# Patient Record
Sex: Male | Born: 1970 | Race: Black or African American | Hispanic: No | Marital: Married
Health system: Southern US, Community
[De-identification: ages and names within clinical notes are randomized; demographics above are authoritative.]

## PROBLEM LIST (undated history)

## (undated) DIAGNOSIS — R569 Unspecified convulsions: Secondary | ICD-10-CM

## (undated) DIAGNOSIS — R251 Tremor, unspecified: Secondary | ICD-10-CM

## (undated) DIAGNOSIS — I10 Essential (primary) hypertension: Secondary | ICD-10-CM

## (undated) DIAGNOSIS — F1020 Alcohol dependence, uncomplicated: Secondary | ICD-10-CM

## (undated) HISTORY — PX: OTHER SURGICAL HISTORY: SHX169

## (undated) HISTORY — PX: HAND SURGERY: SHX662

## (undated) HISTORY — PX: KNEE SURGERY: SHX244

---

## 2004-11-25 ENCOUNTER — Emergency Department (HOSPITAL_COMMUNITY): Admission: EM | Admit: 2004-11-25 | Discharge: 2004-11-25 | Payer: Self-pay | Admitting: Emergency Medicine

## 2005-11-08 ENCOUNTER — Emergency Department (HOSPITAL_COMMUNITY): Admission: EM | Admit: 2005-11-08 | Discharge: 2005-11-08 | Payer: Self-pay | Admitting: *Deleted

## 2005-11-09 ENCOUNTER — Emergency Department (HOSPITAL_COMMUNITY): Admission: EM | Admit: 2005-11-09 | Discharge: 2005-11-10 | Payer: Self-pay | Admitting: Emergency Medicine

## 2011-04-06 ENCOUNTER — Emergency Department (HOSPITAL_COMMUNITY)
Admission: EM | Admit: 2011-04-06 | Discharge: 2011-04-06 | Disposition: A | Discharge status: Home / Self Care | Payer: Self-pay | Attending: Emergency Medicine | Admitting: Emergency Medicine

## 2011-04-06 DIAGNOSIS — L989 Disorder of the skin and subcutaneous tissue, unspecified: Secondary | ICD-10-CM | POA: Insufficient documentation

## 2012-03-16 ENCOUNTER — Encounter (HOSPITAL_COMMUNITY): Payer: Self-pay | Admitting: *Deleted

## 2012-03-16 ENCOUNTER — Emergency Department (HOSPITAL_COMMUNITY)
Admission: EM | Admit: 2012-03-16 | Discharge: 2012-03-17 | Disposition: A | Discharge status: Rehabilitation Facility | Payer: Self-pay | Attending: Emergency Medicine | Admitting: Emergency Medicine

## 2012-03-16 DIAGNOSIS — F10929 Alcohol use, unspecified with intoxication, unspecified: Secondary | ICD-10-CM

## 2012-03-16 DIAGNOSIS — F1010 Alcohol abuse, uncomplicated: Secondary | ICD-10-CM | POA: Insufficient documentation

## 2012-03-16 HISTORY — DX: Alcohol dependence, uncomplicated: F10.20

## 2012-03-16 LAB — COMPREHENSIVE METABOLIC PANEL
ALT: 27 U/L (ref 0–53)
AST: 34 U/L (ref 0–37)
Albumin: 4.4 g/dL (ref 3.5–5.2)
Alkaline Phosphatase: 99 U/L (ref 39–117)
BUN: 9 mg/dL (ref 6–23)
CO2: 21 mEq/L (ref 19–32)
Calcium: 9.1 mg/dL (ref 8.4–10.5)
Chloride: 100 mEq/L (ref 96–112)
Creatinine, Ser: 1.03 mg/dL (ref 0.50–1.35)
GFR calc Af Amer: >90 mL/min (ref >90)
GFR calc non Af Amer: 88 mL/min — ABNORMAL LOW (ref >90)
Glucose, Bld: 92 mg/dL (ref 70–99)
Potassium: 4.2 mEq/L (ref 3.5–5.1)
Sodium: 140 mEq/L (ref 135–145)
Total Bilirubin: 0.2 mg/dL — ABNORMAL LOW (ref 0.3–1.2)
Total Protein: 8.3 g/dL (ref 6.0–8.3)

## 2012-03-16 LAB — CBC
HCT: 44.7 % (ref 39.0–52.0)
Hemoglobin: 15.2 g/dL (ref 13.0–17.0)
MCH: 28.9 pg (ref 26.0–34.0)
MCHC: 34 g/dL (ref 30.0–36.0)
MCV: 85 fL (ref 78.0–100.0)
Platelets: 357 10*3/uL (ref 150–400)
RBC: 5.26 MIL/uL (ref 4.22–5.81)
RDW: 13.5 % (ref 11.5–15.5)
WBC: 8.4 10*3/uL (ref 4.0–10.5)

## 2012-03-16 LAB — RAPID URINE DRUG SCREEN, HOSP PERFORMED
Amphetamines: NOT DETECTED
Barbiturates: NOT DETECTED
Benzodiazepines: NOT DETECTED
Cocaine: POSITIVE — AB
Opiates: NOT DETECTED
Tetrahydrocannabinol: POSITIVE — AB

## 2012-03-16 LAB — ACETAMINOPHEN LEVEL: Acetaminophen (Tylenol), Serum: <15 ug/mL (ref 10–30)

## 2012-03-16 LAB — ETHANOL: Alcohol, Ethyl (B): 271 mg/dL — ABNORMAL HIGH (ref 0–11)

## 2012-03-16 NOTE — ED Provider Notes (Signed)
History    41 year old male with a history of long-standing alcohol abuse. He is in today requesting detox. He says that "enough is enough." Denies any acute stressors which prompted to come to the ER today. Denies any drug use aside from occasional marijuana. No suicidal or homicidal ideation. States that he has drank almost daily for 15 years. No history of seizures when he tries stopping. Last drink was shortly before arrival.  CSN: [id number]  Arrival date & time 03/16/12  1916   First MD Initiated Contact with Patient 03/16/12 2052      Chief Complaint  Patient presents with  . Alcohol Intoxication    detox ,  last drink one hour ago    (Consider location/radiation/quality/duration/timing/severity/associated sxs/prior treatment) HPI  Past Medical History  Diagnosis Date  . Alcoholic     Past Surgical History  Procedure Date  . Hand surgery   . Knee surgery   . Dislocated hip     History reviewed. No pertinent family history.  History  Substance Use Topics  . Smoking status: Not on file  . Smokeless tobacco: Not on file  . Alcohol Use: Yes     5th alcohol for at least 15 years      Review of Systems   Review of symptoms negative unless otherwise noted in HPI.   Allergies  Review of patient's allergies indicates no known allergies.  Home Medications  No current outpatient prescriptions on file.  BP 141/87  Pulse 91  Temp(Src) 98.2 F (36.8 C) (Oral)  Resp 20  SpO2 97%  Physical Exam  Nursing note and vitals reviewed. Constitutional: He is oriented to person, place, and time. He appears well-developed and well-nourished. No distress.       Sitting up in bed. Acute distress. Alcohol and breath.  HENT:  Head: Normocephalic and atraumatic.  Eyes: Conjunctivae are normal. Pupils are equal, round, and reactive to light. Right eye exhibits no discharge. Left eye exhibits no discharge.  Neck: Neck supple.  Cardiovascular: Normal rate, regular rhythm and  normal heart sounds.  Exam reveals no gallop and no friction rub.   No murmur heard. Pulmonary/Chest: Effort normal and breath sounds normal. No respiratory distress.  Abdominal: Soft. He exhibits no distension. There is no tenderness.  Musculoskeletal: He exhibits no edema and no tenderness.  Neurological: He is alert and oriented to person, place, and time. No cranial nerve deficit. He exhibits normal muscle tone. Coordination normal.       Speech is clear and content is appropriate. Good heel to shin and finger nose testing bilaterally. Gait is steady.  Skin: Skin is warm and dry. He is not diaphoretic.  Psychiatric: He has a normal mood and affect. His behavior is normal. Thought content normal.    ED Course  Procedures (including critical care time)  Labs Reviewed  COMPREHENSIVE METABOLIC PANEL - Abnormal; Notable for the following:    Total Bilirubin 0.2 (*)    GFR calc non Af Amer 88 (*)    All other components within normal limits  ETHANOL - Abnormal; Notable for the following:    Alcohol, Ethyl (B) 271 (*)    All other components within normal limits  URINE RAPID DRUG SCREEN (HOSP PERFORMED) - Abnormal; Notable for the following:    Cocaine POSITIVE (*)    Tetrahydrocannabinol POSITIVE (*)    All other components within normal limits  CBC  ACETAMINOPHEN LEVEL   No results found.   1. Alcohol abuse   2.  Alcohol intoxication       MDM  41 year old male with alcohol abuse and acute intoxication. No SI or HI. No evidence of psychosis. No indication to involuntarily commitment.  Discussed with [facility]. Will eval pt.        Raeford Razor, MD 03/16/12 [phone]

## 2012-03-17 MED ORDER — LORAZEPAM 1 MG PO TABS
1.0000 mg | ORAL_TABLET | Freq: Four times a day (QID) | ORAL | Status: DC | PRN
  Administered 2012-03-17: 1 mg via ORAL
  Filled 2012-03-17: qty 1

## 2012-03-17 MED ORDER — THIAMINE HCL 100 MG/ML IJ SOLN: 100.0000 mg | Freq: Every day | INTRAMUSCULAR | Status: DC

## 2012-03-17 MED ORDER — ALUM & MAG HYDROXIDE-SIMETH 200-200-20 MG/5ML PO SUSP: 30.0000 mL | ORAL | Status: DC | PRN

## 2012-03-17 MED ORDER — LORAZEPAM 1 MG PO TABS: 0.0000 mg | ORAL_TABLET | Freq: Two times a day (BID) | ORAL | Status: DC

## 2012-03-17 MED ORDER — ZOLPIDEM TARTRATE 5 MG PO TABS: 5.0000 mg | ORAL_TABLET | Freq: Every evening | ORAL | Status: DC | PRN

## 2012-03-17 MED ORDER — IBUPROFEN 600 MG PO TABS: 600.0000 mg | ORAL_TABLET | Freq: Three times a day (TID) | ORAL | Status: DC | PRN

## 2012-03-17 MED ORDER — NICOTINE 21 MG/24HR TD PT24: 21.0000 mg | MEDICATED_PATCH | Freq: Every day | TRANSDERMAL | Status: DC

## 2012-03-17 MED ORDER — LORAZEPAM 1 MG PO TABS: 1.0000 mg | ORAL_TABLET | Freq: Three times a day (TID) | ORAL | Status: DC | PRN

## 2012-03-17 MED ORDER — ADULT MULTIVITAMIN W/MINERALS CH
1.0000 | ORAL_TABLET | Freq: Every day | ORAL | Status: DC
  Administered 2012-03-17: 1 via ORAL
  Filled 2012-03-17: qty 1

## 2012-03-17 MED ORDER — ONDANSETRON HCL 4 MG PO TABS: 4.0000 mg | ORAL_TABLET | Freq: Three times a day (TID) | ORAL | Status: DC | PRN

## 2012-03-17 MED ORDER — VITAMIN B-1 100 MG PO TABS
100.0000 mg | ORAL_TABLET | Freq: Every day | ORAL | Status: DC
  Administered 2012-03-17: 100 mg via ORAL
  Filled 2012-03-17: qty 1

## 2012-03-17 MED ORDER — FOLIC ACID 1 MG PO TABS
1.0000 mg | ORAL_TABLET | Freq: Every day | ORAL | Status: DC
  Administered 2012-03-17: 1 mg via ORAL
  Filled 2012-03-17: qty 1

## 2012-03-17 MED ORDER — LORAZEPAM 1 MG PO TABS
0.0000 mg | ORAL_TABLET | Freq: Four times a day (QID) | ORAL | Status: DC
  Administered 2012-03-17: 1 mg via ORAL
  Filled 2012-03-17: qty 1

## 2012-03-17 MED ORDER — LORAZEPAM 2 MG/ML IJ SOLN: 1.0000 mg | Freq: Four times a day (QID) | INTRAMUSCULAR | Status: DC | PRN

## 2012-03-17 NOTE — ED Notes (Signed)
Pt discharged with wife. He is to go to [facility] directly from here. Denies SI/HI. Report given to nurse at [facility]. Instructions reviewed with pt and he verbalizes understanding.

## 2012-03-17 NOTE — ED Notes (Signed)
Pt reports some nausea and hot flashes.  Pt declined medication for nausea at this time, po fluids encouraged

## 2012-03-17 NOTE — ED Notes (Signed)
Spoke with Delice Bison at [facility]. She stated they would accept pt after their shift change this evening. They request for him to arrive around 8 p.m. Pt is to have his wife drive him there immediately after discharge from here. He agrees to go directly there. Bobby with [facility] to give him directions.

## 2012-03-17 NOTE — Discharge Instructions (Signed)
Go to [facility]

## 2012-03-17 NOTE — ED Notes (Signed)
Up to the bathroom 

## 2012-03-17 NOTE — ED Notes (Addendum)
Pt's daughter brought him new, unopened denture cream. Cream placed in pt's medication drawer.

## 2012-03-17 NOTE — BH Assessment (Signed)
Assessment Note   Xavier Owens is a 41 y.o. male who presents to [facility] voluntarily, requesting detox from alcohol. Pt states he drinks 1 pint to 1/5th Vodka daily. Pt reports he has been drinking "longer than you've (referring to this Clinical research associate) been alive." He then clarified he has been drinking daily for at least 15 years. He states he has had a "a few sober days" in that time. He does not identify any current stressors or reason for seeking treatment today. He states he "needs to stop" and "its killing me." Pt reports that he is motivated to change. He reports no prior detox attempts and no prior mental health treatment. He admits to occasional THC use but denies any other SA.  Pt denies any current or past SI, HI, and AHVH. He endorses some depression, stating he sometimes feels hopeless. Pt reports that he lives with a friend and can return current living situation after treatment. He states his friend and daughter are very supportive and will be supportive of him seeking treatment.   Axis I: Alcohol Dependence Axis II: Deferred Axis III:  Past Medical History  Diagnosis Date  . Alcoholic    Axis IV: other psychosocial or environmental problems Axis V: 41-50 serious symptoms  Past Medical History:  Past Medical History  Diagnosis Date  . Alcoholic     Past Surgical History  Procedure Date  . Hand surgery   . Knee surgery   . Dislocated hip     Family History: History reviewed. No pertinent family history.  Social History:  does not have a smoking history on file. He does not have any smokeless tobacco history on file. He reports that he drinks alcohol. He reports that he uses illicit drugs (Marijuana).  Additional Social History:  Alcohol / Drug Use History of alcohol / drug use?: Yes Substance #1 Name of Substance 1: alcohol 1 - Age of First Use: teens 1 - Amount (size/oz): 1 pint to 1/5th of Vodka 1 - Frequency: daily 1 - Duration: states at least 15 years 1 - Last  Use / Amount: 03/16/12 Substance #2 Name of Substance 2: THC 2 - Frequency: occasional use 2 - Duration: reports years Substance #3 Name of Substance 3: cocaine 3 - Frequency: pt denies but UDS is positive Allergies: No Known Allergies  Home Medications:  (Not in a hospital admission)  OB/GYN Status:  No LMP for male patient.  General Assessment Data Location of Assessment: [facility] ED Living Arrangements: Non-relatives/Friends Can pt return to current living arrangement?: Yes Admission Status: Voluntary Is patient capable of signing voluntary admission?: Yes Transfer from: Acute Hospital Referral Source: Self/Family/Friend  Education Status Is patient currently in school?: No Highest grade of school patient has completed: 11  Risk to self Suicidal Ideation: No Suicidal Intent: No Is patient at risk for suicide?: No Suicidal Plan?: No Access to Means: No What has been your use of drugs/alcohol within the last 12 months?: alcohol, THC, and cocaine Previous Attempts/Gestures: No How many times?: 0  Other Self Harm Risks: none Triggers for Past Attempts: None known Intentional Self Injurious Behavior: None Family Suicide History: No Recent stressful life event(s):  (none known) Persecutory voices/beliefs?: No Depression: Yes Depression Symptoms: Loss of interest in usual pleasures Substance abuse history and/or treatment for substance abuse?: Yes Suicide prevention information given to non-admitted patients: Not applicable  Risk to Others Homicidal Ideation: No Thoughts of Harm to Others: No Current Homicidal Intent: No Current Homicidal Plan: No Access to Homicidal  Means: No Identified Victim: none History of harm to others?: No Assessment of Violence: None Noted Violent Behavior Description: cooperative Does patient have access to weapons?: No Criminal Charges Pending?: No Does patient have a court date: No  Psychosis Hallucinations: None noted Delusions: None  noted  Mental Status Report Appear/Hygiene: Disheveled Eye Contact: Good Motor Activity: Unremarkable Speech: Logical/coherent Level of Consciousness: Quiet/awake Mood: Anxious Affect: Appropriate to circumstance Anxiety Level: None Thought Processes: Coherent;Relevant Judgement: Impaired Orientation: Person;Place;Time;Situation Obsessive Compulsive Thoughts/Behaviors: None  Cognitive Functioning Concentration: Normal Memory: Recent Intact;Remote Intact IQ: Average Insight: Poor Impulse Control: Poor Appetite: Fair Weight Loss: 0  Weight Gain: 0  Sleep: Decreased Vegetative Symptoms: None  Prior Inpatient Therapy Prior Inpatient Therapy: No Prior Therapy Dates: n/a Prior Therapy Facilty/Provider(s): n/a Reason for Treatment: n/a  Prior Outpatient Therapy Prior Outpatient Therapy: No Prior Therapy Dates: n/a Prior Therapy Facilty/Provider(s): n/a Reason for Treatment: n/a  ADL Screening (condition at time of admission) Patient's cognitive ability adequate to safely complete daily activities?: Yes Patient able to express need for assistance with ADLs?: Yes Independently performs ADLs?: Yes Weakness of Legs: None Weakness of Arms/Hands: None  Home Assistive Devices/Equipment Home Assistive Devices/Equipment: None    Abuse/Neglect Assessment (Assessment to be complete while patient is alone) Physical Abuse: Denies Verbal Abuse: Denies Sexual Abuse: Denies Exploitation of patient/patient's resources: Denies Self-Neglect: Denies Values / Beliefs Cultural Requests During Hospitalization: None Spiritual Requests During Hospitalization: None   Advance Directives (For Healthcare) Advance Directive: Patient does not have advance directive;Patient would not like information Pre-existing out of facility DNR order (yellow form or pink MOST form): No Nutrition Screen Diet: Regular Unintentional weight loss greater than 10lbs within the last month: No Problems  chewing or swallowing foods and/or liquids: No Home Tube Feeding or Total Parenteral Nutrition (TPN): No Patient appears severely malnourished: No  Additional Information 1:1 In Past 12 Months?: No CIRT Risk: No Elopement Risk: No Does patient have medical clearance?: Yes     Disposition:  Disposition Disposition of Patient: Referred to;Inpatient treatment program Type of inpatient treatment program: Adult  On Site Evaluation by:   Reviewed with Physician:     Georgina Quint A 03/17/2012 12:45 AM

## 2012-03-17 NOTE — BHH Counselor (Signed)
Pt accepted to [facility] in [place] and will transport self via family.  [facility] is in agreement with plan.

## 2012-03-17 NOTE — ED Notes (Signed)
Wife brought pt clothes from home. Belongings checked and placed in [address] in [place].

## 2012-03-17 NOTE — ED Provider Notes (Signed)
BP 127/76  Pulse 76  Temp(Src) 97.8 F (36.6 C) (Oral)  Resp 16  SpO2 95%  Patient seen and evaluated by me. No complaints at this time. Awaiting placement.   Forbes Cellar, MD 03/17/12 [phone]

## 2012-03-17 NOTE — ED Notes (Signed)
Up tot he bathroom to shower and change scrubs 

## 2012-03-17 NOTE — ED Notes (Signed)
Up to the desk in the  phone

## 2012-03-17 NOTE — ED Notes (Signed)
Dr webb into see 

## 2012-03-17 NOTE — ED Notes (Signed)
Pt states he came to ED to get detox from ETOH. Stressors include father-in-law dying of cancer and financial problems. Admits to smoking MJ occasionally. Denies using cocaine but admits to distributing it and therefore it may show in his UDS. He states he has been drinking a fifth of liquor or more daily recently and has been drinking daily for 20 years. Denies SI/HI or A/VH. Discussed rehab with pt. He states he wants to get off ETOH and wants to be cooperative with treatment program. No evidence of withdrawal presently. Will continue to monitor.

## 2014-03-28 ENCOUNTER — Emergency Department (HOSPITAL_COMMUNITY)
Admission: EM | Admit: 2014-03-28 | Discharge: 2014-03-29 | Disposition: A | Discharge status: Home / Self Care | Payer: No Typology Code available for payment source | Attending: Emergency Medicine | Admitting: Emergency Medicine

## 2014-03-28 ENCOUNTER — Emergency Department (HOSPITAL_COMMUNITY): Payer: No Typology Code available for payment source

## 2014-03-28 ENCOUNTER — Encounter (HOSPITAL_COMMUNITY): Payer: Self-pay | Admitting: Emergency Medicine

## 2014-03-28 DIAGNOSIS — S20219A Contusion of unspecified front wall of thorax, initial encounter: Secondary | ICD-10-CM | POA: Insufficient documentation

## 2014-03-28 DIAGNOSIS — F1010 Alcohol abuse, uncomplicated: Secondary | ICD-10-CM | POA: Insufficient documentation

## 2014-03-28 DIAGNOSIS — W11XXXA Fall on and from ladder, initial encounter: Secondary | ICD-10-CM | POA: Insufficient documentation

## 2014-03-28 DIAGNOSIS — Y9389 Activity, other specified: Secondary | ICD-10-CM | POA: Insufficient documentation

## 2014-03-28 DIAGNOSIS — F17200 Nicotine dependence, unspecified, uncomplicated: Secondary | ICD-10-CM | POA: Insufficient documentation

## 2014-03-28 DIAGNOSIS — Y929 Unspecified place or not applicable: Secondary | ICD-10-CM | POA: Insufficient documentation

## 2014-03-28 DIAGNOSIS — R1909 Other intra-abdominal and pelvic swelling, mass and lump: Secondary | ICD-10-CM | POA: Insufficient documentation

## 2014-03-28 LAB — I-STAT TROPONIN, ED: TROPONIN I, POC: 0 ng/mL (ref 0.00–0.08)

## 2014-03-28 NOTE — Discharge Instructions (Signed)
Chest Contusion °A chest contusion is a deep bruise on your chest area. Contusions are the result of an injury that caused bleeding under the skin. A chest contusion may involve bruising of the skin, muscles, or ribs. The contusion may turn blue, purple, or yellow. Minor injuries will give you a painless contusion, but more severe contusions may stay painful and swollen for a few weeks. °CAUSES  °A contusion is usually caused by a blow, trauma, or direct force to an area of the body. °SYMPTOMS  °· Swelling and redness of the injured area. °· Discoloration of the injured area. °· Tenderness and soreness of the injured area. °· Pain. °DIAGNOSIS  °The diagnosis can be made by taking a history and performing a physical exam. An X-ray, CT scan, or MRI may be needed to determine if there were any associated injuries, such as broken bones (fractures) or internal injuries. °TREATMENT  °Often, the best treatment for a chest contusion is resting, icing, and applying cold compresses to the injured area. Deep breathing exercises may be recommended to reduce the risk of pneumonia. Over-the-counter medicines may also be recommended for pain control. °HOME CARE INSTRUCTIONS  °· Put ice on the injured area. °· Put ice in a plastic bag. °· Place a towel between your skin and the bag. °· Leave the ice on for 15-20 minutes, 03-04 times a day. °· Only take over-the-counter or prescription medicines as directed by your caregiver. Your caregiver may recommend avoiding anti-inflammatory medicines (aspirin, ibuprofen, and naproxen) for 48 hours because these medicines may increase bruising. °· Rest the injured area. °· Perform deep-breathing exercises as directed by your caregiver. °· Stop smoking if you smoke. °· Do not lift objects over 5 pounds (2.3 kg) for 3 days or longer if recommended by your caregiver. °SEEK IMMEDIATE MEDICAL CARE IF:  °· You have increased bruising or swelling. °· You have pain that is getting worse. °· You have  difficulty breathing. °· You have dizziness, weakness, or fainting. °· You have blood in your urine or stool. °· You cough up or vomit blood. °· Your swelling or pain is not relieved with medicines. °MAKE SURE YOU:  °· Understand these instructions. °· Will watch your condition. °· Will get help right away if you are not doing well or get worse. °Document Released: 07/26/2001 Document Revised: 07/25/2012 Document Reviewed: 04/23/2012 °[employer]® Patient Information ©2014 [employer]. ° °

## 2014-03-28 NOTE — ED Notes (Addendum)
Per pt report; pt c/o chest pain the began about 3 days ago.  Pain is on left side of his chest. Pt reports pain when he coughs and bends down.  Pt reports shortness of breath.  Pt a/o x 4.  Skin warm and dry. Pt ambulatory in triage.

## 2014-03-28 NOTE — ED Provider Notes (Signed)
CSN: [id number]     Arrival date & time 03/28/14  2207 History   First MD Initiated Contact with Patient 03/28/14 2229     Chief Complaint  Patient presents with  . Chest Pain     (Consider location/radiation/quality/duration/timing/severity/associated sxs/prior Treatment) Patient is a 43 y.o. male presenting with chest pain. The history is provided by the patient.  Chest Pain Associated symptoms: no abdominal pain, no back pain, no headache, no nausea, no numbness, no shortness of breath, not vomiting and no weakness    patient presents with pain in his left chest. States began around 4 days ago when he fell off a short ladder. States he thinks he may have hit his chest. The patient constant. It is worse with pushing nonspecific part of his chest. No trouble breathing. No fevers. No cough. No diaphoresis. He states he drank "3 beers" today. Patient smells of alcohol. He denies hitting his head. He denies numbness or weakness. He denies abdominal pain  Past Medical History  Diagnosis Date  . Alcoholic    Past Surgical History  Procedure Laterality Date  . Hand surgery    . Knee surgery    . Dislocated hip     History reviewed. No pertinent family history. History  Substance Use Topics  . Smoking status: Current Every Day Smoker -- 0.50 packs/day    Types: Cigarettes  . Smokeless tobacco: Not on file  . Alcohol Use: Yes     Comment: 5th alcohol for at least 15 years    Review of Systems  Constitutional: Negative for activity change and appetite change.  Eyes: Negative for pain.  Respiratory: Negative for chest tightness and shortness of breath.   Cardiovascular: Positive for chest pain. Negative for leg swelling.  Gastrointestinal: Negative for nausea, vomiting, abdominal pain and diarrhea.  Genitourinary: Negative for flank pain.  Musculoskeletal: Negative for back pain and neck stiffness.  Skin: Negative for rash.  Neurological: Negative for weakness, numbness and  headaches.  Psychiatric/Behavioral: Negative for behavioral problems.      Allergies  Review of patient's allergies indicates no known allergies.  Home Medications   Prior to Admission medications   Not on File   BP 127/83  Pulse 106  Temp(Src) 98.8 F (37.1 C) (Oral)  Resp 10  Ht 5' 8.5" (1.74 m)  Wt 178 lb (80.74 kg)  BMI 26.67 kg/m2  SpO2 95% Physical Exam  Nursing note and vitals reviewed. Constitutional: He is oriented to person, place, and time. He appears well-developed and well-nourished.  HENT:  Head: Normocephalic and atraumatic.  Eyes: EOM are normal. Pupils are equal, round, and reactive to light.  Neck: Normal range of motion. Neck supple.  Cardiovascular: Regular rhythm and normal heart sounds.   No murmur heard. Pulmonary/Chest: Effort normal and breath sounds normal. He exhibits tenderness.  Some tenderness to left anterior chest wall lateral to the sternum. No crepitus deformity. No subcutaneous emphysema  Abdominal: Soft. Bowel sounds are normal. He exhibits mass. He exhibits no distension. There is no tenderness. There is no rebound and no guarding.  Musculoskeletal: Normal range of motion. He exhibits no edema.  Neurological: He is alert and oriented to person, place, and time. No cranial nerve deficit.  Patient appears intoxicated  Skin: Skin is warm and dry.  Psychiatric: He has a normal mood and affect.    ED Course  Procedures (including critical care time) Labs Review Labs Reviewed  Rosezena SensorI-STAT TROPOININ, ED    Imaging Review Dg Ribs Unilateral  W/chest Left  03/28/2014   CLINICAL DATA:  Chest pain and shortness of breath.  Smoker.  EXAM: LEFT RIBS AND CHEST - 3+ VIEW  COMPARISON:  None.  FINDINGS: Normal sized heart. Clear lungs. No fracture or pneumothorax seen. Minimal scoliosis. Mild central peribronchial thickening.  IMPRESSION: No acute abnormality.  Mild chronic bronchitic changes.   Electronically Signed   By: Gordan PaymentSteve  Reid M.D.   On:  03/28/2014 23:32     EKG Interpretation   Date/Time:  Friday Mar 28 2014 22:11:37 EDT Ventricular Rate:  106 PR Interval:  156 QRS Duration: 99 QT Interval:  352 QTC Calculation: 467 R Axis:   37 Text Interpretation:  Sinus tachycardia Probable anteroseptal infarct, old  Confirmed by Otillia Cordone  MD, Gerrie Castiglia [phone]([id number]) on 03/28/2014 10:30:07 PM      MDM   Final diagnoses:  Chest wall contusion    Patient with left chest pain after trauma. EKG and troponin reassuring. Mild tachycardia. He is somewhat intoxicated, although he is a chronic alcoholic. X-ray does not show pneumothorax or rib fracture. Patient will be discharged home. No narcotic pain medicine due to chronic alcohol use    Juliet RudeNathan R. Rubin PayorPickering, MD 03/28/14 2350

## 2014-03-28 NOTE — ED Notes (Signed)
Pt arrived to the ED with a complaint of chest pain located on the left side of the chest.  Pain doesn't radiate anywhere.  Pain is exacerbated by bending over and at this time he has shortness of breath.  Pt has had pain for a week.  Pt has no PCP. Pt also states he fell off a ladder aa week ago.  Pain is present when he coughs

## 2014-06-09 ENCOUNTER — Emergency Department (HOSPITAL_COMMUNITY)
Admission: EM | Admit: 2014-06-09 | Discharge: 2014-06-09 | Discharge status: Absent without leave | Payer: No Typology Code available for payment source | Source: Home / Self Care

## 2014-06-09 ENCOUNTER — Ambulatory Visit: Payer: No Typology Code available for payment source

## 2015-06-10 ENCOUNTER — Emergency Department (HOSPITAL_COMMUNITY): Payer: Self-pay

## 2015-06-10 ENCOUNTER — Encounter (HOSPITAL_COMMUNITY): Payer: Self-pay | Admitting: Emergency Medicine

## 2015-06-10 ENCOUNTER — Emergency Department (HOSPITAL_COMMUNITY)
Admission: EM | Admit: 2015-06-10 | Discharge: 2015-06-10 | Disposition: A | Discharge status: Home / Self Care | Payer: Self-pay | Attending: Emergency Medicine | Admitting: Emergency Medicine

## 2015-06-10 ENCOUNTER — Emergency Department (HOSPITAL_COMMUNITY): Payer: No Typology Code available for payment source

## 2015-06-10 DIAGNOSIS — R200 Anesthesia of skin: Secondary | ICD-10-CM

## 2015-06-10 DIAGNOSIS — I10 Essential (primary) hypertension: Secondary | ICD-10-CM | POA: Insufficient documentation

## 2015-06-10 DIAGNOSIS — G4489 Other headache syndrome: Secondary | ICD-10-CM | POA: Insufficient documentation

## 2015-06-10 DIAGNOSIS — R079 Chest pain, unspecified: Secondary | ICD-10-CM | POA: Insufficient documentation

## 2015-06-10 DIAGNOSIS — Z720 Tobacco use: Secondary | ICD-10-CM | POA: Insufficient documentation

## 2015-06-10 HISTORY — DX: Essential (primary) hypertension: I10

## 2015-06-10 LAB — CBC
HCT: 42.5 % (ref 39.0–52.0)
Hemoglobin: 14.6 g/dL (ref 13.0–17.0)
MCH: 29.7 pg (ref 26.0–34.0)
MCHC: 34.4 g/dL (ref 30.0–36.0)
MCV: 86.4 fL (ref 78.0–100.0)
PLATELETS: 300 10*3/uL (ref 150–400)
RBC: 4.92 MIL/uL (ref 4.22–5.81)
RDW: 13.1 % (ref 11.5–15.5)
WBC: 8.4 10*3/uL (ref 4.0–10.5)

## 2015-06-10 LAB — I-STAT TROPONIN, ED
Troponin i, poc: 0 ng/mL (ref 0.00–0.08)
Troponin i, poc: 0 ng/mL (ref 0.00–0.08)

## 2015-06-10 LAB — BASIC METABOLIC PANEL
Anion gap: 13 (ref 5–15)
BUN: 11 mg/dL (ref 6–20)
CALCIUM: 9 mg/dL (ref 8.9–10.3)
CHLORIDE: 98 mmol/L — AB (ref 101–111)
CO2: 21 mmol/L — ABNORMAL LOW (ref 22–32)
CREATININE: 1.26 mg/dL — AB (ref 0.61–1.24)
GFR calc non Af Amer: >60 mL/min (ref >60)
GLUCOSE: 102 mg/dL — AB (ref 65–99)
Potassium: 2.9 mmol/L — ABNORMAL LOW (ref 3.5–5.1)
Sodium: 132 mmol/L — ABNORMAL LOW (ref 135–145)

## 2015-06-10 MED ORDER — ASPIRIN 81 MG PO CHEW
324.0000 mg | CHEWABLE_TABLET | Freq: Once | ORAL | Status: AC
  Administered 2015-06-10: 324 mg via ORAL
  Filled 2015-06-10: qty 4

## 2015-06-10 MED ORDER — POTASSIUM CHLORIDE CRYS ER 20 MEQ PO TBCR
40.0000 meq | EXTENDED_RELEASE_TABLET | Freq: Once | ORAL | Status: AC
  Administered 2015-06-10: 40 meq via ORAL
  Filled 2015-06-10: qty 2

## 2015-06-10 MED ORDER — SODIUM CHLORIDE 0.9 % IV BOLUS (SEPSIS)
1000.0000 mL | Freq: Once | INTRAVENOUS | Status: AC
  Administered 2015-06-10: 1000 mL via INTRAVENOUS

## 2015-06-10 MED ORDER — KETOROLAC TROMETHAMINE 30 MG/ML IJ SOLN
30.0000 mg | Freq: Once | INTRAMUSCULAR | Status: AC
  Administered 2015-06-10: 30 mg via INTRAVENOUS
  Filled 2015-06-10: qty 1

## 2015-06-10 MED ORDER — METOCLOPRAMIDE HCL 5 MG/ML IJ SOLN
10.0000 mg | Freq: Once | INTRAMUSCULAR | Status: AC
  Administered 2015-06-10: 10 mg via INTRAVENOUS
  Filled 2015-06-10: qty 2

## 2015-06-10 MED ORDER — DIPHENHYDRAMINE HCL 50 MG/ML IJ SOLN
25.0000 mg | Freq: Once | INTRAMUSCULAR | Status: AC
  Administered 2015-06-10: 25 mg via INTRAVENOUS
  Filled 2015-06-10: qty 1

## 2015-06-10 NOTE — ED Notes (Addendum)
Pt back from MRI and placed back on monitor.  ?

## 2015-06-10 NOTE — ED Notes (Signed)
Pt. reports intermittent central chest pain with palpitations worse with deep inspiration onset yesterday  , mild SOB , denies nausea or diaphoresis .

## 2015-06-10 NOTE — ED Provider Notes (Signed)
CSN: [id number]     Arrival date & time 06/10/15  0234 History   First MD Initiated Contact with Patient 06/10/15 0541     Chief Complaint  Patient presents with  . Chest Pain     (Consider location/radiation/quality/duration/timing/severity/associated sxs/prior Treatment) HPI Comments: Patient is a 44 year old male with a past medical history of alcoholism and hypertension who presents with chest pain that started 3 days ago. Patient reports having some generalized abdominal pain and then felt nauseous and vomiting several times. After vomiting, patient reports headache and chest tightness and abdominal soreness. The symptoms have been persistent since then. No other associated symptoms. After further questioning, patient endorses left arm and left leg numbness for the past week. Patient reports feeling a heaviness in both extremities and states he "cannot feel his leg and arm when he pinches it." No aggravating/alleviating factors. No other associated symptoms.    Past Medical History  Diagnosis Date  . Alcoholic   . Hypertension    Past Surgical History  Procedure Laterality Date  . Hand surgery    . Knee surgery    . Dislocated hip     No family history on file. History  Substance Use Topics  . Smoking status: Current Every Day Smoker -- 0.00 packs/day    Types: Cigarettes  . Smokeless tobacco: Not on file  . Alcohol Use: Yes     Comment: 5th alcohol for at least 15 years    Review of Systems  Cardiovascular: Positive for chest pain.  Neurological: Positive for numbness and headaches.  All other systems reviewed and are negative.     Allergies  Review of patient's allergies indicates no known allergies.  Home Medications   Prior to Admission medications   Not on File   BP 119/70 mmHg  Pulse 88  Temp(Src) 98.3 F (36.8 C) (Oral)  Resp 10  SpO2 98% Physical Exam  Constitutional: He is oriented to person, place, and time. He appears well-developed and  well-nourished. No distress.  HENT:  Head: Normocephalic and atraumatic.  Eyes: Conjunctivae and EOM are normal.  Neck: Normal range of motion.  Cardiovascular: Normal rate and regular rhythm.  Exam reveals no gallop and no friction rub.   No murmur heard. Pulmonary/Chest: Effort normal and breath sounds normal. He has no wheezes. He has no rales. He exhibits no tenderness.  Abdominal: Soft. He exhibits no distension. There is no tenderness. There is no rebound.  Musculoskeletal: Normal range of motion.  Neurological: He is alert and oriented to person, place, and time. No cranial nerve deficit. Coordination normal.  Diminished sensation to light and sharp touch of the left arm and left leg. Speech is goal-oriented. Moves limbs without ataxia.   Skin: Skin is warm and dry.  Psychiatric: He has a normal mood and affect. His behavior is normal.  Nursing note and vitals reviewed.   ED Course  Procedures (including critical care time) Labs Review Labs Reviewed  BASIC METABOLIC PANEL - Abnormal; Notable for the following:    Sodium 132 (*)    Potassium 2.9 (*)    Chloride 98 (*)    CO2 21 (*)    Glucose, Bld 102 (*)    Creatinine, Ser 1.26 (*)    All other components within normal limits  CBC  I-STAT TROPOININ, ED  Rosezena Sensor, ED    Imaging Review Dg Chest 2 View  06/10/2015   CLINICAL DATA:  Chest pain for 1 day.  EXAM: CHEST  2 VIEW  COMPARISON:  03/28/2014  FINDINGS: The cardiomediastinal contours are normal. Pulmonary vasculature is normal. No consolidation, pleural effusion, or pneumothorax. No acute osseous abnormalities are seen.  IMPRESSION: No acute pulmonary process.   Electronically Signed   By: Rubye Oaks M.D.   On: 06/10/2015 02:57     EKG Interpretation   Date/Time:  Wednesday June 10 2015 02:42:29 EDT Ventricular Rate:  101 PR Interval:  146 QRS Duration: 96 QT Interval:  326 QTC Calculation: 422 R Axis:   63 Text Interpretation:  Sinus  tachycardia Otherwise normal ECG No  significant change since last tracing artifact noted in this EKG Confirmed  by Bebe Shaggy  MD, Dorinda Hill ([id number]) on 06/10/2015 4:53:18 AM      MDM   Final diagnoses:  Other headache syndrome  Chest pain, unspecified chest pain type  Left sided numbness    7:00 AM Patient will have CT head to evaluate for left arm and leg numbness. Repeat troponin pending.   Repeat troponin unremarkable. CT head and MRI brain unremarkable. Patient will have recommended follow up with PCP. Patient instructed to return with worsening or concerning symptoms.    Emilia Beck, [name] 06/10/15 1355  Blane Ohara, MD 06/13/15 [phone]

## 2015-06-10 NOTE — ED Notes (Signed)
In room to start IV.  Provider at the bedside.  Requested that meds be held until after CT scan.

## 2015-06-10 NOTE — Discharge Instructions (Signed)
Return to the ED with worsening or concerning symptoms. Refer to attached documents for more information.  °

## 2015-08-28 ENCOUNTER — Emergency Department (HOSPITAL_COMMUNITY)
Admission: EM | Admit: 2015-08-28 | Discharge: 2015-08-29 | Disposition: A | Discharge status: Home / Self Care | Payer: No Typology Code available for payment source | Attending: Emergency Medicine | Admitting: Emergency Medicine

## 2015-08-28 ENCOUNTER — Encounter (HOSPITAL_COMMUNITY): Payer: Self-pay | Admitting: Emergency Medicine

## 2015-08-28 DIAGNOSIS — Z720 Tobacco use: Secondary | ICD-10-CM | POA: Insufficient documentation

## 2015-08-28 DIAGNOSIS — I10 Essential (primary) hypertension: Secondary | ICD-10-CM | POA: Insufficient documentation

## 2015-08-28 DIAGNOSIS — Z791 Long term (current) use of non-steroidal anti-inflammatories (NSAID): Secondary | ICD-10-CM | POA: Insufficient documentation

## 2015-08-28 DIAGNOSIS — Z87828 Personal history of other (healed) physical injury and trauma: Secondary | ICD-10-CM | POA: Insufficient documentation

## 2015-08-28 DIAGNOSIS — R200 Anesthesia of skin: Secondary | ICD-10-CM | POA: Insufficient documentation

## 2015-08-28 DIAGNOSIS — M5432 Sciatica, left side: Secondary | ICD-10-CM | POA: Insufficient documentation

## 2015-08-28 NOTE — ED Notes (Signed)
Patient c/o left leg pain. Patient states he was [facility], they did MRI and CT, couldn't find anything wrong. Patient states he has numbness in his calf, and pain in his inner thigh. "If I had an axe I would just cut it off.".

## 2015-08-28 NOTE — ED Provider Notes (Signed)
CSN: [id number]     Arrival date & time 08/28/15  2309 History  By signing my name below, I, Xavier Owens, attest that this documentation has been prepared under the direction and in the presence of Rolland PorterMark Lincoln Kleiner, MD. Electronically Signed: Phillis HaggisGabriella Owens, ED Scribe. 08/28/2015. 12:03 AM.  Chief Complaint  Patient presents with  . Leg Pain    left   The history is provided by the patient. No language interpreter was used.  HPI Comments: Xavier Owens is a 44 y.o. Male with hx of HTN who presents to the Emergency Department complaining of sharp, cramping, constant, radiating left leg pain and numbness onset 3 months ago. He reports that the pain starts in his left iliac crest and groin area. He states that the numbness is in his left calf area. He reports trouble ambulating and has worsening pain with weight bearing. He states that he was seen at [facility] 3 months ago with MRI and CT scan performed with no diagnoses for his leg pain. Pt reports a past hx of hip dislocation from football.   Past Medical History  Diagnosis Date  . Alcoholic (HCC)   . Hypertension    Past Surgical History  Procedure Laterality Date  . Hand surgery    . Knee surgery    . Dislocated hip     History reviewed. No pertinent family history. Social History  Substance Use Topics  . Smoking status: Current Every Day Smoker -- 0.30 packs/day    Types: Cigarettes  . Smokeless tobacco: None  . Alcohol Use: Yes     Comment: 5th alcohol for at least 15 years, 08/28/2015 3 beers & 1/2 pint liquor daily    Review of Systems  Constitutional: Negative for chills, diaphoresis and appetite change.  HENT: Negative for mouth sores, sore throat and trouble swallowing.   Eyes: Negative for visual disturbance.  Respiratory: Negative for cough, chest tightness, shortness of breath and wheezing.   Cardiovascular: Negative for chest pain.  Gastrointestinal: Negative for nausea, vomiting, abdominal pain, diarrhea and  abdominal distention.  Endocrine: Negative for polydipsia, polyphagia and polyuria.  Genitourinary: Negative for dysuria, frequency and hematuria.  Musculoskeletal: Positive for arthralgias and gait problem.  Skin: Negative for color change, pallor and rash.  Neurological: Positive for numbness. Negative for dizziness, syncope, light-headedness and headaches.  Hematological: Does not bruise/bleed easily.  Psychiatric/Behavioral: Negative for behavioral problems and confusion.   Allergies  Review of patient's allergies indicates no known allergies.  Home Medications   Prior to Admission medications   Medication Sig Start Date End Date Taking? Authorizing Provider  ibuprofen (ADVIL,MOTRIN) 800 MG tablet Take 800 mg by mouth every 8 (eight) hours as needed (for pain.).   Yes Historical Provider, MD  methylPREDNISolone (MEDROL DOSEPAK) 4 MG TBPK tablet 6 po on day 1, then decrease by 1 per day 08/29/15   Rolland PorterMark Sherra Kimmons, MD  naproxen (NAPROSYN) 500 MG tablet Take 1 tablet (500 mg total) by mouth 2 (two) times daily. 08/29/15   Rolland PorterMark Teyon Odette, MD   BP 134/80 mmHg  Pulse 91  Temp(Src) 97.8 F (36.6 C) (Oral)  Resp 18  Ht 5\' 8"  (1.727 m)  Wt 165 lb (74.844 kg)  BMI 25.09 kg/m2  SpO2 97%  Physical Exam  Constitutional: He is oriented to person, place, and time. He appears well-developed and well-nourished. No distress.  HENT:  Head: Normocephalic.  Eyes: Conjunctivae are normal. Pupils are equal, round, and reactive to light. No scleral icterus.  Neck: Normal  range of motion. Neck supple. No thyromegaly present.  Cardiovascular: Normal rate and regular rhythm.  Exam reveals no gallop and no friction rub.   No murmur heard. Pulmonary/Chest: Effort normal and breath sounds normal. No respiratory distress. He has no wheezes. He has no rales.  Abdominal: Soft. Bowel sounds are normal. He exhibits no distension. There is no tenderness. There is no rebound.  Musculoskeletal: Normal range of motion.   Tenderness to left iliac crest and left buttock; Non-tender along the left medial thigh; normal sensation and normal reflexes  Neurological: He is alert and oriented to person, place, and time.  Skin: Skin is warm and dry. No rash noted.  Psychiatric: He has a normal mood and affect. His behavior is normal.    ED Course  Procedures (including critical care time) DIAGNOSTIC STUDIES: Oxygen Saturation is 97% on RA, normal by my interpretation.    COORDINATION OF CARE: 12:00 AM-Discussed treatment plan which includes anti-inflammatories and steroids with physical therapy referral with pt at bedside and pt agreed to plan.   Labs Review Labs Reviewed - No data to display  Imaging Review No results found. I have personally reviewed and evaluated these images and lab results as part of my medical decision-making.   EKG Interpretation None      MDM   Final diagnoses:  Sciatica of left side    Medical screening examination/treatment/procedure(s) were performed by non-physician practitioner and as supervising physician I was immediately available for consultation/collaboration.   EKG Interpretation None      Patient with normal neurovascular exam. Ambulatory. Clinically sciatica. No additional concerns on exam or history. No red flag findings or history    [name], MD 09/11/15 1517

## 2015-08-29 MED ORDER — PREDNISONE 20 MG PO TABS
60.0000 mg | ORAL_TABLET | Freq: Once | ORAL | Status: AC
  Administered 2015-08-29: 60 mg via ORAL
  Filled 2015-08-29: qty 3

## 2015-08-29 MED ORDER — METHYLPREDNISOLONE 4 MG PO TBPK: ORAL_TABLET | ORAL | Status: DC

## 2015-08-29 MED ORDER — NAPROXEN 500 MG PO TABS: 500.0000 mg | ORAL_TABLET | Freq: Two times a day (BID) | ORAL | Status: DC

## 2015-08-29 NOTE — ED Notes (Signed)
Pt. Requested to speak to MD before leaving .

## 2015-08-29 NOTE — Discharge Instructions (Signed)
Physical therapy referral.--Take to any PT clinic for treatment. (call for appointment first.) [facility] our patient PT:  [phone]   Sciatica Sciatica is pain, weakness, numbness, or tingling along your sciatic nerve. The nerve starts in the lower back and runs down the back of each leg. Nerve damage or certain conditions pinch or put pressure on the sciatic nerve. This causes the pain, weakness, and other discomforts of sciatica. HOME CARE   Only take medicine as told by your doctor.  Apply ice to the affected area for 20 minutes. Do this 3-4 times a day for the first 48-72 hours. Then try heat in the same way.  Exercise, stretch, or do your usual activities if these do not make your pain worse.  Go to physical therapy as told by your doctor.  Keep all doctor visits as told.  Do not wear high heels or shoes that are not supportive.  Get a firm mattress if your mattress is too soft to lessen pain and discomfort. GET HELP RIGHT AWAY IF:   You cannot control when you poop (bowel movement) or pee (urinate).  You have more weakness in your lower back, lower belly (pelvis), butt (buttocks), or legs.  You have redness or puffiness (swelling) of your back.  You have a burning feeling when you pee.  You have pain that gets worse when you lie down.  You have pain that wakes you from your sleep.  Your pain is worse than past pain.  Your pain lasts longer than 4 weeks.  You are suddenly losing weight without reason. MAKE SURE YOU:   Understand these instructions.  Will watch this condition.  Will get help right away if you are not doing well or get worse.   This information is not intended to replace advice given to you by your health care provider. Make sure you discuss any questions you have with your health care provider.   Document Released: 08/09/2008 Document Revised: 07/22/2015 Document Reviewed: 03/11/2012 Elsevier Interactive Patient Education [employer]2016 [employer]vier Inc.

## 2015-09-23 ENCOUNTER — Emergency Department (HOSPITAL_COMMUNITY)
Admission: EM | Admit: 2015-09-23 | Discharge: 2015-09-23 | Disposition: A | Discharge status: Home / Self Care | Payer: No Typology Code available for payment source | Attending: Emergency Medicine | Admitting: Emergency Medicine

## 2015-09-23 ENCOUNTER — Emergency Department (HOSPITAL_COMMUNITY): Payer: No Typology Code available for payment source

## 2015-09-23 DIAGNOSIS — Z791 Long term (current) use of non-steroidal anti-inflammatories (NSAID): Secondary | ICD-10-CM | POA: Insufficient documentation

## 2015-09-23 DIAGNOSIS — Z720 Tobacco use: Secondary | ICD-10-CM | POA: Insufficient documentation

## 2015-09-23 DIAGNOSIS — M25552 Pain in left hip: Secondary | ICD-10-CM | POA: Insufficient documentation

## 2015-09-23 DIAGNOSIS — Z7952 Long term (current) use of systemic steroids: Secondary | ICD-10-CM | POA: Insufficient documentation

## 2015-09-23 DIAGNOSIS — M79605 Pain in left leg: Secondary | ICD-10-CM | POA: Insufficient documentation

## 2015-09-23 DIAGNOSIS — I10 Essential (primary) hypertension: Secondary | ICD-10-CM | POA: Insufficient documentation

## 2015-09-23 MED ORDER — NAPROXEN 500 MG PO TABS: 500.0000 mg | ORAL_TABLET | Freq: Three times a day (TID) | ORAL | Status: DC

## 2015-09-23 NOTE — Discharge Instructions (Signed)

## 2015-09-23 NOTE — ED Notes (Signed)
Pt states that he has had a hx of problems with sciatica but feels like something more is wrong. States that his pain is in his back and L leg. Alert and oriented.

## 2015-09-23 NOTE — ED Notes (Signed)
Bed: WTR6 Expected date:  Expected time:  Means of arrival:  Comments: Room 40

## 2015-09-23 NOTE — ED Provider Notes (Signed)
CSN: [id number]     Arrival date & time 09/23/15  1703 History  By signing my name below, I, [name], attest that this documentation has been prepared under the direction and in the presence of Arthor Captain, [name] Electronically Signed: [name], ED Scribe. 09/23/2015. 7:44 PM.   Chief Complaint  Patient presents with  . Leg Pain      The history is provided by the patient. No language interpreter was used.    Xavier Owens is a 44 y.o. male with a medical hx of sciatica and HTN who presents to the Emergency Department complaining of moderate, intermittent, sharp, 10/10, left leg pain onset 4 months ago. He notes that his left leg pain radiates down to his left toes. He states that his pain is worsened with movement and that he feels the sharp "ice pick" sensation in his left hip. Pt had a knee replacement with Dr. Thurston Hole 20 years ago, but he currently doesn't have an orthopedist. He states that when he played sports in highschool he dislocated his left hip and he denies having a hip xray at this time. He reports that he has had a MRI of his lumbar spine and it returned negative. He denies hx of CAD or peripheral vascular disease at this time. He notes that she smokes 1 pack of cigarettes every 3 days. Pt is having associated symptoms of left hip pain. He notes that he has not tried any medications for the relief of his symptoms. He denies color change, wound, rash, joint swelling, and any other symptoms. 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   Past Medical History  Diagnosis Date  . Alcoholic (HCC)   . Hypertension    Past Surgical History  Procedure Laterality Date  . Hand surgery    . Knee surgery    . Dislocated hip     No family history on file. Social History  Substance Use Topics  . Smoking status: Current Every Day Smoker -- 0.30 packs/day    Types: Cigarettes  . Smokeless tobacco: Not on file  . Alcohol Use: Yes     Comment: 5th alcohol for at least 15 years,  08/28/2015 3 beers & 1/2 pint liquor daily    Review of Systems  Musculoskeletal: Positive for arthralgias (left hip). Negative for joint swelling and gait problem.  Skin: Negative for color change, rash and wound.      Allergies  Review of patient's allergies indicates no known allergies.  Home Medications   Prior to Admission medications   Medication Sig Start Date End Date Taking? Authorizing Provider  ibuprofen (ADVIL,MOTRIN) 800 MG tablet Take 800 mg by mouth every 8 (eight) hours as needed (for pain.).    Historical Provider, MD  methylPREDNISolone (MEDROL DOSEPAK) 4 MG TBPK tablet 6 po on day 1, then decrease by 1 per day 08/29/15   [name], MD  naproxen (NAPROSYN) 500 MG tablet Take 1 tablet (500 mg total) by mouth 2 (two) times daily. 08/29/15   [name], MD   BP 122/89 mmHg  Pulse 86  Temp(Src) 97.9 F (36.6 C) (Oral)  Resp 16  SpO2 100% Physical Exam  Constitutional: He is oriented to person, place, and time. He appears well-developed and well-nourished. No distress.  HENT:  Head: Normocephalic and atraumatic.  Eyes: EOM are normal.  Neck: Neck supple.  Cardiovascular: Normal rate.   Pulmonary/Chest: Effort normal. No respiratory distress.  Musculoskeletal: Normal range of motion.  Left hip: He exhibits tenderness.  Pain in the left hip with internal and external rotation. Full strength. Distal pulse intact. Sensation intact.  Neurological: He is alert and oriented to person, place, and time.  Skin: Skin is warm and dry.  Psychiatric: He has a normal mood and affect. His behavior is normal.  Nursing note and vitals reviewed.   ED Course  Procedures (including critical care time) DIAGNOSTIC STUDIES: Oxygen Saturation is 100% on RA, nl by my interpretation.    COORDINATION OF CARE: 7:04 PM Discussed treatment plan with pt at bedside which includes left hip xray, xray of lumbar spine, referral to orthopedist PRN and pt agreed to plan.   Labs  Review Labs Reviewed - No data to display  Imaging Review Dg Lumbar Spine Complete  09/23/2015  CLINICAL DATA:  Low back pain with left radiculopathy for 4 months, no known injury, initial encounter EXAM: LUMBAR SPINE - COMPLETE 4+ VIEW COMPARISON:  None. FINDINGS: Five lumbar type vertebral bodies are well visualized. Vertebral body height is well maintained. No pars defects or anterolisthesis is identified. No soft tissue abnormality is seen. IMPRESSION: No acute abnormality noted. Electronically Signed   By: Alcide CleverMark  Lukens M.D.   On: 09/23/2015 20:04   Dg Hip Unilat With Pelvis 2-3 Views Left  09/23/2015  CLINICAL DATA:  Persistent pain with lower extremity radicular symptoms for 4 months EXAM: DG HIP (WITH OR WITHOUT PELVIS) 2-3V LEFT COMPARISON:  None. FINDINGS: Frontal pelvis as well as frontal and lateral left hip images were obtained. No fracture or dislocation. Joint spaces appear intact. No erosive change. IMPRESSION: No fracture or dislocation.  No appreciable arthropathy. Electronically Signed   By: Bretta BangWilliam  Woodruff III M.D.   On: 09/23/2015 20:02   I have personally reviewed and evaluated these images as part of my medical decision-making.   EKG Interpretation None      MDM   Final diagnoses:  Pain of left lower extremity    Patient MRI done was of the brain. He may have radiculopathy. Has a previous hx of L hip dislocation. No red flag sxs. Will obtain plain films of the lumbar spine and hips. Care hand off given to PA Upstill   I personally performed the services described in this documentation, which was scribed in my presence. The recorded information has been reviewed and is accurate.        [name], [name] 09/24/15 1526  Leta BaptistEmily Roe Nguyen, MD 09/27/15 [phone]

## 2015-09-26 ENCOUNTER — Encounter (HOSPITAL_COMMUNITY): Payer: Self-pay | Admitting: Emergency Medicine

## 2015-09-26 ENCOUNTER — Emergency Department (HOSPITAL_COMMUNITY)
Admission: EM | Admit: 2015-09-26 | Discharge: 2015-09-26 | Disposition: A | Discharge status: Home / Self Care | Payer: No Typology Code available for payment source | Attending: Emergency Medicine | Admitting: Emergency Medicine

## 2015-09-26 DIAGNOSIS — Z791 Long term (current) use of non-steroidal anti-inflammatories (NSAID): Secondary | ICD-10-CM | POA: Insufficient documentation

## 2015-09-26 DIAGNOSIS — M5432 Sciatica, left side: Secondary | ICD-10-CM | POA: Insufficient documentation

## 2015-09-26 DIAGNOSIS — I10 Essential (primary) hypertension: Secondary | ICD-10-CM | POA: Insufficient documentation

## 2015-09-26 DIAGNOSIS — F17210 Nicotine dependence, cigarettes, uncomplicated: Secondary | ICD-10-CM | POA: Insufficient documentation

## 2015-09-26 MED ORDER — PREDNISONE 20 MG PO TABS: 40.0000 mg | ORAL_TABLET | Freq: Every day | ORAL | Status: DC

## 2015-09-26 MED ORDER — TRAMADOL HCL 50 MG PO TABS: 50.0000 mg | ORAL_TABLET | Freq: Four times a day (QID) | ORAL | Status: DC | PRN

## 2015-09-26 MED ORDER — KETOROLAC TROMETHAMINE 60 MG/2ML IM SOLN
60.0000 mg | Freq: Once | INTRAMUSCULAR | Status: AC
  Administered 2015-09-26: 60 mg via INTRAMUSCULAR
  Filled 2015-09-26: qty 2

## 2015-09-26 MED ORDER — DEXAMETHASONE SODIUM PHOSPHATE 10 MG/ML IJ SOLN
10.0000 mg | Freq: Once | INTRAMUSCULAR | Status: AC
  Administered 2015-09-26: 10 mg via INTRAMUSCULAR
  Filled 2015-09-26: qty 1

## 2015-09-26 NOTE — ED Notes (Signed)
Pt from home c/o left leg pain that shoots down leg. Hx of Sciatica. He reports he has called to follow up  With orthopedics but has not been bale to schedule an appointment.

## 2015-09-26 NOTE — ED Provider Notes (Signed)
CSN: [id number]     Arrival date & time 09/26/15  1925 History  By signing my name below, I, [name], attest that this documentation has been prepared under the direction and in the presence of TR[employer] AutomotiveKelly Doneta Bayman, [name]. Electronically Signed: [name], ED Scribe. 09/26/2015. 9:58 PM.   Chief Complaint  Patient presents with  . Sciatica   The history is provided by the patient. No language interpreter was used.  HPI Comments: Xavier Owens is a 44 y.o. Male with a hx of alcoholism, sciatica and HTN who presents to the Emergency Department complaining of sharp, "like an ice pick sticking in my hip" radiating left leg pain that shoots down the inner leg onset 3 days ago. Pt was seen Wednesday for same symptoms, x-rays were negative and pt was discharged home with Naproxen. He reports worsening pain with ambulation. Pt states that he has been tripping and falling forward, catching himself in a push up position, since being seen on Wednesday. Pt states that he has been taking his prescribed medication from past ED visits to no relief. He reports that the steroids he was prescribed brought him relief but the pain returned when he finished the steroid course. Pt states that he has called to follow up with orthopedics, but has not been able to schedule an appointment. He reports hx of back injury 15 years ago but was not treated for that injury. He denies fever, joint swelling, bladder or bowel incontinence, dysuria, numbness, or weakness. Pt states he works in [employer]eron[employer] and is constantly on his feet.  Past Medical History  Diagnosis Date  . Alcoholic (HCC)   . Hypertension    Past Surgical History  Procedure Laterality Date  . Hand surgery    . Knee surgery    . Dislocated hip     No family history on file. Social History  Substance Use Topics  . Smoking status: Current Every Day Smoker -- 0.30 packs/day    Types: Cigarettes  . Smokeless tobacco: None  . Alcohol Use: Yes   Comment: 5th alcohol for at least 15 years, 08/28/2015 3 beers & 1/2 pint liquor daily    Review of Systems  Constitutional: Negative for fever.  Musculoskeletal: Positive for arthralgias. Negative for joint swelling.  Neurological: Negative for weakness and numbness.  All other systems reviewed and are negative.  Allergies  Review of patient's allergies indicates no known allergies.  Home Medications   Prior to Admission medications   Medication Sig Start Date End Date Taking? Authorizing Provider  naproxen (NAPROSYN) 500 MG tablet Take 1 tablet (500 mg total) by mouth 3 (three) times daily with meals. 09/23/15  Yes Shari Upstill, [name]  methylPREDNISolone (MEDROL DOSEPAK) 4 MG TBPK tablet 6 po on day 1, then decrease by 1 per day Patient not taking: Reported on 09/26/2015 08/29/15   Rolland PorterMark James, MD  predniSONE (DELTASONE) 20 MG tablet Take 2 tablets (40 mg total) by mouth daily. Take 40 mg by mouth daily for 6 days, then 20mg  by mouth daily for 6 days, then 10mg  daily for 6 days 09/26/15   Antony MaduraKelly Tayvion Lauder, [name]  traMADol (ULTRAM) 50 MG tablet Take 1 tablet (50 mg total) by mouth every 6 (six) hours as needed for severe pain. 09/26/15   Antony MaduraKelly Fayetta Sorenson, [name]   BP 145/84 mmHg  Pulse 92  Temp(Src) 98.5 F (36.9 C) (Oral)  Resp 20  Ht 5\' 8"  (1.727 m)  Wt 160 lb (72.576 kg)  BMI 24.33 kg/m2  SpO2  95%   Physical Exam  Constitutional: He is oriented to person, place, and time. He appears well-developed and well-nourished. No distress.  Nontoxic/nonseptic appearing  HENT:  Head: Normocephalic and atraumatic.  Eyes: Conjunctivae and EOM are normal. No scleral icterus.  Neck: Normal range of motion.  Cardiovascular: Normal rate, regular rhythm and intact distal pulses.   DP and PT pulses 1+ in bilateral lower extremities  Pulmonary/Chest: Effort normal. No respiratory distress.  Respirations even and unlabored  Musculoskeletal: Normal range of motion. He exhibits tenderness.  Mild tenderness  to palpation to left lower paraspinal muscles. No bony deformities, step-offs, or crepitus to the lumbar midline.  Neurological: He is alert and oriented to person, place, and time. He exhibits normal muscle tone. Coordination normal.  GCS 15. Patient ambulatory with steady gait. Sensation to light touch intact in all extremities.  Skin: Skin is warm and dry. No rash noted. He is not diaphoretic. No erythema. No pallor.  Psychiatric: He has a normal mood and affect. His behavior is normal.  Nursing note and vitals reviewed.   ED Course  Procedures (including critical care time) DIAGNOSTIC STUDIES: Oxygen Saturation is 95% on RA, adequate by my interpretation.    COORDINATION OF CARE: 9:51 PM-Discussed treatment plan which includes referral to orthopedist, neurologist, and steroid taper with pt at bedside and pt agreed to plan.   Labs Review Labs Reviewed - No data to display  Imaging Review No results found. I have personally reviewed and evaluated these images and lab results as part of my medical decision-making.   EKG Interpretation None      MDM   Final diagnoses:  Sciatica of left side    44 year old male percent to the emergency department for evaluation of symptoms consistent with left-sided sciatica. Patient with a history of similar symptoms. He has had no relief at home with naproxen. He reports improvement in the past with steroids. No red flags or signs concerning for cauda equina today. Patient is ambulatory with steady gait. Is neurovascularly intact. Have advised continued use of naproxen. Will place patient on a prednisone taper. Short course of tramadol given for additional pain control. Return precautions discussed and provided. Patient agreeable to plan with no unaddressed concerns. Patient discharged in good condition.  I personally performed the services described in this documentation, which was scribed in my presence. The recorded information has been  reviewed and is accurate.    Filed Vitals:   09/26/15 2013 09/26/15 2114  BP: 137/87 145/84  Pulse: 113 92  Temp: 97.8 F (36.6 C) 98.5 F (36.9 C)  TempSrc: Oral Oral  Resp: 20 20  Height:  (1.727 m)   Weight: 160 lb (72.576 kg)   SpO2: 96% 95%      Antony Madura, [name] 09/26/15 2235  Melene Plan, DO 09/26/15 2304

## 2015-09-26 NOTE — Discharge Instructions (Signed)

## 2015-09-26 NOTE — ED Notes (Signed)
Patient was just seen on Wednesday for the same and discharged with naproxen.

## 2016-04-07 ENCOUNTER — Encounter (HOSPITAL_COMMUNITY): Payer: Self-pay | Admitting: Emergency Medicine

## 2016-04-07 ENCOUNTER — Emergency Department (HOSPITAL_COMMUNITY): Payer: Self-pay

## 2016-04-07 ENCOUNTER — Emergency Department (HOSPITAL_COMMUNITY)
Admission: EM | Admit: 2016-04-07 | Discharge: 2016-04-07 | Disposition: A | Discharge status: Home / Self Care | Payer: Self-pay | Attending: Emergency Medicine | Admitting: Emergency Medicine

## 2016-04-07 DIAGNOSIS — F17210 Nicotine dependence, cigarettes, uncomplicated: Secondary | ICD-10-CM | POA: Insufficient documentation

## 2016-04-07 DIAGNOSIS — R51 Headache: Secondary | ICD-10-CM | POA: Insufficient documentation

## 2016-04-07 DIAGNOSIS — E876 Hypokalemia: Secondary | ICD-10-CM | POA: Insufficient documentation

## 2016-04-07 DIAGNOSIS — R197 Diarrhea, unspecified: Secondary | ICD-10-CM | POA: Insufficient documentation

## 2016-04-07 DIAGNOSIS — F1010 Alcohol abuse, uncomplicated: Secondary | ICD-10-CM | POA: Insufficient documentation

## 2016-04-07 DIAGNOSIS — E860 Dehydration: Secondary | ICD-10-CM | POA: Insufficient documentation

## 2016-04-07 DIAGNOSIS — R519 Headache, unspecified: Secondary | ICD-10-CM

## 2016-04-07 DIAGNOSIS — I10 Essential (primary) hypertension: Secondary | ICD-10-CM | POA: Insufficient documentation

## 2016-04-07 DIAGNOSIS — Z79899 Other long term (current) drug therapy: Secondary | ICD-10-CM | POA: Insufficient documentation

## 2016-04-07 LAB — COMPREHENSIVE METABOLIC PANEL
ALBUMIN: 3.7 g/dL (ref 3.5–5.0)
ALT: 159 U/L — AB (ref 17–63)
AST: 343 U/L — ABNORMAL HIGH (ref 15–41)
Alkaline Phosphatase: 117 U/L (ref 38–126)
Anion gap: 10 (ref 5–15)
BUN: 12 mg/dL (ref 6–20)
CHLORIDE: 105 mmol/L (ref 101–111)
CO2: 22 mmol/L (ref 22–32)
Calcium: 8.4 mg/dL — ABNORMAL LOW (ref 8.9–10.3)
Creatinine, Ser: 1.03 mg/dL (ref 0.61–1.24)
GFR calc non Af Amer: >60 mL/min (ref >60)
GLUCOSE: 105 mg/dL — AB (ref 65–99)
Potassium: 3.1 mmol/L — ABNORMAL LOW (ref 3.5–5.1)
SODIUM: 137 mmol/L (ref 135–145)
Total Bilirubin: 0.9 mg/dL (ref 0.3–1.2)
Total Protein: 7.1 g/dL (ref 6.5–8.1)

## 2016-04-07 LAB — URINALYSIS, ROUTINE W REFLEX MICROSCOPIC
Bilirubin Urine: NEGATIVE
GLUCOSE, UA: NEGATIVE mg/dL
Ketones, ur: NEGATIVE mg/dL
Leukocytes, UA: NEGATIVE
Nitrite: NEGATIVE
Protein, ur: NEGATIVE mg/dL
Specific Gravity, Urine: 1.013 (ref 1.005–1.030)
pH: 6.5 (ref 5.0–8.0)

## 2016-04-07 LAB — URINE MICROSCOPIC-ADD ON

## 2016-04-07 LAB — CBC
HCT: 41.1 % (ref 39.0–52.0)
HEMOGLOBIN: 14.5 g/dL (ref 13.0–17.0)
MCH: 30.4 pg (ref 26.0–34.0)
MCHC: 35.3 g/dL (ref 30.0–36.0)
MCV: 86.2 fL (ref 78.0–100.0)
Platelets: 329 10*3/uL (ref 150–400)
RBC: 4.77 MIL/uL (ref 4.22–5.81)
RDW: 13.6 % (ref 11.5–15.5)
WBC: 5.5 10*3/uL (ref 4.0–10.5)

## 2016-04-07 LAB — LIPASE, BLOOD: Lipase: 33 U/L (ref 11–51)

## 2016-04-07 LAB — TROPONIN I: Troponin I: <0.03 ng/mL (ref <0.031)

## 2016-04-07 LAB — ETHANOL: Alcohol, Ethyl (B): 213 mg/dL — ABNORMAL HIGH (ref <5)

## 2016-04-07 MED ORDER — SODIUM CHLORIDE 0.9 % IV BOLUS (SEPSIS)
1000.0000 mL | Freq: Once | INTRAVENOUS | Status: AC
  Administered 2016-04-07: 1000 mL via INTRAVENOUS

## 2016-04-07 MED ORDER — MAGNESIUM 30 MG PO TABS: 30.0000 mg | ORAL_TABLET | Freq: Two times a day (BID) | ORAL | Status: DC

## 2016-04-07 MED ORDER — POTASSIUM CHLORIDE ER 10 MEQ PO TBCR: 10.0000 meq | EXTENDED_RELEASE_TABLET | Freq: Every day | ORAL | Status: DC

## 2016-04-07 NOTE — ED Notes (Signed)
Per pt, states headache and diarrhea for a month-weight loss

## 2016-04-07 NOTE — ED Notes (Signed)
Pt c/o morning sickness and intermittent blurred vision x "over a year" and diarrhea and intermittent headache x 1 month.  Sts stool is green.  Denies abdominal pain.  Pt has not tried any medications to stop emesis or diarrhea.  Pain score 8/10.

## 2016-04-07 NOTE — Discharge Instructions (Signed)

## 2016-04-07 NOTE — ED Notes (Signed)
2x attempt at collecting urine. Pt states he still doesn't need to go at this time.

## 2016-04-07 NOTE — ED Provider Notes (Signed)
CSN: [id number]     Arrival date & time 04/07/16  0847 History   First MD Initiated Contact with Patient 04/07/16 0912     Chief Complaint  Patient presents with  . Headache  . Diarrhea   PT SAID THAT HE HAS NOT FELT WELL FOR ABOUT 1 MONTH.  HE C/O H/A, DIARRHEA, AND WEIGHT LOSS. THE PT DOES NOT HAVE A PCP SO HE HAS NOT SEEN A DR.  THE PT HAS A HX OF HTN, BUT DOES NOT TAKE MEDS FOR IT.  PT ALSO C/O A BURNING HEADACHE.  (Consider location/radiation/quality/duration/timing/severity/associated sxs/prior Treatment) Patient is a 45 y.o. male presenting with headaches and diarrhea. The history is provided by the patient. The history is limited by a language barrier.  Headache Pain location:  Generalized Radiates to:  Does not radiate Onset quality:  Gradual Timing:  Constant Progression:  Unchanged Chronicity:  Chronic Similar to prior headaches: yes   Context: bright light   Relieved by:  Nothing Ineffective treatments:  None tried Associated symptoms: diarrhea and fatigue   Diarrhea Associated symptoms: headaches     Past Medical History  Diagnosis Date  . Alcoholic (HCC)   . Hypertension    Past Surgical History  Procedure Laterality Date  . Hand surgery    . Knee surgery    . Dislocated hip     No family history on file. Social History  Substance Use Topics  . Smoking status: Current Every Day Smoker -- 0.30 packs/day    Types: Cigarettes  . Smokeless tobacco: None  . Alcohol Use: Yes     Comment: 5th alcohol for at least 15 years, 08/28/2015 3 beers & 1/2 pint liquor daily    Review of Systems  Constitutional: Positive for fatigue.  Gastrointestinal: Positive for diarrhea.  Neurological: Positive for headaches.  All other systems reviewed and are negative.     Allergies  Review of patient's allergies indicates no known allergies.  Home Medications   Prior to Admission medications   Medication Sig Start Date End Date Taking? Authorizing Provider   magnesium 30 MG tablet Take 1 tablet (30 mg total) by mouth 2 (two) times daily. 04/07/16   Jacalyn Lefevre, MD  methylPREDNISolone (MEDROL DOSEPAK) 4 MG TBPK tablet 6 po on day 1, then decrease by 1 per day Patient not taking: Reported on 09/26/2015 08/29/15   [name], MD  naproxen (NAPROSYN) 500 MG tablet Take 1 tablet (500 mg total) by mouth 3 (three) times daily with meals. Patient not taking: Reported on 04/07/2016 09/23/15   Elpidio Anis, [name]  potassium chloride (K-DUR) 10 MEQ tablet Take 1 tablet (10 mEq total) by mouth daily. 04/07/16   Jacalyn Lefevre, MD  predniSONE (DELTASONE) 20 MG tablet Take 2 tablets (40 mg total) by mouth daily. Take 40 mg by mouth daily for 6 days, then  by mouth daily for 6 days, then  daily for 6 days Patient not taking: Reported on 04/07/2016 09/26/15   Antony Madura, [name]  traMADol (ULTRAM) 50 MG tablet Take 1 tablet (50 mg total) by mouth every 6 (six) hours as needed for severe pain. Patient not taking: Reported on 04/07/2016 09/26/15   Antony Madura, [name]   BP 131/88 mmHg  Pulse 76  Temp(Src) 98.8 F (37.1 C) (Oral)  Resp 11  SpO2 95% Physical Exam  Constitutional: He is oriented to person, place, and time. He appears well-developed and well-nourished.  HENT:  Head: Normocephalic and atraumatic.  Right Ear: External ear normal.  Left Ear: External ear normal.  Nose: Nose normal.  Mouth/Throat: Oropharynx is clear and moist.  Eyes: Conjunctivae and EOM are normal. Pupils are equal, round, and reactive to light.  Neck: Normal range of motion. Neck supple.  Cardiovascular: Normal rate, regular rhythm, normal heart sounds and intact distal pulses.   Pulmonary/Chest: Effort normal and breath sounds normal.  Abdominal: Soft. Bowel sounds are normal.  Musculoskeletal: Normal range of motion.  Neurological: He is alert and oriented to person, place, and time.  Skin: Skin is warm and dry.  Psychiatric: He has a normal mood and affect. His behavior  is normal. Judgment and thought content normal.  Nursing note and vitals reviewed.   ED Course  Procedures (including critical care time) Labs Review Labs Reviewed  COMPREHENSIVE METABOLIC PANEL - Abnormal; Notable for the following:    Potassium 3.1 (*)    Glucose, Bld 105 (*)    Calcium 8.4 (*)    AST 343 (*)    ALT 159 (*)    All other components within normal limits  URINALYSIS, ROUTINE W REFLEX MICROSCOPIC (NOT AT [facility]) - Abnormal; Notable for the following:    Hgb urine dipstick SMALL (*)    All other components within normal limits  ETHANOL - Abnormal; Notable for the following:    Alcohol, Ethyl (B) 213 (*)    All other components within normal limits  URINE MICROSCOPIC-ADD ON - Abnormal; Notable for the following:    Squamous Epithelial / LPF 0-5 (*)    Bacteria, UA RARE (*)    All other components within normal limits  CBC  LIPASE, BLOOD  TROPONIN I    Imaging Review Ct Head Wo Contrast  04/07/2016  CLINICAL DATA:  Headache and nausea for 1 month. EXAM: CT HEAD WITHOUT CONTRAST TECHNIQUE: Contiguous axial images were obtained from the base of the skull through the vertex without intravenous contrast. COMPARISON:  MRI brain 06/10/2015. FINDINGS: No acute cortical infarct, hemorrhage, or mass lesion is present. The ventricles are of normal size. No significant extra-axial fluid collection is evident. The paranasal sinuses and mastoid air cells are clear. The calvarium is intact. The globes and orbits are intact. No significant extracranial soft tissue lesions are evident. IMPRESSION: Negative CT of the head. Electronically Signed   By: Marin Robertshristopher  Mattern M.D.   On: 04/07/2016 09:57   I have personally reviewed and evaluated these images and lab results as part of my medical decision-making.   EKG Interpretation   Date/Time:  Thursday Apr 07 2016 09:36:29 EDT Ventricular Rate:  72 PR Interval:  167 QRS Duration: 101 QT Interval:  387 QTC Calculation: 423 R Axis:    54 Text Interpretation:  Sinus rhythm Confirmed by Marcellino Fidalgo MD, Kiyara Bouffard ([id number])  on 04/07/2016 9:40:05 AM      MDM  PT IS FEELING BETTER.  HE KNOWS TO RETURN IF WORSE.  HE IS GIVEN OUTPATIENT NUMBERS FOR ETOH ABUSE. Final diagnoses:  Alcohol abuse  Hypokalemia  Diarrhea, unspecified type  Dehydration  Acute nonintractable headache, unspecified headache type        Jacalyn LefevreJulie Talma Aguillard, MD 04/07/16 1250

## 2016-04-07 NOTE — ED Notes (Signed)
Pt states that he does have High BP sometimes, but doesn't take meds

## 2016-04-13 ENCOUNTER — Emergency Department (HOSPITAL_COMMUNITY)
Admission: EM | Admit: 2016-04-13 | Discharge: 2016-04-13 | Disposition: A | Discharge status: Home / Self Care | Payer: No Typology Code available for payment source | Attending: Emergency Medicine | Admitting: Emergency Medicine

## 2016-04-13 ENCOUNTER — Encounter (HOSPITAL_COMMUNITY): Payer: Self-pay | Admitting: Emergency Medicine

## 2016-04-13 DIAGNOSIS — Z79899 Other long term (current) drug therapy: Secondary | ICD-10-CM | POA: Insufficient documentation

## 2016-04-13 DIAGNOSIS — I10 Essential (primary) hypertension: Secondary | ICD-10-CM | POA: Insufficient documentation

## 2016-04-13 DIAGNOSIS — F17210 Nicotine dependence, cigarettes, uncomplicated: Secondary | ICD-10-CM | POA: Insufficient documentation

## 2016-04-13 DIAGNOSIS — K529 Noninfective gastroenteritis and colitis, unspecified: Secondary | ICD-10-CM | POA: Insufficient documentation

## 2016-04-13 DIAGNOSIS — R634 Abnormal weight loss: Secondary | ICD-10-CM | POA: Insufficient documentation

## 2016-04-13 LAB — COMPREHENSIVE METABOLIC PANEL
ALT: 174 U/L — ABNORMAL HIGH (ref 17–63)
ANION GAP: 11 (ref 5–15)
AST: 267 U/L — AB (ref 15–41)
Albumin: 4.1 g/dL (ref 3.5–5.0)
Alkaline Phosphatase: 141 U/L — ABNORMAL HIGH (ref 38–126)
BUN: 10 mg/dL (ref 6–20)
CO2: 25 mmol/L (ref 22–32)
Calcium: 8.9 mg/dL (ref 8.9–10.3)
Chloride: 99 mmol/L — ABNORMAL LOW (ref 101–111)
Creatinine, Ser: 1.03 mg/dL (ref 0.61–1.24)
GFR calc Af Amer: >60 mL/min (ref >60)
GFR calc non Af Amer: >60 mL/min (ref >60)
GLUCOSE: 99 mg/dL (ref 65–99)
POTASSIUM: 3.6 mmol/L (ref 3.5–5.1)
Sodium: 135 mmol/L (ref 135–145)
TOTAL PROTEIN: 7.9 g/dL (ref 6.5–8.1)
Total Bilirubin: 1.4 mg/dL — ABNORMAL HIGH (ref 0.3–1.2)

## 2016-04-13 LAB — RAPID HIV SCREEN (HIV 1/2 AB+AG)
HIV 1/2 ANTIBODIES: NONREACTIVE
HIV-1 P24 Antigen - HIV24: NONREACTIVE

## 2016-04-13 LAB — GASTROINTESTINAL PANEL BY PCR, STOOL (REPLACES STOOL CULTURE)
ASTROVIRUS: NOT DETECTED
Adenovirus F40/41: NOT DETECTED
CRYPTOSPORIDIUM: NOT DETECTED
CYCLOSPORA CAYETANENSIS: NOT DETECTED
Campylobacter species: NOT DETECTED
E. COLI O157: NOT DETECTED
ENTEROTOXIGENIC E COLI (ETEC): NOT DETECTED
Entamoeba histolytica: NOT DETECTED
Enteroaggregative E coli (EAEC): NOT DETECTED
Enteropathogenic E coli (EPEC): NOT DETECTED
Giardia lamblia: NOT DETECTED
Norovirus GI/GII: NOT DETECTED
PLESIMONAS SHIGELLOIDES: NOT DETECTED
ROTAVIRUS A: NOT DETECTED
SAPOVIRUS (I, II, IV, AND V): NOT DETECTED
SHIGA LIKE TOXIN PRODUCING E COLI (STEC): NOT DETECTED
Salmonella species: NOT DETECTED
Shigella/Enteroinvasive E coli (EIEC): NOT DETECTED
VIBRIO SPECIES: NOT DETECTED
Vibrio cholerae: NOT DETECTED
Yersinia enterocolitica: NOT DETECTED

## 2016-04-13 LAB — CBC WITH DIFFERENTIAL/PLATELET
BASOS ABS: 0 10*3/uL (ref 0.0–0.1)
Basophils Relative: 1 %
Eosinophils Absolute: 0 10*3/uL (ref 0.0–0.7)
Eosinophils Relative: 0 %
HEMATOCRIT: 40.4 % (ref 39.0–52.0)
Hemoglobin: 14.2 g/dL (ref 13.0–17.0)
LYMPHS ABS: 1 10*3/uL (ref 0.7–4.0)
LYMPHS PCT: 16 %
MCH: 30.5 pg (ref 26.0–34.0)
MCHC: 35.1 g/dL (ref 30.0–36.0)
MCV: 86.7 fL (ref 78.0–100.0)
MONO ABS: 0.4 10*3/uL (ref 0.1–1.0)
Monocytes Relative: 6 %
NEUTROS ABS: 4.7 10*3/uL (ref 1.7–7.7)
Neutrophils Relative %: 77 %
Platelets: 244 10*3/uL (ref 150–400)
RBC: 4.66 MIL/uL (ref 4.22–5.81)
RDW: 13.3 % (ref 11.5–15.5)
WBC: 6.2 10*3/uL (ref 4.0–10.5)

## 2016-04-13 LAB — TSH: TSH: 1.906 u[IU]/mL (ref 0.350–4.500)

## 2016-04-13 MED ORDER — ALBENDAZOLE 200 MG PO TABS
400.0000 mg | ORAL_TABLET | Freq: Once | ORAL | Status: AC
  Administered 2016-04-13: 400 mg via ORAL
  Filled 2016-04-13: qty 2

## 2016-04-13 MED ORDER — CIPROFLOXACIN HCL 500 MG PO TABS: 500.0000 mg | ORAL_TABLET | Freq: Two times a day (BID) | ORAL | Status: DC

## 2016-04-13 NOTE — ED Notes (Signed)
MD aware that enough stool sample was not obtained. MD stated patient can follow up with PCP for stool sample. Patient made aware.

## 2016-04-13 NOTE — Discharge Instructions (Signed)
Chronic Diarrhea Diarrhea is frequent loose and watery bowel movements. It can cause you to feel weak and dehydrated. Dehydration can cause you to become tired and thirsty and to have a dry mouth, decreased urination, and dark yellow urine. Diarrhea is a sign of another problem, most often an infection that will not last long. In most cases, diarrhea lasts 2-3 days. Diarrhea that lasts longer than 4 weeks is called long-lasting (chronic) diarrhea. It is important to treat your diarrhea as directed by your health care provider to lessen or prevent future episodes of diarrhea.  CAUSES  There are many causes of chronic diarrhea. The following are some possible causes:   Gastrointestinal infections caused by viruses, bacteria, or parasites.   Food poisoning or food allergies.   Certain medicines, such as antibiotics, chemotherapy, and laxatives.   Artificial sweeteners and fructose.   Digestive disorders, such as celiac disease and inflammatory bowel diseases.   Irritable bowel syndrome.  Some disorders of the pancreas.  Disorders of the thyroid.  Reduced blood flow to the intestines.  Cancer. Sometimes the cause of chronic diarrhea is unknown. RISK FACTORS  Having a severely weakened immune system, such as from HIV or AIDS.   Taking certain types of cancer-fighting drugs (such as with chemotherapy) or other medicines.   Having had a recent organ transplant.   Having a portion of the stomach or small bowel removed.   Traveling to countries where food and water supplies are often contaminated.  SYMPTOMS  In addition to frequent, loose stools, diarrhea may cause:   Cramping.   Abdominal pain.   Nausea.   Fever.  Fatigue.  Urgent need to use the bathroom.  Loss of bowel control. DIAGNOSIS  Your health care provider must take a careful history and perform a physical exam. Tests given are based on your symptoms and history. Tests may include:   Blood or  stool tests. Three or more stool samples may be examined. Stool cultures may be used to test for bacteria or parasites.   X-rays.   A procedure in which a thin tube is inserted into the mouth or rectum (endoscopy). This allows the health care provider to look inside the intestine.  TREATMENT   Treatment is aimed at correcting the cause of the diarrhea when possible.  Diarrhea caused by an infection can often be treated with antibiotic medicines.  Diarrhea not caused by an infection may require you to take long-term medicine or have surgery. Specific treatment should be discussed with your health care provider.  If the cause cannot be determined, treatment aims to relieve symptoms and prevent dehydration. Serious health problems can occur if you do not maintain proper fluid levels. Treatment may include:  Taking an oral rehydration solution (ORS).  Not drinking beverages that contain caffeine (such as tea, coffee, and soft drinks).  Not drinking alcohol.  Maintaining well-balanced nutrition to help you recover faster. HOME CARE INSTRUCTIONS   Drink enough fluids to keep urine clear or pale yellow. Drink 1 cup (8 oz) of fluid for each diarrhea episode. Avoid fluids that contain simple sugars, fruit juices, whole milk products, and sodas. Hydrate with an ORS. You may purchase the ORS or prepare it at home by mixing the following ingredients together:   - tsp (1.7-3  mL) table salt.   tsp (3  mL) baking soda.   tsp (1.7 mL) salt substitute containing potassium chloride.  1 tbsp (20 mL) sugar.  4.2 c (1 L) of water.     Certain foods and beverages may increase the speed at which food moves through the gastrointestinal (GI) tract. These foods and beverages should be avoided. They include:  Caffeinated and alcoholic beverages.  High-fiber foods, such as raw fruits and vegetables, nuts, seeds, and whole grain breads and cereals.  Foods and beverages sweetened with sugar  alcohols, such as xylitol, sorbitol, and mannitol.   Some foods may be well tolerated and may help thicken stool. These include:  Starchy foods, such as rice, toast, pasta, low-sugar cereal, oatmeal, grits, baked potatoes, crackers, and bagels.  Bananas.  Applesauce.  Add probiotic-rich foods to help increase healthy bacteria in the GI tract. These include yogurt and fermented milk products.  Wash your hands well after each diarrhea episode.  Only take over-the-counter or prescription medicines as directed by your health care provider.  Take a warm bath to relieve any burning or pain from frequent diarrhea episodes. SEEK MEDICAL CARE IF:   You are not urinating as often.  Your urine is a dark color.  You become very tired or dizzy.  You have severe pain in the abdomen or rectum.  Your have blood or pus in your stools.  Your stools look black and tarry. SEEK IMMEDIATE MEDICAL CARE IF:   You are unable to keep fluids down.  You have persistent vomiting.  You have blood in your stool.  Your stools are black and tarry.  You do not urinate in 6-8 hours, or there is only a small amount of very dark urine.  You have abdominal pain that increases or localizes.  You have weakness, dizziness, confusion, or lightheadedness.  You have a severe headache.  Your diarrhea gets worse or does not get better.  You have a fever or persistent symptoms for more than 2-3 days.  You have a fever and your symptoms suddenly get worse. MAKE SURE YOU:   Understand these instructions.  Will watch your condition.  Will get help right away if you are not doing well or get worse.   This information is not intended to replace advice given to you by your health care provider. Make sure you discuss any questions you have with your health care provider.   Document Released: 01/21/2004 Document Revised: 11/05/2013 Document Reviewed: 04/25/2013 Elsevier Interactive Patient Education 2016  [employer]  

## 2016-04-13 NOTE — ED Notes (Signed)
Discharge instructions, follow up care, and rx x1 reviewed with patient. Patient verbalized understanding. 

## 2016-04-13 NOTE — ED Notes (Signed)
RN drawing Labs  

## 2016-04-13 NOTE — ED Notes (Addendum)
Lab stated that another stool sample is needed. Patient made aware.

## 2016-04-13 NOTE — ED Provider Notes (Signed)
CSN: [id number]     Arrival date & time 04/13/16  0815 History   First MD Initiated Contact with Patient 04/13/16 0830     Chief Complaint  Patient presents with  . Weight Loss     (Consider location/radiation/quality/duration/timing/severity/associated sxs/prior Treatment) Patient is a 45 y.o. male presenting with general illness. The history is provided by the patient.  Illness Severity:  Moderate Onset quality:  Gradual Duration:  6 months Timing:  Constant Progression:  Worsening Chronicity:  New Associated symptoms: diarrhea, nausea and vomiting   Associated symptoms: no abdominal pain, no chest pain, no congestion, no fever, no headaches, no myalgias, no rash and no shortness of breath    45 yo M With a chief complaint of weight loss. This been going on for the past 6 months. He thinks it started once he visited [place]. His fiance is having similar weight loss. Patient had chronic diarrhea since then. He has had periodic vomiting as well. Denies cough congestion fevers.  Past Medical History  Diagnosis Date  . Alcoholic (HCC)   . Hypertension    Past Surgical History  Procedure Laterality Date  . Hand surgery    . Knee surgery    . Dislocated hip     No family history on file. Social History  Substance Use Topics  . Smoking status: Current Every Day Smoker -- 0.30 packs/day    Types: Cigarettes  . Smokeless tobacco: None  . Alcohol Use: Yes     Comment: 5th alcohol for at least 15 years, 08/28/2015 3 beers & 1/2 pint liquor daily    Review of Systems  Constitutional: Negative for fever and chills.  HENT: Negative for congestion and facial swelling.   Eyes: Negative for discharge and visual disturbance.  Respiratory: Negative for shortness of breath.   Cardiovascular: Negative for chest pain and palpitations.  Gastrointestinal: Positive for nausea, vomiting and diarrhea. Negative for abdominal pain.  Musculoskeletal: Negative for myalgias and arthralgias.   Skin: Negative for color change and rash.  Neurological: Negative for tremors, syncope and headaches.  Psychiatric/Behavioral: Negative for confusion and dysphoric mood.      Allergies  Review of patient's allergies indicates no known allergies.  Home Medications   Prior to Admission medications   Medication Sig Start Date End Date Taking? Authorizing Provider  potassium chloride (K-DUR) 10 MEQ tablet Take 1 tablet (10 mEq total) by mouth daily. 04/07/16  Yes Jacalyn Lefevre, MD  ciprofloxacin (CIPRO) 500 MG tablet Take 1 tablet (500 mg total) by mouth 2 (two) times daily. One po bid x 5 days 04/13/16   Melene Plan, DO  magnesium 30 MG tablet Take 1 tablet (30 mg total) by mouth 2 (two) times daily. Patient not taking: Reported on 04/13/2016 04/07/16   Jacalyn Lefevre, MD  naproxen (NAPROSYN) 500 MG tablet Take 1 tablet (500 mg total) by mouth 3 (three) times daily with meals. Patient not taking: Reported on 04/07/2016 09/23/15   Elpidio Anis, [name]  traMADol (ULTRAM) 50 MG tablet Take 1 tablet (50 mg total) by mouth every 6 (six) hours as needed for severe pain. Patient not taking: Reported on 04/07/2016 09/26/15   Antony Madura, [name]   BP 144/100 mmHg  Pulse 78  Temp(Src) 98.2 F (36.8 C) (Oral)  Resp 16  Ht  (1.727 m)  Wt 150 lb (68.04 kg)  BMI 22.81 kg/m2  SpO2 100% Physical Exam  Constitutional: He is oriented to person, place, and time. He appears well-developed and well-nourished.  HENT:  Head: Normocephalic and atraumatic.  Eyes: EOM are normal. Pupils are equal, round, and reactive to light.  Neck: Normal range of motion. Neck supple. No JVD present.  Cardiovascular: Normal rate and regular rhythm.  Exam reveals no gallop and no friction rub.   No murmur heard. Pulmonary/Chest: No respiratory distress. He has no wheezes.  Abdominal: He exhibits no distension. There is no tenderness. There is no rebound and no guarding.  Musculoskeletal: Normal range of motion.   Neurological: He is alert and oriented to person, place, and time.  Skin: No rash noted. No pallor.  Psychiatric: He has a normal mood and affect. His behavior is normal.  Nursing note and vitals reviewed.   ED Course  Procedures (including critical care time) Labs Review Labs Reviewed  COMPREHENSIVE METABOLIC PANEL - Abnormal; Notable for the following:    Chloride 99 (*)    AST 267 (*)    ALT 174 (*)    Alkaline Phosphatase 141 (*)    Total Bilirubin 1.4 (*)    All other components within normal limits  GASTROINTESTINAL PANEL BY PCR, STOOL (REPLACES STOOL CULTURE)  OVA + PARASITE EXAM  CBC WITH DIFFERENTIAL/PLATELET  TSH  RAPID HIV SCREEN (HIV 1/2 AB+AG)    Imaging Review No results found. I have personally reviewed and evaluated these images and lab results as part of my medical decision-making.   EKG Interpretation None      MDM   Final diagnoses:  Chronic diarrhea of unknown origin  Weight loss    45 yo M with a chief complaint of weight loss. This been going on for the past 6 months. Unsure of the exact etiology. With a partner with the same illness concern for possible HIV or potentially a parasitic disease. Patient is unable to have a bowel movement while in the ED. Referred to a family physician. Will treat with antiparasitic's as well as antibiotics.   10:14 AM:  I have discussed the diagnosis/risks/treatment options with the patient and believe the pt to be eligible for discharge home to follow-up with PCP. We also discussed returning to the ED immediately if new or worsening sx occur. We discussed the sx which are most concerning (e.g., sudden worsening pain, fever, inability to tolerate by mouth) that necessitate immediate return. Medications administered to the patient during their visit and any new prescriptions provided to the patient are listed below.  Medications given during this visit Medications  albendazole [facility](ALBENZA) tablet 400 mg (400 mg Oral  Given 04/13/16 0941)    Discharge Medication List as of 04/13/2016  9:54 AM    START taking these medications   Details  ciprofloxacin (CIPRO) 500 MG tablet Take 1 tablet (500 mg total) by mouth 2 (two) times daily. One po bid x 5 days, Starting 04/13/2016, Until Discontinued, Print        The patient appears reasonably screen and/or stabilized for discharge and I doubt any other medical condition or other [facility] requiring further screening, evaluation, or treatment in the ED at this time prior to discharge.     Melene Planan Anastazja Isaac, DO 04/13/16 1014

## 2016-04-13 NOTE — ED Notes (Addendum)
Patient reports unexplained weight loss of about 20 pounds since October. Reports n/v/d since October. Pt went out of the country [place] at the end of October. Patient alert, oriented, in no acute distress. Patient seen here last week for the same symptoms

## 2016-04-19 DIAGNOSIS — R519 Headache, unspecified: Secondary | ICD-10-CM | POA: Diagnosis present

## 2016-04-19 DIAGNOSIS — R748 Abnormal levels of other serum enzymes: Secondary | ICD-10-CM | POA: Diagnosis present

## 2016-04-19 DIAGNOSIS — I10 Essential (primary) hypertension: Secondary | ICD-10-CM | POA: Diagnosis present

## 2016-09-14 ENCOUNTER — Emergency Department (HOSPITAL_COMMUNITY)
Admission: EM | Admit: 2016-09-14 | Discharge: 2016-09-14 | Disposition: A | Discharge status: Home / Self Care | Payer: No Typology Code available for payment source | Attending: Emergency Medicine | Admitting: Emergency Medicine

## 2016-09-14 ENCOUNTER — Encounter (HOSPITAL_COMMUNITY): Payer: Self-pay | Admitting: *Deleted

## 2016-09-14 DIAGNOSIS — I10 Essential (primary) hypertension: Secondary | ICD-10-CM | POA: Insufficient documentation

## 2016-09-14 DIAGNOSIS — F17210 Nicotine dependence, cigarettes, uncomplicated: Secondary | ICD-10-CM | POA: Insufficient documentation

## 2016-09-14 DIAGNOSIS — M5416 Radiculopathy, lumbar region: Secondary | ICD-10-CM | POA: Insufficient documentation

## 2016-09-14 DIAGNOSIS — Z79899 Other long term (current) drug therapy: Secondary | ICD-10-CM | POA: Insufficient documentation

## 2016-09-14 MED ORDER — PREDNISONE 50 MG PO TABS: ORAL_TABLET | ORAL | 0 refills | Status: DC

## 2016-09-14 MED ORDER — HYDROCODONE-ACETAMINOPHEN 5-325 MG PO TABS: ORAL_TABLET | ORAL | 0 refills | Status: DC

## 2016-09-14 NOTE — ED Triage Notes (Signed)
Pt report left leg pain that begins in the thigh and "shoots across the knee and down to my leg."  Pt also reports right knee pain. Pt reports pain began about 2 months ago.  Pt reports he fell about 3 weeks ago when his legs gave out.  Pt observed ambulating from lobby to room without any difficulties.

## 2016-09-14 NOTE — ED Provider Notes (Signed)
[facility]-EMERGENCY DEPT Provider Note   CSN: [id number] Arrival date & time: 09/14/16  1239  By signing my name below, I, Xavier Owens, attest that this documentation has been prepared under the direction and in the presence of [employer] Corporationicole Mazell Aylesworth, [name]. Electronically Signed: Angelene GiovanniEmmanuella Owens, ED Scribe. 09/14/16. 1:21 PM.   History   Chief Complaint Chief Complaint  Patient presents with  . Leg Pain    HPI Comments: Xavier Owens is a 45 y.o. male with a hx of hypertension who presents to the Emergency Department complaining of gradually worsening 9/10 pain that begins in his left groin area that radiates down the anterior aspect of his left thigh to the lateral aspect of his left lower leg onset 2 months ago. He decribes his pain as shooting and states that although the pain is chronic (has had intermittent episodes for several years), this episode has been more severe. He reports associated mild lower back pain, numbness/tingling to his left foot, and pain with ambulating/running. He explains that he normally has these symptoms with weather changes. No alleviating factors noted. He states that he has tried Ibuprofen and Advil PTA with no relief. He denies that he has discussed this ongoing issue with his PCP. Pt has been evaluated several times for these symptoms and reports that he normally received relief from prednisone. He states that he fell 2 weeks ago as he was trying to stand up from a sitting position but denies any head injuries or LOC. He reports that he sustained mild bruising to his right elbow which has since healed but no other injuries. He denies a hx of cancers or IV drug use. Pt states that he works in [employer]. He denies any fever, numbness to the scrotum, bowel/bladder incontinence, urinary symptoms, or any other symptoms.   The history is provided by the patient. No language interpreter was used.    Past Medical History:  Diagnosis Date  . Alcoholic (HCC)   .  Hypertension     There are no active problems to display for this patient.   Past Surgical History:  Procedure Laterality Date  . dislocated hip    . HAND SURGERY    . KNEE SURGERY         Home Medications    Prior to Admission medications   Medication Sig Start Date End Date Taking? Authorizing Provider  ciprofloxacin (CIPRO) 500 MG tablet Take 1 tablet (500 mg total) by mouth 2 (two) times daily. One po bid x 5 days 04/13/16   Melene Planan Floyd, DO  HYDROcodone-acetaminophen (NORCO/VICODIN) 5-325 MG tablet Take 1-2 tablets by mouth every 6 hours as needed for pain. 09/14/16   Xavier Rovira, [name]  magnesium 30 MG tablet Take 1 tablet (30 mg total) by mouth 2 (two) times daily. Patient not taking: Reported on 04/13/2016 04/07/16   Xavier LefevreJulie Haviland, MD  naproxen (NAPROSYN) 500 MG tablet Take 1 tablet (500 mg total) by mouth 3 (three) times daily with meals. Patient not taking: Reported on 04/07/2016 09/23/15   Xavier AnisShari Upstill, [name]  potassium chloride (K-DUR) 10 MEQ tablet Take 1 tablet (10 mEq total) by mouth daily. 04/07/16   Xavier LefevreJulie Haviland, MD  predniSONE (DELTASONE) 50 MG tablet Take 1 tablet daily with breakfast 09/14/16   Xavier ReiningNicole Rogen Porte, [name]  traMADol (ULTRAM) 50 MG tablet Take 1 tablet (50 mg total) by mouth every 6 (six) hours as needed for severe pain. Patient not taking: Reported on 04/07/2016 09/26/15   Xavier MaduraKelly Humes, [name]    Family History  No family history on file.  Social History Social History  Substance Use Topics  . Smoking status: Current Every Day Smoker    Packs/day: 0.30    Types: Cigarettes  . Smokeless tobacco: Not on file  . Alcohol use Yes     Comment: 5th alcohol for at least 15 years, 08/28/2015 3 beers & 1/2 pint liquor daily     Allergies   Review of patient's allergies indicates no known allergies.   Review of Systems Review of Systems A complete 10 system review of systems was obtained and all systems are negative except as noted in the HPI and PMH.     Physical Exam Updated Vital Signs BP 140/91   Pulse 71   Temp 98.1 F (36.7 C) (Oral)   Resp 16   SpO2 100%   Physical Exam  Constitutional: He is oriented to person, place, and time. He appears well-developed and well-nourished.  HENT:  Head: Normocephalic and atraumatic.  Eyes: Conjunctivae are normal.  Neck: Normal range of motion.  Cardiovascular: Normal rate, regular rhythm and intact distal pulses.   Pulmonary/Chest: Effort normal.  Abdominal: Soft. There is no tenderness.  Neurological: He is alert and oriented to person, place, and time.  No point tenderness to percussion of lumbar spinal processes.  No TTP or paraspinal muscular spasm. Strength is 5 out of 5 to bilateral lower extremities at hip and knee; extensor hallucis longus 5 out of 5. Ankle strength 5 out of 5, no clonus, neurovascularly intact. No saddle anaesthesia. Patellar reflexes are 2+ bilaterally.    Straight leg raise is negative bilaterally    Skin: Skin is warm and dry.  Psychiatric: He has a normal mood and affect.  Nursing note and vitals reviewed.    ED Treatments / Results  DIAGNOSTIC STUDIES: Oxygen Saturation is 100% on RA, normal by my interpretation.   COORDINATION OF CARE: 1:15 PM- Pt advised of plan for treatment and pt agrees. He will receive Toradol and Prednisone. Advised pt to follow up with his PCP.    Labs (all labs ordered are listed, but only abnormal results are displayed) Labs Reviewed - No data to display  EKG  EKG Interpretation None       Radiology No results found.  Procedures Procedures (including critical care time)  Medications Ordered in ED Medications - No data to display   Initial Impression / Assessment and Plan / ED Course  Wynetta EmeryNicole Aylee Littrell, [name] has reviewed the triage vital signs and the nursing notes.  Pertinent labs & imaging results that were available during my care of the patient were reviewed by me and considered in my medical decision  making (see chart for details).  Clinical Course    Vitals:   09/14/16 1245  BP: 140/91  Pulse: 71  Resp: 16  Temp: 98.1 F (36.7 C)  TempSrc: Oral  SpO2: 100%    Xavier HeronHenry Galant is 45 y.o. male presenting with  back pain.  No neurological deficits and normal neuro exam.  Patient can walk but states is painful.  No loss of bowel or bladder control.  No concern for cauda equina.  No fever, night sweats, weight loss, h/o cancer, IVDU.  RICE protocol and pain medicine indicated and discussed with patient.  Evaluation does not show pathology that would require ongoing emergent intervention or inpatient treatment. Pt is hemodynamically stable and mentating appropriately. Discussed findings and plan with patient/guardian, who agrees with care plan. All questions answered. Return precautions discussed and outpatient  follow up given.      Final Clinical Impressions(s) / ED Diagnoses   Final diagnoses:  Lumbar radiculopathy    New Prescriptions Discharge Medication List as of 09/14/2016  1:17 PM    START taking these medications   Details  HYDROcodone-acetaminophen (NORCO/VICODIN) 5-325 MG tablet Take 1-2 tablets by mouth every 6 hours as needed for pain., Print    predniSONE (DELTASONE) 50 MG tablet Take 1 tablet daily with breakfast, Print       I personally performed the services described in this documentation, which was scribed in my presence. The recorded information has been reviewed and is accurate.    Wynetta Emery, [name] 09/14/16 1356    Laurence Spates, MD 09/14/16 [phone]

## 2016-09-14 NOTE — Discharge Instructions (Signed)
For pain control please take ibuprofen (also known as Motrin or Advil) 800mg (this is normally 4 over the counter pills) 3 times a day  for 5 days. Take with food to minimize stomach irritation. ° °Take vicodin for breakthrough pain, do not drink alcohol, drive, care for children or do other critical tasks while taking vicodin. ° °Please follow with your primary care doctor in the next 2 days for a check-up. They must obtain records for further management.  ° °Do not hesitate to return to the Emergency Department for any new, worsening or concerning symptoms.  ° ° °

## 2017-09-20 ENCOUNTER — Encounter (HOSPITAL_COMMUNITY): Payer: Self-pay | Admitting: Emergency Medicine

## 2017-09-20 ENCOUNTER — Emergency Department (HOSPITAL_COMMUNITY): Payer: No Typology Code available for payment source

## 2017-09-20 ENCOUNTER — Emergency Department (HOSPITAL_COMMUNITY)
Admission: EM | Admit: 2017-09-20 | Discharge: 2017-09-20 | Disposition: A | Discharge status: Home / Self Care | Payer: No Typology Code available for payment source | Attending: Emergency Medicine | Admitting: Emergency Medicine

## 2017-09-20 ENCOUNTER — Other Ambulatory Visit: Payer: Self-pay

## 2017-09-20 DIAGNOSIS — Z79899 Other long term (current) drug therapy: Secondary | ICD-10-CM | POA: Insufficient documentation

## 2017-09-20 DIAGNOSIS — M25461 Effusion, right knee: Secondary | ICD-10-CM | POA: Insufficient documentation

## 2017-09-20 DIAGNOSIS — I10 Essential (primary) hypertension: Secondary | ICD-10-CM | POA: Insufficient documentation

## 2017-09-20 DIAGNOSIS — F17210 Nicotine dependence, cigarettes, uncomplicated: Secondary | ICD-10-CM | POA: Insufficient documentation

## 2017-09-20 MED ORDER — DICLOFENAC SODIUM 50 MG PO TBEC: 50.0000 mg | DELAYED_RELEASE_TABLET | Freq: Two times a day (BID) | ORAL | 0 refills | Status: DC

## 2017-09-20 MED ORDER — HYDROCODONE-ACETAMINOPHEN 5-325 MG PO TABS
1.0000 | ORAL_TABLET | Freq: Once | ORAL | Status: AC
  Administered 2017-09-20: 1 via ORAL
  Filled 2017-09-20: qty 1

## 2017-09-20 NOTE — ED Provider Notes (Signed)
[facility]-EMERGENCY DEPT Provider Note   CSN: [id number] Arrival date & time: 09/20/17  1133     History   Chief Complaint Chief Complaint  Patient presents with  . Knee Pain    HPI Clayborn HeronHenry Santana is a 46 y.o. male who presents to the ED with knee pain. Patient reports he turned quickly but his knee did not turn. He felt immediate pain. The injury occurred one week ago and patient continues to have pain and swelling to the area. Patient is not taking pain medication.   HPI  Past Medical History:  Diagnosis Date  . Alcoholic (HCC)   . Hypertension     There are no active problems to display for this patient.   Past Surgical History:  Procedure Laterality Date  . dislocated hip    . HAND SURGERY    . KNEE SURGERY         Home Medications    Prior to Admission medications   Medication Sig Start Date End Date Taking? Authorizing Provider  ciprofloxacin (CIPRO) 500 MG tablet Take 1 tablet (500 mg total) by mouth 2 (two) times daily. One po bid x 5 days 04/13/16   Melene PlanFloyd, Dan, DO  diclofenac (VOLTAREN) 50 MG EC tablet Take 1 tablet (50 mg total) 2 (two) times daily by mouth. 09/20/17   Janne NapoleonNeese, Quentin Shorey M, NP  HYDROcodone-acetaminophen (NORCO/VICODIN) 5-325 MG tablet Take 1-2 tablets by mouth every 6 hours as needed for pain. 09/14/16   Pisciotta, Joni ReiningNicole, [name]  magnesium 30 MG tablet Take 1 tablet (30 mg total) by mouth 2 (two) times daily. Patient not taking: Reported on 04/13/2016 04/07/16   Jacalyn LefevreHaviland, Julie, MD  naproxen (NAPROSYN) 500 MG tablet Take 1 tablet (500 mg total) by mouth 3 (three) times daily with meals. Patient not taking: Reported on 04/07/2016 09/23/15   Elpidio AnisUpstill, Shari, [name]  potassium chloride (K-DUR) 10 MEQ tablet Take 1 tablet (10 mEq total) by mouth daily. 04/07/16   Jacalyn LefevreHaviland, Julie, MD  predniSONE (DELTASONE) 50 MG tablet Take 1 tablet daily with breakfast 09/14/16   Pisciotta, Joni ReiningNicole, [name]  traMADol (ULTRAM) 50 MG tablet Take 1 tablet (50  mg total) by mouth every 6 (six) hours as needed for severe pain. Patient not taking: Reported on 04/07/2016 09/26/15   Antony MaduraHumes, Kelly, [name]    Family History Family History  Problem Relation Age of Onset  . Cancer Mother   . Cancer Father     Social History Social History   Tobacco Use  . Smoking status: Current Every Day Smoker    Packs/day: 0.30    Types: Cigarettes  . Smokeless tobacco: Never Used  Substance Use Topics  . Alcohol use: Yes    Comment: 5th alcohol for at least 15 years, 08/28/2015 3 beers & 1/2 pint liquor daily  . Drug use: No     Allergies   Patient has no known allergies.   Review of Systems Review of Systems  Musculoskeletal: Positive for arthralgias and joint swelling.       Right knee pain  Skin: Negative for color change and wound.  All other systems reviewed and are negative.    Physical Exam Updated Vital Signs BP (!) 156/98 (BP Location: Right Arm)   Pulse 80   Temp 98.2 F (36.8 C) (Oral)   Resp 18   Wt 76.2 kg (168 lb)   SpO2 96%   BMI 25.54 kg/m   Physical Exam  Constitutional: He appears well-developed and well-nourished. No distress.  HENT:  Head: Normocephalic.  Eyes: EOM are normal.  Neck: Neck supple.  Cardiovascular: Normal rate.  Pulmonary/Chest: Effort normal.  Musculoskeletal:       Right knee: He exhibits swelling. He exhibits normal range of motion, no ecchymosis, no laceration, no erythema and normal alignment. Effusion: small. Tenderness found. MCL tenderness noted.  Pedal pulses 2+, adequate circulation. Swelling noted to the medial aspect of the right knee.  Neurological: He is alert.  Skin: Skin is warm and dry.  Psychiatric: He has a normal mood and affect.  Nursing note and vitals reviewed.    ED Treatments / Results  Labs (all labs ordered are listed, but only abnormal results are displayed) Labs Reviewed - No data to display  Radiology Dg Knee Complete 4 Views Right  Result Date:  09/20/2017 CLINICAL DATA:  Right knee pain/injury EXAM: RIGHT KNEE - COMPLETE 4+ VIEW COMPARISON:  None. FINDINGS: No fracture or dislocation is seen. The joint spaces are preserved. Visualized soft tissues are within normal limits. Suspected small suprapatellar knee joint effusion. IMPRESSION: Small suprapatellar knee joint effusion. Electronically Signed   By: Charline BillsSriyesh  Krishnan M.D.   On: 09/20/2017 12:31    Procedures Procedures (including critical care time)  Medications Ordered in ED Medications  HYDROcodone-acetaminophen (NORCO/VICODIN) 5-325 MG per tablet 1 tablet (1 tablet Oral Given 09/20/17 1246)     Initial Impression / Assessment and Plan / ED Course  I have reviewed the triage vital signs and the nursing notes. 46 y.o. male with right knee pain and swelling s/p injury that occurred one week ago stable for d/c without fracture or dislocation noted on x-ray and normal focal neuro exam. There is a small joint effusion, ace wrap, ice, elevation and NSAIDS. F/u with PCP. Return precautions discussed.   Final Clinical Impressions(s) / ED Diagnoses   Final diagnoses:  Effusion of right knee    ED Discharge Orders        Ordered    diclofenac (VOLTAREN) 50 MG EC tablet  2 times daily     09/20/17 [address]1255       Nastacia Raybuck, Curry[place]Hope M, NP 09/20/17 1906    Janne Napoleoneese, Saber Dickerman M, NP 09/20/17 1906    Nira Connardama, Pedro Eduardo, MD 09/26/17 1736

## 2017-09-20 NOTE — Discharge Instructions (Signed)
Follow up with your doctor or return here as needed.  °

## 2017-09-20 NOTE — ED Notes (Signed)
Bed: WTR7 Expected date:  Expected time:  Means of arrival:  Comments: 

## 2017-09-20 NOTE — ED Triage Notes (Signed)
Pt stated that he  turned quickly causing sharp pain in r/knee. Episode was one week ago. Tx x1 with ice. Hx of surgery on same knee

## 2017-12-20 ENCOUNTER — Encounter (HOSPITAL_COMMUNITY): Payer: Self-pay | Admitting: Emergency Medicine

## 2017-12-20 DIAGNOSIS — Z5321 Procedure and treatment not carried out due to patient leaving prior to being seen by health care provider: Secondary | ICD-10-CM | POA: Insufficient documentation

## 2017-12-20 MED ORDER — ONDANSETRON 4 MG PO TBDP: 4.0000 mg | ORAL_TABLET | Freq: Once | ORAL | Status: DC | PRN

## 2017-12-20 NOTE — ED Triage Notes (Addendum)
Pt reports he ate a fish sandwich on Monday. Since then he has had n/v/d. Pt refuses labwork or nausea medication. States he feels better than earlier in the week. Needs a work note to return to work. Was able to keep down a bowl of soup earlier today.

## 2017-12-21 ENCOUNTER — Emergency Department (HOSPITAL_COMMUNITY)
Admission: EM | Admit: 2017-12-21 | Discharge: 2017-12-21 | Discharge status: Against Medical Advice | Payer: No Typology Code available for payment source | Attending: Emergency Medicine | Admitting: Emergency Medicine

## 2017-12-21 NOTE — ED Notes (Signed)
Called patient to go to a room and no answer 

## 2017-12-21 NOTE — ED Notes (Signed)
Pt told tech that he no longer wished to be seen.

## 2017-12-21 NOTE — ED Notes (Signed)
Called patient and no answer.

## 2017-12-21 NOTE — ED Notes (Signed)
Called patient to go to a room and answer.

## 2018-03-20 ENCOUNTER — Encounter (HOSPITAL_COMMUNITY): Payer: Self-pay | Admitting: Emergency Medicine

## 2018-03-20 ENCOUNTER — Other Ambulatory Visit: Payer: Self-pay

## 2018-03-20 ENCOUNTER — Emergency Department (HOSPITAL_COMMUNITY)
Admission: EM | Admit: 2018-03-20 | Discharge: 2018-03-21 | Disposition: A | Discharge status: Home / Self Care | Payer: No Typology Code available for payment source | Attending: Emergency Medicine | Admitting: Emergency Medicine

## 2018-03-20 ENCOUNTER — Emergency Department (HOSPITAL_COMMUNITY)
Admission: EM | Admit: 2018-03-20 | Discharge: 2018-03-20 | Discharge status: Against Medical Advice | Payer: No Typology Code available for payment source | Attending: Emergency Medicine | Admitting: Emergency Medicine

## 2018-03-20 DIAGNOSIS — F17210 Nicotine dependence, cigarettes, uncomplicated: Secondary | ICD-10-CM | POA: Insufficient documentation

## 2018-03-20 DIAGNOSIS — G43A0 Cyclical vomiting, not intractable: Secondary | ICD-10-CM | POA: Insufficient documentation

## 2018-03-20 DIAGNOSIS — I10 Essential (primary) hypertension: Secondary | ICD-10-CM | POA: Insufficient documentation

## 2018-03-20 DIAGNOSIS — Z5321 Procedure and treatment not carried out due to patient leaving prior to being seen by health care provider: Secondary | ICD-10-CM | POA: Insufficient documentation

## 2018-03-20 DIAGNOSIS — Z79899 Other long term (current) drug therapy: Secondary | ICD-10-CM | POA: Insufficient documentation

## 2018-03-20 DIAGNOSIS — M5442 Lumbago with sciatica, left side: Secondary | ICD-10-CM | POA: Insufficient documentation

## 2018-03-20 DIAGNOSIS — R1115 Cyclical vomiting syndrome unrelated to migraine: Secondary | ICD-10-CM

## 2018-03-20 DIAGNOSIS — M5432 Sciatica, left side: Secondary | ICD-10-CM

## 2018-03-20 LAB — CBC
HEMATOCRIT: 40.6 % (ref 39.0–52.0)
HEMOGLOBIN: 13.9 g/dL (ref 13.0–17.0)
MCH: 30 pg (ref 26.0–34.0)
MCHC: 34.2 g/dL (ref 30.0–36.0)
MCV: 87.7 fL (ref 78.0–100.0)
Platelets: 352 10*3/uL (ref 150–400)
RBC: 4.63 MIL/uL (ref 4.22–5.81)
RDW: 13.3 % (ref 11.5–15.5)
WBC: 7.1 10*3/uL (ref 4.0–10.5)

## 2018-03-20 NOTE — ED Notes (Signed)
No answer when called for vital signs 

## 2018-03-20 NOTE — ED Notes (Signed)
Bed: WA02 Expected date:  Expected time:  Means of arrival:  Comments: 

## 2018-03-20 NOTE — ED Notes (Signed)
Patient called to recollect blood with no answer.

## 2018-03-20 NOTE — ED Triage Notes (Signed)
Pt reports sciatica pain for the last week that radiates down to left knee. Pt reports cramping in legs

## 2018-03-20 NOTE — ED Triage Notes (Signed)
Pt reports that he having sciatica for about week on left side. Also c/o nausea without vomiting x 3 days.

## 2018-03-21 MED ORDER — LIDOCAINE 5 % EX PTCH: 1.0000 | MEDICATED_PATCH | CUTANEOUS | 0 refills | Status: DC

## 2018-03-21 MED ORDER — METHOCARBAMOL 500 MG PO TABS: 500.0000 mg | ORAL_TABLET | Freq: Two times a day (BID) | ORAL | 0 refills | Status: DC

## 2018-03-21 MED ORDER — ONDANSETRON 4 MG PO TBDP
4.0000 mg | ORAL_TABLET | Freq: Once | ORAL | Status: AC
  Administered 2018-03-21: 4 mg via ORAL
  Filled 2018-03-21: qty 1

## 2018-03-21 MED ORDER — ONDANSETRON 4 MG PO TBDP: 4.0000 mg | ORAL_TABLET | Freq: Three times a day (TID) | ORAL | 0 refills | Status: DC | PRN

## 2018-03-21 MED ORDER — PREDNISONE 10 MG (21) PO TBPK: ORAL_TABLET | ORAL | 0 refills | Status: DC

## 2018-03-21 NOTE — ED Notes (Signed)
Provided patient a cup of ice for his ginger ale.

## 2018-03-21 NOTE — ED Notes (Signed)
Pt able to drink the ginger ale at bedside

## 2018-03-21 NOTE — Discharge Instructions (Addendum)
Take it easy, but do not lay around too much as this may make any stiffness worse.  Antiinflammatory medications: Take 600 mg of ibuprofen every 6 hours or 440 mg (over the counter dose) to 500 mg (prescription dose) of naproxen every 12 hours for the next 3 days. After this time, these medications may be used as needed for pain. Take these medications with food to avoid upset stomach. Choose only one of these medications, do not take them together.  Tylenol: Should you continue to have additional pain while taking the ibuprofen or naproxen, you may add in tylenol as needed. Your daily total maximum amount of tylenol from all sources should be limited to /day for persons without liver problems, or /day for those with liver problems. Prednisone: Take the prednisone, as prescribed, until finished. Muscle relaxer: Robaxin is a muscle relaxer and may help loosen stiff muscles. Do not take the Robaxin while driving or performing other dangerous activities.  Lidocaine patches: These are available via either prescription or over-the-counter. The over-the-counter option may be more economical one and are likely just as effective. There are multiple over-the-counter brands, such as Salonpas. Zofran: Take Zofran, as needed, for nausea/vomiting. Exercises: Be sure to perform the attached exercises starting with three times a week and working up to performing them daily. This is an essential part of preventing long term problems.   Follow up with a primary care provider for any future management of these complaints.

## 2018-03-21 NOTE — ED Provider Notes (Signed)
[facility]-EMERGENCY DEPT Provider Note   CSN: [id number] Arrival date & time: 03/20/18  2201     History   Chief Complaint Chief Complaint  Patient presents with  . Leg Pain    HPI Xavier Owens is a 47 y.o. male.  HPI   Xavier Owens is a 47 y.o. male, with a history of HTN, presenting to the ED with acute on chronic left lower back and buttocks pain over the past 7 to 10 days.  Pain is 8/10, aching/cramping, worse with bending and lifting, radiating down the back of the left leg.  He has not taken any medications for his complaint.  He notes that he has been doing a lot more climbing, stooping, reaching, and twisting at work. Patient also complains of some nausea with a couple episodes of vomiting over the past 3 days.  He states he has been working long hours at work trying to meet deadlines and this makes him anxious and nauseous. Patient denies regular alcohol use.  Denies illicit drug use. Denies fever/chills, diarrhea, abdominal pain, falls/trauma, changes in bowel or bladder function, urinary symptoms, saddle anesthesias, neuro deficits, or any other complaints.     Past Medical History:  Diagnosis Date  . Alcoholic (HCC)   . Hypertension     There are no active problems to display for this patient.   Past Surgical History:  Procedure Laterality Date  . dislocated hip    . HAND SURGERY    . KNEE SURGERY          Home Medications    Prior to Admission medications   Medication Sig Start Date End Date Taking? Authorizing Provider  ciprofloxacin (CIPRO) 500 MG tablet Take 1 tablet (500 mg total) by mouth 2 (two) times daily. One po bid x 5 days 04/13/16   Melene Plan, DO  diclofenac (VOLTAREN) 50 MG EC tablet Take 1 tablet (50 mg total) 2 (two) times daily by mouth. 09/20/17   Janne Napoleon, NP  HYDROcodone-acetaminophen (NORCO/VICODIN) 5-325 MG tablet Take 1-2 tablets by mouth every 6 hours as needed for pain. 09/14/16   Pisciotta,  Joni Reining, [name]  lidocaine (LIDODERM) 5 % Place 1 patch onto the skin daily. Remove & Discard patch within 12 hours or as directed by MD 03/21/18   Joy, Shawn C, [name]  magnesium 30 MG tablet Take 1 tablet (30 mg total) by mouth 2 (two) times daily. Patient not taking: Reported on 04/13/2016 04/07/16   Jacalyn Lefevre, MD  methocarbamol (ROBAXIN) 500 MG tablet Take 1 tablet (500 mg total) by mouth 2 (two) times daily. 03/21/18   Joy, Shawn C, [name]  naproxen (NAPROSYN) 500 MG tablet Take 1 tablet (500 mg total) by mouth 3 (three) times daily with meals. Patient not taking: Reported on 04/07/2016 09/23/15   Elpidio Anis, [name]  ondansetron (ZOFRAN ODT) 4 MG disintegrating tablet Take 1 tablet (4 mg total) by mouth every 8 (eight) hours as needed for nausea or vomiting. 03/21/18   Joy, Shawn C, [name]  potassium chloride (K-DUR) 10 MEQ tablet Take 1 tablet (10 mEq total) by mouth daily. 04/07/16   Jacalyn Lefevre, MD  predniSONE (STERAPRED UNI-PAK 21 TAB) 10 MG (21) TBPK tablet Take 6 tabs ( ) on day 1, 5 tabs ( ) on day 2, 4 tabs ( ) on day 3, 3 tabs ( ) on day 4, 2 tabs ( ) on day 5, and 1 tab ( ) on day 6. 03/21/18   Joy, Shawn C, [name]  traMADol Janean Sark)  50 MG tablet Take 1 tablet (50 mg total) by mouth every 6 (six) hours as needed for severe pain. Patient not taking: Reported on 04/07/2016 09/26/15   Antony Madura, [name]    Family History Family History  Problem Relation Age of Onset  . Cancer Mother   . Cancer Father     Social History Social History   Tobacco Use  . Smoking status: Current Every Day Smoker    Packs/day: 0.30    Types: Cigarettes  . Smokeless tobacco: Never Used  Substance Use Topics  . Alcohol use: Yes    Comment: 5th alcohol for at least 15 years, 08/28/2015 3 beers & 1/2 pint liquor daily  . Drug use: No     Allergies   Patient has no known allergies.   Review of Systems Review of Systems  Constitutional: Negative for chills, diaphoresis and fever.    Respiratory: Negative for cough and shortness of breath.   Cardiovascular: Negative for chest pain.  Gastrointestinal: Positive for nausea and vomiting. Negative for abdominal pain and diarrhea.  Genitourinary: Negative for difficulty urinating, dysuria, frequency and hematuria.  Musculoskeletal: Positive for back pain.  Neurological: Negative for weakness and numbness.  All other systems reviewed and are negative.    Physical Exam Updated Vital Signs BP 119/86 (BP Location: Left Arm)   Pulse 96   Temp 98.1 F (36.7 C) (Oral)   Resp 18   Ht 5' 8.5" (1.74 m)   Wt 77.1 kg (170 lb)   SpO2 98%   BMI 25.47 kg/m   Physical Exam  Constitutional: He appears well-developed and well-nourished. No distress.  HENT:  Head: Normocephalic and atraumatic.  Eyes: Conjunctivae are normal.  Neck: Neck supple.  Cardiovascular: Normal rate, regular rhythm, normal heart sounds and intact distal pulses.  Pulmonary/Chest: Effort normal and breath sounds normal. No respiratory distress.  Abdominal: Soft. There is no tenderness. There is no guarding.  Musculoskeletal: He exhibits tenderness. He exhibits no edema.       Back:  Full range of motion through the cardinal directions of the left hip and knee.  Lymphadenopathy:    He has no cervical adenopathy.  Neurological: He is alert.  No noted acute sensory deficits, including no saddle anesthesias. Strength 5/5 with flexion and extension at the bilateral hips, knees, and ankles. Antalgic gait, but ambulatory without assistance. Coordination intact with heel to shin testing.  Skin: Skin is warm and dry. He is not diaphoretic.  Psychiatric: He has a normal mood and affect. His behavior is normal.  Nursing note and vitals reviewed.    ED Treatments / Results  Labs (all labs ordered are listed, but only abnormal results are displayed) Labs Reviewed - No data to display  EKG None  Radiology No results found.  Procedures Procedures  (including critical care time)  Medications Ordered in ED Medications  ondansetron (ZOFRAN-ODT) disintegrating tablet 4 mg (4 mg Oral Given 03/21/18 0300)     Initial Impression / Assessment and Plan / ED Course  I have reviewed the triage vital signs and the nursing notes.  Pertinent labs & imaging results that were available during my care of the patient were reviewed by me and considered in my medical decision making (see chart for details).     Patient presents with acute on chronic lower back pain with features of sciatica.  No neurologic deficits. Patient is nontoxic appearing, afebrile, not tachycardic, not tachypneic, not hypotensive, and is in no apparent distress.  Tolerating PO.  PCP follow up for any further management. The patient was given instructions for home care as well as return precautions. Patient voices understanding of these instructions, accepts the plan, and is comfortable with discharge.    Final Clinical Impressions(s) / ED Diagnoses   Final diagnoses:  Sciatica of left side  Non-intractable cyclical vomiting with nausea    ED Discharge Orders        Ordered    predniSONE (STERAPRED UNI-PAK 21 TAB) 10 MG (21) TBPK tablet     03/21/18 0318    methocarbamol (ROBAXIN) 500 MG tablet  2 times daily     03/21/18 0318    lidocaine (LIDODERM) 5 %  Every 24 hours     03/21/18 0318    ondansetron (ZOFRAN ODT) 4 MG disintegrating tablet  Every 8 hours PRN     03/21/18 0318       Anselm Pancoast, [name] 03/21/18 0325    Dione Booze, MD 03/21/18 [phone]

## 2019-07-26 ENCOUNTER — Other Ambulatory Visit: Payer: Self-pay

## 2019-07-26 DIAGNOSIS — Z20822 Contact with and (suspected) exposure to covid-19: Secondary | ICD-10-CM

## 2019-07-28 LAB — NOVEL CORONAVIRUS, NAA: SARS-CoV-2, NAA: NOT DETECTED

## 2021-05-03 ENCOUNTER — Emergency Department (HOSPITAL_COMMUNITY): Payer: Self-pay

## 2021-05-03 ENCOUNTER — Other Ambulatory Visit: Payer: Self-pay

## 2021-05-03 ENCOUNTER — Encounter (HOSPITAL_COMMUNITY): Payer: Self-pay

## 2021-05-03 ENCOUNTER — Emergency Department (HOSPITAL_COMMUNITY)
Admission: EM | Admit: 2021-05-03 | Discharge: 2021-05-03 | Disposition: A | Discharge status: Home / Self Care | Payer: Self-pay | Attending: Emergency Medicine | Admitting: Emergency Medicine

## 2021-05-03 DIAGNOSIS — Y900 Blood alcohol level of less than 20 mg/100 ml: Secondary | ICD-10-CM | POA: Insufficient documentation

## 2021-05-03 DIAGNOSIS — K2920 Alcoholic gastritis without bleeding: Secondary | ICD-10-CM

## 2021-05-03 DIAGNOSIS — R0789 Other chest pain: Secondary | ICD-10-CM | POA: Insufficient documentation

## 2021-05-03 DIAGNOSIS — I10 Essential (primary) hypertension: Secondary | ICD-10-CM | POA: Insufficient documentation

## 2021-05-03 DIAGNOSIS — R200 Anesthesia of skin: Secondary | ICD-10-CM | POA: Insufficient documentation

## 2021-05-03 DIAGNOSIS — R112 Nausea with vomiting, unspecified: Secondary | ICD-10-CM | POA: Insufficient documentation

## 2021-05-03 DIAGNOSIS — R0602 Shortness of breath: Secondary | ICD-10-CM | POA: Insufficient documentation

## 2021-05-03 DIAGNOSIS — R000 Tachycardia, unspecified: Secondary | ICD-10-CM | POA: Insufficient documentation

## 2021-05-03 DIAGNOSIS — F17210 Nicotine dependence, cigarettes, uncomplicated: Secondary | ICD-10-CM | POA: Insufficient documentation

## 2021-05-03 LAB — CBC
HCT: 42.7 % (ref 39.0–52.0)
Hemoglobin: 14.4 g/dL (ref 13.0–17.0)
MCH: 31.2 pg (ref 26.0–34.0)
MCHC: 33.7 g/dL (ref 30.0–36.0)
MCV: 92.4 fL (ref 80.0–100.0)
Platelets: 295 10*3/uL (ref 150–400)
RBC: 4.62 MIL/uL (ref 4.22–5.81)
RDW: 12.7 % (ref 11.5–15.5)
WBC: 10.9 10*3/uL — ABNORMAL HIGH (ref 4.0–10.5)
nRBC: 0 % (ref 0.0–0.2)

## 2021-05-03 LAB — ETHANOL: Alcohol, Ethyl (B): <10 mg/dL (ref <10)

## 2021-05-03 LAB — BASIC METABOLIC PANEL
Anion gap: 12 (ref 5–15)
BUN: 5 mg/dL — ABNORMAL LOW (ref 6–20)
CO2: 28 mmol/L (ref 22–32)
Calcium: 9 mg/dL (ref 8.9–10.3)
Chloride: 100 mmol/L (ref 98–111)
Creatinine, Ser: 0.97 mg/dL (ref 0.61–1.24)
GFR, Estimated: >60 mL/min (ref >60)
Glucose, Bld: 115 mg/dL — ABNORMAL HIGH (ref 70–99)
Potassium: 3.7 mmol/L (ref 3.5–5.1)
Sodium: 140 mmol/L (ref 135–145)

## 2021-05-03 LAB — TROPONIN I (HIGH SENSITIVITY)
Troponin I (High Sensitivity): 6 ng/L (ref <18)
Troponin I (High Sensitivity): 5 ng/L (ref <18)

## 2021-05-03 LAB — LIPASE, BLOOD: Lipase: 33 U/L (ref 11–51)

## 2021-05-03 LAB — D-DIMER, QUANTITATIVE: D-Dimer, Quant: 0.53 ug/mL-FEU — ABNORMAL HIGH (ref 0.00–0.50)

## 2021-05-03 MED ORDER — SODIUM CHLORIDE (PF) 0.9 % IJ SOLN
INTRAMUSCULAR | Status: AC
  Filled 2021-05-03: qty 50

## 2021-05-03 MED ORDER — IOHEXOL 350 MG/ML SOLN
100.0000 mL | Freq: Once | INTRAVENOUS | Status: AC | PRN
  Administered 2021-05-03: 75 mL via INTRAVENOUS

## 2021-05-03 MED ORDER — PANTOPRAZOLE SODIUM 20 MG PO TBEC: 20.0000 mg | DELAYED_RELEASE_TABLET | Freq: Every day | ORAL | 0 refills | Status: DC

## 2021-05-03 MED ORDER — PANTOPRAZOLE 80MG IVPB - SIMPLE MED
80.0000 mg | Freq: Once | INTRAVENOUS | Status: DC
  Filled 2021-05-03: qty 100

## 2021-05-03 MED ORDER — ONDANSETRON HCL 4 MG/2ML IJ SOLN
4.0000 mg | Freq: Once | INTRAMUSCULAR | Status: AC
  Administered 2021-05-03: 4 mg via INTRAVENOUS
  Filled 2021-05-03: qty 2

## 2021-05-03 MED ORDER — SUCRALFATE 1 G PO TABS: 1.0000 g | ORAL_TABLET | Freq: Four times a day (QID) | ORAL | 0 refills | Status: DC

## 2021-05-03 MED ORDER — MORPHINE SULFATE (PF) 4 MG/ML IV SOLN
4.0000 mg | Freq: Once | INTRAVENOUS | Status: AC
  Administered 2021-05-03: 4 mg via INTRAVENOUS
  Filled 2021-05-03: qty 1

## 2021-05-03 MED ORDER — SUCRALFATE 1 GM/10ML PO SUSP
1.0000 g | Freq: Once | ORAL | Status: AC
  Administered 2021-05-03: 1 g via ORAL
  Filled 2021-05-03: qty 10

## 2021-05-03 MED ORDER — SODIUM CHLORIDE 0.9 % IV BOLUS
500.0000 mL | Freq: Once | INTRAVENOUS | Status: AC
  Administered 2021-05-03: 500 mL via INTRAVENOUS

## 2021-05-03 MED ORDER — FAMOTIDINE IN NACL 20-0.9 MG/50ML-% IV SOLN
20.0000 mg | Freq: Once | INTRAVENOUS | Status: AC
  Administered 2021-05-03: 20 mg via INTRAVENOUS
  Filled 2021-05-03: qty 50

## 2021-05-03 MED ORDER — METOCLOPRAMIDE HCL 5 MG/ML IJ SOLN: 10.0000 mg | Freq: Once | INTRAMUSCULAR | Status: DC

## 2021-05-03 MED ORDER — PANTOPRAZOLE INFUSION (NEW) - SIMPLE MED
8.0000 mg/h | INTRAVENOUS | Status: DC
Start: 2021-05-03 — End: 2021-05-04
  Administered 2021-05-03: 8 mg/h via INTRAVENOUS
  Filled 2021-05-03: qty 80

## 2021-05-03 NOTE — ED Provider Notes (Signed)
Patient signed out to me by Dr. Jeraldine Loots pending results of troponin which came back fine.  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  Will discharge   Lorre Nick, MD 05/03/21 1910

## 2021-05-03 NOTE — ED Triage Notes (Addendum)
Patient c/o left chest pain, SOB, and right hand numbness since 0400 today.  Patient began actively vomiting in triage.

## 2021-05-03 NOTE — ED Notes (Signed)
An After Visit Summary was printed and given to the patient. Discharge instructions given and no further questions at this time.  

## 2021-05-03 NOTE — ED Provider Notes (Signed)
[facility]-EMERGENCY DEPT Provider Note   CSN: [id number] Arrival date & time: 05/03/21  1106     History Chief Complaint  Patient presents with   Shortness of Breath   Chest Pain   Numbness   Emesis    Xavier Owens is a 50 y.o. male.  HPI Patient presents with chest pain, dyspnea.  Onset was 8 hours ago, without obvious precipitant.  He notes a history of hypertension, does not take medication regularly. He was in his usual state of health prior to the onset.  Since that time he has had pain described as tightness, focally around the infra sternal region, no radiation.  Symptoms are worse with activity, not with inspiration.  No lower abdominal pain.  There is nausea, but no vomiting.  No syncope, no fall. He is taking no medication for relief.     Past Medical History:  Diagnosis Date   Alcoholic (HCC)    Hypertension     There are no problems to display for this patient.   Past Surgical History:  Procedure Laterality Date   dislocated hip     HAND SURGERY     KNEE SURGERY         Family History  Problem Relation Age of Onset   Cancer Mother    Cancer Father     Social History   Tobacco Use   Smoking status: Every Day    Packs/day: 0.10    Pack years: 0.00    Types: Cigarettes   Smokeless tobacco: Never  Vaping Use   Vaping Use: Never used  Substance Use Topics   Alcohol use: Yes   Drug use: No    Home Medications Prior to Admission medications   Medication Sig Start Date End Date Taking? Authorizing Provider  ciprofloxacin (CIPRO) 500 MG tablet Take 1 tablet (500 mg total) by mouth 2 (two) times daily. One po bid x 5 days 04/13/16   Melene Plan, DO  diclofenac (VOLTAREN) 50 MG EC tablet Take 1 tablet (50 mg total) 2 (two) times daily by mouth. 09/20/17   Janne Napoleon, NP  HYDROcodone-acetaminophen (NORCO/VICODIN) 5-325 MG tablet Take 1-2 tablets by mouth every 6 hours as needed for pain. 09/14/16   Pisciotta, Joni Reining, [name]   lidocaine (LIDODERM) 5 % Place 1 patch onto the skin daily. Remove & Discard patch within 12 hours or as directed by MD 03/21/18   Joy, Shawn C, [name]  magnesium 30 MG tablet Take 1 tablet (30 mg total) by mouth 2 (two) times daily. Patient not taking: Reported on 04/13/2016 04/07/16   Jacalyn Lefevre, MD  methocarbamol (ROBAXIN) 500 MG tablet Take 1 tablet (500 mg total) by mouth 2 (two) times daily. 03/21/18   Joy, Shawn C, [name]  naproxen (NAPROSYN) 500 MG tablet Take 1 tablet (500 mg total) by mouth 3 (three) times daily with meals. Patient not taking: Reported on 04/07/2016 09/23/15   Elpidio Anis, [name]  ondansetron (ZOFRAN ODT) 4 MG disintegrating tablet Take 1 tablet (4 mg total) by mouth every 8 (eight) hours as needed for nausea or vomiting. 03/21/18   Joy, Shawn C, [name]  potassium chloride (K-DUR) 10 MEQ tablet Take 1 tablet (10 mEq total) by mouth daily. 04/07/16   Jacalyn Lefevre, MD  predniSONE (STERAPRED UNI-PAK 21 TAB) 10 MG (21) TBPK tablet Take 6 tabs (60mg ) on day 1, 5 tabs (50mg ) on day 2, 4 tabs (40mg ) on day 3, 3 tabs (30mg ) on day 4, 2 tabs (20mg )  on day 5, and 1 tab (10mg ) on day 6. 03/21/18   Joy, Shawn C, [name]  traMADol (ULTRAM) 50 MG tablet Take 1 tablet (50 mg total) by mouth every 6 (six) hours as needed for severe pain. Patient not taking: Reported on 04/07/2016 09/26/15   13/12/16, [name]    Allergies    Patient has no known allergies.  Review of Systems   Review of Systems  Constitutional:        Per HPI, otherwise negative  HENT:         Per HPI, otherwise negative  Respiratory:         Per HPI, otherwise negative  Cardiovascular:        Per HPI, otherwise negative  Gastrointestinal:  Negative for vomiting.  Endocrine:       Negative aside from HPI  Genitourinary:        Neg aside from HPI   Musculoskeletal:        Per HPI, otherwise negative  Skin: Negative.   Neurological:  Negative for syncope.   Physical Exam Updated Vital Signs BP (!) 147/118   Pulse  82   Temp 97.9 F (36.6 C) (Oral)   Resp 12   Ht 5\' 8"  (1.727 m)   Wt 88.5 kg   SpO2 97%   BMI 29.65 kg/m   Physical Exam Vitals and nursing note reviewed.  Constitutional:      General: He is not in acute distress.    Appearance: He is well-developed.  HENT:     Head: Normocephalic and atraumatic.  Eyes:     Conjunctiva/sclera: Conjunctivae normal.  Cardiovascular:     Rate and Rhythm: Regular rhythm. Tachycardia present.  Pulmonary:     Effort: Pulmonary effort is normal. Tachypnea present. No respiratory distress.     Breath sounds: No stridor.  Abdominal:     General: There is no distension.     Palpations: Abdomen is soft. There is no mass.     Tenderness: There is no abdominal tenderness. There is no guarding.  Skin:    General: Skin is warm and dry.  Neurological:     Mental Status: He is alert and oriented to person, place, and time.    ED Results / Procedures / Treatments   Labs (all labs ordered are listed, but only abnormal results are displayed) Labs Reviewed  BASIC METABOLIC PANEL - Abnormal; Notable for the following components:      Result Value   Glucose, Bld 115 (*)    BUN 5 (*)    All other components within normal limits  CBC - Abnormal; Notable for the following components:   WBC 10.9 (*)    All other components within normal limits  D-DIMER, QUANTITATIVE - Abnormal; Notable for the following components:   D-Dimer, Quant 0.53 (*)    All other components within normal limits  LIPASE, BLOOD  ETHANOL  TROPONIN I (HIGH SENSITIVITY)  TROPONIN I (HIGH SENSITIVITY)    EKG EKG Interpretation  Date/Time:  Monday May 03 2021 11:17:48 EDT Ventricular Rate:  106 PR Interval:  158 QRS Duration: 92 QT Interval:  344 QTC Calculation: 457 R Axis:   -22 Text Interpretation: Sinus tachycardia LAE, consider biatrial enlargement Inferior infarct, old 12 Lead; Mason-Likar Abnormal ECG Confirmed by Sunday [phone]) on 05/03/2021 11:51:12  AM  Radiology DG Chest 2 View  Result Date: 05/03/2021 CLINICAL DATA:  Chest pain and shortness of breath. EXAM: CHEST - 2 VIEW COMPARISON:  06/10/2015  FINDINGS: Asymmetric elevation right hemidiaphragm. The lungs are clear without focal pneumonia, edema, pneumothorax or pleural effusion. The cardiopericardial silhouette is within normal limits for size. The visualized bony structures of the thorax show no acute abnormality. Telemetry leads overlie the chest. IMPRESSION: No active cardiopulmonary disease. Electronically Signed   By: Kennith Center M.D.   On: 05/03/2021 11:59   CT Angio Chest PE W and/or Wo Contrast  Result Date: 05/03/2021 CLINICAL DATA:  50 year old male with shortness of breath, chest pain and positive D-dimer. EXAM: CT ANGIOGRAPHY CHEST WITH CONTRAST TECHNIQUE: Multidetector CT imaging of the chest was performed using the standard protocol during bolus administration of intravenous contrast. Multiplanar CT image reconstructions and MIPs were obtained to evaluate the vascular anatomy. CONTRAST:  23mL OMNIPAQUE IOHEXOL 350 MG/ML SOLN COMPARISON:  05/03/2021 chest radiograph and prior studies FINDINGS: Cardiovascular: This is a technically adequate study but respiratory motion artifact decreases sensitivity in some portions of the lungs. No pulmonary emboli are identified. There is no evidence of thoracic aortic aneurysm or pericardial effusion. Heart size is normal. Mediastinum/Nodes: No enlarged mediastinal, hilar, or axillary lymph nodes. Thyroid gland, trachea, and esophagus demonstrate no significant findings. Lungs/Pleura: Lungs are clear. No pleural effusion or pneumothorax. Upper Abdomen: Hepatic steatosis identified. Musculoskeletal: No acute or suspicious bony abnormalities are noted. Review of the MIP images confirms the above findings. IMPRESSION: 1. No evidence of acute abnormality. No evidence of pulmonary emboli. 2. Hepatic steatosis. Electronically Signed   By: Harmon Pier  M.D.   On: 05/03/2021 15:46    Procedures Procedures   Medications Ordered in ED Medications  pantoprazole (PROTONIX) 80 mg /NS 100 mL IVPB (has no administration in time range)  pantoprozole (PROTONIX) 80 mg /NS 100 mL infusion (8 mg/hr Intravenous New Bag/Given 05/03/21 1702)  morphine 4 MG/ML injection 4 mg (4 mg Intravenous Given 05/03/21 1223)  ondansetron (ZOFRAN) injection 4 mg (4 mg Intravenous Given 05/03/21 1222)  sodium chloride 0.9 % bolus 500 mL (0 mLs Intravenous Stopped 05/03/21 1412)  sucralfate (CARAFATE) 1 GM/10ML suspension 1 g (1 g Oral Given 05/03/21 1225)  famotidine (PEPCID) IVPB 20 mg premix (0 mg Intravenous Stopped 05/03/21 1412)  iohexol (OMNIPAQUE) 350 MG/ML injection 100 mL (75 mLs Intravenous Contrast Given 05/03/21 1508)  sodium chloride (PF) 0.9 % injection (  Given by Other 05/03/21 1523)    ED Course  I have reviewed the triage vital signs and the nursing notes.  Pertinent labs & imaging results that were available during my care of the patient were reviewed by me and considered in my medical decision making (see chart for details).  Cardiac monitor 110 sinus tach abnormal Pulse ox 98% room air normal  5:05 PM Patient in no distress, awake and alert.  Pain seems somewhat better.  I reviewed findings thus far, including CT angiography without pulmonary embolism, labs generally unremarkable, initial troponin normal, minimal leukocytosis. No x-ray, CT evidence for pneumonia, no other acute intrathoracic phenomena. Some suspicion for the patient's alcohol intake, which he describes as 1 pint per day contributing to gastroesophagitis. Patient may be appropriate for discharge versus admission based on pending lab results.  Dr. Freida Busman aware. Final Clinical Impression(s) / ED Diagnoses Final diagnoses:  Atypical chest pain     Gerhard Munch, MD 05/03/21 1706

## 2021-06-22 ENCOUNTER — Emergency Department (HOSPITAL_COMMUNITY): Payer: Self-pay

## 2021-06-22 ENCOUNTER — Encounter (HOSPITAL_COMMUNITY): Payer: Self-pay

## 2021-06-22 ENCOUNTER — Observation Stay (HOSPITAL_COMMUNITY)
Admission: EM | Admit: 2021-06-22 | Discharge: 2021-06-23 | Disposition: A | Discharge status: Home / Self Care | Payer: Self-pay | Attending: Student in an Organized Health Care Education/Training Program | Admitting: Student in an Organized Health Care Education/Training Program

## 2021-06-22 DIAGNOSIS — F10231 Alcohol dependence with withdrawal delirium: Principal | ICD-10-CM | POA: Insufficient documentation

## 2021-06-22 DIAGNOSIS — R748 Abnormal levels of other serum enzymes: Secondary | ICD-10-CM | POA: Diagnosis present

## 2021-06-22 DIAGNOSIS — F1020 Alcohol dependence, uncomplicated: Secondary | ICD-10-CM | POA: Diagnosis present

## 2021-06-22 DIAGNOSIS — F10239 Alcohol dependence with withdrawal, unspecified: Secondary | ICD-10-CM

## 2021-06-22 DIAGNOSIS — F10930 Alcohol use, unspecified with withdrawal, uncomplicated: Secondary | ICD-10-CM

## 2021-06-22 DIAGNOSIS — K759 Inflammatory liver disease, unspecified: Secondary | ICD-10-CM | POA: Insufficient documentation

## 2021-06-22 DIAGNOSIS — R569 Unspecified convulsions: Secondary | ICD-10-CM

## 2021-06-22 DIAGNOSIS — Y900 Blood alcohol level of less than 20 mg/100 ml: Secondary | ICD-10-CM | POA: Insufficient documentation

## 2021-06-22 DIAGNOSIS — R000 Tachycardia, unspecified: Secondary | ICD-10-CM | POA: Insufficient documentation

## 2021-06-22 DIAGNOSIS — Z79899 Other long term (current) drug therapy: Secondary | ICD-10-CM | POA: Insufficient documentation

## 2021-06-22 DIAGNOSIS — Z7289 Other problems related to lifestyle: Secondary | ICD-10-CM

## 2021-06-22 DIAGNOSIS — I10 Essential (primary) hypertension: Secondary | ICD-10-CM | POA: Diagnosis present

## 2021-06-22 DIAGNOSIS — R7401 Elevation of levels of liver transaminase levels: Secondary | ICD-10-CM | POA: Insufficient documentation

## 2021-06-22 DIAGNOSIS — F10230 Alcohol dependence with withdrawal, uncomplicated: Secondary | ICD-10-CM

## 2021-06-22 DIAGNOSIS — Z20822 Contact with and (suspected) exposure to covid-19: Secondary | ICD-10-CM | POA: Insufficient documentation

## 2021-06-22 DIAGNOSIS — F17210 Nicotine dependence, cigarettes, uncomplicated: Secondary | ICD-10-CM | POA: Insufficient documentation

## 2021-06-22 DIAGNOSIS — R519 Headache, unspecified: Secondary | ICD-10-CM | POA: Diagnosis present

## 2021-06-22 LAB — ETHANOL: Alcohol, Ethyl (B): <10 mg/dL (ref <10)

## 2021-06-22 LAB — SALICYLATE LEVEL: Salicylate Lvl: <7 mg/dL — ABNORMAL LOW (ref 7.0–30.0)

## 2021-06-22 LAB — CBC WITH DIFFERENTIAL/PLATELET
Abs Immature Granulocytes: 0.04 10*3/uL (ref 0.00–0.07)
Basophils Absolute: 0.1 10*3/uL (ref 0.0–0.1)
Basophils Relative: 1 %
Eosinophils Absolute: 0 10*3/uL (ref 0.0–0.5)
Eosinophils Relative: 0 %
HCT: 41.7 % (ref 39.0–52.0)
Hemoglobin: 13.7 g/dL (ref 13.0–17.0)
Immature Granulocytes: 0 %
Lymphocytes Relative: 6 %
Lymphs Abs: 0.7 10*3/uL (ref 0.7–4.0)
MCH: 31.2 pg (ref 26.0–34.0)
MCHC: 32.9 g/dL (ref 30.0–36.0)
MCV: 95 fL (ref 80.0–100.0)
Monocytes Absolute: 0.5 10*3/uL (ref 0.1–1.0)
Monocytes Relative: 4 %
Neutro Abs: 9.9 10*3/uL — ABNORMAL HIGH (ref 1.7–7.7)
Neutrophils Relative %: 89 %
Platelets: 289 10*3/uL (ref 150–400)
RBC: 4.39 MIL/uL (ref 4.22–5.81)
RDW: 13 % (ref 11.5–15.5)
WBC: 11.1 10*3/uL — ABNORMAL HIGH (ref 4.0–10.5)
nRBC: 0 % (ref 0.0–0.2)

## 2021-06-22 LAB — COMPREHENSIVE METABOLIC PANEL
ALT: 46 U/L — ABNORMAL HIGH (ref 0–44)
AST: 99 U/L — ABNORMAL HIGH (ref 15–41)
Albumin: 3.6 g/dL (ref 3.5–5.0)
Alkaline Phosphatase: 109 U/L (ref 38–126)
Anion gap: 15 (ref 5–15)
BUN: 5 mg/dL — ABNORMAL LOW (ref 6–20)
CO2: 19 mmol/L — ABNORMAL LOW (ref 22–32)
Calcium: 8.7 mg/dL — ABNORMAL LOW (ref 8.9–10.3)
Chloride: 101 mmol/L (ref 98–111)
Creatinine, Ser: 0.96 mg/dL (ref 0.61–1.24)
GFR, Estimated: >60 mL/min (ref >60)
Glucose, Bld: 176 mg/dL — ABNORMAL HIGH (ref 70–99)
Potassium: 4.9 mmol/L (ref 3.5–5.1)
Sodium: 135 mmol/L (ref 135–145)
Total Bilirubin: 1 mg/dL (ref 0.3–1.2)
Total Protein: 7.7 g/dL (ref 6.5–8.1)

## 2021-06-22 LAB — RESP PANEL BY RT-PCR (FLU A&B, COVID) ARPGX2
Influenza A by PCR: NEGATIVE
Influenza B by PCR: NEGATIVE
SARS Coronavirus 2 by RT PCR: NEGATIVE

## 2021-06-22 LAB — ACETAMINOPHEN LEVEL: Acetaminophen (Tylenol), Serum: <10 ug/mL — ABNORMAL LOW (ref 10–30)

## 2021-06-22 MED ORDER — THIAMINE HCL 100 MG PO TABS
100.0000 mg | ORAL_TABLET | Freq: Every day | ORAL | Status: DC
  Administered 2021-06-22 – 2021-06-23 (×2): 100 mg via ORAL
  Filled 2021-06-22 (×2): qty 1

## 2021-06-22 MED ORDER — LORAZEPAM 2 MG/ML IJ SOLN
1.0000 mg | Freq: Once | INTRAMUSCULAR | Status: AC
  Administered 2021-06-22: 1 mg via INTRAVENOUS
  Filled 2021-06-22: qty 1

## 2021-06-22 MED ORDER — SODIUM CHLORIDE 0.9 % IV BOLUS
1000.0000 mL | Freq: Once | INTRAVENOUS | Status: AC
  Administered 2021-06-22: 1000 mL via INTRAVENOUS

## 2021-06-22 MED ORDER — LORAZEPAM 2 MG/ML IJ SOLN
2.0000 mg | Freq: Once | INTRAMUSCULAR | Status: AC
  Administered 2021-06-22: 2 mg via INTRAVENOUS
  Filled 2021-06-22: qty 1

## 2021-06-22 MED ORDER — ADULT MULTIVITAMIN W/MINERALS CH
1.0000 | ORAL_TABLET | Freq: Every day | ORAL | Status: DC
  Administered 2021-06-22 – 2021-06-23 (×2): 1 via ORAL
  Filled 2021-06-22 (×2): qty 1

## 2021-06-22 MED ORDER — ENOXAPARIN SODIUM 40 MG/0.4ML IJ SOSY
40.0000 mg | PREFILLED_SYRINGE | INTRAMUSCULAR | Status: DC
  Administered 2021-06-22: 40 mg via SUBCUTANEOUS
  Filled 2021-06-22: qty 0.4

## 2021-06-22 MED ORDER — LACTATED RINGERS IV SOLN: INTRAVENOUS | Status: AC

## 2021-06-22 MED ORDER — POLYETHYLENE GLYCOL 3350 17 G PO PACK: 17.0000 g | PACK | Freq: Every day | ORAL | Status: DC | PRN

## 2021-06-22 MED ORDER — PANTOPRAZOLE SODIUM 40 MG PO TBEC
40.0000 mg | DELAYED_RELEASE_TABLET | Freq: Every day | ORAL | Status: DC
  Administered 2021-06-23: 40 mg via ORAL
  Filled 2021-06-22 (×2): qty 1

## 2021-06-22 MED ORDER — LORAZEPAM 1 MG PO TABS
1.0000 mg | ORAL_TABLET | ORAL | Status: DC | PRN
  Administered 2021-06-22 – 2021-06-23 (×2): 1 mg via ORAL
  Filled 2021-06-22 (×2): qty 1

## 2021-06-22 MED ORDER — ONDANSETRON HCL 4 MG/2ML IJ SOLN: 4.0000 mg | Freq: Four times a day (QID) | INTRAMUSCULAR | Status: DC | PRN

## 2021-06-22 MED ORDER — CHLORDIAZEPOXIDE HCL 25 MG PO CAPS
50.0000 mg | ORAL_CAPSULE | Freq: Two times a day (BID) | ORAL | Status: DC
  Administered 2021-06-22 (×2): 50 mg via ORAL
  Filled 2021-06-22 (×2): qty 10
  Filled 2021-06-22: qty 2

## 2021-06-22 MED ORDER — ACETAMINOPHEN 650 MG RE SUPP: 650.0000 mg | Freq: Four times a day (QID) | RECTAL | Status: DC | PRN

## 2021-06-22 MED ORDER — LORAZEPAM 2 MG/ML IJ SOLN
1.0000 mg | INTRAMUSCULAR | Status: DC | PRN
  Administered 2021-06-22 (×2): 2 mg via INTRAVENOUS
  Filled 2021-06-22 (×2): qty 1

## 2021-06-22 MED ORDER — FOLIC ACID 1 MG PO TABS
1.0000 mg | ORAL_TABLET | Freq: Every day | ORAL | Status: DC
  Administered 2021-06-22 – 2021-06-23 (×2): 1 mg via ORAL
  Filled 2021-06-22 (×2): qty 1

## 2021-06-22 MED ORDER — ACETAMINOPHEN 325 MG PO TABS: 650.0000 mg | ORAL_TABLET | Freq: Four times a day (QID) | ORAL | Status: DC | PRN

## 2021-06-22 MED ORDER — ONDANSETRON HCL 4 MG PO TABS: 4.0000 mg | ORAL_TABLET | Freq: Four times a day (QID) | ORAL | Status: DC | PRN

## 2021-06-22 NOTE — ED Notes (Signed)
Patient is resting comfortably. 

## 2021-06-22 NOTE — H&P (Signed)
Date: 06/22/2021               Patient Name:  Xavier Owens MRN: [id number]  DOB: 1971/01/19 Age / Sex: 51 y.o., male   PCP: Gus Rankin, [name]         Medical Service: Internal Medicine Teaching Service         Attending Physician: Dr. Oswaldo Done, Marquita Palms, *    First Contact: Dr. Allena Katz Pager: [phone]  Second Contact: Dr. Huel Cote Pager: [phone]       After Hours (After 5p/  First Contact Pager: [phone]  weekends / holidays): Second Contact Pager: [phone]   Chief Complaint: Seizure  History of Present Illness:   Xavier Owens is a 50 y/o male with a PMHx of AUD, HTN who presents to the ED with c/o seizure. His partner, [name], is present at bedside to assist with history as patient is lethargic.    [name] states he cannot recall any events over the last 2 days. He can remember going to visit [name] in the hospital on 8/7 though, as she was admitted at that time. [name] states she was discharged home yesterday afternoon. When she arrived home, she noticed Xavier Owens seemed more intoxicated than usual. [name] states patient usually keeps alcohol on himself 24/7 and generally wakes up every few hours to drink throughout the night. She did not notice him do that last night. This morning, he awoke around 5:30 and had a small glass of liquor. Immediately after returning to bed, he became stiff suddenly and began grunting. He had foam-like saliva coming out of his mouth that was blood-tinged. [name] cannot recall how long this episode lasted, but when stiffness resolved, patient attempted to stand up and fell. He appear confused and unsteady.  He did not have any urinary or bowel incontinence.  Xavier Owens denies any previous history of seizure or seizure-like activity.  Patient told [name] that he had COVID last week but he does not verify this today.  She states that he took a test on 8/6 that was negative.  Otherwise, Xavier Owens denies any  fever, chills, nausea, vomiting, abdominal pain.  He denies any other recent illness.  [name] suspects that patient may have "been on a bender" over the last 9 days while she was in the hospital. Xavier Owens cannot recall if he has been drinking more. He describes a 25 year history of alcohol use disorder. He is currently drinking at least 1/5 of liquor daily, some of which is moonshine. He does not drink beer or wine.  He has only tried detoxing from alcohol once prior and that was in a medical facility.   ED Course:  On arrival to the ED, patient was noted to be tachycardic at 111 with hypertension at 141/90.  He was mildly tachypneic with respiratory rate of 28, saturating at 98% on room air.  Initial lab work was remarkable for an elevated glucose of 176, elevated liver enzymes (AST of 99 and ALT of 46).  Patient had a mild leukocytosis at 11.1.  Tylenol, salicylic acid and ethanol levels were negative.  CT head was obtained and negative.  [facility] was consulted for admission.  Meds:  Multi-vitamin  Allergies: Allergies as of 06/22/2021   (No Known Allergies)   Past Medical History:  Diagnosis Date   Alcoholic (HCC)    Hypertension    Family History:  Family History  Problem Relation Age of Onset   Cancer Mother  Cancer Father    Social History:  - Xavier Owens currently lives in [place] with his partner [name].  No one else lives with them at this time. 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   Physical Exam: Blood pressure (!) 157/98, pulse (!) 107, temperature 98.1 F (36.7 C), temperature source Oral, resp. rate (!) 25, height 5\' 8"  (1.727 m), weight 88.5 kg, SpO2  94 %.  Physical Exam Vitals and nursing note reviewed.  Constitutional:      General: He is not in acute distress.    Appearance: He is well-developed.     Interventions: Face mask in place.  HENT:     Head: Normocephalic and atraumatic.     Mouth/Throat:     Mouth: Mucous membranes are dry.     Pharynx: Oropharynx is clear.     Comments: Abrasion on the right side of the tongue, lateral aspect Eyes:     General: No scleral icterus.    Extraocular Movements: Extraocular movements intact.     Conjunctiva/sclera: Conjunctivae normal.     Pupils: Pupils are equal, round, and reactive to light.  Cardiovascular:     Rate and Rhythm: Regular rhythm. Tachycardia present.     Heart sounds: No murmur heard.   No gallop.  Pulmonary:     Effort: Pulmonary effort is normal. No respiratory distress.     Breath sounds: No wheezing, rhonchi or rales.  Abdominal:     General: Bowel sounds are normal. There is no distension.     Palpations: Abdomen is soft. There is no mass.     Tenderness: There is no abdominal tenderness. There is no guarding.  Musculoskeletal:        General: Tenderness (Tenderness to palpation overlying the left greater trochanter, in addition to the left deltoid.) present.     Left upper arm: Tenderness present. No swelling, edema, deformity, lacerations or bony tenderness.     Left hip: Tenderness present. No deformity. Normal range of motion.     Right lower leg: No edema.     Left lower leg: No edema.  Skin:    General: Skin is warm and dry.     Findings: No bruising, erythema, lesion, petechiae or rash.  Neurological:     Mental Status: He is easily aroused. He is lethargic.     Comments: 4 out of 5 strength in bilateral upper and lower extremities.  Able to follow commands.  Cranial nerves intact.  Oriented to person, place, situation and month, however not date or day of the week.  Mild tremor noted with motion.  No asterixis   Assessment & Plan by  Problem: Active Problems:   Alcohol withdrawal seizure [facility])  Xavier Owens is a 50 year old gentleman with a past medical history of severe alcohol use disorder currently admitted for seizure secondary to alcohol withdrawal.  # Alcohol Withdrawal Seizure  Likely etiology of seizure is secondary to alcohol withdrawal given that patient went a prolonged period of time without use.  His partner notes that he generally wakes up every couple hours to drink and did not do so last night.  Ethanol level negative on admission.  Low suspicion for infectious etiology at  this time.   Patient is experiencing muscle tenderness at this time, so we will evaluate for rhabdomyolysis, however there is no evidence of renal dysfunction.  - Admit to progressive floor given high risk of delirium tremens - CIWA every 4 hours - Ativan as needed per CIWA score every 1 hour - Librium 50 mg twice daily.  Plan to titrate after 24 to 48 hours - CK and urinalysis pending - Magnesium and phosphorus pending  # Alcohol Use Disorder, Severe 25-year history of alcohol use disorder with 1 prior detox.  Patient is currently interested in rehabilitation resources.  - TOC consult for resources - Daily folic acid and thiamine  # Hepatitis  Likely alcoholic hepatitis.  Will obtain PT-INR to calculate Maddrey score, however suspect this is mild in nature.  No indication currently for glucocorticoids.  - PT/INR pending - Repeat CMP in the morning  # Tachycardia Telemetry consistent with sinus tachycardia. Will obtain EKG to confirm. In the setting of alcohol withdrawal. Patient is likely dehydrated as well.   - Obtain EKG - LR at 100 cc/h for 12 hours  # Hx of Hypertension  Patient states he was previously diagnosed with hypertension but is not on any medications currently.  We will hold off on starting any at this time given acute illness.   Diet: Heart Healthy VTE: Enoxaparin IVF: LR,100cc/hr Code:  Full  Prior to Admission Living Arrangement: Home, living with partner Anticipated Discharge Location:  TBD Barriers to Discharge: Continued medical evaluation and stabilization.   Dispo: Admit patient to Observation with expected length of stay less than 2 midnights.  Signed: Dr. Verdene Lennert Internal Medicine PGY-3 Pager: [phone] After 5pm on weekdays and 1pm on weekends: On Call pager [phone]  06/22/2021, 1:53 PM

## 2021-06-22 NOTE — ED Provider Notes (Signed)
[facility] EMERGENCY DEPARTMENT Provider Note   CSN: [id number] Arrival date & time: 06/22/21  6144     History Chief Complaint  Patient presents with   Seizures    Xavier Owens is a 50 y.o. male.  Patient presents with seizure-like activity while on his bed.  This was seen by his wife who describes it as a whole body shaking episode lasted 3 to 5 minutes before resolving.  Patient states he is never had a seizure before.  He has no recollection what happened.  He states he drinks alcohol all the time but does not recall when his last drink was.  He appears somewhat confused.  Denies any complaints of pain.      Past Medical History:  Diagnosis Date   Alcoholic (HCC)    Hypertension     There are no problems to display for this patient.   Past Surgical History:  Procedure Laterality Date   dislocated hip     HAND SURGERY     KNEE SURGERY         Family History  Problem Relation Age of Onset   Cancer Mother    Cancer Father     Social History   Tobacco Use   Smoking status: Every Day    Packs/day: 0.10    Types: Cigarettes   Smokeless tobacco: Never  Vaping Use   Vaping Use: Never used  Substance Use Topics   Alcohol use: Yes   Drug use: No    Home Medications Prior to Admission medications   Medication Sig Start Date End Date Taking? Authorizing Provider  ciprofloxacin (CIPRO) 500 MG tablet Take 1 tablet (500 mg total) by mouth 2 (two) times daily. One po bid x 5 days Patient not taking: Reported on 06/22/2021 04/13/16   Melene Plan, DO  diclofenac (VOLTAREN) 50 MG EC tablet Take 1 tablet (50 mg total) 2 (two) times daily by mouth. Patient not taking: Reported on 06/22/2021 09/20/17   Janne Napoleon, NP  HYDROcodone-acetaminophen (NORCO/VICODIN) 5-325 MG tablet Take 1-2 tablets by mouth every 6 hours as needed for pain. Patient not taking: Reported on 06/22/2021 09/14/16   Pisciotta, Joni Reining, [name]  lidocaine (LIDODERM) 5 % Place 1 patch  onto the skin daily. Remove & Discard patch within 12 hours or as directed by MD Patient not taking: Reported on 06/22/2021 03/21/18   Joy, Hillard Danker, [name]  magnesium 30 MG tablet Take 1 tablet (30 mg total) by mouth 2 (two) times daily. Patient not taking: No sig reported 04/07/16   Jacalyn Lefevre, MD  methocarbamol (ROBAXIN) 500 MG tablet Take 1 tablet (500 mg total) by mouth 2 (two) times daily. Patient not taking: Reported on 06/22/2021 03/21/18   Joy, Ines Bloomer C, [name]  naproxen (NAPROSYN) 500 MG tablet Take 1 tablet (500 mg total) by mouth 3 (three) times daily with meals. Patient not taking: No sig reported 09/23/15   Elpidio Anis, [name]  ondansetron (ZOFRAN ODT) 4 MG disintegrating tablet Take 1 tablet (4 mg total) by mouth every 8 (eight) hours as needed for nausea or vomiting. Patient not taking: Reported on 06/22/2021 03/21/18   Joy, Ines Bloomer C, [name]  pantoprazole (PROTONIX) 20 MG tablet Take 1 tablet (20 mg total) by mouth daily. Patient not taking: Reported on 06/22/2021 05/03/21   Lorre Nick, MD  potassium chloride (K-DUR) 10 MEQ tablet Take 1 tablet (10 mEq total) by mouth daily. Patient not taking: Reported on 06/22/2021 04/07/16   Jacalyn Lefevre, MD  predniSONE (STERAPRED UNI-PAK 21 TAB) 10 MG (21) TBPK tablet Take 6 tabs (60mg ) on day 1, 5 tabs (50mg ) on day 2, 4 tabs (40mg ) on day 3, 3 tabs (30mg ) on day 4, 2 tabs (20mg ) on day 5, and 1 tab (10mg ) on day 6. Patient not taking: Reported on 06/22/2021 03/21/18   C, [name]  sucralfate (CARAFATE) 1 g tablet Take 1 tablet (1 g total) by mouth 4 (four) times daily. Patient not taking: Reported on 06/22/2021 05/03/21   , MD  traMADol (ULTRAM) 50 MG tablet Take 1 tablet (50 mg total) by mouth every 6 (six) hours as needed for severe pain. Patient not taking: No sig reported 09/26/15   05/21/18, [name]    Allergies    Patient has no known allergies.  Review of Systems   Review of Systems  Unable to perform ROS: Mental status change    Physical Exam Updated Vital Signs BP (!) 147/98   Pulse (!) 106   Temp 98.2 F (36.8 C) (Oral)   Resp 12   Ht 5\' 8"  (1.727 m)   Wt 88.5 kg   SpO2 93%   BMI 29.67 kg/m   Physical Exam Constitutional:      Appearance: He is well-developed.  HENT:     Head: Normocephalic.     Nose: Nose normal.  Eyes:     Extraocular Movements: Extraocular movements intact.  Cardiovascular:     Rate and Rhythm: Normal rate.  Pulmonary:     Effort: Pulmonary effort is normal.  Skin:    Coloration: Skin is not jaundiced.  Neurological:     Mental Status: He is alert.     Comments: Patient is awake and alert follows commands and answers questions but appears confused and states "I do not know" to many questions about what might of happened today.    ED Results / Procedures / Treatments   Labs (all labs ordered are listed, but only abnormal results are displayed) Labs Reviewed  COMPREHENSIVE METABOLIC PANEL - Abnormal; Notable for the following components:      Result Value   CO2 19 (*)    Glucose, Bld 176 (*)    BUN 5 (*)    Calcium 8.7 (*)    AST 99 (*)    ALT 46 (*)    All other components within normal limits  CBC WITH DIFFERENTIAL/PLATELET - Abnormal; Notable for the following components:   WBC 11.1 (*)    Neutro Abs 9.9 (*)    All other components within normal limits  SALICYLATE LEVEL - Abnormal; Notable for the following components:   Salicylate Lvl <7.0 (*)    All other components within normal limits  ACETAMINOPHEN LEVEL - Abnormal; Notable for the following components:   Acetaminophen (Tylenol), Serum <10 (*)    All other components within normal limits  RESP PANEL BY RT-PCR (FLU A&B, COVID) ARPGX2  ETHANOL    EKG None  Radiology CT HEAD WO CONTRAST (Harolyn Rutherford)  Result Date: 06/22/2021 CLINICAL DATA:  50 year old male status post witness seizure. Possible alcohol withdrawal. EXAM: CT HEAD WITHOUT CONTRAST TECHNIQUE: Contiguous axial images were obtained from the base  of the skull through the vertex without intravenous contrast. COMPARISON:  Brain MRI 06/10/2015.  Head CT 04/07/2016. FINDINGS: Brain: Cerebral volume is not significantly changed since 2017. No midline shift, ventriculomegaly, mass effect, evidence of mass lesion, intracranial hemorrhage or evidence of cortically based acute infarction. Gray-white matter differentiation is within normal limits throughout  the brain. Vascular: No suspicious intracranial vascular hyperdensity. Skull: Intact, negative. Sinuses/Orbits: Chronic maxillary sinus mucosal thickening is increased on the right since 2017. Other Visualized paranasal sinuses and mastoids are stable and well aerated. Other: Visualized orbits and scalp soft tissues are within normal limits. IMPRESSION: 1. Stable and negative noncontrast CT appearance of the brain. 2. Chronic maxillary sinus disease. Electronically Signed   By: Odessa Fleming M.D.   On: 06/22/2021 10:40    Procedures Procedures   Medications Ordered in ED Medications  LORazepam (ATIVAN) injection 1 mg (has no administration in time range)  sodium chloride 0.9 % bolus 1,000 mL (0 mLs Intravenous Stopped 06/22/21 1204)  LORazepam (ATIVAN) injection 2 mg (2 mg Intravenous Given 06/22/21 1012)    ED Course  I have reviewed the triage vital signs and the nursing notes.  Pertinent labs & imaging results that were available during my care of the patient were reviewed by me and considered in my medical decision making (see chart for details).    MDM Rules/Calculators/A&P                           Labs within normal limits.  Etoh level undetectable.  CT scan of the brain shows no acute findings per radiology.  Patient continues to be tremorous mildly tachycardic.  Given IV Ativan.  I suspect symptoms due to alcohol withdrawal.  Given his seizures continued tremors and acute alcohol withdrawal, will be brought into the medical team.   Final Clinical Impression(s) / ED Diagnoses Final  diagnoses:  Seizure (HCC)  Alcohol withdrawal syndrome without complication [facility])    Rx / DC Orders ED Discharge Orders     None        Cheryll Cockayne, MD 06/22/21 1237

## 2021-06-22 NOTE — ED Triage Notes (Signed)
No hx of seizures. Pt was laying on bed. Witnessed seizure by wife. Drinks daily and pt reports no ETOH for a few days. Unable to remember events. Pt was confused, HR 130s and diaphoretic per EMS. 1L NS and 8mg  Zofran IVP given by EMS.

## 2021-06-22 NOTE — ED Notes (Signed)
Pt and Family updated as to patient's status. 

## 2021-06-22 NOTE — ED Notes (Signed)
Pt sleeping in bed, NAD. A/ox4, states he had a seizure s/p ETOH withdrawal. Pt states drinks at least 1/5 hard alcohol per day. CIWA done, see assessment. Pt denies CP, SOB, ABD pain, n/v/d.

## 2021-06-22 NOTE — Discharge Instructions (Addendum)
[facility] [employer]Gerre Scull [phone]  Mr. Siever, you were admitted to the [facility] on 06/22/21 due to a seizure, likely from a decreased threshold related to alcohol use. It is important to make lifestyle changes including reducing alcohol intake, to a maximum of 1-2 drinks a day. Continuing to drink a lot of alcohol can result in similar symptoms. Given your use for several years, it is important that you are supervised in your alcohol reduction journey, and we have scheduled you an appointment with the internal medicine clinic to support your during this process and to also help manage your other health conditions. Also you are provided a medication called naltrexone which will help with alcohol cessation and control the cravings you may get. Please take this medication as prescribed and try to not miss a dose. Also, as we talked about, you should not drive for the next 6 months because of this seizure you had.

## 2021-06-22 NOTE — Social Work (Addendum)
CSW met with Pt at bedside for substance abuse referral. CAGE-AID score 3. Pt endorses consuming alcohol daily for 13 years.  States that he "takes a day off" once in awhile and that when he did so yesterday he had a seizure.  Pt reports that he would like to seek detox but that he does not wish for treatment to interfere with his work. CSW counseled Pt on the importance of his health. Pt states that he is willing to attend 12-step meetings. CSW and Pt discussed [facility] services available to [facility] residents and added [facility] to AVS for follow up. CSW also added [facility] to AVS and provided Pt with paper resources for 12-step recovery.

## 2021-06-23 ENCOUNTER — Observation Stay (HOSPITAL_COMMUNITY): Payer: Self-pay

## 2021-06-23 DIAGNOSIS — F1020 Alcohol dependence, uncomplicated: Secondary | ICD-10-CM | POA: Diagnosis present

## 2021-06-23 DIAGNOSIS — R569 Unspecified convulsions: Secondary | ICD-10-CM

## 2021-06-23 LAB — HIV ANTIBODY (ROUTINE TESTING W REFLEX): HIV Screen 4th Generation wRfx: NONREACTIVE

## 2021-06-23 LAB — CBC
HCT: 40 % (ref 39.0–52.0)
Hemoglobin: 13.2 g/dL (ref 13.0–17.0)
MCH: 31.5 pg (ref 26.0–34.0)
MCHC: 33 g/dL (ref 30.0–36.0)
MCV: 95.5 fL (ref 80.0–100.0)
Platelets: 246 10*3/uL (ref 150–400)
RBC: 4.19 MIL/uL — ABNORMAL LOW (ref 4.22–5.81)
RDW: 13.1 % (ref 11.5–15.5)
WBC: 8.5 10*3/uL (ref 4.0–10.5)
nRBC: 0 % (ref 0.0–0.2)

## 2021-06-23 LAB — COMPREHENSIVE METABOLIC PANEL
ALT: 35 U/L (ref 0–44)
AST: 74 U/L — ABNORMAL HIGH (ref 15–41)
Albumin: 3.4 g/dL — ABNORMAL LOW (ref 3.5–5.0)
Alkaline Phosphatase: 101 U/L (ref 38–126)
Anion gap: 12 (ref 5–15)
BUN: 5 mg/dL — ABNORMAL LOW (ref 6–20)
CO2: 23 mmol/L (ref 22–32)
Calcium: 8.7 mg/dL — ABNORMAL LOW (ref 8.9–10.3)
Chloride: 98 mmol/L (ref 98–111)
Creatinine, Ser: 0.84 mg/dL (ref 0.61–1.24)
GFR, Estimated: >60 mL/min (ref >60)
Glucose, Bld: 76 mg/dL (ref 70–99)
Potassium: 3.3 mmol/L — ABNORMAL LOW (ref 3.5–5.1)
Sodium: 133 mmol/L — ABNORMAL LOW (ref 135–145)
Total Bilirubin: 1 mg/dL (ref 0.3–1.2)
Total Protein: 7.3 g/dL (ref 6.5–8.1)

## 2021-06-23 LAB — CK: Total CK: 2269 U/L — ABNORMAL HIGH (ref 49–397)

## 2021-06-23 LAB — PHOSPHORUS: Phosphorus: 2.7 mg/dL (ref 2.5–4.6)

## 2021-06-23 LAB — MAGNESIUM: Magnesium: 1.5 mg/dL — ABNORMAL LOW (ref 1.7–2.4)

## 2021-06-23 LAB — PROTIME-INR
INR: 1.1 (ref 0.8–1.2)
Prothrombin Time: 14.1 seconds (ref 11.4–15.2)

## 2021-06-23 MED ORDER — CHLORDIAZEPOXIDE HCL 25 MG PO CAPS
25.0000 mg | ORAL_CAPSULE | Freq: Two times a day (BID) | ORAL | Status: DC
  Administered 2021-06-23: 25 mg via ORAL
  Filled 2021-06-23: qty 1

## 2021-06-23 MED ORDER — POTASSIUM CHLORIDE CRYS ER 20 MEQ PO TBCR: 20.0000 meq | EXTENDED_RELEASE_TABLET | Freq: Three times a day (TID) | ORAL | Status: DC

## 2021-06-23 MED ORDER — NALTREXONE HCL 50 MG PO TABS: 50.0000 mg | ORAL_TABLET | Freq: Every day | ORAL | 0 refills | Status: DC

## 2021-06-23 MED ORDER — POTASSIUM CHLORIDE CRYS ER 20 MEQ PO TBCR
40.0000 meq | EXTENDED_RELEASE_TABLET | Freq: Two times a day (BID) | ORAL | Status: DC
  Administered 2021-06-23: 40 meq via ORAL
  Filled 2021-06-23: qty 2

## 2021-06-23 MED ORDER — LORAZEPAM 2 MG/ML IJ SOLN: 1.0000 mg | Freq: Once | INTRAMUSCULAR | Status: DC | PRN

## 2021-06-23 MED ORDER — MAGNESIUM SULFATE 2 GM/50ML IV SOLN
2.0000 g | Freq: Once | INTRAVENOUS | Status: AC
  Administered 2021-06-23: 2 g via INTRAVENOUS
  Filled 2021-06-23: qty 50

## 2021-06-23 NOTE — Discharge Summary (Signed)
Name: Xavier Owens MRN: [id number] DOB: 1970/12/12 50 y.o. PCP: Arsenio Loader  Date of Admission: 06/22/2021  9:52 AM Date of Discharge:  06/23/21 Attending Physician: Dr. Oswaldo Done  DISCHARGE DIAGNOSIS:  Primary Problem: Seizures [facility])   Hospital Problems: Principal Problem:   Seizures (HCC) Active Problems:   Elevated liver enzymes   Episodic headache   Essential hypertension   Alcohol use disorder    DISCHARGE MEDICATIONS:   Allergies as of 06/23/2021   No Known Allergies      Medication List     STOP taking these medications    ciprofloxacin 500 MG tablet Commonly known as: Cipro   diclofenac 50 MG EC tablet Commonly known as: VOLTAREN   HYDROcodone-acetaminophen 5-325 MG tablet Commonly known as: NORCO/VICODIN   lidocaine 5 % Commonly known as: Lidoderm   magnesium 30 MG tablet   methocarbamol 500 MG tablet Commonly known as: ROBAXIN   naproxen 500 MG tablet Commonly known as: NAPROSYN   ondansetron 4 MG disintegrating tablet Commonly known as: Zofran ODT   pantoprazole 20 MG tablet Commonly known as: PROTONIX   potassium chloride 10 MEQ tablet Commonly known as: KLOR-CON   predniSONE 10 MG (21) Tbpk tablet Commonly known as: STERAPRED UNI-PAK 21 TAB   sucralfate 1 g tablet Commonly known as: Carafate   traMADol 50 MG tablet Commonly known as: ULTRAM       TAKE these medications    naltrexone 50 MG tablet Commonly known as: DEPADE Take 1 tablet (50 mg total) by mouth daily.        DISPOSITION AND FOLLOW-UP:  Xavier Owens was discharged from [facility] in Stable condition. At the hospital follow up visit please address:  Follow-up Recommendations: Consults: none Labs: Urinalysis Studies: none Medications: Naltrexone   Follow-up Appointments:  Follow-up Information     [facility]. Go to.   Why: Please go to your appointment at the internal medicine clinic at  the [facility] (located in the basement) on 06/30/21 at 1:15 with Dr Cyndie Chime for your post hospital visit. Contact information: [address] [place] [address] [phone]        Services, [facility]. Schedule an appointment as soon as possible for a visit.   Contact information: [address] [place] [address] [phone]         Services, [facility]. Schedule an appointment as soon as possible for a visit.   Specialty: [facility] information: [address] [place] [address] [phone]                 HOSPITAL COURSE:  Patient Summary: Alcohol Withdrawal Seizure  Likely etiology of seizure is secondary to alcohol withdrawal given that patient went a prolonged period of time without use.  His partner notes that he generally wakes up every couple hours to drink and did not do so last night.  Ethanol level negative on admission.  Low suspicion for infectious etiology at this time. Patient is experiencing muscle tenderness at this time, so we will evaluate for rhabdomyolysis, however there is no evidence of renal dysfunction. CK level ~2000. Admitted to progressive floor given high risk of delirium tremens. Placed on CIWA every 4 hours, with Ativan as needed per CIWA score every 1 hour. Started Librium 50 mg twice daily, and titrated to 25 mg bid on day 2. Mag 1.5, and repleted. Phosphorus 2.7. Highest CIWA score was 15,  but most recent prior to discharge were 1 and 1. He only required 3 mg of ativan overnight during this only night admitted. CT and MRI were negative, with no recent infarction, hemophage, or mass. Given first seizure, no antiepileptic indicated. Seizure is likely from reduced threshold due to chronic alcohol use for 25 years. Patient is counseled on alcohol cessation, he is offered naltrexone to which he is eager to start, and he is educated on not driving for 6 months post seizure.      Alcohol Use Disorder, Severe 25-year history of alcohol use disorder with 1 prior detox.  Patient is currently interested in rehabilitation resources. TOC consult for resources, and started daily folic acid and thiamine.   Hepatitis Chronically elevated AST/ALT, give ALT 2x AST. Likely alcoholic hepatitis. PT-INR wnl. No indication for glucocorticoids.   Tachycardia Telemetry and EKG consistent with sinus tachycardia in the setting of alcohol withdrawal. Patient is likely dehydrated as well. Received LR at 100 cc/h for 12 hours.   Hx of Hypertension  Patient states he was previously diagnosed with hypertension but is not on any medications currently.  We held off on starting any at this time given acute illness.       DISCHARGE INSTRUCTIONS:   Discharge Instructions     Call MD for:  difficulty breathing, headache or visual disturbances   Complete by: As directed    Call MD for:  persistant dizziness or light-headedness   Complete by: As directed    Call MD for:  persistant nausea and vomiting   Complete by: As directed    Call MD for:  redness, tenderness, or signs of infection (pain, swelling, redness, odor or green/yellow discharge around incision site)   Complete by: As directed    Call MD for:  severe uncontrolled pain   Complete by: As directed    Call MD for:  temperature >100.4   Complete by: As directed    Diet - low sodium heart healthy   Complete by: As directed    Increase activity slowly   Complete by: As directed        SUBJECTIVE:  No acute overnight events. Patient was seen at bedside during rounds this morning. Pt reports feeling well this morning. Pt has no complaints. Pt denies tremor, anxiety, hallucinations. No other concerns at this time. He is looking forward to trying naltrexone for alcohol cessation, and understands that he cannot drive for the next 6 months.   Discharge Vitals:   BP 139/89 (BP Location: Right Arm)   Pulse 98   Temp 98.7 F  (37.1 C) (Oral)   Resp 14   Ht 5\' 8"  (1.727 m)   Wt 88.5 kg   SpO2 96%   BMI 29.67 kg/m   OBJECTIVE:  Constitutional: alert, well-appearing, in no acute distress HENT: normocephalic, atraumatic, mucous membranes moist, abrasion in tongue on lateral aspect.  Neck: supple Cardiovascular: regular rate and rhythm, no m/r/g Pulmonary/Chest: normal work of breathing on room air, lungs clear to auscultation bilaterally Abdominal: soft, non-tender to palpation, non-distended MSK: normal bulk and tone Neurological: alert & oriented x 3, no tremors, no asterixis  Psych: normal behavior, normal affect  Pertinent Labs, Studies, and Procedures:  CBC Latest Ref Rng & Units 06/23/2021 06/22/2021 05/03/2021  WBC 4.0 - 10.5 K/uL 8.5 11.1(H) 10.9(H)  Hemoglobin 13.0 - 17.0 g/dL 05/05/2021 10.1 75.1  Hematocrit 39.0 - 52.0 % 40.0 41.7 42.7  Platelets 150 - 400 K/uL 246 289 295  CMP Latest Ref Rng & Units 06/23/2021 06/22/2021 05/03/2021  Glucose 70 - 99 mg/dL 76 834(H) 962(I)  BUN 6 - 20 mg/dL 5(L) 5(L) 5(L)  Creatinine 0.61 - 1.24 mg/dL 2.97 9.89 2.11  Sodium 135 - 145 mmol/L 133(L) 135 140  Potassium 3.5 - 5.1 mmol/L 3.3(L) 4.9 3.7  Chloride 98 - 111 mmol/L 98 101 100  CO2 22 - 32 mmol/L 23 19(L) 28  Calcium 8.9 - 10.3 mg/dL 9.4(R) 7.4(Y) 9.0  Total Protein 6.5 - 8.1 g/dL 7.3 7.7 -  Total Bilirubin 0.3 - 1.2 mg/dL 1.0 1.0 -  Alkaline Phos 38 - 126 U/L 101 109 -  AST 15 - 41 U/L 74(H) 99(H) -  ALT 0 - 44 U/L 35 46(H) -    CT HEAD WO CONTRAST ( )  Result Date: 06/22/2021 CLINICAL DATA:  50 year old male status post witness seizure. Possible alcohol withdrawal. EXAM: CT HEAD WITHOUT CONTRAST TECHNIQUE: Contiguous axial images were obtained from the base of the skull through the vertex without intravenous contrast. COMPARISON:  Brain MRI 06/10/2015.  Head CT 04/07/2016. FINDINGS: Brain: Cerebral volume is not significantly changed since 2017. No midline shift, ventriculomegaly, mass effect, evidence of  mass lesion, intracranial hemorrhage or evidence of cortically based acute infarction. Gray-white matter differentiation is within normal limits throughout the brain. Vascular: No suspicious intracranial vascular hyperdensity. Skull: Intact, negative. Sinuses/Orbits: Chronic maxillary sinus mucosal thickening is increased on the right since 2017. Other Visualized paranasal sinuses and mastoids are stable and well aerated. Other: Visualized orbits and scalp soft tissues are within normal limits. IMPRESSION: 1. Stable and negative noncontrast CT appearance of the brain. 2. Chronic maxillary sinus disease. Electronically Signed   By: Odessa Fleming M.D.   On: 06/22/2021 10:40   MR BRAIN WO CONTRAST  Result Date: 06/23/2021 CLINICAL DATA:  Seizure, nontraumatic; alcohol use with possible seizure EXAM: MRI HEAD WITHOUT CONTRAST TECHNIQUE: Multiplanar, multiecho pulse sequences of the brain and surrounding structures were obtained without intravenous contrast. COMPARISON:  July 2016 FINDINGS: Motion artifact is present. Brain: There is no acute infarction or intracranial hemorrhage. There is no intracranial mass, mass effect, or edema. There is no hydrocephalus or extra-axial fluid collection. Ventricles and sulci are similar in size and configuration. Vascular: Major vessel flow voids at the skull base are preserved. Skull and upper cervical spine: Normal marrow signal is preserved. Sinuses/Orbits: Circumferential maxillary sinus mucosal thickening. Orbits are unremarkable. Other: Sella is unremarkable.  Mastoid air cells are clear. IMPRESSION: Motion degraded.  No recent infarction, hemorrhage, or mass. Electronically Signed   By: Guadlupe Spanish M.D.   On: 06/23/2021 11:16     Signed: Carmel Sacramento, MD Internal Medicine Resident, PGY-1 [facility]  Pager: [phone] 2:14 PM, 06/23/2021

## 2021-06-23 NOTE — Plan of Care (Signed)

## 2021-06-23 NOTE — Hospital Course (Addendum)
Seizure  Likely etiology of seizure is secondary to alcohol withdrawal given that patient went a prolonged period of time without use.  His partner notes that he generally wakes up every couple hours to drink and did not do so last night.  Ethanol level negative on admission.  Low suspicion for infectious etiology at this time. Patient is experiencing muscle tenderness at this time, so we will evaluate for rhabdomyolysis, however there is no evidence of renal dysfunction. CK level ~2000. Admitted to progressive floor given high risk of delirium tremens. Placed on CIWA every 4 hours, with Ativan as needed per CIWA score every 1 hour. Started Librium 50 mg twice daily, and titrated to 25 mg bid on day 2. Mag 1.5, and repleted. Phosphorus 2.7. Highest CIWA score was 15, but most recent prior to discharge were 1 and 1. He only required 3 mg of ativan overnight during this only night admitted. CT and MRI were negative, with no recent infarction, hemophage, or mass. Given first seizure, no antiepileptic indicated. Seizure is likely from reduced threshold due to chronic alcohol use for 25 years. Patient is counseled on alcohol cessation, he is offered naltrexone to which he is eager to start, and he is educated on not driving for 6 months post seizure.     Alcohol Use Disorder, Severe 25-year history of alcohol use disorder with 1 prior detox.  Patient is currently interested in rehabilitation resources. TOC consult for resources, and started daily folic acid and thiamine.   Hepatitis Chronically elevated AST/ALT, give ALT 2x AST. Likely alcoholic hepatitis. PT-INR wnl. No indication for glucocorticoids.   Tachycardia Telemetry and EKG consistent with sinus tachycardia in the setting of alcohol withdrawal. Patient is likely dehydrated as well. Received LR at 100 cc/h for 12 hours.   Hx of Hypertension  Patient states he was previously diagnosed with hypertension but is not on any medications currently.  We  held off on starting any at this time given acute illness.

## 2021-06-23 NOTE — Progress Notes (Signed)
Discharge instructions reviewed with pt. Copy of instructions given to pt, pt aware of script sent to his pharmacy and states he will pick up.  Pt d/c'd via wheelchair with belongings, with his brother waiting at the ER entrance pt states.            Escorted by unit NT.

## 2021-06-23 NOTE — TOC Transition Note (Signed)
Transition of Care [facility]) - CM/SW Discharge Note   Patient Details  Name: [name] MRN: [id number] Date of Birth: 04-25-1971  Transition of Care [facility]) CM/SW Contact:  Kermit Balo, RN Phone Number: 06/23/2021, 2:13 PM   Clinical Narrative:    Patient is discharging home with self care. Resources for alcholol rehab on the AVS.  Pt provided Good Rx card for his prescription sent to [facility].  Pt has transportation home.   Final next level of care: Home/Self Care Barriers to Discharge: Barriers Unresolved (comment)   Patient Goals and CMS Choice        Discharge Placement                       Discharge Plan and Services                                     Social Determinants of Health (SDOH) Interventions     Readmission Risk Interventions No flowsheet data found.

## 2021-06-30 ENCOUNTER — Other Ambulatory Visit: Payer: Self-pay

## 2021-06-30 ENCOUNTER — Ambulatory Visit (INDEPENDENT_AMBULATORY_CARE_PROVIDER_SITE_OTHER): Payer: Self-pay | Admitting: Student

## 2021-06-30 ENCOUNTER — Encounter: Payer: Self-pay | Admitting: Student

## 2021-06-30 VITALS — BP 120/78 | HR 86 | Temp 98.2°F | Ht 68.0 in | Wt 193.5 lb

## 2021-06-30 DIAGNOSIS — R748 Abnormal levels of other serum enzymes: Secondary | ICD-10-CM

## 2021-06-30 DIAGNOSIS — I10 Essential (primary) hypertension: Secondary | ICD-10-CM

## 2021-06-30 DIAGNOSIS — F1020 Alcohol dependence, uncomplicated: Secondary | ICD-10-CM

## 2021-06-30 MED ORDER — NALTREXONE HCL 50 MG PO TABS: 100.0000 mg | ORAL_TABLET | Freq: Every day | ORAL | 2 refills | Status: AC

## 2021-06-30 NOTE — Assessment & Plan Note (Addendum)
Blood pressure within good range  -No medication indicated -When he obtains work [employer] education [employer], will check lipid panel and A1c to evaluate for cardiovascular risk

## 2021-06-30 NOTE — Progress Notes (Signed)
  CC: Hospital follow-up  HPI:  [name] is a 50 y.o. with past medical history of alcohol use disorder, HTN who presented to the clinic today for recent hospital follow-up.  Please see problem based charting for details  Past Medical History:  Diagnosis Date   Alcoholic (HCC)    Hypertension    Review of Systems:    Per HPI  Physical Exam:  Vitals:   06/30/21 1338  BP: 120/78  Pulse: 86  Temp: 98.2 F (36.8 C)  TempSrc: Oral  SpO2: 98%  Weight: 193 lb 8 oz (87.8 kg)  Height: 5\' 8"  (1.727 m)   Physical Exam Constitutional:      General: He is not in acute distress.    Appearance: He is not toxic-appearing.  HENT:     Head: Normocephalic.  Eyes:     Conjunctiva/sclera: Conjunctivae normal.     Pupils: Pupils are equal, round, and reactive to light.  Cardiovascular:     Rate and Rhythm: Normal rate and regular rhythm.     Heart sounds: Normal heart sounds.  Pulmonary:     Effort: Pulmonary effort is normal. No respiratory distress.     Breath sounds: Normal breath sounds. No wheezing.  Musculoskeletal:        General: Normal range of motion.     Cervical back: Normal range of motion.  Skin:    General: Skin is warm.     Coloration: Skin is not jaundiced.  Neurological:     Mental Status: He is alert and oriented to person, place, and time.  Psychiatric:        Mood and Affect: Mood normal.        Behavior: Behavior normal.     Assessment & Plan:   See Encounters Tab for problem based charting.  Patient discussed with Dr. 

## 2021-06-30 NOTE — Patient Instructions (Signed)
Mr. Carroll,  It was a pleasure seeing you in the clinic today.  Here is a summary what we talked about:  1.  Alcohol use: Please try to cut back on alcohol assumption slowly to avoid withdrawal symptoms.  I will increase your naltrexone to 100 mg daily, new prescription is sent to your pharmacy.  I will send out a referral to Dr. Monna Fam for behavioral therapy.  2.  High blood pressure: blood pressure looks great today.  No medication needed.  3.  Please try to applying for work insurance.  Please return in 3 months,  Take care  Dr. Cyndie Chime

## 2021-06-30 NOTE — Assessment & Plan Note (Addendum)
Patient is here for recent hospital follow-up.  He had an episode of alcohol withdrawal seizure, witnessed by his wife.  Patient has a 25-year history of alcohol use, drinks a fifth of liquor a day.  CT and MRI came back negative.  CMP showed elevated LFT from alcoholic hepatitis with trending down on the day of discharge.  CK was 2000 is without any renal dysfunction.  Given this is his first seizure episode, no antiepileptic medication was indicated.  Today, patient reports doing well.  Denies any seizure episodes since discharge.  States that he still drinking 1 pint of liquor a day.  Reports taking naltrexone.  States that it helps reduce his craving.    Patient reports history of PTSD.  [place] that he served in [employer].  Said that he started drinking alcohol to cope with his PTSD.  His GAD-7 score was 10.  We had a long discussion on complication of alcohol use disorder including Wernicke Korsakoff.  Patient verbalizes understanding and will try to cut back.  -Ambulatory referral to behavioral therapy for healthy way to cope with PTSD and anxiety. -Increase naltrexone to 100 mg -Patient will get in touch with [facility] for rehab -Advised patient to cut back slowly to avoid withdrawal symptoms.

## 2021-06-30 NOTE — Assessment & Plan Note (Signed)
Likely alcoholic hepatitis.  LFT trending down on the day of discharge.  -We will recheck LFT at next visit after patient obtains insurance

## 2021-07-01 NOTE — Progress Notes (Signed)
Internal Medicine Clinic Attending  Case discussed with Dr. Nguyen  At the time of the visit.  We reviewed the resident's history and exam and pertinent patient test results.  I agree with the assessment, diagnosis, and plan of care documented in the resident's note. 

## 2021-07-01 NOTE — Addendum Note (Signed)
Addended byDoran Stabler on: 07/01/2021 01:11 PM   Modules accepted: Level of Service

## 2021-07-07 ENCOUNTER — Encounter: Payer: Self-pay | Admitting: Student

## 2021-07-21 ENCOUNTER — Encounter (HOSPITAL_COMMUNITY): Payer: Self-pay | Admitting: Emergency Medicine

## 2021-07-21 ENCOUNTER — Other Ambulatory Visit: Payer: Self-pay

## 2021-07-21 ENCOUNTER — Emergency Department (HOSPITAL_COMMUNITY)
Admission: EM | Admit: 2021-07-21 | Discharge: 2021-07-21 | Disposition: A | Discharge status: Home / Self Care | Payer: Self-pay | Attending: Emergency Medicine | Admitting: Emergency Medicine

## 2021-07-21 ENCOUNTER — Emergency Department (HOSPITAL_COMMUNITY): Payer: Self-pay

## 2021-07-21 DIAGNOSIS — X58XXXA Exposure to other specified factors, initial encounter: Secondary | ICD-10-CM | POA: Insufficient documentation

## 2021-07-21 DIAGNOSIS — Z79899 Other long term (current) drug therapy: Secondary | ICD-10-CM | POA: Insufficient documentation

## 2021-07-21 DIAGNOSIS — W19XXXA Unspecified fall, initial encounter: Secondary | ICD-10-CM

## 2021-07-21 DIAGNOSIS — I10 Essential (primary) hypertension: Secondary | ICD-10-CM | POA: Insufficient documentation

## 2021-07-21 DIAGNOSIS — F10930 Alcohol use, unspecified with withdrawal, uncomplicated: Secondary | ICD-10-CM

## 2021-07-21 DIAGNOSIS — F10230 Alcohol dependence with withdrawal, uncomplicated: Secondary | ICD-10-CM | POA: Insufficient documentation

## 2021-07-21 DIAGNOSIS — F17210 Nicotine dependence, cigarettes, uncomplicated: Secondary | ICD-10-CM | POA: Insufficient documentation

## 2021-07-21 DIAGNOSIS — R519 Headache, unspecified: Secondary | ICD-10-CM

## 2021-07-21 DIAGNOSIS — S01512A Laceration without foreign body of oral cavity, initial encounter: Secondary | ICD-10-CM | POA: Insufficient documentation

## 2021-07-21 HISTORY — DX: Unspecified convulsions: R56.9

## 2021-07-21 LAB — RAPID URINE DRUG SCREEN, HOSP PERFORMED
Amphetamines: NOT DETECTED
Barbiturates: NOT DETECTED
Benzodiazepines: POSITIVE — AB
Cocaine: NOT DETECTED
Opiates: NOT DETECTED
Tetrahydrocannabinol: NOT DETECTED

## 2021-07-21 LAB — URINALYSIS, ROUTINE W REFLEX MICROSCOPIC
Bacteria, UA: NONE SEEN
Bilirubin Urine: NEGATIVE
Glucose, UA: NEGATIVE mg/dL
Ketones, ur: 80 mg/dL — AB
Leukocytes,Ua: NEGATIVE
Nitrite: NEGATIVE
Protein, ur: >=300 mg/dL — AB
Specific Gravity, Urine: 1.027 (ref 1.005–1.030)
pH: 5 (ref 5.0–8.0)

## 2021-07-21 LAB — CBC WITH DIFFERENTIAL/PLATELET
Abs Immature Granulocytes: 0.04 10*3/uL (ref 0.00–0.07)
Basophils Absolute: 0 10*3/uL (ref 0.0–0.1)
Basophils Relative: 0 %
Eosinophils Absolute: 0 10*3/uL (ref 0.0–0.5)
Eosinophils Relative: 0 %
HCT: 46.8 % (ref 39.0–52.0)
Hemoglobin: 15.9 g/dL (ref 13.0–17.0)
Immature Granulocytes: 0 %
Lymphocytes Relative: 9 %
Lymphs Abs: 0.9 10*3/uL (ref 0.7–4.0)
MCH: 31.5 pg (ref 26.0–34.0)
MCHC: 34 g/dL (ref 30.0–36.0)
MCV: 92.9 fL (ref 80.0–100.0)
Monocytes Absolute: 0.7 10*3/uL (ref 0.1–1.0)
Monocytes Relative: 7 %
Neutro Abs: 8.3 10*3/uL — ABNORMAL HIGH (ref 1.7–7.7)
Neutrophils Relative %: 84 %
Platelets: 251 10*3/uL (ref 150–400)
RBC: 5.04 MIL/uL (ref 4.22–5.81)
RDW: 13.1 % (ref 11.5–15.5)
WBC: 9.9 10*3/uL (ref 4.0–10.5)
nRBC: 0 % (ref 0.0–0.2)

## 2021-07-21 LAB — BASIC METABOLIC PANEL
Anion gap: 19 — ABNORMAL HIGH (ref 5–15)
BUN: 12 mg/dL (ref 6–20)
CO2: 21 mmol/L — ABNORMAL LOW (ref 22–32)
Calcium: 9.3 mg/dL (ref 8.9–10.3)
Chloride: 92 mmol/L — ABNORMAL LOW (ref 98–111)
Creatinine, Ser: 0.93 mg/dL (ref 0.61–1.24)
GFR, Estimated: >60 mL/min (ref >60)
Glucose, Bld: 100 mg/dL — ABNORMAL HIGH (ref 70–99)
Potassium: 3.4 mmol/L — ABNORMAL LOW (ref 3.5–5.1)
Sodium: 132 mmol/L — ABNORMAL LOW (ref 135–145)

## 2021-07-21 LAB — ETHANOL: Alcohol, Ethyl (B): <10 mg/dL (ref <10)

## 2021-07-21 MED ORDER — CHLORDIAZEPOXIDE HCL 25 MG PO CAPS
50.0000 mg | ORAL_CAPSULE | Freq: Once | ORAL | Status: AC
  Administered 2021-07-21: 50 mg via ORAL
  Filled 2021-07-21: qty 2

## 2021-07-21 MED ORDER — LORAZEPAM 2 MG/ML IJ SOLN
1.0000 mg | Freq: Once | INTRAMUSCULAR | Status: DC
  Filled 2021-07-21: qty 1

## 2021-07-21 MED ORDER — LORAZEPAM 1 MG PO TABS
1.0000 mg | ORAL_TABLET | Freq: Once | ORAL | Status: AC
  Administered 2021-07-21: 1 mg via ORAL
  Filled 2021-07-21: qty 1

## 2021-07-21 MED ORDER — CHLORDIAZEPOXIDE HCL 25 MG PO CAPS: ORAL_CAPSULE | ORAL | 0 refills | Status: DC

## 2021-07-21 NOTE — ED Provider Notes (Signed)
[facility]-EMERGENCY DEPT Provider Note   CSN: [id number] Arrival date & time: 07/21/21  9628     History Chief Complaint  Patient presents with   Headache    Xavier Owens is a 50 y.o. male.  HPI Patient presents with a headache.  He reports he has had it since yesterday.  He is unsure but he suspects he fell out of bed yesterday morning.  His tongue was bitten and his left arm was slightly sore.  His wife thought that he had another seizure.  He was a little confused at that time but then became oriented again.  Patient reports her last alcohol he had was 4 days ago on Sunday.  He reports he used to be drinking 1/5 a day and is cut back to a pint today.  Patient reports he does not feel very shaky or unwell.  He just has a continued generalized headache.  No fever, no vomiting, no visual changes.    Past Medical History:  Diagnosis Date   Alcoholic (HCC)    Hypertension    Seizures (HCC)     Patient Active Problem List   Diagnosis Date Noted   Alcohol use disorder 06/23/2021   Seizures (HCC) 06/22/2021   Elevated liver enzymes 04/19/2016   Essential hypertension 04/19/2016    Past Surgical History:  Procedure Laterality Date   dislocated hip     HAND SURGERY     KNEE SURGERY         Family History  Problem Relation Age of Onset   Cancer Mother    Cancer Father     Social History   Tobacco Use   Smoking status: Every Day    Packs/day: 0.10    Types: Cigarettes   Smokeless tobacco: Never  Vaping Use   Vaping Use: Never used  Substance Use Topics   Alcohol use: Yes   Drug use: No    Home Medications Prior to Admission medications   Medication Sig Start Date End Date Taking? Authorizing Provider  chlordiazePOXIDE (LIBRIUM) 25 MG capsule 50mg  PO TID x 1D, then 25-50mg  PO BID X 1D, then 25-50mg  PO QD X 1D 07/21/21  Yes Desiree Fleming, 09/20/21, MD  naltrexone (DEPADE) 50 MG tablet Take 2 tablets (100 mg total) by mouth daily. 06/30/21  09/28/21  09/30/21, DO    Allergies    Patient has no known allergies.  Review of Systems   Review of Systems 10 systems reviewed and negative except as per HPI Physical Exam Updated Vital Signs BP (!) 132/92   Pulse 93   Temp 99 F (37.2 C) (Oral)   Resp 15   SpO2 97%   Physical Exam Constitutional:      Appearance: Normal appearance.  HENT:     Head: Normocephalic and atraumatic.     Mouth/Throat:     Comments: Minor tongue laceration on the left. Eyes:     Extraocular Movements: Extraocular movements intact.     Pupils: Pupils are equal, round, and reactive to light.  Cardiovascular:     Rate and Rhythm: Normal rate and regular rhythm.  Pulmonary:     Effort: Pulmonary effort is normal.     Breath sounds: Normal breath sounds.  Abdominal:     General: There is no distension.     Palpations: Abdomen is soft.     Tenderness: There is no abdominal tenderness. There is no guarding.  Musculoskeletal:        General: No swelling or  tenderness. Normal range of motion.     Cervical back: Neck supple.     Right lower leg: No edema.     Left lower leg: No edema.  Skin:    General: Skin is warm and dry.  Neurological:     General: No focal deficit present.     Mental Status: He is alert and oriented to person, place, and time.     Cranial Nerves: No cranial nerve deficit.     Coordination: Coordination normal.     Comments: Patient.  Just have a very mild tremor of the hands.  No confusion.  Movements coordinated purposeful and symmetric.  Psychiatric:        Mood and Affect: Mood normal.    ED Results / Procedures / Treatments   Labs (all labs ordered are listed, but only abnormal results are displayed) Labs Reviewed  BASIC METABOLIC PANEL - Abnormal; Notable for the following components:      Result Value   Sodium 132 (*)    Potassium 3.4 (*)    Chloride 92 (*)    CO2 21 (*)    Glucose, Bld 100 (*)    Anion gap 19 (*)    All other components within  normal limits  CBC WITH DIFFERENTIAL/PLATELET - Abnormal; Notable for the following components:   Neutro Abs 8.3 (*)    All other components within normal limits  RAPID URINE DRUG SCREEN, HOSP PERFORMED - Abnormal; Notable for the following components:   Benzodiazepines POSITIVE (*)    All other components within normal limits  ETHANOL  URINALYSIS, ROUTINE W REFLEX MICROSCOPIC    EKG None  Radiology CT Head Wo Contrast  Result Date: 07/21/2021 CLINICAL DATA:  Headache and left arm pain. EXAM: CT HEAD WITHOUT CONTRAST TECHNIQUE: Contiguous axial images were obtained from the base of the skull through the vertex without intravenous contrast. COMPARISON:  06/22/2021 FINDINGS: Brain: There is no evidence for acute hemorrhage, hydrocephalus, mass lesion, or abnormal extra-axial fluid collection. No definite CT evidence for acute infarction. Vascular: No hyperdense vessel or unexpected calcification. Skull: No evidence for fracture. No worrisome lytic or sclerotic lesion. Sinuses/Orbits: Chronic maxillary sinus disease again noted on the right. Visualized portions of the globes and intraorbital fat are unremarkable. Other: None. IMPRESSION: 1. Stable.  No acute intracranial abnormality. 2. Chronic right maxillary sinus disease. Electronically Signed   By: Kennith Center M.D.   On: 07/21/2021 13:24    Procedures Procedures   Medications Ordered in ED Medications  LORazepam (ATIVAN) tablet 1 mg (1 mg Oral Given 07/21/21 1354)  chlordiazePOXIDE (LIBRIUM) capsule 50 mg (50 mg Oral Given 07/21/21 1429)    ED Course  I have reviewed the triage vital signs and the nursing notes.  Pertinent labs & imaging results that were available during my care of the patient were reviewed by me and considered in my medical decision making (see chart for details).    MDM Rules/Calculators/A&P                          Patient presents for some residual headache after he fell out of bed yesterday morning.  He  reports he thinks he had a seizure.  He was a bit confused after the event but that has all since resolved.  He reports he does have a sore left shoulder and it hurts if he moves in certain positions.  He reports he did have a small area where he  had bitten his tongue.  Patient has history of significant alcohol use.  He has been cutting back.  He reports last alcohol use was 3 days ago which would be consistent with withdrawal symptoms at 48 to 72 hours.  Patient's mental status is clear.  No signs of hallucinations.  He is not significantly hypertensive.  He has mild tremor of the hands.  CT head obtained to rule out occult injury such as subdural hematoma.  No findings on CT head.  Otherwise stable diagnostic findings.  At this time plan will be for a Librium taper.  Patient was counseled on the use of a Librium taper and alcohol cessation.  He is following up with his PCP within the next 2 days.  Patient is aware that he cannot drive any vehicles he cannot work in any situations that would result in injury if he had a repeat seizure.  Careful return precautions reviewed.  Final Clinical Impression(s) / ED Diagnoses Final diagnoses:  Fall, initial encounter  Acute nonintractable headache, unspecified headache type  Alcohol withdrawal syndrome without complication (HCC)    Rx / DC Orders ED Discharge Orders          Ordered    chlordiazePOXIDE (LIBRIUM) 25 MG capsule        07/21/21 1429             Arby Barrette, MD 07/21/21 1438

## 2021-07-21 NOTE — ED Triage Notes (Signed)
Per pt, states he thinks he had a seizure yesterday-states he was getting ready for work and then he found himself trying to tie his shoes...states he bit his tongue and noticed a bruise on his left shoulder-states this is the second seizure he has had-states he is not on any meds and has not followed up with a neurologist-states he heard something "pop" in his head-complaining of a headache

## 2021-07-21 NOTE — ED Notes (Signed)
Attempted to obtain IV access. Pt not able to tolerate, asking to not receive any IV therapy. MD notified of need to change IV Ativan to PO

## 2021-07-21 NOTE — Discharge Instructions (Addendum)
1.  By your description, it sounds like you had a seizure yesterday morning that caused you to fall out of bed.  You report your last alcohol use approximately 3 days ago.  This is a typical time to get symptoms of alcohol withdrawal.  At this time you are not showing any signs of severe withdrawal.  You are being given a prescription for Librium.  This is a medication that treats alcohol withdrawal.  Do not resume drinking while you are taking this medication.  Get a treatment program as soon as possible.  See your doctor Friday as planned. 2.  Absolutely no driving or working with any machinery or situations that could result in injury if you had a repeat seizure.  Return to the emergency department immediately if you develop any type of confusion, worsening tremors, hallucinations, another seizure or any other concerning symptoms.

## 2021-07-21 NOTE — ED Provider Notes (Signed)
Emergency Medicine Provider Triage Evaluation Note  Xavier Owens , a 51 y.o. male  was evaluated in triage.  Pt complains of headache, concern for another seizure. 1 prior seizure  1 month ago, bit tongue, has not been to neurology, was started on unknown medication and is not compliant. Yesterday, fell out of bed possibly due to seizure and bruised left shoulder. Walking down the hallway and was confused, "where is my car?" Felt something in his head and everything came back to normal- knew car was in the garage. States since that time he has had a headache. Pain located frontal area, does not radiate, constant, nothing makes pain worse, stayed in bed all day yesterday, has not taken anything for the headache. Bit tongue Monday night. Last had alcohol Sunday. No drugs including marijuana.   Review of Systems  Positive: headache Negative: fever  Physical Exam  BP (!) 146/98 (BP Location: Left Arm)   Pulse (!) 112   Temp 99 F (37.2 C) (Oral)   Resp 16   SpO2 98%  Gen:   Awake, no distress   Resp:  Normal effort  MSK:   Moves extremities without difficulty  Other:  Speech clear  Medical Decision Making  Medically screening exam initiated at 9:04 AM.  Appropriate orders placed.  Xavier Owens was informed that the remainder of the evaluation will be completed by another provider, this initial triage assessment does not replace that evaluation, and the importance of remaining in the ED until their evaluation is complete.     Jeannie Fend, [name] 07/21/21 4034    Mancel Bale, MD 07/21/21 [phone]

## 2021-07-26 ENCOUNTER — Ambulatory Visit: Payer: Self-pay | Admitting: Behavioral Health

## 2021-07-26 DIAGNOSIS — F331 Major depressive disorder, recurrent, moderate: Secondary | ICD-10-CM

## 2021-07-26 DIAGNOSIS — F1020 Alcohol dependence, uncomplicated: Secondary | ICD-10-CM

## 2021-07-26 DIAGNOSIS — F419 Anxiety disorder, unspecified: Secondary | ICD-10-CM

## 2021-07-26 NOTE — BH Specialist Note (Signed)
Integrated Behavioral Health via Telemedicine Visit  07/26/2021 Xavier Owens [id number]  Number of Integrated Behavioral Health visits: 1/6 Session Start time: 10:00am  Session End time: 10:45am Total time: 45   Referring Provider: Dr. Doran Stabler, DO Patient/Family location: Pt is on break @ work in private outside [facility] Provider location: [facility] Office All persons participating in visit: Pt & Clinician Types of Service: Individual psychotherapy  I connected with Clayborn Heron and/or Sherilyn Cooter Shimp's  self  via  Telephone or [employer]  (Video is Surveyor, mining) and verified that I am speaking with the correct person using two identifiers. Discussed confidentiality: Yes   I discussed the limitations of telemedicine and the availability of in person appointments.  Discussed there is a possibility of technology failure and discussed alternative modes of communication if that failure occurs.  I discussed that engaging in this telemedicine visit, they consent to the provision of behavioral healthcare and the services will be billed under their insurance.  Patient and/or legal guardian expressed understanding and consented to Telemedicine visit: Yes   Presenting Concerns: Patient and/or family reports the following symptoms/concerns: Pt is actively trying to reduce his consumption of ETOH. Pt has recent Hx of 2 episodes of Sz activity in the past 2 months. His L shoulder has been bothering him since, but seems to be healing now. Pt has been a heavy drinker for 15+ yrs. Pt will start his day w/alcohol "to cope with society". Pt drinks straight Vodka & chases it w/any type of juice (including pickle juice) or gatorade. Since the Sz activity, Pt has made efforts to "slow down" as his PCP suggested @ [facility]. Pt now consumes one pint/day. Pt drinks during the day @ work on his breaks & has "never had a problem". Pt values his work, the people, & the environment.  On Sunday, Pt was asked to go home & get some rest. This is the first time he has ever had this concern expressed by Co-Workers. He has been upset by this, but knows they care. Pt sleep has improved & he is dreaming again (nice dreams about work) since initiation of his naltrexone script in Aug. Pt lives w/his Wife who has a Dx of Bipolar. He reports, "she triggers me". Duration of problem: 15 yrs; Severity of problem: severe  Patient and/or Family's Strengths/Protective Factors: Social connections, Concrete supports in place (healthy food, safe environments, etc.), and Sense of purpose  Goals Addressed: Patient will:  Reduce symptoms of: anxiety, depression, and efforts to moderate ETOH consumption since his health has been impacted by 2 recent Sz episodes.    Increase knowledge and/or ability of: coping skills, healthy habits, and stress reduction from complications of his marital relationship & a past Hx he needs to process  Demonstrate ability to: Increase healthy adjustment to current life circumstances, Increase adequate support systems for patient/family, and Decrease self-medicating behaviors  Progress towards Goals: Estb'd today: Pt is attempting to reduce ETOH consumption w/Physician assistance using naltrexone. Pt described how he is taking his medication . He is compliant. Pt related how he is feeling improvement in his mental state & hopes to cont to reduce his consumption.   Pt is most worried for the impact of ETOH consumption @ work as he has now been alerted by his "Work Family" to care for his health. Pt does not consume ETOH @ home. He is resting better & now wants psychotherapy to inc his self-awareness of the things he witnessed in his 20's. He  realizes being a witness impacted him on many levels. Agreed to do this work in therapy w/Clinician.   Interventions: Interventions utilized:  Motivational Interviewing, Behavioral Activation, and Substance Use Cslg  w/psychoedu Standardized Assessments completed:  screeners prn  Patient and/or Family Response: Pt receptive to call while on break @ work. Secured Pt privacy. Pt wishes to cont Cslg for his alcohol dependence.  Assessment: Patient currently experiencing improved sense of getting free from his ETOH habit. Pt acknowledged psychoedu re: Use & Abuse, facts about brain health & Sz activity. Emphasized to Pt he is in the initial phase of helping his body adjust & heal as he cuts back carefully on consumption after 15 yrs.  Patient may benefit from cont'd support for his efforts to reduce use/abuse/dependence on alcohol. Pt encouraged today to keep up his efforts & especially reduce use @ work before it becomes an issue w/Employer.  Plan: Follow up with behavioral health clinician on : 2-3 wks for 60 min on telehealth. Provision of addt'l psychoedu & TIC. Behavioral recommendations: Cont efforts & call [facility] for guidance by Physician if things become unmanagable. Referral(s): None @ this time; eventual Referral to [facility] for ETOH use/abuse/dependence.  I discussed the assessment and treatment plan with the patient and/or parent/guardian. They were provided an opportunity to ask questions and all were answered. They agreed with the plan and demonstrated an understanding of the instructions.   They were advised to call back or seek an in-person evaluation if the symptoms worsen or if the condition fails to improve as anticipated.  Deneise Lever, LMFT

## 2021-08-11 ENCOUNTER — Telehealth: Payer: Self-pay | Admitting: Behavioral Health

## 2021-08-11 ENCOUNTER — Ambulatory Visit: Payer: Self-pay | Admitting: Behavioral Health

## 2021-08-11 NOTE — Telephone Encounter (Signed)
Contacted Pt twice for 10:30am IBH telehealth appt. Lft private msg due to VM stating, [employer]-not sure this is a private phone. Asked Pt to r/s @ his convenience.  Dr. Monna Fam

## 2021-09-02 ENCOUNTER — Telehealth: Payer: Self-pay | Admitting: Behavioral Health

## 2021-09-02 ENCOUNTER — Ambulatory Visit: Payer: Self-pay | Admitting: Behavioral Health

## 2021-09-02 NOTE — Telephone Encounter (Signed)
Contacted Pt for IBH telehealth session @ 1:30pm. VM says [employer]-did not leave detailed msg; RC to [facility] to r/s.  Dr. Monna Fam

## 2021-11-16 ENCOUNTER — Emergency Department (HOSPITAL_COMMUNITY): Admission: EM | Admit: 2021-11-16 | Discharge: 2021-11-16 | Discharge status: Against Medical Advice | Payer: Self-pay

## 2021-11-16 NOTE — ED Notes (Signed)
Pt did not answer after 3 calls for triage

## 2021-12-07 ENCOUNTER — Emergency Department (HOSPITAL_COMMUNITY)
Admission: EM | Admit: 2021-12-07 | Discharge: 2021-12-08 | Disposition: A | Discharge status: Other Acute Inpatient Hospital | Payer: Self-pay | Attending: Student | Admitting: Student

## 2021-12-07 ENCOUNTER — Other Ambulatory Visit: Payer: Self-pay

## 2021-12-07 ENCOUNTER — Encounter (HOSPITAL_COMMUNITY): Payer: Self-pay

## 2021-12-07 ENCOUNTER — Ambulatory Visit (HOSPITAL_COMMUNITY)
Admission: RE | Admit: 2021-12-07 | Discharge: 2021-12-07 | Disposition: A | Discharge status: Home / Self Care | Payer: Self-pay | Attending: Psychiatry | Admitting: Psychiatry

## 2021-12-07 DIAGNOSIS — Z20822 Contact with and (suspected) exposure to covid-19: Secondary | ICD-10-CM | POA: Insufficient documentation

## 2021-12-07 DIAGNOSIS — R079 Chest pain, unspecified: Secondary | ICD-10-CM | POA: Insufficient documentation

## 2021-12-07 DIAGNOSIS — F419 Anxiety disorder, unspecified: Secondary | ICD-10-CM

## 2021-12-07 DIAGNOSIS — F1010 Alcohol abuse, uncomplicated: Secondary | ICD-10-CM

## 2021-12-07 DIAGNOSIS — Z79899 Other long term (current) drug therapy: Secondary | ICD-10-CM | POA: Insufficient documentation

## 2021-12-07 DIAGNOSIS — F1024 Alcohol dependence with alcohol-induced mood disorder: Secondary | ICD-10-CM | POA: Insufficient documentation

## 2021-12-07 LAB — CBC WITH DIFFERENTIAL/PLATELET
Abs Immature Granulocytes: 0.02 K/uL (ref 0.00–0.07)
Basophils Absolute: 0.1 K/uL (ref 0.0–0.1)
Basophils Relative: 1 %
Eosinophils Absolute: 0.1 K/uL (ref 0.0–0.5)
Eosinophils Relative: 2 %
HCT: 40.6 % (ref 39.0–52.0)
Hemoglobin: 13.3 g/dL (ref 13.0–17.0)
Immature Granulocytes: 0 %
Lymphocytes Relative: 22 %
Lymphs Abs: 1.4 K/uL (ref 0.7–4.0)
MCH: 31.1 pg (ref 26.0–34.0)
MCHC: 32.8 g/dL (ref 30.0–36.0)
MCV: 94.9 fL (ref 80.0–100.0)
Monocytes Absolute: 0.7 K/uL (ref 0.1–1.0)
Monocytes Relative: 12 %
Neutro Abs: 3.8 K/uL (ref 1.7–7.7)
Neutrophils Relative %: 63 %
Platelets: 193 K/uL (ref 150–400)
RBC: 4.28 MIL/uL (ref 4.22–5.81)
RDW: 13.1 % (ref 11.5–15.5)
WBC: 6.1 K/uL (ref 4.0–10.5)
nRBC: 0 % (ref 0.0–0.2)

## 2021-12-07 LAB — SALICYLATE LEVEL: Salicylate Lvl: <7 mg/dL — ABNORMAL LOW (ref 7.0–30.0)

## 2021-12-07 LAB — COMPREHENSIVE METABOLIC PANEL WITH GFR
ALT: 36 U/L (ref 0–44)
AST: 101 U/L — ABNORMAL HIGH (ref 15–41)
Albumin: 4.2 g/dL (ref 3.5–5.0)
Alkaline Phosphatase: 105 U/L (ref 38–126)
Anion gap: 12 (ref 5–15)
BUN: 8 mg/dL (ref 6–20)
CO2: 22 mmol/L (ref 22–32)
Calcium: 8.9 mg/dL (ref 8.9–10.3)
Chloride: 101 mmol/L (ref 98–111)
Creatinine, Ser: 0.75 mg/dL (ref 0.61–1.24)
GFR, Estimated: >60 mL/min (ref >60)
Glucose, Bld: 90 mg/dL (ref 70–99)
Potassium: 3.1 mmol/L — ABNORMAL LOW (ref 3.5–5.1)
Sodium: 135 mmol/L (ref 135–145)
Total Bilirubin: 0.7 mg/dL (ref 0.3–1.2)
Total Protein: 8.7 g/dL — ABNORMAL HIGH (ref 6.5–8.1)

## 2021-12-07 LAB — RESP PANEL BY RT-PCR (FLU A&B, COVID) ARPGX2
Influenza A by PCR: NEGATIVE
Influenza B by PCR: NEGATIVE
SARS Coronavirus 2 by RT PCR: NEGATIVE

## 2021-12-07 LAB — ETHANOL: Alcohol, Ethyl (B): 79 mg/dL — ABNORMAL HIGH (ref <10)

## 2021-12-07 LAB — ACETAMINOPHEN LEVEL: Acetaminophen (Tylenol), Serum: <10 ug/mL — ABNORMAL LOW (ref 10–30)

## 2021-12-07 MED ORDER — LORAZEPAM 2 MG/ML IJ SOLN: 1.0000 mg | Freq: Once | INTRAMUSCULAR | Status: AC

## 2021-12-07 MED ORDER — THIAMINE HCL 100 MG/ML IJ SOLN: 100.0000 mg | Freq: Every day | INTRAMUSCULAR | Status: DC

## 2021-12-07 MED ORDER — LORAZEPAM 1 MG PO TABS: 0.0000 mg | ORAL_TABLET | Freq: Four times a day (QID) | ORAL | Status: DC

## 2021-12-07 MED ORDER — LORAZEPAM 2 MG/ML IJ SOLN: 0.0000 mg | Freq: Four times a day (QID) | INTRAMUSCULAR | Status: DC

## 2021-12-07 MED ORDER — LORAZEPAM 1 MG PO TABS: 0.0000 mg | ORAL_TABLET | Freq: Two times a day (BID) | ORAL | Status: DC

## 2021-12-07 MED ORDER — LORAZEPAM 2 MG/ML IJ SOLN
INTRAMUSCULAR | Status: AC
  Administered 2021-12-07: 22:00:00 1 mg
  Filled 2021-12-07: qty 1

## 2021-12-07 MED ORDER — LORAZEPAM 1 MG PO TABS
1.0000 mg | ORAL_TABLET | Freq: Once | ORAL | Status: AC
Start: 2021-12-07 — End: 2021-12-07
  Administered 2021-12-07: 21:00:00 1 mg via ORAL
  Filled 2021-12-07: qty 1

## 2021-12-07 MED ORDER — THIAMINE HCL 100 MG PO TABS
100.0000 mg | ORAL_TABLET | Freq: Every day | ORAL | Status: DC
  Administered 2021-12-08: 10:00:00 100 mg via ORAL
  Filled 2021-12-07: qty 1

## 2021-12-07 MED ORDER — LORAZEPAM 2 MG/ML IJ SOLN: 0.0000 mg | Freq: Two times a day (BID) | INTRAMUSCULAR | Status: DC

## 2021-12-07 NOTE — ED Notes (Signed)
Patients wife would like a call back with an update: Mellody Life  [phone]

## 2021-12-07 NOTE — ED Notes (Signed)
Unable to change patient into burgundy scrubs due to being out of stock of scrubs. Only scrubs available are xs. Pt unable to fit this size.

## 2021-12-07 NOTE — ED Notes (Signed)
Pt wanded by security and belongings collected. 1 black backpack behind nurses desk in front of room 16

## 2021-12-07 NOTE — H&P (Signed)
Behavioral Health Medical Screening Exam   Patient was seen, chart reviewed and case discussed with Dr Lucianne Muss. Xavier Owens is a 51 y.o. male who presented to [facility] as a voluntary walk-in for evaluation of worsening depression and anxiety, suicidal ideation in the face of alcohol use disorder. Patients last drink was at 4:30 pm today.Patient is a 51 year-old Native American male who has a 30 year history of daily alcohol use. Patient stated he drinks 1/5 th of Vodka daily. He stated immediately when he wakes up he takes a drink to prevent having dry heaves and a seizure. Patient presents as shaky with sweating. He is alert & oriented x 4. 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 He stated he lives with his wife, Xavier Owens. Patients vital signs are stable with the exception of an elevated heart rate at 113. He has slurred speech, he is shaking and sweating. Patient complains of chest, head and stomach pain of 8/10. He has Patient denies HI/AVH and denies previous suicide attempts. He stated he is suicidal without plan or intent. Patient went to rehab 10 years ago but stated it was not long enough and he never stopped drinking. Patient stated he has anxiety and depression and drinks to take away the anxiety and feelings of hopelessness, guilt and suicidal thoughts. Per chart review, patient has had several emergency room and doctor visits in relation to his alcohol abuse. Given patients history of seizures with withdrawal and presenting symptoms of shakiness, slurred speech and sweating with head, chest and stomach pain,  patient will be transferred via EMS to [facility] for medical clearance. Jerilee Hoh, Consulting civil engineer at [employer] alerted to patients upcoming transfer. Patient would benefit from inpatient psychiatric hospitalization for treatment of alcohol use disorder, anxiety and depression once medically clear.   Patient was transferred to [facility] by EMS after [facility] had been called.   Total Time spent with  patient: 20 minutes  Psychiatric Specialty Exam:  Presentation  General Appearance: Appropriate for Environment; Casual; Fairly Groomed Eye Contact:Good Speech:Clear and Coherent; Normal Rate Speech Volume:Normal Handedness:Right  Mood and Affect  Mood:Anxious; Depressed Affect:Appropriate; Depressed  Thought Process  Thought Processes:Coherent; Goal Directed Descriptions of Associations:Intact  Orientation:Full (Time, Place and Person)  Thought Content:Logical  History of Schizophrenia/Schizoaffective disorder:No data recorded Duration of Psychotic Symptoms:No data recorded Hallucinations:Hallucinations: None  Ideas of Reference:None  Suicidal Thoughts:Suicidal Thoughts: Yes, Active SI Passive Intent and/or Plan: Without Intent; Without Plan  Homicidal Thoughts:Homicidal Thoughts: No  Sensorium  Memory:Immediate Good; Recent Fair; Remote Fair Judgment:Fair Insight:Fair  Executive Functions  Concentration:Good Attention Span:Good Recall:Fair Fund of Knowledge:Good Language:Good  Psychomotor Activity  Psychomotor Activity:Psychomotor Activity: Normal  Assets  Assets:Communication Skills; Desire for Improvement; Housing; Health and safety inspector; Social Support; Transportation  Sleep  Sleep:Sleep: Fair  Physical Exam: Physical Exam Vitals reviewed.  Constitutional:      Appearance: Normal appearance.  HENT:     Head: Normocephalic.     Nose: Nose normal.  Eyes:     Pupils: Pupils are equal, round, and reactive to light.  Pulmonary:     Effort: Pulmonary effort is normal.  Musculoskeletal:        General: Normal range of motion.     Cervical back: Normal range of motion.  Neurological:     General: No focal deficit present.     Mental Status: He is alert and oriented to person, place, and time.  Psychiatric:        Attention and Perception: Attention normal. He  does not perceive auditory or visual hallucinations.        Mood and Affect:  Mood is anxious and depressed.        Speech: Speech normal.        Behavior: Behavior normal. Behavior is cooperative.        Thought Content: Thought content is not paranoid or delusional. Thought content includes suicidal ideation. Thought content does not include homicidal ideation. Thought content does not include homicidal or suicidal plan.        Cognition and Memory: Cognition normal.   Review of Systems  Constitutional: Negative.  Negative for fever.  HENT: Negative.  Negative for congestion and sore throat.   Respiratory: Negative.  Negative for cough and shortness of breath.   Cardiovascular:  Positive for chest pain (related to alcohol use).  Musculoskeletal: Negative.   Neurological:  Positive for seizures (with alcohol use).  Psychiatric/Behavioral:  Positive for depression and substance abuse. The patient is nervous/anxious.    Blood pressure 109/77, pulse (!) 106, temperature 98.5 F (36.9 C), temperature source Oral, resp. rate 18, height 5\' 8"  (1.727 m), weight 88.5 kg, SpO2 95 %. Body mass index is 29.65 kg/m.  Musculoskeletal: Strength & Muscle Tone: within normal limits Gait & Station: normal Patient leans: N/A   Recommendations:  Based on my evaluation the patient does not appear to have an emergency medical condition.Patient transported to [facility] via EMS for medical clearance. Inpatient hospitalization recommended once patient is medically clear.   [facility], NP 12/07/2021, 6:37 PM

## 2021-12-07 NOTE — H&P (Signed)
Behavioral Health Medical Screening Exam   Patient was seen, chart reviewed and case discussed with Dr Lucianne Muss. Xavier Owens is a 51 y.o. male who presented to [facility] as a voluntary walk-in for evaluation of worsening depression and anxiety, suicidal ideation in the face of alcohol use disorder. Patients last drink was at 4:30 pm today.Patient is a 51 year-old Native American male who has a 30 year history of daily alcohol use. Patient stated he drinks 1/5 th of Vodka daily. He stated immediately when he wakes up he takes a drink to prevent having dry heaves and a seizure. Patient presents as shaky with sweating. He is alert & oriented x 4. 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 He stated he lives with his wife, Xavier Owens. Patients vital signs are stable with the exception of an elevated heart rate at 113. Patient denies HI/AVH and denies previous suicide attempts. He stated he is suicidal without plan or intent. Patient went to rehab 10 years ago but stated it was not long enough and he never stopped drinking. Patient stated he has anxiety and depression and drinks to take away the anxiety and feelings of hopelessness, guilt and suicidal thoughts. Per chart review, patient has had several emergency room and doctor visits in relation to his alcohol abuse. Given patients history of seizures with withdrawal and presenting symptoms of shakiness with sweating, patient will be transferred via EMS to [facility] for medical clearance. Jerilee Hoh, Consulting civil engineer at [employer] alerted to patients upcoming transfer. Patient would benefit from inpatient psychiatric hospitalization for treatment of alcohol use disorder, anxiety and depression once medically clear.   Total Time spent with patient: 20 minutes  Psychiatric Specialty Exam:  Presentation  General Appearance: Appropriate for Environment; Casual; Fairly Groomed Eye Contact:Good Speech:Clear and Coherent; Normal Rate Speech  Volume:Normal Handedness:Right  Mood and Affect  Mood:Anxious; Depressed Affect:Appropriate; Depressed  Thought Process  Thought Processes:Coherent; Goal Directed Descriptions of Associations:Intact  Orientation:Full (Time, Place and Person)  Thought Content:Logical  History of Schizophrenia/Schizoaffective disorder:No data recorded Duration of Psychotic Symptoms:No data recorded Hallucinations:Hallucinations: None  Ideas of Reference:None  Suicidal Thoughts:Suicidal Thoughts: Yes, Active SI Passive Intent and/or Plan: Without Intent; Without Plan  Homicidal Thoughts:Homicidal Thoughts: No  Sensorium  Memory:Immediate Good; Recent Fair; Remote Fair Judgment:Fair Insight:Fair  Executive Functions  Concentration:Good Attention Span:Good Recall:Fair Fund of Knowledge:Good Language:Good  Psychomotor Activity  Psychomotor Activity:Psychomotor Activity: Normal  Assets  Assets:Communication Skills; Desire for Improvement; Housing; Health and safety inspector; Social Support; Transportation  Sleep  Sleep:Sleep: Fair  Physical Exam: Physical Exam Vitals reviewed.  Constitutional:      Appearance: Normal appearance.  HENT:     Head: Normocephalic.     Nose: Nose normal.  Eyes:     Pupils: Pupils are equal, round, and reactive to light.  Pulmonary:     Effort: Pulmonary effort is normal.  Musculoskeletal:        General: Normal range of motion.     Cervical back: Normal range of motion.  Neurological:     General: No focal deficit present.     Mental Status: He is alert and oriented to person, place, and time.  Psychiatric:        Attention and Perception: Attention normal. He does not perceive auditory or visual hallucinations.        Mood and Affect: Mood is anxious and depressed.        Speech: Speech normal.        Behavior: Behavior normal. Behavior is  cooperative.        Thought Content: Thought content is not paranoid or delusional. Thought content  includes suicidal ideation. Thought content does not include homicidal ideation. Thought content does not include homicidal or suicidal plan.        Cognition and Memory: Cognition normal.   Review of Systems  Constitutional: Negative.  Negative for fever.  HENT: Negative.  Negative for congestion and sore throat.   Respiratory: Negative.  Negative for cough and shortness of breath.   Cardiovascular:  Positive for chest pain (related to alcohol use).  Musculoskeletal: Negative.   Neurological:  Positive for seizures (with alcohol use).  Psychiatric/Behavioral:  Positive for depression and substance abuse. The patient is nervous/anxious.    Blood pressure (!) 135/91, pulse (!) 113, temperature 98.3 F (36.8 C), temperature source Oral, resp. rate 16, SpO2 99 %. There is no height or weight on file to calculate BMI.  Musculoskeletal: Strength & Muscle Tone: within normal limits Gait & Station: normal Patient leans: N/A   Recommendations:  Based on my evaluation the patient does not appear to have an emergency medical condition.Patient transported to [facility] via EMS for medical clearance. Inpatient hospitalization recommended once patient is medically clear.   Laveda Abbe, NP 12/07/2021, 6:25 PM

## 2021-12-07 NOTE — BH Assessment (Signed)
Clinician informed by the [facility] provider that patient presented to [facility] as a walk-in. However, no TTS involvement due to patient's medical clearance needs. The [facility] provider coordinated patient's transfer to [facility]. Clinician unable to complete patient's TTS at the time of patient's arrival.

## 2021-12-07 NOTE — ED Triage Notes (Signed)
Pt sent from [facility] for medical clearance. Pt presented there for anxiety but drank alcohol tooday, so has to be cleared. VSS

## 2021-12-07 NOTE — BH Assessment (Addendum)
@  0017, Clinician requested patient's NT, Xavier Owens, to set up the TTS machine for patient.  @1824 , Requested patients  nurse , RN) to set up the TTS machine for patients TTS assessment.   @1849 , Charge nurse Toma Copier, RN), informed this Clinician via secure chat that patient is not ready to be seen by TTS. Clinician informed the charge nurse that patient will be seen after shift change, as it's 10 minutes into this Clinicians shift change.

## 2021-12-07 NOTE — BH Assessment (Addendum)
Comprehensive Clinical Assessment (CCA) Note  12/08/2021 Sharon Stapel [id number]  Discharge Disposition: Ethelene Hal, NP, reviewed pt's chart and information and met with pt face-to-face and determined pt will need inpatient criteria once he's medically cleared. Pt's referral information will be faxed out to multiple hospitals, including [facility], for potential placement. This information was relayed to pt's team at 2110.  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 Acute risk factors for suicide include: loss (financial, interpersonal, professional). Protective factors for this patient include: positive social support and hope for the future. Considering these factors, the overall suicide risk at this point appears to be low. Patient is not appropriate for outpatient follow up.  Therefore, a tele-sitter is recommended for suicide precautions.  [place] ED from 12/07/2021 in [place] DEPT ED from 07/21/2021 in [place] DEPT ED to Hosp-Admission (Discharged) from 06/22/2021 in [facility]  C-SSRS RISK CATEGORY Low Risk No Risk No Risk     Chief Complaint:  Chief Complaint  Patient presents with   Medical Clearance   Chest Pain   Alcohol Problem   Visit Diagnosis: F10.24, Alcohol-induced depressive disorder, With severe use disorder  CCA Screening, Triage and Referral (STR) Tavarion Babington is a 51 year old patient who came to the [facility] due to concerns about his alcohol use. Pt states he came to the [facility] today due to, "alcoholism. I need some long-term treatment."   Pt denies current SI, though he states he experienced SI last week. He denies he's ever attempted to kill himself or that he has a plan to kill himself. He denies  prior hospitalizations for mental health concerns. Pt also denies HI, AVH, or NSSIB. Pt acknowledges he owns many guns due to being in [employer], though he states they're in a fire safe. He states he has a court date in February 2023 for a DUI. Pt states he has been drinking 1/5 on a daily basis for 30 years. He states he last drank 1/5 today and that he began drinking when he was 20. Pt has a hx of complications from w/d, including seizures.  Of note, pt acknowledged depression and SI without a plan to the NP who saw him this morning. He was transferred from [facility] to [facility] to presenting as sweating and shakiness due to w/d.  Pt is oriented x5. His recent/remote memory is intact. Pt was cooperative throughout the assessment process. Pt's insight, judgement, and impulse control is impaired at this time.  Patient Reported Information How did you hear about [place]? Self  What Is the Reason for Your Visit/Call Today? Pt states he came to the [facility] today due to, "alcoholism. I need some long-term treatment." Pt denies current SI, though he states he experienced SI last week. He denies he's ever attempted to kill himself or that he has a plan to kill himself. He denies prior hospitalizations for mental health concerns. Pt also denies HI, AVH, or NSSIB. Pt acknowledges he owns many guns due to being in [employer], though he states they're in a fire safe. He states he has a court date in February 2023 for a DUI. Pt states he has been drinking 1/5 on a daily basis for 30 years. He states he last drank 1/5 today and that he began drinking when he was 20. Pt has a hx of complications from  w/d, including seizures.  How Long Has This Been Causing You Problems? > than 6 months  What Do You Feel Would Help You the Most Today? Alcohol or Drug Use Treatment   Have You Recently Had Any Thoughts About Hurting Yourself? No  Are You Planning to Commit Suicide/Harm Yourself At This time? No   Have you Recently Had  Thoughts About [place]? No  Are You Planning to Harm Someone at This Time? No  Explanation: No data recorded  Have You Used Any Alcohol or Drugs in the Past 24 Hours? Yes  How Long Ago Did You Use Drugs or Alcohol? No data recorded What Did You Use and How Much? He states he last drank 1/5 today   Do You Currently Have a Therapist/Psychiatrist? No  Name of Therapist/Psychiatrist: No data recorded  Have You Been Recently Discharged From Any Office Practice or Programs? No  Explanation of Discharge From Practice/Program: No data recorded    CCA Screening Triage Referral Assessment Type of Contact: Tele-Assessment  Telemedicine Service Delivery: Telemedicine service delivery: This service was provided via telemedicine using a 2-way, interactive audio and video technology  Is this Initial or Reassessment? Initial Assessment  Date Telepsych consult ordered in [facility]:  12/07/21  Time Telepsych consult ordered in [facility]:  1857  Location of Assessment: [facility] ED  Provider Location: [facility] Assessment Services   Collateral Involvement: Pt declines to have clinician make contact with his wife, though he states that, if she calls, it is ok to give her updates. Halston Kintz, wife: [phone]   Does Patient Have a Court Appointed Legal Guardian? No data recorded Name and Contact of Legal Guardian: No data recorded If Minor and Not Living with Parent(s), Who has Custody? N/A  Is CPS involved or ever been involved? Never  Is APS involved or ever been involved? Never   Patient Determined To Be At Risk for Harm To Self or Others Based on Review of Patient Reported Information or Presenting Complaint? No  Method: No data recorded Availability of Means: No data recorded Intent: No data recorded Notification Required: No data recorded Additional Information for Danger to Others Potential: No data recorded Additional Comments for Danger to Others Potential: No data  recorded Are There Guns or Other Weapons in Your Home? No data recorded Types of Guns/Weapons: No data recorded Are These Weapons Safely Secured?                            No data recorded Who Could Verify You Are Able To Have These Secured: No data recorded Do You Have any Outstanding Charges, Pending Court Dates, Parole/Probation? No data recorded Contacted To Inform of Risk of Harm To Self or Others: -- (N/A)    Does Patient Present under Involuntary Commitment? No  IVC Papers Initial File Date: No data recorded  [place] of Residence: [place]   Patient Currently Receiving the Following Services: Not Receiving Services   Determination of Need: Emergent (2 hours)   Options For Referral: Inpatient Hospitalization; Medication Management; Outpatient Therapy     CCA Biopsychosocial Patient Reported Schizophrenia/Schizoaffective Diagnosis in Past: No   Strengths: Pt is seeking help for his EtOH abuse. He shares he has a good relationship with his daughter. He denies any conflict with his wife. He maintains employment and housing.   Mental Health Symptoms Depression:   Change in energy/activity; Hopelessness; Irritability   Duration of Depressive symptoms:  Duration of Depressive  Symptoms: Greater than two weeks   Mania:   None   Anxiety:    None   Psychosis:   None   Duration of Psychotic symptoms:    Trauma:   Avoids reminders of event   Obsessions:   None   Compulsions:   None   Inattention:   None   Hyperactivity/Impulsivity:   None   Oppositional/Defiant Behaviors:   None   Emotional Irregularity:   None   Other Mood/Personality Symptoms:   None noted    Mental Status Exam Appearance and self-care  Stature:   Average   Weight:   Average weight   Clothing:   -- (Pt is dressed in hospital scrubs)   Grooming:   Normal   Cosmetic use:   None   Posture/gait:   Normal   Motor activity:   Not Remarkable   Sensorium   Attention:   Normal   Concentration:   Normal   Orientation:   X5   Recall/memory:   Normal   Affect and Mood  Affect:   Anxious; Depressed   Mood:   Depressed; Anxious   Relating  Eye contact:   Normal   Facial expression:   Responsive   Attitude toward examiner:   Cooperative   Thought and Language  Speech flow:  Clear and Coherent   Thought content:   Appropriate to Mood and Circumstances   Preoccupation:   None   Hallucinations:   None   Organization:  No data recorded  [employer] of Knowledge:   Average   Intelligence:   Average   Abstraction:   Normal   Judgement:   Fair   Art therapist:   Realistic   Insight:   Fair   Decision Making:   Impulsive   Social Functioning  Social Maturity:   Impulsive   Social Judgement:   Naive   Stress  Stressors:   Grief/losses; Legal   Coping Ability:   Deficient supports   Skill Deficits:   Decision making; Self-control   Supports:   Family     Religion: Religion/Spirituality Are You A Religious Person?:  (Not assessed) How Might This Affect Treatment?: Not assessed  Leisure/Recreation: Leisure / Recreation Do You Have Hobbies?:  (Not assessed)  Exercise/Diet: Exercise/Diet Do You Exercise?:  (Not assessed) Have You Gained or Lost A Significant Amount of Weight in the Past Six Months?:  (Not assessed) Do You Follow a Special Diet?:  (Not assessed) Do You Have Any Trouble Sleeping?:  (Not assessed)   CCA Employment/Education Employment/Work Situation: Employment / Work Situation Employment Situation: Employed Work Stressors: None noted Patient's Job has Been Impacted by Current Illness: No 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 (Describe in comment) Did You Receive Any Psychiatric Treatment/Services While in the [employer]?:  (Not assessed)  Education: Education Is Patient Currently Attending School?: No Last Grade Completed: 12 Did You  Nutritional therapist?: No Did You Have An Individualized Education Program (IIEP): No Did You Have Any Difficulty At School?: No Patient's Education Has Been Impacted by Current Illness: No   CCA Family/Childhood History Family and Relationship History: Family history Marital status: Married Number of Years Married:  ("A long time.") What types of issues is patient dealing with in the relationship?: Pt denies issues in his marriage Additional relationship information: Pt's chart states he is single and his only contact listed is his father Does patient have children?: Yes How many children?: 1 How is patient's relationship with their children?:  Pt states his daughter is in [place], [place] and that he worries about her and his ability to get to her in an emergency.  Childhood History:  Childhood History By whom was/is the patient raised?:  (Not assessed) Did patient suffer any verbal/emotional/physical/sexual abuse as a child?: No Did patient suffer from severe childhood neglect?: No Has patient ever been sexually abused/assaulted/raped as an adolescent or adult?: No Was the patient ever a victim of a crime or a disaster?: No Witnessed domestic violence?: No Has patient been affected by domestic violence as an adult?: No  Child/Adolescent Assessment:     CCA Substance Use Alcohol/Drug Use: Alcohol / Drug Use Pain Medications: See MAR Prescriptions: See MAr Over the Counter: See MAR History of alcohol / drug use?: Yes Longest period of sobriety (when/how long): Unknown Negative Consequences of Use: Legal Withdrawal Symptoms: Seizures Onset of Seizures: Unknown Date of most recent seizure: Unknown Substance #1 Name of Substance 1: EtOH 1 - Age of First Use: 20 1 - Amount (size/oz): 1/5 of liquor 1 - Frequency: Daily 1 - Duration: 30 years 1 - Last Use / Amount: Today (12/07/2021) 1 - Method of Aquiring: Purchase 1- Route of Use: Oral                       ASAM's:   Six Dimensions of Multidimensional Assessment  Dimension 1:  Acute Intoxication and/or Withdrawal Potential:   Dimension 1:  Description of individual's past and current experiences of substance use and withdrawal: Pt has experienced seizures due to w/d in the past  Dimension 2:  Biomedical Conditions and Complications:   Dimension 2:  Description of patient's biomedical conditions and  complications: Pt denies  Dimension 3:  Emotional, Behavioral, or Cognitive Conditions and Complications:  Dimension 3:  Description of emotional, behavioral, or cognitive conditions and complications: Pt has court for a DUI in February 2023  Dimension 4:  Readiness to Change:  Dimension 4:  Description of Readiness to Change criteria: Pt expresses a desire for long-term treatment  Dimension 5:  Relapse, Continued use, or Continued Problem Potential:  Dimension 5:  Relapse, continued use, or continued problem potential critiera description: Pt has a hx of requesting treatment and leaving shortly after arriving  Dimension 6:  Recovery/Living Environment:  Dimension 6:  Recovery/Iiving environment criteria description: Pt states he lives with his wife in [place] Severity Score: ASAM's Severity Rating Score: 9  ASAM Recommended Level of Treatment: ASAM Recommended Level of Treatment: Level III Residential Treatment   Substance use Disorder (SUD) Substance Use Disorder (SUD)  Checklist Symptoms of Substance Use: Continued use despite persistent or recurrent social, interpersonal problems, caused or exacerbated by use, Evidence of tolerance, Evidence of withdrawal (Comment), Presence of craving or strong urge to use, Persistent desire or unsuccessful efforts to cut down or control use, Substance(s) often taken in larger amounts or over longer times than was intended  Recommendations for Services/Supports/Treatments: Recommendations for Services/Supports/Treatments Recommendations For  Services/Supports/Treatments: Inpatient Hospitalization, Individual Therapy, Medication Management  Discharge Disposition: Ethelene Hal, NP, reviewed pt's chart and information and met with pt face-to-face and determined pt will need inpatient criteria once he's medically cleared. Pt's referral information will be faxed out to multiple hospitals, including [facility], for potential placement. This information was relayed to pt's team at 2110.  DSM5 Diagnoses: Patient Active Problem List   Diagnosis Date Noted   Alcohol use disorder 06/23/2021   Seizures ([place]) 06/22/2021   Elevated liver  enzymes 04/19/2016   Essential hypertension 04/19/2016     Referrals to Alternative Service(s): Referred to Alternative Service(s):   Place:   Date:   Time:    Referred to Alternative Service(s):   Place:   Date:   Time:    Referred to Alternative Service(s):   Place:   Date:   Time:    Referred to Alternative Service(s):   Place:   Date:   Time:     Dannielle Burn, LMFT

## 2021-12-08 ENCOUNTER — Encounter (HOSPITAL_COMMUNITY): Payer: Self-pay | Admitting: Family

## 2021-12-08 ENCOUNTER — Inpatient Hospital Stay (HOSPITAL_COMMUNITY)
Admission: AD | Admit: 2021-12-08 | Discharge: 2021-12-15 | DRG: 885 | Disposition: A | Discharge status: Home / Self Care | Payer: Federal, State, Local not specified - Other | Source: Intra-hospital | Attending: Emergency Medicine | Admitting: Emergency Medicine

## 2021-12-08 DIAGNOSIS — F419 Anxiety disorder, unspecified: Secondary | ICD-10-CM | POA: Diagnosis present

## 2021-12-08 DIAGNOSIS — Z79899 Other long term (current) drug therapy: Secondary | ICD-10-CM | POA: Diagnosis not present

## 2021-12-08 DIAGNOSIS — F1994 Other psychoactive substance use, unspecified with psychoactive substance-induced mood disorder: Secondary | ICD-10-CM | POA: Diagnosis present

## 2021-12-08 DIAGNOSIS — R45851 Suicidal ideations: Secondary | ICD-10-CM | POA: Diagnosis present

## 2021-12-08 DIAGNOSIS — F332 Major depressive disorder, recurrent severe without psychotic features: Secondary | ICD-10-CM | POA: Diagnosis present

## 2021-12-08 DIAGNOSIS — F1020 Alcohol dependence, uncomplicated: Secondary | ICD-10-CM | POA: Diagnosis present

## 2021-12-08 DIAGNOSIS — F1010 Alcohol abuse, uncomplicated: Secondary | ICD-10-CM | POA: Diagnosis present

## 2021-12-08 DIAGNOSIS — G4700 Insomnia, unspecified: Secondary | ICD-10-CM | POA: Diagnosis present

## 2021-12-08 DIAGNOSIS — R569 Unspecified convulsions: Secondary | ICD-10-CM | POA: Diagnosis present

## 2021-12-08 DIAGNOSIS — F17210 Nicotine dependence, cigarettes, uncomplicated: Secondary | ICD-10-CM | POA: Diagnosis present

## 2021-12-08 DIAGNOSIS — Z20822 Contact with and (suspected) exposure to covid-19: Secondary | ICD-10-CM | POA: Diagnosis present

## 2021-12-08 DIAGNOSIS — I10 Essential (primary) hypertension: Secondary | ICD-10-CM | POA: Diagnosis present

## 2021-12-08 LAB — RAPID URINE DRUG SCREEN, HOSP PERFORMED
Amphetamines: NOT DETECTED
Barbiturates: NOT DETECTED
Benzodiazepines: POSITIVE — AB
Cocaine: NOT DETECTED
Opiates: NOT DETECTED
Tetrahydrocannabinol: NOT DETECTED

## 2021-12-08 LAB — MAGNESIUM: Magnesium: 1.6 mg/dL — ABNORMAL LOW (ref 1.7–2.4)

## 2021-12-08 MED ORDER — THIAMINE HCL 100 MG/ML IJ SOLN: 100.0000 mg | Freq: Once | INTRAMUSCULAR | Status: DC

## 2021-12-08 MED ORDER — ADULT MULTIVITAMIN W/MINERALS CH
1.0000 | ORAL_TABLET | Freq: Every day | ORAL | Status: DC
  Administered 2021-12-09 – 2021-12-15 (×7): 1 via ORAL
  Filled 2021-12-08 (×10): qty 1

## 2021-12-08 MED ORDER — ALUM & MAG HYDROXIDE-SIMETH 200-200-20 MG/5ML PO SUSP: 30.0000 mL | ORAL | Status: DC | PRN

## 2021-12-08 MED ORDER — HYDROXYZINE HCL 25 MG PO TABS
25.0000 mg | ORAL_TABLET | Freq: Four times a day (QID) | ORAL | Status: AC | PRN
  Administered 2021-12-08 – 2021-12-10 (×3): 25 mg via ORAL
  Filled 2021-12-08 (×4): qty 1

## 2021-12-08 MED ORDER — MAGNESIUM HYDROXIDE 400 MG/5ML PO SUSP: 30.0000 mL | Freq: Every day | ORAL | Status: DC | PRN

## 2021-12-08 MED ORDER — POTASSIUM CHLORIDE CRYS ER 20 MEQ PO TBCR
40.0000 meq | EXTENDED_RELEASE_TABLET | Freq: Two times a day (BID) | ORAL | Status: DC
  Administered 2021-12-08: 10:00:00 40 meq via ORAL
  Filled 2021-12-08: qty 2

## 2021-12-08 MED ORDER — LOPERAMIDE HCL 2 MG PO CAPS: 2.0000 mg | ORAL_CAPSULE | ORAL | Status: AC | PRN

## 2021-12-08 MED ORDER — THIAMINE HCL 100 MG PO TABS
100.0000 mg | ORAL_TABLET | Freq: Every day | ORAL | Status: DC
  Administered 2021-12-09 – 2021-12-15 (×7): 100 mg via ORAL
  Filled 2021-12-08 (×10): qty 1

## 2021-12-08 MED ORDER — HYDROXYZINE HCL 25 MG PO TABS
25.0000 mg | ORAL_TABLET | Freq: Three times a day (TID) | ORAL | Status: DC | PRN
  Filled 2021-12-08: qty 1

## 2021-12-08 MED ORDER — LORAZEPAM 1 MG PO TABS: 1.0000 mg | ORAL_TABLET | Freq: Four times a day (QID) | ORAL | Status: AC | PRN

## 2021-12-08 MED ORDER — ONDANSETRON 4 MG PO TBDP: 4.0000 mg | ORAL_TABLET | Freq: Four times a day (QID) | ORAL | Status: AC | PRN

## 2021-12-08 MED ORDER — ACETAMINOPHEN 325 MG PO TABS: 650.0000 mg | ORAL_TABLET | Freq: Four times a day (QID) | ORAL | Status: DC | PRN

## 2021-12-08 MED ORDER — TRAZODONE HCL 50 MG PO TABS
50.0000 mg | ORAL_TABLET | Freq: Every evening | ORAL | Status: DC | PRN
  Administered 2021-12-08: 21:00:00 50 mg via ORAL
  Filled 2021-12-08: qty 1

## 2021-12-08 NOTE — BH Assessment (Signed)
[facility] Assessment Progress Note   Per Elta Guadeloupe, NP, this pt requires psychiatric hospitalization at this time.  Danika, RN, [facility] has assigned pt to [facility] Rm 303-1 to the service of Dr Loleta Chance.  [facility] will be ready to receive pt at 12:00.  Pt has signed Voluntary Admission and Consent for Treatment, as well as Consent to Release Information to his wife, and signed forms have been faxed to [facility].  EDP Gloris Manchester, MD and pt's nurse, Duwayne Heck, have been notified, and Duwayne Heck agrees to send original paperwork along with pt via [facility], and to call report to [phone].  Doylene Canning, [place] Behavioral Health Coordinator [phone]

## 2021-12-08 NOTE — ED Notes (Signed)
Called safe transport with no answer. Left a voicemail to call back regarding pt transport.

## 2021-12-08 NOTE — ED Notes (Signed)
[facility] called- aware pt requires a ride to [facility].

## 2021-12-08 NOTE — Tx Team (Signed)
Initial Treatment Plan 12/08/2021 6:01 PM [name] Yim KCL:[id number]    PATIENT STRESSORS: Substance abuse     PATIENT STRENGTHS: Capable of independent living  Music therapist for treatment/growth  Supportive family/friends  Work skills    PATIENT IDENTIFIED PROBLEMS: Alcohol use   Anxiety/panic attacks  Depression                 DISCHARGE CRITERIA:  Motivation to continue treatment in a less acute level of care Withdrawal symptoms are absent or subacute and managed without 24-hour nursing intervention  PRELIMINARY DISCHARGE PLAN: Attend aftercare/continuing care group Attend PHP/IOP Attend 12-step recovery group  PATIENT/FAMILY INVOLVEMENT: This treatment plan has been presented to and reviewed with the patient, Xavier Owens.  The patient has been given the opportunity to ask questions and make suggestions.  Sofie Hartigan, RN 12/08/2021, 6:01 PM

## 2021-12-08 NOTE — ED Notes (Signed)
Call placed to safe transport x3 with no answer.

## 2021-12-08 NOTE — Progress Notes (Signed)
Pt was admitted to [facility] for medical clearance due to alcoholism. Pt denies SI/HI/AVH. Pt BAL was 79 on admission in the ED. Pt last CIWA score was a 10. Pt states that his stressors are his house that needs some fixing and his daughter being in [place]. Pt states that his alcohol issue does not stress him out but that he does want to stop. Pt states that he drinks a fifth of liquor a day and that he has for 30 years. Pt states he had an 8/10 anxiety currently. Pt gets depressed and has panic attacks only when he wakes up. Pt states he goes to bed with thoughts on his mind and wakes up to have them still, which causes the depression and panic attacks. Pt has a hx of seizures. Pt last seizure was last week and his trigger is not getting enough rest and the panic attacks, not from stopping drinking abruptly. Pt states that he has only had them when laying down. Pt states that he gets an average of 1-3 hours of sleep. Pt stated that he would like some medication to help him sleep better. Pt was animated on admission and winked at the Clinical research associate. Pt made jokes and started conversations with others. Pt states he wants to be in an in-house rehab if possible. Pt states that his support is his wife and father.  Pt has a daughter and pt is working as a Market researcher at [employer], in which "the money goes straight to my daughter to help her out." Pt lives in a house that is paid off on 100 or so more acres of land. Consents signed, handbook detailing the patient's rights, responsibilities, and visitor guidelines provided. Skin/belongings search completed and patient oriented to unit. Patient stable at this time. Patient given the opportunity to express concerns and ask questions. Patient given toiletries. Will continue to monitor.    12/08/21 1550  Psych Admission Type (Psych Patients Only)  Admission Status Voluntary  Psychosocial Assessment  Patient Complaints Substance abuse;Anxiety;Appetite  increase;Insomnia;Depression;Panic attack;Restlessness;Worrying;Shakiness  Information systems manager;Anxious  Affect Euphoric  Speech Logical/coherent  Interaction Assertive;Flirtatious  Motor Activity Tremors  Appearance/Hygiene Unremarkable  Behavior Characteristics Cooperative;Appropriate to situation;Calm  Mood Silly;Pleasant;Euphoric  Aggressive Behavior  Effect No apparent injury  Thought Process  Coherency WDL  Content Blaming self  Delusions None reported or observed  Perception WDL  Hallucination None reported or observed  Judgment Impaired  Confusion None  Danger to Self  Current suicidal ideation? Denies  Danger to Others  Danger to Others None reported or observed

## 2021-12-08 NOTE — ED Notes (Signed)
Call placed to safe transport, message left. Awaiting return call.

## 2021-12-08 NOTE — ED Provider Notes (Signed)
[facility] DEPT Provider Note   CSN: [id number] Arrival date & time: 12/07/21  1801     History  Chief Complaint  Patient presents with   Medical Clearance   Chest Pain   Alcohol Problem    Jeffary Cloonan is a 51 y.o. male who presents to the ED from [facility] for medical clearance.  Patient needs to be admitted for alcoholism, however his most recent drink was earlier today.  At time of initial evaluation, patient is relaxed, well-groomed and overall well-appearing.  He admits to drinking about 1/5 of liquor every day.  Denies drug use.  He denies withdrawal symptoms including tremors, seizures, nausea, vomiting, diarrhea.   Chest Pain Associated symptoms: no abdominal pain, no fever, no headache, no shortness of breath and no vomiting   Alcohol Problem Pertinent negatives include no abdominal pain, no headaches and no shortness of breath.      Home Medications Prior to Admission medications   Medication Sig Start Date End Date Taking? Authorizing Provider  chlordiazePOXIDE (LIBRIUM) 25 MG capsule 50mg  PO TID x 1D, then 25-50mg  PO BID X 1D, then 25-50mg  PO QD X 1D Patient not taking: Reported on 12/07/2021 07/21/21   Charlesetta Shanks, MD      Allergies    Patient has no known allergies.    Review of Systems   Review of Systems  Constitutional:  Negative for fever.  HENT: Negative.    Eyes: Negative.   Respiratory:  Negative for shortness of breath.   Gastrointestinal:  Negative for abdominal pain and vomiting.  Endocrine: Negative.   Genitourinary: Negative.   Musculoskeletal: Negative.   Skin:  Negative for rash.  Neurological:  Negative for headaches.  All other systems reviewed and are negative.  Physical Exam Updated Vital Signs BP 110/71 (BP Location: Right Arm)    Pulse 99    Temp 98.5 F (36.9 C) (Oral)    Resp 16    Ht 5\' 8"  (1.727 m)    Wt 88.5 kg    SpO2 96%    BMI 29.65 kg/m  Physical Exam Vitals and nursing note reviewed.   Constitutional:      General: He is not in acute distress.    Appearance: He is not ill-appearing.  HENT:     Head: Atraumatic.  Eyes:     Conjunctiva/sclera: Conjunctivae normal.  Cardiovascular:     Rate and Rhythm: Normal rate and regular rhythm.     Pulses: Normal pulses.     Heart sounds: No murmur heard. Pulmonary:     Effort: Pulmonary effort is normal. No respiratory distress.     Breath sounds: Normal breath sounds.  Abdominal:     General: Abdomen is flat. There is no distension.     Palpations: Abdomen is soft.     Tenderness: There is no abdominal tenderness.  Musculoskeletal:        General: Normal range of motion.     Cervical back: Normal range of motion.  Skin:    General: Skin is warm and dry.     Capillary Refill: Capillary refill takes less than 2 seconds.  Neurological:     General: No focal deficit present.     Mental Status: He is alert.     Comments: Speech is clear, able to follow commands CN III-XII intact Normal strength in upper and lower extremities bilaterally including dorsiflexion and plantar flexion, strong and equal grip strength Sensation normal to light and sharp touch Moves extremities  without ataxia, coordination intact Normal finger to nose and rapid alternating movements No pronator drift  No tremors    Psychiatric:        Mood and Affect: Mood normal.    ED Results / Procedures / Treatments   Labs (all labs ordered are listed, but only abnormal results are displayed) Labs Reviewed  COMPREHENSIVE METABOLIC PANEL - Abnormal; Notable for the following components:      Result Value   Potassium 3.1 (*)    Total Protein 8.7 (*)    AST 101 (*)    All other components within normal limits  ETHANOL - Abnormal; Notable for the following components:   Alcohol, Ethyl (B) 79 (*)    All other components within normal limits  RAPID URINE DRUG SCREEN, HOSP PERFORMED - Abnormal; Notable for the following components:   Benzodiazepines  POSITIVE (*)    All other components within normal limits  ACETAMINOPHEN LEVEL - Abnormal; Notable for the following components:   Acetaminophen (Tylenol), Serum <10 (*)    All other components within normal limits  SALICYLATE LEVEL - Abnormal; Notable for the following components:   Salicylate Lvl [id number] (*)    All other components within normal limits  RESP PANEL BY RT-PCR (FLU A&B, COVID) ARPGX2  CBC WITH DIFFERENTIAL/PLATELET    EKG EKG Interpretation  Date/Time:  Tuesday December 07 2021 19:18:17 EST Ventricular Rate:  98 PR Interval:  166 QRS Duration: 102 QT Interval:  354 QTC Calculation: 451 R Axis:   -14 Text Interpretation: Normal sinus rhythm Inferior infarct , age undetermined Abnormal ECG When compared with ECG of 22-Jun-2021 17:11, No significant change was found Confirmed by Delora Fuel ([id number]) on 12/08/2021 7:38:59 AM  Radiology No results found.  Procedures Procedures    Medications Ordered in ED Medications  LORazepam (ATIVAN) injection 0-4 mg (0 mg Intravenous Not Given 12/08/21 0331)    Or  LORazepam (ATIVAN) tablet 0-4 mg ( Oral See Alternative 12/08/21 0331)  LORazepam (ATIVAN) injection 0-4 mg (has no administration in time range)    Or  LORazepam (ATIVAN) tablet 0-4 mg (has no administration in time range)  thiamine tablet 100 mg (100 mg Oral Patient Refused/Not Given 12/08/21 0221)    Or  thiamine (B-1) injection 100 mg ( Intravenous See Alternative 12/08/21 0221)  LORazepam (ATIVAN) tablet 1 mg (1 mg Oral Given 12/07/21 2126)  LORazepam (ATIVAN) injection 1 mg (1 mg Intravenous Given 12/07/21 2137)    ED Course/ Medical Decision Making/ A&P Clinical Course as of 12/08/21 0843  Tue Dec 07, 2021  2017 Comprehensive metabolic panel(!) CMP unremarkable [EC]  2038 Resp Panel by RT-PCR (Flu A&B, Covid) Nasopharyngeal Swab Respiratory panel negative [EC]  2038 Acetaminophen level(!) Acetaminophen and salicylate negative [EC]  2038 Ethanol(!) Ethanol  elevated at 79 [EC]  2039 CBC with Diff CBC without evidence of infection or anemia [EC]  2145 RN reports that patient appears to be going into withdrawal.  Endorses slurred speech, shaking.  CIWA score of 9.  Improved 1 mg Ativan p.o. additionally will place IV and administer another 1 mg Ativan [EC]    Clinical Course User Index [EC] Tonye Pearson, [name]                           Medical Decision Making Amount and/or Complexity of Data Reviewed Labs: ordered. Decision-making details documented in ED Course.  Risk OTC drugs. Prescription drug management.   History:   Efton  Albanese is a 51 y.o. male who presents to the ED from [facility] for medical clearance.  Patient needs to be admitted for alcoholism, however his most recent drink was earlier today.  At time of initial evaluation, patient is relaxed, well-groomed and overall well-appearing.  He denies withdrawal symptoms including tremors, seizures, nausea, vomiting, diarrhea. External records from outside source obtained and reviewed including outpatient visit with behavioral health This patient presents to the ED for concern of medical clearance, this involves an extensive number of treatment options, and is a complaint that carries with it a high risk of complications and morbidity.   Concern for this patient would be suicidal or homicidal ideations, alcohol withdrawal, seizures  Initial impression:  Patient is well-groomed, well-appearing in no obvious discomfort.  States last drink was earlier today and currently not feeling any withdrawal symptoms.  We will proceed with medical clearance order set.  Lab Tests and EKG:  I Ordered, reviewed, and interpreted labs and EKG.  The pertinent results in my decision-making regarding them are detailed in the ED course and/or initial impression section above.   Medicines ordered and prescription drug management:  I ordered medication including: 2 mg Ativan for withdrawal symptoms   Reevaluation of the patient after these medicines showed that the patient improved I have reviewed the patients home medicines and have made adjustments as needed   Disposition:  After consideration of the diagnostic results, physical exam, history and the patients response to treatment feel that the patent would benefit from admission to behavioral health.   Alcoholism: Patient is cleared from medical standpoint.  Withdrawal symptoms were under control with Ativan.  Windell Hummingbird, LMFT spoke with patient and is faxing out referral information to multiple hospitals for placement.  Patient will stay here on psych hold until a bed becomes available for him.  Care transferred over to default provider until bed is made available.   Final Clinical Impression(s) / ED Diagnoses Final diagnoses:  Alcohol abuse  Anxiety    Rx / DC Orders ED Discharge Orders     None         Tonye Pearson, [name] 12/08/21 0846    Teressa Lower, MD 12/08/21 1528

## 2021-12-09 DIAGNOSIS — F332 Major depressive disorder, recurrent severe without psychotic features: Principal | ICD-10-CM

## 2021-12-09 LAB — COMPREHENSIVE METABOLIC PANEL
ALT: 32 U/L (ref 0–44)
AST: 70 U/L — ABNORMAL HIGH (ref 15–41)
Albumin: 3.9 g/dL (ref 3.5–5.0)
Alkaline Phosphatase: 108 U/L (ref 38–126)
Anion gap: 9 (ref 5–15)
BUN: 8 mg/dL (ref 6–20)
CO2: 21 mmol/L — ABNORMAL LOW (ref 22–32)
Calcium: 9.3 mg/dL (ref 8.9–10.3)
Chloride: 103 mmol/L (ref 98–111)
Creatinine, Ser: 0.74 mg/dL (ref 0.61–1.24)
GFR, Estimated: >60 mL/min (ref >60)
Glucose, Bld: 97 mg/dL (ref 70–99)
Potassium: 3.6 mmol/L (ref 3.5–5.1)
Sodium: 133 mmol/L — ABNORMAL LOW (ref 135–145)
Total Bilirubin: 0.9 mg/dL (ref 0.3–1.2)
Total Protein: 8.4 g/dL — ABNORMAL HIGH (ref 6.5–8.1)

## 2021-12-09 LAB — TSH: TSH: 5.277 u[IU]/mL — ABNORMAL HIGH (ref 0.350–4.500)

## 2021-12-09 LAB — LIPID PANEL
Cholesterol: 179 mg/dL (ref 0–200)
HDL: 44 mg/dL (ref >40)
LDL Cholesterol: 117 mg/dL — ABNORMAL HIGH (ref 0–99)
Total CHOL/HDL Ratio: 4.1 RATIO
Triglycerides: 90 mg/dL (ref <150)
VLDL: 18 mg/dL (ref 0–40)

## 2021-12-09 MED ORDER — MELATONIN 5 MG PO TABS
5.0000 mg | ORAL_TABLET | Freq: Every day | ORAL | Status: DC
  Administered 2021-12-09: 5 mg via ORAL
  Filled 2021-12-09 (×2): qty 1

## 2021-12-09 NOTE — Progress Notes (Addendum)
°  On assessment, pt presents with mild anxiety and depression. Pt reports he will need something for sleep.  Pt is observed in milieu participating in group.  Pt denies SI/HI and verbally contracts for safety.  Pt denies AVH.  Administered PRN Hydroxyzine and Trazodone per [facility] per pt request.  Pt remains safe on unit with Q 15 minute safety checks.    12/08/21 2127  Psych Admission Type (Psych Patients Only)  Admission Status Voluntary  Psychosocial Assessment  Patient Complaints Anxiety;Depression;Insomnia  Eye Contact Fair  Facial Expression Animated;Anxious  Affect Euphoric  Speech Logical/coherent  Interaction Assertive;Flirtatious  Motor Activity Tremors  Appearance/Hygiene Unremarkable  Behavior Characteristics Cooperative;Appropriate to situation;Calm  Mood Pleasant;Anxious  Thought Process  Coherency WDL  Content Blaming self  Delusions None reported or observed  Perception WDL  Hallucination None reported or observed  Judgment Impaired  Confusion None  Danger to Self  Current suicidal ideation? Denies  Danger to Others  Danger to Others None reported or observed

## 2021-12-09 NOTE — Progress Notes (Signed)
Pt denies SI/HI/AVH and verbally agrees to approach staff if these become apparent or before harming themselves/others. Rates depression 0/10. Rates anxiety 0/10. Rates pain 0/10.  RN spoke with girlfriend, whom the pt calls wife, and she stated that the pt will tell us that he has a lot of money, owns 100+ acres, was in [employer], owns a [employer] fast streamer and a mustang, and that all of it is false. The pt lives in a house that is inherited by the gf and lives on one acre of land with 1 acres behind the house that belongs to the gfs aunt. Pt owns no vehicle and has no drivers license. Gf has only witnessed one seizure. Girlfriend was concerned for him because she thinks this is very serious and that she wants him to tell the truth so that he can get the correct/good care that he needs. Pt has been going to groups and interacting with others. Scheduled medications administered to pt, per MD orders. RN provided support and encouragement to pt. Q15 min safety checks implemented and continued. Pt safe on the unit. RN will continue to monitor and intervene as needed.   12/09/21 0752  Psych Admission Type (Psych Patients Only)  Admission Status Voluntary  Psychosocial Assessment  Patient Complaints Insomnia  Eye Contact Fair  Facial Expression Animated;Anxious  Affect Euphoric  Speech Logical/coherent  Interaction Assertive;Superficial  Motor Activity Tremors  Appearance/Hygiene Unremarkable  Behavior Characteristics Cooperative;Appropriate to situation  Mood Pleasant;Euphoric  Aggressive Behavior  Effect No apparent injury  Thought Process  Coherency WDL  Content Confabulation;Blaming self  Delusions None reported or observed  Perception WDL  Hallucination None reported or observed  Judgment Impaired  Confusion None  Danger to Self  Current suicidal ideation? Denies  Danger to Others  Danger to Others None reported or observed

## 2021-12-09 NOTE — BHH Counselor (Signed)
Adult Comprehensive Assessment  Patient ID: Xavier Owens, male   DOB: 1971/03/17, 51 y.o.   MRN: [id number]  Information Source: Information source: Patient  Current Stressors:  Patient states their primary concerns and needs for treatment are:: "I have been an alcoholic for over 30 years and I woke up decided to come here." Patient states their goals for this hospitilization and ongoing recovery are:: "to stop drinking" Educational / Learning stressors: Denies Employment / Job issues: Denies Family Relationships: Denies Surveyor, quantity / Lack of resources (include bankruptcy): Denies Housing / Lack of housing: Denies Physical health (include injuries & life threatening diseases): Pt reports seizures sue to alcohol use. Social relationships: Denies Substance abuse: "Alcohol use" Bereavement / Loss: step mom Dec 2022; Biological mom 2001"  Living/Environment/Situation:  Living Arrangements: Spouse/significant other Who else lives in the home?: wife How long has patient lived in current situation?: "awhile" What is atmosphere in current home: Comfortable  Family History:  Marital status: Married Number of Years Married:  (Pt states he cannot remember) What types of issues is patient dealing with in the relationship?: Denies Are you sexually active?: Yes What is your sexual orientation?: heterosexual Does patient have children?: Yes How many children?: 1 How is patient's relationship with their children?: "great"  Childhood History:  By whom was/is the patient raised?: Mother, Other (Comment), Grandparents (Step Mom and Olene Floss) Additional childhood history information: Pt reports that dad was around but was mostly working and he grew up in [place] Description of patient's relationship with caregiver when they were a child: Pt reports good relationship with all caretakers Patient's description of current relationship with people who raised him/her: Pt reports good relationship  with all caretakers; mom and step-mom are deceased How were you disciplined when you got in trouble as a child/adolescent?: privilges taken and spanking x2 Does patient have siblings?: Yes Number of Siblings: 1 Description of patient's current relationship with siblings: "We are good. Like twins." Did patient suffer any verbal/emotional/physical/sexual abuse as a child?: No Did patient suffer from severe childhood neglect?: No Has patient ever been sexually abused/assaulted/raped as an adolescent or adult?: No Was the patient ever a victim of a crime or a disaster?: No Witnessed domestic violence?: No Has patient been affected by domestic violence as an adult?: No  Education:  Highest grade of school patient has completed: High school Diploma Currently a student?: No Learning disability?: No  Employment/Work Situation:   Employment Situation: Employed Where is Patient Currently Employed?: Education officer, environmental at [employer] has Patient Been Employed?: 1 1/2 years Are You Satisfied With Your Job?: Yes Do You Work More Than One Job?: No Work Stressors: None noted Patient's Job has Been Impacted by Current Illness: No What is the Longest Time Patient has Held a Job?: 10 years 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?: No  Financial Resources:   Financial resources: Income from employment Does patient have a representative payee or guardian?: No  Alcohol/Substance Abuse:   What has been your use of drugs/alcohol within the last 12 months?: Pt reports drinking 1/5th of alcohol daily If attempted suicide, did drugs/alcohol play a role in this?: No Alcohol/Substance Abuse Treatment Hx: Past Tx, Inpatient Has alcohol/substance abuse ever caused legal problems?: No  Social Support System:   Patient's Community Support System: Good Describe Community Support System: family Type of faith/religion: Methodist  Leisure/Recreation:    Do You Have Hobbies?: Yes Leisure and Hobbies: [place],  watch tv, and cook  Strengths/Needs:   What is the patient's perception of their strengths?: cooking and working with his hands  Discharge Plan:   Currently receiving community mental health services: No Patient states concerns and preferences for aftercare planning are: interested in residential substance use treatment Does patient have access to transportation?: Yes Does patient have financial barriers related to discharge medications?: No Will patient be returning to same living situation after discharge?: Yes (Pt will go home if bed-to bed is not avail.)  Summary/Recommendations:  Gianfranco Araki is a 51 year old, African American, male who was admitted due to alcohol use disorder. Pt has a reported hx of depression. Pt noted no recent stressors. Pt currently sees no outpatient providers but is interested in residential substance use treatment. While here, Massai Hankerson can benefit from crisis stabilization, medication management, therapeutic milieu, and referrals for services.      Felizardo Hoffmann. 12/09/2021

## 2021-12-09 NOTE — BHH Group Notes (Signed)
1400 PsychoEducational Group- °Positive Reframing education was discussed during this group. The Dalia Lama poem on ''watch your thoughts they become actions'' was read and patients were given examples of positive reframing. Pt participated and was engaged and appropriate. °

## 2021-12-09 NOTE — Progress Notes (Signed)
The patient rated his day as a 5.5 out of 10. Patient is complaining of a back ache and that he needs to move around more physically. He says that he is feeling better and that is his goal for tomorrow.

## 2021-12-09 NOTE — Group Note (Signed)
Date:  12/09/2021 Time:  9:48 AM  Group Topic/Focus:  Orientation:   The focus of this group is to educate the patient on the purpose and policies of crisis stabilization and provide a format to answer questions about their admission.  The group details unit policies and expectations of patients while admitted.    Participation Level:  Active  Participation Quality:  Appropriate  Affect:  Appropriate  Cognitive:  Appropriate  Insight: Appropriate  Engagement in Group:  Engaged  Modes of Intervention:  Discussion  Additional Comments:  Lacking but was involved some.  [name] 12/09/2021, 9:48 AM
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
## 2021-12-09 NOTE — Plan of Care (Signed)
°  Problem: Health Behavior/Discharge Planning: Goal: Ability to identify changes in lifestyle to reduce recurrence of condition will improve Outcome: Not Progressing Goal: Identification of resources available to assist in meeting health care needs will improve Outcome: Not Progressing   Problem: Physical Regulation: Goal: Complications related to the disease process, condition or treatment will be avoided or minimized Outcome: Not Progressing

## 2021-12-10 ENCOUNTER — Encounter (HOSPITAL_COMMUNITY): Payer: Self-pay

## 2021-12-10 LAB — MAGNESIUM: Magnesium: 1.9 mg/dL (ref 1.7–2.4)

## 2021-12-10 LAB — HEMOGLOBIN A1C
Hgb A1c MFr Bld: 5 % (ref 4.8–5.6)
Mean Plasma Glucose: 97 mg/dL

## 2021-12-10 LAB — TSH: TSH: 6.379 u[IU]/mL — ABNORMAL HIGH (ref 0.350–4.500)

## 2021-12-10 MED ORDER — MIRTAZAPINE 7.5 MG PO TABS
7.5000 mg | ORAL_TABLET | Freq: Every day | ORAL | Status: DC
  Administered 2021-12-10: 7.5 mg via ORAL
  Filled 2021-12-10 (×4): qty 1

## 2021-12-10 NOTE — Group Note (Signed)
Type of Therapy and Topic:  Group Therapy:  Stress Management   Participation Level:  Active    Description of Group:  Patients in this group were introduced to the idea of stress and encouraged to discuss negative and positive ways to manage stress. Patients discussed specific stressors that they have in their life right now and the physical signs and symptoms associated with that stress.  Patient encouraged to come up with positive changes to assist with the stress upon discharge in order to prevent future hospitalizations.   They also worked as a group on developing a specific plan for several patients to deal with stressors through [place], psychoeducation and self care techniques   Therapeutic Goals:               1)  To discuss the positive and negative impacts of stress             2)  identify signs and symptoms of stress             3)  generate ideas for stress management             4)  offer mutual support to others regarding stress management             5)  Developing plans for ways to manage specific stressors upon discharge               Summary of Patient Progress:  Patient participated appropriately in group. He discussed that he has overcome not drinking for 4 days about a 5th of alcohol.  Patient was open to feedback and offered input to other group peers.  Patient participated in [place] of Control activity and was able to identify things to worry about and not worry about.    Therapeutic Modalities:   Motivational Interviewing Brief Solution-Focused Therapy   Chyanna Flock, LCSW, [place] Social Worker  [facility]

## 2021-12-10 NOTE — Group Note (Signed)
Date:  12/10/2021 Time:  9:50 AM  Group Topic/Focus:  Orientation:   The focus of this group is to educate the patient on the purpose and policies of crisis stabilization and provide a format to answer questions about their admission.  The group details unit policies and expectations of patients while admitted.    Participation Level:  Active  Participation Quality:  Appropriate  Affect:  Appropriate  Cognitive:  Appropriate  Insight: Appropriate  Engagement in Group:  Engaged  Modes of Intervention:  Discussion  Additional Comments:  Active participation in group.  [name] 12/10/2021, 9:50 AM

## 2021-12-10 NOTE — Progress Notes (Signed)
Pt denies SI/HI/AVH and verbally agrees to approach staff if these become apparent or before harming themselves/others. Rates depression 0/10. Rates anxiety 0/10. Rates pain 0/10.  Pt is going to groups and is interacting well with others. Scheduled medications administered to pt, per MD orders. RN provided support and encouragement to pt. Q15 min safety checks implemented and continued. Pt safe on the unit. RN will continue to monitor and intervene as needed.   12/10/21 0809  Psych Admission Type (Psych Patients Only)  Admission Status Voluntary  Psychosocial Assessment  Patient Complaints None  Eye Contact Fair  Facial Expression Animated  Affect Euphoric  Speech Logical/coherent  Interaction Assertive  Motor Activity Slow;Fidgety  Appearance/Hygiene Unremarkable  Behavior Characteristics Cooperative;Appropriate to situation;Calm  Mood Pleasant;Euphoric  Aggressive Behavior  Effect No apparent injury  Thought Process  Coherency WDL  Content Confabulation;Blaming self  Delusions None reported or observed  Perception WDL  Hallucination None reported or observed  Judgment Impaired  Confusion None  Danger to Self  Current suicidal ideation? Denies  Danger to Others  Danger to Others None reported or observed

## 2021-12-10 NOTE — Group Note (Signed)
Recreation Therapy Group Note   Group Topic:Stress Management  Group Date: 12/10/2021 Start Time: 0935 End Time: 0955 Facilitators: Victorino Sparrow, LRT,CTRS Location: [address] Dayroom   Goal Area(s) Addresses:  Patient will actively participate in stress management techniques presented during session.  Patient will successfully identify benefit of practicing stress management post d/c.   Group Description: Guided Imagery. LRT provided education, instruction, and demonstration on practice of visualization via guided imagery. Patient was asked to participate in the technique introduced during session. LRT debriefed including topics of mindfulness, stress management and specific scenarios each patient could use these techniques. Patients were given suggestions of ways to access scripts post d/c and encouraged to explore Youtube and other apps available on smartphones, tablets, and computers.   Affect/Mood: Appropriate   Participation Level: Active   Participation Quality: Independent   Behavior: Appropriate   Speech/Thought Process: Focused   Insight: Good   Judgement: Good   Modes of Intervention: Script   Patient Response to Interventions:  Engaged   Education Outcome:  Acknowledges education and In group clarification offered    Clinical Observations/Individualized Feedback: Pt attended and participated in activity.    Plan: Continue to engage patient in RT group sessions 2-3x/week.   Victorino Sparrow, LRT,CTRS 12/10/2021 11:52 AM

## 2021-12-10 NOTE — Progress Notes (Signed)
D- Patient alert and oriented x4. Patient in stable mood.  Denies SI, HI, AVH. Pt interacting positively with staff and peers. Pt verbalized he no longer wants to take trazodone for insomnia because it made his nightmares very vivid. Pt states he takes his wife klonopin prescription about once a month to help with his anxiety and sleep. Provider made aware.    A- Scheduled medications administered to patient, per MD orders. One time order of melatonin given for insomnia. Routine safety checks conducted every 15 minutes.  Patient informed to notify staff with problems or concerns.   R- Patient compliant with medications and verbalized understanding treatment plan. Patient receptive, calm, and cooperative. PRN medication given for insomnia was effective.    12/09/21 2110  Psych Admission Type (Psych Patients Only)  Admission Status Voluntary  Psychosocial Assessment  Patient Complaints Insomnia  Eye Contact Fair  Facial Expression Animated  Affect Euphoric  Speech Logical/coherent  Interaction Assertive  Motor Activity Fidgety;Slow  Appearance/Hygiene Unremarkable  Behavior Characteristics Cooperative  Mood Pleasant  Aggressive Behavior  Effect No apparent injury  Thought Process  Coherency WDL  Content Confabulation;Blaming self  Delusions None reported or observed  Perception WDL  Hallucination None reported or observed  Judgment Impaired  Confusion None  Danger to Self  Current suicidal ideation? Denies  Danger to Others  Danger to Others None reported or observed

## 2021-12-10 NOTE — BH IP Treatment Plan (Signed)
Interdisciplinary Treatment and Diagnostic Plan Update  12/10/2021 Xavier Owens MRN: [id number]  Principal Diagnosis: MDD (major depressive disorder), recurrent episode, severe (HCC)  Secondary Diagnoses: Principal Problem:   MDD (major depressive disorder), recurrent episode, severe (HCC)   Current Medications:  Current Facility-Administered Medications  Medication Dose Route Frequency Provider Last Rate Last Admin   acetaminophen (TYLENOL) tablet 650 mg  650 mg Oral Q6H PRN Starkes-Perry, Juel Burrow, FNP       alum & mag hydroxide-simeth (MAALOX/MYLANTA) 200-200-20 MG/5ML suspension 30 mL  30 mL Oral Q4H PRN Starkes-Perry, Juel Burrow, FNP       hydrOXYzine (ATARAX) tablet 25 mg  25 mg Oral Q6H PRN Ajibola, Ene A, NP   25 mg at 12/09/21 2108   loperamide (IMODIUM) capsule 2-4 mg  2-4 mg Oral PRN Ajibola, Ene A, NP       LORazepam (ATIVAN) tablet 1 mg  1 mg Oral Q6H PRN Ajibola, Ene A, NP       magnesium hydroxide (MILK OF MAGNESIA) suspension 30 mL  30 mL Oral Daily PRN Starkes-Perry, Juel Burrow, FNP       multivitamin with minerals tablet 1 tablet  1 tablet Oral Daily Ajibola, Ene A, NP   1 tablet at 12/10/21 0809   ondansetron (ZOFRAN-ODT) disintegrating tablet 4 mg  4 mg Oral Q6H PRN Ajibola, Ene A, NP       thiamine (B-1) injection 100 mg  100 mg Intramuscular Once Ajibola, Ene A, NP       thiamine tablet 100 mg  100 mg Oral Daily Ajibola, Ene A, NP   100 mg at 12/10/21 0809   PTA Medications: Medications Prior to Admission  Medication Sig Dispense Refill Last Dose   chlordiazePOXIDE (LIBRIUM) 25 MG capsule 50mg  PO TID x 1D, then 25-50mg  PO BID X 1D, then 25-50mg  PO QD X 1D (Patient not taking: Reported on 12/07/2021) 15 capsule 0     Patient Stressors: Substance abuse    Patient Strengths: Capable of independent living  12/09/2021 for treatment/growth  Supportive family/friends  Work skills   Treatment Modalities: Medication Management, Group therapy, Case  management,  1 to 1 session with clinician, Psychoeducation, Recreational therapy.   Physician Treatment Plan for Primary Diagnosis: MDD (major depressive disorder), recurrent episode, severe (HCC) Long Term Goal(s):     Short Term Goals:    Medication Management: Evaluate patient's response, side effects, and tolerance of medication regimen.  Therapeutic Interventions: 1 to 1 sessions, Unit Group sessions and Medication administration.  Evaluation of Outcomes: Progressing  Physician Treatment Plan for Secondary Diagnosis: Principal Problem:   MDD (major depressive disorder), recurrent episode, severe (HCC)  Long Term Goal(s):     Short Term Goals:       Medication Management: Evaluate patient's response, side effects, and tolerance of medication regimen.  Therapeutic Interventions: 1 to 1 sessions, Unit Group sessions and Medication administration.  Evaluation of Outcomes: Progressing   RN Treatment Plan for Primary Diagnosis: MDD (major depressive disorder), recurrent episode, severe (HCC) Long Term Goal(s): Knowledge of disease and therapeutic regimen to maintain health will improve  Short Term Goals: Ability to demonstrate self-control, Ability to participate in decision making will improve, and Ability to identify and develop effective coping behaviors will improve  Medication Management: RN will administer medications as ordered by provider, will assess and evaluate patient's response and provide education to patient for prescribed medication. RN will report any adverse and/or side effects to prescribing provider.  Therapeutic Interventions:  1 on 1 counseling sessions, Psychoeducation, Medication administration, Evaluate responses to treatment, Monitor vital signs and CBGs as ordered, Perform/monitor CIWA, COWS, AIMS and Fall Risk screenings as ordered, Perform wound care treatments as ordered.  Evaluation of Outcomes: Progressing   LCSW Treatment Plan for Primary  Diagnosis: MDD (major depressive disorder), recurrent episode, severe (HCC) Long Term Goal(s): Safe transition to appropriate next level of care at discharge, Engage patient in therapeutic group addressing interpersonal concerns.  Short Term Goals: Engage patient in aftercare planning with referrals and resources, Increase social support, and Increase ability to appropriately verbalize feelings  Therapeutic Interventions: Assess for all discharge needs, 1 to 1 time with Social worker, Explore available resources and support systems, Assess for adequacy in community support network, Educate family and significant other(s) on suicide prevention, Complete Psychosocial Assessment, Interpersonal group therapy.  Evaluation of Outcomes: Progressing   Progress in Treatment: Attending groups: Yes. Participating in groups: Yes. Taking medication as prescribed: Yes. Toleration medication: Yes. Family/Significant other contact made: No, will contact:  girlfriend/wife Patient understands diagnosis: Yes. Discussing patient identified problems/goals with staff: Yes. Medical problems stabilized or resolved: Yes. Denies suicidal/homicidal ideation: Yes. Issues/concerns per patient self-inventory: No. Other: None  New problem(s) identified: No, Describe:  None  New Short Term/Long Term Goal(s):medication stabilization, elimination of SI thoughts, development of comprehensive mental wellness plan.   Patient Goals:  "alcoholism"  Discharge Plan or Barriers: Patient recently admitted. CSW will continue to follow and assess for appropriate referrals and possible discharge planning.   Reason for Continuation of Hospitalization: Medication stabilization Withdrawal symptoms  Estimated Length of Stay: 3-5 days   Scribe for Treatment Team: Chrys Racer 12/10/2021 11:27 AM

## 2021-12-10 NOTE — Progress Notes (Signed)
[facility] MD Progress Note  Patient Identification: Xavier Owens MRN: [id number] Date of Evaluation: 12/10/21 Chief Complaint: "Seizures" Principal Diagnosis: Major Depressive disorder, recurrent, severe Diagnosis: Alcohol Use Disorder   Subjective: Xavier Owens is a 51 year old male with PPHx of alcohol use disorder with Hx of seizures. He was transferred from [facility] ED to [facility], admitted voluntarily for suicidal thoughts without a plan.  The patient's chart was reviewed and nursing notes were reviewed. Over the past 24 hrs, there were no documented behavioral issues, no PRN medications given for agitation, and the patient was compliant with scheduled medications. The patient's case was discussed in multidisciplinary team meeting.   On interview and assessment this morning, the patient exhibits a linear, logical, and goal directed thought process with a normal affect. Patient reports that his mood, appetite, sleep are, "good." Patient denies any seizures, withdrawal symptoms, AVH, SI/HI, first rank symptoms, medication side effects, and was not grossly responding to internal stimuli during interview. Patient still rates his will to stop drinking alcohol as a 10/10. When told his wife said he is having symptoms of depression he denied this. Patient strongly denies any interpersonal problems with any of his relationships. Patient states that he is okay starting Remeron for sleep and mood symptoms.   Diagnosis:  Principal Problem:   MDD (major depressive disorder), recurrent episode, severe ([place])   Past Psychiatric History: See H&P  Past Medical History:  Past Medical History:  Diagnosis Date   Alcoholic ([place])    Hypertension    Seizures ([place])     Past Surgical History:  Procedure Laterality Date   dislocated hip     HAND SURGERY     KNEE SURGERY     Family History:  Family History  Problem Relation Age of Onset   Cancer Mother    Cancer Father    Family  Psychiatric  History: See H&P Social History:  Social History   Substance and Sexual Activity  Alcohol Use Yes   Comment: "a fifth of vodka every day for 30 years"     Social History   Substance and Sexual Activity  Drug Use No    Social History   Socioeconomic History   Marital status: Married    Spouse name: Not on file   Number of children: Not on file   Years of education: Not on file   Highest education level: Not on file  Occupational History   Not on file  Tobacco Use   Smoking status: Every Day    Packs/day: 0.10    Types: Cigarettes   Smokeless tobacco: Never  Vaping Use   Vaping Use: Never used  Substance and Sexual Activity   Alcohol use: Yes    Comment: "a fifth of vodka every day for 30 years"   Drug use: No   Sexual activity: Yes  Other Topics Concern   Not on file  Social History Narrative   Not on file   Social Determinants of Health   Financial Resource Strain: Not on file  Food Insecurity: Not on file  Transportation Needs: Not on file  Physical Activity: Not on file  Stress: Not on file  Social Connections: Not on file    Sleep: Per above  Appetite:  Per above  Current Medications: Current Facility-Administered Medications  Medication Dose Route Frequency Provider Last Rate Last Admin   acetaminophen (TYLENOL) tablet 650 mg  650 mg Oral Q6H PRN Burt Ek, Gayland Curry, FNP  alum & mag hydroxide-simeth (MAALOX/MYLANTA) 200-200-20 MG/5ML suspension 30 mL  30 mL Oral Q4H PRN Starkes-Perry, Gayland Curry, FNP       hydrOXYzine (ATARAX) tablet 25 mg  25 mg Oral Q6H PRN Ajibola, Ene A, NP   25 mg at 12/09/21 2108   loperamide (IMODIUM) capsule 2-4 mg  2-4 mg Oral PRN Ajibola, Ene A, NP       LORazepam (ATIVAN) tablet 1 mg  1 mg Oral Q6H PRN Ajibola, Ene A, NP       magnesium hydroxide (MILK OF MAGNESIA) suspension 30 mL  30 mL Oral Daily PRN Starkes-Perry, Gayland Curry, FNP       multivitamin with minerals tablet 1 tablet  1 tablet Oral Daily  Ajibola, Ene A, NP   1 tablet at 12/10/21 0809   ondansetron (ZOFRAN-ODT) disintegrating tablet 4 mg  4 mg Oral Q6H PRN Ajibola, Ene A, NP       thiamine (B-1) injection 100 mg  100 mg Intramuscular Once Ajibola, Ene A, NP       thiamine tablet 100 mg  100 mg Oral Daily Ajibola, Ene A, NP   100 mg at 12/10/21 0809    Lab Results:  Results for orders placed or performed during the hospital encounter of 12/08/21 (from the past 48 hour(s))  TSH     Status: Abnormal   Collection Time: 12/09/21  6:16 AM  Result Value Ref Range   TSH 5.277 (H) 0.350 - 4.500 uIU/mL    Comment: Performed by a 3rd Generation assay with a functional sensitivity of <=0.01 uIU/mL. Performed at [facility], [place] [address], [place], [place] [address]   Lipid panel     Status: Abnormal   Collection Time: 12/09/21  6:16 AM  Result Value Ref Range   Cholesterol 179 0 - 200 mg/dL   Triglycerides 90 <150 mg/dL   HDL 44 >40 mg/dL   Total CHOL/HDL Ratio 4.1 RATIO   VLDL 18 0 - 40 mg/dL   LDL Cholesterol 117 (H) 0 - 99 mg/dL    Comment:        Total Cholesterol/HDL:CHD Risk Coronary Heart Disease Risk Table                     Men   Women  1/2 Average Risk   3.4   3.3  Average Risk       5.0   4.4  2 X Average Risk   9.6   7.1  3 X Average Risk  23.4   11.0        Use the calculated Patient Ratio above and the CHD Risk Table to determine the patient's CHD Risk.        ATP III CLASSIFICATION (LDL):  <100     mg/dL   Optimal  100-129  mg/dL   Near or Above                    Optimal  130-159  mg/dL   Borderline  160-189  mg/dL   High  >190     mg/dL   Very High Performed at [facility] [address], [place], [place] [address]   Hemoglobin A1c     Status: None   Collection Time: 12/09/21  6:16 AM  Result Value Ref Range   Hgb A1c MFr Bld 5.0 4.8 - 5.6 %    Comment: (NOTE)         Prediabetes:  5.7 - 6.4         Diabetes: >6.4         Glycemic control for adults  with diabetes: <7.0    Mean Plasma Glucose 97 mg/dL    Comment: (NOTE) Performed At: [facility] [place], [place] [id number] Rush Farmer MD [id number]   Comprehensive metabolic panel     Status: Abnormal   Collection Time: 12/09/21  6:16 AM  Result Value Ref Range   Sodium 133 (L) 135 - 145 mmol/L   Potassium 3.6 3.5 - 5.1 mmol/L   Chloride 103 98 - 111 mmol/L   CO2 21 (L) 22 - 32 mmol/L   Glucose, Bld 97 70 - 99 mg/dL    Comment: Glucose reference range applies only to samples taken after fasting for at least 8 hours.   BUN 8 6 - 20 mg/dL   Creatinine, Ser 0.74 0.61 - 1.24 mg/dL   Calcium 9.3 8.9 - 10.3 mg/dL   Total Protein 8.4 (H) 6.5 - 8.1 g/dL   Albumin 3.9 3.5 - 5.0 g/dL   AST 70 (H) 15 - 41 U/L   ALT 32 0 - 44 U/L   Alkaline Phosphatase 108 38 - 126 U/L   Total Bilirubin 0.9 0.3 - 1.2 mg/dL   GFR, Estimated >60 >60 mL/min    Comment: (NOTE) Calculated using the CKD-EPI Creatinine Equation (2021)    Anion gap 9 5 - 15    Comment: Performed at [facility], [place] [address] [place], [place] [address]  TSH     Status: Abnormal   Collection Time: 12/10/21  6:20 AM  Result Value Ref Range   TSH 6.379 (H) 0.350 - 4.500 uIU/mL    Comment: Performed by a 3rd Generation assay with a functional sensitivity of <=0.01 uIU/mL. Performed at [facility], [place] [address], [place], [place] [address]   Magnesium     Status: None   Collection Time: 12/10/21  6:20 AM  Result Value Ref Range   Magnesium 1.9 1.7 - 2.4 mg/dL    Comment: Performed at [facility], [place] [address], [place], [place] [address]    Blood Alcohol level:  Lab Results  Component Value Date   ETH 79 (H) 12/07/2021   ETH <10 [id number]    Metabolic Disorder Labs: Lab Results  Component Value Date   HGBA1C 5.0 12/09/2021   MPG 97 12/09/2021   No results found for: PROLACTIN Lab Results  Component Value Date   CHOL  179 12/09/2021   TRIG 90 12/09/2021   HDL 44 12/09/2021   CHOLHDL 4.1 12/09/2021   VLDL 18 12/09/2021   LDLCALC 117 (H) 12/09/2021    Physical Findings: CIWA:  CIWA-Ar Total: 3 Psychiatric Specialty Exam: Physical Exam Constitutional:      General: He is not in acute distress.    Appearance: He is not toxic-appearing.  Pulmonary:     Effort: No respiratory distress.  Psychiatric:        Mood and Affect: Mood normal.        Behavior: Behavior normal.        Thought Content: Thought content normal.    Review of Systems  Eyes:  Negative for visual disturbance.  Respiratory:  Negative for shortness of breath.   Cardiovascular:  Negative for chest pain.  Gastrointestinal:  Negative for abdominal pain, constipation, diarrhea, nausea and vomiting.  Neurological:  Negative for tremors, seizures and headaches.  Psychiatric/Behavioral:  Negative  for hallucinations and suicidal ideas.    Blood pressure 118/87, pulse (!) 101, temperature 98 F (36.7 C), resp. rate 18, height 5\' 8"  (1.727 m), weight 84.8 kg, SpO2 99 %.Body mass index is 28.43 kg/m.  General Appearance:   Eye Contact:  Good  Speech:  Normal Rate  Volume:  Normal  Mood:  "Good"  Affect:  Full Range  Thought Process:  Goal Directed and Linear  Orientation:  Grossly oriented to person place and time  Thought Content:  Logical  Suicidal Thoughts:  No  Homicidal Thoughts:  No  Memory:  Immediate: fair, Recent: fair, Remote: fair  Judgement:  Poor  Insight:  Lacking  Psychomotor Activity:  Normal  Concentration:  poor  Recall:  fair  Fund of Knowledge:  fair  Language:  fair  Akathisia:  NA  Handed:  unknown  AIMS (if indicated):  NA  Assets:  Desire for Improvement Social Support  ADL's:  Intact  Cognition:  WNL  Sleep:  Number of Hours: 7     Treatment Plan & Assessment Summary: Principal Problem:   MDD (major depressive disorder), recurrent episode, severe ([place])  Physician Treatment Plan for Primary  Diagnosis:  Long Term Goal(s): Improvement in symptoms so as ready for discharge   Short Term Goals: Ability to maintain clinical measurements within normal limits will improve and Compliance with prescribed medications will improve   Safety and Monitoring -- Voluntary admission to inpatient psychiatric unit for safety, stabilization and treatment -- Daily contact with patient to assess and evaluate symptoms and progress in treatment -- Patient's case to be discussed in multi-disciplinary team meeting -- Observation Level : q15 minute checks -- Vital signs:  q12 hours -- Precautions: suicide -- Patient has several guns in his house that will need to be secured    Mild to Moderate Alcohol Use Disorder (r/o Substance Induced Mood Disorder and Substance Induced Anxiety Disorder) - Starting Remeron 7.5 mg PO QHS for sleep disturbances and depression - Daily thiamine injection 100 mg to prevent Wernicke's Encephalopathy - Last drink approximately 48 hrs previously, CIWA of 3 recently  - CIWA protocols in place with PRN Lorazepam for score >10 - Sz precautions - Will employ motivational interviewing in coming days - Patient does not meet criteria for psychotropic medications currently, will continue to assess. - Recheck Magnesium wnl 1.9 (1/27) - Trazodone for sleep and hydroxyzine PRN for anxiety/agitation   r/o Hypothyroidism  - TSH 5.2 on (1/26); Repeat TSH was 6.379;  - Will order free T3/T4  - consider starting levothyroxine if free T4 is low   Dispo Plan:  - Discharge to residential rehabilitation program   Medical Management Covid: negative  CMP: (1/24 and 1/26) K 3.1 -> 3.6, AST 101-> 70, Total Protein 8.4->8.7 Mg: 1.6 -> 1.9 (1/24) Acetaminophen: Q000111Q Salicylate: <7 CBC: wnl  EtOH: 79 on 1/24 UDS: negative (benzodiazepines given in ED) (1/24) TSH: 5.277-> 6.379 A1C: collected 1/26 pending Lipids: LDL 117 otw wnl (1/26) EKG: 451 QTcb   Sam Raines MS3  I personally  was present and performed or re-performed the history, physical exam and medical decision-making activities of this service and have verified that the service and findings are accurately documented in the students note.  Corky Sox, MD PGY-1

## 2021-12-10 NOTE — Progress Notes (Signed)
°   12/10/21 0614  Sleep  Number of Hours 7

## 2021-12-10 NOTE — B[facility] Group Notes (Signed)
Adult Psychoeducational Group Note  Date:  12/10/2021 Time:  2:36 PM  Group Topic/Focus:  Wellness Toolbox:   The focus of this group is to discuss various aspects of wellness, balancing those aspects and exploring ways to increase the ability to experience wellness.  Patients will create a wellness toolbox for use upon discharge.  Participation Level:  Active  Participation Quality:  Attentive  Affect:  Appropriate  Cognitive:  Alert  Insight: Appropriate  Engagement in Group:  Engaged  Modes of Intervention:  Activity  Additional Comments:  Patient attended and participated in the relaxation group activity.  Annie Sable 12/10/2021, 2:36 PM

## 2021-12-11 LAB — T4, FREE: Free T4: 0.91 ng/dL (ref 0.61–1.12)

## 2021-12-11 MED ORDER — TRAZODONE HCL 50 MG PO TABS
50.0000 mg | ORAL_TABLET | Freq: Once | ORAL | Status: AC
Start: 2021-12-11 — End: 2021-12-11
  Administered 2021-12-11: 50 mg via ORAL
  Filled 2021-12-11 (×2): qty 1

## 2021-12-11 NOTE — Progress Notes (Signed)
[facility] MD Progress Note  Patient Identification: Xavier Owens MRN: [id number] Date of Evaluation: 12/10/21 Chief Complaint: "Seizures" Principal Diagnosis: Major Depressive disorder, recurrent, severe Diagnosis: Alcohol Use Disorder   Subjective: Xavier Owens is a 51 year old male with PPHx of alcohol use disorder with Hx of seizures. He was transferred from [facility] ED to [facility], admitted voluntarily for suicidal thoughts without a plan.  The patient's chart was reviewed and nursing notes were reviewed. Over the past 24 hrs, there were no documented behavioral issues, no PRN medications given for agitation, and the patient was compliant with scheduled medications. The patient's case was discussed in multidisciplinary team meeting.   On interview and assessment this morning, the patient exhibits a linear, logical, and goal directed thought process with a normal affect.  He reports that the Remeron that was started last night "made me feel like a zombie".  He denies feeling nauseous or dizzy but reports that he felt "out of it" this morning.  Overall he reports that he is doing quite well.  When asked why, he reports "I have not had anything to drink".  He denies any cravings for alcohol at this point.  Patient reports that his mood, appetite, sleep are, "good." Patient denies any seizures, withdrawal symptoms, AVH, SI/HI, first rank symptoms, and was not grossly responding to internal stimuli during interview.   Diagnosis:  Principal Problem:   MDD (major depressive disorder), recurrent episode, severe ([facility])   Past Psychiatric History: See H&P  Past Medical History:  Past Medical History:  Diagnosis Date   Alcoholic ([facility])    Hypertension    Seizures ([facility])     Past Surgical History:  Procedure Laterality Date   dislocated hip     HAND SURGERY     KNEE SURGERY     Family History:  Family History  Problem Relation Age of Onset   Cancer Mother    Cancer  Father    Family Psychiatric  History: See H&P Social History:  Social History   Substance and Sexual Activity  Alcohol Use Yes   Comment: "a fifth of vodka every day for 30 years"     Social History   Substance and Sexual Activity  Drug Use No    Social History   Socioeconomic History   Marital status: Married    Spouse name: Not on file   Number of children: Not on file   Years of education: Not on file   Highest education level: Not on file  Occupational History   Not on file  Tobacco Use   Smoking status: Every Day    Packs/day: 0.10    Types: Cigarettes   Smokeless tobacco: Never  Vaping Use   Vaping Use: Never used  Substance and Sexual Activity   Alcohol use: Yes    Comment: "a fifth of vodka every day for 30 years"   Drug use: No   Sexual activity: Yes  Other Topics Concern   Not on file  Social History Narrative   Not on file   Social Determinants of Health   Financial Resource Strain: Not on file  Food Insecurity: Not on file  Transportation Needs: Not on file  Physical Activity: Not on file  Stress: Not on file  Social Connections: Not on file    Sleep: Per above  Appetite:  Per above  Current Medications: Current Facility-Administered Medications  Medication Dose Route Frequency Provider Last Rate Last Admin   acetaminophen (TYLENOL) tablet 650  mg  650 mg Oral Q6H PRN Starkes-Perry, Gayland Curry, FNP       alum & mag hydroxide-simeth (MAALOX/MYLANTA) 200-200-20 MG/5ML suspension 30 mL  30 mL Oral Q4H PRN Starkes-Perry, Gayland Curry, FNP       hydrOXYzine (ATARAX) tablet 25 mg  25 mg Oral Q6H PRN Ajibola, Ene A, NP   25 mg at 12/10/21 2136   loperamide (IMODIUM) capsule 2-4 mg  2-4 mg Oral PRN Ajibola, Ene A, NP       LORazepam (ATIVAN) tablet 1 mg  1 mg Oral Q6H PRN Ajibola, Ene A, NP       magnesium hydroxide (MILK OF MAGNESIA) suspension 30 mL  30 mL Oral Daily PRN Suella Broad, FNP       mirtazapine (REMERON) tablet 7.5 mg  7.5 mg Oral  QHS Corky Sox, MD   7.5 mg at 12/10/21 2138   multivitamin with minerals tablet 1 tablet  1 tablet Oral Daily Ajibola, Ene A, NP   1 tablet at 12/11/21 0924   ondansetron (ZOFRAN-ODT) disintegrating tablet 4 mg  4 mg Oral Q6H PRN Ajibola, Ene A, NP       thiamine (B-1) injection 100 mg  100 mg Intramuscular Once Ajibola, Ene A, NP       thiamine tablet 100 mg  100 mg Oral Daily Ajibola, Ene A, NP   100 mg at 12/11/21 [id number]    Lab Results:  Results for orders placed or performed during the hospital encounter of 12/08/21 (from the past 48 hour(s))  TSH     Status: Abnormal   Collection Time: 12/10/21  6:20 AM  Result Value Ref Range   TSH 6.379 (H) 0.350 - 4.500 uIU/mL    Comment: Performed by a 3rd Generation assay with a functional sensitivity of <=0.01 uIU/mL. Performed at [facility], [place] [address] [place], [place] [address]   Magnesium     Status: None   Collection Time: 12/10/21  6:20 AM  Result Value Ref Range   Magnesium 1.9 1.7 - 2.4 mg/dL    Comment: Performed at [facility], [place] [address] [place], [place] [address]  T4, free     Status: None   Collection Time: 12/10/21  6:23 PM  Result Value Ref Range   Free T4 0.91 0.61 - 1.12 ng/dL    Comment: (NOTE) Biotin ingestion may interfere with free T4 tests. If the results are inconsistent with the TSH level, previous test results, or the clinical presentation, then consider biotin interference. If needed, order repeat testing after stopping biotin. Performed at [facility], [place] [address] [place], [place] [address]     Blood Alcohol level:  Lab Results  Component Value Date   ETH 79 (H) 12/07/2021   ETH <10 [id number]    Metabolic Disorder Labs: Lab Results  Component Value Date   HGBA1C 5.0 12/09/2021   MPG 97 12/09/2021   No results found for: PROLACTIN Lab Results  Component Value Date   CHOL 179 12/09/2021   TRIG 90 12/09/2021   HDL 44 12/09/2021    CHOLHDL 4.1 12/09/2021   VLDL 18 12/09/2021   LDLCALC 117 (H) 12/09/2021    Physical Findings: CIWA:  CIWA-Ar Total: 0 Psychiatric Specialty Exam: Physical Exam Constitutional:      General: He is not in acute distress.    Appearance: He is not toxic-appearing.  Pulmonary:     Effort: No respiratory distress.  Psychiatric:  Mood and Affect: Mood normal.        Behavior: Behavior normal.        Thought Content: Thought content normal.    Review of Systems  Eyes:  Negative for visual disturbance.  Respiratory:  Negative for shortness of breath.   Cardiovascular:  Negative for chest pain.  Gastrointestinal:  Negative for abdominal pain, constipation, diarrhea, nausea and vomiting.  Neurological:  Negative for tremors, seizures and headaches.  Psychiatric/Behavioral:  Negative for hallucinations and suicidal ideas.    Blood pressure 131/88, pulse (!) 104, temperature (!) 97.4 F (36.3 C), temperature source Oral, resp. rate 18, height 5\' 8"  (1.727 m), weight 84.8 kg, SpO2 100 %.Body mass index is 28.43 kg/m.  General Appearance:   Eye Contact:  Good  Speech:  Normal Rate  Volume:  Normal  Mood:  "Good"  Affect:  Full Range  Thought Process:  Goal Directed and Linear  Orientation:  Grossly oriented to person place and time  Thought Content:  Patient denied SI/HI/AVH, delusions, paranoia, first rank symptoms. Patient is not grossly responding to internal/external stimuli on exam and did not make delusional statements.   Suicidal Thoughts:  No  Homicidal Thoughts:  No  Memory:  Immediate: fair, Recent: fair, Remote: fair  Judgement:  Poor  Insight:  Lacking  Psychomotor Activity:  Normal  Concentration:  poor  Recall:  fair  Fund of Knowledge:  fair  Language:  fair  Akathisia:  NA  Handed:  unknown  AIMS (if indicated):  NA  Assets:  Desire for Improvement Social Support  ADL's:  Intact  Cognition:  WNL  Sleep:  Number of Hours: 7     Treatment Plan &  Assessment Summary: Principal Problem:   MDD (major depressive disorder), recurrent episode, severe ([place])  Physician Treatment Plan for Primary Diagnosis:  Long Term Goal(s): Improvement in symptoms so as ready for discharge   Short Term Goals: Ability to maintain clinical measurements within normal limits will improve and Compliance with prescribed medications will improve   Safety and Monitoring -- Voluntary admission to inpatient psychiatric unit for safety, stabilization and treatment -- Daily contact with patient to assess and evaluate symptoms and progress in treatment -- Patient's case to be discussed in multi-disciplinary team meeting -- Observation Level : q15 minute checks -- Vital signs:  q12 hours -- Precautions: suicide -- Patient has several guns in his house that will need to be secured    Mild to Moderate Alcohol Use Disorder (r/o Substance Induced Mood Disorder and Substance Induced Anxiety Disorder) - Discontinue Remeron 7.5 mg PO QHS for sleep disturbances and depression, given side effects - Daily thiamine injection 100 mg to prevent Wernicke's Encephalopathy - Last drink approximately 72 hrs previously, CIWA of 2 recently  - CIWA protocols in place with PRN Lorazepam for score >10 - Sz precautions - Motivational interviewing - Recheck Magnesium wnl 1.9 (1/27)   r/o Hypothyroidism  - TSH 5.2 on (1/26); Repeat TSH was 6.379;  -T4 nml, T3 pending - consider starting levothyroxine if free T4 is low   Dispo Plan:  - Discharge to residential rehabilitation program   Medical Management Covid: negative  CMP: (1/24 and 1/26) K 3.1 -> 3.6, AST 101-> 70, Total Protein 8.4->8.7 Mg: 1.6 -> 1.9 (1/24) Acetaminophen: Q000111Q Salicylate: <7 CBC: wnl  EtOH: 79 on 1/24 UDS: negative (benzodiazepines given in ED) (1/24) TSH: 5.277-> 6.379 A1C: collected 1/26 pending Lipids: LDL 117 otw wnl (1/26) EKG: [place],  MD PGY-1

## 2021-12-11 NOTE — [facility] Group Notes (Signed)
Date:12/11/21 Time: 1300-1400    Purpose of Group: . The group focus' on teaching patients on how to identify their needs and their Life Skills:  A group where two lists are made. What people need and what are things that we do that are unhealthy. The lists are developed by the patients and it is explained that we often do the actions that are not healthy to get our list of needs met.  Goal:: to develop the coping skills needed to get their needs met  Participation Level:  Active  Participation Quality:  Appropriate  Affect:  Appropriate  Cognitive:  Oriented  Insight:  Improving  Engagement in Group:  Engaged  Additional Comments:  Pt rates his energy at a 10+. Participated in the group, providing feedback for others and himself.  Paulino Rily

## 2021-12-11 NOTE — Progress Notes (Signed)
[facility] Group Notes:  (Nursing/MHT/Case Management/Adjunct)  Date:  12/11/2021  Time:  2015  Type of Therapy:   wrap up group  Participation Level:  Active  Participation Quality:  Appropriate, Attentive, Sharing, and Supportive  Affect:  Appropriate  Cognitive:  Alert  Insight:  Improving  Engagement in Group:  Engaged  Modes of Intervention:  Clarification, Education, and Support  Summary of Progress/Problems: Positive thinking and positive change were discussed. Pt reports being grateful for waking up today and plans to live out the rest of his life in a healthy manner. Pt enjoyed peer patient support and socialization today.   Shellia Cleverly 12/11/2021, 8:57 PM

## 2021-12-11 NOTE — Plan of Care (Signed)
  Problem: Education: Goal: Ability to state activities that reduce stress will improve Outcome: Progressing   Problem: Self-Concept: Goal: Ability to identify factors that promote anxiety will improve Outcome: Progressing Goal: Level of anxiety will decrease Outcome: Progressing   

## 2021-12-11 NOTE — Progress Notes (Signed)
°   12/11/21 1100  Psych Admission Type (Psych Patients Only)  Admission Status Voluntary  Psychosocial Assessment  Eye Contact Fair  Facial Expression Animated  Affect Euphoric  Speech Logical/coherent  Interaction Assertive  Motor Activity Slow;Fidgety  Appearance/Hygiene Unremarkable  Behavior Characteristics Cooperative;Appropriate to situation  Mood Anxious  Aggressive Behavior  Effect No apparent injury  Thought Process  Coherency WDL  Content Blaming self  Delusions None reported or observed  Perception WDL  Hallucination None reported or observed  Judgment Impaired  Confusion None  Danger to Self  Current suicidal ideation? Denies  Danger to Others  Danger to Others None reported or observed

## 2021-12-11 NOTE — Progress Notes (Signed)
° ° °   12/10/21 2138  Psych Admission Type (Psych Patients Only)  Admission Status Voluntary  Psychosocial Assessment  Patient Complaints None  Eye Contact Fair  Facial Expression Animated  Affect Euphoric  Speech Logical/coherent  Interaction Assertive  Motor Activity Slow;Fidgety  Appearance/Hygiene Unremarkable  Behavior Characteristics Cooperative;Appropriate to situation  Mood Pleasant;Anxious  Thought Process  Coherency WDL  Content Confabulation;Blaming self  Delusions None reported or observed  Perception WDL  Hallucination None reported or observed  Judgment Impaired  Confusion None  Danger to Self  Current suicidal ideation? Denies  Danger to Others  Danger to Others None reported or observed

## 2021-12-11 NOTE — Progress Notes (Signed)
D. Pt has been calm and cooperative- visible in the milieu interacting appropriately with  peers,  and observed attending groups. Per pt's self inventory, pt rated his depression, hopelessness and anxiety all 0's. Pt denied SI/HI and A/VH and withdrawal symptoms. Pt reported that his goal today was "to work on going home." A. Labs and vitals monitored.Pt supported emotionally and encouraged to express concerns and ask questions.   R. Pt remains safe with 15 minute checks. Will continue POC.

## 2021-12-11 NOTE — Progress Notes (Signed)
°   12/11/21 2315  Psych Admission Type (Psych Patients Only)  Admission Status Voluntary  Psychosocial Assessment  Patient Complaints None  Eye Contact Fair  Facial Expression Animated  Affect Appropriate to circumstance  Speech Logical/coherent  Interaction Assertive  Motor Activity Other (Comment) (WDL)  Appearance/Hygiene Unremarkable  Behavior Characteristics Appropriate to situation  Mood Pleasant  Thought Process  Coherency WDL  Content Blaming self  Delusions None reported or observed  Perception WDL  Hallucination None reported or observed  Judgment Impaired  Confusion None  Danger to Self  Current suicidal ideation? Denies  Danger to Others  Danger to Others None reported or observed

## 2021-12-11 NOTE — [facility] Group Notes (Signed)
Goals Group 12/11/21    Group Focus: affirmation, clarity of thought, and goals/reality orientation Treatment Modality:  Psychoeducation Interventions utilized were assignment, group exercise, and support Purpose: To be able to understand and verbalize the reason for their admission to the hospital. To understand that the medication helps with their chemical imbalance but they also need to work on their choices in life. To be challenged to develop a list of 30 positives about themselves. Also introduce the concept that "feelings" are not reality.  Participation Level:  Active  Participation Quality:  Appropriate  Affect:  Appropriate  Cognitive:  Appropriate  Insight:  Improving  Engagement in Group:  Engaged  Additional Comments:  Attended the group. Could not give a an energy level. States he is here due to alcohol abuse. Was withdrawn during the group.  Dione Housekeeper

## 2021-12-11 NOTE — Group Note (Signed)
LCSW Group Therapy Note  Group Date: 12/11/2021 Start Time: 1030 End Time: 1130   Type of Therapy and Topic:  Group Therapy - Healthy vs Unhealthy Coping Skills  Participation Level:  Minimal   Description of Group The focus of this group was to determine what unhealthy coping techniques typically are used by group members and what healthy coping techniques would be helpful in coping with various problems. Patients were guided in becoming aware of the differences between healthy and unhealthy coping techniques. Patients were asked to identify 2-3 healthy coping skills they would like to learn to use more effectively.  Therapeutic Goals Patients learned that coping is what human beings do all day long to deal with various situations in their lives Patients defined and discussed healthy vs unhealthy coping techniques Patients identified their preferred coping techniques and identified whether these were healthy or unhealthy Patients determined 2-3 healthy coping skills they would like to become more familiar with and use more often. 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  He left group for awhile, but did return.  . Patient proved open to input from peers and feedback from [place]. Patient demonstrated growing insight into the subject matter, was respectful of peers, and participated when present.   Therapeutic Modalities Cognitive Behavioral Therapy Motivational Interviewing  Marquette Old 12/11/2021  1:46 PM

## 2021-12-12 LAB — T3, FREE

## 2021-12-12 MED ORDER — TRAZODONE HCL 50 MG PO TABS
50.0000 mg | ORAL_TABLET | Freq: Once | ORAL | Status: DC
  Filled 2021-12-12: qty 1

## 2021-12-12 NOTE — Group Note (Signed)
[facility] LCSW Group Therapy Note  12/12/2021  10:00-11:00AM  Type of Therapy and Topic:  Group Therapy:  Building Supports  Participation Level:  Active   Description of Group:  Patients in this group were introduced to the idea of adding a variety of healthy supports to address the various needs in their lives.  Different types of support were defined and described, and patients were asked to act out what each type could be.  Patients discussed what additional healthy supports could be helpful in their recovery and wellness after discharge in order to prevent future hospitalizations.   An emphasis was placed on following up with the discharge plan when they leave the hospital in order to continue becoming healthier and happier.  Two songs were played during group to help further patients' understanding.  Therapeutic Goals: 1)  demonstrate the importance of adding supports  2)  discuss 4 definitions of support  3)  identify the patient's current level of healthy support and   4)  elicit commitments to add one healthy support   Summary of Patient Progress:  The patient expressed full comprehension of the concepts presented, and agreed that there is a need to add more supports.  The patient indicated one healthy support that could be helpful if added would be a therapist.    Therapeutic Modalities:   Motivational Interviewing Brief Solution-Focused Therapy  Lynnell Chad

## 2021-12-12 NOTE — Progress Notes (Signed)
Patient rated his day as a 10 out to 10 since he enjoyed the groups and talking to the staff. His goal for tomorrow is to remain "being positive".

## 2021-12-12 NOTE — [facility] Group Notes (Signed)
Psychoeducational Group Note  Date:  11/21/2021 Time:  1300-1400   Group Topic/Focus: This is a continuation of the group from Saturday. Pt's have been asked to formulate a list of 30 positives about themselves. This list is to be read 2 times a day for 30 days, looking in a mirror. Changing patterns of negative self talk. Also discussed is the fact that there have been some people who hurt [place] in the past. We keep that memory alive within [place]. Ways to cope with this are discused   Participation Level:  Active  Participation Quality:  Appropriate  Affect:  Appropriate  Cognitive:  Oriented  Insight: Improving  Engagement in Group:  Engaged  Modes of Intervention:  Activity, Discussion, Education, and Support  Additional Comments:  Pt rates his energy at a 10+. Participates fully in the group.  Paulino Rily

## 2021-12-12 NOTE — Progress Notes (Signed)
D. Pt presents with a pleasant mood- smiles upon approach, and has been calm and cooperative on the unit- Pt observed in the milieu interacting appropriately with peers, and observed attending groups. Per pt's self inventory, pt rated his depression, hopelessness and anxiety all 0's.  Pt currently denies SI/HI and AVH A. Labs and vitals monitored.  Pt supported emotionally and encouraged to express concerns and ask questions.   R. Pt remains safe with 15 minute checks. Will continue POC.

## 2021-12-12 NOTE — [facility] Group Notes (Signed)
Adult Psychoeducational Group Not Date:  12/12/2021 Time:  [id number] Group Topic/Focus: PROGRESSIVE RELAXATION. A group where deep breathing is taught and tensing and relaxation muscle groups is used. Imagery is used as well.  Pts are asked to imagine 3 pillars that hold them up when they are not able to hold themselves up.  Participation Level:  Active  Participation Quality:  Appropriate  Affect:  Appropriate  Cognitive:  Oriented  Insight: Improving  Engagement in Group:  Engaged  Modes of Intervention:  Activity, Discussion, Education, and Support  Additional Comments:  Pt states his energy is a 10+/10. States what holds him up is his daughter, his daughter and his daughter. Smiled broadly when saying this.  Paulino Rily

## 2021-12-12 NOTE — Progress Notes (Addendum)
[facility] MD Progress Note  Patient Identification: Xavier Owens MRN: [id number] Date of Evaluation: 12/12/21 Chief Complaint: "Seizures" Principal Diagnosis: Major Depressive disorder, recurrent, severe Diagnosis: Alcohol Use Disorder   Reason for Admission: Xavier Owens is a 51 year old male with PPHx of alcohol use disorder with Hx of seizures. He was transferred from [facility] ED to [facility], admitted voluntarily for suicidal thoughts without a plan. Today's Note:  Chart reviewed, nursing report obtained and care reviewed with members of our interdisciplinary team.  Patient was seen in the office and he reported much improvement.  He is aware of his excessive use of Alcohol and is seeking long term inpatient alcohol treatment.  He denied feeling depressed or suicidal.  He repeated that he came in for Alcohol use stating it is time for him to address his drinking.  SW/Case Managers are looking for a facility that offers long term Alcohol treatment.  Meanwhile patient is compliant with medications and participates in group activities.  He denied SI/HI/AVH and no mention of Paranoia. Diagnosis:  Principal Problem:   MDD (major depressive disorder), recurrent episode, severe ([place])   Past Psychiatric History: See H&P  Past Medical History:  Past Medical History:  Diagnosis Date   Alcoholic ([place])    Hypertension    Seizures ([place])     Past Surgical History:  Procedure Laterality Date   dislocated hip     HAND SURGERY     KNEE SURGERY     Family History:  Family History  Problem Relation Age of Onset   Cancer Mother    Cancer Father    Family Psychiatric  History: See H&P Social History:  Social History   Substance and Sexual Activity  Alcohol Use Yes   Comment: "a fifth of vodka every day for 30 years"     Social History   Substance and Sexual Activity  Drug Use No    Social History   Socioeconomic History   Marital status: Married    Spouse name:  Not on file   Number of children: Not on file   Years of education: Not on file   Highest education level: Not on file  Occupational History   Not on file  Tobacco Use   Smoking status: Every Day    Packs/day: 0.10    Types: Cigarettes   Smokeless tobacco: Never  Vaping Use   Vaping Use: Never used  Substance and Sexual Activity   Alcohol use: Yes    Comment: "a fifth of vodka every day for 30 years"   Drug use: No   Sexual activity: Yes  Other Topics Concern   Not on file  Social History Narrative   Not on file   Social Determinants of Health   Financial Resource Strain: Not on file  Food Insecurity: Not on file  Transportation Needs: Not on file  Physical Activity: Not on file  Stress: Not on file  Social Connections: Not on file    Sleep: Good  Appetite:  Good  Current Medications: Current Facility-Administered Medications  Medication Dose Route Frequency Provider Last Rate Last Admin   acetaminophen (TYLENOL) tablet 650 mg  650 mg Oral Q6H PRN Starkes-Perry, Gayland Curry, FNP       alum & mag hydroxide-simeth (MAALOX/MYLANTA) 200-200-20 MG/5ML suspension 30 mL  30 mL Oral Q4H PRN Starkes-Perry, Gayland Curry, FNP       magnesium hydroxide (MILK OF MAGNESIA) suspension 30 mL  30 mL Oral Daily PRN Sheran Fava  S, FNP       multivitamin with minerals tablet 1 tablet  1 tablet Oral Daily Ajibola, Ene A, NP   1 tablet at 12/12/21 0759   thiamine (B-1) injection 100 mg  100 mg Intramuscular Once Ajibola, Ene A, NP       thiamine tablet 100 mg  100 mg Oral Daily Ajibola, Ene A, NP   100 mg at 12/12/21 [id number]    Lab Results:  Results for orders placed or performed during the hospital encounter of 12/08/21 (from the past 48 hour(s))  T4, free     Status: None   Collection Time: 12/10/21  6:23 PM  Result Value Ref Range   Free T4 0.91 0.61 - 1.12 ng/dL    Comment: (NOTE) Biotin ingestion may interfere with free T4 tests. If the results are inconsistent with the TSH level,  previous test results, or the clinical presentation, then consider biotin interference. If needed, order repeat testing after stopping biotin. Performed at [facility], [place] [address] [place], [place] [address]     Blood Alcohol level:  Lab Results  Component Value Date   ETH 79 (H) 12/07/2021   ETH <10 [id number]    Metabolic Disorder Labs: Lab Results  Component Value Date   HGBA1C 5.0 12/09/2021   MPG 97 12/09/2021   No results found for: PROLACTIN Lab Results  Component Value Date   CHOL 179 12/09/2021   TRIG 90 12/09/2021   HDL 44 12/09/2021   CHOLHDL 4.1 12/09/2021   VLDL 18 12/09/2021   LDLCALC 117 (H) 12/09/2021    Physical Findings: CIWA:  CIWA-Ar Total: 1 Psychiatric Specialty Exam: Physical Exam Constitutional:      General: He is not in acute distress.    Appearance: He is not toxic-appearing.  Pulmonary:     Effort: No respiratory distress.  Psychiatric:        Mood and Affect: Mood normal.        Behavior: Behavior normal.        Thought Content: Thought content normal.    Review of Systems  Eyes:  Negative for visual disturbance.  Respiratory:  Negative for shortness of breath.   Cardiovascular:  Negative for chest pain.  Gastrointestinal:  Negative for abdominal pain, constipation, diarrhea, nausea and vomiting.  Neurological:  Negative for tremors, seizures and headaches.  Psychiatric/Behavioral:  Negative for hallucinations and suicidal ideas.    Blood pressure 115/79, pulse (!) 114, temperature 98.3 F (36.8 C), temperature source Oral, resp. rate 16, height 5\' 8"  (1.727 m), weight 84.8 kg, SpO2 97 %.Body mass index is 28.43 kg/m.  General Appearance:   Eye Contact:  Good  Speech:  Normal Rate  Volume:  Normal  Mood:  "Good"  Affect:  Full Range  Thought Process:  Goal Directed and Linear  Orientation:  Grossly oriented to person place and time  Thought Content:  Patient denied SI/HI/AVH, delusions, paranoia, first rank  symptoms. Patient is not grossly responding to internal/external stimuli on exam and did not make delusional statements.   Suicidal Thoughts:  No  Homicidal Thoughts:  No  Memory:  Immediate: fair, Recent: fair, Remote: fair  Judgement:  Poor  Insight:  Lacking  Psychomotor Activity:  Normal  Concentration:  poor  Recall:  fair  Fund of Knowledge:  fair  Language:  fair  Akathisia:  NA  Handed:  unknown  AIMS (if indicated):  NA  Assets:  Desire for Improvement Social Support  ADL's:  Intact  Cognition:  WNL  Sleep:  Number of Hours: 7   Treatment Plan & Assessment Summary: Principal Problem:   MDD (major depressive disorder), recurrent episode, severe (HCC) Continue Multivitamin with Mineral 1 tablet po daily Thiamine tablet 100 mg po daily Offer PRN medications per protocol Continue Seizure precautions Monitor Q 15 Minutes  Encourage group participation. Physician Treatment Plan for Primary Diagnosis:  Long Term Goal(s): Improvement in symptoms so as ready for discharge   Short Term Goals: Ability to maintain clinical measurements within normal limits will improve and Compliance with prescribed medications will improve Charmaine Downs NP-PMHNP-BC 12/12/2021,01:43 PM [facility]

## 2021-12-13 MED ORDER — HYDROXYZINE HCL 50 MG PO TABS
50.0000 mg | ORAL_TABLET | Freq: Every day | ORAL | Status: DC
  Administered 2021-12-13 – 2021-12-14 (×2): 50 mg via ORAL
  Filled 2021-12-13: qty 7
  Filled 2021-12-13 (×4): qty 1

## 2021-12-13 MED ORDER — DOXEPIN HCL 10 MG PO CAPS
10.0000 mg | ORAL_CAPSULE | Freq: Once | ORAL | Status: AC
  Administered 2021-12-13: 10 mg via ORAL
  Filled 2021-12-13 (×2): qty 1

## 2021-12-13 NOTE — Group Note (Signed)
Date:  12/13/2021 Time:  9:42 AM  Group Topic/Focus:  Goals Group:   The focus of this group is to help patients establish daily goals to achieve during treatment and discuss how the patient can incorporate goal setting into their daily lives to aide in recovery. Orientation:   The focus of this group is to educate the patient on the purpose and policies of crisis stabilization and provide a format to answer questions about their admission.  The group details unit policies and expectations of patients while admitted.    Participation Level:  Active  Participation Quality:  Appropriate  Affect:  Appropriate  Cognitive:  Appropriate  Insight: Appropriate  Engagement in Group:  Engaged  Modes of Intervention:  Discussion  Additional Comments:  Pt wants to talk about discharge with the Dr.  Garvin Fila 12/13/2021, 9:42 AM

## 2021-12-13 NOTE — Group Note (Signed)
Date:  12/13/2021 Time:  10:14 AM  Group Topic/Focus:  Goals Group:   The focus of this group is to help patients establish daily goals to achieve during treatment and discuss how the patient can incorporate goal setting into their daily lives to aide in recovery. Orientation:   The focus of this group is to educate the patient on the purpose and policies of crisis stabilization and provide a format to answer questions about their admission.  The group details unit policies and expectations of patients while admitted.    Participation Level:  Active  Participation Quality:  Appropriate  Affect:  Appropriate  Cognitive:  Appropriate  Insight: Appropriate  Engagement in Group:  Engaged  Modes of Intervention:  Discussion  Additional Comments:  Pt wants to get more rest and be discharged  Jaquita Rector 12/13/2021, 10:14 AM

## 2021-12-13 NOTE — [facility] Group Notes (Signed)
Psychoeducational Group- Pts were read ''There's a hole in my sidewalk'' pt Xavier Owens. Pts were asked to identify negative behavioral patterns as it relates to their mental health. Pt attended and was appropriate.

## 2021-12-13 NOTE — Progress Notes (Signed)
D:  Patient denied SI and HI, contracts for safety.  Denied A/V hallucinations.  A:  Medications administered per MD orders.  Emotional support and encouragement given patient. R:  Safety maintained with 15 minute checks.  Patient stated he did not sleep well last night  Will discuss night medications with MD today.

## 2021-12-13 NOTE — Group Note (Deleted)
Date:  12/13/2021 °Time:  9:34 AM ° °Group Topic/Focus:  °Goals Group:   The focus of this group is to help patients establish daily goals to achieve during treatment and discuss how the patient can incorporate goal setting into their daily lives to aide in recovery. °Orientation:   The focus of this group is to educate the patient on the purpose and policies of crisis stabilization and provide a format to answer questions about their admission.  The group details unit policies and expectations of patients while admitted. ° ° ° ° °Participation Level:  {[facility] PARTICIPATION LEVEL:22264} ° °Participation Quality:  {[facility] PARTICIPATION QUALITY:22265} ° °Affect:  {[facility] AFFECT:22266} ° °Cognitive:  {[facility] COGNITIVE:22267} ° °Insight: {[facility] Insight2:20797} ° °Engagement in Group:  {[facility] ENGAGEMENT IN GROUP:22268} ° °Modes of Intervention:  {[facility] MODES OF INTERVENTION:22269} ° °Additional Comments:  *** ° °Gabriella Woodhead Lashawn Antrice Pal °12/13/2021, 9:34 AM ° °

## 2021-12-13 NOTE — Progress Notes (Signed)
°   12/13/21 2107  Psych Admission Type (Psych Patients Only)  Admission Status Voluntary  Psychosocial Assessment  Patient Complaints None  Eye Contact Fair  Facial Expression Animated  Affect Appropriate to circumstance  Speech Logical/coherent  Interaction Assertive  Motor Activity Other (Comment) (wnl)  Appearance/Hygiene Unremarkable  Behavior Characteristics Cooperative;Anxious  Mood Anxious  Thought Process  Coherency WDL  Content WDL  Delusions None reported or observed  Perception WDL  Hallucination None reported or observed  Judgment Impaired  Confusion None  Danger to Self  Current suicidal ideation? Denies  Danger to Others  Danger to Others None reported or observed   Pt seen after wrap up group tonight. Pt denies SI, HI, AVH and pain. Denies anxiety and depression. Says his day was not good because he has not been sleeping. "I've been up since 6 am yesterday." Pt says he was given medication last night that did not work. Pt given something different for sleep tonight. Will monitor it.

## 2021-12-13 NOTE — Group Note (Deleted)
Date:  12/13/2021 °Time:  9:38 AM ° °Group Topic/Focus:  °Goals Group:   The focus of this group is to help patients establish daily goals to achieve during treatment and discuss how the patient can incorporate goal setting into their daily lives to aide in recovery. °Orientation:   The focus of this group is to educate the patient on the purpose and policies of crisis stabilization and provide a format to answer questions about their admission.  The group details unit policies and expectations of patients while admitted. ° ° ° ° °Participation Level:  {[facility] PARTICIPATION LEVEL:22264} ° °Participation Quality:  {[facility] PARTICIPATION QUALITY:22265} ° °Affect:  {[facility] AFFECT:22266} ° °Cognitive:  {[facility] COGNITIVE:22267} ° °Insight: {[facility] Insight2:20797} ° °Engagement in Group:  {[facility] ENGAGEMENT IN GROUP:22268} ° °Modes of Intervention:  {[facility] MODES OF INTERVENTION:22269} ° °Additional Comments:  *** ° °Ndeye Tenorio Lashawn Tahirih Lair °12/13/2021, 9:38 AM ° °

## 2021-12-13 NOTE — Group Note (Signed)
°  Type of Therapy and Topic:  Group Therapy:  Healthy and Unhealthy Supports  Participation Level:  Active   Description of Group:  Patients in this group were introduced to the idea of adding a variety of healthy supports to address the various needs in their lives.Patients discussed what additional healthy supports could be helpful in their recovery and wellness after discharge in order to prevent future hospitalizations.   An emphasis was placed on using counselor, doctor, therapy groups, 12-step groups, and problem-specific support groups to expand supports.  They also worked as a group on developing a specific plan for several patients to deal with unhealthy supports through boundary-setting, psychoeducation with loved ones, and even termination of relationships.   Therapeutic Goals:   1)  discuss importance of adding supports to stay well once out of the hospital  2)  compare healthy versus unhealthy supports and identify some examples of each  3)  generate ideas and descriptions of healthy supports that can be added  4)  offer mutual support about how to address unhealthy supports  5)  encourage active participation in and adherence to discharge plan    Summary of Patient Progress:  Patient participated appropriately in groups.  Patient participated in discussion and processed supports in life through an activity.  Patient offered and accepted feedback from other peers in the group.    Therapeutic Modalities:   Motivational Interviewing Brief Solution-Focused Therapy  Beatris Si, LCSW 12/13/2021  1:44 PM

## 2021-12-13 NOTE — Group Note (Signed)
Recreation Therapy Group Note   Group Topic:Stress Management  Group Date: 12/13/2021 Start Time: 0935 End Time: 0952 Facilitators: Caroll Rancher, [place] Location: [address] Dayroom   Goal Area(s) Addresses:  Patient will identify positive stress management techniques. Patient will identify benefits of using stress management post d/c.  Group Description:  Meditation.  LRT played a meditation that focused on taking good intentions and positive thoughts into your day.  Patients were to listen and follow along as the meditation played to fully engage in the activity.   Affect/Mood: Appropriate   Participation Level: Active   Participation Quality: Independent   Behavior: Appropriate   Speech/Thought Process: Focused   Insight: Good   Judgement: Good   Modes of Intervention: Meditation   Patient Response to Interventions:  Engaged   Education Outcome:  Acknowledges education and In group clarification offered    Clinical Observations/Individualized Feedback: Pt attended and participated in activity.    Plan: Continue to engage patient in RT group sessions 2-3x/week.   Caroll Rancher, LRT,CTRS 12/13/2021 12:12 PM

## 2021-12-13 NOTE — Plan of Care (Signed)
Nurse discussed anxiety, depression and coping skills with patient.  

## 2021-12-13 NOTE — Progress Notes (Signed)
°   12/12/21 2249  Psych Admission Type (Psych Patients Only)  Admission Status Voluntary  Psychosocial Assessment  Patient Complaints None  Eye Contact Fair  Facial Expression Animated  Affect Appropriate to circumstance  Speech Logical/coherent  Interaction Assertive  Motor Activity Other (Comment) (WDL)  Appearance/Hygiene Unremarkable  Behavior Characteristics Cooperative;Appropriate to situation  Mood Pleasant  Aggressive Behavior  Effect No apparent injury  Thought Process  Coherency WDL  Content WDL  Delusions None reported or observed  Perception WDL  Hallucination None reported or observed  Judgment Impaired  Confusion None  Danger to Self  Current suicidal ideation? Denies  Danger to Others  Danger to Others None reported or observed

## 2021-12-13 NOTE — Progress Notes (Signed)
[facility] MD Progress Note  Patient Identification: Xavier Owens MRN: [id number] Date of Evaluation: 12/10/21 Chief Complaint: "Seizures" Principal Diagnosis: Major Depressive disorder, recurrent, severe Diagnosis: Alcohol Use Disorder   Subjective: Xavier Owens is a 51 year old male with PPHx of alcohol use disorder with Hx of seizures. He was transferred from [facility] ED to [facility], admitted voluntarily for suicidal thoughts without a plan.  The patient's chart was reviewed and nursing notes were reviewed. Over the past 24 hrs, there were no documented behavioral issues, no PRN medications given for agitation, and the patient was compliant with scheduled medications. The patient's case was discussed in multidisciplinary team meeting.   On interview and assessment this morning, the patient exhibits a linear, logical, and goal directed thought process with a normal affect.  He reports that he had difficulty sleeping last night due to his roommate snoring.  He asked for medication to help with sleep.  He is amenable to starting hydroxyzine nightly.  He reports that he went to groups but that he had trouble at one of them.  He ended up talking about his [employer], which he says he does not like to talk about. The patient denies auditory/visual hallucinations and first rank symptoms.  He reports good mood and. He denies suicidal and homicidal thoughts. He denies side effects from his medications.  Review of systems as below.   Diagnosis:  Principal Problem:   MDD (major depressive disorder), recurrent episode, severe ([place])   Past Psychiatric History: See H&P  Past Medical History:  Past Medical History:  Diagnosis Date   Alcoholic ([place])    Hypertension    Seizures ([place])     Past Surgical History:  Procedure Laterality Date   dislocated hip     HAND SURGERY     KNEE SURGERY     Family History:  Family History  Problem Relation Age of Onset   Cancer Mother     Cancer Father    Family Psychiatric  History: See H&P Social History:  Social History   Substance and Sexual Activity  Alcohol Use Yes   Comment: "a fifth of vodka every day for 30 years"     Social History   Substance and Sexual Activity  Drug Use No    Social History   Socioeconomic History   Marital status: Married    Spouse name: Not on file   Number of children: Not on file   Years of education: Not on file   Highest education level: Not on file  Occupational History   Not on file  Tobacco Use   Smoking status: Every Day    Packs/day: 0.10    Types: Cigarettes   Smokeless tobacco: Never  Vaping Use   Vaping Use: Never used  Substance and Sexual Activity   Alcohol use: Yes    Comment: "a fifth of vodka every day for 30 years"   Drug use: No   Sexual activity: Yes  Other Topics Concern   Not on file  Social History Narrative   Not on file   Social Determinants of Health   Financial Resource Strain: Not on file  Food Insecurity: Not on file  Transportation Needs: Not on file  Physical Activity: Not on file  Stress: Not on file  Social Connections: Not on file    Sleep: Per above  Appetite:  Per above  Current Medications: Current Facility-Administered Medications  Medication Dose Route Frequency Provider Last Rate Last Admin  acetaminophen (TYLENOL) tablet 650 mg  650 mg Oral Q6H PRN Starkes-Perry, Gayland Curry, FNP       alum & mag hydroxide-simeth (MAALOX/MYLANTA) 200-200-20 MG/5ML suspension 30 mL  30 mL Oral Q4H PRN Starkes-Perry, Gayland Curry, FNP       hydrOXYzine (ATARAX) tablet 50 mg  50 mg Oral QHS Corky Sox, MD       magnesium hydroxide (MILK OF MAGNESIA) suspension 30 mL  30 mL Oral Daily PRN Starkes-Perry, Gayland Curry, FNP       multivitamin with minerals tablet 1 tablet  1 tablet Oral Daily Ajibola, Ene A, NP   1 tablet at 12/13/21 0815   thiamine (B-1) injection 100 mg  100 mg Intramuscular Once Ajibola, Ene A, NP       thiamine tablet 100  mg  100 mg Oral Daily Ajibola, Ene A, NP   100 mg at 12/13/21 0815    Lab Results:  No results found for this or any previous visit (from the past 48 hour(s)).   Blood Alcohol level:  Lab Results  Component Value Date   ETH 79 (H) 12/07/2021   ETH <10 [id number]    Metabolic Disorder Labs: Lab Results  Component Value Date   HGBA1C 5.0 12/09/2021   MPG 97 12/09/2021   No results found for: PROLACTIN Lab Results  Component Value Date   CHOL 179 12/09/2021   TRIG 90 12/09/2021   HDL 44 12/09/2021   CHOLHDL 4.1 12/09/2021   VLDL 18 12/09/2021   LDLCALC 117 (H) 12/09/2021    Physical Findings: CIWA:  CIWA-Ar Total: 1 Psychiatric Specialty Exam: Physical Exam Constitutional:      General: He is not in acute distress.    Appearance: He is not toxic-appearing.  Pulmonary:     Effort: No respiratory distress.  Psychiatric:        Mood and Affect: Mood normal.        Behavior: Behavior normal.        Thought Content: Thought content normal.    Review of Systems  Eyes:  Negative for visual disturbance.  Respiratory:  Negative for shortness of breath.   Cardiovascular:  Negative for chest pain.  Gastrointestinal:  Negative for abdominal pain, constipation, diarrhea, nausea and vomiting.  Neurological:  Negative for tremors, seizures and headaches.  Psychiatric/Behavioral:  Negative for hallucinations and suicidal ideas.    Blood pressure (!) 125/91, pulse 100, temperature 98.7 F (37.1 C), temperature source Oral, resp. rate 16, height 5\' 8"  (1.727 m), weight 84.8 kg, SpO2 97 %.Body mass index is 28.43 kg/m.  General Appearance:   Eye Contact:  Good  Speech:  Normal Rate  Volume:  Normal  Mood:  "Good"  Affect:  Full Range  Thought Process:  Goal Directed and Linear  Orientation:  Grossly oriented to person place and time  Thought Content:  Patient denied SI/HI/AVH, delusions, paranoia, first rank symptoms. Patient is not grossly responding to internal/external  stimuli on exam and did not make delusional statements.   Suicidal Thoughts:  No  Homicidal Thoughts:  No  Memory:  Immediate: fair, Recent: fair, Remote: fair  Judgement:  Poor  Insight:  Lacking  Psychomotor Activity:  Normal  Concentration:  poor  Recall:  fair  Fund of Knowledge:  fair  Language:  fair  Akathisia:  NA  Handed:  unknown  AIMS (if indicated):  NA  Assets:  Desire for Improvement Social Support  ADL's:  Intact  Cognition:  WNL  Sleep:  Number  of Hours: 7     Treatment Plan & Assessment Summary: Principal Problem:   MDD (major depressive disorder), recurrent episode, severe [facility])  Physician Treatment Plan for Primary Diagnosis:  Long Term Goal(s): Improvement in symptoms so as ready for discharge   Short Term Goals: Ability to maintain clinical measurements within normal limits will improve and Compliance with prescribed medications will improve   Safety and Monitoring -- Voluntary admission to inpatient psychiatric unit for safety, stabilization and treatment -- Daily contact with patient to assess and evaluate symptoms and progress in treatment -- Patient's case to be discussed in multi-disciplinary team meeting -- Observation Level : q15 minute checks -- Vital signs:  q12 hours -- Precautions: suicide -- Patient has several guns in his house that will need to be secured    Mild to Moderate Alcohol Use Disorder (r/o Substance Induced Mood Disorder and Substance Induced Anxiety Disorder) - Start Atarax 50 mg QHS for sleep - Daily thiamine injection 100 mg to prevent Wernicke's Encephalopathy - Last drink approximately 96 hrs previously, CIWA of 1 recently  - CIWA protocols in place with PRN Lorazepam for score >10 - Sz precautions - Motivational interviewing   r/o Hypothyroidism  - TSH 5.2 on (1/26); Repeat TSH was 6.379;  -T4 nml, T3 pending - consider starting levothyroxine if free T4 is low   Dispo Plan:  - Hopeful discharge to residential  rehabilitation program   Medical Management Covid: negative  CMP: (1/24 and 1/26) K 3.1 -> 3.6, AST 101-> 70, Total Protein 8.4->8.7 Mg: 1.6 -> 1.9 (1/24) Acetaminophen: Q000111Q Salicylate: <7 CBC: wnl  EtOH: 79 on 1/24 UDS: negative (benzodiazepines given in ED) (1/24) TSH: 5.277-> 6.379 A1C: collected 1/26 pending Lipids: LDL 117 otw wnl (1/26) EKG: 451 QTcb   Corky Sox, MD PGY-1

## 2021-12-14 DIAGNOSIS — F1994 Other psychoactive substance use, unspecified with psychoactive substance-induced mood disorder: Secondary | ICD-10-CM | POA: Diagnosis present

## 2021-12-14 LAB — RESP PANEL BY RT-PCR (FLU A&B, COVID) ARPGX2
Influenza A by PCR: NEGATIVE
Influenza B by PCR: NEGATIVE
SARS Coronavirus 2 by RT PCR: NEGATIVE

## 2021-12-14 MED ORDER — GABAPENTIN 600 MG PO TABS
300.0000 mg | ORAL_TABLET | Freq: Every day | ORAL | Status: DC
  Filled 2021-12-14: qty 0.5

## 2021-12-14 MED ORDER — QUETIAPINE FUMARATE 25 MG PO TABS
25.0000 mg | ORAL_TABLET | Freq: Every evening | ORAL | Status: DC | PRN
  Administered 2021-12-14: 25 mg via ORAL
  Filled 2021-12-14: qty 7
  Filled 2021-12-14: qty 1

## 2021-12-14 MED ORDER — GABAPENTIN 300 MG PO CAPS
ORAL_CAPSULE | ORAL | Status: AC
  Administered 2021-12-14: 300 mg
  Filled 2021-12-14: qty 1

## 2021-12-14 NOTE — Progress Notes (Signed)
Patient states that he had a nice day overall and that he anticipates being discharged tomorrow. His goal for tomorrow is to return to his home.

## 2021-12-14 NOTE — Progress Notes (Signed)
Patient stated he slept better last night from 11:30 pm until 6:00 am.  Took doxepin 10 mg and vistaril 50 mg.  Patient thinks he would be better to take doxepin and vistaril at same time.   That works best for him. Respirations even and unlabored.  No signs/symptoms of pain/distress noted on patient's face/body movements.  Patient denied SI and HI, contracts for safety.  Denied A/V hallucinations.  Denied pain.

## 2021-12-14 NOTE — BHH Suicide Risk Assessment (Incomplete)
[facility] Discharge Suicide Risk Assessment   Principal Problem: MDD (major depressive disorder), recurrent episode, severe (HCC) Discharge Diagnoses: Principal Problem:   MDD (major depressive disorder), recurrent episode, severe (HCC)  Xavier Owens is a 51 year old male with PPHx of alcohol use disorder with Hx of seizures. He was transferred from [facility] ED to [facility], admitted voluntarily for suicidal thoughts without a plan and detox. During his admission, the patient consistently denied suicidal thoughts and depression, though collateral from his wife suggested that he has been dealing with significant depression.  During the patient's hospitalization, patient had extensive initial psychiatric evaluation, and follow-up psychiatric evaluations every day.  Psychiatric diagnoses provided upon initial assessment:  Alcohol use disorder  Upon further evaluation the diagnoses were given as follows:  Alcohol use disorder  Patient's psychiatric medications were adjusted on admission:  No changes, patient refused medication  During the hospitalization, other adjustments were made to the patient's psychiatric medication regimen:  Trial of Remeron 7.5 mg QHS for sleep (discontinued due to morning drowsiness)  Gradually, patient started adjusting to milieu.   Patient's care was discussed during the interdisciplinary team meeting every day during the hospitalization.  The patient *** having side effects to prescribed psychiatric medication.  The patient reports their target psychiatric symptoms of *** responded well to the psychiatric medications, and the patient reports overall benefit other psychiatric hospitalization. Supportive psychotherapy was provided to the patient. The patient also participated in regular group therapy while admitted.   Labs were reviewed with the patient, and abnormal results were discussed with the patient.  The patient denied having suicidal  thoughts more than 48 hours prior to discharge.  Patient denies having homicidal thoughts.  Patient denies having auditory hallucinations.  Patient denies any visual hallucinations.  Patient denies having paranoid thoughts.  The patient is able to verbalize their individual safety plan to this provider.  It is recommended to the patient to continue psychiatric medications as prescribed, after discharge from the hospital.    It is recommended to the patient to follow up with your outpatient psychiatric provider and PCP.  Discussed with the patient, the impact of alcohol, drugs, tobacco have been there overall psychiatric and medical wellbeing, and total abstinence from substance use was recommended the patient.   Total Time spent with patient: {Time; 15 min - 8 hours:17441}  Musculoskeletal: Strength & Muscle Tone: {desc; muscle tone:32375} Gait & Station: {PE GAIT ED OHYW:73710} Patient leans: {Patient Leans:21022755}  Psychiatric Specialty Exam  Presentation  General Appearance: Appropriate for Environment; Casual; Neat  Eye Contact:Fair  Speech:Clear and Coherent; Normal Rate  Speech Volume:Normal  Handedness:Right   Mood and Affect  Mood:Euthymic  Duration of Depression Symptoms: Greater than two weeks  Affect:Congruent   Thought Process  Thought Processes:Coherent  Descriptions of Associations:Intact  Orientation:Full (Time, Place and Person)  Thought Content:Logical  History of Schizophrenia/Schizoaffective disorder:No  Duration of Psychotic Symptoms:No data recorded Hallucinations:No data recorded Ideas of Reference:None  Suicidal Thoughts:No data recorded Homicidal Thoughts:No data recorded  Sensorium  Memory:Immediate Good; Recent Good; Remote Good  Judgment:Good  Insight:Good   Executive Functions  Concentration:Good  Attention Span:Good  Recall:Good  Fund of Knowledge:Good  Language:Good   Psychomotor Activity  Psychomotor  Activity:No data recorded  Assets  Assets:Communication Skills; Desire for Improvement; Leisure Time; Physical Health   Sleep  Sleep:No data recorded  Physical Exam: Physical Exam ROS Blood pressure 103/80, pulse (!) 102, temperature 98.2 F (36.8 C), temperature source Oral, resp. rate 16, height  5\' 8"  (1.727 m), weight 84.8 kg, SpO2 97 %. Body mass index is 28.43 kg/m.  Mental Status Per Nursing Assessment::   On Admission:  NA  Demographic Factors:  {Demographic Factors:20662}  Loss Factors: {Loss Factors:20659}  Historical Factors: {Historical Factors:20660}  Risk Reduction Factors:   {Risk Reduction Factors:20661}  Continued Clinical Symptoms:  {Clinical Factors:22706}  Cognitive Features That Contribute To Risk:  {chl bhh Cognitive Features:304700251}    Suicide Risk:  {BHH SUICIDE RISK:22704}   Follow-up Information     [facility] Follow up.   Why: Referral made Contact information: [address] [place] [address]         [facility] Follow up.   Specialty: Addiction Medicine Why: Referral made Contact information: [address] [place] [phone]         [facility] Follow up.   Why: Referral made Contact information: [address] [place] [phone]                 Plan Of Care/Follow-up recommendations:  {BHH DC FU RECOMMENDATIONS:22620}  [phone], MD 12/14/2021, 8:22 AM

## 2021-12-14 NOTE — BHH Suicide Risk Assessment (Addendum)
[facility] Discharge Suicide Risk Assessment   Principal Problem: Substance induced mood disorder [facility]) Discharge Diagnoses: Principal Problem:   Substance induced mood disorder (HCC) Active Problems:   Alcohol use disorder  Total Time Spent in Direct Patient Care:  I personally spent 35 minutes on the unit in direct patient care. The direct patient care time included face-to-face time with the patient, reviewing the patient's chart, communicating with other professionals, and coordinating care. Greater than 50% of this time was spent in counseling or coordinating care with the patient regarding goals of hospitalization, psycho-education, and discharge planning needs.  Subjective: Patient was seen on rounds with Automotive engineer. He denies SI, HI, AVH,paranoia or delusions. He denies current cravings or signs of withdrawal. He voices no physical complaints. He reports stable appetite and improved sleep. He was encouraged to abstain from alcohol and illicit drug use after discharge. He has declined bed at [facility] and understands he has been denied at [facility] due to his previous seizure history. Currently no SAIOP resources are available which were discussed. He was encouraged to attend AA and keep scheduled outpatient mental health follow up appointments after discharge. He is forward thinking and can articulate a safety and discharge plan. 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 He wishes to continue Neurontin and Seroquel and was advised that he will need metabolic lab, weight, EKG, AIMS, and CBC monitoring while on Seroquel. Slept 6.75 hours overnight.  Labs: Na+ today 135 and remainder of BMP WNL and Acute hepatitis panel and FT3 pending  Musculoskeletal: Strength & Muscle Tone: within normal limits Gait & Station: normal Patient leans:  N/A  Psychiatric Specialty Exam  Presentation  General Appearance: Appropriate for Environment; Casual; Neat  Eye Contact:Good  Speech:Clear and Coherent; Normal Rate  Speech Volume:Normal  Mood and Affect  Mood:Euthymic  Affect:Congruent  Thought Process  Thought Processes:logical, goal directed  Descriptions of Associations:Intact  Orientation:Full (Time, Place and Person)  Thought Content:Logical - denies AVH, paranoia, or delusions  History of Schizophrenia/Schizoaffective disorder:No  Hallucinations:Denied  Ideas of Reference:None  Suicidal Thoughts:Denied  Homicidal Thoughts:Denied  Sensorium  Memory:Immediate Good; Recent Good; Remote Good  Judgment:Fair  Insight:Fair   Executive Functions  Concentration:Good  Attention Span:Good  Recall:Good  Fund of Knowledge:Good  Language:Good   Psychomotor Activity  Psychomotor Activity:Normal  Assets  Assets:Communication Skills; Desire for Improvement; Leisure Time; Physical Health  Physical Exam: Physical Exam Vitals and nursing note reviewed.  Constitutional:      Appearance: Normal appearance.  HENT:     Head: Normocephalic.  Pulmonary:     Effort: Pulmonary effort is normal.  Neurological:     General: No focal deficit present.     Mental Status: He is alert.   Review of Systems  Respiratory:  Negative for shortness of breath.   Cardiovascular:  Negative for chest pain and palpitations.  Gastrointestinal:  Negative for constipation, diarrhea, nausea and vomiting.  Neurological:  Negative for headaches.  Blood pressure 107/86, pulse 91, temperature 97.7 F (36.5 C), resp. rate 19, height 5\' 8"  (1.727 m), weight 84.8 kg, SpO2 98 %. Body mass index is 28.43 kg/m.  Mental Status Per Nursing Assessment::   On Admission:  alcohol use and depression   Demographic Factors:  Male  Loss Factors: NA  Historical Factors: Substance abuse prior to admission, family h/o  addiction  Risk Reduction Factors:  Sense of responsibility to family, Employed, Living with another person, especially a relative, Positive social support, and Positive coping skills or problem solving skills  Continued Clinical Symptoms:  Alcohol/Substance Abuse/Dependencies More than one psychiatric diagnosis  Cognitive Features That Contribute To Risk:  Thought constriction (tunnel vision)    Suicide Risk:  Minimal: No identifiable suicidal ideation.  Patients presenting with no risk factors but with morbid ruminations; may be classified as minimal risk based on the severity of the depressive symptoms   Follow-up Information     [facility] Follow up.   Why: Referral made Contact information: [address] [place] [address] [phone]         [facility] Follow up.   Specialty: Addiction Medicine Why: Referral made Contact information: [address] [place] [address] [phone]         [facility] Follow up.   Why: Referral made Contact information: [address] [place] [address] [phone]         Doran Stabler, DO Follow up.   Specialty: Internal Medicine Why: You have an appointment for primary care services on Contact information: [address] [place] [address] [phone]                 Plan Of Care/Follow-up recommendations:  Activity:  as tolerated Diet:  heart healthy Other:  Patient was advised to abstain from alcohol and illicit drug use after discharge and to attend AA and keep scheduled outpatient mental health follow up appointments after discharge. He was advised he will need metabolic lab monitoring, weight monitoring, CBC, AIMS and EKG monitoring while on Seroquel. 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 He is advised to avoid large doses of Tylenol given his liver enzyme elevation. His acute hepatitis panel and Free T3 are pending at time of discharge.  Comer Locket, MD,FAPA 12/15/2021, 6:56 AM

## 2021-12-14 NOTE — Progress Notes (Signed)
Pt says he cannot sleep after sleep medicine. Provider notified. Given one-time dose of Doxepin. Pt encouraged to report any sleep disturbances after this medication given. Verbalized understanding.

## 2021-12-14 NOTE — Plan of Care (Signed)
Nurse discussed coping skills with patient.  

## 2021-12-14 NOTE — BHH Suicide Risk Assessment (Addendum)
BHH INPATIENT:  Family/Significant Other Suicide Prevention Education  Suicide Prevention Education:  Contact Attempts: [name] (wife/girlfriend) [phone], (name of family member/significant other) has been identified by the patient as the family member/significant other with whom the patient will be residing, and identified as the person(s) who will aid the patient in the event of a mental health crisis.  With written consent from the patient, two attempts were made to provide suicide prevention education, prior to and/or following the patient's discharge.  We were unsuccessful in providing suicide prevention education.  A suicide education pamphlet was given to the patient to share with family/significant other.  Date and time of first attempt:12/14/2021  /  2:40pm  CSW left a HIPAA compliant message for this pt.  Date and time of second attempt: 12/14/2021  /  4:50pm  CSW left a HIPAA compliant message for this pt.   Felizardo Hoffmann 12/14/2021, 2:40 PM

## 2021-12-14 NOTE — Progress Notes (Signed)
D:  Patient's self inventory sheet, patient has fair sleep, sleep medication helpful.  Good appetite, normal energy level, good concentration.  Denied depression, hopeless and anxiety.  Denied withdrawals. Denied SI.  Denied physical problems.  Denied physical pain.  Goal is "good day".  A:  Medications administered per MD orders.  Emotional support and encouragement given patient. R:  Denied SI and HI, contracts for safety.  Denied A/V hallucinations.  Safety maintained with 15 minute checks.  Stated he slept approximately 7 hrs last night.   Covid test sent to  [facility] lab.

## 2021-12-14 NOTE — Progress Notes (Addendum)
[facility] MD Progress Note  Patient Identification: Xavier Owens MRN: [id number] Date of Evaluation: 12/10/21 Chief Complaint: "Seizures" Principal Diagnosis: Major Depressive disorder, recurrent, severe Diagnosis: Alcohol Use Disorder   Subjective: Xavier Owens is a 51 year old male with PPHx of alcohol use disorder with Hx of seizures. He was transferred from [facility] ED to [facility], admitted voluntarily for suicidal thoughts without a plan.  The patient's chart was reviewed and nursing notes were reviewed. Over the past 24 hrs, there were no documented behavioral issues, no PRN medications given for agitation, and the patient was compliant with scheduled medications. The patient's case was discussed in multidisciplinary team meeting.   On interview and assessment this morning, the patient exhibits a linear, logical, and goal directed thought process with an appropriate affect.  He reports sleeping some better with the one time dose of doxepin last night.  We discussed that the team would prefer not to send him home on a TCA for sleep at discharge.  He is interested in trying a new medication for sleep.  He is amenable to a trial of Neurontin 300 mg tonight and Seroquel 25 mg as a backup. We discussed that Neurontin may help with cravings and given his reported seizure history would hopefully help mitigate seizure risk. He is frustrated that the rehab option for him is not located closer to home and has refused [facility] bed.  He is informed that [facility] is not an option for him.  He reports that he has $500,000 in the bank and is able to go to [facility], but when offered the option to call [facility] for an intake declines this.  The patient denies auditory/visual hallucinations and first rank symptoms.  He reports good mood, appetite. He denies suicidal and homicidal thoughts. He denies side effects from his medications.  Review of systems as below.  Diagnosis:  Principal  Problem:   Substance induced mood disorder (HCC) Active Problems:   Alcohol use disorder   Past Psychiatric History: See H&P  Past Medical History:  Past Medical History:  Diagnosis Date   Alcoholic ([place])    Hypertension    Seizures ([place])     Past Surgical History:  Procedure Laterality Date   dislocated hip     HAND SURGERY     KNEE SURGERY     Family History:  Family History  Problem Relation Age of Onset   Cancer Mother    Cancer Father    Family Psychiatric  History: See H&P  Social History:  Social History   Substance and Sexual Activity  Alcohol Use Yes   Comment: "a fifth of vodka every day for 30 years"     Social History   Substance and Sexual Activity  Drug Use No    Social History   Socioeconomic History   Marital status: Married    Spouse name: Not on file   Number of children: Not on file   Years of education: Not on file   Highest education level: Not on file  Occupational History   Not on file  Tobacco Use   Smoking status: Every Day    Packs/day: 0.10    Types: Cigarettes   Smokeless tobacco: Never  Vaping Use   Vaping Use: Never used  Substance and Sexual Activity   Alcohol use: Yes    Comment: "a fifth of vodka every day for 30 years"   Drug use: No   Sexual activity: Yes  Other Topics Concern  Not on file  Social History Narrative   Not on file   Social Determinants of Health   Financial Resource Strain: Not on file  Food Insecurity: Not on file  Transportation Needs: Not on file  Physical Activity: Not on file  Stress: Not on file  Social Connections: Not on file    Sleep: Per above  Appetite:  Per above  Current Medications: Current Facility-Administered Medications  Medication Dose Route Frequency Provider Last Rate Last Admin   acetaminophen (TYLENOL) tablet 650 mg  650 mg Oral Q6H PRN Starkes-Perry, Gayland Curry, FNP       alum & mag hydroxide-simeth (MAALOX/MYLANTA) 200-200-20 MG/5ML suspension 30 mL  30 mL  Oral Q4H PRN Starkes-Perry, Gayland Curry, FNP       gabapentin (NEURONTIN) tablet 300 mg  300 mg Oral QHS Corky Sox, MD       hydrOXYzine (ATARAX) tablet 50 mg  50 mg Oral QHS Corky Sox, MD   50 mg at 12/13/21 2109   magnesium hydroxide (MILK OF MAGNESIA) suspension 30 mL  30 mL Oral Daily PRN Suella Broad, FNP       multivitamin with minerals tablet 1 tablet  1 tablet Oral Daily Ajibola, Ene A, NP   1 tablet at 12/14/21 0844   QUEtiapine (SEROQUEL) tablet 25 mg  25 mg Oral QHS PRN Corky Sox, MD       thiamine (B-1) injection 100 mg  100 mg Intramuscular Once Ajibola, Ene A, NP       thiamine tablet 100 mg  100 mg Oral Daily Ajibola, Ene A, NP   100 mg at 12/14/21 [id number]    Lab Results:  Results for orders placed or performed during the hospital encounter of 12/08/21 (from the past 48 hour(s))  Resp Panel by RT-PCR (Flu A&B, Covid) Nasopharyngeal Swab     Status: None   Collection Time: 12/14/21 11:28 AM   Specimen: Nasopharyngeal Swab; Nasopharyngeal(NP) swabs in vial transport medium  Result Value Ref Range   SARS Coronavirus 2 by RT PCR NEGATIVE NEGATIVE    Comment: (NOTE) SARS-CoV-2 target nucleic acids are NOT DETECTED.  The SARS-CoV-2 RNA is generally detectable in upper respiratory specimens during the acute phase of infection. The lowest concentration of SARS-CoV-2 viral copies this assay can detect is 138 copies/mL. A negative result does not preclude SARS-Cov-2 infection and should not be used as the sole basis for treatment or other patient management decisions. A negative result may occur with  improper specimen collection/handling, submission of specimen other than nasopharyngeal swab, presence of viral mutation(s) within the areas targeted by this assay, and inadequate number of viral copies(<138 copies/mL). A negative result must be combined with clinical observations, patient history, and epidemiological information. The expected result is  Negative.  Fact Sheet for Patients:  EntrepreneurPulse.com.au  Fact Sheet for Healthcare Providers:  IncredibleEmployment.be  This test is no t yet approved or cleared by the [place] FDA and  has been authorized for detection and/or diagnosis of SARS-CoV-2 by FDA under an Emergency Use Authorization (EUA). This EUA will remain  in effect (meaning this test can be used) for the duration of the COVID-19 declaration under Section 564(b)(1) of the Act, 21 U.S.C.section 360bbb-3(b)(1), unless the authorization is terminated  or revoked sooner.       Influenza A by PCR NEGATIVE NEGATIVE   Influenza B by PCR NEGATIVE NEGATIVE    Comment: (NOTE) The Xpert Xpress SARS-CoV-2/FLU/RSV plus assay is intended as an aid in the  diagnosis of influenza from Nasopharyngeal swab specimens and should not be used as a sole basis for treatment. Nasal washings and aspirates are unacceptable for Xpert Xpress SARS-CoV-2/FLU/RSV testing.  Fact Sheet for Patients: EntrepreneurPulse.com.au  Fact Sheet for Healthcare Providers: IncredibleEmployment.be  This test is not yet approved or cleared by the [place] FDA and has been authorized for detection and/or diagnosis of SARS-CoV-2 by FDA under an Emergency Use Authorization (EUA). This EUA will remain in effect (meaning this test can be used) for the duration of the COVID-19 declaration under Section 564(b)(1) of the Act, 21 U.S.C. section 360bbb-3(b)(1), unless the authorization is terminated or revoked.  Performed at [facility], [place] [address], [place], [place] [address]      Blood Alcohol level:  Lab Results  Component Value Date   ETH 79 (H) 12/07/2021   ETH <10 [id number]    Metabolic Disorder Labs: Lab Results  Component Value Date   HGBA1C 5.0 12/09/2021   MPG 97 12/09/2021   No results found for: PROLACTIN Lab Results   Component Value Date   CHOL 179 12/09/2021   TRIG 90 12/09/2021   HDL 44 12/09/2021   CHOLHDL 4.1 12/09/2021   VLDL 18 12/09/2021   LDLCALC 117 (H) 12/09/2021    Physical Findings: CIWA:  CIWA-Ar Total: 1 Psychiatric Specialty Exam: Physical Exam Constitutional:      General: He is not in acute distress.    Appearance: He is not toxic-appearing.  Pulmonary:     Effort: No respiratory distress.  Psychiatric:        Mood and Affect: Mood normal.        Behavior: Behavior normal.        Thought Content: Thought content normal.    Review of Systems  Eyes:  Negative for visual disturbance.  Respiratory:  Negative for shortness of breath.   Cardiovascular:  Negative for chest pain.  Gastrointestinal:  Negative for abdominal pain, constipation, diarrhea, nausea and vomiting.  Neurological:  Negative for tremors, seizures and headaches.  Psychiatric/Behavioral:  Negative for hallucinations and suicidal ideas.    Blood pressure 121/88, pulse 84, temperature 98.5 F (36.9 C), temperature source Oral, resp. rate 16, height 5\' 8"  (1.727 m), weight 84.8 kg, SpO2 97 %.Body mass index is 28.43 kg/m.  General Appearance: casually dressed, fair hygiene  Eye Contact:  Good  Speech:  Normal Rate  Volume:  Normal  Mood:  "Good" - appears euthymic  Affect:  Full Range, stable, moderate  Thought Process:  Goal Directed and Linear  Orientation:  Grossly oriented to person place and time  Thought Content:  Patient denied SI/HI/AVH, delusions, paranoia, first rank symptoms. Patient is not grossly responding to internal/external stimuli on exam and did not make delusional statements.   Suicidal Thoughts:  No  Homicidal Thoughts:  No  Memory:  Immediate: fair, Recent: fair, Remote: fair  Judgement:  Poor  Insight:  shallow  Psychomotor Activity:  Normal  Concentration: fair  Recall:  fair  Fund of Knowledge:  fair  Language:  fair  Akathisia:  NA  Handed:  unknown  AIMS (if indicated):   NA  Assets:  Desire for Improvement Social Support  ADL's:  Intact  Cognition:  WNL  Sleep:  Number of Hours: 5.75   Treatment Plan & Assessment Summary: Principal Problem:   Substance induced mood disorder ([place]) Active Problems:   Alcohol use disorder  Physician Treatment Plan for Primary Diagnosis:  Long Term Goal(s): Improvement in symptoms so  as ready for discharge   Short Term Goals: Ability to maintain clinical measurements within normal limits will improve and Compliance with prescribed medications will improve   Safety and Monitoring -- Voluntary admission to inpatient psychiatric unit for safety, stabilization and treatment -- Daily contact with patient to assess and evaluate symptoms and progress in treatment -- Patient's case to be discussed in multi-disciplinary team meeting -- Observation Level : q15 minute checks -- Vital signs:  q12 hours -- Precautions: suicide -- Patient reports he has several guns in his house that will need to be secured - social work has been attempting to reach spouse to get these secured before discharge.    Alcohol use d/o  Substance induced depressive d/o - Start Neurontin 300 mg QHS for sleep (will also potentially reduce risk of seizures and help with cravings) r/b/se/a to med reviewed and he consents to trial - Seroquel 25 mg PRN for insomnia if Neurontin is ineffective - r/b/se/a to med discussed and he consents to trial - continue daily thiamine po - Last drink approximately 96 hrs previously, CIWA of 1 recently  - CIWA protocols in place with PRN Lorazepam for score >10 - Sz precautions - Motivational interviewing - Patient does not have any residential rehab options at this point after declining [facility] bed and [facility] is not available for uninsured patients in the area presently. He will be offered AA meeting information and outpatient mental health f/u information at discharge.   r/o Hypothyroidism  - TSH 5.2 on (1/26); Repeat TSH  was 6.379;  -T4 nml, T3 pending - will need PCP f/u after discharge  Mild Hyponatremia - Repeat BMP pending  Elevated AST - Acute hepatitis panel pending and counseled on need to abstain from ETOH   Medical Management Covid: negative  CMP: (1/24 and 1/26) K 3.1 -> 3.6, Na+ 133; AST 101-> 70, Total Protein 8.4->8.7  -Repeat CMP for trending of Na+  -Hepatitis panel given LFT elevation Mg: 1.6 -> 1.9 (1/24) Acetaminophen: [id number] Salicylate: <7 CBC: wnl  EtOH: 79 on 1/24 UDS: negative (benzodiazepines given in ED) (1/24) TSH: 5.277-> 6.379 A1C: 5.0 Lipids: LDL 117 otw wnl (1/26) EKG: 451 QTcb   Corky Sox, MD PGY-1

## 2021-12-14 NOTE — BHH Counselor (Addendum)
CSW contacted Danie Chandler with [facility] who stated that pt has a bed for residential treatment for 12/14/21 at [place] notified MD and MD stated due to staffing and late notice pt would not be ready in time. CSW contacted Ms. Seamore again to see if pt could have his admission moved. Ms. Mohammed Kindle stated that pt could be admitted 12/15/21 at 10am as long as he has a negative COVID test. CSW received voicemail from Presquille B at [facility] stating that pt has been denied due to recent seizure history. CSW notified MD.  11:20am: CSW met with pt and informed him of his admission to [facility] for 12/15/21. Pt stated he does not want to go there because it is too far. Pt states he only wants to go to [facility]. CSW shared with this pt that during his social work assessment that he stated he wanted a referral to [place]. CSW also explained that pt does not qualify for [facility] as he has no insurance. CSW notified MD Alvie Heidelberg of pt's decision.   Toney Reil, Iron Worker [employer]

## 2021-12-15 ENCOUNTER — Encounter (HOSPITAL_COMMUNITY): Payer: Self-pay

## 2021-12-15 ENCOUNTER — Telehealth: Payer: Self-pay

## 2021-12-15 DIAGNOSIS — F1020 Alcohol dependence, uncomplicated: Secondary | ICD-10-CM

## 2021-12-15 LAB — BASIC METABOLIC PANEL
Anion gap: 8 (ref 5–15)
BUN: 9 mg/dL (ref 6–20)
CO2: 25 mmol/L (ref 22–32)
Calcium: 9.8 mg/dL (ref 8.9–10.3)
Chloride: 102 mmol/L (ref 98–111)
Creatinine, Ser: 0.78 mg/dL (ref 0.61–1.24)
GFR, Estimated: >60 mL/min (ref >60)
Glucose, Bld: 90 mg/dL (ref 70–99)
Potassium: 4 mmol/L (ref 3.5–5.1)
Sodium: 135 mmol/L (ref 135–145)

## 2021-12-15 LAB — HEPATITIS PANEL, ACUTE
HCV Ab: NONREACTIVE
Hep A IgM: NONREACTIVE
Hep B C IgM: NONREACTIVE
Hepatitis B Surface Ag: NONREACTIVE

## 2021-12-15 MED ORDER — GABAPENTIN 300 MG PO CAPS
300.0000 mg | ORAL_CAPSULE | Freq: Every day | ORAL | Status: DC
  Filled 2021-12-15 (×2): qty 1
  Filled 2021-12-15: qty 21

## 2021-12-15 MED ORDER — GABAPENTIN 300 MG PO CAPS: 300.0000 mg | ORAL_CAPSULE | Freq: Every day | ORAL | 0 refills | Status: DC

## 2021-12-15 MED ORDER — QUETIAPINE FUMARATE 25 MG PO TABS: 25.0000 mg | ORAL_TABLET | Freq: Every evening | ORAL | 0 refills | Status: DC | PRN

## 2021-12-15 NOTE — Group Note (Signed)
[facility] LCSW Group Therapy Note   Group Date: 12/15/2021 Start Time: 1300 End Time: 1400   Type of Therapy/Topic:  Group Therapy:  Emotion Regulation  Participation Level:  Active   Mood: Good  Description of Group:    The purpose of this group is to assist patients in learning to regulate negative emotions and experience positive emotions. Patients will be guided to discuss ways in which they have been vulnerable to their negative emotions. These vulnerabilities will be juxtaposed with experiences of positive emotions or situations, and patients challenged to use positive emotions to combat negative ones. Special emphasis will be placed on coping with negative emotions in conflict situations, and patients will process healthy conflict resolution skills.  Therapeutic Goals: Patient will identify two positive emotions or experiences to reflect on in order to balance out negative emotions:  Patient will label two or more emotions that they find the most difficult to experience:  Patient will be able to demonstrate positive conflict resolution skills through discussion or role plays:   Summary of Patient Progress:   Pt came to group and was appropriate. Pt shared during introductions his name and that he is currently feeling "good".    Therapeutic Modalities:   Cognitive Behavioral Therapy Feelings Identification Dialectical Behavioral Therapy   Felizardo Hoffmann, LCSWA

## 2021-12-15 NOTE — BH IP Treatment Plan (Signed)
Interdisciplinary Treatment and Diagnostic Plan Update  12/15/2021 Time of Session: 9:50am  Xavier Owens MRN: [id number]  Principal Diagnosis: Substance induced mood disorder (HCC)  Secondary Diagnoses: Principal Problem:   Substance induced mood disorder (HCC) Active Problems:   Alcohol use disorder   Current Medications:  Current Facility-Administered Medications  Medication Dose Route Frequency Provider Last Rate Last Admin   acetaminophen (TYLENOL) tablet 650 mg  650 mg Oral Q6H PRN Starkes-Perry, Juel Burrow, FNP       alum & mag hydroxide-simeth (MAALOX/MYLANTA) 200-200-20 MG/5ML suspension 30 mL  30 mL Oral Q4H PRN Starkes-Perry, Juel Burrow, FNP       gabapentin (NEURONTIN) capsule 300 mg  300 mg Oral QHS Hill, Shelbie Hutching, MD       hydrOXYzine (ATARAX) tablet 50 mg  50 mg Oral QHS Carlyn Reichert, MD   50 mg at 12/14/21 2107   magnesium hydroxide (MILK OF MAGNESIA) suspension 30 mL  30 mL Oral Daily PRN Maryagnes Amos, FNP       multivitamin with minerals tablet 1 tablet  1 tablet Oral Daily Ajibola, Ene A, NP   1 tablet at 12/15/21 0816   QUEtiapine (SEROQUEL) tablet 25 mg  25 mg Oral QHS PRN Carlyn Reichert, MD   25 mg at 12/14/21 2316   thiamine tablet 100 mg  100 mg Oral Daily Ajibola, Ene A, NP   100 mg at 12/15/21 0816   PTA Medications: Medications Prior to Admission  Medication Sig Dispense Refill Last Dose   chlordiazePOXIDE (LIBRIUM) 25 MG capsule 50mg  PO TID x 1D, then 25-50mg  PO BID X 1D, then 25-50mg  PO QD X 1D (Patient not taking: Reported on 12/07/2021) 15 capsule 0     Patient Stressors: Substance abuse    Patient Strengths: Capable of independent living  12/09/2021  Motivation for treatment/growth  Supportive family/friends  Work skills   Treatment Modalities: Medication Management, Group therapy, Case management,  1 to 1 session with clinician, Psychoeducation, Recreational therapy.   Physician Treatment Plan for Primary Diagnosis:  Substance induced mood disorder (HCC) Long Term Goal(s):     Short Term Goals:    Medication Management: Evaluate patient's response, side effects, and tolerance of medication regimen.  Therapeutic Interventions: 1 to 1 sessions, Unit Group sessions and Medication administration.  Evaluation of Outcomes: Adequate for Discharge  Physician Treatment Plan for Secondary Diagnosis: Principal Problem:   Substance induced mood disorder (HCC) Active Problems:   Alcohol use disorder  Long Term Goal(s):     Short Term Goals:       Medication Management: Evaluate patient's response, side effects, and tolerance of medication regimen.  Therapeutic Interventions: 1 to 1 sessions, Unit Group sessions and Medication administration.  Evaluation of Outcomes: Adequate for Discharge   RN Treatment Plan for Primary Diagnosis: Substance induced mood disorder (HCC) Long Term Goal(s): Knowledge of disease and therapeutic regimen to maintain health will improve  Short Term Goals: Ability to remain free from injury will improve, Ability to participate in decision making will improve, Ability to verbalize feelings will improve, Ability to disclose and discuss suicidal ideas, and Ability to identify and develop effective coping behaviors will improve  Medication Management: RN will administer medications as ordered by provider, will assess and evaluate patient's response and provide education to patient for prescribed medication. RN will report any adverse and/or side effects to prescribing provider.  Therapeutic Interventions: 1 on 1 counseling sessions, Psychoeducation, Medication administration, Evaluate responses to treatment, Monitor vital signs and CBGs  as ordered, Perform/monitor CIWA, COWS, AIMS and Fall Risk screenings as ordered, Perform wound care treatments as ordered.  Evaluation of Outcomes: Adequate for Discharge   LCSW Treatment Plan for Primary Diagnosis: Substance induced mood disorder  (HCC) Long Term Goal(s): Safe transition to appropriate next level of care at discharge, Engage patient in therapeutic group addressing interpersonal concerns.  Short Term Goals: Engage patient in aftercare planning with referrals and resources, Increase social support, Increase emotional regulation, Facilitate acceptance of mental health diagnosis and concerns, Identify triggers associated with mental health/substance abuse issues, and Increase skills for wellness and recovery  Therapeutic Interventions: Assess for all discharge needs, 1 to 1 time with Social worker, Explore available resources and support systems, Assess for adequacy in community support network, Educate family and significant other(s) on suicide prevention, Complete Psychosocial Assessment, Interpersonal group therapy.  Evaluation of Outcomes: Adequate for Discharge   Progress in Treatment: Attending groups: Yes. Participating in groups: Yes. Taking medication as prescribed: Yes. Toleration medication: Yes. Family/Significant other contact made: No, will contact:  girlfriend/wife Patient understands diagnosis: Yes. Discussing patient identified problems/goals with staff: Yes. Medical problems stabilized or resolved: Yes. Denies suicidal/homicidal ideation: Yes. Issues/concerns per patient self-inventory: No. Other: None   New problem(s) identified: No, Describe:  None   New Short Term/Long Term Goal(s):medication stabilization, elimination of SI thoughts, development of comprehensive mental wellness plan.    Patient Goals:  "alcoholism"   Discharge Plan or Barriers: Patient recently admitted. Pt discharged home and will follow up with a local mental health provider for therapy and medication management    Reason for Continuation of Hospitalization: Medication stabilization    Estimated Length of Stay: Adequate for Discharge    Scribe for Treatment Team: Catha Brow 12/15/2021 3:10 PM

## 2021-12-15 NOTE — Progress Notes (Signed)
°   12/14/21 2045  Psych Admission Type (Psych Patients Only)  Admission Status Voluntary  Psychosocial Assessment  Patient Complaints None  Eye Contact Fair  Facial Expression Animated  Affect Appropriate to circumstance  Speech Logical/coherent  Interaction Assertive  Motor Activity Other (Comment) (wnl)  Appearance/Hygiene Unremarkable  Behavior Characteristics Cooperative;Appropriate to situation  Mood Pleasant  Thought Process  Coherency WDL  Content WDL  Delusions None reported or observed  Perception WDL  Hallucination None reported or observed  Judgment Impaired  Confusion None  Danger to Self  Current suicidal ideation? Denies  Danger to Others  Danger to Others None reported or observed   Pt seen in dayroom active in milieu.  Pt denies SI, HI, AVH and pain. Pt denies anxiety and depression. Pt used earplugs last night to try and sleep because his roommate was snoring. Gabapentin added to sleep regimen tonight. Discussed medication with patient. "The doctor said I could get Seroquel for sleep." Explained to pt that this medication would be given after a reasonable amount of time if gabapentin did not work to promote sleep. Pt verbalized understanding.

## 2021-12-15 NOTE — [facility] Suicide Risk Assessment (Signed)
[facility] INPATIENT:  Family/Significant Other Suicide Prevention Education  Suicide Prevention Education:  Education Completed; Chalmers Iddings (wife) [phone],  (name of family member/significant other) has been identified by the patient as the family member/significant other with whom the patient will be residing, and identified as the person(s) who will aid the patient in the event of a mental health crisis (suicidal ideations/suicide attempt).  With written consent from the patient, the family member/significant other has been provided the following suicide prevention education, prior to the and/or following the discharge of the patient.  The suicide prevention education provided includes the following: Suicide risk factors Suicide prevention and interventions National Suicide Hotline telephone number [facility] assessment telephone number [facility] Emergency Assistance 911 [facility] and/or Residential Mobile Crisis Unit telephone number  Request made of family/significant other to: Remove weapons (e.g., guns, rifles, knives), all items previously/currently identified as safety concern.   Remove drugs/medications (over-the-counter, prescriptions, illicit drugs), all items previously/currently identified as a safety concern.  The family member/significant other verbalizes understanding of the suicide prevention education information provided.  The family member/significant other agrees to remove the items of safety concern listed above.  "He was admitted because of his alcoholism. It had gotten really bad. I don't know how he lived. We live together and he can come back home at discharge even though I wish he would go to treatment.". CSW discussed pt's f/u and discussed that pt had a treatment bed at [facility] but he declined it due to it being too far. "He is not being cooperative so what can you do.". Guns have been secured at aunt's home. No safety concerns.    Felizardo Hoffmann 12/15/2021, 11:38 AM

## 2021-12-15 NOTE — Plan of Care (Signed)
Nurse discussed coping skills with patient.  

## 2021-12-15 NOTE — Progress Notes (Signed)
Discharge Note:  Patient discharged home with family member.  Suicide prevention information given and discussed with patient who stated he understood and had no questions.  Denied SI and HI.  Denied A/V hallucinations.  Patient stated he received all his belongings, clothing, toiletries, misc items, etc.  Patient stated he appreciated all assistance from [facility] staff.  All required discharge information given.

## 2021-12-15 NOTE — [facility] Group Notes (Signed)
Pt attended group and contributed 

## 2021-12-15 NOTE — Telephone Encounter (Signed)
Social worker Company secretary ) called requesting a hospital f/u for pt with in the next 3 days was given 2/7 @ 3:15 with Dr Marva Panda

## 2021-12-15 NOTE — Progress Notes (Signed)
Pt seen at nurse's station. Cannot get to sleep. PRN will be administered.
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
## 2021-12-15 NOTE — Plan of Care (Addendum)
Patient went to lunch and is excited to leave today.

## 2021-12-15 NOTE — Progress Notes (Signed)
D:  Patient's self inventory sheet, patient sleeps good, sleep medication helpful.  Good appetite,  normal energy level, good concentration.  Rated depression, hopeless, anxiety #10.  Denied withdrawals.  Denied SI.  Denied physical problems.  Rated pain #1 in past 24 hours.  Goal is discharge and make deposits.  Plans to make bank deposits.  Does have discharge plans. A:  Medications administered per MD orders.  Emotional support and encouragement given patient. R:  Denied SI and HI, contracts for safety.  Denied A/V hallucinations.  Safety maintained with 15 minute checks.

## 2021-12-15 NOTE — Group Note (Signed)
Recreation Therapy Group Note   Group Topic:Stress Management  Group Date: 12/15/2021 Start Time: 0935 End Time: 0950 Facilitators: Caroll Rancher, [place] Location: [address] Dayroom   Goal Area(s) Addresses:  Patient will identify positive stress management techniques. Patient will identify benefits of using stress management post d/c.  Group Description:  Meditation.  LRT played a meditation that focused on being resilient.  Patients were to focus and be attentive to the meditation as it played.  Patients were to also focus on their breathing and allow it to relax them as much as possible.   Affect/Mood: Appropriate   Participation Level: Active   Participation Quality: Independent   Behavior: Appropriate   Speech/Thought Process: Focused   Insight: Good   Judgement: Good   Modes of Intervention: Meditation   Patient Response to Interventions:  Attentive   Education Outcome:  Acknowledges education and In group clarification offered    Clinical Observations/Individualized Feedback: Pt attended and participated in group.   Plan: Continue to engage patient in RT group sessions 2-3x/week.   Caroll Rancher, LRT,CTRS  12/15/2021 11:37 AM

## 2021-12-15 NOTE — Progress Notes (Signed)
°  [facility] Adult Case Management Discharge Plan :  Will you be returning to the same living situation after discharge:  Yes,  personal home At discharge, do you have transportation home?: Yes,  father Do you have the ability to pay for your medications: Yes,  employment  Release of information consent forms completed and sent to medical records;  Patient's signature obtained.   Patient to Follow up at:  Follow-up Information     [facility] Follow up.   Why: You have been accepted to this facility for substance use treatment. You may call to reschedule your admission if you decide to receive treatment at this facility. Contact information: [address] [place] [address] [phone]         Doran Stabler, DO Follow up.   Specialty: Internal Medicine Why: You have an appointment for primary care services on 12/21/2021 at 3:15pm. Contact information: [address] [place] [address] [phone]         [facility] Follow up.   Specialty: Behavioral Health Why: You may go to this facility Monday- Wednesday 7:30am-11am for walk-in appointments for medication management and therapy services. Contact information: [address] [place] [address] [phone]                Next level of care provider has access to [facility] Link:yes  Safety Planning and Suicide Prevention discussed: Yes,  wife/girlfriend     Has patient been referred to the Quitline?: Patient refused referral  Patient has been referred for addiction treatment: Yes  Felizardo Hoffmann, LCSWA 12/15/2021, 11:31 AM

## 2021-12-16 LAB — T3, FREE

## 2021-12-21 ENCOUNTER — Other Ambulatory Visit: Payer: Self-pay

## 2021-12-21 ENCOUNTER — Ambulatory Visit (INDEPENDENT_AMBULATORY_CARE_PROVIDER_SITE_OTHER): Payer: No Typology Code available for payment source | Admitting: Internal Medicine

## 2021-12-21 ENCOUNTER — Other Ambulatory Visit (HOSPITAL_COMMUNITY): Payer: Self-pay

## 2021-12-21 VITALS — BP 117/71 | HR 95 | Temp 98.3°F | Wt 190.3 lb

## 2021-12-21 DIAGNOSIS — I10 Essential (primary) hypertension: Secondary | ICD-10-CM

## 2021-12-21 DIAGNOSIS — F1020 Alcohol dependence, uncomplicated: Secondary | ICD-10-CM

## 2021-12-21 DIAGNOSIS — F1994 Other psychoactive substance use, unspecified with psychoactive substance-induced mood disorder: Secondary | ICD-10-CM

## 2021-12-21 DIAGNOSIS — F515 Nightmare disorder: Secondary | ICD-10-CM | POA: Insufficient documentation

## 2021-12-21 DIAGNOSIS — E038 Other specified hypothyroidism: Secondary | ICD-10-CM

## 2021-12-21 MED ORDER — NALTREXONE HCL 50 MG PO TABS
50.0000 mg | ORAL_TABLET | Freq: Every day | ORAL | 0 refills | Status: DC
  Filled 2021-12-21: qty 30, 30d supply, fill #0

## 2021-12-21 NOTE — Assessment & Plan Note (Signed)
BP 117/71 today with no anti-hypertensive medications. No medications needed at this time.  -  No medications needed at this time.

## 2021-12-21 NOTE — Assessment & Plan Note (Signed)
Reports nightmares associated with time [place] [employer] that affect his ability to sleep. He has been taking seroquel 150mg  nightly for sleep despite being prescribed 25mg , but mentions there have been no nightmares since hospital discharge. Discussed potential severe side effects of Seroquel and encouraged him to stick to 25mg  nightly, with close 1 month f/u to re-assess sleep. Counseled him that there are other potential options to help with nightmares that can be discussed at next visit. He does not want to meet with therapy as he feels this would only make nightmares worse.  - 25mg  Seroquel nightly - 1 month f/u - Can discuss prazosin for nightmares if present/worsened at next visit.

## 2021-12-21 NOTE — Assessment & Plan Note (Signed)
TSH on discharge was 6.38, though T4 normal at 0.91. Most likely represents subclinical hypothyroidism, and TSH should be rechecked annually.  - Annual TSH.

## 2021-12-21 NOTE — Assessment & Plan Note (Addendum)
Extensive history of alcohol use disorder, most recently hospitalized for alcohol use disorder/substance-induced mood disorder on 12/07/21. He reports no recent alcohol use as he has been "staying busy around the house." He was previously on naltrexone in the past, but was not discharged on the medication and it is unclear why. He reports his daughter who lives in [place] is working on finding him some outpatient resources for alcohol use disorder. Today he is agreeable to restarting naltrexone. He reports no recent opioid use and says the last time he used opioids he was 15.  - Re-start naltrexone 50mg  daily.

## 2021-12-21 NOTE — Progress Notes (Signed)
°  Subjective:     Patient ID: Xavier Owens, male   DOB: 02-17-1971, 51 y.o.   MRN: [id number]  Xavier Owens is a 51 y/o seen in clinic today for hospital follow-up after being admitted for alcohol use disorder/substance-induced mood disorder on 12/08/21. Today he reports no alcohol use since his hospitalization. He reports "staying busy around the house" where he lives with his wife on 100 acres of land. He says his daughter who lives in [place] and is studying to be a gynecologist is working on getting him set up with outpatient resources for alcohol use disorder. He reports no N/V, diarrhea, or constipation. He reports no recent seizures, or seizure-like activity, and no AVH.   Review of Systems  Eyes:  Negative for visual disturbance.  Gastrointestinal:  Negative for constipation, diarrhea, nausea and vomiting.  Neurological:  Negative for seizures.  Psychiatric/Behavioral:  Positive for sleep disturbance. Negative for hallucinations.       Objective:   Physical Exam Constitutional:      Appearance: Normal appearance. He is not toxic-appearing.  HENT:     Head: Normocephalic.  Cardiovascular:     Rate and Rhythm: Normal rate and regular rhythm.     Heart sounds: Normal heart sounds.  Pulmonary:     Effort: Pulmonary effort is normal. No respiratory distress.     Breath sounds: Normal breath sounds. No wheezing.  Abdominal:     General: Abdomen is flat. Bowel sounds are decreased. There is no distension.     Palpations: Abdomen is soft.     Tenderness: There is no abdominal tenderness.  Musculoskeletal:     Right lower leg: No edema.     Left lower leg: No edema.  Skin:    Coloration: Skin is not jaundiced.  Neurological:     Mental Status: He is alert.  Psychiatric:        Mood and Affect: Mood normal.      Assessment & Plan:     See problem-based charting.

## 2021-12-21 NOTE — Patient Instructions (Signed)
Thank you, [name] for allowing [place] to provide your care today. Today we discussed:  Seroquel Use: - Continue to only take 25mg  at night. Seroquel at higher doses can have severe and permanent side effects that will not resolve even if the medication is stopped. We will follow up with you in 1 month and if you are continuing to experience issues sleeping, we can discuss other options.  Naltrexone: - We will restart naltrexone today. This medication will help to reduce cravings. It can cause suicidal thoughts or depressive episodes, so please call or seek emergency care if you experience either of these.    I have ordered the following medication/changed the following medications:   Stop the following medications: There are no discontinued medications.   Start the following medications: No orders of the defined types were placed in this encounter.    Follow up:  1 month    Remember:  Should you have any questions or concerns please call the internal medicine clinic at [phone].   [facility]

## 2021-12-21 NOTE — Assessment & Plan Note (Signed)
Reports no SI, HI, or AVH today. Describes mood as "good" and says he is staying busy around the house, which [place] land. He was discharged from hospital 1/25 on Seroquel 25mg  to help with sleep, and reports he is now taking 150mg , as lower doses were not effective in helping him sleep. Counseled him that this represents a significant increase from his prescribed dose, and that taking this dose carries serious potential side effects, including dyslipidemia, weight gain, and permanent extrapyramidal syndromes. He expressed concern about being able to sleep with his history of nightmares. With shared decision-making, reached an agreement to only use 25mg  nightly for sleep and to follow-up in 1 month for re-assessment. Instructed him that there are potential options to help with nightmares should these be a concern at the 1 month f/u visit, though he does not want to meet with therapy as he feels this would only make his nightmares worse.  - Seroquel 25mg  nightly for sleep - Continue gabapentin 300mg  nightly - 1 month f/u - Consider prazosin for nightmares if still prominent at 1 month f/u

## 2021-12-22 NOTE — Progress Notes (Signed)
Attestation for Student Documentation:  I personally was present and performed or re-performed the history, physical exam and medical decision-making activities of this service and have verified that the service and findings are accurately documented in the students note.  Xavier Owens is a 50 year old male with history of alcohol use disorder with recent admission to inpatient psychiatry for substance induced mood disorder presenting for hospital follow up. Patient reports doing well since discharge and has not been drinking any alcohol. Patient and family are searching for outpatient rehab programs at this time.Patient previously on naltrexone and tolerated this well; however, not currently taking this. He is agreeable to restarting for his alcohol use disorder. Additionally, reports taking gabapentin as prescribed but endorses excessive seroquel use, up to 150mg  nightly, to aid with sleep. Patient counseled on adhering to the dosage of seroquel prescribed at discharge to avoid EPS. He expresses understanding. He does note significant history of PTSD for which he has previously seen Dr Theodis Shove but he is not interested in continuing talk therapy at this time. Will follow up in 1 month. Patient also noted to have elevated TSH with nl free T4. He is asymptomatic at this time, consistent with subclinical hypothyroidism. Will continue to monitor with annual thyroid studies at this time.   Harvie Heck, MD IMTS PGY-3 12/22/2021, 8:14 AM

## 2021-12-22 NOTE — Progress Notes (Signed)
Internal Medicine Clinic Attending ° °Case discussed with Dr. Aslam  At the time of the visit.  We reviewed the resident’s history and exam and pertinent patient test results.  I agree with the assessment, diagnosis, and plan of care documented in the resident’s note.  °

## 2021-12-29 ENCOUNTER — Other Ambulatory Visit (HOSPITAL_COMMUNITY): Payer: Self-pay

## 2022-05-20 ENCOUNTER — Emergency Department (HOSPITAL_COMMUNITY)
Admission: EM | Admit: 2022-05-20 | Discharge: 2022-05-21 | Disposition: A | Discharge status: Home / Self Care | Payer: Commercial Managed Care - HMO | Attending: Emergency Medicine | Admitting: Emergency Medicine

## 2022-05-20 ENCOUNTER — Encounter (HOSPITAL_COMMUNITY): Payer: Self-pay

## 2022-05-20 ENCOUNTER — Other Ambulatory Visit: Payer: Self-pay

## 2022-05-20 ENCOUNTER — Emergency Department (HOSPITAL_COMMUNITY): Payer: Commercial Managed Care - HMO

## 2022-05-20 DIAGNOSIS — Z8673 Personal history of transient ischemic attack (TIA), and cerebral infarction without residual deficits: Secondary | ICD-10-CM | POA: Diagnosis not present

## 2022-05-20 DIAGNOSIS — Y908 Blood alcohol level of 240 mg/100 ml or more: Secondary | ICD-10-CM | POA: Insufficient documentation

## 2022-05-20 DIAGNOSIS — F10920 Alcohol use, unspecified with intoxication, uncomplicated: Secondary | ICD-10-CM

## 2022-05-20 DIAGNOSIS — R200 Anesthesia of skin: Secondary | ICD-10-CM | POA: Insufficient documentation

## 2022-05-20 DIAGNOSIS — F10129 Alcohol abuse with intoxication, unspecified: Secondary | ICD-10-CM | POA: Insufficient documentation

## 2022-05-20 DIAGNOSIS — R4182 Altered mental status, unspecified: Secondary | ICD-10-CM | POA: Diagnosis present

## 2022-05-20 DIAGNOSIS — H538 Other visual disturbances: Secondary | ICD-10-CM | POA: Insufficient documentation

## 2022-05-20 DIAGNOSIS — R479 Unspecified speech disturbances: Secondary | ICD-10-CM | POA: Diagnosis not present

## 2022-05-20 DIAGNOSIS — R000 Tachycardia, unspecified: Secondary | ICD-10-CM | POA: Diagnosis not present

## 2022-05-20 LAB — COMPREHENSIVE METABOLIC PANEL
ALT: 29 U/L (ref 0–44)
AST: 73 U/L — ABNORMAL HIGH (ref 15–41)
Albumin: 4.5 g/dL (ref 3.5–5.0)
Alkaline Phosphatase: 122 U/L (ref 38–126)
Anion gap: 20 — ABNORMAL HIGH (ref 5–15)
BUN: 11 mg/dL (ref 6–20)
CO2: 19 mmol/L — ABNORMAL LOW (ref 22–32)
Calcium: 8.9 mg/dL (ref 8.9–10.3)
Chloride: 102 mmol/L (ref 98–111)
Creatinine, Ser: 0.67 mg/dL (ref 0.61–1.24)
GFR, Estimated: >60 mL/min (ref >60)
Glucose, Bld: 82 mg/dL (ref 70–99)
Potassium: 3.4 mmol/L — ABNORMAL LOW (ref 3.5–5.1)
Sodium: 141 mmol/L (ref 135–145)
Total Bilirubin: 0.8 mg/dL (ref 0.3–1.2)
Total Protein: 9.4 g/dL — ABNORMAL HIGH (ref 6.5–8.1)

## 2022-05-20 LAB — I-STAT CHEM 8, ED
BUN: 8 mg/dL (ref 6–20)
Calcium, Ion: 1 mmol/L — ABNORMAL LOW (ref 1.15–1.40)
Chloride: 102 mmol/L (ref 98–111)
Creatinine, Ser: 1.1 mg/dL (ref 0.61–1.24)
Glucose, Bld: 78 mg/dL (ref 70–99)
HCT: 38 % — ABNORMAL LOW (ref 39.0–52.0)
Hemoglobin: 12.9 g/dL — ABNORMAL LOW (ref 13.0–17.0)
Potassium: 3.2 mmol/L — ABNORMAL LOW (ref 3.5–5.1)
Sodium: 141 mmol/L (ref 135–145)
TCO2: 20 mmol/L — ABNORMAL LOW (ref 22–32)

## 2022-05-20 LAB — DIFFERENTIAL
Abs Immature Granulocytes: 0.01 10*3/uL (ref 0.00–0.07)
Basophils Absolute: 0.1 10*3/uL (ref 0.0–0.1)
Basophils Relative: 1 %
Eosinophils Absolute: 0.1 10*3/uL (ref 0.0–0.5)
Eosinophils Relative: 1 %
Immature Granulocytes: 0 %
Lymphocytes Relative: 31 %
Lymphs Abs: 2 10*3/uL (ref 0.7–4.0)
Monocytes Absolute: 0.7 10*3/uL (ref 0.1–1.0)
Monocytes Relative: 11 %
Neutro Abs: 3.6 10*3/uL (ref 1.7–7.7)
Neutrophils Relative %: 56 %

## 2022-05-20 LAB — CBC
HCT: 38 % — ABNORMAL LOW (ref 39.0–52.0)
Hemoglobin: 12.7 g/dL — ABNORMAL LOW (ref 13.0–17.0)
MCH: 31.5 pg (ref 26.0–34.0)
MCHC: 33.4 g/dL (ref 30.0–36.0)
MCV: 94.3 fL (ref 80.0–100.0)
Platelets: 300 10*3/uL (ref 150–400)
RBC: 4.03 MIL/uL — ABNORMAL LOW (ref 4.22–5.81)
RDW: 13.5 % (ref 11.5–15.5)
WBC: 6.5 10*3/uL (ref 4.0–10.5)
nRBC: 0 % (ref 0.0–0.2)

## 2022-05-20 LAB — PROTIME-INR
INR: 1 (ref 0.8–1.2)
Prothrombin Time: 12.9 seconds (ref 11.4–15.2)

## 2022-05-20 LAB — APTT: aPTT: 32 seconds (ref 24–36)

## 2022-05-20 LAB — ETHANOL: Alcohol, Ethyl (B): 458 mg/dL (ref <10)

## 2022-05-20 LAB — CBG MONITORING, ED: Glucose-Capillary: 94 mg/dL (ref 70–99)

## 2022-05-20 MED ORDER — SODIUM CHLORIDE 0.9 % IV BOLUS
1000.0000 mL | Freq: Once | INTRAVENOUS | Status: AC
  Administered 2022-05-20: 1000 mL via INTRAVENOUS

## 2022-05-20 MED ORDER — THIAMINE HCL 100 MG/ML IJ SOLN
100.0000 mg | Freq: Every day | INTRAMUSCULAR | Status: DC
Start: 2022-05-21 — End: 2022-05-21

## 2022-05-20 NOTE — ED Notes (Signed)
ED Provider at bedside. 

## 2022-05-20 NOTE — ED Notes (Signed)
Patient transported to MRI 

## 2022-05-20 NOTE — ED Provider Notes (Signed)
[facility]-EMERGENCY DEPT Provider Note   CSN: [id number] Arrival date & time: 05/20/22  1950     History {Add pertinent medical, surgical, social history, OB history to HPI:1} Chief Complaint  Patient presents with   Altered Mental Status    Xavier Owens is a 51 y.o. male.  He is brought in for evaluation of possible stroke.  Symptoms started possibly on July 3.  He is having some stuttering in his speech and blurry vision.  He is a daily drinker.  He is difficult to get much history out of.  The history is provided by the patient and a friend.  Cerebrovascular Accident This is a new problem. The current episode started more than 2 days ago. The problem occurs constantly. The problem has not changed since onset.Pertinent negatives include no chest pain, no abdominal pain, no headaches and no shortness of breath. Nothing aggravates the symptoms. Nothing relieves the symptoms. He has tried nothing for the symptoms. The treatment provided no relief.       Home Medications Prior to Admission medications   Medication Sig Start Date End Date Taking? Authorizing Provider  gabapentin (NEURONTIN) 300 MG capsule Take 1 capsule (300 mg total) by mouth at bedtime. 12/15/21   Carlyn Reichert, MD  naltrexone (DEPADE) 50 MG tablet Take 1 tablet (50 mg total) by mouth daily. 12/21/21   Eliezer Bottom, MD  QUEtiapine (SEROQUEL) 25 MG tablet Take 1 tablet (25 mg total) by mouth at bedtime as needed (For use if gabapentin does not work). 12/15/21   Carlyn Reichert, MD      Allergies    Patient has no known allergies.    Review of Systems   Review of Systems  Constitutional:  Negative for fever.  Eyes:  Positive for visual disturbance.  Respiratory:  Negative for shortness of breath.   Cardiovascular:  Negative for chest pain.  Gastrointestinal:  Negative for abdominal pain.  Musculoskeletal:  Negative for neck pain.  Skin:  Negative for rash.  Neurological:  Positive for  speech difficulty. Negative for headaches.    Physical Exam Updated Vital Signs BP (!) 155/114   Pulse (!) 139   Temp 98.2 F (36.8 C) (Oral)   Resp 19   Ht 5\' 8"  (1.727 m)   Wt 75.7 kg   SpO2 95%   BMI 25.38 kg/m  Physical Exam Vitals and nursing note reviewed.  Constitutional:      General: He is not in acute distress.    Appearance: Normal appearance. He is well-developed.  HENT:     Head: Normocephalic and atraumatic.  Eyes:     Conjunctiva/sclera: Conjunctivae normal.  Cardiovascular:     Rate and Rhythm: Regular rhythm. Tachycardia present.     Heart sounds: No murmur heard. Pulmonary:     Effort: Pulmonary effort is normal. No respiratory distress.     Breath sounds: Normal breath sounds.  Abdominal:     Palpations: Abdomen is soft.     Tenderness: There is no abdominal tenderness. There is no guarding or rebound.  Musculoskeletal:        General: Normal range of motion.     Cervical back: Neck supple.     Right lower leg: No edema.     Left lower leg: No edema.  Skin:    General: Skin is warm and dry.     Capillary Refill: Capillary refill takes less than 2 seconds.  Neurological:     General: No focal deficit present.  Mental Status: He is alert.     Sensory: Sensory deficit present.     Motor: No weakness.     Comments: He has no facial asymmetry.  He does have some stuttering speech.  Possibly has some worsening vision on the left.  Normal strength.  Subjective decrease sensation left face left arm left leg.     ED Results / Procedures / Treatments   Labs (all labs ordered are listed, but only abnormal results are displayed) Labs Reviewed  PROTIME-INR  APTT  CBC  DIFFERENTIAL  COMPREHENSIVE METABOLIC PANEL  ETHANOL  CBG MONITORING, ED  I-STAT CHEM 8, ED    EKG None  Radiology No results found.  Procedures Procedures  {Document cardiac monitor, telemetry assessment procedure when appropriate:1}  Medications Ordered in  ED Medications - No data to display  ED Course/ Medical Decision Making/ A&P                           Medical Decision Making Amount and/or Complexity of Data Reviewed Labs: ordered. Radiology: ordered.   This patient complains of ***; this involves an extensive number of treatment Options and is a complaint that carries with it a high risk of complications and morbidity. The differential includes ***  I ordered, reviewed and interpreted labs, which included *** I ordered medication *** and reviewed PMP when indicated. I ordered imaging studies which included *** and I independently    visualized and interpreted imaging which showed *** Additional history obtained from *** Previous records obtained and reviewed *** I consulted *** and discussed lab and imaging findings and discussed disposition.  Cardiac monitoring reviewed, *** Social determinants considered, *** Critical Interventions: ***  After the interventions stated above, I reevaluated the patient and found *** Admission and further testing considered, ***   {Document critical care time when appropriate:1} {Document review of labs and clinical decision tools ie heart score, Chads2Vasc2 etc:1}  {Document your independent review of radiology images, and any outside records:1} {Document your discussion with family members, caretakers, and with consultants:1} {Document social determinants of health affecting pt's care:1} {Document your decision making why or why not admission, treatments were needed:1} Final Clinical Impression(s) / ED Diagnoses Final diagnoses:  None    Rx / DC Orders ED Discharge Orders     None

## 2022-05-20 NOTE — ED Triage Notes (Signed)
Ambulatory to ED with c/o altered mental status. Per ex-wife, daughter called her today and said she was worried. Unknown confirmed LWK, but per pt he says 7/3. Confused with expressive aphasia in triage.   Hx of ETOH abuse -states he drinks a 5th per day.

## 2022-05-21 NOTE — ED Provider Notes (Addendum)
Care of the patient assumed at the change of shift. Here for AMS, slurred speech. Markedly elevated EtOH. Pending MRI.   MRI is negative, patient and wife informed of this result. Will allow him to sober in the ED and reassess for dispo.     5:20 AM Patient is improved from a mental stand point. Able to stand and walk, eat and drink. Will d/c with referral to Neurology for his speech difficulties. Advised to curtail his EtOH use. RTED for any other concerns.    Pollyann Savoy, MD 05/21/22 [phone]

## 2022-06-07 ENCOUNTER — Encounter: Payer: Commercial Managed Care - HMO | Admitting: Student

## 2022-06-10 ENCOUNTER — Encounter: Payer: Self-pay | Admitting: Internal Medicine

## 2022-06-10 ENCOUNTER — Other Ambulatory Visit: Payer: Self-pay

## 2022-06-10 ENCOUNTER — Ambulatory Visit (INDEPENDENT_AMBULATORY_CARE_PROVIDER_SITE_OTHER): Payer: Commercial Managed Care - HMO | Admitting: Internal Medicine

## 2022-06-10 VITALS — BP 125/77 | HR 90 | Temp 98.1°F | Ht 67.0 in | Wt 176.4 lb

## 2022-06-10 DIAGNOSIS — F1020 Alcohol dependence, uncomplicated: Secondary | ICD-10-CM

## 2022-06-10 DIAGNOSIS — E02 Subclinical iodine-deficiency hypothyroidism: Secondary | ICD-10-CM

## 2022-06-10 DIAGNOSIS — E038 Other specified hypothyroidism: Secondary | ICD-10-CM

## 2022-06-10 DIAGNOSIS — Z0000 Encounter for general adult medical examination without abnormal findings: Secondary | ICD-10-CM | POA: Insufficient documentation

## 2022-06-10 DIAGNOSIS — Z0100 Encounter for examination of eyes and vision without abnormal findings: Secondary | ICD-10-CM

## 2022-06-10 MED ORDER — QUETIAPINE FUMARATE 25 MG PO TABS: 25.0000 mg | ORAL_TABLET | Freq: Every evening | ORAL | 3 refills | Status: DC | PRN

## 2022-06-10 NOTE — Assessment & Plan Note (Addendum)
The patient presents to the [facility] today after being seen in the emergency department earlier this month.  At that time the patient presented with speech stuttering, left-sided numbness, and vision changes.  It was originally thought that he might be having a stroke, however, CT and MRI ruled this out.  The patient's ethanol level was 458 and it was thought that his symptoms were secondary to alcohol intoxication.  He was treated appropriately and then was discharged home and was strongly advised to reduce his alcohol intake.  Patiently, the patient states that he has not been taking naltrexone and discontinued this earlier this year.  The patient states that since he was in the emergency room, he has cut down on his alcohol intake.  He has gone from drinking fifth to a pint of liquor per day and states that he has been feeling well without any signs of alcohol withdrawal.  He is going to [place] in 1 week for a detox program and that he will be going to [place] with the [facility] a few months.  I congratulated the patient on taking this neck step in his alcohol use disorder recovery journey and he is very eager to see what progress he can make with the program.  Plan: - CMP today - Patient to go to alcohol detox program next week; we will follow up with him once he returns to [facility] - Will calculate Fib-4 score once CMP returns  Addendum 7/31: AST elevated at 60. Fib-4 score calculated to be 2.45 points, warranting further investigation. Called patient and made aware that when he returns from his detox program, that we should get an ultrasound of his liver.

## 2022-06-10 NOTE — Progress Notes (Signed)
CC: ER follow up  HPI:  [name] is a 51 y.o. hypertension, hypothyroidism, and alcohol use disorder who presents to the [facility] after being seen in the ED. Please see problem-based list for further details, assessments, and plans.    Past Medical History:  Diagnosis Date   Alcoholic (HCC)    Hypertension    Seizures (HCC)    Review of Systems:  Review of Systems  Constitutional:  Negative for chills and fever.  HENT: Negative.    Respiratory: Negative.    Cardiovascular: Negative.   Gastrointestinal:  Negative for diarrhea, nausea and vomiting.  Neurological:  Negative for dizziness, tremors, seizures, loss of consciousness and headaches.  Psychiatric/Behavioral:  Negative for depression. The patient is not nervous/anxious.      Physical Exam:  Vitals:   06/10/22 0928  BP: 125/77  Pulse: 90  Temp: 98.1 F (36.7 C)  TempSrc: Oral  SpO2: 96%  Weight: 176 lb 6.4 oz (80 kg)  Height: 5\' 7"  (1.702 m)   General: No acute distress. CV: RRR. No murmurs. No LE edema Pulmonary: Lungs CTAB. Normal effort. No wheezing or rales. Abdominal: Soft, nontender, nondistended.  Skin: Warm and dry. Neuro: A&Ox3. No focal deficit. Psych: Normal mood and affect   Assessment & Plan:   See Encounters Tab for problem based charting.  Patient discussed with Dr.   Alcohol use disorder The patient presents to the [facility] today after being seen in the emergency department earlier this month.  At that time the patient presented with speech stuttering, left-sided numbness, and vision changes.  It was originally thought that he might be having a stroke, however, CT and MRI ruled this out.  The patient's ethanol level was 458 and it was thought that his symptoms were secondary to alcohol intoxication.  He was treated appropriately and then was discharged home and was strongly advised to reduce his alcohol intake.  Patiently, the patient states that he has not been taking naltrexone  and discontinued this earlier this year.  The patient states that since he was in the emergency room, he has cut down on his alcohol intake.  He has gone from drinking fifth to a pint of liquor per day and states that he has been feeling well without any signs of alcohol withdrawal.  He is going to [facility] in 1 week for a detox program and that he will be going to [place] with [facility] a few months.  I congratulated the patient on taking this neck step in his alcohol use disorder recovery journey and he is very eager to see what progress he can make with the program.  Plan: - CMP today - Patient to go to alcohol detox program next week; we will follow up with him once he returns to [facility] - Will calculate Fib-4 score once CMP returns  Addendum 7/31: AST elevated at 60. Fib-4 score calculated to be 2.45 points, warranting further investigation. Called patient and made aware that when he returns from his detox program, that we should get an ultrasound of his liver.     Subclinical hypothyroidism The patient has been found to have elevated TSH levels in the past although he has had normal free T4 levels.  He denies any symptoms of hypothyroidism including fatigue, cold intolerance, weight gain, constipation, or dry skin.   Plan: - TSH and free T4 today  Addendum 7/31: TSH and free T4 within normal limits. Patient called and made aware.  Healthcare  maintenance - Patient has already been referred to GI for colonoscopy and will call them to make an appointment - Referral to optometry for annual eye exam

## 2022-06-10 NOTE — Assessment & Plan Note (Addendum)
-   Patient has already been referred to GI for colonoscopy and will call them to make an appointment - Referral to optometry for annual eye exam

## 2022-06-10 NOTE — Assessment & Plan Note (Addendum)
The patient has been found to have elevated TSH levels in the past although he has had normal free T4 levels.  He denies any symptoms of hypothyroidism including fatigue, cold intolerance, weight gain, constipation, or dry skin.   Plan: - TSH and free T4 today  Addendum 7/31: TSH and free T4 within normal limits. Patient called and made aware.

## 2022-06-10 NOTE — Patient Instructions (Signed)
Thank you, [name] for allowing [place] to provide your care today. Today we discussed:  Alcohol use: I am glad to hear you are going to [facility] in [place]- I wish you the best of luck with your recovery journey and want to congratulate you for taking this step. We are going to check your liver tests today with your blood work  Thyroid: Your thyroid was slightly underactive the last we checked- so we will recheck it today  Please call to schedule your colonoscopy to screen for colon cancer  I have ordered the following labs for you:   Lab Orders         TSH         CMP14 + Anion Gap         T4, Free      Referrals ordered today:   Referral Orders  No referral(s) requested today     I have ordered the following medication/changed the following medications:   Stop the following medications: Medications Discontinued During This Encounter  Medication Reason   naltrexone (DEPADE) 50 MG tablet    gabapentin (NEURONTIN) 300 MG capsule Side effect (s)   QUEtiapine (SEROQUEL) 25 MG tablet Reorder     Start the following medications: Meds ordered this encounter  Medications   QUEtiapine (SEROQUEL) 25 MG tablet    Sig: Take 1 tablet (25 mg total) by mouth at bedtime as needed (For use if gabapentin does not work).    Dispense:  30 tablet    Refill:  3     Follow up: 4-6 months after you have completed treatment   Should you have any questions or concerns please call the internal medicine clinic at [phone].     Xavier Owens, D.O. [name]

## 2022-06-11 LAB — CMP14 + ANION GAP
ALT: 18 IU/L (ref 0–44)
AST: 60 IU/L — ABNORMAL HIGH (ref 0–40)
Albumin/Globulin Ratio: 1.4 (ref 1.2–2.2)
Albumin: 4.5 g/dL (ref 3.8–4.9)
Alkaline Phosphatase: 119 IU/L (ref 44–121)
Anion Gap: 20 mmol/L — ABNORMAL HIGH (ref 10.0–18.0)
BUN/Creatinine Ratio: 11 (ref 9–20)
BUN: 7 mg/dL (ref 6–24)
Bilirubin Total: 0.3 mg/dL (ref 0.0–1.2)
CO2: 18 mmol/L — ABNORMAL LOW (ref 20–29)
Calcium: 8.5 mg/dL — ABNORMAL LOW (ref 8.7–10.2)
Chloride: 102 mmol/L (ref 96–106)
Creatinine, Ser: 0.65 mg/dL — ABNORMAL LOW (ref 0.76–1.27)
Globulin, Total: 3.2 g/dL (ref 1.5–4.5)
Glucose: 84 mg/dL (ref 70–99)
Potassium: 3.8 mmol/L (ref 3.5–5.2)
Sodium: 140 mmol/L (ref 134–144)
Total Protein: 7.7 g/dL (ref 6.0–8.5)
eGFR: 113 mL/min/{1.73_m2} (ref >59)

## 2022-06-11 LAB — TSH: TSH: 1.02 u[IU]/mL (ref 0.450–4.500)

## 2022-06-11 LAB — T4, FREE: Free T4: 0.96 ng/dL (ref 0.82–1.77)

## 2022-06-13 NOTE — Progress Notes (Signed)
Internal Medicine Clinic Attending ° °Case discussed with Dr. Atway  At the time of the visit.  We reviewed the resident’s history and exam and pertinent patient test results.  I agree with the assessment, diagnosis, and plan of care documented in the resident’s note.  °

## 2022-06-13 NOTE — Progress Notes (Signed)
Patient called.  Patient aware. TSH/free T4 within normal limits. Fib-4 score calculated based on CMP results (and CBC results from previous visit) and is 2.45 (suggesting that further investigation is needed). Patient called and made aware that he will need an ultrasound of his liver when he returns from his detox program.

## 2022-07-21 ENCOUNTER — Telehealth: Payer: Self-pay | Admitting: Student

## 2022-07-21 NOTE — Telephone Encounter (Signed)
Pt calling to report he has completed his detox program.  Pt calling to ask about his Referral to the GI and an Ultrasound Imaging Order.  Please advise

## 2022-07-22 NOTE — Addendum Note (Signed)
Addended by: Elza Rafter on: 07/22/2022 08:29 AM   Modules accepted: Orders

## 2022-07-28 ENCOUNTER — Other Ambulatory Visit: Payer: Self-pay

## 2022-07-28 ENCOUNTER — Telehealth: Payer: Self-pay | Admitting: Student

## 2022-07-28 DIAGNOSIS — F515 Nightmare disorder: Secondary | ICD-10-CM

## 2022-07-28 DIAGNOSIS — F1020 Alcohol dependence, uncomplicated: Secondary | ICD-10-CM

## 2022-07-28 MED ORDER — TRAZODONE HCL 100 MG PO TABS: 100.0000 mg | ORAL_TABLET | Freq: Every day | ORAL | 1 refills | Status: DC

## 2022-07-28 MED ORDER — NALTREXONE HCL 50 MG PO TABS: 50.0000 mg | ORAL_TABLET | Freq: Every day | ORAL | 1 refills | Status: DC

## 2022-07-28 MED ORDER — QUETIAPINE FUMARATE 100 MG PO TABS: 100.0000 mg | ORAL_TABLET | Freq: Every day | ORAL | 1 refills | Status: DC

## 2022-07-28 NOTE — Telephone Encounter (Signed)
I called patient to check on his medication list.  He was recently at [facility] for rehab and was discharged last Friday 07/22/22.  I called [facility] to obtain his medication list.  I confirmed that he is taking Seroquel 100 mg at bedtime.  I input his med list in the chart and asked [facility] to fax over his medication list to the clinic.

## 2022-07-28 NOTE — Telephone Encounter (Signed)
All medications refilled 90 day supply

## 2022-07-28 NOTE — Addendum Note (Signed)
Addended byDoran Stabler on: 07/28/2022 11:43 AM   Modules accepted: Orders

## 2022-07-28 NOTE — Telephone Encounter (Signed)
Only med listed is seroquel. Please advise.

## 2022-08-02 ENCOUNTER — Other Ambulatory Visit: Payer: Self-pay

## 2022-08-02 ENCOUNTER — Emergency Department (HOSPITAL_COMMUNITY)
Admission: EM | Admit: 2022-08-02 | Discharge: 2022-08-02 | Disposition: A | Discharge status: Home / Self Care | Payer: Commercial Managed Care - HMO | Attending: Emergency Medicine | Admitting: Emergency Medicine

## 2022-08-02 ENCOUNTER — Encounter (HOSPITAL_COMMUNITY): Payer: Self-pay

## 2022-08-02 DIAGNOSIS — R112 Nausea with vomiting, unspecified: Secondary | ICD-10-CM | POA: Insufficient documentation

## 2022-08-02 DIAGNOSIS — Y900 Blood alcohol level of less than 20 mg/100 ml: Secondary | ICD-10-CM | POA: Insufficient documentation

## 2022-08-02 LAB — COMPREHENSIVE METABOLIC PANEL
ALT: 18 U/L (ref 0–44)
AST: 42 U/L — ABNORMAL HIGH (ref 15–41)
Albumin: 5 g/dL (ref 3.5–5.0)
Alkaline Phosphatase: 130 U/L — ABNORMAL HIGH (ref 38–126)
Anion gap: 20 — ABNORMAL HIGH (ref 5–15)
BUN: 11 mg/dL (ref 6–20)
CO2: 23 mmol/L (ref 22–32)
Calcium: 9.8 mg/dL (ref 8.9–10.3)
Chloride: 96 mmol/L — ABNORMAL LOW (ref 98–111)
Creatinine, Ser: 1.01 mg/dL (ref 0.61–1.24)
GFR, Estimated: >60 mL/min (ref >60)
Glucose, Bld: 172 mg/dL — ABNORMAL HIGH (ref 70–99)
Potassium: 3.5 mmol/L (ref 3.5–5.1)
Sodium: 139 mmol/L (ref 135–145)
Total Bilirubin: 0.9 mg/dL (ref 0.3–1.2)
Total Protein: 10 g/dL — ABNORMAL HIGH (ref 6.5–8.1)

## 2022-08-02 LAB — CBC
HCT: 41 % (ref 39.0–52.0)
Hemoglobin: 14 g/dL (ref 13.0–17.0)
MCH: 28.8 pg (ref 26.0–34.0)
MCHC: 34.1 g/dL (ref 30.0–36.0)
MCV: 84.4 fL (ref 80.0–100.0)
Platelets: 381 10*3/uL (ref 150–400)
RBC: 4.86 MIL/uL (ref 4.22–5.81)
RDW: 13.2 % (ref 11.5–15.5)
WBC: 17 10*3/uL — ABNORMAL HIGH (ref 4.0–10.5)
nRBC: 0 % (ref 0.0–0.2)

## 2022-08-02 LAB — ETHANOL: Alcohol, Ethyl (B): <10 mg/dL (ref <10)

## 2022-08-02 LAB — LIPASE, BLOOD: Lipase: 42 U/L (ref 11–51)

## 2022-08-02 MED ORDER — ONDANSETRON HCL 4 MG/2ML IJ SOLN
4.0000 mg | Freq: Once | INTRAMUSCULAR | Status: AC
  Administered 2022-08-02: 4 mg via INTRAVENOUS
  Filled 2022-08-02: qty 2

## 2022-08-02 MED ORDER — ONDANSETRON 4 MG PO TBDP
4.0000 mg | ORAL_TABLET | Freq: Once | ORAL | Status: AC
  Administered 2022-08-02: 4 mg via ORAL
  Filled 2022-08-02: qty 1

## 2022-08-02 MED ORDER — SODIUM CHLORIDE 0.9 % IV BOLUS
1000.0000 mL | Freq: Once | INTRAVENOUS | Status: AC
  Administered 2022-08-02: 1000 mL via INTRAVENOUS

## 2022-08-02 MED ORDER — ONDANSETRON HCL 4 MG PO TABS: 4.0000 mg | ORAL_TABLET | Freq: Four times a day (QID) | ORAL | 0 refills | Status: DC

## 2022-08-02 MED ORDER — LORAZEPAM 2 MG/ML IJ SOLN
1.0000 mg | Freq: Once | INTRAMUSCULAR | Status: AC
  Administered 2022-08-02: 1 mg via INTRAVENOUS
  Filled 2022-08-02: qty 1

## 2022-08-02 NOTE — Discharge Instructions (Addendum)
Return for any problem.   Do not consume alcohol.

## 2022-08-02 NOTE — ED Triage Notes (Signed)
Patient's SO reports that the patient has been out of rehab x 12 days. Patient took Naltrexone last night and started vomiting last night. Patient states he had a seizure this AM.

## 2022-08-02 NOTE — ED Provider Triage Note (Signed)
Emergency Medicine Provider Triage Evaluation Note  Xavier Owens , a 51 y.o. male  was evaluated in triage.  Pt complains of NV states last night/this AM symptoms began. Recently released from etoh rehab and states he has had NO etoh but states he has been taking his medications which include naltrexone.   Denies any substance use.   Endorses ?seizure (no bowel/bladder incontinence and was unwitnessed).   Review of Systems  Positive: NV Negative: Fever   Physical Exam  BP (!) 138/108 (BP Location: Right Arm)   Pulse 89   Temp 98.3 F (36.8 C) (Oral)   Resp 18   Ht 5' 8.5" (1.74 m)   Wt 74.8 kg   SpO2 99%   BMI 24.72 kg/m  Gen:   Awake, no distress   Resp:  Normal effort  MSK:   Moves extremities without difficulty  Other:  Abd soft NTTP  Medical Decision Making  Medically screening exam initiated at 3:15 PM.  Appropriate orders placed.  Nic Lampe was informed that the remainder of the evaluation will be completed by another provider, this initial triage assessment does not replace that evaluation, and the importance of remaining in the ED until their evaluation is complete.  Labs. EKG   Tedd Sias, [place] 08/02/22 1517

## 2022-08-02 NOTE — ED Provider Notes (Signed)
[facility] DEPT Provider Note   CSN: [id number] Arrival date & time: 08/02/22  1456     History  No chief complaint on file.   Xavier Owens is a 51 y.o. male.  51 year old male with prior medical history as detailed below presents for evaluation.  Patient reports that he has been sober for approximately 12 days.  Patient reports that last night he had some alcohol.  At some point he took some naltrexone as well.  Patient reports nausea and vomiting that began early this morning.  Upon evaluation the patient is actively vomiting.  He denies pain.  He reports significant nausea.  Denies recent fever.  He denies bloody emesis or bloody stools.  The history is provided by the patient and medical records.       Home Medications Prior to Admission medications   Medication Sig Start Date End Date Taking? Authorizing Provider  DULoxetine (CYMBALTA) 30 MG capsule Take 30 mg by mouth daily.    [provider]  folic acid (FOLVITE) 1 MG tablet Take 1 mg by mouth daily. 06/21/22   [provider]  gabapentin (NEURONTIN) 300 MG capsule Take 300 mg by mouth 3 (three) times daily as needed.    [provider]  hydrOXYzine (VISTARIL) 50 MG capsule Take 50 mg by mouth 3 (three) times daily. 07/05/22   [provider]  methocarbamol (ROBAXIN) 500 MG tablet Take 500 mg by mouth 3 (three) times daily as needed.    [provider]  naltrexone (DEPADE) 50 MG tablet Take 1 tablet (50 mg total) by mouth daily. 07/28/22   Gaylan Gerold, DO  prazosin (MINIPRESS) 1 MG capsule Take 1 mg by mouth at bedtime.    [provider]  QUEtiapine (SEROQUEL) 100 MG tablet Take 1 tablet (100 mg total) by mouth at bedtime. 07/28/22   Gaylan Gerold, DO  thiamine (VITAMIN B1) 100 MG tablet Take 100 mg by mouth daily. 06/21/22   [provider]  traZODone (DESYREL) 100 MG tablet Take 1 tablet (100 mg total) by mouth at bedtime.  07/28/22   Gaylan Gerold, DO      Allergies    Patient has no known allergies.    Review of Systems   Review of Systems  All other systems reviewed and are negative.   Physical Exam Updated Vital Signs BP (!) 152/110   Pulse 84   Temp 98.3 F (36.8 C) (Oral)   Resp (!) 24   Ht 5' 8.5" (1.74 m)   Wt 74.8 kg   SpO2 97%   BMI 24.72 kg/m  Physical Exam Vitals and nursing note reviewed.  Constitutional:      General: He is not in acute distress.    Appearance: He is well-developed.     Comments: Alert, appears ill, actively vomiting  HENT:     Head: Normocephalic and atraumatic.  Eyes:     Conjunctiva/sclera: Conjunctivae normal.     Pupils: Pupils are equal, round, and reactive to light.  Cardiovascular:     Rate and Rhythm: Normal rate and regular rhythm.     Heart sounds: Normal heart sounds.  Pulmonary:     Effort: Pulmonary effort is normal. No respiratory distress.     Breath sounds: Normal breath sounds.  Abdominal:     General: There is no distension.     Palpations: Abdomen is soft.     Tenderness: There is no abdominal tenderness.  Musculoskeletal:  General: No deformity. Normal range of motion.     Cervical back: Normal range of motion and neck supple.  Skin:    General: Skin is warm and dry.  Neurological:     General: No focal deficit present.     Mental Status: He is alert and oriented to person, place, and time.     ED Results / Procedures / Treatments   Labs (all labs ordered are listed, but only abnormal results are displayed) Labs Reviewed  CBC - Abnormal; Notable for the following components:      Result Value   WBC 17.0 (*)    All other components within normal limits  COMPREHENSIVE METABOLIC PANEL - Abnormal; Notable for the following components:   Chloride 96 (*)    Glucose, Bld 172 (*)    Total Protein 10.0 (*)    AST 42 (*)    Alkaline Phosphatase 130 (*)    Anion gap 20 (*)    All other components within normal limits   LIPASE, BLOOD  ETHANOL    EKG EKG Interpretation  Date/Time:  Tuesday August 02 2022 16:08:39 EDT Ventricular Rate:  86 PR Interval:  150 QRS Duration: 112 QT Interval:  476 QTC Calculation: 570 R Axis:   11 Text Interpretation: Sinus rhythm Borderline intraventricular conduction delay RSR' in V1 or V2, right VCD or RVH Abnormal inferior Q waves Prolonged QT interval Baseline wander in lead(s) III aVL V1 Confirmed by Dene Gentry [phone]) on 08/02/2022 4:10:44 PM  Radiology No results found.  Procedures Procedures    Medications Ordered in ED Medications  ondansetron (ZOFRAN-ODT) disintegrating tablet 4 mg (4 mg Oral Given 08/02/22 1519)  sodium chloride 0.9 % bolus 1,000 mL (1,000 mLs Intravenous New Bag/Given 08/02/22 1602)  ondansetron (ZOFRAN) injection 4 mg (4 mg Intravenous Given 08/02/22 1603)  LORazepam (ATIVAN) injection 1 mg (1 mg Intravenous Given 08/02/22 1603)    ED Course/ Medical Decision Making/ A&P                           Medical Decision Making Amount and/or Complexity of Data Reviewed Labs: ordered.  Risk Prescription drug management.    Medical Screen Complete  This patient presented to the ED with complaint of nausea and vomiting   This complaint involves an extensive number of treatment options. The initial differential diagnosis includes, but is not limited to, dehydration, metabolic abnormality, alcohol intoxication, etc.  This presentation is: Acute, Self-Limited, Previously Undiagnosed, Uncertain Prognosis, Complicated, Systemic Symptoms, and Threat to Life/Bodily Function  Patient is presenting with complaint of nausea and vomiting.  Patient reports that he did consume alcohol yesterday evening.  Symptoms began this morning.    Patient given IV fluids, Ativan, antiemetics.  With treatment in the ED he feels significantly improved.  He is taking good p.o. after treatment.  He now desires discharge.  He does understand need for  close outpatient follow-up.  He is strongly advised to avoid additional alcohol consumption.  Strict return precautions given and understood.  Additional history obtained:  External records from outside sources obtained and reviewed including prior ED visits and prior Inpatient records.    Lab Tests:  I ordered and personally interpreted labs.  The pertinent results include: CBC, CMP, lipase, EtOH   Cardiac Monitoring:  The patient was maintained on a cardiac monitor.  I personally viewed and interpreted the cardiac monitor which showed an underlying rhythm of: NSR   Medicines ordered:  I ordered medication including Zofran, Ativan, IV fluids for vomiting Reevaluation of the patient after these medicines showed that the patient: improved   Problem List / ED Course:  Nausea and vomiting   Reevaluation:  After the interventions noted above, I reevaluated the patient and found that they have: improved   Disposition:  After consideration of the diagnostic results and the patients response to treatment, I feel that the patent would benefit from close outpatient follow-up.            Final Clinical Impression(s) / ED Diagnoses Final diagnoses:  Nausea and vomiting, unspecified vomiting type    Rx / DC Orders ED Discharge Orders     None         Valarie Merino, MD 08/02/22 1843

## 2022-08-15 ENCOUNTER — Encounter: Payer: Self-pay | Admitting: Student

## 2022-08-15 NOTE — Progress Notes (Deleted)
   CC: check-up   HPI:  Xavier Owens is a 51 y.o. male with past medical history of HTN, ETOH use disorder, and substance-induced mood disorder that presents for a check-up.    Allergies as of 08/15/2022   No Known Allergies      Medication List        Accurate as of August 15, 2022  7:11 AM. If you have any questions, ask your nurse or doctor.          DULoxetine 30 MG capsule Commonly known as: CYMBALTA Take 30 mg by mouth daily.   folic acid 1 MG tablet Commonly known as: FOLVITE Take 1 mg by mouth daily.   gabapentin 300 MG capsule Commonly known as: NEURONTIN Take 300 mg by mouth 3 (three) times daily as needed.   hydrOXYzine 50 MG capsule Commonly known as: VISTARIL Take 50 mg by mouth 3 (three) times daily.   methocarbamol 500 MG tablet Commonly known as: ROBAXIN Take 500 mg by mouth 3 (three) times daily as needed.   naltrexone 50 MG tablet Commonly known as: DEPADE Take 1 tablet (50 mg total) by mouth daily.   ondansetron 4 MG tablet Commonly known as: ZOFRAN Take 1 tablet (4 mg total) by mouth every 6 (six) hours.   prazosin 1 MG capsule Commonly known as: MINIPRESS Take 1 mg by mouth at bedtime.   QUEtiapine 100 MG tablet Commonly known as: SEROquel Take 1 tablet (100 mg total) by mouth at bedtime.   thiamine 100 MG tablet Commonly known as: VITAMIN B1 Take 100 mg by mouth daily.   traZODone 100 MG tablet Commonly known as: DESYREL Take 1 tablet (100 mg total) by mouth at bedtime.         Past Medical History:  Diagnosis Date   Alcoholic ([place])    Hypertension    Seizures ([place])    Review of Systems:  per HPI.   Physical Exam: *** There were no vitals filed for this visit.  *** Constitutional: Well-developed, well-nourished, appears comfortable  HENT: Normocephalic and atraumatic.  Eyes: EOM are normal. PERRL.  Neck: Normal range of motion.  Cardiovascular: Regular rate, regular rhythm. No murmurs, rubs, or  gallops. Normal radial and PT pulses bilaterally. No LE edema.  Pulmonary: Normal respiratory effort. No wheezes, rales, or rhonchi.   Abdominal: Soft. Non-distended. No tenderness. Normal bowel sounds.  Musculoskeletal: Normal range of motion.     Neurological: Alert and oriented to person, place, and time. Non-focal. Skin: warm and dry.    Assessment & Plan:   Current medications include  ?: *** HA, dizziness, lightheadedness, CP, SOB, cravings,   HTN - Current medications include none  Plan: -   2. ETOH use disorder - Current medications include naltrexone 50 MG tablet - Presented to ED in 07/2022 w/ n/v after drinking alcohol while taking naltrexone - Last drink?  Plan: -  3. - Current medications include  Plan: -  4. Health Screening: - Influenza vaccine (), colonoscopy () - Medication refill?  Plan: -   See Encounters Tab for problem based charting.  Patient seen with Dr. Barbaraann Boys

## 2022-08-16 ENCOUNTER — Emergency Department (HOSPITAL_COMMUNITY)
Admission: EM | Admit: 2022-08-16 | Discharge: 2022-08-17 | Disposition: A | Discharge status: Home / Self Care | Payer: Commercial Managed Care - HMO | Attending: Emergency Medicine | Admitting: Emergency Medicine

## 2022-08-16 ENCOUNTER — Emergency Department (HOSPITAL_COMMUNITY): Payer: Commercial Managed Care - HMO

## 2022-08-16 ENCOUNTER — Encounter (HOSPITAL_COMMUNITY): Payer: Self-pay

## 2022-08-16 ENCOUNTER — Other Ambulatory Visit: Payer: Self-pay

## 2022-08-16 DIAGNOSIS — R1110 Vomiting, unspecified: Secondary | ICD-10-CM | POA: Diagnosis not present

## 2022-08-16 DIAGNOSIS — M791 Myalgia, unspecified site: Secondary | ICD-10-CM | POA: Insufficient documentation

## 2022-08-16 DIAGNOSIS — R5383 Other fatigue: Secondary | ICD-10-CM | POA: Diagnosis not present

## 2022-08-16 DIAGNOSIS — Z87891 Personal history of nicotine dependence: Secondary | ICD-10-CM | POA: Insufficient documentation

## 2022-08-16 DIAGNOSIS — R5381 Other malaise: Secondary | ICD-10-CM | POA: Diagnosis not present

## 2022-08-16 DIAGNOSIS — I10 Essential (primary) hypertension: Secondary | ICD-10-CM | POA: Diagnosis not present

## 2022-08-16 DIAGNOSIS — R059 Cough, unspecified: Secondary | ICD-10-CM | POA: Diagnosis not present

## 2022-08-16 DIAGNOSIS — J029 Acute pharyngitis, unspecified: Secondary | ICD-10-CM | POA: Insufficient documentation

## 2022-08-16 DIAGNOSIS — Z20822 Contact with and (suspected) exposure to covid-19: Secondary | ICD-10-CM | POA: Insufficient documentation

## 2022-08-16 DIAGNOSIS — R079 Chest pain, unspecified: Secondary | ICD-10-CM | POA: Insufficient documentation

## 2022-08-16 LAB — BASIC METABOLIC PANEL
Anion gap: 15 (ref 5–15)
BUN: 9 mg/dL (ref 6–20)
CO2: 25 mmol/L (ref 22–32)
Calcium: 9.9 mg/dL (ref 8.9–10.3)
Chloride: 96 mmol/L — ABNORMAL LOW (ref 98–111)
Creatinine, Ser: 0.89 mg/dL (ref 0.61–1.24)
GFR, Estimated: >60 mL/min (ref >60)
Glucose, Bld: 110 mg/dL — ABNORMAL HIGH (ref 70–99)
Potassium: 3.8 mmol/L (ref 3.5–5.1)
Sodium: 136 mmol/L (ref 135–145)

## 2022-08-16 LAB — CBC
HCT: 43.5 % (ref 39.0–52.0)
Hemoglobin: 14.4 g/dL (ref 13.0–17.0)
MCH: 28.8 pg (ref 26.0–34.0)
MCHC: 33.1 g/dL (ref 30.0–36.0)
MCV: 87 fL (ref 80.0–100.0)
Platelets: 338 10*3/uL (ref 150–400)
RBC: 5 MIL/uL (ref 4.22–5.81)
RDW: 14.2 % (ref 11.5–15.5)
WBC: 9.9 10*3/uL (ref 4.0–10.5)
nRBC: 0 % (ref 0.0–0.2)

## 2022-08-16 LAB — ETHANOL: Alcohol, Ethyl (B): <10 mg/dL (ref <10)

## 2022-08-16 LAB — HEPATIC FUNCTION PANEL
ALT: 18 U/L (ref 0–44)
AST: 36 U/L (ref 15–41)
Albumin: 4.8 g/dL (ref 3.5–5.0)
Alkaline Phosphatase: 128 U/L — ABNORMAL HIGH (ref 38–126)
Bilirubin, Direct: 0.2 mg/dL (ref 0.0–0.2)
Indirect Bilirubin: 1.2 mg/dL — ABNORMAL HIGH (ref 0.3–0.9)
Total Bilirubin: 1.4 mg/dL — ABNORMAL HIGH (ref 0.3–1.2)
Total Protein: 9.4 g/dL — ABNORMAL HIGH (ref 6.5–8.1)

## 2022-08-16 LAB — LIPASE, BLOOD: Lipase: 42 U/L (ref 11–51)

## 2022-08-16 LAB — TROPONIN I (HIGH SENSITIVITY): Troponin I (High Sensitivity): 4 ng/L (ref <18)

## 2022-08-16 MED ORDER — ACETAMINOPHEN 500 MG PO TABS
1000.0000 mg | ORAL_TABLET | Freq: Once | ORAL | Status: AC
  Administered 2022-08-17: 1000 mg via ORAL
  Filled 2022-08-16: qty 2

## 2022-08-16 MED ORDER — SODIUM CHLORIDE 0.9 % IV BOLUS
1000.0000 mL | Freq: Once | INTRAVENOUS | Status: AC
  Administered 2022-08-17: 1000 mL via INTRAVENOUS

## 2022-08-16 MED ORDER — LORAZEPAM 2 MG/ML IJ SOLN
1.0000 mg | Freq: Once | INTRAMUSCULAR | Status: AC
  Administered 2022-08-17: 1 mg via INTRAVENOUS
  Filled 2022-08-16: qty 1

## 2022-08-16 NOTE — ED Triage Notes (Addendum)
Patient c/o constant mid chest pain, n/v, and slight dizziness. Chest pain started this AM  Patient states he normally drinks 1 pint of Vodka a day and states he has not had any in 2 days. Patient is very fidgety and anxious in triage.

## 2022-08-17 LAB — TROPONIN I (HIGH SENSITIVITY): Troponin I (High Sensitivity): 5 ng/L (ref <18)

## 2022-08-17 LAB — GROUP A STREP BY PCR: Group A Strep by PCR: NOT DETECTED

## 2022-08-17 LAB — SARS CORONAVIRUS 2 BY RT PCR: SARS Coronavirus 2 by RT PCR: NEGATIVE

## 2022-08-17 NOTE — Discharge Instructions (Signed)
You were evaluated in the Emergency Department and after careful evaluation, we did not find any emergent condition requiring admission or further testing in the hospital.  Your exam/testing today was overall reassuring.  Please return to the Emergency Department if you experience any worsening of your condition.  Thank you for allowing us to be a part of your care.  

## 2022-08-17 NOTE — ED Provider Notes (Signed)
[facility] Emergency Department Provider Note MRN:  [id number]  Arrival date & time: 08/17/22     Chief Complaint   Chest Pain and Emesis   History of Present Illness   Xavier Owens is a 51 y.o. year-old male with a history of hypertension, alcohol use disorder presenting to the ED with chief complaint of chest pain and emesis.  Cough, sore throat, emesis, burning chest pain over the past few days.  Malaise, fatigue, body aches.  What hurts most at this time is the sore throat.  Review of Systems  A thorough review of systems was obtained and all systems are negative except as noted in the HPI and PMH.   Patient's Health History    Past Medical History:  Diagnosis Date   Alcoholic ([place])    Hypertension    Seizures ([place])     Past Surgical History:  Procedure Laterality Date   dislocated hip     HAND SURGERY     KNEE SURGERY      Family History  Problem Relation Age of Onset   Cancer Mother    Cancer Father     Social History   Socioeconomic History   Marital status: Married    Spouse name: Not on file   Number of children: Not on file   Years of education: Not on file   Highest education level: Not on file  Occupational History   Not on file  Tobacco Use   Smoking status: Former    Packs/day: 0.10    Types: Cigarettes   Smokeless tobacco: Never  Vaping Use   Vaping Use: Never used  Substance and Sexual Activity   Alcohol use: Yes    Comment: "a fifth of vodka every day for 30 years"   Drug use: No   Sexual activity: Yes  Other Topics Concern   Not on file  Social History Narrative   Not on file   Social Determinants of Health   Financial Resource Strain: Not on file  Food Insecurity: Not on file  Transportation Needs: Not on file  Physical Activity: Not on file  Stress: Not on file  Social Connections: Not on file  Intimate Partner Violence: Not on file     Physical Exam   Vitals:   08/17/22 0215 08/17/22 0230   BP: (!) 146/87 134/84  Pulse: 93 (!) 107  Resp:  18  Temp:    SpO2: 96% 96%    CONSTITUTIONAL: Well-appearing, NAD NEURO/PSYCH:  Alert and oriented x 3, no focal deficits EYES:  eyes equal and reactive ENT/NECK:  no LAD, no JVD CARDIO: Regular rate, well-perfused, normal S1 and S2 PULM:  CTAB no wheezing or rhonchi GI/GU:  non-distended, non-tender MSK/SPINE:  No gross deformities, no edema SKIN:  no rash, atraumatic   *Additional and/or pertinent findings included in MDM below  Diagnostic and Interventional Summary    EKG Interpretation  Date/Time:  Tuesday August 16 2022 18:54:12 EDT Ventricular Rate:  103 PR Interval:  149 QRS Duration: 96 QT Interval:  340 QTC Calculation: 445 R Axis:   -21 Text Interpretation: Sinus tachycardia RSR' in V1 or V2, right VCD or RVH Inferior infarct, old Confirmed by Gerlene Fee [phone]) on 08/16/2022 11:53:53 PM       Labs Reviewed  BASIC METABOLIC PANEL - Abnormal; Notable for the following components:      Result Value   Chloride 96 (*)    Glucose, Bld 110 (*)    All other  components within normal limits  HEPATIC FUNCTION PANEL - Abnormal; Notable for the following components:   Total Protein 9.4 (*)    Alkaline Phosphatase 128 (*)    Total Bilirubin 1.4 (*)    Indirect Bilirubin 1.2 (*)    All other components within normal limits  SARS CORONAVIRUS 2 BY RT PCR  GROUP A STREP BY PCR  CBC  LIPASE, BLOOD  ETHANOL  TROPONIN I (HIGH SENSITIVITY)  TROPONIN I (HIGH SENSITIVITY)    DG Chest 2 View  Final Result      Medications  sodium chloride 0.9 % bolus 1,000 mL (1,000 mLs Intravenous New Bag/Given 08/17/22 0047)  LORazepam (ATIVAN) injection 1 mg (1 mg Intravenous Given 08/17/22 0048)  acetaminophen (TYLENOL) tablet 1,000 mg (1,000 mg Oral Given 08/17/22 0048)     Procedures  /  Critical Care Procedures  ED Course and Medical Decision Making  Initial Impression and Ddx Symptoms are most suggestive of a viral  illness.  Patient is a bit anxious at this time, may also be mildly withdrawing from alcohol.  Drinks a pint of vodka every day.  Erythema to the posterior oropharynx, strep is considered, COVID-19 considered.  ACS is considered but felt to be less likely given the more likely viral process driving the other symptoms.  Awaiting labs, troponin, COVID testing etc.  Past medical/surgical history that increases complexity of ED encounter: None  Interpretation of Diagnostics I personally reviewed the EKG and my interpretation is as follows: Sinus tachycardia  Labs reassuring with no significant blood count or electrolyte disturbance, troponin negative x2  Patient Reassessment and Ultimate Disposition/Management     Patient feeling a lot better on repeat evaluation, no longer tachycardic, appropriate for discharge.  Patient management required discussion with the following services or consulting groups:  None  Complexity of Problems Addressed Acute illness or injury that poses threat of life of bodily function  Additional Data Reviewed and Analyzed Further history obtained from: None  Additional Factors Impacting ED Encounter Risk Use of parenteral controlled substances  Elmer Sow. Pilar Plate, MD [facility] Emergency Medicine [facility] [email or web address]  Final Clinical Impressions(s) / ED Diagnoses     ICD-10-CM   1. Chest pain, unspecified type  R07.9       ED Discharge Orders     None        Discharge Instructions Discussed with and Provided to Patient:     Discharge Instructions      You were evaluated in the Emergency Department and after careful evaluation, we did not find any emergent condition requiring admission or further testing in the hospital.  Your exam/testing today was overall reassuring.  Please return to the Emergency Department if you experience any worsening of your condition.  Thank you for allowing [place] to be a part of your  care.        Sabas Sous, MD 08/17/22 [phone]

## 2022-08-26 ENCOUNTER — Ambulatory Visit (HOSPITAL_COMMUNITY): Payer: Commercial Managed Care - HMO

## 2022-09-02 ENCOUNTER — Ambulatory Visit (HOSPITAL_COMMUNITY): Payer: Commercial Managed Care - HMO | Attending: Internal Medicine

## 2022-09-16 ENCOUNTER — Encounter: Payer: Commercial Managed Care - HMO | Admitting: Student

## 2022-09-23 ENCOUNTER — Encounter: Payer: Commercial Managed Care - HMO | Admitting: Student

## 2023-01-24 ENCOUNTER — Emergency Department (HOSPITAL_COMMUNITY): Payer: 59

## 2023-01-24 ENCOUNTER — Emergency Department (HOSPITAL_COMMUNITY)
Admission: EM | Admit: 2023-01-24 | Discharge: 2023-01-24 | Disposition: A | Discharge status: Home / Self Care | Payer: 59 | Attending: Emergency Medicine | Admitting: Emergency Medicine

## 2023-01-24 ENCOUNTER — Other Ambulatory Visit: Payer: Self-pay

## 2023-01-24 DIAGNOSIS — M545 Low back pain, unspecified: Secondary | ICD-10-CM

## 2023-01-24 DIAGNOSIS — M546 Pain in thoracic spine: Secondary | ICD-10-CM | POA: Insufficient documentation

## 2023-01-24 DIAGNOSIS — M5459 Other low back pain: Secondary | ICD-10-CM | POA: Diagnosis not present

## 2023-01-24 DIAGNOSIS — G8929 Other chronic pain: Secondary | ICD-10-CM | POA: Insufficient documentation

## 2023-01-24 DIAGNOSIS — M5137 Other intervertebral disc degeneration, lumbosacral region: Secondary | ICD-10-CM | POA: Diagnosis not present

## 2023-01-24 MED ORDER — DIAZEPAM 5 MG PO TABS
5.0000 mg | ORAL_TABLET | Freq: Once | ORAL | Status: AC
  Administered 2023-01-24: 5 mg via ORAL
  Filled 2023-01-24: qty 1

## 2023-01-24 MED ORDER — PREDNISONE 20 MG PO TABS: 40.0000 mg | ORAL_TABLET | Freq: Every day | ORAL | 0 refills | Status: AC

## 2023-01-24 MED ORDER — OXYCODONE HCL 5 MG PO TABS
5.0000 mg | ORAL_TABLET | Freq: Once | ORAL | Status: AC
  Administered 2023-01-24: 5 mg via ORAL
  Filled 2023-01-24: qty 1

## 2023-01-24 MED ORDER — KETOROLAC TROMETHAMINE 15 MG/ML IJ SOLN
15.0000 mg | Freq: Once | INTRAMUSCULAR | Status: AC
  Administered 2023-01-24: 15 mg via INTRAMUSCULAR
  Filled 2023-01-24: qty 1

## 2023-01-24 MED ORDER — ACETAMINOPHEN 500 MG PO TABS
1000.0000 mg | ORAL_TABLET | Freq: Once | ORAL | Status: AC
  Administered 2023-01-24: 1000 mg via ORAL
  Filled 2023-01-24: qty 2

## 2023-01-24 NOTE — ED Provider Notes (Signed)
[facility] Provider Note   CSN: [id number] Arrival date & time: 01/24/23  1227     History  Chief Complaint  Patient presents with   Back Pain    Xavier Owens is a 52 y.o. male.  52 y.o male with no PMH presents to the ED with a chief complaint of low back pain x 1 year. Patient endorses stabbing, cramping sensation to his lumbar spine radiating to bilateral hips, knees and feet. Patient has not taken any medication for improvement in symptoms. He does walk about 3 miles to get to his work daily. Exacerbated symptoms with walking and standing up. No alleviating factors. Has mention this to his PCP Dr. Roni Bread but it has not been addressed. No IVDU, urinary retention, bowel incontinence, or fevers.   The history is provided by the patient and medical records.  Back Pain Location:  Lumbar spine Associated symptoms: no fever        Home Medications Prior to Admission medications   Medication Sig Start Date End Date Taking? Authorizing Provider  predniSONE (DELTASONE) 20 MG tablet Take 2 tablets (40 mg total) by mouth daily for 5 days. 01/24/23 01/29/23 Yes Mitra Duling, Beverley Fiedler, [name]  DULoxetine (CYMBALTA) 30 MG capsule Take 30 mg by mouth daily.    [provider]  folic acid (FOLVITE) 1 MG tablet Take 1 mg by mouth daily. 06/21/22   [provider]  gabapentin (NEURONTIN) 300 MG capsule Take 300 mg by mouth 3 (three) times daily as needed.    [provider]  hydrOXYzine (VISTARIL) 50 MG capsule Take 50 mg by mouth 3 (three) times daily. 07/05/22   [provider]  methocarbamol (ROBAXIN) 500 MG tablet Take 500 mg by mouth 3 (three) times daily as needed.    [provider]  naltrexone (DEPADE) 50 MG tablet Take 1 tablet (50 mg total) by mouth daily. 07/28/22   Gaylan Gerold, DO  ondansetron (ZOFRAN) 4 MG tablet Take 1 tablet (4 mg total) by mouth every 6 (six) hours. 08/02/22   Valarie Merino, MD   prazosin (MINIPRESS) 1 MG capsule Take 1 mg by mouth at bedtime.    [provider]  QUEtiapine (SEROQUEL) 100 MG tablet Take 1 tablet (100 mg total) by mouth at bedtime. 07/28/22   Gaylan Gerold, DO  thiamine (VITAMIN B1) 100 MG tablet Take 100 mg by mouth daily. 06/21/22   [provider]  traZODone (DESYREL) 100 MG tablet Take 1 tablet (100 mg total) by mouth at bedtime. 07/28/22   Gaylan Gerold, DO      Allergies    Patient has no known allergies.    Review of Systems   Review of Systems  Constitutional:  Negative for chills and fever.  Genitourinary:  Negative for difficulty urinating.  Musculoskeletal:  Positive for back pain.    Physical Exam Updated Vital Signs BP (!) 141/90 (BP Location: Left Arm)   Pulse 98   Temp 98 F (36.7 C) (Oral)   Resp 18   Ht 5' 8.5" (1.74 m)   Wt 77.1 kg   SpO2 97%   BMI 25.47 kg/m  Physical Exam Vitals and nursing note reviewed.  Constitutional:      Appearance: Normal appearance.  HENT:     Head: Normocephalic and atraumatic.     Mouth/Throat:     Mouth: Mucous membranes are moist.  Cardiovascular:     Rate and Rhythm: Normal rate.  Pulmonary:  Effort: Pulmonary effort is normal.     Breath sounds: No wheezing.  Abdominal:     General: Abdomen is flat.  Musculoskeletal:     Cervical back: Normal range of motion and neck supple.     Lumbar back: Tenderness present.     Comments: RLE- KF,KE 5/5 strength LLE- HF, HE 5/5 strength Normal gait. No pronator drift. No leg drop.  CN I, II and VIII not tested. CN II-XII grossly intact bilaterally.       Skin:    General: Skin is warm and dry.  Neurological:     Mental Status: He is alert and oriented to person, place, and time.     ED Results / Procedures / Treatments   Labs (all labs ordered are listed, but only abnormal results are displayed) Labs Reviewed - No data to display  EKG None  Radiology DG Lumbar Spine Complete  Result Date:  01/24/2023 CLINICAL DATA:  Lower back pain radiating down bilateral hips. EXAM: LUMBAR SPINE - COMPLETE 4+ VIEW COMPARISON:  Radiographs dated September 23, 2015 FINDINGS: There is no evidence of lumbar spine fracture. Alignment is normal. Mild degenerate disc disease at L5-S1 with associated facet joint arthropathy. IMPRESSION: Mild degenerative disc disease at L5-S1 with associated facet joint arthropathy. No acute fracture or subluxation. Electronically Signed   By: Keane Police D.O.   On: 01/24/2023 14:40    Procedures Procedures    Medications Ordered in ED Medications  ketorolac (TORADOL) 15 MG/ML injection 15 mg (15 mg Intramuscular Given 01/24/23 1457)  acetaminophen (TYLENOL) tablet 1,000 mg (1,000 mg Oral Given 01/24/23 1457)  oxyCODONE (Oxy IR/ROXICODONE) immediate release tablet 5 mg (5 mg Oral Given 01/24/23 1458)  diazepam (VALIUM) tablet 5 mg (5 mg Oral Given 01/24/23 1458)    ED Course/ Medical Decision Making/ A&P                             Medical Decision Making Amount and/or Complexity of Data Reviewed Radiology: ordered.  Risk OTC drugs. Prescription drug management.   Patient presents to the ED with low back pain that is been ongoing for the past year, evaluated by his primary care physician approximate year ago but has not been taking anything for pain control.  Describing as a stabbing, cramping sensation to the lumbar spine radiating to his hips, knees, feet.  Exacerbated with walking 3 miles in order to get to work.  No red flag such as prior history of cancer, fever, urinary retention, bowel incontinence.  During evaluation patient is ambulatory in the ED with a steady gait.  He is afebrile, normal neurological exam performed by me.  We discussed imaging versus symptomatic treatment.   Xray of his lumbar spine Mild degenerative disc disease at L5-S1 with associated facet joint  arthropathy.    No acute fracture or subluxation.   These results were discussed  with patient at length.  He was given a referral to the spine specialist in order to obtain further management of his ongoing chronic back pain.  Sent home with a short course of steroids in order to help with his symptoms, denies any prior history of diabetes.  Patient is hemodynamically stable for discharge.    Portions of this note were generated with [employer]. Dictation errors may occur despite best attempts at proofreading.   Final Clinical Impression(s) / ED Diagnoses Final diagnoses:  Chronic midline low back pain without sciatica  Rx / DC Orders ED Discharge Orders          Ordered    predniSONE (DELTASONE) 20 MG tablet  Daily        01/24/23 1448              Janeece Fitting, [name] 01/24/23 1503    Valarie Merino, MD 01/29/23 [phone]

## 2023-01-24 NOTE — Discharge Instructions (Addendum)
I have prescribed a short course of steroids to help with your pain.  Please take 2 tablets daily for the next 5 days.  You will need to schedule an appointment with the spine specialist in order to further evaluate your ongoing back pain.

## 2023-01-24 NOTE — ED Triage Notes (Signed)
C/o lower back pain radiating into bilateral hips and knees x1 year.  Robaxin w/o relief  Pt reports walks to work and is about 3 miles and pain is worse about 6 hours into work.

## 2023-04-10 ENCOUNTER — Encounter: Payer: Self-pay | Admitting: *Deleted

## 2023-08-05 ENCOUNTER — Other Ambulatory Visit: Payer: Self-pay

## 2023-08-05 ENCOUNTER — Encounter (HOSPITAL_COMMUNITY): Payer: Self-pay | Admitting: *Deleted

## 2023-08-05 ENCOUNTER — Ambulatory Visit (HOSPITAL_COMMUNITY)
Admission: EM | Admit: 2023-08-05 | Discharge: 2023-08-05 | Disposition: A | Discharge status: Home / Self Care | Payer: 59

## 2023-08-05 DIAGNOSIS — U071 COVID-19: Secondary | ICD-10-CM | POA: Diagnosis not present

## 2023-08-05 NOTE — ED Provider Notes (Signed)
[facility]    CSN: [id number] Arrival date & time: 08/05/23  1023      History   Chief Complaint Covid test  HPI Yeltsin Hishmeh is a 52 y.o. male.  Had 2 positive covid tests on 9/12 He is 9 days out from the tests, symptoms have resolved and he is feeling much better Needs a note to return to work He was requesting a covid test to confirm negative   Past Medical History:  Diagnosis Date   Alcoholic (HCC)    Hypertension    Seizures (HCC)     Patient Active Problem List   Diagnosis Date Noted   Healthcare maintenance 06/10/2022   Nightmares 12/21/2021   Subclinical hypothyroidism 12/21/2021   Substance induced mood disorder (HCC) 12/14/2021   Alcohol use disorder 06/23/2021   Seizures (HCC) 06/22/2021   Elevated liver enzymes 04/19/2016   Essential hypertension 04/19/2016    Past Surgical History:  Procedure Laterality Date   dislocated hip     HAND SURGERY     KNEE SURGERY         Home Medications    Prior to Admission medications   Medication Sig Start Date End Date Taking? Authorizing Provider  DULoxetine (CYMBALTA) 30 MG capsule Take 30 mg by mouth daily.   Yes [provider]  naltrexone (DEPADE) 50 MG tablet Take 1 tablet (50 mg total) by mouth daily. 07/28/22  Yes Doran Stabler, DO  ondansetron (ZOFRAN) 4 MG tablet Take 1 tablet (4 mg total) by mouth every 6 (six) hours. 08/02/22  Yes Wynetta Fines, MD  folic acid (FOLVITE) 1 MG tablet Take 1 mg by mouth daily. 06/21/22   [provider]  gabapentin (NEURONTIN) 300 MG capsule Take 300 mg by mouth 3 (three) times daily as needed.    [provider]  hydrOXYzine (VISTARIL) 50 MG capsule Take 50 mg by mouth 3 (three) times daily. 07/05/22   [provider]  methocarbamol (ROBAXIN) 500 MG tablet Take 500 mg by mouth 3 (three) times daily as needed.    [provider]  prazosin (MINIPRESS) 1 MG capsule Take 1 mg by mouth at bedtime.    [provider]  QUEtiapine (SEROQUEL) 100 MG tablet Take 1 tablet (100 mg total) by mouth at bedtime. 07/28/22   Doran Stabler, DO  thiamine (VITAMIN B1) 100 MG tablet Take 100 mg by mouth daily. 06/21/22   [provider]  traZODone (DESYREL) 100 MG tablet Take 1 tablet (100 mg total) by mouth at bedtime. 07/28/22   Doran Stabler, DO    Family History Family History  Problem Relation Age of Onset   Cancer Mother    Cancer Father     Social History Social History   Tobacco Use   Smoking status: Former    Current packs/day: 0.10    Types: Cigarettes   Smokeless tobacco: Never  Vaping Use   Vaping status: Never Used  Substance Use Topics   Alcohol use: Yes    Comment: "a fifth of vodka every day for 30 years"   Drug use: No     Allergies   Patient has no known allergies.   Review of Systems Review of Systems As per HPI  Physical Exam Triage Vital Signs ED Triage Vitals  Encounter Vitals Group     BP 08/05/23 1052 (!) 135/90     Systolic BP Percentile --      Diastolic BP Percentile --  Pulse Rate 08/05/23 1052 83     Resp 08/05/23 1052 18     Temp 08/05/23 1052 97.9 F (36.6 C)     Temp src --      SpO2 08/05/23 1052 93 %     Weight --      Height --      Head Circumference --      Peak Flow --      Pain Score 08/05/23 1047 0     Pain Loc --      Pain Education --      Exclude from Growth Chart --    No data found.  Updated Vital Signs BP (!) 135/90   Pulse 83   Temp 97.9 F (36.6 C)   Resp 18   SpO2 93%    Physical Exam Vitals and nursing note reviewed.  Constitutional:      General: He is not in acute distress.    Appearance: Normal appearance.  Cardiovascular:     Rate and Rhythm: Normal rate and regular rhythm.     Heart sounds: Normal heart sounds.  Pulmonary:     Effort: Pulmonary effort is normal.     Breath sounds: Normal breath sounds.  Neurological:     Mental Status: He is alert and oriented to person, place, and time.      UC Treatments / Results  Labs (all labs ordered are listed, but only abnormal results are displayed) Labs Reviewed - No data to display  EKG  Radiology No results found.  Procedures Procedures (including critical care time)  Medications Ordered in UC Medications - No data to display  Initial Impression / Assessment and Plan / UC Course  I have reviewed the triage vital signs and the nursing notes.  Pertinent labs & imaging results that were available during my care of the patient were reviewed by me and considered in my medical decision making (see chart for details).  Discussed we do not confirm with a negative test since the swabs can be positive for several weeks. He has no remaining symptoms. Return to work note provided.  Final Clinical Impressions(s) / UC Diagnoses   Final diagnoses:  COVID-19   Discharge Instructions   None    ED Prescriptions   None    PDMP not reviewed this encounter.   Marlow Baars, [place] 08/05/23 1133

## 2023-08-05 NOTE — ED Triage Notes (Addendum)
Pt reports he had 2 positive home test on 07-27-23.Pt reports he has worked with staff that have been positive for COVID. Pt still feel great and wants to be tested for COVID to return to work

## 2023-08-08 IMAGING — MR MR HEAD W/O CM
5 of 11 series · 20 of 48 positions shown · non-contrast
Comparison: May 2015

CLINICAL DATA: Seizure, nontraumatic; alcohol use with possible
seizure

EXAM:
MRI HEAD WITHOUT CONTRAST
TECHNIQUE: Multiplanar, multiecho pulse sequences of the brain and surrounding
structures were obtained without intravenous contrast.

[Series 2: DWI · axial · 3.0mm · 0.94mm/px · z∈[-49,+98]mm · 8 of 97 slices shown (1 of 2)]
[im 1/97]
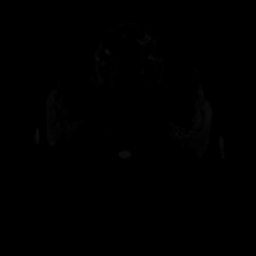
[im 11/97]
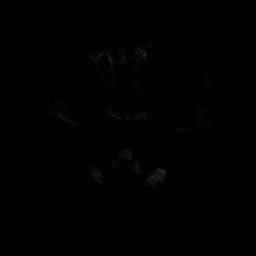
[im 33/97]
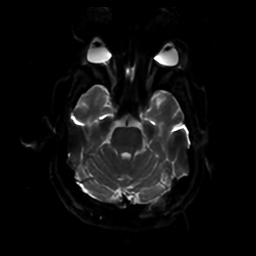
[im 43/97]
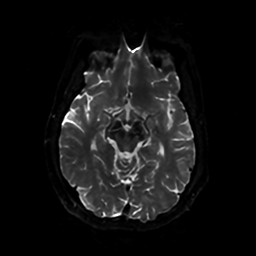
[im 54/97]
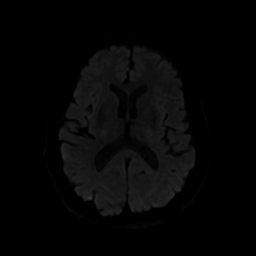
[im 65/97]
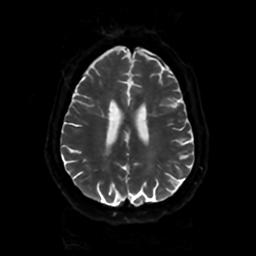
[im 86/97]
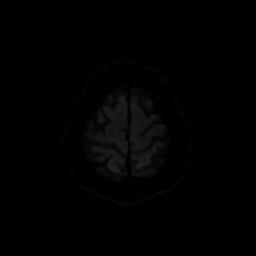
[im 97/97]
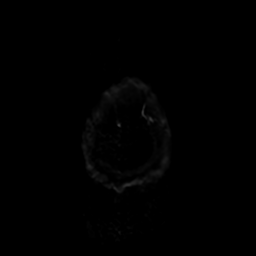

[Series 3: DWI · coronal · 4.0mm · 0.94mm/px · 4 of 70 slices shown (2 of 2)]
[im 1/70]
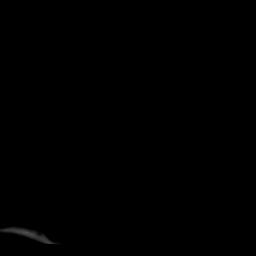
[im 10/70]
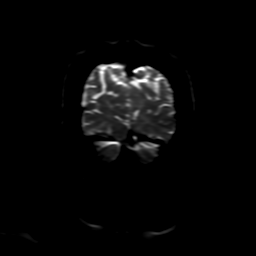
[im 20/70]
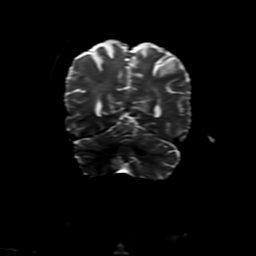
[im 30/70]
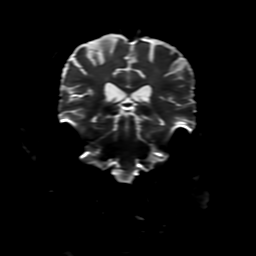

[Series 4: FLAIR · sagittal · 5.0mm · 0.23mm/px · 3 of 23 slices shown (1 of 3)]
[im 1/23]
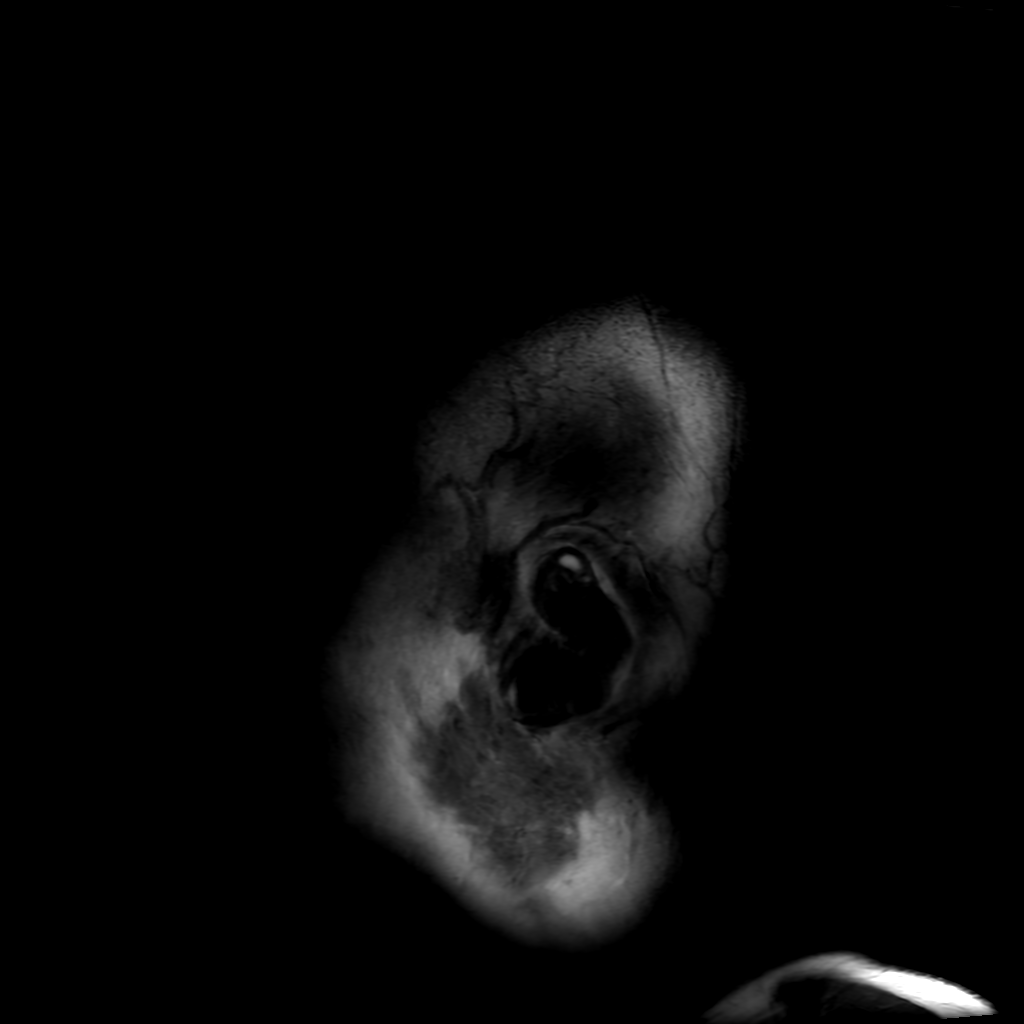
[im 12/23]
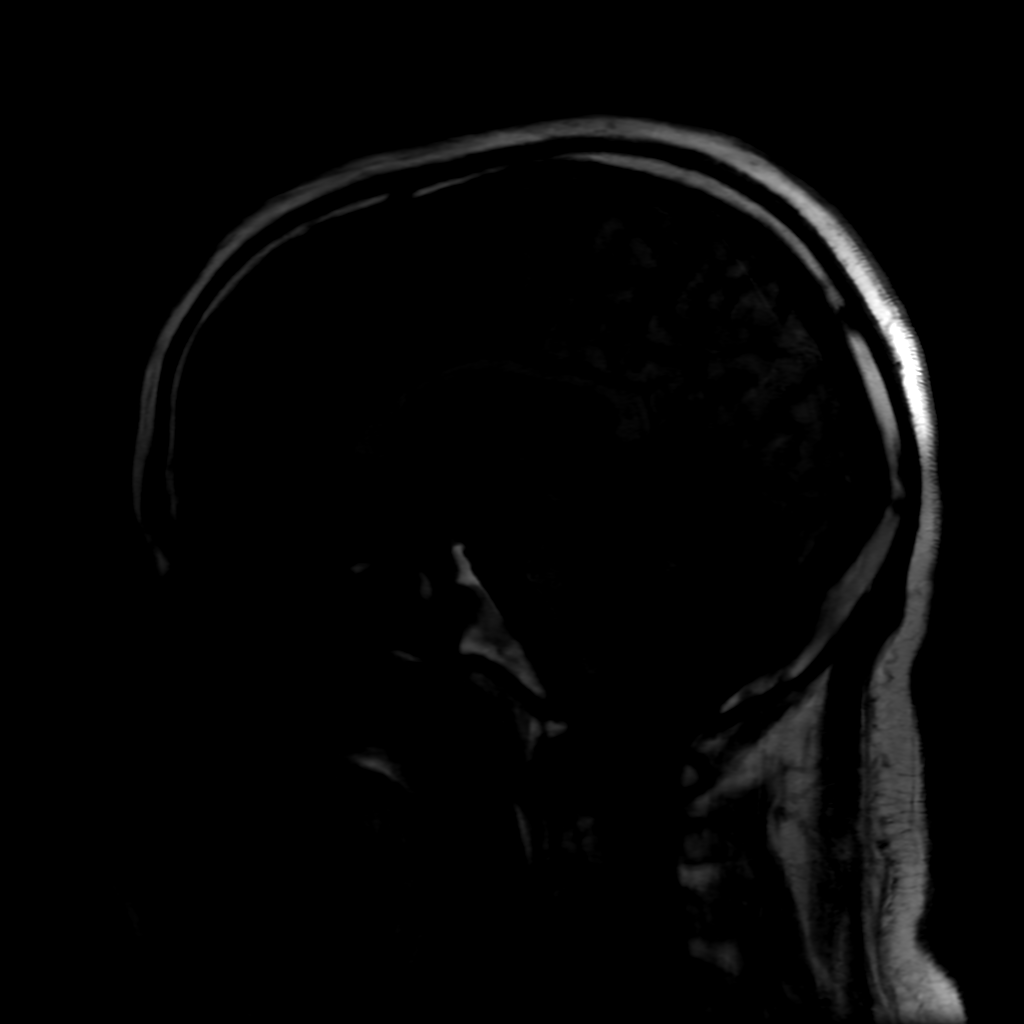
[im 23/23]
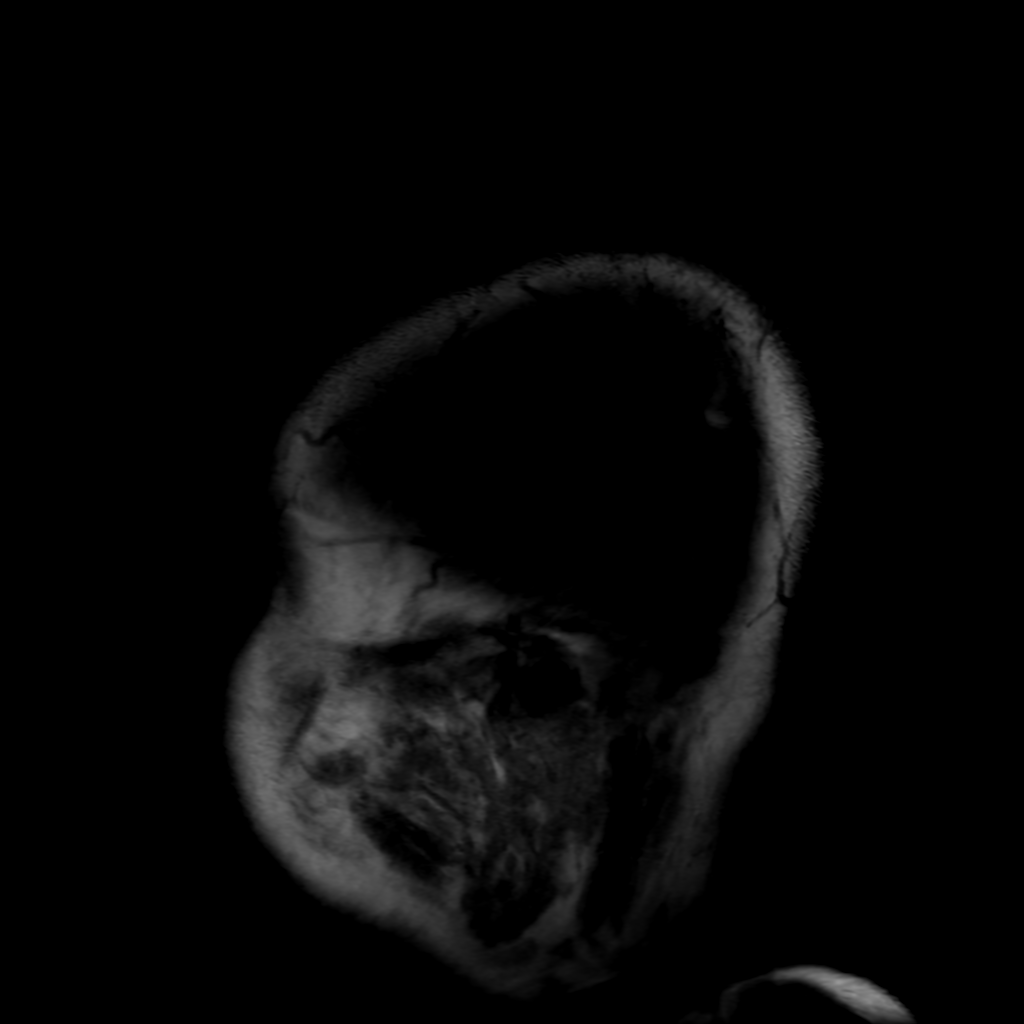

[Series 6: FLAIR · axial · 4.0mm · 0.45mm/px · z∈[-45,+96]mm · 2 of 17 slices shown (2 of 3)]
[im 1/17]
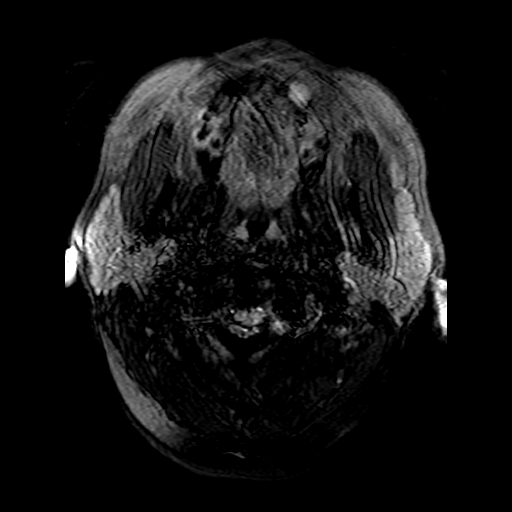
[im 17/17]
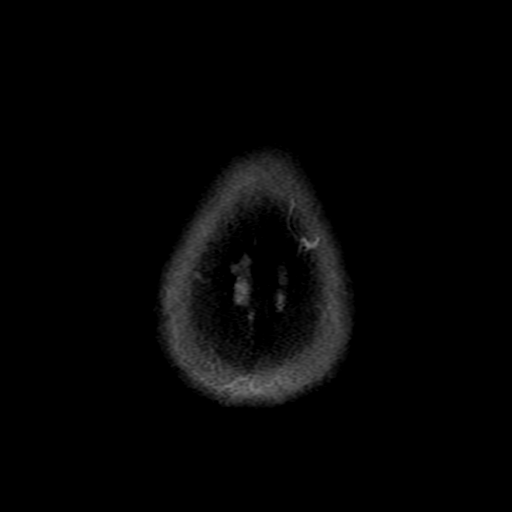

[Series 8: FLAIR · axial · 5.0mm · 0.47mm/px · z∈[-64,+78]mm · 3 of 25 slices shown (3 of 3)]
[im 1/25]
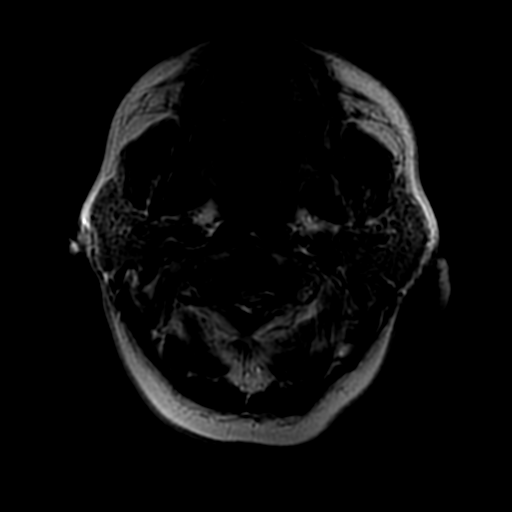
[im 13/25]
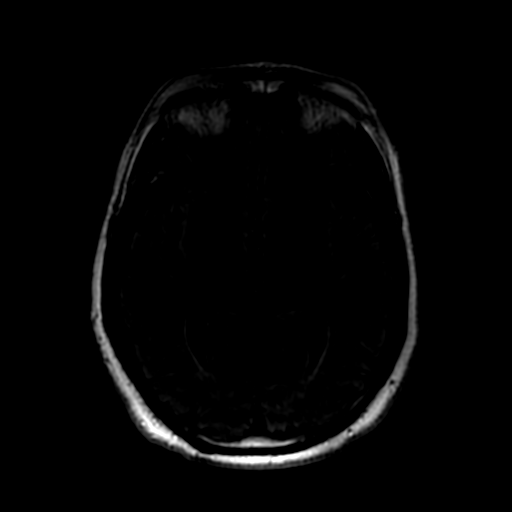
[im 25/25]
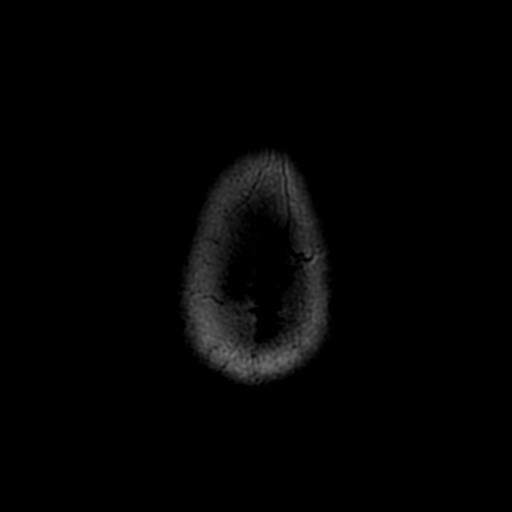

[20 of 48 positions shown; findings below may reference images not displayed]

FINDINGS: Motion artifact is present.

Brain: There is no acute infarction or intracranial hemorrhage.
There is no intracranial mass, mass effect, or edema. There is no
hydrocephalus or extra-axial fluid collection. Ventricles and sulci
are similar in size and configuration.

Vascular: Major vessel flow voids at the skull base are preserved.

Skull and upper cervical spine: Normal marrow signal is preserved.

Sinuses/Orbits: Circumferential maxillary sinus mucosal thickening.
Orbits are unremarkable.

Other: Sella is unremarkable.  Mastoid air cells are clear.
IMPRESSION: Motion degraded.  No recent infarction, hemorrhage, or mass.

## 2024-01-16 ENCOUNTER — Emergency Department (HOSPITAL_COMMUNITY)
Admission: EM | Admit: 2024-01-16 | Discharge: 2024-01-17 | Disposition: A | Discharge status: Home / Self Care | Payer: MEDICAID | Attending: Emergency Medicine | Admitting: Emergency Medicine

## 2024-01-16 ENCOUNTER — Encounter (HOSPITAL_COMMUNITY): Payer: Self-pay | Admitting: Emergency Medicine

## 2024-01-16 ENCOUNTER — Other Ambulatory Visit: Payer: Self-pay

## 2024-01-16 DIAGNOSIS — Y908 Blood alcohol level of 240 mg/100 ml or more: Secondary | ICD-10-CM | POA: Insufficient documentation

## 2024-01-16 DIAGNOSIS — F10129 Alcohol abuse with intoxication, unspecified: Secondary | ICD-10-CM | POA: Insufficient documentation

## 2024-01-16 DIAGNOSIS — I10 Essential (primary) hypertension: Secondary | ICD-10-CM | POA: Diagnosis not present

## 2024-01-16 DIAGNOSIS — E876 Hypokalemia: Secondary | ICD-10-CM

## 2024-01-16 DIAGNOSIS — Z79899 Other long term (current) drug therapy: Secondary | ICD-10-CM | POA: Diagnosis not present

## 2024-01-16 DIAGNOSIS — R000 Tachycardia, unspecified: Secondary | ICD-10-CM | POA: Diagnosis not present

## 2024-01-16 DIAGNOSIS — F10920 Alcohol use, unspecified with intoxication, uncomplicated: Secondary | ICD-10-CM

## 2024-01-16 DIAGNOSIS — F1090 Alcohol use, unspecified, uncomplicated: Secondary | ICD-10-CM

## 2024-01-16 MED ORDER — LORAZEPAM 1 MG PO TABS: 0.0000 mg | ORAL_TABLET | Freq: Two times a day (BID) | ORAL | Status: DC

## 2024-01-16 MED ORDER — LORAZEPAM 2 MG/ML IJ SOLN: 0.0000 mg | Freq: Four times a day (QID) | INTRAMUSCULAR | Status: DC

## 2024-01-16 MED ORDER — THIAMINE HCL 100 MG/ML IJ SOLN: 100.0000 mg | Freq: Every day | INTRAMUSCULAR | Status: DC

## 2024-01-16 MED ORDER — LORAZEPAM 1 MG PO TABS
0.0000 mg | ORAL_TABLET | Freq: Four times a day (QID) | ORAL | Status: DC
  Administered 2024-01-17: 2 mg via ORAL
  Administered 2024-01-17: 1 mg via ORAL
  Filled 2024-01-16: qty 2
  Filled 2024-01-16: qty 1

## 2024-01-16 MED ORDER — THIAMINE MONONITRATE 100 MG PO TABS
100.0000 mg | ORAL_TABLET | Freq: Every day | ORAL | Status: DC
  Administered 2024-01-17: 100 mg via ORAL
  Filled 2024-01-16: qty 1

## 2024-01-16 MED ORDER — LORAZEPAM 2 MG/ML IJ SOLN: 0.0000 mg | Freq: Two times a day (BID) | INTRAMUSCULAR | Status: DC

## 2024-01-16 NOTE — ED Triage Notes (Signed)
 Patient BIB EMS c/o alcohol detox. Patient report last time he drink was this afternoon. Denies N/V.

## 2024-01-16 NOTE — ED Provider Notes (Signed)
 [place] EMERGENCY DEPARTMENT AT [facility] Provider Note   CSN: [id number] Arrival date & time: 01/16/24  2205     History  Chief Complaint  Patient presents with   alcohol detox    Xavier Owens is a 53 y.o. male.  HPI    53 year old male comes in with chief complaint of detox.  Patient has history of alcohol use disorder.  He is currently intoxicated.  He states that his grandma passed away couple of weeks ago, he has been drinking heavily since then.  He just wants to get detox and move on.  He has no SI, HI.  Home Medications Prior to Admission medications   Medication Sig Start Date End Date Taking? Authorizing Provider  DULoxetine (CYMBALTA) 30 MG capsule Take 30 mg by mouth daily.    [provider]  folic acid (FOLVITE) 1 MG tablet Take 1 mg by mouth daily. 06/21/22   [provider]  gabapentin (NEURONTIN) 300 MG capsule Take 300 mg by mouth 3 (three) times daily as needed.    [provider]  hydrOXYzine (VISTARIL) 50 MG capsule Take 50 mg by mouth 3 (three) times daily. 07/05/22   [provider]  methocarbamol (ROBAXIN) 500 MG tablet Take 500 mg by mouth 3 (three) times daily as needed.    [provider]  naltrexone (DEPADE) 50 MG tablet Take 1 tablet (50 mg total) by mouth daily. 07/28/22   Doran Stabler, DO  ondansetron (ZOFRAN) 4 MG tablet Take 1 tablet (4 mg total) by mouth every 6 (six) hours. 08/02/22   Wynetta Fines, MD  prazosin (MINIPRESS) 1 MG capsule Take 1 mg by mouth at bedtime.    [provider]  QUEtiapine (SEROQUEL) 100 MG tablet Take 1 tablet (100 mg total) by mouth at bedtime. 07/28/22   Doran Stabler, DO  thiamine (VITAMIN B1) 100 MG tablet Take 100 mg by mouth daily. 06/21/22   [provider]  traZODone (DESYREL) 100 MG tablet Take 1 tablet (100 mg total) by mouth at bedtime. 07/28/22   Doran Stabler, DO      Allergies    Patient has no known allergies.    Review of  Systems   Review of Systems  All other systems reviewed and are negative.   Physical Exam Updated Vital Signs BP 130/85 (BP Location: Right Arm)   Pulse (!) 107   Temp 98.2 F (36.8 C) (Oral)   Resp 17   SpO2 95%  Physical Exam Vitals and nursing note reviewed.  Constitutional:      Appearance: He is well-developed.     Comments: Appears intoxicated  HENT:     Head: Atraumatic.  Cardiovascular:     Rate and Rhythm: Normal rate.  Pulmonary:     Effort: Pulmonary effort is normal.  Musculoskeletal:     Cervical back: Neck supple.  Skin:    General: Skin is warm.  Neurological:     Mental Status: He is oriented to person, place, and time.     Comments: Appears slightly confused, intoxicated  Psychiatric:        Behavior: Behavior is slowed.     ED Results / Procedures / Treatments   Labs (all labs ordered are listed, but only abnormal results are displayed) Labs Reviewed  COMPREHENSIVE METABOLIC PANEL  CBC WITH DIFFERENTIAL/PLATELET  ETHANOL    EKG None  Radiology No results found.  Procedures Procedures    Medications Ordered in ED Medications - No data  to display  ED Course/ Medical Decision Making/ A&P                                 Medical Decision Making Amount and/or Complexity of Data Reviewed Labs: ordered.   53 year old patient comes in with chief complaint of detox.  He has history of nightmares, alcohol use disorder and seizures. He is currently intoxicated.  We will monitor the patient in the ED, reassess him in he is clinically sober.  Ethanol level has been ordered.  Patient's care has been signed out to incoming team.  CIWA protocol initiated.  Final Clinical Impression(s) / ED Diagnoses Final diagnoses:  Alcohol use disorder    Rx / DC Orders ED Discharge Orders     None         Derwood Kaplan, MD 01/16/24 2337

## 2024-01-17 ENCOUNTER — Ambulatory Visit (HOSPITAL_COMMUNITY)
Admission: EM | Admit: 2024-01-17 | Discharge: 2024-01-18 | Disposition: A | Discharge status: Other Acute Inpatient Hospital | Payer: MEDICAID

## 2024-01-17 ENCOUNTER — Encounter (HOSPITAL_COMMUNITY): Payer: Self-pay | Admitting: Emergency Medicine

## 2024-01-17 ENCOUNTER — Ambulatory Visit (HOSPITAL_COMMUNITY)
Admission: EM | Admit: 2024-01-17 | Discharge: 2024-01-17 | Disposition: A | Discharge status: Other Acute Inpatient Hospital | Payer: MEDICAID | Source: Home / Self Care

## 2024-01-17 ENCOUNTER — Emergency Department (HOSPITAL_COMMUNITY)
Admission: EM | Admit: 2024-01-17 | Discharge: 2024-01-17 | Disposition: A | Discharge status: Home / Self Care | Payer: MEDICAID | Source: Home / Self Care | Attending: Emergency Medicine | Admitting: Emergency Medicine

## 2024-01-17 DIAGNOSIS — F1090 Alcohol use, unspecified, uncomplicated: Secondary | ICD-10-CM | POA: Insufficient documentation

## 2024-01-17 DIAGNOSIS — Z79899 Other long term (current) drug therapy: Secondary | ICD-10-CM | POA: Insufficient documentation

## 2024-01-17 DIAGNOSIS — F10988 Alcohol use, unspecified with other alcohol-induced disorder: Secondary | ICD-10-CM | POA: Insufficient documentation

## 2024-01-17 DIAGNOSIS — I10 Essential (primary) hypertension: Secondary | ICD-10-CM | POA: Insufficient documentation

## 2024-01-17 DIAGNOSIS — R000 Tachycardia, unspecified: Secondary | ICD-10-CM | POA: Insufficient documentation

## 2024-01-17 LAB — CBC WITH DIFFERENTIAL/PLATELET
Abs Immature Granulocytes: 0.01 10*3/uL (ref 0.00–0.07)
Basophils Absolute: 0.1 10*3/uL (ref 0.0–0.1)
Basophils Relative: 1 %
Eosinophils Absolute: 0.1 10*3/uL (ref 0.0–0.5)
Eosinophils Relative: 2 %
HCT: 33.8 % — ABNORMAL LOW (ref 39.0–52.0)
Hemoglobin: 10.9 g/dL — ABNORMAL LOW (ref 13.0–17.0)
Immature Granulocytes: 0 %
Lymphocytes Relative: 29 %
Lymphs Abs: 1 10*3/uL (ref 0.7–4.0)
MCH: 30.8 pg (ref 26.0–34.0)
MCHC: 32.2 g/dL (ref 30.0–36.0)
MCV: 95.5 fL (ref 80.0–100.0)
Monocytes Absolute: 0.4 10*3/uL (ref 0.1–1.0)
Monocytes Relative: 10 %
Neutro Abs: 2.1 10*3/uL (ref 1.7–7.7)
Neutrophils Relative %: 58 %
Platelets: 218 10*3/uL (ref 150–400)
RBC: 3.54 MIL/uL — ABNORMAL LOW (ref 4.22–5.81)
RDW: 14.8 % (ref 11.5–15.5)
WBC: 3.6 10*3/uL — ABNORMAL LOW (ref 4.0–10.5)
nRBC: 0 % (ref 0.0–0.2)

## 2024-01-17 LAB — COMPREHENSIVE METABOLIC PANEL
ALT: 27 U/L (ref 0–44)
AST: 129 U/L — ABNORMAL HIGH (ref 15–41)
Albumin: 3.8 g/dL (ref 3.5–5.0)
Alkaline Phosphatase: 108 U/L (ref 38–126)
Anion gap: 14 (ref 5–15)
BUN: 5 mg/dL — ABNORMAL LOW (ref 6–20)
CO2: 23 mmol/L (ref 22–32)
Calcium: 8.4 mg/dL — ABNORMAL LOW (ref 8.9–10.3)
Chloride: 101 mmol/L (ref 98–111)
Creatinine, Ser: 0.57 mg/dL — ABNORMAL LOW (ref 0.61–1.24)
GFR, Estimated: >60 mL/min (ref >60)
Glucose, Bld: 102 mg/dL — ABNORMAL HIGH (ref 70–99)
Potassium: 3 mmol/L — ABNORMAL LOW (ref 3.5–5.1)
Sodium: 138 mmol/L (ref 135–145)
Total Bilirubin: 0.6 mg/dL (ref 0.0–1.2)
Total Protein: 7.8 g/dL (ref 6.5–8.1)

## 2024-01-17 LAB — ETHANOL: Alcohol, Ethyl (B): 499 mg/dL (ref <10)

## 2024-01-17 MED ORDER — LORAZEPAM 1 MG PO TABS
1.0000 mg | ORAL_TABLET | Freq: Once | ORAL | Status: AC
  Administered 2024-01-17: 1 mg via ORAL
  Filled 2024-01-17: qty 1

## 2024-01-17 MED ORDER — CHLORDIAZEPOXIDE HCL 25 MG PO CAPS: ORAL_CAPSULE | ORAL | 0 refills | Status: DC

## 2024-01-17 MED ORDER — ONDANSETRON 4 MG PO TBDP
8.0000 mg | ORAL_TABLET | Freq: Once | ORAL | Status: AC
  Administered 2024-01-17: 8 mg via ORAL
  Filled 2024-01-17: qty 2

## 2024-01-17 MED ORDER — POTASSIUM CHLORIDE CRYS ER 20 MEQ PO TBCR: 20.0000 meq | EXTENDED_RELEASE_TABLET | Freq: Every day | ORAL | 0 refills | Status: DC

## 2024-01-17 MED ORDER — POTASSIUM CHLORIDE CRYS ER 20 MEQ PO TBCR
40.0000 meq | EXTENDED_RELEASE_TABLET | Freq: Once | ORAL | Status: AC
  Administered 2024-01-17: 40 meq via ORAL
  Filled 2024-01-17: qty 2

## 2024-01-17 MED ORDER — CHLORDIAZEPOXIDE HCL 25 MG PO CAPS
25.0000 mg | ORAL_CAPSULE | Freq: Once | ORAL | Status: DC
  Filled 2024-01-17: qty 1

## 2024-01-17 NOTE — Progress Notes (Signed)
   01/17/24 2227  [facility] Triage Screening (Walk-ins at [facility] only)  How Did You Hear About [place]? Self  What Is the Reason for Your Visit/Call Today? Pt presents to [facility] as a voluntary walk-in, requesting substance abuse treatment/detox. Pt was seen at [facility] earlier today for same presentation, however he was sent to ED due to feeling like he was about to have seizure-like activity and nausea. Pt has returned and reports that he last drank a fifth of vodka yesterday. Pt reports drinking daily for about 30 years. Pt currently denies SI,HI,AVH.  How Long Has This Been Causing You Problems? > than 6 months  Have You Recently Had Any Thoughts About Hurting Yourself? No  Are You Planning to Commit Suicide/Harm Yourself At This time? No  Have you Recently Had Thoughts About Hurting Someone [name]? No  Are You Planning To Harm Someone At This Time? No  Physical Abuse Denies  Verbal Abuse Denies  Sexual Abuse Denies  Exploitation of patient/patient's resources Denies  Self-Neglect Denies  Are you currently experiencing any auditory, visual or other hallucinations? No  Have You Used Any Alcohol or Drugs in the Past 24 Hours? Yes  What Did You Use and How Much? fifth of vodka  Do you have any current medical co-morbidities that require immediate attention? No  Clinician description of patient physical appearance/behavior: cooperative, calm, oriented  What Do You Feel Would Help You the Most Today? Alcohol or Drug Use Treatment  If access to [facility] was not available, would you have sought care in the Emergency Department? No  Determination of Need Urgent (48 hours)  Options For Referral Other: Comment;Facility-Based Crisis;Chemical Dependency Intensive Outpatient Therapy (CDIOP)  Determination of Need filed? Yes

## 2024-01-17 NOTE — Discharge Instructions (Addendum)
 It was a pleasure caring for you today. [facility] from [facility]. I have provided a list of inpatient substance abuse treatment centers in this discharge paperwork.

## 2024-01-17 NOTE — ED Provider Notes (Addendum)
 Care assumed from Dr. Rhunette Croft.  Patient here with alcohol intoxication.  Was requesting detox.  Will need to sober in the ED.  Observed overnight shift.  He remains somnolent but arousable.  Will need additional time to sober to ensure safe disposition plan.  Labs remarkable for alcohol intoxication and hypokalemia.  Hemoglobin has downtrended since October 2023.  Will need additional time to sober.   Glynn Octave, MD 01/17/24 1308    Glynn Octave, MD 01/17/24 [phone]

## 2024-01-17 NOTE — ED Provider Notes (Signed)
 Patient here with acute etoh intoxication ETOH nearly 500 on arrival Given ativan, also Kdur for hypokalemia Some small drop in hgb but last checked 4 years ago, no acute concern for active GI hemorrhage or bleed  Pending improved sobriety, anticipating like discharge with librium and outpatient detox resources  Physical Exam  BP 122/71   Pulse 96   Temp 98.7 F (37.1 C) (Oral)   Resp 16   SpO2 92%   Physical Exam  Procedures  Procedures  ED Course / MDM    Medical Decision Making Amount and/or Complexity of Data Reviewed Labs: ordered.  Risk OTC drugs. Prescription drug management.    Patient was reassessed after nearly 14-hour observation stay in the hospital.  He did receive 2 mg of Ativan last night and 1 mg earlier this morning.  On my assessment the patient is awake, alert, stable, no tremulousness on exam.  We reviewed his workup, anticipate he could be discharged and manage as an outpatient for relatively mild withdrawal symptoms.  I did provide resources for detox programs which he can look for.  He was also prescribed potassium for hypokalemia.  He is stable at this time for discharge.  We have offered Librium for home use for withdrawal.  He was able to tolerate p.o. in the ED and walk steadily.      Terald Sleeper, MD 01/17/24 1455

## 2024-01-17 NOTE — ED Triage Notes (Deleted)
 Patient BIB [facility] from [facility] for potential withdrawals, c/o multiple episodes of emesis.  Patient reports he was waiting too long at [facility] and needed to be seen.  Patient here this AM for same and sent to [facility] and he wants to be sent to [facility].

## 2024-01-17 NOTE — ED Provider Notes (Signed)
  EMERGENCY DEPARTMENT AT [facility] Provider Note   CSN: [id number] Arrival date & time: 01/17/24  1857     History  Chief Complaint  Patient presents with   Alcohol Problem    Xavier Owens is a 53 y.o. male with PMHx HTN, alcohol use disorder who presents to ED concerned for ETOH withdrawal. Patient was seen last night for ETOH intoxication - last ETOH drink was last night. Patient was in the ED for around 14 hours last night/today with reassuring workup and discharged with librium prescription. Patient did not pick up this prescription. Patient instead went to [facility] where he vomited and told the nurse "I feel like I am about to have a seizure" so nurse called 911 for transportation to this ED. Patient stating that he was feeling lightheaded and felt like his eyes were about to start rolling back in his head. N LOC. No vomiting since patient was in [facility].  Patient stating that he drinks 1/5th liquor daily before last night.    Alcohol Problem       Home Medications Prior to Admission medications   Medication Sig Start Date End Date Taking? Authorizing Provider  chlordiazePOXIDE (LIBRIUM) 25 MG capsule 50mg  PO TID x 1D, then 25-50mg  PO BID X 1D, then 25-50mg  PO QD X 1D 01/17/24   Valrie Hart F, [name]  DULoxetine (CYMBALTA) 30 MG capsule Take 30 mg by mouth daily.    [provider]  folic acid (FOLVITE) 1 MG tablet Take 1 mg by mouth daily. 06/21/22   [provider]  gabapentin (NEURONTIN) 300 MG capsule Take 300 mg by mouth 3 (three) times daily as needed.    [provider]  hydrOXYzine (VISTARIL) 50 MG capsule Take 50 mg by mouth 3 (three) times daily. 07/05/22   [provider]  methocarbamol (ROBAXIN) 500 MG tablet Take 500 mg by mouth 3 (three) times daily as needed.    [provider]  naltrexone (DEPADE) 50 MG tablet Take 1 tablet (50 mg total) by mouth daily. 07/28/22   Doran Stabler, DO  ondansetron  (ZOFRAN) 4 MG tablet Take 1 tablet (4 mg total) by mouth every 6 (six) hours. 08/02/22   Wynetta Fines, MD  potassium chloride SA (KLOR-CON M) 20 MEQ tablet Take 1 tablet (20 mEq total) by mouth daily for 10 days. 01/17/24 01/27/24  Terald Sleeper, MD  prazosin (MINIPRESS) 1 MG capsule Take 1 mg by mouth at bedtime.    [provider]  QUEtiapine (SEROQUEL) 100 MG tablet Take 1 tablet (100 mg total) by mouth at bedtime. 07/28/22   Doran Stabler, DO  thiamine (VITAMIN B1) 100 MG tablet Take 100 mg by mouth daily. 06/21/22   [provider]  traZODone (DESYREL) 100 MG tablet Take 1 tablet (100 mg total) by mouth at bedtime. 07/28/22   Doran Stabler, DO      Allergies    Patient has no known allergies.    Review of Systems   Review of Systems  Neurological:  Positive for light-headedness.    Physical Exam Updated Vital Signs BP (!) 163/94 (BP Location: Left Arm)   Pulse (!) 103   Temp 98.7 F (37.1 C) (Oral)   Resp 16   Wt 78 kg   SpO2 96%   BMI 25.77 kg/m  Physical Exam Vitals and nursing note reviewed.  Constitutional:      General: He is not in acute distress.    Appearance: He is not  ill-appearing or toxic-appearing.  HENT:     Head: Normocephalic and atraumatic.     Mouth/Throat:     Mouth: Mucous membranes are moist.  Eyes:     General: No scleral icterus.       Right eye: No discharge.        Left eye: No discharge.     Conjunctiva/sclera: Conjunctivae normal.  Cardiovascular:     Rate and Rhythm: Tachycardia present.     Pulses: Normal pulses.     Heart sounds: Normal heart sounds. No murmur heard. Pulmonary:     Effort: Pulmonary effort is normal. No respiratory distress.     Breath sounds: Normal breath sounds. No wheezing, rhonchi or rales.  Abdominal:     General: Abdomen is flat. Bowel sounds are normal. There is no distension.     Palpations: Abdomen is soft. There is no mass.     Tenderness: There is no abdominal tenderness.   Musculoskeletal:     Right lower leg: No edema.     Left lower leg: No edema.  Skin:    General: Skin is warm and dry.     Findings: No rash.  Neurological:     General: No focal deficit present.     Mental Status: He is alert and oriented to person, place, and time. Mental status is at baseline.     Comments: GCS 15. Speech is goal oriented. No deficits appreciated to CN III-XII; Patient ambulatory with steady gait. There is very mild shaking of the fingers - no other shaking of hands or rest of body.    Psychiatric:        Mood and Affect: Mood normal.     ED Results / Procedures / Treatments   Labs (all labs ordered are listed, but only abnormal results are displayed) Labs Reviewed - No data to display  EKG None  Radiology No results found.  Procedures Procedures    Medications Ordered in ED Medications  LORazepam (ATIVAN) tablet 1 mg (1 mg Oral Given 01/17/24 1938)    ED Course/ Medical Decision Making/ A&P                                 Medical Decision Making Risk Prescription drug management.   This patient presents to the ED for concern of ETOH withdrawals, this involves an extensive number of treatment options, and is a complaint that carries with it a high risk of complications and morbidity.  The differential diagnosis includes DT, substance abuse, seizures, intoxication   Co morbidities that complicate the patient evaluation  HTN, alcohol use disorder   Additional history obtained:  Additional history obtained from ED note from earlier today: patient with reassuring workup and discharged with librium taper.    Problem List / ED Course / Critical interventions / Medication management  Patient presents to ED concerned for ETOH withdrawals. Patient with 14 hour reassuring observation in ED earlier today. Patient has not picked up librium prescription.  Physical exam with very mild shaking of the fingers - no other concern for ataxia. No vomiting  in this ED. Patient resting comfortably in bed. Patient stating that he is nervous to go home because he was experiencing lightheadedness earlier and he is scared that he might have a seizure. Provided patient with a small dose of ativan - his very mild finger shaking resolved.  Educated patient that it is very important that he  picks up his librium prescription. Patient stating that he does not have a ride - so I asked nurse to supply patient with cab voucher. Patient's [facility] pharmacy now closed so I have [facility] Patient verbalized understanding of plan. Patient's mild tachycardia in triage has resolved.  Provided patient with list of inpatient substance use detox programs. Staffed with Dr. Theresia Lo who agrees with plan. I have reviewed the patients home medicines and have made adjustments as needed Patient afebrile with stable vitals. Provided with return precautions. Discharged in good condition.    Social Determinants of Health:  none           Final Clinical Impression(s) / ED Diagnoses Final diagnoses:  Alcohol use disorder    Rx / DC Orders ED Discharge Orders          Ordered    chlordiazePOXIDE (LIBRIUM) 25 MG capsule        01/17/24 2047              Dorthy Cooler, [name] 01/17/24 2124    Elayne Snare K, DO 01/17/24 2343

## 2024-01-17 NOTE — ED Notes (Addendum)
 Nursing called to front lobby. Pt states he believes he will have a sz. Is currently detoxing from alcohol. States he drinks 1/5 liquor daily. Please see CIWA. Patient actively vomiting. No available providers. EMS called for transport to ED

## 2024-01-17 NOTE — Progress Notes (Signed)
   01/17/24 1427  [facility] Triage Screening (Walk-ins at [facility] only)  How Did You Hear About [place]? Self  What Is the Reason for Your Visit/Call Today? Xavier Owens is a 53 year old male presenting to [facility] unaccompanied. Pt reports he has an an alcohol problem for 30 years. Pt reports he drank a 5th and pint within the last 24 hours. Pt also mentions he is drinking daily. Pt reports he is taking medication at this time. Pt does report he feels like he is in withdrawl. Pt mentions he feels like he is going to throw up everyday until he drinks. Pt has been admitted into a facility like this before. Pt is looking to detox at this time. Pt denies drug use, Si, Hi and Avh.  How Long Has This Been Causing You Problems? > than 6 months  Have You Recently Had Any Thoughts About Hurting Yourself? No  Are You Planning to Commit Suicide/Harm Yourself At This time? No  Have you Recently Had Thoughts About Hurting Someone [name]? No  Are You Planning To Harm Someone At This Time? No  Physical Abuse Denies  Verbal Abuse Denies  Sexual Abuse Denies  Exploitation of patient/patient's resources Denies  Self-Neglect Denies  Possible abuse reported to: Other (Comment)  Are you currently experiencing any auditory, visual or other hallucinations? No  Have You Used Any Alcohol or Drugs in the Past 24 Hours? Yes  What Did You Use and How Much? a 5th and pint  Do you have any current medical co-morbidities that require immediate attention? No  Clinician description of patient physical appearance/behavior: calm, cooperative  What Do You Feel Would Help You the Most Today? Alcohol or Drug Use Treatment  If access to [facility] was not available, would you have sought care in the Emergency Department? No  Determination of Need Urgent (48 hours)  Options For Referral Inpatient Hospitalization;Facility-Based Crisis  Determination of Need filed? Yes

## 2024-01-17 NOTE — ED Provider Notes (Signed)
 Patient in lobby waiting to be seen for consideration of admission to [facility].  RN reported patient has complained of nausea and reports vomiting 1 time.  Patient was discharged from the ED this morning and started on a Librium taper.  This Clinical research associate agreed to give a one-time dose of Zofran 8 mg as patient is waiting to be seen by oncoming shift provider.

## 2024-01-17 NOTE — ED Triage Notes (Addendum)
 Patient BIB [facility] from [facility] for potential withdrawals.  Patient c/o multiple episodes of emesis.  Patient reports he was waiting too long at [facility] and needed to be seen. Patient here this AM for same and sent to [facility].  Patient states he wants to be sent to [facility].  Patient drinks daily, last drink yesterday.

## 2024-01-17 NOTE — ED Provider Triage Note (Signed)
 Emergency Medicine Provider Triage Evaluation Note  Xavier Owens , a 53 y.o. male  was evaluated in triage.  Pt complains of ETOH withdrawal.  Review of Systems  Positive:  Negative:   Physical Exam  BP (!) 163/94 (BP Location: Left Arm)   Pulse (!) 103   Temp 98.7 F (37.1 C) (Oral)   Resp 16   Wt 78 kg   SpO2 96%   BMI 25.77 kg/m  Gen:   Awake, no distress   Resp:  Normal effort  MSK:   Moves extremities without difficulty  Other:    Medical Decision Making  Medically screening exam initiated at 7:53 PM.  Appropriate orders placed.  Xavier Owens was informed that the remainder of the evaluation will be completed by another provider, this initial triage assessment does not replace that evaluation, and the importance of remaining in the ED until their evaluation is complete.  Patient d/cd earlier today for ETOH intoxication. Stating "I dont need to be along". [facility] sent patient over here since he thought that he was about to have a seizure. Patient stating that he was having vomiting and feels lightheaded. Patient with mild shaking of hands - provided him with PO ativan in triage. Patient has not picked up librium prescription because he does not have a car to drive.    Dorthy Cooler, [place] 01/17/24 Corky Crafts

## 2024-01-18 ENCOUNTER — Other Ambulatory Visit (HOSPITAL_COMMUNITY)
Admission: EM | Admit: 2024-01-18 | Discharge: 2024-01-23 | Disposition: A | Discharge status: Other Acute Inpatient Hospital | Payer: MEDICAID | Source: Home / Self Care | Admitting: Nurse Practitioner

## 2024-01-18 DIAGNOSIS — F10229 Alcohol dependence with intoxication, unspecified: Secondary | ICD-10-CM | POA: Insufficient documentation

## 2024-01-18 DIAGNOSIS — I10 Essential (primary) hypertension: Secondary | ICD-10-CM | POA: Diagnosis not present

## 2024-01-18 DIAGNOSIS — M25561 Pain in right knee: Secondary | ICD-10-CM | POA: Diagnosis not present

## 2024-01-18 DIAGNOSIS — R569 Unspecified convulsions: Secondary | ICD-10-CM | POA: Diagnosis not present

## 2024-01-18 DIAGNOSIS — R251 Tremor, unspecified: Secondary | ICD-10-CM | POA: Insufficient documentation

## 2024-01-18 DIAGNOSIS — G250 Essential tremor: Secondary | ICD-10-CM | POA: Diagnosis not present

## 2024-01-18 DIAGNOSIS — E876 Hypokalemia: Secondary | ICD-10-CM | POA: Insufficient documentation

## 2024-01-18 DIAGNOSIS — Y908 Blood alcohol level of 240 mg/100 ml or more: Secondary | ICD-10-CM | POA: Diagnosis not present

## 2024-01-18 DIAGNOSIS — F10939 Alcohol use, unspecified with withdrawal, unspecified: Secondary | ICD-10-CM | POA: Diagnosis present

## 2024-01-18 DIAGNOSIS — F1914 Other psychoactive substance abuse with psychoactive substance-induced mood disorder: Secondary | ICD-10-CM | POA: Diagnosis not present

## 2024-01-18 DIAGNOSIS — F1020 Alcohol dependence, uncomplicated: Secondary | ICD-10-CM | POA: Diagnosis present

## 2024-01-18 DIAGNOSIS — F515 Nightmare disorder: Secondary | ICD-10-CM

## 2024-01-18 DIAGNOSIS — W07XXXA Fall from chair, initial encounter: Secondary | ICD-10-CM | POA: Diagnosis not present

## 2024-01-18 LAB — COMPREHENSIVE METABOLIC PANEL
ALT: 23 U/L (ref 0–44)
AST: 68 U/L — ABNORMAL HIGH (ref 15–41)
Albumin: 3.7 g/dL (ref 3.5–5.0)
Alkaline Phosphatase: 111 U/L (ref 38–126)
Anion gap: 17 — ABNORMAL HIGH (ref 5–15)
BUN: <5 mg/dL — ABNORMAL LOW (ref 6–20)
CO2: 26 mmol/L (ref 22–32)
Calcium: 9.2 mg/dL (ref 8.9–10.3)
Chloride: 95 mmol/L — ABNORMAL LOW (ref 98–111)
Creatinine, Ser: 0.76 mg/dL (ref 0.61–1.24)
GFR, Estimated: >60 mL/min (ref >60)
Glucose, Bld: 94 mg/dL (ref 70–99)
Potassium: 3.7 mmol/L (ref 3.5–5.1)
Sodium: 138 mmol/L (ref 135–145)
Total Bilirubin: 1.4 mg/dL — ABNORMAL HIGH (ref 0.0–1.2)
Total Protein: 7.9 g/dL (ref 6.5–8.1)

## 2024-01-18 LAB — POCT URINE DRUG SCREEN - MANUAL ENTRY (I-SCREEN)
POC Amphetamine UR: NOT DETECTED
POC Buprenorphine (BUP): NOT DETECTED
POC Cocaine UR: NOT DETECTED
POC Marijuana UR: POSITIVE — AB
POC Methadone UR: NOT DETECTED
POC Methamphetamine UR: NOT DETECTED
POC Morphine: NOT DETECTED
POC Oxazepam (BZO): POSITIVE — AB
POC Oxycodone UR: NOT DETECTED
POC Secobarbital (BAR): NOT DETECTED

## 2024-01-18 LAB — ETHANOL: Alcohol, Ethyl (B): <10 mg/dL (ref <10)

## 2024-01-18 MED ORDER — TRAZODONE HCL 100 MG PO TABS
100.0000 mg | ORAL_TABLET | Freq: Every day | ORAL | Status: DC
  Administered 2024-01-18 – 2024-01-21 (×4): 100 mg via ORAL
  Filled 2024-01-18 (×4): qty 1

## 2024-01-18 MED ORDER — DULOXETINE HCL 20 MG PO CPEP
20.0000 mg | ORAL_CAPSULE | Freq: Every day | ORAL | Status: DC
  Administered 2024-01-18 – 2024-01-23 (×6): 20 mg via ORAL
  Filled 2024-01-18 (×6): qty 1

## 2024-01-18 MED ORDER — LOPERAMIDE HCL 2 MG PO CAPS: 2.0000 mg | ORAL_CAPSULE | ORAL | Status: DC | PRN

## 2024-01-18 MED ORDER — ONDANSETRON 4 MG PO TBDP: 4.0000 mg | ORAL_TABLET | Freq: Four times a day (QID) | ORAL | Status: DC | PRN

## 2024-01-18 MED ORDER — MAGNESIUM HYDROXIDE 400 MG/5ML PO SUSP: 30.0000 mL | Freq: Every day | ORAL | Status: DC | PRN

## 2024-01-18 MED ORDER — HYDROXYZINE HCL 25 MG PO TABS
25.0000 mg | ORAL_TABLET | Freq: Four times a day (QID) | ORAL | Status: DC | PRN
  Administered 2024-01-19 – 2024-01-20 (×3): 25 mg via ORAL
  Filled 2024-01-18 (×3): qty 1

## 2024-01-18 MED ORDER — LORAZEPAM 1 MG PO TABS
2.0000 mg | ORAL_TABLET | Freq: Four times a day (QID) | ORAL | Status: AC
  Administered 2024-01-18 (×3): 2 mg via ORAL
  Filled 2024-01-18 (×3): qty 2

## 2024-01-18 MED ORDER — LORAZEPAM 1 MG PO TABS
2.0000 mg | ORAL_TABLET | Freq: Three times a day (TID) | ORAL | Status: DC
  Administered 2024-01-19: 2 mg via ORAL
  Filled 2024-01-18: qty 2

## 2024-01-18 MED ORDER — LORAZEPAM 1 MG PO TABS: 1.0000 mg | ORAL_TABLET | Freq: Two times a day (BID) | ORAL | Status: DC

## 2024-01-18 MED ORDER — GABAPENTIN 300 MG PO CAPS
600.0000 mg | ORAL_CAPSULE | Freq: Three times a day (TID) | ORAL | Status: DC
  Administered 2024-01-18 – 2024-01-19 (×3): 600 mg via ORAL
  Filled 2024-01-18 (×3): qty 2

## 2024-01-18 MED ORDER — LORAZEPAM 1 MG PO TABS
1.0000 mg | ORAL_TABLET | Freq: Four times a day (QID) | ORAL | Status: DC
  Administered 2024-01-18: 1 mg via ORAL

## 2024-01-18 MED ORDER — LORAZEPAM 1 MG PO TABS: 1.0000 mg | ORAL_TABLET | Freq: Every day | ORAL | Status: DC

## 2024-01-18 MED ORDER — LORAZEPAM 1 MG PO TABS
2.0000 mg | ORAL_TABLET | Freq: Every day | ORAL | Status: DC
Start: 2024-01-21 — End: 2024-01-19

## 2024-01-18 MED ORDER — DIPHENHYDRAMINE HCL 50 MG/ML IJ SOLN: 50.0000 mg | Freq: Three times a day (TID) | INTRAMUSCULAR | Status: DC | PRN

## 2024-01-18 MED ORDER — DIPHENHYDRAMINE HCL 50 MG PO CAPS
50.0000 mg | ORAL_CAPSULE | Freq: Three times a day (TID) | ORAL | Status: DC | PRN
Start: 2024-01-18 — End: 2024-01-23
  Administered 2024-01-22: 50 mg via ORAL
  Filled 2024-01-18: qty 1

## 2024-01-18 MED ORDER — LORAZEPAM 1 MG PO TABS
1.0000 mg | ORAL_TABLET | Freq: Four times a day (QID) | ORAL | Status: DC
  Filled 2024-01-18: qty 1

## 2024-01-18 MED ORDER — GABAPENTIN 300 MG PO CAPS
300.0000 mg | ORAL_CAPSULE | Freq: Three times a day (TID) | ORAL | Status: DC
  Filled 2024-01-18: qty 1

## 2024-01-18 MED ORDER — LORAZEPAM 2 MG/ML IJ SOLN
2.0000 mg | Freq: Three times a day (TID) | INTRAMUSCULAR | Status: DC | PRN
  Filled 2024-01-18: qty 1

## 2024-01-18 MED ORDER — ADULT MULTIVITAMIN W/MINERALS CH
1.0000 | ORAL_TABLET | Freq: Every day | ORAL | Status: DC
  Administered 2024-01-18 – 2024-01-23 (×6): 1 via ORAL
  Filled 2024-01-18 (×6): qty 1

## 2024-01-18 MED ORDER — LORAZEPAM 1 MG PO TABS: 1.0000 mg | ORAL_TABLET | Freq: Three times a day (TID) | ORAL | Status: DC

## 2024-01-18 MED ORDER — HALOPERIDOL 5 MG PO TABS: 5.0000 mg | ORAL_TABLET | Freq: Three times a day (TID) | ORAL | Status: DC | PRN

## 2024-01-18 MED ORDER — ALUM & MAG HYDROXIDE-SIMETH 200-200-20 MG/5ML PO SUSP: 30.0000 mL | ORAL | Status: DC | PRN

## 2024-01-18 MED ORDER — PROPRANOLOL HCL 10 MG PO TABS: 10.0000 mg | ORAL_TABLET | Freq: Two times a day (BID) | ORAL | Status: DC

## 2024-01-18 MED ORDER — POTASSIUM CHLORIDE CRYS ER 20 MEQ PO TBCR
40.0000 meq | EXTENDED_RELEASE_TABLET | Freq: Every day | ORAL | Status: DC
  Administered 2024-01-18: 40 meq via ORAL
  Filled 2024-01-18: qty 2

## 2024-01-18 MED ORDER — LORAZEPAM 1 MG PO TABS: 2.0000 mg | ORAL_TABLET | Freq: Two times a day (BID) | ORAL | Status: DC

## 2024-01-18 MED ORDER — GABAPENTIN 300 MG PO CAPS
300.0000 mg | ORAL_CAPSULE | Freq: Three times a day (TID) | ORAL | Status: DC
Start: 2024-01-18 — End: 2024-01-18
  Administered 2024-01-18: 300 mg via ORAL

## 2024-01-18 MED ORDER — THIAMINE HCL 100 MG/ML IJ SOLN
100.0000 mg | Freq: Once | INTRAMUSCULAR | Status: AC
  Administered 2024-01-18: 100 mg via INTRAMUSCULAR
  Filled 2024-01-18: qty 2

## 2024-01-18 MED ORDER — THIAMINE MONONITRATE 100 MG PO TABS
100.0000 mg | ORAL_TABLET | Freq: Every day | ORAL | Status: DC
  Administered 2024-01-19 – 2024-01-23 (×5): 100 mg via ORAL
  Filled 2024-01-18 (×5): qty 1

## 2024-01-18 MED ORDER — HALOPERIDOL LACTATE 5 MG/ML IJ SOLN: 10.0000 mg | Freq: Three times a day (TID) | INTRAMUSCULAR | Status: DC | PRN

## 2024-01-18 MED ORDER — QUETIAPINE FUMARATE 100 MG PO TABS
100.0000 mg | ORAL_TABLET | Freq: Every day | ORAL | Status: DC
  Administered 2024-01-18 – 2024-01-22 (×5): 100 mg via ORAL
  Filled 2024-01-18 (×5): qty 1

## 2024-01-18 MED ORDER — ACETAMINOPHEN 325 MG PO TABS
650.0000 mg | ORAL_TABLET | Freq: Four times a day (QID) | ORAL | Status: DC | PRN
  Administered 2024-01-19 – 2024-01-20 (×3): 650 mg via ORAL
  Filled 2024-01-18 (×3): qty 2

## 2024-01-18 MED ORDER — LORAZEPAM 1 MG PO TABS
1.0000 mg | ORAL_TABLET | Freq: Four times a day (QID) | ORAL | Status: DC | PRN
  Administered 2024-01-18 (×2): 1 mg via ORAL
  Filled 2024-01-18 (×2): qty 1

## 2024-01-18 MED ORDER — HALOPERIDOL LACTATE 5 MG/ML IJ SOLN: 5.0000 mg | Freq: Three times a day (TID) | INTRAMUSCULAR | Status: DC | PRN

## 2024-01-18 MED ORDER — LORAZEPAM 2 MG/ML IJ SOLN: 2.0000 mg | Freq: Three times a day (TID) | INTRAMUSCULAR | Status: DC | PRN

## 2024-01-18 NOTE — ED Provider Notes (Signed)
 Behavioral Health Progress Note  Date and Time: 01/18/2024 8:35 AM Name: Xavier Owens MRN:  [id number]  Reason for admission: Sahand Gosch is a 53 year old male with psychiatric history of alcohol abuse, substance-induced mood disorder, and medical history of seizures, elevated liver enzymes and essential hypertension, who presented voluntarily to [facility] requesting alcohol abuse treatment/detox.  UDS is positive for benzodiazepines and cannabis.  Subjective:   Patient has CIWA of 8 at 6:59 AM this morning. Patient reports he drinks up to 1/5 of liquor daily, has been drinking heavily for the past 30 years.  He has a prior history of withdrawal induced seizures, with last seizure occurring 3 years ago.  He reports his longest period of sobriety was 2 years ago, a period that lasted 30 days while the patient was in a residential rehab program named [facility] in [place].  He reports he completed this program, but has not been to any other programs since.  He reports he currently does not have any outpatient follow-up.  Reports no other illicit substance being used.  He reports no legal charges or upcoming court dates, and currently lives in [place].  He identifies social support from his wife, who he has been married to for the past 14 years.  He has 2 adult children, ages 74 and 55, and reports he has a good relationship with them.  Patient's goals at discharge are to follow-up with outpatient services, as he needs to return to work and will not be able to complete the programming for a CD IOP.  He currently works at [employer] Sunday to Thursday, reports he has had stable employment for the past 3 years there.  Diagnosis:  Final diagnoses:  Alcohol use disorder, severe, dependence (HCC)    Total Time spent with patient: 1.5 hours  Substance Use History: He reports history of alcohol abuse for the past 30 years and drinks 1/5th of Vodka daily, last drink yesterday.  He  reports a history of withdrawal seizures and DT's.   Past Psychiatric History:  He is currently not established with an outpatient psychiatrist or therapist.  No formal past psychiatric history Past Medical History: No significant past medical history.  No Family History: Patient Family Psychiatric  History: Unknown to patient Social History: See above   Pain Medications: See MAR Prescriptions: See MAR Over the Counter: See MAR History of alcohol / drug use?: Yes Longest period of sobriety (when/how long): Pt reports, 30 days. Negative Consequences of Use: Legal Withdrawal Symptoms: Tremors Name of Substance 1: Alcohol. 1 - Age of First Use: 15. 1 - Amount (size/oz): Pt reports, drinking a fifth of Vodka Tueday (01/16/2024) 1 - Frequency: Pt reports, he drinks daily. 1 - Duration: Ongoing. 1 - Last Use / Amount: Tuesday (01/16/2024). 1 - Method of Aquiring: Purchase 1- Route of Use: Oral.     Current Medications:  Current Facility-Administered Medications  Medication Dose Route Frequency Provider Last Rate Last Admin   acetaminophen (TYLENOL) tablet 650 mg  650 mg Oral Q6H PRN Onuoha, Chinwendu V, NP       alum & mag hydroxide-simeth (MAALOX/MYLANTA) 200-200-20 MG/5ML suspension 30 mL  30 mL Oral Q4H PRN Onuoha, Chinwendu V, NP       haloperidol (HALDOL) tablet 5 mg  5 mg Oral TID PRN Onuoha, Chinwendu V, NP       And   diphenhydrAMINE (BENADRYL) capsule 50 mg  50 mg Oral TID PRN Onuoha, Chinwendu V, NP  haloperidol lactate (HALDOL) injection 5 mg  5 mg Intramuscular TID PRN Onuoha, Chinwendu V, NP       And   diphenhydrAMINE (BENADRYL) injection 50 mg  50 mg Intramuscular TID PRN Onuoha, Chinwendu V, NP       And   LORazepam (ATIVAN) injection 2 mg  2 mg Intramuscular TID PRN Onuoha, Chinwendu V, NP       haloperidol lactate (HALDOL) injection 10 mg  10 mg Intramuscular TID PRN Onuoha, Chinwendu V, NP       And   diphenhydrAMINE (BENADRYL) injection 50 mg  50 mg  Intramuscular TID PRN Onuoha, Chinwendu V, NP       And   LORazepam (ATIVAN) injection 2 mg  2 mg Intramuscular TID PRN Onuoha, Chinwendu V, NP       DULoxetine (CYMBALTA) DR capsule 20 mg  20 mg Oral Daily Onuoha, Chinwendu V, NP       gabapentin (NEURONTIN) capsule 300 mg  300 mg Oral TID Onuoha, Chinwendu V, NP       hydrOXYzine (ATARAX) tablet 25 mg  25 mg Oral Q6H PRN Onuoha, Chinwendu V, NP       loperamide (IMODIUM) capsule 2-4 mg  2-4 mg Oral PRN Onuoha, Chinwendu V, NP       LORazepam (ATIVAN) tablet 1 mg  1 mg Oral Q6H PRN Onuoha, Chinwendu V, NP   1 mg at 01/18/24 0125   LORazepam (ATIVAN) tablet 1 mg  1 mg Oral QID Onuoha, Chinwendu V, NP       Followed by   Melene Muller ON 01/19/2024] LORazepam (ATIVAN) tablet 1 mg  1 mg Oral TID Onuoha, Chinwendu V, NP       Followed by   Melene Muller ON 01/20/2024] LORazepam (ATIVAN) tablet 1 mg  1 mg Oral BID Onuoha, Chinwendu V, NP       Followed by   Melene Muller ON 01/21/2024] LORazepam (ATIVAN) tablet 1 mg  1 mg Oral Daily Onuoha, Chinwendu V, NP       magnesium hydroxide (MILK OF MAGNESIA) suspension 30 mL  30 mL Oral Daily PRN Onuoha, Chinwendu V, NP       multivitamin with minerals tablet 1 tablet  1 tablet Oral Daily Onuoha, Chinwendu V, NP       ondansetron (ZOFRAN-ODT) disintegrating tablet 4 mg  4 mg Oral Q6H PRN Onuoha, Chinwendu V, NP       [START ON 01/19/2024] thiamine (VITAMIN B1) tablet 100 mg  100 mg Oral Daily Onuoha, Chinwendu V, NP       Current Outpatient Medications  Medication Sig Dispense Refill   chlordiazePOXIDE (LIBRIUM) 25 MG capsule 50mg  PO TID x 1D, then 25-50mg  PO BID X 1D, then 25-50mg  PO QD X 1D 10 capsule 0   DULoxetine (CYMBALTA) 30 MG capsule Take 30 mg by mouth daily.     folic acid (FOLVITE) 1 MG tablet Take 1 mg by mouth daily.     gabapentin (NEURONTIN) 300 MG capsule Take 300 mg by mouth 3 (three) times daily as needed.     hydrOXYzine (VISTARIL) 50 MG capsule Take 50 mg by mouth 3 (three) times daily.     methocarbamol  (ROBAXIN) 500 MG tablet Take 500 mg by mouth 3 (three) times daily as needed.     naltrexone (DEPADE) 50 MG tablet Take 1 tablet (50 mg total) by mouth daily. 90 tablet 1   ondansetron (ZOFRAN) 4 MG tablet Take 1 tablet (4 mg total) by mouth every 6 (  six) hours. 12 tablet 0   potassium chloride SA (KLOR-CON M) 20 MEQ tablet Take 1 tablet (20 mEq total) by mouth daily for 10 days. 10 tablet 0   prazosin (MINIPRESS) 1 MG capsule Take 1 mg by mouth at bedtime.     QUEtiapine (SEROQUEL) 100 MG tablet Take 1 tablet (100 mg total) by mouth at bedtime. 90 tablet 1   thiamine (VITAMIN B1) 100 MG tablet Take 100 mg by mouth daily.     traZODone (DESYREL) 100 MG tablet Take 1 tablet (100 mg total) by mouth at bedtime. 90 tablet 1    Labs  Lab Results:  Admission on 01/18/2024  Component Date Value Ref Range Status   POC Amphetamine UR 01/18/2024 None Detected  NONE DETECTED (Cut Off Level 1000 ng/mL) Final   POC Secobarbital (BAR) 01/18/2024 None Detected  NONE DETECTED (Cut Off Level 300 ng/mL) Final   POC Buprenorphine (BUP) 01/18/2024 None Detected  NONE DETECTED (Cut Off Level 10 ng/mL) Final   POC Oxazepam (BZO) 01/18/2024 Positive (A)  NONE DETECTED (Cut Off Level 300 ng/mL) Final   POC Cocaine UR 01/18/2024 None Detected  NONE DETECTED (Cut Off Level 300 ng/mL) Final   POC Methamphetamine UR 01/18/2024 None Detected  NONE DETECTED (Cut Off Level 1000 ng/mL) Final   POC Morphine 01/18/2024 None Detected  NONE DETECTED (Cut Off Level 300 ng/mL) Final   POC Methadone UR 01/18/2024 None Detected  NONE DETECTED (Cut Off Level 300 ng/mL) Final   POC Oxycodone UR 01/18/2024 None Detected  NONE DETECTED (Cut Off Level 100 ng/mL) Final   POC Marijuana UR 01/18/2024 Positive (A)  NONE DETECTED (Cut Off Level 50 ng/mL) Final  Admission on 01/16/2024, Discharged on 01/17/2024  Component Date Value Ref Range Status   Sodium 01/17/2024 138  135 - 145 mmol/L Final   Potassium 01/17/2024 3.0 (L)  3.5 - 5.1  mmol/L Final   Chloride 01/17/2024 101  98 - 111 mmol/L Final   CO2 01/17/2024 23  22 - 32 mmol/L Final   Glucose, Bld 01/17/2024 102 (H)  70 - 99 mg/dL Final   Glucose reference range applies only to samples taken after fasting for at least 8 hours.   BUN 01/17/2024 5 (L)  6 - 20 mg/dL Final   Creatinine, Ser 01/17/2024 0.57 (L)  0.61 - 1.24 mg/dL Final   Calcium 32/44/0102 8.4 (L)  8.9 - 10.3 mg/dL Final   Total Protein 72/53/6644 7.8  6.5 - 8.1 g/dL Final   Albumin 03/47/4259 3.8  3.5 - 5.0 g/dL Final   AST 56/38/7564 129 (H)  15 - 41 U/L Final   ALT 01/17/2024 27  0 - 44 U/L Final   Alkaline Phosphatase 01/17/2024 108  38 - 126 U/L Final   Total Bilirubin 01/17/2024 0.6  0.0 - 1.2 mg/dL Final   GFR, Estimated 01/17/2024 >60  >60 mL/min Final   Comment: (NOTE) Calculated using the CKD-EPI Creatinine Equation (2021)    Anion gap 01/17/2024 14  5 - 15 Final   Performed at [facility], [address] [place], [place] [address]   WBC 01/17/2024 3.6 (L)  4.0 - 10.5 K/uL Final   RBC 01/17/2024 3.54 (L)  4.22 - 5.81 MIL/uL Final   Hemoglobin 01/17/2024 10.9 (L)  13.0 - 17.0 g/dL Final   HCT 18/84/1660 33.8 (L)  39.0 - 52.0 % Final   MCV 01/17/2024 95.5  80.0 - 100.0 fL Final   MCH 01/17/2024 30.8  26.0 -  34.0 pg Final   MCHC 01/17/2024 32.2  30.0 - 36.0 g/dL Final   RDW 16/08/9603 14.8  11.5 - 15.5 % Final   Platelets 01/17/2024 218  150 - 400 K/uL Final   nRBC 01/17/2024 0.0  0.0 - 0.2 % Final   Neutrophils Relative % 01/17/2024 58  % Final   Neutro Abs 01/17/2024 2.1  1.7 - 7.7 K/uL Final   Lymphocytes Relative 01/17/2024 29  % Final   Lymphs Abs 01/17/2024 1.0  0.7 - 4.0 K/uL Final   Monocytes Relative 01/17/2024 10  % Final   Monocytes Absolute 01/17/2024 0.4  0.1 - 1.0 K/uL Final   Eosinophils Relative 01/17/2024 2  % Final   Eosinophils Absolute 01/17/2024 0.1  0.0 - 0.5 K/uL Final   Basophils Relative 01/17/2024 1  % Final   Basophils Absolute 01/17/2024  0.1  0.0 - 0.1 K/uL Final   Immature Granulocytes 01/17/2024 0  % Final   Abs Immature Granulocytes 01/17/2024 0.01  0.00 - 0.07 K/uL Final   Performed at [facility], [address] [place], [place] [address]   Alcohol, Ethyl (B) 01/17/2024 499 (HH)  <10 mg/dL Final   Comment: CRITICAL RESULT CALLED TO, READ BACK BY AND VERIFIED WITH MAYWEATHER, S. RN AT 0132 ON 3.5.25. FA (NOTE) Lowest detectable limit for serum alcohol is 10 mg/dL.  For medical purposes only. Performed at [facility], [address] [place], [place] [address]     Blood Alcohol level:  Lab Results  Component Value Date   ETH 499 [facility]) 01/17/2024   ETH <10 08/16/2022    Metabolic Disorder Labs: Lab Results  Component Value Date   HGBA1C 5.0 12/09/2021   MPG 97 12/09/2021   No results found for: "PROLACTIN" Lab Results  Component Value Date   CHOL 179 12/09/2021   TRIG 90 12/09/2021   HDL 44 12/09/2021   CHOLHDL 4.1 12/09/2021   VLDL 18 12/09/2021   LDLCALC 117 (H) 12/09/2021    Therapeutic Lab Levels: No results found for: "LITHIUM" No results found for: "VALPROATE" No results found for: "CBMZ"  Physical Findings   AIMS    Flowsheet Row [facility]  AIMS Total Score 0      AUDIT    Flowsheet Row Ad[facility] (Discharged[facility]  Alcohol Use Disorder Identification Test Final Score (AUDIT) 29      CAGE-AID    Flowsheet Row ED to Hosp-Admission (Discharged) from 06/22/2021 in [facility]  CAGE-AID Score 3      PHQ2-9    Flowsheet Row ED from 01/18/2024 in [facility] Office Visit from 06/10/2022 in [facility] - A Dept Of [place]. [facility] Office Visit from 12/21/2021 in [facility] - A Dept Of [place]. [facility]  ED from 12/07/2021 in [facility] Emergency Department at [facility] Office Visit from 06/30/2021 in [facility] - A Dept Of [place]. [facility]  PHQ-2 Total Score 3 0 0 1 0  PHQ-9 Total Score 15 -- 3 6 2       Flowsheet Row ED from 01/18/2024 in [facility] Most recent reading at 01/18/2024  3:05 AM ED from 01/17/2024 in [facility] Most recent reading at 01/17/2024 10:42 PM ED from  01/17/2024 in [facility] Emergency Department at [facility] Most recent reading at 01/17/2024  7:05 PM  C-SSRS RISK CATEGORY No Risk Error: Question 2 not populated No Risk        Musculoskeletal  Strength & Muscle Tone: within normal limits Gait & Station: normal Patient leans: N/A  Psychiatric Specialty Exam  Presentation  General Appearance:  Casual  Eye Contact: Fair  Speech: Clear and Coherent  Speech Volume: Normal  Handedness: Not assessed   Mood and Affect  Mood: Anxious  Affect: Congruent   Thought Process  Thought Processes: Goal Directed; Coherent  Descriptions of Associations:Intact  Orientation:Full (Time, Place and Person)  Thought Content:WDL  Diagnosis of Schizophrenia or Schizoaffective disorder in past: No    Hallucinations:Hallucinations: None  Ideas of Reference:None  Suicidal Thoughts:Suicidal Thoughts: No  Homicidal Thoughts:Homicidal Thoughts: No   Sensorium  Memory: Immediate Fair  Judgment: Poor  Insight: Lacking   Executive Functions  Concentration: Fair  Attention Span: Fair  Recall: [employer] of Knowledge: Fair  Language: Fair   Psychomotor Activity  Psychomotor Activity: Psychomotor Activity: Tremor   Assets  Assets: Communication Skills; Desire for Improvement   Sleep  Sleep: Sleep: Poor   Nutritional Assessment (For OBS and FBC admissions only) Has the patient had a weight loss or gain of 10 pounds or more  in the last 3 months?: No Has the patient had a decrease in food intake/or appetite?: No Does the patient have dental problems?: No Does the patient have eating habits or behaviors that may be indicators of an eating disorder including binging or inducing vomiting?: No Has the patient recently lost weight without trying?: 0 Has the patient been eating poorly because of a decreased appetite?: 0 Malnutrition Screening Tool Score: 0    Physical Exam  Physical Exam Vitals and nursing note reviewed.  Constitutional:      General: He is not in acute distress.    Appearance: He is not ill-appearing.  HENT:     Head: Normocephalic and atraumatic.  Eyes:     Extraocular Movements: Extraocular movements intact.     Conjunctiva/sclera: Conjunctivae normal.  Pulmonary:     Effort: Pulmonary effort is normal. No respiratory distress.  Skin:    General: Skin is warm and dry.  Neurological:     General: No focal deficit present.    Review of Systems  All other systems reviewed and are negative.  Blood pressure 132/86, pulse 98, temperature 98.8 F (37.1 C), temperature source Oral, resp. rate 18, SpO2 98%. There is no height or weight on file to calculate BMI.  Treatment Plan Summary: Daily contact with patient to assess and evaluate symptoms and progress in treatment and Medication management  Alcohol use disorder Alcohol Use Disorder -Modified Ativan taper -Gabapentin 600 mg 3 times daily, with plan to taper this off by discharge -CIWA with Ativan/ as needed for CIWA greater than 10 -Thiamine 100 mg IM first day and PO after that -Multivitamin with minerals daily -Tylenol 650 mg every 6 hours as needed for pain -Zofran 4 mg every 6 hours as needed for nausea or vomiting -Imodium 2 to 4 mg as needed for diarrhea or loose stools  -Maalox/Mylanta 30 mL every 4 hours as needed for indigestion -Milk of Mag 30 mL as needed for constipation   Depression versus substance-induced mood  disorder Cymbalta 20 mg daily Seroquel 100 mg nightly Trazodone 100 mg nightly  CMP showing hypokalemia 3.0 --repeat CMP showing potassium 3.7 status  post 1 X40 mill equivalents of KCl Elevated LFTs with AST 129, ALT 27   Signed: Lorri Frederick, MD 01/18/2024 8:35 AM

## 2024-01-18 NOTE — ED Notes (Signed)
 Pt was provided dinner.

## 2024-01-18 NOTE — ED Notes (Signed)
 Pt BP is 147/92. [place] NP Notified.

## 2024-01-18 NOTE — ED Notes (Signed)
 Pt was provided lunch

## 2024-01-18 NOTE — ED Provider Notes (Signed)
 Facility Based Crisis Admission H&P  Date: 01/18/24 Patient Name: Xavier Owens MRN: [id number] Chief Complaint: "I need detox from alcohol".  Diagnoses:  Final diagnoses:  Alcohol use disorder, severe, dependence (HCC)    HPI: Brees Hounshell is a 53 year old male with psychiatric history of alcohol abuse, substance-induced mood disorder, and medical history of seizures, elevated liver enzymes and essential hypertension, who presented voluntarily to [facility] requesting alcohol abuse treatment/detox  Patient was seen face-to-face by this provider and chart reviewed with Dr. Woodroe Mode.  Per chart review, patient presented to [facility] yesterday with alcohol intoxication and requesting detox. ETOH level 500 on admission.  Patient was observed for nearly 14 hours and was stabilized and discharged with librium prescription and given resources for outpatient detox programs.  Patient did not pick up the Librium prescription instead, he presented to the [facility] requesting admission to [facility] for detox.   While in the lobby, the patient began experiencing acute withdrawal symptoms  with seizure-like onset and was transferred to the ED via EMS. Patient was stabilized at the ED, with reminders to pick up his Librium prescription, intpatient substance abuse treatment resources provided and he was discharged. Patient then returned voluntarily to the [facility] requesting alcohol detox.  On approach, patient is anxious appearing with visible tremors on both hands.  He denies current withdrawal symptoms characterized by copious amounts of vomiting and seizures. He reports a history of withdrawal seizures and DT's.  He reports history of alcohol abuse for the past 30 years and drinks 1/5th of Vodka daily, last drink yesterday.  He reports poor appetite and sleep. He reports home medications as Seroquel, trazodone, gabapentin and Atarax but unsure when he last took these pills.  He is currently not established with an  outpatient psychiatrist or therapist.  Discussed recommendation for admission to the [facility] for alcohol detox/treatment.  Discussed FBC milieu and expectations. Discussed CIWA protocol.  Patient completed the PHQ-9 questionnaire and obtained a total score of 15, indicating moderately severe depression.  Patient had trialed Cymbalta in the past. Will reorder.    PHQ 2-9:  Flowsheet Row ED from 01/18/2024 in [facility] Office Visit from 12/21/2021 in [facility] ED from 12/07/2021 in [facility] Emergency Department at [facility]  Thoughts that you would be better off dead, or of hurting yourself in some way Not at all Not at all Several days  PHQ-9 Total Score 15 3 6        Flowsheet Row ED from 01/18/2024 in [facility] Most recent reading at 01/18/2024  2:01 AM ED from 01/17/2024 in [facility] Most recent reading at 01/17/2024 10:42 PM ED from 01/17/2024 in [facility] Emergency Department at [facility] Most recent reading at 01/17/2024  7:05 PM  C-SSRS RISK CATEGORY No Risk Error: Question 2 not populated No Risk       Screenings    Flowsheet Row Most Recent Value  CIWA-Ar Total 11       Total Time spent with patient: 30 minutes  Musculoskeletal  Strength & Muscle Tone: within normal limits Gait & Station: normal Patient leans: N/A  Psychiatric Specialty Exam  Presentation General Appearance:  Casual  Eye Contact: Fair  Speech: Clear and Coherent  Speech Volume: Normal  Handedness: Right   Mood and Affect  Mood: Anxious  Affect: Congruent   Thought Process  Thought Processes: Goal  Directed; Coherent  Descriptions of Associations:Intact  Orientation:Full (Time, Place and Person)  Thought Content:WDL  Diagnosis of Schizophrenia or Schizoaffective disorder in past: No    Hallucinations:Hallucinations: None  Ideas of Reference:None  Suicidal Thoughts:Suicidal Thoughts: No  Homicidal Thoughts:Homicidal Thoughts: No   Sensorium  Memory: Immediate Fair  Judgment: Poor  Insight: Lacking   Executive Functions  Concentration: Fair  Attention Span: Fair  Recall: [employer] of Knowledge: Fair  Language: Fair   Psychomotor Activity  Psychomotor Activity: Psychomotor Activity: Tremor   Assets  Assets: Communication Skills; Desire for Improvement   Sleep  Sleep: Sleep: Poor   Nutritional Assessment (For OBS and FBC admissions only) Has the patient had a weight loss or gain of 10 pounds or more in the last 3 months?: No Has the patient had a decrease in food intake/or appetite?: No Does the patient have dental problems?: No Does the patient have eating habits or behaviors that may be indicators of an eating disorder including binging or inducing vomiting?: No Has the patient recently lost weight without trying?: 0 Has the patient been eating poorly because of a decreased appetite?: 0 Malnutrition Screening Tool Score: 0    Physical Exam Constitutional:      General: He is not in acute distress.    Appearance: He is not diaphoretic.  HENT:     Head: Normocephalic.     Right Ear: External ear normal.     Left Ear: External ear normal.     Nose: No congestion.  Eyes:     General:        Right eye: No discharge.        Left eye: No discharge.  Pulmonary:     Effort: No respiratory distress.  Chest:     Chest wall: No tenderness.  Neurological:     Mental Status: He is alert. Mental status is at baseline.  Psychiatric:        Attention and Perception: Attention and perception normal.        Mood and Affect: Mood is anxious and depressed.        Speech: Speech normal.        Behavior: Behavior is cooperative.        Thought Content: Thought content normal.    Review of Systems  Constitutional:  Negative for  chills, diaphoresis and fever.  HENT:  Negative for congestion.   Eyes:  Negative for discharge.  Respiratory:  Negative for cough, shortness of breath and wheezing.   Cardiovascular:  Negative for chest pain and palpitations.  Gastrointestinal:  Negative for diarrhea, nausea and vomiting.  Neurological:  Negative for dizziness, seizures, weakness and headaches.  Psychiatric/Behavioral:  Positive for depression and substance abuse. The patient is nervous/anxious.     Blood pressure (!) 147/92.   Past Psychiatric History: See H & P   Is the patient at risk to self? Yes  Has the patient been a risk to self in the past 6 months? Yes .    Has the patient been a risk to self within the distant past? Yes   Is the patient a risk to others? No   Has the patient been a risk to others in the past 6 months? No   Has the patient been a risk to others within the distant past? No   Past Medical History: See Chart Family History: N/A Social History: N/A  Last Labs:  Admission on 01/18/2024  Component Date Value Ref Range Status  POC Amphetamine UR 01/18/2024 None Detected  NONE DETECTED (Cut Off Level 1000 ng/mL) Final   POC Secobarbital (BAR) 01/18/2024 None Detected  NONE DETECTED (Cut Off Level 300 ng/mL) Final   POC Buprenorphine (BUP) 01/18/2024 None Detected  NONE DETECTED (Cut Off Level 10 ng/mL) Final   POC Oxazepam (BZO) 01/18/2024 Positive (A)  NONE DETECTED (Cut Off Level 300 ng/mL) Final   POC Cocaine UR 01/18/2024 None Detected  NONE DETECTED (Cut Off Level 300 ng/mL) Final   POC Methamphetamine UR 01/18/2024 None Detected  NONE DETECTED (Cut Off Level 1000 ng/mL) Final   POC Morphine 01/18/2024 None Detected  NONE DETECTED (Cut Off Level 300 ng/mL) Final   POC Methadone UR 01/18/2024 None Detected  NONE DETECTED (Cut Off Level 300 ng/mL) Final   POC Oxycodone UR 01/18/2024 None Detected  NONE DETECTED (Cut Off Level 100 ng/mL) Final   POC Marijuana UR 01/18/2024 Positive (A)   NONE DETECTED (Cut Off Level 50 ng/mL) Final  Admission on 01/16/2024, Discharged on 01/17/2024  Component Date Value Ref Range Status   Sodium 01/17/2024 138  135 - 145 mmol/L Final   Potassium 01/17/2024 3.0 (L)  3.5 - 5.1 mmol/L Final   Chloride 01/17/2024 101  98 - 111 mmol/L Final   CO2 01/17/2024 23  22 - 32 mmol/L Final   Glucose, Bld 01/17/2024 102 (H)  70 - 99 mg/dL Final   Glucose reference range applies only to samples taken after fasting for at least 8 hours.   BUN 01/17/2024 5 (L)  6 - 20 mg/dL Final   Creatinine, Ser 01/17/2024 0.57 (L)  0.61 - 1.24 mg/dL Final   Calcium 16/08/9603 8.4 (L)  8.9 - 10.3 mg/dL Final   Total Protein 54/07/8118 7.8  6.5 - 8.1 g/dL Final   Albumin 14/78/2956 3.8  3.5 - 5.0 g/dL Final   AST 21/30/8657 129 (H)  15 - 41 U/L Final   ALT 01/17/2024 27  0 - 44 U/L Final   Alkaline Phosphatase 01/17/2024 108  38 - 126 U/L Final   Total Bilirubin 01/17/2024 0.6  0.0 - 1.2 mg/dL Final   GFR, Estimated 01/17/2024 >60  >60 mL/min Final   Comment: (NOTE) Calculated using the CKD-EPI Creatinine Equation (2021)    Anion gap 01/17/2024 14  5 - 15 Final   Performed at [facility], [address], [place], [place] [address]   WBC 01/17/2024 3.6 (L)  4.0 - 10.5 K/uL Final   RBC 01/17/2024 3.54 (L)  4.22 - 5.81 MIL/uL Final   Hemoglobin 01/17/2024 10.9 (L)  13.0 - 17.0 g/dL Final   HCT 29/52/8413 33.8 (L)  39.0 - 52.0 % Final   MCV 01/17/2024 95.5  80.0 - 100.0 fL Final   MCH 01/17/2024 30.8  26.0 - 34.0 pg Final   MCHC 01/17/2024 32.2  30.0 - 36.0 g/dL Final   RDW 24/40/1027 14.8  11.5 - 15.5 % Final   Platelets 01/17/2024 218  150 - 400 K/uL Final   nRBC 01/17/2024 0.0  0.0 - 0.2 % Final   Neutrophils Relative % 01/17/2024 58  % Final   Neutro Abs 01/17/2024 2.1  1.7 - 7.7 K/uL Final   Lymphocytes Relative 01/17/2024 29  % Final   Lymphs Abs 01/17/2024 1.0  0.7 - 4.0 K/uL Final   Monocytes Relative 01/17/2024 10  % Final   Monocytes  Absolute 01/17/2024 0.4  0.1 - 1.0 K/uL Final   Eosinophils Relative 01/17/2024 2  % Final  Eosinophils Absolute 01/17/2024 0.1  0.0 - 0.5 K/uL Final   Basophils Relative 01/17/2024 1  % Final   Basophils Absolute 01/17/2024 0.1  0.0 - 0.1 K/uL Final   Immature Granulocytes 01/17/2024 0  % Final   Abs Immature Granulocytes 01/17/2024 0.01  0.00 - 0.07 K/uL Final   Performed at [facility], [address] [place], [place] [address]   Alcohol, Ethyl (B) 01/17/2024 499 (HH)  <10 mg/dL Final   Comment: CRITICAL RESULT CALLED TO, READ BACK BY AND VERIFIED WITH MAYWEATHER, S. RN AT 0132 ON 3.5.25. FA (NOTE) Lowest detectable limit for serum alcohol is 10 mg/dL.  For medical purposes only. Performed at [facility], [address] [place], [place] [address]     Allergies: Patient has no known allergies.  Medications:  Facility Ordered Medications  Medication   acetaminophen (TYLENOL) tablet 650 mg   alum & mag hydroxide-simeth (MAALOX/MYLANTA) 200-200-20 MG/5ML suspension 30 mL   magnesium hydroxide (MILK OF MAGNESIA) suspension 30 mL   [COMPLETED] thiamine (VITAMIN B1) injection 100 mg   [START ON 01/19/2024] thiamine (VITAMIN B1) tablet 100 mg   multivitamin with minerals tablet 1 tablet   LORazepam (ATIVAN) tablet 1 mg   hydrOXYzine (ATARAX) tablet 25 mg   loperamide (IMODIUM) capsule 2-4 mg   ondansetron (ZOFRAN-ODT) disintegrating tablet 4 mg   haloperidol (HALDOL) tablet 5 mg   And   diphenhydrAMINE (BENADRYL) capsule 50 mg   haloperidol lactate (HALDOL) injection 5 mg   And   diphenhydrAMINE (BENADRYL) injection 50 mg   And   LORazepam (ATIVAN) injection 2 mg   haloperidol lactate (HALDOL) injection 10 mg   And   diphenhydrAMINE (BENADRYL) injection 50 mg   And   LORazepam (ATIVAN) injection 2 mg   LORazepam (ATIVAN) tablet 1 mg   Followed by   Melene Muller ON 01/19/2024] LORazepam (ATIVAN) tablet 1 mg   Followed by   Melene Muller ON  01/20/2024] LORazepam (ATIVAN) tablet 1 mg   Followed by   Melene Muller ON 01/21/2024] LORazepam (ATIVAN) tablet 1 mg   gabapentin (NEURONTIN) capsule 300 mg   DULoxetine (CYMBALTA) DR capsule 20 mg   PTA Medications  Medication Sig   methocarbamol (ROBAXIN) 500 MG tablet Take 500 mg by mouth 3 (three) times daily as needed.   prazosin (MINIPRESS) 1 MG capsule Take 1 mg by mouth at bedtime.   gabapentin (NEURONTIN) 300 MG capsule Take 300 mg by mouth 3 (three) times daily as needed.   DULoxetine (CYMBALTA) 30 MG capsule Take 30 mg by mouth daily.   QUEtiapine (SEROQUEL) 100 MG tablet Take 1 tablet (100 mg total) by mouth at bedtime.   traZODone (DESYREL) 100 MG tablet Take 1 tablet (100 mg total) by mouth at bedtime.   naltrexone (DEPADE) 50 MG tablet Take 1 tablet (50 mg total) by mouth daily.   thiamine (VITAMIN B1) 100 MG tablet Take 100 mg by mouth daily.   hydrOXYzine (VISTARIL) 50 MG capsule Take 50 mg by mouth 3 (three) times daily.   folic acid (FOLVITE) 1 MG tablet Take 1 mg by mouth daily.   ondansetron (ZOFRAN) 4 MG tablet Take 1 tablet (4 mg total) by mouth every 6 (six) hours.   potassium chloride SA (KLOR-CON M) 20 MEQ tablet Take 1 tablet (20 mEq total) by mouth daily for 10 days.   chlordiazePOXIDE (LIBRIUM) 25 MG capsule 50mg  PO TID x 1D, then 25-50mg  PO BID X 1D, then 25-50mg  PO QD X 1D  Long Term Goals: Improvement in symptoms so as ready for discharge  Short Term Goals: Patient will verbalize feelings in meetings with treatment team members., Patient will attend at least of 50% of the groups daily., Pt will complete the PHQ9 on admission, day 3 and discharge., Patient will participate in completing the [place] Suicide Severity Rating Scale, Patient will score a low risk of violence for 24 hours prior to discharge, and Patient will take medications as prescribed daily.  Medical Decision Making  Recommend admission to the [facility] for substance abuse treatment. Recommend restarting  Cymbalta 20 mg PO daily for depressive symptoms   Recommend CIWA protocol Initiate CIWA protocol -lorazepam 1 mg every 6 hours prn for CIWA >10 -thiamine 100 mg daily for nutritional supplementation -hydroxyzine 25 mg every 6 hours prn for anxiety, CIWA < or = 10 -ondansetron 4 mg ODT every 6 hours prn nausea/vomiting -loperamide 2-4 mg capsule prn diarrhea or loose stools   I have reviewed the pertinent labs and medications administered at the ED: BAL 499, Kdur given for hypokalemia, HGB abnormal, last check 4 years ago, no acute concern for active GI hemorrhage or bleed  Current lab orders Lab Orders         POCT Urine Drug Screen - (I-Screen)      Prn meds Tylenol, MOM, Maalox  Prn -agitation protocol meds  Recommendations  Based on my evaluation the patient does not appear to have an emergency medical condition.  Recommend admission to the [facility] for substance abuse treatment/detox.  Mancel Bale, NP 01/18/24  3:05 AM

## 2024-01-18 NOTE — ED Notes (Signed)
 Patient continues to experience etoh withdrawal symptoms at this time.  He have tremors of the hands however denies everything else. No evidence of DT's or seizure activity.  Patient educated and encouraged to seek out staff if he has increased signs of withdrawal or an aura.   He was up and out of bed and ate dinner earlier.  Patient b/p = 135/93 106.  MD made aware and patient recently received another dose of 2mg  ativan PO standing.  MD made aware of V.S.  no new orders given.

## 2024-01-18 NOTE — Discharge Instructions (Signed)
 [facility] [address], [place], [address] [phone] phone  New Patient Assessment/Therapy Walk-Ins:  Monday and Wednesday: 8 am until slots are full. Every 1st and 2nd Fridays of the month: 1 pm - 5 pm.  NO ASSESSMENT/THERAPY WALK-INS ON TUESDAYS OR THURSDAYS  New Patient Assessment/Medication Management Walk-Ins:  Monday - Friday:  8 am - 11 am.  For all walk-ins, we ask that you arrive by 7:30 am because patients will be seen in the order of arrival.  Availability is limited; therefore, you may not be seen on the same day that you walk-in.  Our goal is to serve and meet the needs of our community to the best of our [place] ability.  SUBSTANCE USE TREATMENT for Medicaid and State Funded/IPRS  [facility] [address], [place], [address] [phone] phone NOTE: [facility] is no longer offering IOP services.  Serves those who are low-income or have no insurance.  [facility] [address] [place], [place], [address] [phone] phone [phone] fax NOTE: Does have Substance Abuse-Intensive Outpatient Program [facility]) as well as transitional housing if eligible.  [facility] [address] [place], [place], [address] [phone] phone [phone] fax  [facility] [phone] [place], [place], [address] [phone] phone [phone] fax  HALFWAY HOUSES:  [facility] [phone]  [employer].oxfordvacancies.com  12 STEP PROGRAMS:  Alcoholics Anonymous of [place] Software[employer]et.be  Narcotics Anonymous of [place] [employer]ct.dk  Al-Anon of [place], [place] www.[place]alanon.org/find-meetings.html  Nar-Anon https://nar-anon.org/find-a-meetin  List of Residential placements:   [facility] in [place]: [phone]  [facility]: [phone]  [place], [place]  [phone]: Male and male facility; 30-day program: (uninsured and Medicaid such as Laurena Bering, [place], [place], partners)  [facility]: [phone]; men and women's facility; 28 days; Can have Medicaid tailored plan Tour manager or Partners)  [facility]: [phone] Karoline Caldwell or Larita Fife; 28 day program; must be fully detox; tailored Medicaid or no insurance  [address] in [place], [place]; [phone]; 28 day all males program; no insurance accepted  [facility] Referral in [place]: Gabriel Rung [phone] (no insurance or Medicaid only); 90 days; outpatient services but provide housing in apartments [place]  [facility] Admission: [phone]: Patient must complete phone screening for placement: [place], [place]; 6 month program; uninsured, Medicaid, and [employer].   [facility]: no insurance required; [phone]  [facility]: [phone]; Intake: Molly Maduro; Must fill out application online; Alecia Lemming Delay [phone] x [address] [facility] in [place], [place]: [phone]; Admissions Coordinators Mr. Maurine Minister or Barron Alvine; 90 day program.  [facility]: [place], [place] [phone]; Co-Ed 9 month to a year program; Online application; Men entry fee is $500 (6-53months);  [employer]: [address] [place], [place] [address]; no fee or insurance required; minimum of 2 years; Highly structured; work based; Intake Coordinator is Thayer Ohm [phone]  [facility] in [place], [place]: [phone]; Fax number is [phone]; website: www.[facility].org; Requires 3-6 page autobiography; 2 year program (18 months and then 25month transitional housing); Admission fee is $300; no insurance needed; work Automotive engineer in [place], [place]: [employer] Desk Staff: Danise Edge [phone]: They have a Men's Regenerations Program 6-39months. Free program; There is an initial $300 fee however, they are willing to work  with patients regarding that. Application is online.  [facility]: Admissions [phone] Doran Heater ext [phone]; Any 7-90 day program is out of pocket; 12  month program is free of charge; there is a $275 entry fee; Patient is responsible for own transportation

## 2024-01-18 NOTE — BH Assessment (Signed)
 Comprehensive Clinical Assessment (CCA) Note  01/18/2024 Xavier Owens [id number]  Disposition: Rockney Ghee, NP recommends pt admitted to Facility Based Crisis.   The patient demonstrates the following risk factors for suicide: Chronic risk factors for suicide include: substance use disorder. Acute risk factors for suicide include: N/A. Protective factors for this patient include:  Pt denies, SI . Considering these factors, the overall suicide risk at this point appears to be no risk. Patient is not appropriate for outpatient follow up.  Xavier Owens is a 53 year old male who presents voluntary and unaccompanied to [facility] Clinician asked the pt, "what brought you to the hospital?" Pt reports, wanting treatment for alcohol use. Pt denies, SI, HI,  hallucinations, self-injurious behaviors and access to weapons.   Pt reports, he drank a fifth of Vodka on Tuesday (01/16/2024). Per chart, pt was seen at [facility] then [facility] on Wednesday for alcohol detox. Pt's BAL was 499 on 01/17/2024. Pt's UDS is positive for Oxazepam and Marijuana, Pt reports, he has a previous inpatient substance admission at [facility] in [place], [place]; where he maintained 30 days of sobriety.   Pt presents quiet, awake some tremors in casual attire with normal speech and eye contact. Pt's mood was anxious, sad. Pt's affect was congruent. Pt's insight and judgement was poor.  Chief Complaint: No chief complaint on file.  Visit Diagnosis: Alcohol use Disorder, severe, dependence (HCC).   CCA Screening, Triage and Referral (STR)  Patient Reported Information How did you hear about [place]? Self  What Is the Reason for Your Visit/Call Today? Pt presents to [facility] as a voluntary walk-in, requesting substance abuse treatment/detox. Pt was seen at [facility] earlier today for same presentation, however he was sent to ED due to feeling like he was about to have seizure-like activity and  nausea. Pt has returned and reports that he last drank a fifth of vodka yesterday. Pt reports drinking daily for about 30 years. Pt currently denies SI,HI,AVH.  How Long Has This Been Causing You Problems? > than 6 months  What Do You Feel Would Help You the Most Today? Alcohol or Drug Use Treatment   Have You Recently Had Any Thoughts About Hurting Yourself? No  Are You Planning to Commit Suicide/Harm Yourself At This time? No   Flowsheet Row ED from 01/18/2024 in [facility] Most recent reading at 01/18/2024  2:01 AM ED from 01/17/2024 in [facility] Most recent reading at 01/17/2024 10:42 PM ED from 01/17/2024 in [facility] Department at [facility] Most recent reading at 01/17/2024  7:05 PM  C-SSRS RISK CATEGORY No Risk Error: Question 2 not populated No Risk       Have you Recently Had Thoughts About Hurting Someone [name]? No  Are You Planning to Harm Someone at This Time? No  Explanation: None.   Have You Used Any Alcohol or Drugs in the Past 24 Hours? Yes  How Long Ago Did You Use Drugs or Alcohol? On Tuesday (01/16/2024)  What Did You Use and How Much? Pt reports, he drank a fifth of Vodka.   Do You Currently Have a Therapist/Psychiatrist? No  Name of Therapist/Psychiatrist:    Have You Been Recently Discharged From Any Office Practice or Programs? No  Explanation of Discharge From Practice/Program: None.    CCA Screening Triage Referral Assessment Type of Contact: Face-to-Face  Telemedicine Service Delivery:   Is this Initial or Reassessment?   Date  Telepsych consult ordered in CHL:    Time Telepsych consult ordered in CHL:    Location of Assessment: [facility] Services  Provider Location: [facility] Assessment Services   Collateral Involvement: None.   Does Patient Have a Automotive engineer Guardian? No  Legal Guardian Contact Information: Pt is his own guardian.  Copy of  Legal Guardianship Form: -- (Pt is his own guardian.)  Legal Guardian Notified of Arrival: -- (Pt is his own guardian.)  Legal Guardian Notified of Pending Discharge: -- (Pt is his own guardian.)  If Minor and Not Living with Parent(s), Who has Custody? Pt is an adult.  Is CPS involved or ever been involved? Never  Is APS involved or ever been involved? Never   Patient Determined To Be At Risk for Harm To Self or Others Based on Review of Patient Reported Information or Presenting Complaint? Yes, for Self-Harm  Method: No Plan  Availability of Means: No access or NA  Intent: Vague intent or NA  Notification Required: No need or identified person  Additional Information for Danger to Others Potential: -- (None.)  Additional Comments for Danger to Others Potential: None.  Are There Guns or Other Weapons in Your Home? No  Types of Guns/Weapons: Pt denies.  Are These Weapons Safely Secured?                            -- (NA)  Who Could Verify You Are Able To Have These Secured: NA  Do You Have any Outstanding Charges, Pending Court Dates, Parole/Probation? Pt denies.  Contacted To Inform of Risk of Harm To Self or Others: Other: Comment (None.)    Does Patient Present under Involuntary Commitment? No    [place] of Residence: [place]   Patient Currently Receiving the Following Services: Not Receiving Services   Determination of Need: Emergent (2 hours)   Options For Referral: Facility-Based Crisis     CCA Biopsychosocial Patient Reported Schizophrenia/Schizoaffective Diagnosis in Past: No   Strengths: Pt want help for his addiciton.   Mental Health Symptoms Depression:  Fatigue; Increase/decrease in appetite; Sleep (too much or little); Tearfulness   Duration of Depressive symptoms: Duration of Depressive Symptoms: Greater than two weeks   Mania:  None   Anxiety:   Worrying; Restlessness   Psychosis:  None   Duration of Psychotic symptoms:     Trauma:  None   Obsessions:  None   Compulsions:  None   Inattention:  None   Hyperactivity/Impulsivity:  Feeling of restlessness   Oppositional/Defiant Behaviors:  None   Emotional Irregularity:  None   Other Mood/Personality Symptoms:  None.    Mental Status Exam Appearance and self-care  Stature:  Average   Weight:  Average weight   Clothing:  Casual   Grooming:  Normal   Cosmetic use:  None   Posture/gait:  Other (Comment) (Pt sitting in chair.)   Motor activity:  Tremor   Sensorium  Attention:  Normal   Concentration:  Normal   Orientation:  X5   Recall/memory:  Normal   Affect and Mood  Affect:  Congruent   Mood:  Other (Comment); Anxious (Sad.)   Relating  Eye contact:  Normal   Facial expression:  Responsive   Attitude toward examiner:  Cooperative   Thought and Language  Speech flow: Normal   Thought content:  Appropriate to Mood and Circumstances   Preoccupation:  None   Hallucinations:  None  Organization:  Patent examiner of Knowledge:  Fair   Intelligence:  Average   Abstraction:  Functional   Judgement:  Poor   Reality Testing:  Adequate   Insight:  Poor   Decision Making:  Impulsive   Social Functioning  Social Maturity:  Impulsive   Social Judgement:  "Street Smart"   Stress  Stressors:  Other (Comment) (Drinking.)   Coping Ability:  Overwhelmed   Skill Deficits:  Decision making; Self-control   Supports:  Family     Religion: Religion/Spirituality Are You A Religious Person?: No How Might This Affect Treatment?: None.  Leisure/Recreation: Leisure / Recreation Do You Have Hobbies?: Yes Leisure and Hobbies: Pt reports, "none just drink."  Exercise/Diet: Exercise/Diet Do You Exercise?: Yes What Type of Exercise Do You Do?: Run/Walk How Many Times a Week Do You Exercise?: Daily Have You Gained or Lost A Significant Amount of Weight in the Past Six Months?: No Do You  Follow a Special Diet?: No Do You Have Any Trouble Sleeping?: Yes Explanation of Sleeping Difficulties: Pt reports, gettimg four hours of sleep.   CCA Employment/Education Employment/Work Situation: Employment / Work Situation Employment Situation: Unemployed Patient's Job has Been Impacted by Current Illness: No Has Patient ever Been in [employer]?: Yes (Describe in comment) (Pt was in the [employer]	[place] from [employer] Did You Receive Any Psychiatric Treatment/Services While in the [employer]?: No  Education: Education Is Patient Currently Attending School?: No Last Grade Completed: 12 Did You Product manager?: No Did You Have An Individualized Education Program (IIEP): No Did You Have Any Difficulty At School?: No Patient's Education Has Been Impacted by Current Illness: No   CCA Family/Childhood History Family and Relationship History: Family history Marital status: Single Does patient have children?: Yes How many children?: 2 How is patient's relationship with their children?: Pt reports, the relationship with his children are not good.  Childhood History:  Childhood History By whom was/is the patient raised?: Mother Did patient suffer any verbal/emotional/physical/sexual abuse as a child?: No Did patient suffer from severe childhood neglect?: No Has patient ever been sexually abused/assaulted/raped as an adolescent or adult?: No Was the patient ever a victim of a crime or a disaster?: No Witnessed domestic violence?: No Has patient been affected by domestic violence as an adult?: No   CCA Substance Use Alcohol/Drug Use: Alcohol / Drug Use Pain Medications: See MAR Prescriptions: See MAR Over the Counter: See MAR History of alcohol / drug use?: Yes Longest period of sobriety (when/how long): Pt reports, 30 days. Negative Consequences of Use: Legal Withdrawal Symptoms: Tremors Substance #1 Name of Substance 1: Alcohol. 1 - Age of First Use: 15. 1 - Amount  (size/oz): Pt reports, drinking a fifth of Vodka Tueday (01/16/2024) 1 - Frequency: Pt reports, he drinks daily. 1 - Duration: Ongoing. 1 - Last Use / Amount: Tuesday (01/16/2024). 1 - Method of Aquiring: Purchase 1- Route of Use: Oral.    ASAM's:  Six Dimensions of Multidimensional Assessment  Dimension 1:  Acute Intoxication and/or Withdrawal Potential:   Dimension 1:  Description of individual's past and current experiences of substance use and withdrawal: Pt reports, he's currently having tremors.  Dimension 2:  Biomedical Conditions and Complications:   Dimension 2:  Description of patient's biomedical conditions and  complications: Per chart, pt has a previous diagnosis of: Subclinical hypothyroidism.  Dimension 3:  Emotional, Behavioral, or Cognitive Conditions and Complications:  Dimension 3:  Description of emotional, behavioral, or cognitive conditions  and complications: Per chart, pt has a previous diagnosis of: Alcohol use disorder, severe, dependence (HCC) and Substance induced mood disorder (HCC).  Dimension 4:  Readiness to Change:  Dimension 4:  Description of Readiness to Change criteria: Pt is seeking help to become sober.  Dimension 5:  Relapse, Continued use, or Continued Problem Potential:  Dimension 5:  Relapse, continued use, or continued problem potential critiera description: Pt reports, he has a previous inpatient substance admisson at [facility] in [place], [place].  Dimension 6:  Recovery/Living Environment:  Dimension 6:  Recovery/Iiving environment criteria description: Pt reports, he lives alone in [place].  ASAM Severity Score: ASAM's Severity Rating Score: 7  ASAM Recommended Level of Treatment: ASAM Recommended Level of Treatment: Level I Outpatient Treatment   Substance use Disorder (SUD) Substance Use Disorder (SUD)  Checklist Symptoms of Substance Use: Continued use despite persistent or recurrent social, interpersonal problems, caused or exacerbated by  use, Evidence of withdrawal (Comment), Presence of craving or strong urge to use, Persistent desire or unsuccessful efforts to cut down or control use (Clinician observed tremors.)  Recommendations for Services/Supports/Treatments: Recommendations for Services/Supports/Treatments Recommendations For Services/Supports/Treatments: Inpatient Hospitalization, Individual Therapy, Medication Management, Facility Based Crisis  Disposition Recommendation per psychiatric provider: Pt admitted to Facility Based Crisis.    DSM5 Diagnoses: Patient Active Problem List   Diagnosis Date Noted   Alcohol use disorder, severe, dependence (HCC) 01/18/2024   Healthcare maintenance 06/10/2022   Nightmares 12/21/2021   Subclinical hypothyroidism 12/21/2021   Substance induced mood disorder (HCC) 12/14/2021   Alcohol use disorder 06/23/2021   Seizures (HCC) 06/22/2021   Elevated liver enzymes 04/19/2016   Essential hypertension 04/19/2016     Referrals to Alternative Service(s): Referred to Alternative Service(s):   Place:   Date:   Time:    Referred to Alternative Service(s):   Place:   Date:   Time:    Referred to Alternative Service(s):   Place:   Date:   Time:    Referred to Alternative Service(s):   Place:   Date:   Time:     Redmond Pulling, LCMHCComprehensive Clinical Assessment (CCA) Screening, Triage and Referral Note  01/18/2024 Xavier Owens [id number]  Chief Complaint: No chief complaint on file.  Visit Diagnosis:   Patient Reported Information How did you hear about [place]? Self  What Is the Reason for Your Visit/Call Today? Pt presents to [facility] as a voluntary walk-in, requesting substance abuse treatment/detox. Pt was seen at [facility] earlier today for same presentation, however he was sent to ED due to feeling like he was about to have seizure-like activity and nausea. Pt has returned and reports that he last drank a fifth of vodka yesterday. Pt reports drinking daily for about 30 years.  Pt currently denies SI,HI,AVH.  How Long Has This Been Causing You Problems? > than 6 months  What Do You Feel Would Help You the Most Today? Alcohol or Drug Use Treatment   Have You Recently Had Any Thoughts About Hurting Yourself? No  Are You Planning to Commit Suicide/Harm Yourself At This time? No   Have you Recently Had Thoughts About Hurting Someone [name]? No  Are You Planning to Harm Someone at This Time? No  Explanation: None.   Have You Used Any Alcohol or Drugs in the Past 24 Hours? Yes  How Long Ago Did You Use Drugs or Alcohol? On Tuesday (01/16/2024)  What Did You Use and How Much? Pt reports, he drank a fifth of Vodka.  Do You Currently Have a Therapist/Psychiatrist? No  Name of Therapist/Psychiatrist: Pt denies, linked to OPT resources (medication management and/or counseling.)    Have You Been Recently Discharged From Any Office Practice or Programs? No  Explanation of Discharge From Practice/Program: None.    CCA Screening Triage Referral Assessment Type of Contact: Face-to-Face  Telemedicine Service Delivery:   Is this Initial or Reassessment?   Date Telepsych consult ordered in CHL:    Time Telepsych consult ordered in CHL:    Location of Assessment: [facility]  Provider Location: [facility] Assessment Services    Collateral Involvement: None.   Does Patient Have a Automotive engineer Guardian? None.  Name and Contact of Legal Guardian: Pt is his own guardian.  If Minor and Not Living with Parent(s), Who has Custody? Pt is an adult.  Is CPS involved or ever been involved? Never  Is APS involved or ever been involved? Never   Patient Determined To Be At Risk for Harm To Self or Others Based on Review of Patient Reported Information or Presenting Complaint? Yes, for Self-Harm  Method: No Plan  Availability of Means: No access or NA  Intent: Vague intent or NA  Notification Required: No need or identified  person  Additional Information for Danger to Others Potential: -- (None.)  Additional Comments for Danger to Others Potential: None.  Are There Guns or Other Weapons in Your Home? No  Types of Guns/Weapons: Pt denies.  Are These Weapons Safely Secured?                            -- (NA)  Who Could Verify You Are Able To Have These Secured: NA  Do You Have any Outstanding Charges, Pending Court Dates, Parole/Probation? Pt denies.  Contacted To Inform of Risk of Harm To Self or Others: Other: Comment (None.)   Does Patient Present under Involuntary Commitment? No    [place] of Residence: [place]   Patient Currently Receiving the Following Services: Not Receiving Services   Determination of Need: Emergent (2 hours)   Options For Referral: Facility-Based Crisis   Disposition Recommendation per psychiatric provider: Pt admitted to Facility Based Crisis.   Redmond Pulling, [facility]   Redmond Pulling, MS, [facility], [facility] Triage Specialist [phone]

## 2024-01-18 NOTE — Tx Team (Signed)
 LCSW and Resident met with patient to assess current mood, affect, physical state, and inquire about needs/goals while here in [facility] and after discharge. Patient reports he presented due to needing to quit drinking. Patient reports he has been drinking for the past 30 years. Patient reports he drinks a 5th of liquor a day. Patient reports a period of sobriety about 2-3 years ago and reports being in a 30 day program at that time. Patient reports he was at [facility] in [place] and completed the program successfully. Patient denies any other substance use. Patient denies having legal charges or upcoming court dates.  Patient reports having support from his wife: Shilo Pauwels [phone].  Patient reports they have a good relationship and also have 2 adult children ages 67 and 13.  Patient reports he currently works at [employer] and has been there for 3 years.  Patient reports being on a Sunday through Thursday schedule.  Patient reports his current goal is to detox and then be linked to outpatient resources in [place].  Patient reports he is not able to commit to an intensive outpatient program at this time due to his work schedule.  Patient made aware that LCSW will explore outpatient options and will follow up with updates as received.  No other needs were reported by the patient at this time.  Patient did provide permission for team to follow up with his wife to safety plan.  LCSW will continue to follow and provide support to patient while on FBC unit.  Fernande Boyden, LCSW Clinical Social Worker [facility] Ph: [phone]

## 2024-01-18 NOTE — ED Notes (Signed)
 Patient is sleeping. Respirations equal and unlabored, skin warm and dry. No change in assessment or acuity. Routine safety checks conducted according to facility protocol. Will continue to monitor for safety.

## 2024-01-18 NOTE — Group Note (Signed)
 Group Topic: Wellness  Group Date: 01/18/2024 Start Time: 1145 End Time: 1230 Facilitators: Londell Moh, NT  Department: [facility]  Number of Participants: 10  Group Focus: Nutrition Treatment Modality:  Psychoeducation Interventions utilized were patient education Purpose: increase insight  Name: Xavier Owens Date of Birth: [birth year]  MR: [id number]    Level of Participation: Pt did not attend group Patients Problems:  Patient Active Problem List   Diagnosis Date Noted   Alcohol use disorder, severe, dependence (HCC) 01/18/2024   Healthcare maintenance 06/10/2022   Nightmares 12/21/2021   Subclinical hypothyroidism 12/21/2021   Substance induced mood disorder (HCC) 12/14/2021   Alcohol use disorder 06/23/2021   Seizures (HCC) 06/22/2021   Elevated liver enzymes 04/19/2016   Essential hypertension 04/19/2016

## 2024-01-18 NOTE — ED Notes (Signed)
 Writer went to pt to get blood work order done, pt says he would like to sleep and do it later in the day.

## 2024-01-18 NOTE — Group Note (Signed)
 Group Topic: Wellness  Group Date: 01/18/2024 Start Time: 1000 End Time: 1030 Facilitators: Londell Moh, NT  Department: [facility]  Number of Participants: 10  Group Focus: check in, concentration, and daily focus Treatment Modality:  Psychoeducation Interventions utilized were patient education Purpose: enhance coping skills, express feelings, and increase insight  Name: Xavier Owens Date of Birth: 04-23-1971  MR: [id number]    Level of Participation: moderate Quality of Participation: attentive Interactions with others: gave feedback Mood/Affect: appropriate Triggers (if applicable): n/a Cognition: coherent/clear Progress: Moderate Response: Pt was able to share goals for the day and express understanding towards rules and expectations of the fbc unit. Plan: patient will be encouraged to attend future groups.  Patients Problems:  Patient Active Problem List   Diagnosis Date Noted   Alcohol use disorder, severe, dependence (HCC) 01/18/2024   Healthcare maintenance 06/10/2022   Nightmares 12/21/2021   Subclinical hypothyroidism 12/21/2021   Substance induced mood disorder (HCC) 12/14/2021   Alcohol use disorder 06/23/2021   Seizures (HCC) 06/22/2021   Elevated liver enzymes 04/19/2016   Essential hypertension 04/19/2016

## 2024-01-18 NOTE — ED Notes (Signed)
 Patients ciwa = 10 and patient continues to have visible tremors.  Dr Sindy Messing made aware and patient given an additional 1mg  ativan PO PRN.

## 2024-01-18 NOTE — Group Note (Signed)
 Group Topic: Feelings about Diagnosis  Group Date: 01/18/2024 Start Time: 1120 End Time: 1145 Facilitators: Jenean Lindau, RN  Department: [facility]  Number of Participants: 10  Group Focus: abuse issues, acceptance, affirmation, anger management, anxiety, chemical dependency education, and coping skills Treatment Modality:  Behavior Modification Therapy Interventions utilized were clarification, confrontation, exploration, and group exercise Purpose: enhance coping skills, explore maladaptive thinking, express feelings, express irrational fears, improve communication skills, increase insight, regain self-worth, reinforce self-care, and relapse prevention strategies  Name: Xavier Owens Date of Birth: 06/06/1971  MR: [id number]    Level of Participation: did not attend Quality of Participation:  Interactions with others:  Mood/Affect:  Triggers (if applicable):  Cognition:  Progress:  Response:  Plan:   Patients Problems:  Patient Active Problem List   Diagnosis Date Noted   Alcohol use disorder, severe, dependence (HCC) 01/18/2024   Healthcare maintenance 06/10/2022   Nightmares 12/21/2021   Subclinical hypothyroidism 12/21/2021   Substance induced mood disorder (HCC) 12/14/2021   Alcohol use disorder 06/23/2021   Seizures (HCC) 06/22/2021   Elevated liver enzymes 04/19/2016   Essential hypertension 04/19/2016

## 2024-01-18 NOTE — ED Notes (Signed)
 Patient is resting in bed with eyes closed and is easily aroused. He denies SIHI, AVH, and anxiety and depression. Staff will continue to monitor for safety and for changes in condition.

## 2024-01-19 MED ORDER — LORAZEPAM 1 MG PO TABS
2.0000 mg | ORAL_TABLET | Freq: Four times a day (QID) | ORAL | Status: AC
  Administered 2024-01-19 – 2024-01-20 (×4): 2 mg via ORAL
  Filled 2024-01-19 (×4): qty 2

## 2024-01-19 MED ORDER — LORAZEPAM 1 MG PO TABS
2.0000 mg | ORAL_TABLET | Freq: Three times a day (TID) | ORAL | Status: DC
  Administered 2024-01-20 (×2): 2 mg via ORAL
  Filled 2024-01-19 (×3): qty 2

## 2024-01-19 MED ORDER — GABAPENTIN 400 MG PO CAPS
800.0000 mg | ORAL_CAPSULE | Freq: Three times a day (TID) | ORAL | Status: DC
Start: 2024-01-19 — End: 2024-01-22
  Administered 2024-01-19 – 2024-01-22 (×9): 800 mg via ORAL
  Filled 2024-01-19 (×9): qty 2

## 2024-01-19 MED ORDER — LORAZEPAM 1 MG PO TABS: 2.0000 mg | ORAL_TABLET | Freq: Two times a day (BID) | ORAL | Status: DC

## 2024-01-19 NOTE — Discharge Planning (Signed)
 LCSW contacted [facility] outpatient to arrange hospital discharge appt for patient. Per Admissions, earliest appointment available for therapy is March 06, 2024 at 1:00pm with Madelaine Bhat and February 27, 2024 at 8:00am with Dr. Alfonse Flavors. Appts have been secured. Patient will be made aware and open access hours will be provided to the patient for earlier services as needed. No other needs to report at this time. LCSW will continue to follow and provide support to patient while on FBC unit.   Fernande Boyden, LCSW Clinical Social Worker [facility] Ph: [phone]

## 2024-01-19 NOTE — ED Notes (Signed)
 Patient is resting in bed with eyes closed without any distress noted. Staff will continue to monitor safety and for changes in condition.

## 2024-01-19 NOTE — Progress Notes (Signed)
 Pt was visible in the milieu and attended groups. No distress noted or concerns voiced. Staff will monitor for pt's safety.

## 2024-01-19 NOTE — [facility] Group Notes (Signed)
 SPIRITUALITY GROUP NOTE  Spirituality group facilitated by Wilkie Aye, MDiv, BCC.  Group Description: Group focused on topic of hope. Patients participated in facilitated discussion around topic, connecting with one another around experiences and definitions for hope. Group members engaged with visual explorer photos, reflecting on what hope looks like for them today. Group engaged in discussion around how their definitions of hope are present today in hospital.  Modalities: Psycho-social ed, Adlerian, Narrative, MI  Patient Progress: Maximillian was present throughout group.  Participated in group discussion, describing hope as continuing to look.

## 2024-01-19 NOTE — Progress Notes (Addendum)
 Pt is wake, alert and oriented X4. Pt complained of foot pain. No signs of acute distress noted. PRN Acetaminophen and  scheduled meds were administered per order. Pt has gross tremors. Pt denies current SI/HI/AVH, plan or intent. Staff will monitor for pt's safety.

## 2024-01-19 NOTE — Group Note (Signed)
 Group Topic: Positive Affirmations  Group Date: 01/19/2024 Start Time: 0800 End Time: 2100 Facilitators: Quinn Axe, NT  Department: [facility]  Number of Participants: 8  Group Focus: affirmation Treatment Modality:  Cognitive Behavioral Therapy and Spiritual Interventions utilized were clarification and story telling Purpose: enhance coping skills, express feelings, increase insight, regain self-worth, and reinforce self-care  Name: Xavier Owens Date of Birth: December 21, 1970  MR: [id number]    Level of Participation: not present Quality of Participation: n/a Interactions with others: n/a Mood/Affect: n/a Triggers (if applicable): n/a Cognition: n/a Progress: None Response: n/a Plan: patient will be encouraged to attend all scheduled activities on the unit.  Patients Problems:  Patient Active Problem List   Diagnosis Date Noted   Alcohol use disorder, severe, dependence (HCC) 01/18/2024   Healthcare maintenance 06/10/2022   Nightmares 12/21/2021   Subclinical hypothyroidism 12/21/2021   Substance induced mood disorder (HCC) 12/14/2021   Alcohol use disorder 06/23/2021   Seizures (HCC) 06/22/2021   Elevated liver enzymes 04/19/2016   Essential hypertension 04/19/2016

## 2024-01-19 NOTE — Group Note (Signed)
 Group Topic: Core Beliefs  Group Date: 01/19/2024 Start Time: 0145 End Time: 0245 Facilitators: Loleta Dicker, LCSW  Department: [facility]  Number of Participants: 5  Group Focus: check in and clarity of thought Treatment Modality:  Cognitive Behavioral Therapy Interventions utilized were exploration, story telling, and support Purpose: improve communication skills  Name: Xavier Owens Date of Birth: Dec 15, 1970  MR: [id number]    Level of Participation: minimal Quality of Participation: cooperative Interactions with others: minimal Mood/Affect: appropriate Triggers (if applicable): N/A Cognition: coherent/clear Progress: Moderate Response: Patient participated in group on today. Patient expressed understanding of group rules and confidentiality. Patient was able to mention his first thought regarding certain words and stigmas attached to mental health and substance use. Patient reports he has a goal to focus quit drinking and reports he plans to go back to working out and fishing when the weather permits to help with his drinking. No issues to report.  Plan: referral / recommendations  Patients Problems:  Patient Active Problem List   Diagnosis Date Noted   Alcohol use disorder, severe, dependence (HCC) 01/18/2024   Healthcare maintenance 06/10/2022   Nightmares 12/21/2021   Subclinical hypothyroidism 12/21/2021   Substance induced mood disorder (HCC) 12/14/2021   Alcohol use disorder 06/23/2021   Seizures (HCC) 06/22/2021   Elevated liver enzymes 04/19/2016   Essential hypertension 04/19/2016

## 2024-01-19 NOTE — ED Provider Notes (Signed)
 Behavioral Health Progress Note  Date and Time: 01/19/2024 8:18 AM Name: Xavier Owens MRN:  [id number]  Reason for admission: Xavier Owens is a 53 year old male with psychiatric history of alcohol abuse, substance-induced mood disorder, and medical history of seizures, elevated liver enzymes and essential hypertension, who presented voluntarily to [facility] requesting alcohol abuse treatment/detox.  UDS is positive for benzodiazepines and cannabis.  Subjective:   Patient was evaluated in his room, reports he is still experiencing withdrawal symptoms today, notably tremors that are unchanged. He also reports some neuropathy, is amenable to increase in gabapentin to address withdrawal associated distress.He reports 3 bouts of diarrhea yesterday, no diarrhea today.   On interview, suicidal ideations are not present. Thoughts of self harm are not present. Homicidal ideations are not present.   There are no auditory hallucinations, visual hallucinations, paranoid ideations, or delusional thought processes.   Side effects to currently prescribed medications are none.  Diagnosis:  Final diagnoses:  Alcohol use disorder, severe, dependence (HCC)    Total Time spent with patient: 1.5 hours  Substance Use History: He reports history of alcohol abuse for the past 30 years and drinks 1/5th of Vodka daily, last drink yesterday.  He reports a history of withdrawal seizures and DT's.  --was in a residential rehab program named [facility] in [place].   Past Psychiatric History:  He is currently not established with an outpatient psychiatrist or therapist.  No formal past psychiatric history Past Medical History: No significant past medical history.  No Family History: Patient Family Psychiatric  History: Unknown to patient Social History: See above   Pain Medications: See MAR Prescriptions: See MAR Over the Counter: See MAR History of alcohol / drug use?: Yes Longest  period of sobriety (when/how long): Pt reports, 30 days. Negative Consequences of Use: Legal Withdrawal Symptoms: Tremors Name of Substance 1: Alcohol. 1 - Age of First Use: 15. 1 - Amount (size/oz): Pt reports, drinking a fifth of Vodka Tueday (01/16/2024) 1 - Frequency: Pt reports, he drinks daily. 1 - Duration: Ongoing. 1 - Last Use / Amount: Tuesday (01/16/2024). 1 - Method of Aquiring: Purchase 1- Route of Use: Oral.     Current Medications:  Current Facility-Administered Medications  Medication Dose Route Frequency Provider Last Rate Last Admin   acetaminophen (TYLENOL) tablet 650 mg  650 mg Oral Q6H PRN Onuoha, Chinwendu V, NP       alum & mag hydroxide-simeth (MAALOX/MYLANTA) 200-200-20 MG/5ML suspension 30 mL  30 mL Oral Q4H PRN Onuoha, Chinwendu V, NP       haloperidol (HALDOL) tablet 5 mg  5 mg Oral TID PRN Onuoha, Chinwendu V, NP       And   diphenhydrAMINE (BENADRYL) capsule 50 mg  50 mg Oral TID PRN Onuoha, Chinwendu V, NP       haloperidol lactate (HALDOL) injection 5 mg  5 mg Intramuscular TID PRN Onuoha, Chinwendu V, NP       And   diphenhydrAMINE (BENADRYL) injection 50 mg  50 mg Intramuscular TID PRN Onuoha, Chinwendu V, NP       And   LORazepam (ATIVAN) injection 2 mg  2 mg Intramuscular TID PRN Onuoha, Chinwendu V, NP       haloperidol lactate (HALDOL) injection 10 mg  10 mg Intramuscular TID PRN Onuoha, Chinwendu V, NP       And   diphenhydrAMINE (BENADRYL) injection 50 mg  50 mg Intramuscular TID PRN Onuoha, Chinwendu V, NP  And   LORazepam (ATIVAN) injection 2 mg  2 mg Intramuscular TID PRN Onuoha, Chinwendu V, NP       DULoxetine (CYMBALTA) DR capsule 20 mg  20 mg Oral Daily Onuoha, Chinwendu V, NP   20 mg at 01/18/24 0919   gabapentin (NEURONTIN) capsule 600 mg  600 mg Oral TID Lorri Frederick, MD   600 mg at 01/18/24 2142   hydrOXYzine (ATARAX) tablet 25 mg  25 mg Oral Q6H PRN Onuoha, Chinwendu V, NP       loperamide (IMODIUM) capsule 2-4 mg   2-4 mg Oral PRN Onuoha, Chinwendu V, NP       LORazepam (ATIVAN) tablet 1 mg  1 mg Oral Q6H PRN Onuoha, Chinwendu V, NP   1 mg at 01/18/24 1038   LORazepam (ATIVAN) tablet 2 mg  2 mg Oral TID Carrion-Carrero, Karle Starch, MD       Followed by   Melene Muller ON 01/20/2024] LORazepam (ATIVAN) tablet 2 mg  2 mg Oral BID Carrion-Carrero, Jia Mohamed, MD       Followed by   Melene Muller ON 01/21/2024] LORazepam (ATIVAN) tablet 2 mg  2 mg Oral Daily Carrion-Carrero, Alany Borman, MD       magnesium hydroxide (MILK OF MAGNESIA) suspension 30 mL  30 mL Oral Daily PRN Onuoha, Chinwendu V, NP       multivitamin with minerals tablet 1 tablet  1 tablet Oral Daily Onuoha, Chinwendu V, NP   1 tablet at 01/18/24 0919   ondansetron (ZOFRAN-ODT) disintegrating tablet 4 mg  4 mg Oral Q6H PRN Onuoha, Chinwendu V, NP       QUEtiapine (SEROQUEL) tablet 100 mg  100 mg Oral QHS Carrion-Carrero, Heidemarie Goodnow, MD   100 mg at 01/18/24 2142   thiamine (VITAMIN B1) tablet 100 mg  100 mg Oral Daily Onuoha, Chinwendu V, NP       traZODone (DESYREL) tablet 100 mg  100 mg Oral QHS Carrion-Carrero, Christion Leonhard, MD   100 mg at 01/18/24 2142   Current Outpatient Medications  Medication Sig Dispense Refill   Multiple Vitamin (MULTIVITAMIN) tablet Take 1 tablet by mouth daily.     ondansetron (ZOFRAN) 4 MG tablet Take 1 tablet (4 mg total) by mouth every 6 (six) hours. 12 tablet 0   chlordiazePOXIDE (LIBRIUM) 25 MG capsule 50mg  PO TID x 1D, then 25-50mg  PO BID X 1D, then 25-50mg  PO QD X 1D (Patient not taking: Reported on 01/18/2024) 10 capsule 0   DULoxetine (CYMBALTA) 30 MG capsule Take 30 mg by mouth daily. (Patient not taking: Reported on 01/18/2024)     folic acid (FOLVITE) 1 MG tablet Take 1 mg by mouth daily.     gabapentin (NEURONTIN) 300 MG capsule Take 300 mg by mouth 3 (three) times daily as needed. (Patient not taking: Reported on 01/18/2024)     hydrOXYzine (VISTARIL) 50 MG capsule Take 50 mg by mouth 3 (three) times daily. (Patient not taking: Reported on  01/18/2024)     methocarbamol (ROBAXIN) 500 MG tablet Take 500 mg by mouth 3 (three) times daily as needed. (Patient not taking: Reported on 01/18/2024)     potassium chloride SA (KLOR-CON M) 20 MEQ tablet Take 1 tablet (20 mEq total) by mouth daily for 10 days. (Patient not taking: Reported on 01/18/2024) 10 tablet 0   prazosin (MINIPRESS) 1 MG capsule Take 1 mg by mouth at bedtime. (Patient not taking: Reported on 01/18/2024)     thiamine (VITAMIN B1) 100 MG tablet Take 100 mg by mouth daily. (  Patient not taking: Reported on 01/18/2024)      Labs  Lab Results:  Admission on 01/18/2024  Component Date Value Ref Range Status   POC Amphetamine UR 01/18/2024 None Detected  NONE DETECTED (Cut Off Level 1000 ng/mL) Final   POC Secobarbital (BAR) 01/18/2024 None Detected  NONE DETECTED (Cut Off Level 300 ng/mL) Final   POC Buprenorphine (BUP) 01/18/2024 None Detected  NONE DETECTED (Cut Off Level 10 ng/mL) Final   POC Oxazepam (BZO) 01/18/2024 Positive (A)  NONE DETECTED (Cut Off Level 300 ng/mL) Final   POC Cocaine UR 01/18/2024 None Detected  NONE DETECTED (Cut Off Level 300 ng/mL) Final   POC Methamphetamine UR 01/18/2024 None Detected  NONE DETECTED (Cut Off Level 1000 ng/mL) Final   POC Morphine 01/18/2024 None Detected  NONE DETECTED (Cut Off Level 300 ng/mL) Final   POC Methadone UR 01/18/2024 None Detected  NONE DETECTED (Cut Off Level 300 ng/mL) Final   POC Oxycodone UR 01/18/2024 None Detected  NONE DETECTED (Cut Off Level 100 ng/mL) Final   POC Marijuana UR 01/18/2024 Positive (A)  NONE DETECTED (Cut Off Level 50 ng/mL) Final   Alcohol, Ethyl (B) 01/18/2024 <10  <10 mg/dL Final   Comment: (NOTE) Lowest detectable limit for serum alcohol is 10 mg/dL.  For medical purposes only. Performed at [facility], [address], [place], [place] [address]    Sodium 01/18/2024 138  135 - 145 mmol/L Final   Potassium 01/18/2024 3.7  3.5 - 5.1 mmol/L Final   Chloride 01/18/2024 95 (L)  98 - 111  mmol/L Final   CO2 01/18/2024 26  22 - 32 mmol/L Final   Glucose, Bld 01/18/2024 94  70 - 99 mg/dL Final   Glucose reference range applies only to samples taken after fasting for at least 8 hours.   BUN 01/18/2024 <5 (L)  6 - 20 mg/dL Final   Creatinine, Ser 01/18/2024 0.76  0.61 - 1.24 mg/dL Final   Calcium 08/65/7846 9.2  8.9 - 10.3 mg/dL Final   Total Protein 96/29/5284 7.9  6.5 - 8.1 g/dL Final   Albumin 13/24/4010 3.7  3.5 - 5.0 g/dL Final   AST 27/25/3664 68 (H)  15 - 41 U/L Final   ALT 01/18/2024 23  0 - 44 U/L Final   Alkaline Phosphatase 01/18/2024 111  38 - 126 U/L Final   Total Bilirubin 01/18/2024 1.4 (H)  0.0 - 1.2 mg/dL Final   GFR, Estimated 01/18/2024 >60  >60 mL/min Final   Comment: (NOTE) Calculated using the CKD-EPI Creatinine Equation (2021)    Anion gap 01/18/2024 17 (H)  5 - 15 Final   Performed at [facility], [address] [place], [place] [address]  Admission on 01/16/2024, Discharged on 01/17/2024  Component Date Value Ref Range Status   Sodium 01/17/2024 138  135 - 145 mmol/L Final   Potassium 01/17/2024 3.0 (L)  3.5 - 5.1 mmol/L Final   Chloride 01/17/2024 101  98 - 111 mmol/L Final   CO2 01/17/2024 23  22 - 32 mmol/L Final   Glucose, Bld 01/17/2024 102 (H)  70 - 99 mg/dL Final   Glucose reference range applies only to samples taken after fasting for at least 8 hours.   BUN 01/17/2024 5 (L)  6 - 20 mg/dL Final   Creatinine, Ser 01/17/2024 0.57 (L)  0.61 - 1.24 mg/dL Final   Calcium 42/59/5638 8.4 (L)  8.9 - 10.3 mg/dL Final   Total Protein 75/64/3329 7.8  6.5 - 8.1 g/dL Final   Albumin 40/98/1191 3.8  3.5 - 5.0 g/dL Final   AST 47/82/9562 129 (H)  15 - 41 U/L Final   ALT 01/17/2024 27  0 - 44 U/L Final   Alkaline Phosphatase 01/17/2024 108  38 - 126 U/L Final   Total Bilirubin 01/17/2024 0.6  0.0 - 1.2 mg/dL Final   GFR, Estimated 01/17/2024 >60  >60 mL/min Final   Comment: (NOTE) Calculated using the CKD-EPI Creatinine Equation (2021)     Anion gap 01/17/2024 14  5 - 15 Final   Performed at [facility], [address] [place], [place] [address]   WBC 01/17/2024 3.6 (L)  4.0 - 10.5 K/uL Final   RBC 01/17/2024 3.54 (L)  4.22 - 5.81 MIL/uL Final   Hemoglobin 01/17/2024 10.9 (L)  13.0 - 17.0 g/dL Final   HCT 57/84/6962 33.8 (L)  39.0 - 52.0 % Final   MCV 01/17/2024 95.5  80.0 - 100.0 fL Final   MCH 01/17/2024 30.8  26.0 - 34.0 pg Final   MCHC 01/17/2024 32.2  30.0 - 36.0 g/dL Final   RDW 95/28/4132 14.8  11.5 - 15.5 % Final   Platelets 01/17/2024 218  150 - 400 K/uL Final   nRBC 01/17/2024 0.0  0.0 - 0.2 % Final   Neutrophils Relative % 01/17/2024 58  % Final   Neutro Abs 01/17/2024 2.1  1.7 - 7.7 K/uL Final   Lymphocytes Relative 01/17/2024 29  % Final   Lymphs Abs 01/17/2024 1.0  0.7 - 4.0 K/uL Final   Monocytes Relative 01/17/2024 10  % Final   Monocytes Absolute 01/17/2024 0.4  0.1 - 1.0 K/uL Final   Eosinophils Relative 01/17/2024 2  % Final   Eosinophils Absolute 01/17/2024 0.1  0.0 - 0.5 K/uL Final   Basophils Relative 01/17/2024 1  % Final   Basophils Absolute 01/17/2024 0.1  0.0 - 0.1 K/uL Final   Immature Granulocytes 01/17/2024 0  % Final   Abs Immature Granulocytes 01/17/2024 0.01  0.00 - 0.07 K/uL Final   Performed at [facility], [address] [place], [place] [address]   Alcohol, Ethyl (B) 01/17/2024 499 (HH)  <10 mg/dL Final   Comment: CRITICAL RESULT CALLED TO, READ BACK BY AND VERIFIED WITH MAYWEATHER, S. RN AT 0132 ON 3.5.25. FA (NOTE) Lowest detectable limit for serum alcohol is 10 mg/dL.  For medical purposes only. Performed at [facility], [address] [place], [place] [address]     Blood Alcohol level:  Lab Results  Component Value Date   ETH <10 01/18/2024   ETH 499 (HH) 01/17/2024    Metabolic Disorder Labs: Lab Results  Component Value Date   HGBA1C 5.0 12/09/2021   MPG 97 12/09/2021   No results found for:  "PROLACTIN" Lab Results  Component Value Date   CHOL 179 12/09/2021   TRIG 90 12/09/2021   HDL 44 12/09/2021   CHOLHDL 4.1 12/09/2021   VLDL 18 12/09/2021   LDLCALC 117 (H) 12/09/2021    Therapeutic Lab Levels: No results found for: "LITHIUM" No results found for: "VALPROATE" No results found for: "CBMZ"  Physical Findings   AIMS    Flowsheet Row [facility]  AIMS Total Score 0      AUDIT    Flowsheet Row Ad[facility] (Discharged[facility]  Alcohol Use Disorder Identification Test Final Score (  AUDIT) 29      CAGE-AID    Flowsheet Row ED to Hosp-Admission (Discharged) from 06/22/2021 in [facility]  CAGE-AID Score 3      PHQ2-9    Flowsheet Row ED from 01/18/2024 in [facility] Office Visit from 06/10/2022 in [facility] [place]. [facility] Office Visit from 12/21/2021 in [facility] Of [place]. [facility] ED from 12/07/2021 in [facility] Emergency Department at [facility] Office Visit from 06/30/2021 in [facility] [place]. [facility]  PHQ-2 Total Score 3 0 0 1 0  PHQ-9 Total Score 15 -- 3 6 2       Flowsheet Row ED from 01/18/2024 in [facility] Most recent reading at 01/18/2024  3:05 AM ED from 01/17/2024 in [facility] Most recent reading at 01/17/2024 10:42 PM ED from 01/17/2024 in [facility] Emergency Department at [facility] Most recent reading at 01/17/2024  7:05 PM  C-SSRS RISK CATEGORY No Risk Error: Question 2 not populated No Risk        Musculoskeletal  Strength & Muscle Tone: within normal limits Gait & Station: normal Patient leans: N/A  Psychiatric Specialty Exam  Presentation  General  Appearance:  Casual  Eye Contact: Fair  Speech: Clear and Coherent  Speech Volume: Normal  Handedness: Not assessed   Mood and Affect  Mood: Anxious  Affect: Congruent   Thought Process  Thought Processes: Goal Directed; Coherent  Descriptions of Associations:Intact  Orientation:Full (Time, Place and Person)  Thought Content:WDL  Diagnosis of Schizophrenia or Schizoaffective disorder in past: No    Hallucinations:Hallucinations: None  Ideas of Reference:None  Suicidal Thoughts:Suicidal Thoughts: No  Homicidal Thoughts:Homicidal Thoughts: No   Sensorium  Memory: Immediate Fair  Judgment: Poor  Insight: Lacking   Executive Functions  Concentration: Fair  Attention Span: Fair  Recall: [employer] of Knowledge: Fair  Language: Fair   Psychomotor Activity  Psychomotor Activity: Psychomotor Activity: Tremor   Assets  Assets: Communication Skills; Desire for Improvement   Sleep  Sleep: Sleep: Poor   Nutritional Assessment (For OBS and FBC admissions only) Has the patient had a weight loss or gain of 10 pounds or more in the last 3 months?: No Has the patient had a decrease in food intake/or appetite?: No Does the patient have dental problems?: No Does the patient have eating habits or behaviors that may be indicators of an eating disorder including binging or inducing vomiting?: No Has the patient recently lost weight without trying?: 0 Has the patient been eating poorly because of a decreased appetite?: 0 Malnutrition Screening Tool Score: 0    Physical Exam  Physical Exam Vitals and nursing note reviewed.  Constitutional:      General: He is not in acute distress.    Appearance: He is not ill-appearing.  HENT:     Head: Normocephalic and atraumatic.  Eyes:     Extraocular Movements: Extraocular movements intact.     Conjunctiva/sclera: Conjunctivae normal.  Pulmonary:     Effort: Pulmonary effort is normal. No  respiratory distress.  Skin:    General: Skin is warm and dry.  Neurological:     General: No focal deficit present.    Review of Systems  All other systems reviewed and are negative.  Blood pressure 106/65,  pulse (!) 110, temperature 98 F (36.7 C), temperature source Oral, resp. rate 18, SpO2 96%. There is no height or weight on file to calculate BMI.  Treatment Plan Summary: Daily contact with patient to assess and evaluate symptoms and progress in treatment and Medication management  Alcohol use disorder Alcohol Use Disorder Last CIWA score recorded is 5 on 01/18/2024 at 7:38 PM Vitals reviewed: normotensive with tachycardia 110 at 6AM -Continue Modified Ativan taper -Increase gabapentin 600 mg to milligrams 3 times daily, with plan to taper this off by discharge -CIWA with Ativan/ as needed for CIWA greater than 10 -Thiamine 100 mg IM first day and PO after that -Multivitamin with minerals daily -Tylenol 650 mg every 6 hours as needed for pain -Zofran 4 mg every 6 hours as needed for nausea or vomiting -Imodium 2 to 4 mg as needed for diarrhea or loose stools  -Maalox/Mylanta 30 mL every 4 hours as needed for indigestion -Milk of Mag 30 mL as needed for constipation   Depression versus substance-induced mood disorder Cymbalta 20 mg daily Seroquel 100 mg nightly Trazodone 100 mg nightly  CMP showing hypokalemia 3.0 --repeat CMP showing potassium 3.7 status post 1 X40 mill equivalents of KCl Elevated LFTs with AST 129, ALT 27   Signed: Lorri Frederick, MD 01/19/2024 8:18 AM

## 2024-01-19 NOTE — Group Note (Signed)
 Group Topic: Recovery Basics  Group Date: 01/19/2024 Start Time: 1000 End Time: 1040 Facilitators: Vicki Mallet, NT  Department: [facility]  Number of Participants: 8  Group Focus: goals/reality orientation Treatment Modality:  Psychoeducation Interventions utilized were patient education Purpose: reinforce self-care  Name: Xavier Owens Date of Birth: [birth year]  MR: [id number]    Level of Participation: active Quality of Participation: cooperative Interactions with others: gave feedback Mood/Affect: appropriate Triggers (if applicable): NA Cognition: coherent/clear Progress: Moderate Response: NA Plan: follow-up needed  Patients Problems:  Patient Active Problem List   Diagnosis Date Noted   Alcohol use disorder, severe, dependence (HCC) 01/18/2024   Healthcare maintenance 06/10/2022   Nightmares 12/21/2021   Subclinical hypothyroidism 12/21/2021   Substance induced mood disorder (HCC) 12/14/2021   Alcohol use disorder 06/23/2021   Seizures (HCC) 06/22/2021   Elevated liver enzymes 04/19/2016   Essential hypertension 04/19/2016

## 2024-01-20 MED ORDER — PROPRANOLOL HCL 10 MG PO TABS
10.0000 mg | ORAL_TABLET | Freq: Two times a day (BID) | ORAL | Status: DC
  Administered 2024-01-20 (×2): 10 mg via ORAL
  Filled 2024-01-20 (×3): qty 1

## 2024-01-20 NOTE — ED Notes (Signed)
 PRN atarax given due to patient reports of anxiety. Medication administered with no complications. Environment secured, safety checks in place per facility policy.

## 2024-01-20 NOTE — Group Note (Signed)
 Group Topic: Relaxation  Group Date: 01/20/2024 Start Time: 1000 End Time: 1100 Facilitators: Londell Moh, NT  Department: [facility]  Number of Participants: 10  Group Focus: art therapy and check in Treatment Modality:  Art Therapy and Psychoeducation Interventions utilized were group exercise Purpose: enhance coping skills  Name: Xavier Owens Date of Birth: 1971-05-12  MR: [id number]    Level of Participation: Pt did not attend group. Patients Problems:  Patient Active Problem List   Diagnosis Date Noted   Alcohol use disorder, severe, dependence (HCC) 01/18/2024   Healthcare maintenance 06/10/2022   Nightmares 12/21/2021   Subclinical hypothyroidism 12/21/2021   Substance induced mood disorder (HCC) 12/14/2021   Alcohol use disorder 06/23/2021   Seizures (HCC) 06/22/2021   Elevated liver enzymes 04/19/2016   Essential hypertension 04/19/2016

## 2024-01-20 NOTE — ED Notes (Signed)
 Patient sitting in dayroom interacting with peers, patient is eating lunch. No acute distress noted. No concerns voiced. Informed patient to notify staff with any needs or assistance. Patient verbalized understanding or agreement. Safety checks in place per facility policy.

## 2024-01-20 NOTE — ED Notes (Signed)
 Patient is in the bedroom sleeping. NAD. Respirations even and unlabored. Will monitor for safety.

## 2024-01-20 NOTE — Group Note (Signed)
 Group Topic: Relapse and Recovery  Group Date: 01/20/2024 Start Time: 2005 End Time: 2100 Facilitators: Darin Engels  Department: [facility]  Number of Participants: 4  Group Focus: acceptance, coping skills, goals/reality orientation, relapse prevention, self-awareness, and substance abuse education Treatment Modality:  Leisure Development Interventions utilized were reminiscence, story telling, and support Purpose: enhance coping skills, express feelings, increase insight, relapse prevention strategies, and trigger / craving management  Name: Xavier Owens Date of Birth: 1971-03-09  MR: [id number]    Level of Participation: active Quality of Participation: attentive, cooperative, and supportive Interactions with others: gave feedback Mood/Affect: appropriate and positive Triggers (if applicable): n/a Cognition: coherent/clear Progress: Gaining insight Response: pt supported other participants by listening and offering feedback and support.  Plan: patient will be encouraged to continue attending groups.   Patients Problems:  Patient Active Problem List   Diagnosis Date Noted   Alcohol use disorder, severe, dependence (HCC) 01/18/2024   Healthcare maintenance 06/10/2022   Nightmares 12/21/2021   Subclinical hypothyroidism 12/21/2021   Substance induced mood disorder (HCC) 12/14/2021   Alcohol use disorder 06/23/2021   Seizures (HCC) 06/22/2021   Elevated liver enzymes 04/19/2016   Essential hypertension 04/19/2016

## 2024-01-20 NOTE — ED Notes (Signed)
 Patient alert & oriented x4. Denies intent to harm self or others when asked. Denies A/VH. Patient reports neuropathic pain in feet rating 10/10. PRN Tylenol as well as scheduled Gabapentin. Scheduled and PRN medications administered with no complications.Pain level unchanged. No acute distress noted. Support and encouragement provided. Routine safety checks conducted per facility protocol. Encouraged patient to notify staff if any thoughts of harm towards self or others arise. Patient verbalizes understanding and agreement.

## 2024-01-20 NOTE — ED Notes (Signed)
 PRN Tylenol given due to patient reports of pain rating 10/10. Medication administered with no complications. Environment secured, safety checks in place per facility policy.

## 2024-01-20 NOTE — ED Notes (Signed)
 Pt was provided breakfast.

## 2024-01-20 NOTE — ED Notes (Signed)
 Patient is resting in bed with eyes closed, even unlabored breathing. No s/s of discomfort. Patient will be continued to be monitored for safety per protocol and for changes in condition.

## 2024-01-20 NOTE — ED Notes (Signed)
 Patient sitting in dayroom interacting with peers. No acute distress noted. No concerns voiced. Informed patient to notify staff with any needs or assistance. Patient verbalized understanding or agreement. Safety checks in place per facility policy.

## 2024-01-20 NOTE — ED Provider Notes (Signed)
 Behavioral Health Progress Note  Date and Time: 01/20/2024 11:17 AM Name: Xavier Owens MRN:  [id number]  Reason for admission: Xavier Owens is a 53 year old male with psychiatric history of alcohol abuse, substance-induced mood disorder, and medical history of seizures, elevated liver enzymes and essential hypertension, who presented voluntarily to [facility] requesting alcohol abuse treatment/detox.  UDS is positive for benzodiazepines and cannabis.  Subjective:   Patient was evaluated in his room, reports he is still experiencing withdrawal symptoms today but feels that they are improving.  Reports that the tremor is still there and was amenable to starting propranolol to better address tremors.  Denies history of asthma.  Denies SI/HI/AVH.  We discussed naltrexone as well and he was amenable with starting this tomorrow night and continue if tolerated.  We touched base on outpatient options for treatment of alcohol use disorder.  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  He was amenable to substance use counseling.  On interview, suicidal ideations are not present. Thoughts of self harm are not present. Homicidal ideations are not present.   There are no auditory hallucinations, visual hallucinations, paranoid ideations, or delusional thought processes.   Side effects to currently prescribed medications are none.  Diagnosis:  Final diagnoses:  Alcohol use disorder, severe, dependence (HCC)    Total Time spent with patient: 30 minutes  Substance Use History: He reports history of alcohol abuse for the past 30 years and drinks 1/5th of Vodka daily, last drink yesterday.  He reports a history of withdrawal seizures and DT's.  --was in a residential rehab program named [facility] in [place].   Past Psychiatric History:  He is currently not established with an outpatient  psychiatrist or therapist.  No formal past psychiatric history Past Medical History: No significant past medical history.  No Family History: Patient Family Psychiatric  History: Unknown to patient Social History: See above   Pain Medications: See MAR Prescriptions: See MAR Over the Counter: See MAR History of alcohol / drug use?: Yes Longest period of sobriety (when/how long): Pt reports, 30 days. Negative Consequences of Use: Legal Withdrawal Symptoms: Tremors Name of Substance 1: Alcohol. 1 - Age of First Use: 15. 1 - Amount (size/oz): Pt reports, drinking a fifth of Vodka Tueday (01/16/2024) 1 - Frequency: Pt reports, he drinks daily. 1 - Duration: Ongoing. 1 - Last Use / Amount: Tuesday (01/16/2024). 1 - Method of Aquiring: Purchase 1- Route of Use: Oral.     Current Medications:  Current Facility-Administered Medications  Medication Dose Route Frequency Provider Last Rate Last Admin   acetaminophen (TYLENOL) tablet 650 mg  650 mg Oral Q6H PRN Onuoha, Chinwendu V, NP   650 mg at 01/20/24 0953   alum & mag hydroxide-simeth (MAALOX/MYLANTA) 200-200-20 MG/5ML suspension 30 mL  30 mL Oral Q4H PRN Onuoha, Chinwendu V, NP       haloperidol (HALDOL) tablet 5 mg  5 mg Oral TID PRN Onuoha, Chinwendu V, NP       And   diphenhydrAMINE (BENADRYL) capsule 50 mg  50 mg Oral TID PRN Onuoha, Chinwendu V, NP       haloperidol lactate (HALDOL) injection 5 mg  5 mg Intramuscular TID PRN Onuoha, Chinwendu V, NP       And   diphenhydrAMINE (BENADRYL) injection 50 mg  50 mg Intramuscular TID PRN Onuoha, Chinwendu V, NP       And  LORazepam (ATIVAN) injection 2 mg  2 mg Intramuscular TID PRN Onuoha, Chinwendu V, NP       haloperidol lactate (HALDOL) injection 10 mg  10 mg Intramuscular TID PRN Onuoha, Chinwendu V, NP       And   diphenhydrAMINE (BENADRYL) injection 50 mg  50 mg Intramuscular TID PRN Onuoha, Chinwendu V, NP       And   LORazepam (ATIVAN) injection 2 mg  2 mg Intramuscular  TID PRN Onuoha, Chinwendu V, NP       DULoxetine (CYMBALTA) DR capsule 20 mg  20 mg Oral Daily Onuoha, Chinwendu V, NP   20 mg at 01/20/24 0951   gabapentin (NEURONTIN) capsule 800 mg  800 mg Oral TID Lorri Frederick, MD   800 mg at 01/20/24 0951   hydrOXYzine (ATARAX) tablet 25 mg  25 mg Oral Q6H PRN Onuoha, Chinwendu V, NP   25 mg at 01/20/24 3086   loperamide (IMODIUM) capsule 2-4 mg  2-4 mg Oral PRN Onuoha, Chinwendu V, NP       LORazepam (ATIVAN) tablet 1 mg  1 mg Oral Q6H PRN Onuoha, Chinwendu V, NP   1 mg at 01/18/24 1038   LORazepam (ATIVAN) tablet 2 mg  2 mg Oral TID Carrion-Carrero, Karle Starch, MD       Followed by   Melene Muller ON 01/21/2024] LORazepam (ATIVAN) tablet 2 mg  2 mg Oral BID Carrion-Carrero, Margely, MD       magnesium hydroxide (MILK OF MAGNESIA) suspension 30 mL  30 mL Oral Daily PRN Onuoha, Chinwendu V, NP       multivitamin with minerals tablet 1 tablet  1 tablet Oral Daily Onuoha, Chinwendu V, NP   1 tablet at 01/20/24 0951   ondansetron (ZOFRAN-ODT) disintegrating tablet 4 mg  4 mg Oral Q6H PRN Onuoha, Chinwendu V, NP       propranolol (INDERAL) tablet 10 mg  10 mg Oral BID Park Pope, MD       QUEtiapine (SEROQUEL) tablet 100 mg  100 mg Oral QHS Carrion-Carrero, Margely, MD   100 mg at 01/19/24 2103   thiamine (VITAMIN B1) tablet 100 mg  100 mg Oral Daily Onuoha, Chinwendu V, NP   100 mg at 01/20/24 0951   traZODone (DESYREL) tablet 100 mg  100 mg Oral QHS Carrion-Carrero, Margely, MD   100 mg at 01/19/24 2104   Current Outpatient Medications  Medication Sig Dispense Refill   Multiple Vitamin (MULTIVITAMIN) tablet Take 1 tablet by mouth daily.     ondansetron (ZOFRAN) 4 MG tablet Take 1 tablet (4 mg total) by mouth every 6 (six) hours. 12 tablet 0   chlordiazePOXIDE (LIBRIUM) 25 MG capsule 50mg  PO TID x 1D, then 25-50mg  PO BID X 1D, then 25-50mg  PO QD X 1D (Patient not taking: Reported on 01/18/2024) 10 capsule 0   DULoxetine (CYMBALTA) 30 MG capsule Take 30 mg by  mouth daily. (Patient not taking: Reported on 01/18/2024)     folic acid (FOLVITE) 1 MG tablet Take 1 mg by mouth daily.     gabapentin (NEURONTIN) 300 MG capsule Take 300 mg by mouth 3 (three) times daily as needed. (Patient not taking: Reported on 01/18/2024)     hydrOXYzine (VISTARIL) 50 MG capsule Take 50 mg by mouth 3 (three) times daily. (Patient not taking: Reported on 01/18/2024)     methocarbamol (ROBAXIN) 500 MG tablet Take 500 mg by mouth 3 (three) times daily as needed. (Patient not taking: Reported on 01/18/2024)  potassium chloride SA (KLOR-CON M) 20 MEQ tablet Take 1 tablet (20 mEq total) by mouth daily for 10 days. (Patient not taking: Reported on 01/18/2024) 10 tablet 0   prazosin (MINIPRESS) 1 MG capsule Take 1 mg by mouth at bedtime. (Patient not taking: Reported on 01/18/2024)     thiamine (VITAMIN B1) 100 MG tablet Take 100 mg by mouth daily. (Patient not taking: Reported on 01/18/2024)      Labs  Lab Results:  Admission on 01/18/2024  Component Date Value Ref Range Status   POC Amphetamine UR 01/18/2024 None Detected  NONE DETECTED (Cut Off Level 1000 ng/mL) Final   POC Secobarbital (BAR) 01/18/2024 None Detected  NONE DETECTED (Cut Off Level 300 ng/mL) Final   POC Buprenorphine (BUP) 01/18/2024 None Detected  NONE DETECTED (Cut Off Level 10 ng/mL) Final   POC Oxazepam (BZO) 01/18/2024 Positive (A)  NONE DETECTED (Cut Off Level 300 ng/mL) Final   POC Cocaine UR 01/18/2024 None Detected  NONE DETECTED (Cut Off Level 300 ng/mL) Final   POC Methamphetamine UR 01/18/2024 None Detected  NONE DETECTED (Cut Off Level 1000 ng/mL) Final   POC Morphine 01/18/2024 None Detected  NONE DETECTED (Cut Off Level 300 ng/mL) Final   POC Methadone UR 01/18/2024 None Detected  NONE DETECTED (Cut Off Level 300 ng/mL) Final   POC Oxycodone UR 01/18/2024 None Detected  NONE DETECTED (Cut Off Level 100 ng/mL) Final   POC Marijuana UR 01/18/2024 Positive (A)  NONE DETECTED (Cut Off Level 50 ng/mL) Final    Alcohol, Ethyl (B) 01/18/2024 <10  <10 mg/dL Final   Comment: (NOTE) Lowest detectable limit for serum alcohol is 10 mg/dL.  For medical purposes only. Performed at [facility], [address], [place], [place] [address]    Sodium 01/18/2024 138  135 - 145 mmol/L Final   Potassium 01/18/2024 3.7  3.5 - 5.1 mmol/L Final   Chloride 01/18/2024 95 (L)  98 - 111 mmol/L Final   CO2 01/18/2024 26  22 - 32 mmol/L Final   Glucose, Bld 01/18/2024 94  70 - 99 mg/dL Final   Glucose reference range applies only to samples taken after fasting for at least 8 hours.   BUN 01/18/2024 <5 (L)  6 - 20 mg/dL Final   Creatinine, Ser 01/18/2024 0.76  0.61 - 1.24 mg/dL Final   Calcium 60/45/4098 9.2  8.9 - 10.3 mg/dL Final   Total Protein 11/91/4782 7.9  6.5 - 8.1 g/dL Final   Albumin 95/62/1308 3.7  3.5 - 5.0 g/dL Final   AST 65/78/4696 68 (H)  15 - 41 U/L Final   ALT 01/18/2024 23  0 - 44 U/L Final   Alkaline Phosphatase 01/18/2024 111  38 - 126 U/L Final   Total Bilirubin 01/18/2024 1.4 (H)  0.0 - 1.2 mg/dL Final   GFR, Estimated 01/18/2024 >60  >60 mL/min Final   Comment: (NOTE) Calculated using the CKD-EPI Creatinine Equation (2021)    Anion gap 01/18/2024 17 (H)  5 - 15 Final   Performed at [facility], [address] [place], [place] [address]  Admission on 01/16/2024, Discharged on 01/17/2024  Component Date Value Ref Range Status   Sodium 01/17/2024 138  135 - 145 mmol/L Final   Potassium 01/17/2024 3.0 (L)  3.5 - 5.1 mmol/L Final   Chloride 01/17/2024 101  98 - 111 mmol/L Final   CO2 01/17/2024 23  22 - 32 mmol/L Final   Glucose, Bld 01/17/2024 102 (H)  70 - 99 mg/dL Final   Glucose reference range applies only to samples taken after fasting for at least 8 hours.   BUN 01/17/2024 5 (L)  6 - 20 mg/dL Final   Creatinine, Ser 01/17/2024 0.57 (L)  0.61 - 1.24 mg/dL Final   Calcium 40/98/1191 8.4 (L)  8.9 - 10.3 mg/dL Final   Total Protein 47/82/9562 7.8  6.5 - 8.1 g/dL Final    Albumin 13/06/6577 3.8  3.5 - 5.0 g/dL Final   AST 46/96/2952 129 (H)  15 - 41 U/L Final   ALT 01/17/2024 27  0 - 44 U/L Final   Alkaline Phosphatase 01/17/2024 108  38 - 126 U/L Final   Total Bilirubin 01/17/2024 0.6  0.0 - 1.2 mg/dL Final   GFR, Estimated 01/17/2024 >60  >60 mL/min Final   Comment: (NOTE) Calculated using the CKD-EPI Creatinine Equation (2021)    Anion gap 01/17/2024 14  5 - 15 Final   Performed at [facility], [address] [place], [place] [address]   WBC 01/17/2024 3.6 (L)  4.0 - 10.5 K/uL Final   RBC 01/17/2024 3.54 (L)  4.22 - 5.81 MIL/uL Final   Hemoglobin 01/17/2024 10.9 (L)  13.0 - 17.0 g/dL Final   HCT 44/11/270 33.8 (L)  39.0 - 52.0 % Final   MCV 01/17/2024 95.5  80.0 - 100.0 fL Final   MCH 01/17/2024 30.8  26.0 - 34.0 pg Final   MCHC 01/17/2024 32.2  30.0 - 36.0 g/dL Final   RDW 53/66/4403 14.8  11.5 - 15.5 % Final   Platelets 01/17/2024 218  150 - 400 K/uL Final   nRBC 01/17/2024 0.0  0.0 - 0.2 % Final   Neutrophils Relative % 01/17/2024 58  % Final   Neutro Abs 01/17/2024 2.1  1.7 - 7.7 K/uL Final   Lymphocytes Relative 01/17/2024 29  % Final   Lymphs Abs 01/17/2024 1.0  0.7 - 4.0 K/uL Final   Monocytes Relative 01/17/2024 10  % Final   Monocytes Absolute 01/17/2024 0.4  0.1 - 1.0 K/uL Final   Eosinophils Relative 01/17/2024 2  % Final   Eosinophils Absolute 01/17/2024 0.1  0.0 - 0.5 K/uL Final   Basophils Relative 01/17/2024 1  % Final   Basophils Absolute 01/17/2024 0.1  0.0 - 0.1 K/uL Final   Immature Granulocytes 01/17/2024 0  % Final   Abs Immature Granulocytes 01/17/2024 0.01  0.00 - 0.07 K/uL Final   Performed at [facility], [address] [place], [place] [address]   Alcohol, Ethyl (B) 01/17/2024 499 (HH)  <10 mg/dL Final   Comment: CRITICAL RESULT CALLED TO, READ BACK BY AND VERIFIED WITH MAYWEATHER, S. RN AT 0132 ON 3.5.25. FA (NOTE) Lowest detectable limit for serum alcohol is 10  mg/dL.  For medical purposes only. Performed at [facility], [address], [place], [place] [address]     Blood Alcohol level:  Lab Results  Component Value Date   ETH <10 01/18/2024   ETH 499 (HH) 01/17/2024    Metabolic Disorder Labs: Lab Results  Component Value Date   HGBA1C 5.0 12/09/2021   MPG 97 12/09/2021   No results found for: "PROLACTIN" Lab Results  Component Value Date   CHOL 179 12/09/2021   TRIG 90 12/09/2021   HDL 44 12/09/2021   CHOLHDL 4.1 12/09/2021   VLDL 18 12/09/2021   LDLCALC 117 (H) 12/09/2021    Therapeutic Lab Levels: No results found for: "LITHIUM" No  results found for: "VALPROATE" No results found for: "CBMZ"  Physical Findings   AIMS    Flowsheet Row [facility]  AIMS Total Score 0      AUDIT    Flowsheet Row Ad[facility] (Discharged[facility]  Alcohol Use Disorder Identification Test Final Score (AUDIT) 29      CAGE-AID    Flowsheet Row ED to Hosp-Admission (Discharged) from 06/22/2021 in [facility]  CAGE-AID Score 3      PHQ2-9    Flowsheet Row ED from 01/18/2024 in [facility] Office Visit from 06/10/2022 in [facility] Of [place]. [facility] Office Visit from 12/21/2021 in [facility] - A Dept Of [place]. [facility] ED from 12/07/2021 in [facility] Emergency Department at [facility] Office Visit from 06/30/2021 in [facility] - [facility] Dept Of [place]. [facility]  PHQ-2 Total Score 3 0 0 1 0  PHQ-9 Total Score 15 -- 3 6 2       Flowsheet Row ED from 01/18/2024 in [facility] Most recent reading at 01/18/2024  3:05 AM ED from 01/17/2024 in [facility] Most recent reading at  01/17/2024 10:42 PM ED from 01/17/2024 in [facility] Emergency Department at [facility] Most recent reading at 01/17/2024  7:05 PM  C-SSRS RISK CATEGORY No Risk Error: Question 2 not populated No Risk        Musculoskeletal  Strength & Muscle Tone: within normal limits Gait & Station: normal Patient leans: N/A  Psychiatric Specialty Exam  Presentation  General Appearance:  Casual  Eye Contact: Fair  Speech: Clear and Coherent  Speech Volume: Normal  Handedness: Not assessed   Mood and Affect  Mood: Anxious  Affect: Congruent   Thought Process  Thought Processes: Goal Directed; Coherent  Descriptions of Associations:Intact  Orientation:Full (Time, Place and Person)  Thought Content:WDL  Diagnosis of Schizophrenia or Schizoaffective disorder in past: No    Hallucinations:No data recorded  Ideas of Reference:None  Suicidal Thoughts:No data recorded  Homicidal Thoughts:No data recorded   Sensorium  Memory: Immediate Fair  Judgment: Poor  Insight: Lacking   Executive Functions  Concentration: Fair  Attention Span: Fair  Recall: [employer] of Knowledge: Fair  Language: Fair   Psychomotor Activity  Psychomotor Activity: No data recorded   Assets  Assets: Communication Skills; Desire for Improvement   Sleep  Sleep: No data recorded   No data recorded   Physical Exam  Physical Exam Vitals and nursing note reviewed.  Constitutional:      General: He is not in acute distress.    Appearance: He is not ill-appearing.  HENT:     Head: Normocephalic and atraumatic.  Eyes:     Extraocular Movements: Extraocular movements intact.     Conjunctiva/sclera: Conjunctivae normal.  Pulmonary:     Effort: Pulmonary effort is normal. No respiratory distress.  Skin:    General: Skin is warm and dry.  Neurological:     General: No focal deficit present.    Review of Systems  All other systems reviewed and are  negative.  Blood pressure 110/75, pulse 100, temperature 98.4 F (36.9 C), temperature source Oral, resp. rate 18, SpO2 96%. There is no height or weight on file to  calculate BMI.  Treatment Plan Summary: Daily contact with patient to assess and evaluate symptoms and progress in treatment and Medication management  Alcohol Use Disorder Last CIWA score 5>4 Vitals reviewed: normotensive with tachycardia 100 at 6AM -Continue Modified Ativan taper -Continue gabapentin 600 mg to milligrams 3 times daily, with plan to taper this off by discharge -CIWA with Ativan/ as needed for CIWA greater than 10 -Thiamine 100 mg IM first day and PO after that -Multivitamin with minerals daily -Tylenol 650 mg every 6 hours as needed for pain -Zofran 4 mg every 6 hours as needed for nausea or vomiting -Imodium 2 to 4 mg as needed for diarrhea or loose stools  -Maalox/Mylanta 30 mL every 4 hours as needed for indigestion -Milk of Mag 30 mL as needed for constipation  Essential tremor -Start propranolol 10 mg twice daily  -Denies history of asthma  Depression versus substance-induced mood disorder Cymbalta 20 mg daily Seroquel 100 mg nightly Trazodone 100 mg nightly  CMP showing hypokalemia 3.0 --repeat CMP showing potassium 3.7 status post 1 X40 mill equivalents of KCl Elevated LFTs with AST 129, ALT 27   Signed: Park Pope, MD 01/20/2024 11:17 AM

## 2024-01-20 NOTE — ED Notes (Signed)
 Pt was provided dinner.

## 2024-01-20 NOTE — ED Notes (Signed)
 Pt was provided lunch

## 2024-01-20 NOTE — ED Notes (Signed)
 Patient resting in bedroom, calm and composed. No acute distress noted. No concerns voiced. Informed patient to notify staff with any needs or assistance. Patient verbalized understanding or agreement. Safety checks in place per facility policy.

## 2024-01-21 LAB — CBC
HCT: 36.1 % — ABNORMAL LOW (ref 39.0–52.0)
Hemoglobin: 11.9 g/dL — ABNORMAL LOW (ref 13.0–17.0)
MCH: 31.7 pg (ref 26.0–34.0)
MCHC: 33 g/dL (ref 30.0–36.0)
MCV: 96.3 fL (ref 80.0–100.0)
Platelets: 261 10*3/uL (ref 150–400)
RBC: 3.75 MIL/uL — ABNORMAL LOW (ref 4.22–5.81)
RDW: 14.3 % (ref 11.5–15.5)
WBC: 5.5 10*3/uL (ref 4.0–10.5)
nRBC: 0 % (ref 0.0–0.2)

## 2024-01-21 LAB — COMPREHENSIVE METABOLIC PANEL
ALT: 33 U/L (ref 0–44)
AST: 78 U/L — ABNORMAL HIGH (ref 15–41)
Albumin: 3.8 g/dL (ref 3.5–5.0)
Alkaline Phosphatase: 90 U/L (ref 38–126)
Anion gap: 12 (ref 5–15)
BUN: 8 mg/dL (ref 6–20)
CO2: 23 mmol/L (ref 22–32)
Calcium: 9.9 mg/dL (ref 8.9–10.3)
Chloride: 99 mmol/L (ref 98–111)
Creatinine, Ser: 0.69 mg/dL (ref 0.61–1.24)
GFR, Estimated: >60 mL/min (ref >60)
Glucose, Bld: 80 mg/dL (ref 70–99)
Potassium: 4.1 mmol/L (ref 3.5–5.1)
Sodium: 134 mmol/L — ABNORMAL LOW (ref 135–145)
Total Bilirubin: 0.7 mg/dL (ref 0.0–1.2)
Total Protein: 7.8 g/dL (ref 6.5–8.1)

## 2024-01-21 LAB — PROTIME-INR
INR: 1 (ref 0.8–1.2)
Prothrombin Time: 13 s (ref 11.4–15.2)

## 2024-01-21 LAB — MAGNESIUM: Magnesium: 1.9 mg/dL (ref 1.7–2.4)

## 2024-01-21 MED ORDER — CHLORDIAZEPOXIDE HCL 25 MG PO CAPS
50.0000 mg | ORAL_CAPSULE | Freq: Two times a day (BID) | ORAL | Status: DC
  Administered 2024-01-21: 50 mg via ORAL
  Filled 2024-01-21 (×2): qty 2

## 2024-01-21 MED ORDER — PROPRANOLOL HCL 10 MG PO TABS
10.0000 mg | ORAL_TABLET | Freq: Two times a day (BID) | ORAL | Status: DC
  Administered 2024-01-21 – 2024-01-23 (×4): 10 mg via ORAL
  Filled 2024-01-21 (×4): qty 1

## 2024-01-21 MED ORDER — CHLORDIAZEPOXIDE HCL 25 MG PO CAPS: 25.0000 mg | ORAL_CAPSULE | Freq: Two times a day (BID) | ORAL | Status: DC

## 2024-01-21 MED ORDER — CHLORDIAZEPOXIDE HCL 25 MG PO CAPS
25.0000 mg | ORAL_CAPSULE | Freq: Once | ORAL | Status: AC
  Administered 2024-01-21: 25 mg via ORAL

## 2024-01-21 MED ORDER — CHLORDIAZEPOXIDE HCL 25 MG PO CAPS
25.0000 mg | ORAL_CAPSULE | Freq: Two times a day (BID) | ORAL | Status: DC
  Administered 2024-01-21: 25 mg via ORAL
  Filled 2024-01-21 (×2): qty 1

## 2024-01-21 MED ORDER — LORAZEPAM 2 MG/ML IJ SOLN
2.0000 mg | Freq: Once | INTRAMUSCULAR | Status: AC
  Administered 2024-01-21: 2 mg via INTRAMUSCULAR
  Filled 2024-01-21: qty 1

## 2024-01-21 MED ORDER — CHLORDIAZEPOXIDE HCL 25 MG PO CAPS: 25.0000 mg | ORAL_CAPSULE | Freq: Every day | ORAL | Status: DC

## 2024-01-21 NOTE — ED Notes (Signed)
 Pt sleeping at this time. Rise and fall of chest noted. Will continue to monitor CIWA.

## 2024-01-21 NOTE — ED Notes (Signed)
 Patient has increased tremors and some confusion. Vitals are WNL. He has a hx of withdrawal seizures and DT's. NP's notified about the above. Staff will continue to monitor safety per protocol and for changes in his condition.

## 2024-01-21 NOTE — ED Notes (Signed)
 Pt watching TV in the dayroom w/ other pts. Pt in NAD.

## 2024-01-21 NOTE — ED Notes (Signed)
 Patient is seen resting in bed and in the milieu area interacting with peers without any distress noted. He is alert and oriented x3, not to time and is calm and cooperative. He denies SIHI, AH, and reports anxiety and depression 10. He reports seeing double vision. He is wearing his personal clothing. Patient will be continued to be monitored for safety per protocol and for changes in condition.

## 2024-01-21 NOTE — ED Notes (Signed)
 Pt is confused, keeps coming out of room, walking down to the exist, saying he wants to go to the dad.

## 2024-01-21 NOTE — ED Provider Notes (Signed)
 Behavioral Health Progress Note  Date and Time: 01/21/2024 11:38 AM Name: Xavier Owens MRN:  [id number]  Reason for admission: Cassie Henkels is a 53 year old male with psychiatric history of alcohol abuse, substance-induced mood disorder, and medical history of seizures, elevated liver enzymes and essential hypertension, who presented voluntarily to [facility] requesting alcohol abuse treatment/detox.  UDS is positive for benzodiazepines and cannabis.  Subjective:   Patient was evaluated in his room, reports he is still experiencing withdrawal symptoms today and somewhat worse than yesterday.  Reports that the tremor is still there despite initiation of propranolol.  Denies history of asthma. Reports vomiting 2 days ago but none since. Reports double vision that started last night but uncertain why. Is ambulate appropriately still.   On interview, suicidal ideations are not present. Thoughts of self harm are not present. Homicidal ideations are not present.   There are no auditory hallucinations, visual hallucinations, paranoid ideations, or delusional thought processes.   Side effects to currently prescribed medications are none.  Diagnosis:  Final diagnoses:  Alcohol use disorder, severe, dependence (HCC)    Total Time spent with patient: 30 minutes  Substance Use History: He reports history of alcohol abuse for the past 30 years and drinks 1/5th of Vodka daily, last drink yesterday.  He reports a history of withdrawal seizures and DT's.  --was in a residential rehab program named [facility] in [place].   Past Psychiatric History:  He is currently not established with an outpatient psychiatrist or therapist.  No formal past psychiatric history Past Medical History: No significant past medical history.  No Family History: Patient Family Psychiatric  History: Unknown to patient Social History: See above   Pain Medications: See MAR Prescriptions: See  MAR Over the Counter: See MAR History of alcohol / drug use?: Yes Longest period of sobriety (when/how long): Pt reports, 30 days. Negative Consequences of Use: Legal Withdrawal Symptoms: Tremors Name of Substance 1: Alcohol. 1 - Age of First Use: 15. 1 - Amount (size/oz): Pt reports, drinking a fifth of Vodka Tueday (01/16/2024) 1 - Frequency: Pt reports, he drinks daily. 1 - Duration: Ongoing. 1 - Last Use / Amount: Tuesday (01/16/2024). 1 - Method of Aquiring: Purchase 1- Route of Use: Oral.     Current Medications:  Current Facility-Administered Medications  Medication Dose Route Frequency Provider Last Rate Last Admin   acetaminophen (TYLENOL) tablet 650 mg  650 mg Oral Q6H PRN Onuoha, Chinwendu V, NP   650 mg at 01/20/24 0953   alum & mag hydroxide-simeth (MAALOX/MYLANTA) 200-200-20 MG/5ML suspension 30 mL  30 mL Oral Q4H PRN Onuoha, Chinwendu V, NP       chlordiazePOXIDE (LIBRIUM) capsule 25 mg  25 mg Oral BID Park Pope, MD   25 mg at 01/21/24 1046   Followed by   Melene Muller ON 01/23/2024] chlordiazePOXIDE (LIBRIUM) capsule 25 mg  25 mg Oral Q1200 Park Pope, MD       chlordiazePOXIDE (LIBRIUM) capsule 25 mg  25 mg Oral Once Park Pope, MD       haloperidol (HALDOL) tablet 5 mg  5 mg Oral TID PRN Onuoha, Chinwendu V, NP       And   diphenhydrAMINE (BENADRYL) capsule 50 mg  50 mg Oral TID PRN Onuoha, Chinwendu V, NP       haloperidol lactate (HALDOL) injection 5 mg  5 mg Intramuscular TID PRN Onuoha, Chinwendu V, NP       And   diphenhydrAMINE (BENADRYL) injection 50  mg  50 mg Intramuscular TID PRN Onuoha, Chinwendu V, NP       And   LORazepam (ATIVAN) injection 2 mg  2 mg Intramuscular TID PRN Onuoha, Chinwendu V, NP       haloperidol lactate (HALDOL) injection 10 mg  10 mg Intramuscular TID PRN Onuoha, Chinwendu V, NP       And   diphenhydrAMINE (BENADRYL) injection 50 mg  50 mg Intramuscular TID PRN Onuoha, Chinwendu V, NP       And   LORazepam (ATIVAN) injection 2 mg  2 mg  Intramuscular TID PRN Onuoha, Chinwendu V, NP       DULoxetine (CYMBALTA) DR capsule 20 mg  20 mg Oral Daily Onuoha, Chinwendu V, NP   20 mg at 01/21/24 1010   gabapentin (NEURONTIN) capsule 800 mg  800 mg Oral TID Carrion-Carrero, Karle Starch, MD   800 mg at 01/21/24 1010   magnesium hydroxide (MILK OF MAGNESIA) suspension 30 mL  30 mL Oral Daily PRN Onuoha, Chinwendu V, NP       multivitamin with minerals tablet 1 tablet  1 tablet Oral Daily Onuoha, Chinwendu V, NP   1 tablet at 01/21/24 1010   propranolol (INDERAL) tablet 10 mg  10 mg Oral BID Park Pope, MD       QUEtiapine (SEROQUEL) tablet 100 mg  100 mg Oral QHS Carrion-Carrero, Margely, MD   100 mg at 01/20/24 2114   thiamine (VITAMIN B1) tablet 100 mg  100 mg Oral Daily Onuoha, Chinwendu V, NP   100 mg at 01/21/24 1010   traZODone (DESYREL) tablet 100 mg  100 mg Oral QHS Carrion-Carrero, Margely, MD   100 mg at 01/20/24 2114   Current Outpatient Medications  Medication Sig Dispense Refill   Multiple Vitamin (MULTIVITAMIN) tablet Take 1 tablet by mouth daily.     ondansetron (ZOFRAN) 4 MG tablet Take 1 tablet (4 mg total) by mouth every 6 (six) hours. 12 tablet 0   chlordiazePOXIDE (LIBRIUM) 25 MG capsule 50mg  PO TID x 1D, then 25-50mg  PO BID X 1D, then 25-50mg  PO QD X 1D (Patient not taking: Reported on 01/18/2024) 10 capsule 0   DULoxetine (CYMBALTA) 30 MG capsule Take 30 mg by mouth daily. (Patient not taking: Reported on 01/18/2024)     folic acid (FOLVITE) 1 MG tablet Take 1 mg by mouth daily.     gabapentin (NEURONTIN) 300 MG capsule Take 300 mg by mouth 3 (three) times daily as needed. (Patient not taking: Reported on 01/18/2024)     hydrOXYzine (VISTARIL) 50 MG capsule Take 50 mg by mouth 3 (three) times daily. (Patient not taking: Reported on 01/18/2024)     methocarbamol (ROBAXIN) 500 MG tablet Take 500 mg by mouth 3 (three) times daily as needed. (Patient not taking: Reported on 01/18/2024)     potassium chloride SA (KLOR-CON M) 20 MEQ  tablet Take 1 tablet (20 mEq total) by mouth daily for 10 days. (Patient not taking: Reported on 01/18/2024) 10 tablet 0   prazosin (MINIPRESS) 1 MG capsule Take 1 mg by mouth at bedtime. (Patient not taking: Reported on 01/18/2024)     thiamine (VITAMIN B1) 100 MG tablet Take 100 mg by mouth daily. (Patient not taking: Reported on 01/18/2024)      Labs  Lab Results:  Admission on 01/18/2024  Component Date Value Ref Range Status   POC Amphetamine UR 01/18/2024 None Detected  NONE DETECTED (Cut Off Level 1000 ng/mL) Final   POC Secobarbital (BAR) 01/18/2024  None Detected  NONE DETECTED (Cut Off Level 300 ng/mL) Final   POC Buprenorphine (BUP) 01/18/2024 None Detected  NONE DETECTED (Cut Off Level 10 ng/mL) Final   POC Oxazepam (BZO) 01/18/2024 Positive (A)  NONE DETECTED (Cut Off Level 300 ng/mL) Final   POC Cocaine UR 01/18/2024 None Detected  NONE DETECTED (Cut Off Level 300 ng/mL) Final   POC Methamphetamine UR 01/18/2024 None Detected  NONE DETECTED (Cut Off Level 1000 ng/mL) Final   POC Morphine 01/18/2024 None Detected  NONE DETECTED (Cut Off Level 300 ng/mL) Final   POC Methadone UR 01/18/2024 None Detected  NONE DETECTED (Cut Off Level 300 ng/mL) Final   POC Oxycodone UR 01/18/2024 None Detected  NONE DETECTED (Cut Off Level 100 ng/mL) Final   POC Marijuana UR 01/18/2024 Positive (A)  NONE DETECTED (Cut Off Level 50 ng/mL) Final   Alcohol, Ethyl (B) 01/18/2024 <10  <10 mg/dL Final   Comment: (NOTE) Lowest detectable limit for serum alcohol is 10 mg/dL.  For medical purposes only. Performed at [facility], [address] [place], [place] [address]    Sodium 01/18/2024 138  135 - 145 mmol/L Final   Potassium 01/18/2024 3.7  3.5 - 5.1 mmol/L Final   Chloride 01/18/2024 95 (L)  98 - 111 mmol/L Final   CO2 01/18/2024 26  22 - 32 mmol/L Final   Glucose, Bld 01/18/2024 94  70 - 99 mg/dL Final   Glucose reference range applies only to samples taken after fasting for at least 8  hours.   BUN 01/18/2024 <5 (L)  6 - 20 mg/dL Final   Creatinine, Ser 01/18/2024 0.76  0.61 - 1.24 mg/dL Final   Calcium 60/45/4098 9.2  8.9 - 10.3 mg/dL Final   Total Protein 11/91/4782 7.9  6.5 - 8.1 g/dL Final   Albumin 95/62/1308 3.7  3.5 - 5.0 g/dL Final   AST 65/78/4696 68 (H)  15 - 41 U/L Final   ALT 01/18/2024 23  0 - 44 U/L Final   Alkaline Phosphatase 01/18/2024 111  38 - 126 U/L Final   Total Bilirubin 01/18/2024 1.4 (H)  0.0 - 1.2 mg/dL Final   GFR, Estimated 01/18/2024 >60  >60 mL/min Final   Comment: (NOTE) Calculated using the CKD-EPI Creatinine Equation (2021)    Anion gap 01/18/2024 17 (H)  5 - 15 Final   Performed at [facility], [address], [place], [place] [address]  Admission on 01/16/2024, Discharged on 01/17/2024  Component Date Value Ref Range Status   Sodium 01/17/2024 138  135 - 145 mmol/L Final   Potassium 01/17/2024 3.0 (L)  3.5 - 5.1 mmol/L Final   Chloride 01/17/2024 101  98 - 111 mmol/L Final   CO2 01/17/2024 23  22 - 32 mmol/L Final   Glucose, Bld 01/17/2024 102 (H)  70 - 99 mg/dL Final   Glucose reference range applies only to samples taken after fasting for at least 8 hours.   BUN 01/17/2024 5 (L)  6 - 20 mg/dL Final   Creatinine, Ser 01/17/2024 0.57 (L)  0.61 - 1.24 mg/dL Final   Calcium 41/32/4401 8.4 (L)  8.9 - 10.3 mg/dL Final   Total Protein 02/72/5366 7.8  6.5 - 8.1 g/dL Final   Albumin 44/01/4741 3.8  3.5 - 5.0 g/dL Final   AST 59/56/3875 129 (H)  15 - 41 U/L Final   ALT 01/17/2024 27  0 - 44 U/L Final   Alkaline Phosphatase 01/17/2024 108  38 -  126 U/L Final   Total Bilirubin 01/17/2024 0.6  0.0 - 1.2 mg/dL Final   GFR, Estimated 01/17/2024 >60  >60 mL/min Final   Comment: (NOTE) Calculated using the CKD-EPI Creatinine Equation (2021)    Anion gap 01/17/2024 14  5 - 15 Final   Performed at [facility], [address] [place], [place] [address]   WBC 01/17/2024 3.6 (L)  4.0 - 10.5 K/uL Final   RBC  01/17/2024 3.54 (L)  4.22 - 5.81 MIL/uL Final   Hemoglobin 01/17/2024 10.9 (L)  13.0 - 17.0 g/dL Final   HCT 91/47/8295 33.8 (L)  39.0 - 52.0 % Final   MCV 01/17/2024 95.5  80.0 - 100.0 fL Final   MCH 01/17/2024 30.8  26.0 - 34.0 pg Final   MCHC 01/17/2024 32.2  30.0 - 36.0 g/dL Final   RDW 62/13/0865 14.8  11.5 - 15.5 % Final   Platelets 01/17/2024 218  150 - 400 K/uL Final   nRBC 01/17/2024 0.0  0.0 - 0.2 % Final   Neutrophils Relative % 01/17/2024 58  % Final   Neutro Abs 01/17/2024 2.1  1.7 - 7.7 K/uL Final   Lymphocytes Relative 01/17/2024 29  % Final   Lymphs Abs 01/17/2024 1.0  0.7 - 4.0 K/uL Final   Monocytes Relative 01/17/2024 10  % Final   Monocytes Absolute 01/17/2024 0.4  0.1 - 1.0 K/uL Final   Eosinophils Relative 01/17/2024 2  % Final   Eosinophils Absolute 01/17/2024 0.1  0.0 - 0.5 K/uL Final   Basophils Relative 01/17/2024 1  % Final   Basophils Absolute 01/17/2024 0.1  0.0 - 0.1 K/uL Final   Immature Granulocytes 01/17/2024 0  % Final   Abs Immature Granulocytes 01/17/2024 0.01  0.00 - 0.07 K/uL Final   Performed at [facility], [address], [place], [place] [address]   Alcohol, Ethyl (B) 01/17/2024 499 (HH)  <10 mg/dL Final   Comment: CRITICAL RESULT CALLED TO, READ BACK BY AND VERIFIED WITH MAYWEATHER, S. RN AT 0132 ON 3.5.25. FA (NOTE) Lowest detectable limit for serum alcohol is 10 mg/dL.  For medical purposes only. Performed at [facility], [address], [place], [place] [address]     Blood Alcohol level:  Lab Results  Component Value Date   ETH <10 01/18/2024   ETH 499 (HH) 01/17/2024    Metabolic Disorder Labs: Lab Results  Component Value Date   HGBA1C 5.0 12/09/2021   MPG 97 12/09/2021   No results found for: "PROLACTIN" Lab Results  Component Value Date   CHOL 179 12/09/2021   TRIG 90 12/09/2021   HDL 44 12/09/2021   CHOLHDL 4.1 12/09/2021   VLDL 18 12/09/2021   LDLCALC 117 (H) 12/09/2021     Therapeutic Lab Levels: No results found for: "LITHIUM" No results found for: "VALPROATE" No results found for: "CBMZ"  Physical Findings   AIMS    Flowsheet Row [facility]  AIMS Total Score 0      AUDIT    Flowsheet Row Admission (Discharged[facility]  Alcohol Use Disorder Identification Test Final Score (AUDIT) 29      CAGE-AID    Flowsheet Row ED to Hosp-Admission (Discharged) from 06/22/2021 in [facility]  CAGE-AID Score 3      PHQ2-9    Flowsheet Row ED from 01/18/2024 in [facility] Office Visit from 06/10/2022 in [facility] - A Dept Of [facility] Office Visit from 12/21/2021 in [facility] - A Dept Of [place] ED from 12/07/2021 in [facility] Emergency Department at [facility] Office Visit from 06/30/2021 in [facility] - A Dept Of [place]  PHQ-2 Total Score 3 0 0 1 0  PHQ-9 Total Score 15 -- 3 6 2       Flowsheet Row ED from 01/18/2024 in [facility] Most recent reading at 01/18/2024  3:05 AM ED from 01/17/2024 in [facility] Most recent reading at 01/17/2024 10:42 PM ED from 01/17/2024 in [facility] Emergency Department at [facility] Most recent reading at 01/17/2024  7:05 PM  C-SSRS RISK CATEGORY No Risk Error: Question 2 not populated No Risk        Musculoskeletal  Strength & Muscle Tone: within normal limits Gait & Station: normal Patient leans: N/A  Psychiatric Specialty Exam  Presentation  General Appearance:  Casual  Eye Contact: Fair  Speech: Clear and Coherent  Speech Volume: Normal  Handedness: Not assessed   Mood and Affect   Mood: Anxious  Affect: Congruent   Thought Process  Thought Processes: Goal Directed; Coherent  Descriptions of Associations:Intact  Orientation:Full (Time, Place and Person)  Thought Content:WDL     Hallucinations:denies  Ideas of Reference:None  Suicidal Thoughts:denies  Homicidal Thoughts:denies   Sensorium  Memory: Immediate Fair  Judgment: Poor  Insight: Lacking   Executive Functions  Concentration: Fair  Attention Span: Fair  Recall: [employer] of Knowledge: Fair  Language: Fair   Assets  Assets: Manufacturing systems engineer; Desire for Improvement   Sleep  Sleep: fair   Physical Exam  Physical Exam Vitals and nursing note reviewed.  Constitutional:      General: He is not in acute distress.    Appearance: He is not ill-appearing.  HENT:     Head: Normocephalic and atraumatic.  Eyes:     Extraocular Movements: Extraocular movements intact.     Conjunctiva/sclera: Conjunctivae normal.  Pulmonary:     Effort: Pulmonary effort is normal. No respiratory distress.  Skin:    General: Skin is warm and dry.  Neurological:     General: No focal deficit present.     Gait: Gait abnormal.    Review of Systems  All other systems reviewed and are negative.  Blood pressure 117/75, pulse 90, temperature 98.4 F (36.9 C), temperature source Oral, resp. rate 17, SpO2 98%. There is no height or weight on file to calculate BMI.  Treatment Plan Summary: Daily contact with patient to assess and evaluate symptoms and progress in treatment and Medication management  Alcohol Use Disorder Last CIWA score 7>10 Vitals reviewed: normotensive with tachycardia 100 at 6AM Last alcohol used 01/16/24 -Switched from ativan to librium taper  -Continue gabapentin 800 mg 3 times daily -CIWA with Ativan/ as needed for CIWA greater than 10 -Thiamine 100 mg IM first day and PO after that -Multivitamin with minerals daily -Tylenol 650 mg every 6 hours as  needed for pain -Zofran 4 mg every 6 hours as needed for nausea or vomiting -Imodium 2 to 4 mg as needed for diarrhea or loose stools  -Maalox/Mylanta 30 mL every 4 hours as needed for indigestion -Milk of Mag 30 mL as needed for constipation  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

## 2024-01-21 NOTE — ED Notes (Addendum)
 Pt in dayroom at this time eating lunch. Pt in no apparent acute distress, calm and cooperative. Will continue to monitor.

## 2024-01-21 NOTE — ED Notes (Addendum)
 Upon assessment, Pt is disoriented, Oriented to self but not place,time and situation. Pt [place] and Dad is the president of [place]. NP Shalon Notified.

## 2024-01-21 NOTE — Group Note (Signed)
 Group Topic: Relaxation  Group Date: 01/21/2024 Start Time: 1625 End Time: 1655 Facilitators: Evelina Bucy, RN  Department: [facility]  Number of Participants: 5  Group Focus: check in, communication, relaxation, and social skills Treatment Modality:  Interpersonal Therapy Interventions utilized were group exercise and story telling Purpose: improve communication skills  Name: Xavier Owens Date of Birth: 1971-03-30  MR: [id number]    Level of Participation: active Quality of Participation: attentive, cooperative, and engaged Interactions with others: gave feedback Mood/Affect: appropriate Triggers (if applicable):  Cognition: coherent/clear Progress: Gaining insight Response:  Plan: follow-up needed  Patients Problems:  Patient Active Problem List   Diagnosis Date Noted   Alcohol use disorder, severe, dependence (HCC) 01/18/2024   Healthcare maintenance 06/10/2022   Nightmares 12/21/2021   Subclinical hypothyroidism 12/21/2021   Substance induced mood disorder (HCC) 12/14/2021   Alcohol use disorder 06/23/2021   Seizures (HCC) 06/22/2021   Elevated liver enzymes 04/19/2016   Essential hypertension 04/19/2016

## 2024-01-21 NOTE — ED Notes (Signed)
 Pt is in his room resting in bed. Pt denies SI/HI/AVH. No acute distress noted. Will continue to monitor for safety.

## 2024-01-21 NOTE — ED Notes (Signed)
 Patient appears to be somewhat confused with unsteady gait.  Patient oriented to person and place.  He reportedly was confused during the night.  Patient with tremors of hands.  Ciwa at this time is 7.  Will monitor for signs of delayed DT's.

## 2024-01-21 NOTE — ED Notes (Signed)
 Patient was given Ativan 2mg  IM per orders for withdrawal symptoms. Patient will be continued to be monitored for safety per protocol and for changes in condition.

## 2024-01-21 NOTE — ED Notes (Signed)
 Patient continues to be disorganized with tremors of hands and reporting double vision.  DR. Enedina Finner and DR Hazle Quant have assessed patient.  Ativan changed to librium and bloodwork ordered, drawn and sent.  No seizure activity at this time.  Patient denies visual hallucinations.  Will monitor closely for decompensation due to etoh withdrawal.

## 2024-01-21 NOTE — Group Note (Signed)
 Group Topic: Recovery Basics  Group Date: 01/21/2024 Start Time: 2000 End Time: 2030 Facilitators: Rae Lips B  Department: [facility]  Number of Participants: 3  Group Focus: abuse issues Treatment Modality:  Individual Therapy and Leisure Development Interventions utilized were patient education and support Purpose: enhance coping skills, express feelings, increase insight, and trigger / craving management  Name: Xavier Owens Date of Birth: [birth year]  MR: [id number]    Level of Participation: patient did not attend Quality of Participation: cooperative Interactions with others: gave feedback Mood/Affect: appropriate Triggers (if applicable): n/a Cognition: coherent/clear Progress: None Response: n/a Plan: patient will be encouraged to go to group  Patients Problems:  Patient Active Problem List   Diagnosis Date Noted   Alcohol use disorder, severe, dependence (HCC) 01/18/2024   Healthcare maintenance 06/10/2022   Nightmares 12/21/2021   Subclinical hypothyroidism 12/21/2021   Substance induced mood disorder (HCC) 12/14/2021   Alcohol use disorder 06/23/2021   Seizures (HCC) 06/22/2021   Elevated liver enzymes 04/19/2016   Essential hypertension 04/19/2016

## 2024-01-22 MED ORDER — CHLORDIAZEPOXIDE HCL 25 MG PO CAPS
25.0000 mg | ORAL_CAPSULE | Freq: Once | ORAL | Status: AC
  Administered 2024-01-22: 25 mg via ORAL

## 2024-01-22 MED ORDER — CHLORDIAZEPOXIDE HCL 25 MG PO CAPS
25.0000 mg | ORAL_CAPSULE | Freq: Once | ORAL | Status: AC
  Administered 2024-01-22: 25 mg via ORAL
  Filled 2024-01-22: qty 1

## 2024-01-22 MED ORDER — GABAPENTIN 300 MG PO CAPS
600.0000 mg | ORAL_CAPSULE | Freq: Three times a day (TID) | ORAL | Status: DC
  Administered 2024-01-22 – 2024-01-23 (×3): 600 mg via ORAL
  Filled 2024-01-22 (×3): qty 2

## 2024-01-22 NOTE — Group Note (Signed)
 Group Topic: Positive Affirmations  Group Date: 01/22/2024 Start Time: 1210 End Time: 1230 Facilitators: Jenean Lindau, RN  Department: [facility]  Number of Participants: 7  Group Focus: affirmation Treatment Modality:  Behavior Modification Therapy Interventions utilized were confrontation, exploration, and patient education Purpose: enhance coping skills, explore maladaptive thinking, express feelings, express irrational fears, improve communication skills, increase insight, regain self-worth, reinforce self-care, and relapse prevention strategies  Name: Xavier Owens Date of Birth: 06-Sep-1971  MR: [id number]    Level of Participation: active Quality of Participation: attentive and cooperative Interactions with others: gave feedback Mood/Affect: appropriate Triggers (if applicable):   Cognition: coherent/clear Progress: Moderate Response:   Plan: follow-up needed  Patients Problems:  Patient Active Problem List   Diagnosis Date Noted   Alcohol use disorder, severe, dependence (HCC) 01/18/2024   Healthcare maintenance 06/10/2022   Nightmares 12/21/2021   Subclinical hypothyroidism 12/21/2021   Substance induced mood disorder (HCC) 12/14/2021   Alcohol use disorder 06/23/2021   Seizures (HCC) 06/22/2021   Elevated liver enzymes 04/19/2016   Essential hypertension 04/19/2016

## 2024-01-22 NOTE — ED Notes (Signed)
 Pt was provided breakfast.

## 2024-01-22 NOTE — Group Note (Signed)
 Group Topic: Recovery Basics  Group Date: 01/22/2024 Start Time: 1000 End Time: 1100 Facilitators: Londell Moh, NT  Department: [facility]  Number of Participants: 7  Group Focus: check in Treatment Modality:  Psychoeducation Interventions utilized were other AA Purpose: enhance coping skills, explore maladaptive thinking, express feelings, and increase insight  Name: Xavier Owens Date of Birth: [birth year]  MR: [id number]    Level of Participation: active Quality of Participation: attentive Interactions with others: gave feedback Mood/Affect: appropriate Triggers (if applicable): n/a Cognition: coherent/clear Progress: Gaining insight Response: AA Meeting Plan: patient will be encouraged to attend future groups.  Patients Problems:  Patient Active Problem List   Diagnosis Date Noted   Alcohol use disorder, severe, dependence (HCC) 01/18/2024   Healthcare maintenance 06/10/2022   Nightmares 12/21/2021   Subclinical hypothyroidism 12/21/2021   Substance induced mood disorder (HCC) 12/14/2021   Alcohol use disorder 06/23/2021   Seizures (HCC) 06/22/2021   Elevated liver enzymes 04/19/2016   Essential hypertension 04/19/2016

## 2024-01-22 NOTE — ED Notes (Signed)
 Pt was provided lunch

## 2024-01-22 NOTE — ED Notes (Signed)
 Pt was provided dinner.

## 2024-01-22 NOTE — ED Notes (Signed)
 Pt is in the bed resting. Respirations are even and unlabored. No acute distress noted. Will continue to monitor for safety

## 2024-01-22 NOTE — ED Notes (Signed)
 Pt came to the nursing station requesting for med to help him sleep.

## 2024-01-22 NOTE — Discharge Planning (Signed)
 Treatment Team discussed patient on today. Patient will likely require further treatment at this time due to delirium. Patient's plan is to discharge home with outpatient services in place. Appointments have been arranged for outpatient therapy for March 06, 2024 at 1:00pm with Madelaine Bhat and February 27, 2024 at 8:00am for medication management with Dr. Alfonse Flavors. Patient will also be provided resources for the local AA meetings within the surrounding area that he can likely follow up with. No concerns were reported by patient on today. LCSW will continue to follow and provide support to patient while on [facility] unit.   Fernande Boyden, LCSW Clinical Social Worker [facility] Ph: [phone]

## 2024-01-22 NOTE — Group Note (Signed)
 Group Topic: Social Support  Group Date: 01/22/2024 Start Time: 2100 End Time: 2130 Facilitators: Rae Lips B  Department: [facility]  Number of Participants: 5  Group Focus: acceptance, activities of daily living skills, check in, communication, community group, daily focus, and family Treatment Modality:  Individual Therapy Interventions utilized were leisure development, story telling, and support Purpose: express feelings  Name: Xavier Owens Date of Birth: 11-10-1971  MR: [id number]    Level of Participation: active Quality of Participation: attentive and cooperative Interactions with others: gave feedback Mood/Affect: appropriate Triggers (if applicable): NA Cognition: coherent/clear Progress: Gaining insight Response: Pt was seen on the phone yelling at his wife MHT told him to not call back and we talked about staying positive.  Plan: patient will be encouraged to keep attending groups.   Patients Problems:  Patient Active Problem List   Diagnosis Date Noted   Alcohol use disorder, severe, dependence (HCC) 01/18/2024   Healthcare maintenance 06/10/2022   Nightmares 12/21/2021   Subclinical hypothyroidism 12/21/2021   Substance induced mood disorder (HCC) 12/14/2021   Alcohol use disorder 06/23/2021   Seizures (HCC) 06/22/2021   Elevated liver enzymes 04/19/2016   Essential hypertension 04/19/2016

## 2024-01-22 NOTE — ED Notes (Signed)
 Patient is sitting in the milieu area interacting with peers and awaiting his discharge. He is wearing his personal clothing. He denies SIHI, AVH, and reports anxiety and depression 6. He reports sleeping okay and denies having night mares. He denies withdrawal s/s. Staff will continue to monitor safety and for changes in condition.

## 2024-01-22 NOTE — ED Provider Notes (Signed)
 Behavioral Health Progress Note  Date and Time: 01/22/2024 9:36 AM Name: Xavier Owens MRN:  [id number]  Reason for admission: Xavier Owens is a 53 year old male with psychiatric history of alcohol abuse, substance-induced mood disorder, and medical history of seizures, elevated liver enzymes and essential hypertension, who presented voluntarily to [facility] requesting alcohol abuse treatment/detox.  UDS is positive for benzodiazepines and cannabis.  Subjective:   Patient evaluated on the unit. Reports sleep is good. Reports appetite is good. States mood is "ok" today. Cravings are not present. On interview, suicidal ideations are not present. Thoughts of self harm are not present. Homicidal ideations are not present.   There are no auditory hallucinations, visual hallucinations, paranoid ideations, or delusional thought processes.   Side effects to currently prescribed medications are none. There are no somatic complaints. Reports regular bowel movements..  Patient reports longstanding hx of tremors, which he reports are bothersome and has self-treated with alcohol to make them less prominent.   Diagnosis:  Final diagnoses:  Alcohol use disorder, severe, dependence (HCC)    Total Time spent with patient: 30 minutes  Substance Use History: He reports history of alcohol abuse for the past 30 years and drinks 1/5th of Vodka daily, last drink yesterday.  He reports a history of withdrawal seizures and DT's.  --was in a residential rehab program named [facility] in [place].   Past Psychiatric History:  He is currently not established with an outpatient psychiatrist or therapist.  No formal past psychiatric history Past Medical History: No significant past medical history.  No Family History: Patient Family Psychiatric  History: Unknown to patient Social History: See above   Pain Medications: See MAR Prescriptions: See MAR Over the Counter: See MAR History of  alcohol / drug use?: Yes Longest period of sobriety (when/how long): Pt reports, 30 days. Negative Consequences of Use: Legal Withdrawal Symptoms: Tremors Name of Substance 1: Alcohol. 1 - Age of First Use: 15. 1 - Amount (size/oz): Pt reports, drinking a fifth of Vodka Tueday (01/16/2024) 1 - Frequency: Pt reports, he drinks daily. 1 - Duration: Ongoing. 1 - Last Use / Amount: Tuesday (01/16/2024). 1 - Method of Aquiring: Purchase 1- Route of Use: Oral.     Current Medications:  Current Facility-Administered Medications  Medication Dose Route Frequency Provider Last Rate Last Admin   acetaminophen (TYLENOL) tablet 650 mg  650 mg Oral Q6H PRN Onuoha, Chinwendu V, NP   650 mg at 01/20/24 0953   alum & mag hydroxide-simeth (MAALOX/MYLANTA) 200-200-20 MG/5ML suspension 30 mL  30 mL Oral Q4H PRN Onuoha, Chinwendu V, NP       haloperidol (HALDOL) tablet 5 mg  5 mg Oral TID PRN Onuoha, Chinwendu V, NP       And   diphenhydrAMINE (BENADRYL) capsule 50 mg  50 mg Oral TID PRN Onuoha, Chinwendu V, NP   50 mg at 01/22/24 0116   haloperidol lactate (HALDOL) injection 5 mg  5 mg Intramuscular TID PRN Onuoha, Chinwendu V, NP       And   diphenhydrAMINE (BENADRYL) injection 50 mg  50 mg Intramuscular TID PRN Onuoha, Chinwendu V, NP       And   LORazepam (ATIVAN) injection 2 mg  2 mg Intramuscular TID PRN Onuoha, Chinwendu V, NP       haloperidol lactate (HALDOL) injection 10 mg  10 mg Intramuscular TID PRN Onuoha, Chinwendu V, NP       And   diphenhydrAMINE (BENADRYL) injection 50  mg  50 mg Intramuscular TID PRN Onuoha, Chinwendu V, NP       And   LORazepam (ATIVAN) injection 2 mg  2 mg Intramuscular TID PRN Onuoha, Chinwendu V, NP       DULoxetine (CYMBALTA) DR capsule 20 mg  20 mg Oral Daily Onuoha, Chinwendu V, NP   20 mg at 01/22/24 0913   gabapentin (NEURONTIN) capsule 800 mg  800 mg Oral TID Lorri Frederick, MD   800 mg at 01/22/24 0913   magnesium hydroxide (MILK OF MAGNESIA)  suspension 30 mL  30 mL Oral Daily PRN Onuoha, Chinwendu V, NP       multivitamin with minerals tablet 1 tablet  1 tablet Oral Daily Onuoha, Chinwendu V, NP   1 tablet at 01/22/24 0913   propranolol (INDERAL) tablet 10 mg  10 mg Oral BID Park Pope, MD   10 mg at 01/22/24 0913   QUEtiapine (SEROQUEL) tablet 100 mg  100 mg Oral QHS Carrion-Carrero, Vernee Baines, MD   100 mg at 01/21/24 2109   thiamine (VITAMIN B1) tablet 100 mg  100 mg Oral Daily Onuoha, Chinwendu V, NP   100 mg at 01/22/24 0913   traZODone (DESYREL) tablet 100 mg  100 mg Oral QHS Carrion-Carrero, Jimesha Rising, MD   100 mg at 01/21/24 2109   Current Outpatient Medications  Medication Sig Dispense Refill   Multiple Vitamin (MULTIVITAMIN) tablet Take 1 tablet by mouth daily.     ondansetron (ZOFRAN) 4 MG tablet Take 1 tablet (4 mg total) by mouth every 6 (six) hours. 12 tablet 0   chlordiazePOXIDE (LIBRIUM) 25 MG capsule 50mg  PO TID x 1D, then 25-50mg  PO BID X 1D, then 25-50mg  PO QD X 1D (Patient not taking: Reported on 01/18/2024) 10 capsule 0   DULoxetine (CYMBALTA) 30 MG capsule Take 30 mg by mouth daily. (Patient not taking: Reported on 01/18/2024)     folic acid (FOLVITE) 1 MG tablet Take 1 mg by mouth daily.     gabapentin (NEURONTIN) 300 MG capsule Take 300 mg by mouth 3 (three) times daily as needed. (Patient not taking: Reported on 01/18/2024)     hydrOXYzine (VISTARIL) 50 MG capsule Take 50 mg by mouth 3 (three) times daily. (Patient not taking: Reported on 01/18/2024)     methocarbamol (ROBAXIN) 500 MG tablet Take 500 mg by mouth 3 (three) times daily as needed. (Patient not taking: Reported on 01/18/2024)     potassium chloride SA (KLOR-CON M) 20 MEQ tablet Take 1 tablet (20 mEq total) by mouth daily for 10 days. (Patient not taking: Reported on 01/18/2024) 10 tablet 0   prazosin (MINIPRESS) 1 MG capsule Take 1 mg by mouth at bedtime. (Patient not taking: Reported on 01/18/2024)     thiamine (VITAMIN B1) 100 MG tablet Take 100 mg by mouth daily.  (Patient not taking: Reported on 01/18/2024)      Labs  Lab Results:  Admission on 01/18/2024  Component Date Value Ref Range Status   POC Amphetamine UR 01/18/2024 None Detected  NONE DETECTED (Cut Off Level 1000 ng/mL) Final   POC Secobarbital (BAR) 01/18/2024 None Detected  NONE DETECTED (Cut Off Level 300 ng/mL) Final   POC Buprenorphine (BUP) 01/18/2024 None Detected  NONE DETECTED (Cut Off Level 10 ng/mL) Final   POC Oxazepam (BZO) 01/18/2024 Positive (A)  NONE DETECTED (Cut Off Level 300 ng/mL) Final   POC Cocaine UR 01/18/2024 None Detected  NONE DETECTED (Cut Off Level 300 ng/mL) Final   POC Methamphetamine UR  01/18/2024 None Detected  NONE DETECTED (Cut Off Level 1000 ng/mL) Final   POC Morphine 01/18/2024 None Detected  NONE DETECTED (Cut Off Level 300 ng/mL) Final   POC Methadone UR 01/18/2024 None Detected  NONE DETECTED (Cut Off Level 300 ng/mL) Final   POC Oxycodone UR 01/18/2024 None Detected  NONE DETECTED (Cut Off Level 100 ng/mL) Final   POC Marijuana UR 01/18/2024 Positive (A)  NONE DETECTED (Cut Off Level 50 ng/mL) Final   Alcohol, Ethyl (B) 01/18/2024 <10  <10 mg/dL Final   Comment: (NOTE) Lowest detectable limit for serum alcohol is 10 mg/dL.  For medical purposes only. Performed at [facility], [address], [place], [place] [address]    Sodium 01/18/2024 138  135 - 145 mmol/L Final   Potassium 01/18/2024 3.7  3.5 - 5.1 mmol/L Final   Chloride 01/18/2024 95 (L)  98 - 111 mmol/L Final   CO2 01/18/2024 26  22 - 32 mmol/L Final   Glucose, Bld 01/18/2024 94  70 - 99 mg/dL Final   Glucose reference range applies only to samples taken after fasting for at least 8 hours.   BUN 01/18/2024 <5 (L)  6 - 20 mg/dL Final   Creatinine, Ser 01/18/2024 0.76  0.61 - 1.24 mg/dL Final   Calcium 19/14/7829 9.2  8.9 - 10.3 mg/dL Final   Total Protein 56/21/3086 7.9  6.5 - 8.1 g/dL Final   Albumin 57/84/6962 3.7  3.5 - 5.0 g/dL Final   AST 95/28/4132 68 (H)  15 - 41 U/L  Final   ALT 01/18/2024 23  0 - 44 U/L Final   Alkaline Phosphatase 01/18/2024 111  38 - 126 U/L Final   Total Bilirubin 01/18/2024 1.4 (H)  0.0 - 1.2 mg/dL Final   GFR, Estimated 01/18/2024 >60  >60 mL/min Final   Comment: (NOTE) Calculated using the CKD-EPI Creatinine Equation (2021)    Anion gap 01/18/2024 17 (H)  5 - 15 Final   Performed at [facility], [address] [place], [place] [address]   Sodium 01/21/2024 134 (L)  135 - 145 mmol/L Final   Potassium 01/21/2024 4.1  3.5 - 5.1 mmol/L Final   Chloride 01/21/2024 99  98 - 111 mmol/L Final   CO2 01/21/2024 23  22 - 32 mmol/L Final   Glucose, Bld 01/21/2024 80  70 - 99 mg/dL Final   Glucose reference range applies only to samples taken after fasting for at least 8 hours.   BUN 01/21/2024 8  6 - 20 mg/dL Final   Creatinine, Ser 01/21/2024 0.69  0.61 - 1.24 mg/dL Final   Calcium 27/25/3664 9.9  8.9 - 10.3 mg/dL Final   Total Protein 40/34/7425 7.8  6.5 - 8.1 g/dL Final   Albumin 95/63/8756 3.8  3.5 - 5.0 g/dL Final   AST 43/32/9518 78 (H)  15 - 41 U/L Final   ALT 01/21/2024 33  0 - 44 U/L Final   Alkaline Phosphatase 01/21/2024 90  38 - 126 U/L Final   Total Bilirubin 01/21/2024 0.7  0.0 - 1.2 mg/dL Final   GFR, Estimated 01/21/2024 >60  >60 mL/min Final   Comment: (NOTE) Calculated using the CKD-EPI Creatinine Equation (2021)    Anion gap 01/21/2024 12  5 - 15 Final   Performed at [facility], [address] [place], [place] [address]   Prothrombin Time 01/21/2024 13.0  11.4 - 15.2 seconds Final   INR 01/21/2024 1.0  0.8 - 1.2 Final  Comment: (NOTE) INR goal varies based on device and disease states. Performed at [facility], [address], [place], [place] [address]    WBC 01/21/2024 5.5  4.0 - 10.5 K/uL Final   RBC 01/21/2024 3.75 (L)  4.22 - 5.81 MIL/uL Final   Hemoglobin 01/21/2024 11.9 (L)  13.0 - 17.0 g/dL Final   HCT 82/95/6213 36.1 (L)  39.0 - 52.0 % Final   MCV 01/21/2024 96.3  80.0 -  100.0 fL Final   MCH 01/21/2024 31.7  26.0 - 34.0 pg Final   MCHC 01/21/2024 33.0  30.0 - 36.0 g/dL Final   RDW 08/65/7846 14.3  11.5 - 15.5 % Final   Platelets 01/21/2024 261  150 - 400 K/uL Final   nRBC 01/21/2024 0.0  0.0 - 0.2 % Final   Performed at [facility], [address] [place], [place] [address]   Magnesium 01/21/2024 1.9  1.7 - 2.4 mg/dL Final   Performed at [facility], [address], [place], [place] [address]  Admission on 01/16/2024, Discharged on 01/17/2024  Component Date Value Ref Range Status   Sodium 01/17/2024 138  135 - 145 mmol/L Final   Potassium 01/17/2024 3.0 (L)  3.5 - 5.1 mmol/L Final   Chloride 01/17/2024 101  98 - 111 mmol/L Final   CO2 01/17/2024 23  22 - 32 mmol/L Final   Glucose, Bld 01/17/2024 102 (H)  70 - 99 mg/dL Final   Glucose reference range applies only to samples taken after fasting for at least 8 hours.   BUN 01/17/2024 5 (L)  6 - 20 mg/dL Final   Creatinine, Ser 01/17/2024 0.57 (L)  0.61 - 1.24 mg/dL Final   Calcium 24/40/1027 8.4 (L)  8.9 - 10.3 mg/dL Final   Total Protein 25/36/6440 7.8  6.5 - 8.1 g/dL Final   Albumin 34/74/2595 3.8  3.5 - 5.0 g/dL Final   AST 63/87/5643 129 (H)  15 - 41 U/L Final   ALT 01/17/2024 27  0 - 44 U/L Final   Alkaline Phosphatase 01/17/2024 108  38 - 126 U/L Final   Total Bilirubin 01/17/2024 0.6  0.0 - 1.2 mg/dL Final   GFR, Estimated 01/17/2024 >60  >60 mL/min Final   Comment: (NOTE) Calculated using the CKD-EPI Creatinine Equation (2021)    Anion gap 01/17/2024 14  5 - 15 Final   Performed at [facility], [address], [place], [place] [address]   WBC 01/17/2024 3.6 (L)  4.0 - 10.5 K/uL Final   RBC 01/17/2024 3.54 (L)  4.22 - 5.81 MIL/uL Final   Hemoglobin 01/17/2024 10.9 (L)  13.0 - 17.0 g/dL Final   HCT 88/41/6606 33.8 (L)  39.0 - 52.0 % Final   MCV 01/17/2024 95.5  80.0 - 100.0 fL Final   MCH 01/17/2024 30.8  26.0 - 34.0 pg Final   MCHC 01/17/2024 32.2  30.0 -  36.0 g/dL Final   RDW 30/16/0109 14.8  11.5 - 15.5 % Final   Platelets 01/17/2024 218  150 - 400 K/uL Final   nRBC 01/17/2024 0.0  0.0 - 0.2 % Final   Neutrophils Relative % 01/17/2024 58  % Final   Neutro Abs 01/17/2024 2.1  1.7 - 7.7 K/uL Final   Lymphocytes Relative 01/17/2024 29  % Final   Lymphs Abs 01/17/2024 1.0  0.7 - 4.0 K/uL Final   Monocytes Relative 01/17/2024 10  % Final   Monocytes Absolute 01/17/2024 0.4  0.1 -  1.0 K/uL Final   Eosinophils Relative 01/17/2024 2  % Final   Eosinophils Absolute 01/17/2024 0.1  0.0 - 0.5 K/uL Final   Basophils Relative 01/17/2024 1  % Final   Basophils Absolute 01/17/2024 0.1  0.0 - 0.1 K/uL Final   Immature Granulocytes 01/17/2024 0  % Final   Abs Immature Granulocytes 01/17/2024 0.01  0.00 - 0.07 K/uL Final   Performed at [facility], [address] [place], [place] [address]   Alcohol, Ethyl (B) 01/17/2024 499 (HH)  <10 mg/dL Final   Comment: CRITICAL RESULT CALLED TO, READ BACK BY AND VERIFIED WITH MAYWEATHER, S. RN AT 0132 ON 3.5.25. FA (NOTE) Lowest detectable limit for serum alcohol is 10 mg/dL.  For medical purposes only. Performed at [facility], [address], [place], [place] [address]     Blood Alcohol level:  Lab Results  Component Value Date   ETH <10 01/18/2024   ETH 499 (HH) 01/17/2024    Metabolic Disorder Labs: Lab Results  Component Value Date   HGBA1C 5.0 12/09/2021   MPG 97 12/09/2021   No results found for: "PROLACTIN" Lab Results  Component Value Date   CHOL 179 12/09/2021   TRIG 90 12/09/2021   HDL 44 12/09/2021   CHOLHDL 4.1 12/09/2021   VLDL 18 12/09/2021   LDLCALC 117 (H) 12/09/2021    Therapeutic Lab Levels: No results found for: "LITHIUM" No results found for: "VALPROATE" No results found for: "CBMZ"  Physical Findings   AIMS    Flowsheet Row [facility]  AIMS Total  Score 0      AUDIT    Flowsheet Row Ad[facility] (Discharged[facility]  Alcohol Use Disorder Identification Test Final Score (AUDIT) 29      CAGE-AID    Flowsheet Row ED to Hosp-Admission (Discharged) from 06/22/2021 in [facility]  CAGE-AID Score 3      PHQ2-9    Flowsheet Row ED from 01/18/2024 in [facility] Office Visit from 06/10/2022 in [facility] - A Dept Of [place] Office Visit from 12/21/2021 in [facility] - A Dept Of [place] [place] ED from 12/07/2021 in [facility] Emergency Department at [facility] Office Visit from 06/30/2021 in [facility] - [facility] Dept Of [place]  PHQ-2 Total Score 3 0 0 1 0  PHQ-9 Total Score 15 -- 3 6 2       Flowsheet Row ED from 01/18/2024 in [facility] Most recent reading at 01/18/2024  3:05 AM ED from 01/17/2024 in [facility] Most recent reading at 01/17/2024 10:42 PM ED from 01/17/2024 in [facility] Emergency Department at [facility] Most recent reading at 01/17/2024  7:05 PM  C-SSRS RISK CATEGORY No Risk Error: Question 2 not populated No Risk        Musculoskeletal  Strength & Muscle Tone: within normal limits Gait & Station: normal Patient leans: N/A  Psychiatric Specialty Exam  Presentation  General Appearance:  Casual  Eye Contact: Fair  Speech: Clear and Coherent  Speech Volume: Normal  Handedness: Not assessed   Mood and Affect  Mood: Anxious  Affect: Congruent   Thought Process  Thought Processes: Goal Directed; Coherent  Descriptions of Associations:Intact  Orientation:Full (  Time, Place and Person)  Thought Content:WDL     Hallucinations:denies  Ideas of Reference:None  Suicidal Thoughts:denies  Homicidal  Thoughts:denies   Sensorium  Memory: Immediate Fair  Judgment: Poor  Insight: Lacking   Executive Functions  Concentration: Fair  Attention Span: Fair  Recall: [employer] of Knowledge: Fair  Language: Fair   Assets  Assets: Manufacturing systems engineer; Desire for Improvement   Sleep  Sleep: fair   Physical Exam  Physical Exam Vitals and nursing note reviewed.  Constitutional:      General: He is not in acute distress.    Appearance: He is not ill-appearing.  HENT:     Head: Normocephalic and atraumatic.  Eyes:     Extraocular Movements: Extraocular movements intact.     Conjunctiva/sclera: Conjunctivae normal.  Pulmonary:     Effort: Pulmonary effort is normal. No respiratory distress.  Skin:    General: Skin is warm and dry.  Neurological:     General: No focal deficit present.     Gait: Gait abnormal.    Review of Systems  All other systems reviewed and are negative.  Blood pressure 133/86, pulse 85, temperature 97.8 F (36.6 C), temperature source Oral, resp. rate 16, SpO2 98%. There is no height or weight on file to calculate BMI.  Treatment Plan Summary: Daily contact with patient to assess and evaluate symptoms and progress in treatment and Medication management  Alcohol Use Disorder Last CIWA score 7>10 Vitals reviewed: normotensive with tachycardia 100 at 6AM Last alcohol used 01/16/24 -Switched from ativan to librium taper (3/10: 25 mg BID, 3/12 25 QD) -Decreased gabapentin 800 mg to 600 mg 3 times daily -CIWA discontinued -Thiamine 100 mg IM first day and PO after that -Multivitamin with minerals daily -Tylenol 650 mg every 6 hours as needed for pain -Zofran 4 mg every 6 hours as needed for nausea or vomiting -Imodium 2 to 4 mg as needed for diarrhea or loose stools  -Maalox/Mylanta 30 mL every 4 hours as needed for indigestion -Milk of Mag 30 mL as needed for constipation  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

## 2024-01-23 ENCOUNTER — Emergency Department (HOSPITAL_COMMUNITY): Payer: MEDICAID

## 2024-01-23 ENCOUNTER — Observation Stay (HOSPITAL_COMMUNITY)
Admission: EM | Admit: 2024-01-23 | Discharge: 2024-01-25 | Disposition: A | Discharge status: Other Acute Inpatient Hospital | Payer: MEDICAID | Attending: Internal Medicine | Admitting: Internal Medicine

## 2024-01-23 ENCOUNTER — Other Ambulatory Visit: Payer: Self-pay

## 2024-01-23 ENCOUNTER — Encounter (HOSPITAL_COMMUNITY): Payer: Self-pay

## 2024-01-23 DIAGNOSIS — R569 Unspecified convulsions: Secondary | ICD-10-CM | POA: Diagnosis not present

## 2024-01-23 DIAGNOSIS — Y908 Blood alcohol level of 240 mg/100 ml or more: Secondary | ICD-10-CM | POA: Insufficient documentation

## 2024-01-23 DIAGNOSIS — F10939 Alcohol use, unspecified with withdrawal, unspecified: Principal | ICD-10-CM | POA: Insufficient documentation

## 2024-01-23 DIAGNOSIS — E876 Hypokalemia: Secondary | ICD-10-CM | POA: Insufficient documentation

## 2024-01-23 DIAGNOSIS — F10930 Alcohol use, unspecified with withdrawal, uncomplicated: Secondary | ICD-10-CM

## 2024-01-23 DIAGNOSIS — M25561 Pain in right knee: Secondary | ICD-10-CM | POA: Insufficient documentation

## 2024-01-23 DIAGNOSIS — I10 Essential (primary) hypertension: Secondary | ICD-10-CM | POA: Insufficient documentation

## 2024-01-23 DIAGNOSIS — G250 Essential tremor: Secondary | ICD-10-CM | POA: Insufficient documentation

## 2024-01-23 DIAGNOSIS — W07XXXA Fall from chair, initial encounter: Secondary | ICD-10-CM | POA: Insufficient documentation

## 2024-01-23 DIAGNOSIS — F1914 Other psychoactive substance abuse with psychoactive substance-induced mood disorder: Secondary | ICD-10-CM | POA: Insufficient documentation

## 2024-01-23 DIAGNOSIS — Z96659 Presence of unspecified artificial knee joint: Secondary | ICD-10-CM | POA: Insufficient documentation

## 2024-01-23 LAB — I-STAT CHEM 8, ED
BUN: 9 mg/dL (ref 6–20)
Calcium, Ion: 1.05 mmol/L — ABNORMAL LOW (ref 1.15–1.40)
Chloride: 101 mmol/L (ref 98–111)
Creatinine, Ser: 0.9 mg/dL (ref 0.61–1.24)
Glucose, Bld: 93 mg/dL (ref 70–99)
HCT: 36 % — ABNORMAL LOW (ref 39.0–52.0)
Hemoglobin: 12.2 g/dL — ABNORMAL LOW (ref 13.0–17.0)
Potassium: 4.3 mmol/L (ref 3.5–5.1)
Sodium: 133 mmol/L — ABNORMAL LOW (ref 135–145)
TCO2: 25 mmol/L (ref 22–32)

## 2024-01-23 LAB — COMPREHENSIVE METABOLIC PANEL
ALT: 57 U/L — ABNORMAL HIGH (ref 0–44)
AST: 125 U/L — ABNORMAL HIGH (ref 15–41)
Albumin: 3.6 g/dL (ref 3.5–5.0)
Alkaline Phosphatase: 85 U/L (ref 38–126)
Anion gap: 8 (ref 5–15)
BUN: 8 mg/dL (ref 6–20)
CO2: 23 mmol/L (ref 22–32)
Calcium: 9.4 mg/dL (ref 8.9–10.3)
Chloride: 102 mmol/L (ref 98–111)
Creatinine, Ser: 0.92 mg/dL (ref 0.61–1.24)
GFR, Estimated: >60 mL/min (ref >60)
Glucose, Bld: 100 mg/dL — ABNORMAL HIGH (ref 70–99)
Potassium: 4.3 mmol/L (ref 3.5–5.1)
Sodium: 133 mmol/L — ABNORMAL LOW (ref 135–145)
Total Bilirubin: 0.7 mg/dL (ref 0.0–1.2)
Total Protein: 7.4 g/dL (ref 6.5–8.1)

## 2024-01-23 LAB — URINALYSIS, ROUTINE W REFLEX MICROSCOPIC
Bilirubin Urine: NEGATIVE
Glucose, UA: NEGATIVE mg/dL
Hgb urine dipstick: NEGATIVE
Ketones, ur: NEGATIVE mg/dL
Leukocytes,Ua: NEGATIVE
Nitrite: NEGATIVE
Protein, ur: NEGATIVE mg/dL
Specific Gravity, Urine: 1.011 (ref 1.005–1.030)
pH: 6 (ref 5.0–8.0)

## 2024-01-23 LAB — RAPID URINE DRUG SCREEN, HOSP PERFORMED
Amphetamines: NOT DETECTED
Barbiturates: NOT DETECTED
Benzodiazepines: POSITIVE — AB
Cocaine: NOT DETECTED
Opiates: NOT DETECTED
Tetrahydrocannabinol: NOT DETECTED

## 2024-01-23 LAB — CBC WITH DIFFERENTIAL/PLATELET
Abs Immature Granulocytes: 0.02 10*3/uL (ref 0.00–0.07)
Basophils Absolute: 0.1 10*3/uL (ref 0.0–0.1)
Basophils Relative: 1 %
Eosinophils Absolute: 0.2 10*3/uL (ref 0.0–0.5)
Eosinophils Relative: 3 %
HCT: 35.7 % — ABNORMAL LOW (ref 39.0–52.0)
Hemoglobin: 11.7 g/dL — ABNORMAL LOW (ref 13.0–17.0)
Immature Granulocytes: 0 %
Lymphocytes Relative: 16 %
Lymphs Abs: 0.8 10*3/uL (ref 0.7–4.0)
MCH: 31.7 pg (ref 26.0–34.0)
MCHC: 32.8 g/dL (ref 30.0–36.0)
MCV: 96.7 fL (ref 80.0–100.0)
Monocytes Absolute: 0.8 10*3/uL (ref 0.1–1.0)
Monocytes Relative: 15 %
Neutro Abs: 3.3 10*3/uL (ref 1.7–7.7)
Neutrophils Relative %: 65 %
Platelets: 291 10*3/uL (ref 150–400)
RBC: 3.69 MIL/uL — ABNORMAL LOW (ref 4.22–5.81)
RDW: 13.8 % (ref 11.5–15.5)
WBC: 5.2 10*3/uL (ref 4.0–10.5)
nRBC: 0 % (ref 0.0–0.2)

## 2024-01-23 LAB — ETHANOL: Alcohol, Ethyl (B): <10 mg/dL (ref <10)

## 2024-01-23 LAB — CBG MONITORING, ED
Glucose-Capillary: 111 mg/dL — ABNORMAL HIGH (ref 70–99)
Glucose-Capillary: 87 mg/dL (ref 70–99)

## 2024-01-23 LAB — MAGNESIUM: Magnesium: 2.1 mg/dL (ref 1.7–2.4)

## 2024-01-23 MED ORDER — GABAPENTIN 300 MG PO CAPS
600.0000 mg | ORAL_CAPSULE | Freq: Two times a day (BID) | ORAL | Status: DC
  Administered 2024-01-23: 600 mg via ORAL
  Filled 2024-01-23: qty 2

## 2024-01-23 MED ORDER — LORAZEPAM 1 MG PO TABS
2.0000 mg | ORAL_TABLET | Freq: Once | ORAL | Status: DC
Start: 2024-01-23 — End: 2024-01-23

## 2024-01-23 MED ORDER — CHLORDIAZEPOXIDE HCL 25 MG PO CAPS: 25.0000 mg | ORAL_CAPSULE | Freq: Four times a day (QID) | ORAL | Status: DC | PRN

## 2024-01-23 MED ORDER — CHLORDIAZEPOXIDE HCL 25 MG PO CAPS
25.0000 mg | ORAL_CAPSULE | Freq: Two times a day (BID) | ORAL | Status: AC
  Administered 2024-01-24 – 2024-01-25 (×2): 25 mg via ORAL
  Filled 2024-01-23 (×2): qty 1

## 2024-01-23 MED ORDER — PROPRANOLOL HCL 10 MG PO TABS
10.0000 mg | ORAL_TABLET | Freq: Two times a day (BID) | ORAL | Status: DC
  Administered 2024-01-23 – 2024-01-25 (×4): 10 mg via ORAL
  Filled 2024-01-23 (×4): qty 1

## 2024-01-23 MED ORDER — LORAZEPAM 2 MG/ML IJ SOLN: 2.0000 mg | Freq: Once | INTRAMUSCULAR | Status: DC

## 2024-01-23 MED ORDER — THIAMINE HCL 100 MG/ML IJ SOLN
100.0000 mg | Freq: Every day | INTRAMUSCULAR | Status: DC
Start: 2024-01-23 — End: 2024-01-25
  Filled 2024-01-23: qty 2

## 2024-01-23 MED ORDER — ACETAMINOPHEN 650 MG RE SUPP: 650.0000 mg | Freq: Four times a day (QID) | RECTAL | Status: DC | PRN

## 2024-01-23 MED ORDER — LOPERAMIDE HCL 2 MG PO CAPS: 2.0000 mg | ORAL_CAPSULE | ORAL | Status: DC | PRN

## 2024-01-23 MED ORDER — ONDANSETRON 4 MG PO TBDP: 4.0000 mg | ORAL_TABLET | Freq: Four times a day (QID) | ORAL | Status: DC | PRN

## 2024-01-23 MED ORDER — ACETAMINOPHEN 325 MG PO TABS: 650.0000 mg | ORAL_TABLET | Freq: Four times a day (QID) | ORAL | Status: DC | PRN

## 2024-01-23 MED ORDER — QUETIAPINE FUMARATE 100 MG PO TABS: 100.0000 mg | ORAL_TABLET | Freq: Every day | ORAL | Status: DC

## 2024-01-23 MED ORDER — GABAPENTIN 300 MG PO CAPS: 600.0000 mg | ORAL_CAPSULE | Freq: Three times a day (TID) | ORAL | Status: DC

## 2024-01-23 MED ORDER — THIAMINE MONONITRATE 100 MG PO TABS
100.0000 mg | ORAL_TABLET | Freq: Every day | ORAL | Status: DC
  Administered 2024-01-23 – 2024-01-25 (×3): 100 mg via ORAL
  Filled 2024-01-23 (×3): qty 1

## 2024-01-23 MED ORDER — LORAZEPAM 1 MG PO TABS: 0.0000 mg | ORAL_TABLET | Freq: Four times a day (QID) | ORAL | Status: DC

## 2024-01-23 MED ORDER — LORAZEPAM 2 MG/ML IJ SOLN
0.0000 mg | Freq: Two times a day (BID) | INTRAMUSCULAR | Status: DC
Start: 2024-01-26 — End: 2024-01-23

## 2024-01-23 MED ORDER — RIVAROXABAN 10 MG PO TABS
10.0000 mg | ORAL_TABLET | Freq: Every day | ORAL | Status: DC
  Administered 2024-01-24 – 2024-01-25 (×2): 10 mg via ORAL
  Filled 2024-01-23 (×2): qty 1

## 2024-01-23 MED ORDER — LORAZEPAM 1 MG PO TABS
0.0000 mg | ORAL_TABLET | Freq: Two times a day (BID) | ORAL | Status: DC
Start: 2024-01-26 — End: 2024-01-23

## 2024-01-23 MED ORDER — QUETIAPINE FUMARATE 100 MG PO TABS: 100.0000 mg | ORAL_TABLET | Freq: Every day | ORAL | 1 refills | Status: DC

## 2024-01-23 MED ORDER — CHLORDIAZEPOXIDE HCL 25 MG PO CAPS: ORAL_CAPSULE | ORAL | 0 refills | Status: DC

## 2024-01-23 MED ORDER — LEVETIRACETAM IN NACL 1500 MG/100ML IV SOLN: 1500.0000 mg | Freq: Once | INTRAVENOUS | Status: DC

## 2024-01-23 MED ORDER — DULOXETINE HCL 20 MG PO CPEP: 20.0000 mg | ORAL_CAPSULE | Freq: Every day | ORAL | Status: DC

## 2024-01-23 MED ORDER — GABAPENTIN 300 MG PO CAPS
600.0000 mg | ORAL_CAPSULE | Freq: Three times a day (TID) | ORAL | Status: DC
  Administered 2024-01-24 – 2024-01-25 (×4): 600 mg via ORAL
  Filled 2024-01-23 (×4): qty 2

## 2024-01-23 MED ORDER — CHLORDIAZEPOXIDE HCL 25 MG PO CAPS
25.0000 mg | ORAL_CAPSULE | Freq: Three times a day (TID) | ORAL | Status: AC
  Administered 2024-01-24 (×2): 25 mg via ORAL
  Filled 2024-01-23 (×2): qty 1

## 2024-01-23 MED ORDER — PROPRANOLOL HCL 10 MG PO TABS: 10.0000 mg | ORAL_TABLET | Freq: Two times a day (BID) | ORAL | Status: DC

## 2024-01-23 MED ORDER — CHLORDIAZEPOXIDE HCL 25 MG PO CAPS: 25.0000 mg | ORAL_CAPSULE | Freq: Every day | ORAL | Status: DC

## 2024-01-23 MED ORDER — ADULT MULTIVITAMIN W/MINERALS CH
1.0000 | ORAL_TABLET | Freq: Every day | ORAL | Status: DC
  Administered 2024-01-24 – 2024-01-25 (×2): 1 via ORAL
  Filled 2024-01-23 (×2): qty 1

## 2024-01-23 MED ORDER — LORAZEPAM 2 MG/ML IJ SOLN
0.0000 mg | Freq: Four times a day (QID) | INTRAMUSCULAR | Status: DC
  Administered 2024-01-23: 1 mg via INTRAVENOUS
  Filled 2024-01-23: qty 1

## 2024-01-23 MED ORDER — DULOXETINE HCL 20 MG PO CPEP
20.0000 mg | ORAL_CAPSULE | Freq: Every day | ORAL | Status: DC
  Administered 2024-01-24 – 2024-01-25 (×2): 20 mg via ORAL
  Filled 2024-01-23 (×2): qty 1

## 2024-01-23 MED ORDER — ADULT MULTIVITAMIN W/MINERALS CH: 1.0000 | ORAL_TABLET | Freq: Every day | ORAL | Status: DC

## 2024-01-23 MED ORDER — TRAZODONE HCL 100 MG PO TABS
100.0000 mg | ORAL_TABLET | Freq: Every day | ORAL | Status: DC
  Administered 2024-01-24 (×2): 100 mg via ORAL
  Filled 2024-01-23: qty 2
  Filled 2024-01-23: qty 1

## 2024-01-23 MED ORDER — CHLORDIAZEPOXIDE HCL 25 MG PO CAPS
25.0000 mg | ORAL_CAPSULE | Freq: Once | ORAL | Status: AC
  Administered 2024-01-23: 25 mg via ORAL
  Filled 2024-01-23: qty 1

## 2024-01-23 MED ORDER — HYDROXYZINE HCL 25 MG PO TABS: 25.0000 mg | ORAL_TABLET | Freq: Four times a day (QID) | ORAL | Status: DC | PRN

## 2024-01-23 NOTE — ED Notes (Signed)
 Patient is resting in bed with eyes closed without any distress. Staff will continue to monitor safety and for changes in condition.

## 2024-01-23 NOTE — ED Provider Notes (Signed)
 Notified by nursing that patient had fallen to the floor from chair with whitnesses seizure like activity.   Upon assessment patient is laying in the floor with seizure-like activity.  He is nonresponsive and diaphoretic.  Staff called 911.  Ordered Ativan 2 mg IM.  However EMS arrived before medication was given.  Patient was admitted to [facility] on 01/18/2024 and placed on librium taper which he finished. He has a prior history of withdrawal induced seizures, with last seizure occurring 3 years ago. Per chart review on admission, he admitted to drinking up to an 1/5 liquor daily and has been doing so for the past 30 years.  Contacted [facility] ED patient will be transferred.  Dr. Jeraldine Loots is the accepting MD

## 2024-01-23 NOTE — ED Notes (Signed)
 Patient transferred to [facility] via EMS

## 2024-01-23 NOTE — Group Note (Signed)
 Group Topic: Wellness  Group Date: 01/23/2024 Start Time: 1330 End Time: 1400 Facilitators: Jerald Kief, RN  Department: [facility]  Number of Participants: 8  Group Focus: coping skills Treatment Modality:  Psychoeducation Interventions utilized were group exercise Purpose: enhance coping skills  Name: Xavier Owens Date of Birth: 1971/06/18  MR: [id number]    Level of Participation: active Quality of Participation: cooperative and quiet Interactions with others: gave feedback Mood/Affect: closed / guarded Triggers (if applicable): n/a Cognition: goal directed Progress: Moderate Response: Pt identifies his coping techniques as fishing when he can, going to the gym, and attending church.  Plan: patient will be encouraged to continue treatment here until a disposition recommendation is decided on.   Patients Problems:  Patient Active Problem List   Diagnosis Date Noted   Alcohol use disorder, severe, dependence (HCC) 01/18/2024   Healthcare maintenance 06/10/2022   Nightmares 12/21/2021   Subclinical hypothyroidism 12/21/2021   Substance induced mood disorder (HCC) 12/14/2021   Alcohol use disorder 06/23/2021   Seizures (HCC) 06/22/2021   Elevated liver enzymes 04/19/2016   Essential hypertension 04/19/2016

## 2024-01-23 NOTE — H&P (Incomplete)
 Date: 01/23/2024               Patient Name:  Xavier Owens MRN: [id number]  DOB: [birth year] Age / Sex: 53 y.o., male   PCP: Lovie Macadamia, MD         Medical Service: Internal Medicine Teaching Service         Attending Physician: Dr. Gust Rung, DO      First Contact: Dr. Annett Fabian, MD Pager [phone]    Second Contact: Dr. Modena Slater, DO Pager [phone]         After Hours (After 5p/  First Contact Pager: [phone]  weekends / holidays): Second Contact Pager: [phone]   SUBJECTIVE   Chief Complaint: alcohol withdrawal seizure  History of Present Illness: Xavier Owens is a 53 yo male with history of alcohol use disorder, substance-induced mood disorder who presents from [facility] with after a seizure. The patient has been at [facility] since 01/18/24 as he was seeking alcohol use disorder treatment. He was started on an ativan taper, then transitioned to a librium taper. Earlier this afternoon he experienced a seizure and was brought to the ED.   The patient is shaking, tremulous. States that his shaking is new for him and that he would not be able to write his name straight because of his withdrawal. Has NOT had any alcohol since he was at [facility] - no visitors.   Last alcohol withdrawal seizure was years ago. Had vomiting once while at [facility] but otherwise denies any recent illnesses, headache, nausea, appetite changes, diarrhea, constipation, or urinary symptoms. No hallucinations (auditory or visual) now, but did have some while at [facility]. Does have some R knee pain and swelling related to his seizure.   Denies tongue biting, urinary incontinence.   Was in [place] for alcohol rehab/detox for 1 month about 2 years ago. Per patient's wife, patient would exhibit withdrawal symptoms within hours of not having a drink.    ED Course: HDS, afeb No leukocytosis CT head unremarkable Xray right knee: small effusion  Meds:  Duloxetine 20 mg daily  Gabapentin 600 mg  TID Propranolol 10 mg BID Seroquel 100 mg at bedtime  Trazodone 100 mg at bedtime  Thiamine   Current Meds  Medication Sig  . chlordiazePOXIDE (LIBRIUM) 25 MG capsule 50mg  PO TID x 1D, then 25-50mg  PO BID X 1D, then 25-50mg  PO QD X 1D  . [START ON 01/24/2024] DULoxetine (CYMBALTA) 20 MG capsule Take 1 capsule (20 mg total) by mouth daily.  Marland Kitchen gabapentin (NEURONTIN) 300 MG capsule Take 2 capsules (600 mg total) by mouth 3 (three) times daily.  Melene Muller ON 01/24/2024] Multiple Vitamin (MULTIVITAMIN WITH MINERALS) TABS tablet Take 1 tablet by mouth daily.  . propranolol (INDERAL) 10 MG tablet Take 1 tablet (10 mg total) by mouth 2 (two) times daily.  . QUEtiapine (SEROQUEL) 100 MG tablet Take 1 tablet (100 mg total) by mouth at bedtime.  . thiamine (VITAMIN B1) 100 MG tablet Take 100 mg by mouth daily.  . traZODone (DESYREL) 100 MG tablet Take 100 mg by mouth at bedtime.    Past Medical History  Past Surgical History:  Procedure Laterality Date  . dislocated hip    . HAND SURGERY    . KNEE SURGERY      Social:  Lives alone normally but has been at [facility] (separated from wife) Occupation: not employed now  Level of Function: independent of ADLs PCP: Lovie Macadamia, MD Substances: drinks 1/5th of Vodka daily  x 30 years (hx of DTs, alcohol withdrawal seizures). Last drink was ~1 week ago. Smokes cigarettes on occasion   Family History: reviewed, no pertinent updates   Allergies: Allergies as of 01/23/2024  . (No Known Allergies)   Review of Systems: A complete ROS was negative except as per HPI.   OBJECTIVE:   Physical Exam: Blood pressure 133/80, pulse 76, temperature 98.1 F (36.7 C), temperature source Oral, resp. rate (!) 30, height 5' 8.5" (1.74 m), weight 77.1 kg, SpO2 99%.   Constitutional: lethargic, slow speaking, tremulous right hand Cardiovascular: regular rate and rhythm, no m/r/g Pulmonary/Chest: normal work of breathing on room air, lungs clear to  auscultation bilaterally Abdominal: soft, non-tender, non-distended MSK: normal bulk and tone, resting tremor RUE  Neurological: alert & oriented to self. Thinks he is at [employer] and that the year is 2024. Knows the month is March but does not know the date.  Skin: warm and dry Psych: appropriate mood and affect  Labs: CBC    Component Value Date/Time   WBC 5.2 01/23/2024 1648   RBC 3.69 (L) 01/23/2024 1648   HGB 12.2 (L) 01/23/2024 1702   HCT 36.0 (L) 01/23/2024 1702   PLT 291 01/23/2024 1648   MCV 96.7 01/23/2024 1648   MCH 31.7 01/23/2024 1648   MCHC 32.8 01/23/2024 1648   RDW 13.8 01/23/2024 1648   LYMPHSABS 0.8 01/23/2024 1648   MONOABS 0.8 01/23/2024 1648   EOSABS 0.2 01/23/2024 1648   BASOSABS 0.1 01/23/2024 1648     CMP     Component Value Date/Time   NA 133 (L) 01/23/2024 1702   NA 140 06/10/2022 1028   K 4.3 01/23/2024 1702   CL 101 01/23/2024 1702   CO2 23 01/23/2024 1648   GLUCOSE 93 01/23/2024 1702   BUN 9 01/23/2024 1702   BUN 7 06/10/2022 1028   CREATININE 0.90 01/23/2024 1702   CALCIUM 9.4 01/23/2024 1648   PROT 7.4 01/23/2024 1648   PROT 7.7 06/10/2022 1028   ALBUMIN 3.6 01/23/2024 1648   ALBUMIN 4.5 06/10/2022 1028   AST 125 (H) 01/23/2024 1648   ALT 57 (H) 01/23/2024 1648   ALKPHOS 85 01/23/2024 1648   BILITOT 0.7 01/23/2024 1648   BILITOT 0.3 06/10/2022 1028   GFRNONAA >60 01/23/2024 1648   GFRAA >60 04/13/2016 0839   ASSESSMENT & PLAN:   Assessment & Plan by Problem: Principal Problem:   Alcohol withdrawal seizure (HCC)  Xavier Owens is a 53 y.o. person living with a history of AUD who presented from [facility] with seizure and admitted for alcohol withdrawal on hospital day 0  Seizure Alcohol use disorder Last drink was around 01/18/24. Admitted to [facility] on 3/6 and started Ativan taper, switched to Librium taper on 3/10. Apparently did not receive 3/10 evening dose or 3/11 morning dose, then had seizure activity 3/11 pm. Atypical  timeline for alcohol withdrawal seizure. Possibly due to inadequate benzodiazepine dosing.  - Received Librium 25mg  in the ED - CIWA with Librium taper, 25mg  TID followed by BID followed by daily - Thiamine supplementation - Dispo to [facility] vs home depending on severity of symptoms  Substance induced mood disorder Continue home psychiatric medication - Seroquel 100mg  daily - Gabapentin 600mg  TID - Trazodone 100mg  qhs  Tremor Patient denies tremor prior to current withdrawal, though tremor is noted from past evaluations and propanolol is on home medication list.  - Continue home propanolol 10mg  BID   Diet: Normal VTE: Xarelto IVF: None Code: Full  Prior to Admission Living Arrangement: Home, living alone Anticipated Discharge Location: [facility]  Dispo: Admit patient to Observation with expected length of stay less than 2 midnights.  Signed: Monna Fam, MD Internal Medicine Resident PGY-  01/23/2024, 11:25 PM

## 2024-01-23 NOTE — ED Notes (Signed)
Pt eating lunch now 

## 2024-01-23 NOTE — ED Notes (Signed)
 Pt asking for his librium because he is feeling shaky. Per MAR, librium is unavailable to give- states "completed". RN messaged MD.

## 2024-01-23 NOTE — Hospital Course (Addendum)
 alcohol Use Disorder Vitals reviewed: normotensive with heart rate of 80. 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

## 2024-01-23 NOTE — ED Notes (Signed)
 Patient was provided lunch

## 2024-01-23 NOTE — ED Notes (Signed)
 Attempted to call [facility] to see how patient will return to them and to give update on discharge but no one answered. Will try again.

## 2024-01-23 NOTE — ED Notes (Addendum)
 Pt was resting in group room. RN still in the process of attempting to get librium PO or ativan PO ordered. Upon staff sitting at nursing station, patients began yelling informing staff that patient is having a seizure. This RN ran into group room and placed pt on his L side. Pulse steady. Vitals machine brought in- o2 remained at 100%. Pt ended up having two seizures, the first one starting at 1436, lasting about 18 seconds. The second began 5 min later, starting at 1441, lasting about 10 seconds. After a couple minutes, pt is able to open eyes. He is not responding verbally. Pupils are dilated. Twitching in L cheek noted. This RN pulled IM Ativan 2mg /8mL; but upon EMS arrival, they informed me to not given Ativan d/t them administering midazolam. Report was given to EMS. At this time, the patient remains stable.

## 2024-01-23 NOTE — Discharge Instructions (Addendum)

## 2024-01-23 NOTE — ED Notes (Signed)
 Assumed pt care from [facility]

## 2024-01-23 NOTE — H&P (Signed)
 Date: 01/23/2024               Patient Name:  Xavier Owens MRN: [id number]  DOB: 31-Mar-1971 Age / Sex: 53 y.o., male   PCP: Lovie Macadamia, MD         Medical Service: Internal Medicine Teaching Service         Attending Physician: Dr. Gust Rung, DO      First Contact: Dr. Annett Fabian, MD Pager [phone]    Second Contact: Dr. Modena Slater, DO Pager [phone]         After Hours (After 5p/  First Contact Pager: [phone]  weekends / holidays): Second Contact Pager: [phone]   SUBJECTIVE   Chief Complaint: alcohol withdrawal seizure  History of Present Illness: Xavier Owens is a 53 yo male with history of alcohol use disorder, substance-induced mood disorder who presents from [facility] with after a seizure. The patient has been at [facility] since 01/18/24 as he was seeking alcohol use disorder treatment. He was started on an ativan taper, then transitioned to a librium taper. Earlier this afternoon he experienced a seizure and was brought to the ED.   The patient is shaking, tremulous. States that his shaking is new for him and that he would not be able to write his name straight because of his withdrawal. Has NOT had any alcohol since he was at [facility] - no visitors.   Last alcohol withdrawal seizure was years ago. Had vomiting once while at [facility] but otherwise denies any recent illnesses, headache, nausea, appetite changes, diarrhea, constipation, or urinary symptoms. No hallucinations (auditory or visual) now, but did have some while at [facility]. Does have some R knee pain and swelling related to his seizure. Denies tongue biting, urinary incontinence.   Was in [place] for alcohol rehab/detox for 1 month about 2 years ago. Per patient's wife, patient would exhibit withdrawal symptoms within hours of not having a drink. He does have some cognitive deficits at baseline, often is unsure of the date and sometimes confabulates.   ED Course: HDS, afeb No leukocytosis CT head  unremarkable Xray right knee: small effusion  Meds:  Duloxetine 20 mg daily  Gabapentin 600 mg TID Propranolol 10 mg BID Seroquel 100 mg at bedtime  Trazodone 100 mg at bedtime  Thiamine   Current Meds  Medication Sig   chlordiazePOXIDE (LIBRIUM) 25 MG capsule 50mg  PO TID x 1D, then 25-50mg  PO BID X 1D, then 25-50mg  PO QD X 1D   [START ON 01/24/2024] DULoxetine (CYMBALTA) 20 MG capsule Take 1 capsule (20 mg total) by mouth daily.   gabapentin (NEURONTIN) 300 MG capsule Take 2 capsules (600 mg total) by mouth 3 (three) times daily.   [START ON 01/24/2024] Multiple Vitamin (MULTIVITAMIN WITH MINERALS) TABS tablet Take 1 tablet by mouth daily.   propranolol (INDERAL) 10 MG tablet Take 1 tablet (10 mg total) by mouth 2 (two) times daily.   QUEtiapine (SEROQUEL) 100 MG tablet Take 1 tablet (100 mg total) by mouth at bedtime.   thiamine (VITAMIN B1) 100 MG tablet Take 100 mg by mouth daily.   traZODone (DESYREL) 100 MG tablet Take 100 mg by mouth at bedtime.    Past Medical History  Past Surgical History:  Procedure Laterality Date   dislocated hip     HAND SURGERY     KNEE SURGERY      Social:  Lives alone normally but has been at [facility] (separated from wife) Occupation: not employed now  Level of  Function: independent of ADLs PCP: Lovie Macadamia, MD Substances: drinks 1/5th of Vodka daily x 30 years (hx of DTs, alcohol withdrawal seizures). Last drink was ~1 week ago. Smokes cigarettes on occasion   Family History: reviewed, no pertinent updates   Allergies: Allergies as of 01/23/2024   (No Known Allergies)   Review of Systems: A complete ROS was negative except as per HPI.   OBJECTIVE:   Physical Exam: Blood pressure 133/80, pulse 76, temperature 98.1 F (36.7 C), temperature source Oral, resp. rate (!) 30, height 5' 8.5" (1.74 m), weight 77.1 kg, SpO2 99%.   Constitutional: slow speech, some confusion Cardiovascular: regular rate and rhythm, no  m/r/g Pulmonary/Chest: normal work of breathing on room air, lungs clear to auscultation bilaterally Abdominal: soft, non-tender, non-distended MSK: normal bulk and tone, resting tremor RUE, RLE Neurological: alert & oriented to self. Thinks he is at [employer] and that the year is 2024. Knows the month is March but does not know the date.  Skin: warm and dry Psych: appropriate mood and affect  Labs: CBC    Component Value Date/Time   WBC 5.2 01/23/2024 1648   RBC 3.69 (L) 01/23/2024 1648   HGB 12.2 (L) 01/23/2024 1702   HCT 36.0 (L) 01/23/2024 1702   PLT 291 01/23/2024 1648   MCV 96.7 01/23/2024 1648   MCH 31.7 01/23/2024 1648   MCHC 32.8 01/23/2024 1648   RDW 13.8 01/23/2024 1648   LYMPHSABS 0.8 01/23/2024 1648   MONOABS 0.8 01/23/2024 1648   EOSABS 0.2 01/23/2024 1648   BASOSABS 0.1 01/23/2024 1648     CMP     Component Value Date/Time   NA 133 (L) 01/23/2024 1702   NA 140 06/10/2022 1028   K 4.3 01/23/2024 1702   CL 101 01/23/2024 1702   CO2 23 01/23/2024 1648   GLUCOSE 93 01/23/2024 1702   BUN 9 01/23/2024 1702   BUN 7 06/10/2022 1028   CREATININE 0.90 01/23/2024 1702   CALCIUM 9.4 01/23/2024 1648   PROT 7.4 01/23/2024 1648   PROT 7.7 06/10/2022 1028   ALBUMIN 3.6 01/23/2024 1648   ALBUMIN 4.5 06/10/2022 1028   AST 125 (H) 01/23/2024 1648   ALT 57 (H) 01/23/2024 1648   ALKPHOS 85 01/23/2024 1648   BILITOT 0.7 01/23/2024 1648   BILITOT 0.3 06/10/2022 1028   GFRNONAA >60 01/23/2024 1648   GFRAA >60 04/13/2016 0839   ASSESSMENT & PLAN:   Assessment & Plan by Problem: Principal Problem:   Alcohol withdrawal seizure (HCC)  Xavier Owens is a 53 y.o. person living with a history of AUD who presented from [facility] with seizure and admitted for alcohol withdrawal on hospital day 0  Seizure Alcohol use disorder Last drink was around 01/18/24. Admitted to [facility] on 3/6 and started Ativan taper, switched to Librium taper on 3/10. Apparently did not receive 3/10  evening dose or 3/11 morning dose, then had seizure activity 3/11 pm. Atypical timeline for alcohol withdrawal seizure. Possibly due to inadequate benzodiazepine dosing. No other obvious causes for seizure.  - Received Librium 25mg  in the ED - CIWA with Librium taper, 25mg  TID followed by BID followed by daily - Thiamine supplementation - Dispo to [facility] vs home depending on severity of symptoms  Substance induced mood disorder Continue home psychiatric medication - Seroquel 100mg  daily - Gabapentin 600mg  TID - Trazodone 100mg  at bedtime - Duloxetine 20mg  daily - Hydroxyzine 25mg  q6h prn  Tremor Patient denies tremor prior to current withdrawal, though tremor is  noted from past evaluations and propanolol is on home medication list.  - Continue home propanolol 10mg  BID   Diet: Normal VTE: Xarelto IVF: None Code: Full  Prior to Admission Living Arrangement: Home, living alone Anticipated Discharge Location: [facility]  Dispo: Admit patient to Observation with expected length of stay less than 2 midnights.  Signed: Monna Fam, MD Internal Medicine Resident PGY-  01/23/2024, 11:25 PM

## 2024-01-23 NOTE — ED Triage Notes (Addendum)
 Pt arrives via EMS from [facility]. Pt had 3 seizures at [facility] that were witnessed. Each one lasted approximately 2-3 minutes. Unsure when his last drink of alcohol was. EMS administered 5mg  midazolam via IM. Pt arrives Awake and Alert, he is currently confused to current date. Pt reports last drink of alcohol was last Tuesday.

## 2024-01-23 NOTE — ED Notes (Signed)
 Went to CT

## 2024-01-23 NOTE — Group Note (Signed)
 Group Topic: Balance in Life  Group Date: 01/23/2024 Start Time: 0300 End Time: 0350 Facilitators: Loleta Dicker, LCSW  Department: [facility]  Number of Participants: 6  Group Focus: activities of daily living skills, check in, clarity of thought, coping skills, daily focus, feeling awareness/expression, healthy friendships, personal responsibility, problem solving, relapse prevention, self-awareness, and self-esteem Treatment Modality:  Behavior Modification Therapy and Cognitive Behavioral Therapy Interventions utilized were assignment, confrontation, exploration, group exercise, mental fitness, story telling, and support Purpose: enhance coping skills, explore maladaptive thinking, express feelings, express irrational fears, improve communication skills, increase insight, regain self-worth, reinforce self-care, relapse prevention strategies, and trigger / craving management  Name: Xavier Owens Date of Birth: [birth year]  MR: [id number]    Level of Participation: Patient not on unit. Transferred to ED for further evaluation.   Patients Problems:  Patient Active Problem List   Diagnosis Date Noted   Alcohol use disorder, severe, dependence (HCC) 01/18/2024   Healthcare maintenance 06/10/2022   Nightmares 12/21/2021   Subclinical hypothyroidism 12/21/2021   Substance induced mood disorder (HCC) 12/14/2021   Alcohol use disorder 06/23/2021   Seizures (HCC) 06/22/2021   Elevated liver enzymes 04/19/2016   Essential hypertension 04/19/2016

## 2024-01-23 NOTE — ED Notes (Signed)
 CCMD called.

## 2024-01-23 NOTE — ED Notes (Signed)
 Pt is currently asleep, resp are even and unlabored. There are no apparent s/sx of distress. No further concerns at this time.

## 2024-01-23 NOTE — ED Provider Notes (Signed)
 [facility] AT [facility] Provider Note   CSN: [id number] Arrival date & time: 01/23/24  1519     History  Chief Complaint  Patient presents with   Seizures    Xavier Owens is a 53 y.o. male with psychiatric history of alcohol abuse, substance-induced mood disorder, and medical history of seizures, elevated liver enzymes and essential hypertension, who presented voluntarily to [facility] requesting alcohol abuse treatment/detox. UDS is positive for benzodiazepines and cannabis.  While at [facility] this AM, patient had a witnessed generalized tonic-clonic seizure, unresponsive and diaphoretic. Fell from chair to floor. Unknown if he hit his head and patient doesn't remember. They called EMS, who gave 5 mg IM midazolam. Had been on librium taper since 01/18/24, which per chart review was completed. Patient states that he did not receive his librium last night or this morning and he has history of alcohol withdrawal seizures. Last seizure was 3 years ago. Drinks up to a 1/5 liquor daily. Doesn't take AEDs. Patient reports pain in his right knee. Has walked since he fell but with difficulty. Denies headache, neck pain, chest or abdominal pain. Denies numbness/tingling. Patient states it is 2024, and that we are in [place] (2025 and in [place]).    Past Medical History:  Diagnosis Date   Alcoholic (HCC)    Hypertension    Seizures (HCC)        Home Medications Prior to Admission medications   Medication Sig Start Date End Date Taking? Authorizing Provider  chlordiazePOXIDE (LIBRIUM) 25 MG capsule 50mg  PO TID x 1D, then 25-50mg  PO BID X 1D, then 25-50mg  PO QD X 1D 01/23/24  Yes Loetta Rough, MD  DULoxetine (CYMBALTA) 20 MG capsule Take 1 capsule (20 mg total) by mouth daily. 01/24/24  Yes Ardis Hughs, NP  gabapentin (NEURONTIN) 300 MG capsule Take 2 capsules (600 mg total) by mouth 3 (three) times daily. 01/23/24  Yes Ardis Hughs, NP   Multiple Vitamin (MULTIVITAMIN WITH MINERALS) TABS tablet Take 1 tablet by mouth daily. 01/24/24  Yes Ardis Hughs, NP  propranolol (INDERAL) 10 MG tablet Take 1 tablet (10 mg total) by mouth 2 (two) times daily. 01/23/24  Yes Ardis Hughs, NP  QUEtiapine (SEROQUEL) 100 MG tablet Take 1 tablet (100 mg total) by mouth at bedtime. 01/23/24  Yes Ardis Hughs, NP  thiamine (VITAMIN B1) 100 MG tablet Take 100 mg by mouth daily. 06/21/22  Yes [provider]  traZODone (DESYREL) 100 MG tablet Take 100 mg by mouth at bedtime.   Yes [provider]      Allergies    Patient has no known allergies.    Review of Systems   Review of Systems A 10 point review of systems was performed and is negative unless otherwise reported in HPI.  Physical Exam Updated Vital Signs BP 133/80   Pulse 76   Temp 98.4 F (36.9 C) (Oral)   Resp (!) 30   Ht 5' 8.5" (1.74 m)   Wt 77.1 kg   SpO2 99%   BMI 25.47 kg/m  Physical Exam General: Normal appearing male, lying in bed.  HEENT: NCAT. PERRLA, EOMI, no nystagmus, Sclera anicteric, MMM, trachea midline. +tongue fasicuclations. Cardiology: RRR, no murmurs/rubs/gallops.  Resp: Normal respiratory rate and effort. CTAB, no wheezes, rhonchi, crackles.  Abd: Soft, non-tender, non-distended. No rebound tenderness or guarding.  GU: Deferred. MSK: Mild R knee effusion without any signs of gross trauma. Mild TTP diffusely to  anterior R knee. Negative anterior/posterior drawer test. Intact passive/active ROM. Soft compartments. NVI. Other extremities wnl.  Skin: warm, dry. . Back: No midline C, T or L spine TTP or step-offs.  Neuro: A&Ox2 (to self and situation but not time and place), CNs II-XII grossly intact. 5/5 strength all extremities. Sensation grossly intact. Mild tremor at rest. Psych: Normal mood and affect.   ED Results / Procedures / Treatments   Labs (all labs ordered are listed, but only abnormal results are  displayed) Labs Reviewed  COMPREHENSIVE METABOLIC PANEL - Abnormal; Notable for the following components:      Result Value   Sodium 133 (*)    Glucose, Bld 100 (*)    AST 125 (*)    ALT 57 (*)    All other components within normal limits  CBC WITH DIFFERENTIAL/PLATELET - Abnormal; Notable for the following components:   RBC 3.69 (*)    Hemoglobin 11.7 (*)    HCT 35.7 (*)    All other components within normal limits  RAPID URINE DRUG SCREEN, HOSP PERFORMED - Abnormal; Notable for the following components:   Benzodiazepines POSITIVE (*)    All other components within normal limits  CBG MONITORING, ED - Abnormal; Notable for the following components:   Glucose-Capillary 111 (*)    All other components within normal limits  I-STAT CHEM 8, ED - Abnormal; Notable for the following components:   Sodium 133 (*)    Calcium, Ion 1.05 (*)    Hemoglobin 12.2 (*)    HCT 36.0 (*)    All other components within normal limits  MAGNESIUM  ETHANOL  URINALYSIS, ROUTINE W REFLEX MICROSCOPIC  HIV ANTIBODY (ROUTINE TESTING W REFLEX)  CBC  COMPREHENSIVE METABOLIC PANEL  CBG MONITORING, ED    EKG EKG Interpretation Date/Time:  Tuesday January 23 2024 15:24:19 EDT Ventricular Rate:  76 PR Interval:  185 QRS Duration:  108 QT Interval:  390 QTC Calculation: 439 R Axis:   -13  Text Interpretation: Sinus rhythm Inferior infarct, old Confirmed by Vivi Barrack [phone]) on 01/23/2024 5:15:31 PM  Radiology DG Knee 2 Views Right Result Date: 01/23/2024 CLINICAL DATA:  Fall with knee pain EXAM: RIGHT KNEE - 1-2 VIEW COMPARISON:  09/20/2017 FINDINGS: No acute fracture or malalignment. Joint spaces are patent. Small knee effusion. Osseous density along the medial femoral condyle not clearly seen on the prior exam. This could be due to remote trauma. There is no associated soft tissue mass nor aggressive radiographic features. IMPRESSION: No acute osseous abnormality. Small knee effusion. Electronically  Signed   By: Jasmine Pang M.D.   On: 01/23/2024 20:22   CT Head Wo Contrast Result Date: 01/23/2024 CLINICAL DATA:  Witnessed seizures. EXAM: CT HEAD WITHOUT CONTRAST TECHNIQUE: Contiguous axial images were obtained from the base of the skull through the vertex without intravenous contrast. RADIATION DOSE REDUCTION: This exam was performed according to the departmental dose-optimization program which includes automated exposure control, adjustment of the mA and/or kV according to patient size and/or use of iterative reconstruction technique. COMPARISON:  CT head without contrast and MR head without contrast 05/20/2022 FINDINGS: Brain: No acute infarct, hemorrhage, or mass lesion is present. No significant white matter lesions are present. Deep brain nuclei are within normal limits. The ventricles are of normal size. The brainstem and cerebellum are within normal limits. Midline structures are within normal limits. Vascular: No hyperdense vessel or unexpected calcification. Skull: Calvarium is intact. No focal lytic or blastic lesions are present. No significant  extracranial soft tissue lesion is present. Sinuses/Orbits: Chronic wall thickening is present in the right maxillary sinus. Mild mucosal thickening is present right maxillary sinus. No fluid level is present. The paranasal sinuses and mastoid air cells are otherwise clear. The globes and orbits are within normal limits. IMPRESSION: 1. Normal CT appearance of the brain. No acute or focal lesion to explain the patient's seizures. 2. Chronic right maxillary sinus disease. Electronically Signed   By: Marin Roberts M.D.   On: 01/23/2024 18:20    Procedures Procedures    Medications Ordered in ED Medications  thiamine (VITAMIN B1) tablet 100 mg (100 mg Oral Given 01/23/24 1730)    Or  thiamine (VITAMIN B1) injection 100 mg ( Intravenous See Alternative 01/23/24 1730)  propranolol (INDERAL) tablet 10 mg (10 mg Oral Given 01/23/24 2142)   rivaroxaban (XARELTO) tablet 10 mg (has no administration in time range)  acetaminophen (TYLENOL) tablet 650 mg (has no administration in time range)    Or  acetaminophen (TYLENOL) suppository 650 mg (has no administration in time range)  multivitamin with minerals tablet 1 tablet (has no administration in time range)  chlordiazePOXIDE (LIBRIUM) capsule 25 mg (has no administration in time range)  hydrOXYzine (ATARAX) tablet 25 mg (has no administration in time range)  loperamide (IMODIUM) capsule 2-4 mg (has no administration in time range)  ondansetron (ZOFRAN-ODT) disintegrating tablet 4 mg (has no administration in time range)  chlordiazePOXIDE (LIBRIUM) capsule 25 mg (has no administration in time range)    Followed by  chlordiazePOXIDE (LIBRIUM) capsule 25 mg (has no administration in time range)    Followed by  chlordiazePOXIDE (LIBRIUM) capsule 25 mg (has no administration in time range)  DULoxetine (CYMBALTA) DR capsule 20 mg (has no administration in time range)  gabapentin (NEURONTIN) capsule 600 mg (has no administration in time range)  QUEtiapine (SEROQUEL) tablet 100 mg (has no administration in time range)  traZODone (DESYREL) tablet 100 mg (has no administration in time range)  chlordiazePOXIDE (LIBRIUM) capsule 25 mg (25 mg Oral Given 01/23/24 2213)  chlordiazePOXIDE (LIBRIUM) capsule 25 mg (25 mg Oral Given 01/23/24 2225)    ED Course/ Medical Decision Making/ A&P                          Medical Decision Making Amount and/or Complexity of Data Reviewed Labs:  Decision-making details documented in ED Course. Radiology: ordered. Decision-making details documented in ED Course.  Risk OTC drugs. Prescription drug management. Decision regarding hospitalization.    This patient presents to the ED for concern of seizure, presumed alcohol withdrawal, this involves an extensive number of treatment options, and is a complaint that carries with it a high risk of  complications and morbidity.  I considered the following differential and admission for this acute, potentially life threatening condition.   MDM:    Patient with h/o complicated alcohol withdrawal presents with presumably alcohol withdrawal seizure. He has reportedly been on librium taper which just finished 1-2 days ago, which he states prompted the seizure. He reports his last drink as 1 week ago, which seems inconsistent with his presentation. He is mildly anxious with tremors at rest and tongue fasciculations. Had witnessed seizure at [facility] which stopped with midazolam with EMS. Initial CIWA here is 7. Concerned as he does present with complicated withdrawal but seems relatively controlled at this time. Will have low threshold to admit. Will observe here and give ativan. Also must consider seizure not associated with  alcohol withdrawal, and he has no significant electrolyte derangements or hypo/hyperglycemia. Seems that he did fall from chair, unknown if head trauma, he doesn't take blood thinner. CTH doesn't show any sequelae of trauma nor any nidus for seizure activity. Given thiamine.   Clinical Course as of 01/23/24 2353  Tue Jan 23, 2024  1824 Rapid urine drug screen (hospital performed)(!) + benzos [HN]  1824 Urinalysis, Routine w reflex microscopic -Urine, Clean Catch neg [HN]  1824 Alcohol, Ethyl (B): <10 neg [HN]  1824 CT Head Wo Contrast 1. Normal CT appearance of the brain. No acute or focal lesion to explain the patient's seizures. 2. Chronic right maxillary sinus disease.   [HN]  1824 CBC with Differential/Platelet(!) Unremarkable in the context of this patient's presentation  [HN]  1825 Glucose-Capillary: 87 [HN]  2134 CIWA 0. Requesting to eat and his home medications. No seizure like activity observed here in ED.  [HN]  2135 CIWA has been <10 for >6 hours here in the ED. At his mental status baseline. He has been vitally stable as well throughout his time here in ED. Gave  him dose of librium to assess response but patient still with tremors. Given extensive history of complicated withdrawal and withdrawal seizure today will admit for obs overnight. [HN]    Clinical Course User Index [HN] Loetta Rough, MD    Labs: I Ordered, and personally interpreted labs.  The pertinent results include:  those listed above  Imaging Studies ordered: I ordered imaging studies including CTH I independently visualized and interpreted imaging. I agree with the radiologist interpretation  Additional history obtained from chart review.   Cardiac Monitoring: The patient was maintained on a cardiac monitor.  I personally viewed and interpreted the cardiac monitored which showed an underlying rhythm of: NSR  Reevaluation: After the interventions noted above, I reevaluated the patient and found that they have :improved  Social Determinants of Health: Lives independently  Disposition:  Admit to medicine  Co morbidities that complicate the patient evaluation  Past Medical History:  Diagnosis Date   Alcoholic (HCC)    Hypertension    Seizures (HCC)      Medicines Meds ordered this encounter  Medications   DISCONTD: levETIRAcetam (KEPPRA) IVPB 1500 mg/ 100 mL premix   DISCONTD: LORazepam (ATIVAN) injection 0-4 mg    CIWA-AR < 5 =:   0 mg    CIWA-AR 5 -10 =:   1 mg    CIWA-AR 11 -15 =:   2 mg    CIWA-AR 16 -20 =:   3 mg    CIWA-AR 16 -20 =:   Recheck CIWA-AR in 1 hour; if > 20 notify MD    CIWA-AR > 20 =:   4 mg    CIWA-AR > 20 =:   Notify MD   DISCONTD: LORazepam (ATIVAN) tablet 0-4 mg    CIWA-AR < 5 =:   0 mg    CIWA-AR 5 -10 =:   1 mg    CIWA-AR 11 -15 =:   2 mg    CIWA-AR 16 -20 =:   3 mg    CIWA-AR 16 -20 =:   Recheck CIWA-AR in 1 hour; if > 20 notify MD    CIWA-AR > 20 =:   4 mg    CIWA-AR > 20 =:   Notify MD   DISCONTD: LORazepam (ATIVAN) injection 0-4 mg    CIWA-AR < 5 =:   0 mg    CIWA-AR 5 -10 =:  1 mg    CIWA-AR 11 -15 =:   2 mg    CIWA-AR  16 -20 =:   3 mg    CIWA-AR 16 -20 =:   Recheck CIWA-AR in 1 hour; if > 20 notify MD    CIWA-AR > 20 =:   4 mg    CIWA-AR > 20 =:   Notify MD   DISCONTD: LORazepam (ATIVAN) tablet 0-4 mg    CIWA-AR < 5 =:   0 mg    CIWA-AR 5 -10 =:   1 mg    CIWA-AR 11 -15 =:   2 mg    CIWA-AR 16 -20 =:   3 mg    CIWA-AR 16 -20 =:   Recheck CIWA-AR in 1 hour; if > 20 notify MD    CIWA-AR > 20 =:   4 mg    CIWA-AR > 20 =:   Notify MD   OR Linked Order Group    thiamine (VITAMIN B1) tablet 100 mg    thiamine (VITAMIN B1) injection 100 mg   DISCONTD: gabapentin (NEURONTIN) capsule 600 mg   propranolol (INDERAL) tablet 10 mg   chlordiazePOXIDE (LIBRIUM) capsule 25 mg   chlordiazePOXIDE (LIBRIUM) 25 MG capsule    Sig: 50mg  PO TID x 1D, then 25-50mg  PO BID X 1D, then 25-50mg  PO QD X 1D    Dispense:  10 capsule    Refill:  0   chlordiazePOXIDE (LIBRIUM) capsule 25 mg   rivaroxaban (XARELTO) tablet 10 mg   OR Linked Order Group    acetaminophen (TYLENOL) tablet 650 mg    acetaminophen (TYLENOL) suppository 650 mg   multivitamin with minerals tablet 1 tablet   chlordiazePOXIDE (LIBRIUM) capsule 25 mg   hydrOXYzine (ATARAX) tablet 25 mg   loperamide (IMODIUM) capsule 2-4 mg   ondansetron (ZOFRAN-ODT) disintegrating tablet 4 mg   FOLLOWED BY Linked Order Group    chlordiazePOXIDE (LIBRIUM) capsule 25 mg    chlordiazePOXIDE (LIBRIUM) capsule 25 mg    chlordiazePOXIDE (LIBRIUM) capsule 25 mg   DULoxetine (CYMBALTA) DR capsule 20 mg   gabapentin (NEURONTIN) capsule 600 mg   QUEtiapine (SEROQUEL) tablet 100 mg   traZODone (DESYREL) tablet 100 mg    I have reviewed the patients home medicines and have made adjustments as needed  Problem List / ED Course: Problem List Items Addressed This Visit   None Visit Diagnoses       Alcohol withdrawal syndrome with complication (HCC)    -  Primary     Seizure (HCC)       Relevant Medications   gabapentin (NEURONTIN) capsule 600 mg (Start on 01/24/2024 10:00  AM)                   This note was created using dictation software, which may contain spelling or grammatical errors.    Loetta Rough, MD 01/23/24 [phone]

## 2024-01-23 NOTE — ED Provider Notes (Signed)
 Behavioral Health Progress Note  Date and Time: 01/23/2024 10:13 AM Name: Xavier Owens MRN:  [id number]  Reason for admission: Xavier Owens is a 53 year old male with psychiatric history of alcohol abuse, substance-induced mood disorder, and medical history of seizures, elevated liver enzymes and essential hypertension, who presented voluntarily to [facility] requesting alcohol abuse treatment/detox.  UDS is positive for benzodiazepines and cannabis.  Subjective:   Staff reports the patient is less confused.  He is sleeping better and does not seem to have any signs and symptoms of acute distress.  He has been attending groups.  His confusion is improved.  He discussed discharge plans with the LCSW.  Yesterday he remained somewhat confused periodically but overall is much improved since yesterday. When seen today patient is alert oriented cooperative and reports improved sleep appetite and mood.  He denies any confusion.  He denies any cravings.  He denies any active SI/HI/AVH.  Denies side effects.  He claims that he does have a long-term history of tremors and claims that alcohol sometimes improves those tremors.  His confusion is improving. He does have outpatient therapy scheduled and also medication management.  Today is contracting for safety.  Diagnosis:  Final diagnoses:  Alcohol use disorder, severe, dependence (HCC)    Total Time spent with patient: 30 minutes  Substance Use History: He reports history of alcohol abuse for the past 30 years and drinks 1/5th of Vodka daily, last drink yesterday.  He reports a history of withdrawal seizures and DT's.  --was in a residential rehab program named [facility] in [place].   Past Psychiatric History:  He is currently not established with an outpatient psychiatrist or therapist.  No formal past psychiatric history Past Medical History: No significant past medical history.  No Family History: Patient Family  Psychiatric  History: Unknown to patient Social History: See above   Pain Medications: See MAR Prescriptions: See MAR Over the Counter: See MAR History of alcohol / drug use?: Yes Longest period of sobriety (when/how long): Pt reports, 30 days. Negative Consequences of Use: Legal Withdrawal Symptoms: Tremors Name of Substance 1: Alcohol. 1 - Age of First Use: 15. 1 - Amount (size/oz): Pt reports, drinking a fifth of Vodka Tueday (01/16/2024) 1 - Frequency: Pt reports, he drinks daily. 1 - Duration: Ongoing. 1 - Last Use / Amount: Tuesday (01/16/2024). 1 - Method of Aquiring: Purchase 1- Route of Use: Oral.     Current Medications:  Current Facility-Administered Medications  Medication Dose Route Frequency Provider Last Rate Last Admin   acetaminophen (TYLENOL) tablet 650 mg  650 mg Oral Q6H PRN Onuoha, Chinwendu V, NP   650 mg at 01/20/24 0953   alum & mag hydroxide-simeth (MAALOX/MYLANTA) 200-200-20 MG/5ML suspension 30 mL  30 mL Oral Q4H PRN Onuoha, Chinwendu V, NP       haloperidol (HALDOL) tablet 5 mg  5 mg Oral TID PRN Onuoha, Chinwendu V, NP       And   diphenhydrAMINE (BENADRYL) capsule 50 mg  50 mg Oral TID PRN Onuoha, Chinwendu V, NP   50 mg at 01/22/24 0116   haloperidol lactate (HALDOL) injection 5 mg  5 mg Intramuscular TID PRN Onuoha, Chinwendu V, NP       And   diphenhydrAMINE (BENADRYL) injection 50 mg  50 mg Intramuscular TID PRN Onuoha, Chinwendu V, NP       And   LORazepam (ATIVAN) injection 2 mg  2 mg Intramuscular TID PRN Onuoha, Chinwendu V,  NP       haloperidol lactate (HALDOL) injection 10 mg  10 mg Intramuscular TID PRN Onuoha, Chinwendu V, NP       And   diphenhydrAMINE (BENADRYL) injection 50 mg  50 mg Intramuscular TID PRN Onuoha, Chinwendu V, NP       And   LORazepam (ATIVAN) injection 2 mg  2 mg Intramuscular TID PRN Onuoha, Chinwendu V, NP       DULoxetine (CYMBALTA) DR capsule 20 mg  20 mg Oral Daily Onuoha, Chinwendu V, NP   20 mg at 01/22/24  0913   gabapentin (NEURONTIN) capsule 600 mg  600 mg Oral TID Lorri Frederick, MD   600 mg at 01/22/24 2121   magnesium hydroxide (MILK OF MAGNESIA) suspension 30 mL  30 mL Oral Daily PRN Onuoha, Chinwendu V, NP       multivitamin with minerals tablet 1 tablet  1 tablet Oral Daily Onuoha, Chinwendu V, NP   1 tablet at 01/22/24 0913   propranolol (INDERAL) tablet 10 mg  10 mg Oral BID Park Pope, MD   10 mg at 01/22/24 2122   QUEtiapine (SEROQUEL) tablet 100 mg  100 mg Oral QHS Carrion-Carrero, Margely, MD   100 mg at 01/22/24 2121   thiamine (VITAMIN B1) tablet 100 mg  100 mg Oral Daily Onuoha, Chinwendu V, NP   100 mg at 01/22/24 0913   Current Outpatient Medications  Medication Sig Dispense Refill   Multiple Vitamin (MULTIVITAMIN) tablet Take 1 tablet by mouth daily.     ondansetron (ZOFRAN) 4 MG tablet Take 1 tablet (4 mg total) by mouth every 6 (six) hours. 12 tablet 0   chlordiazePOXIDE (LIBRIUM) 25 MG capsule 50mg  PO TID x 1D, then 25-50mg  PO BID X 1D, then 25-50mg  PO QD X 1D (Patient not taking: Reported on 01/18/2024) 10 capsule 0   DULoxetine (CYMBALTA) 30 MG capsule Take 30 mg by mouth daily. (Patient not taking: Reported on 01/18/2024)     folic acid (FOLVITE) 1 MG tablet Take 1 mg by mouth daily.     gabapentin (NEURONTIN) 300 MG capsule Take 300 mg by mouth 3 (three) times daily as needed. (Patient not taking: Reported on 01/18/2024)     hydrOXYzine (VISTARIL) 50 MG capsule Take 50 mg by mouth 3 (three) times daily. (Patient not taking: Reported on 01/18/2024)     methocarbamol (ROBAXIN) 500 MG tablet Take 500 mg by mouth 3 (three) times daily as needed. (Patient not taking: Reported on 01/18/2024)     potassium chloride SA (KLOR-CON M) 20 MEQ tablet Take 1 tablet (20 mEq total) by mouth daily for 10 days. (Patient not taking: Reported on 01/18/2024) 10 tablet 0   prazosin (MINIPRESS) 1 MG capsule Take 1 mg by mouth at bedtime. (Patient not taking: Reported on 01/18/2024)     thiamine  (VITAMIN B1) 100 MG tablet Take 100 mg by mouth daily. (Patient not taking: Reported on 01/18/2024)      Labs  Lab Results:  Admission on 01/18/2024  Component Date Value Ref Range Status   POC Amphetamine UR 01/18/2024 None Detected  NONE DETECTED (Cut Off Level 1000 ng/mL) Final   POC Secobarbital (BAR) 01/18/2024 None Detected  NONE DETECTED (Cut Off Level 300 ng/mL) Final   POC Buprenorphine (BUP) 01/18/2024 None Detected  NONE DETECTED (Cut Off Level 10 ng/mL) Final   POC Oxazepam (BZO) 01/18/2024 Positive (A)  NONE DETECTED (Cut Off Level 300 ng/mL) Final   POC Cocaine UR 01/18/2024 None Detected  NONE DETECTED (Cut Off Level 300 ng/mL) Final   POC Methamphetamine UR 01/18/2024 None Detected  NONE DETECTED (Cut Off Level 1000 ng/mL) Final   POC Morphine 01/18/2024 None Detected  NONE DETECTED (Cut Off Level 300 ng/mL) Final   POC Methadone UR 01/18/2024 None Detected  NONE DETECTED (Cut Off Level 300 ng/mL) Final   POC Oxycodone UR 01/18/2024 None Detected  NONE DETECTED (Cut Off Level 100 ng/mL) Final   POC Marijuana UR 01/18/2024 Positive (A)  NONE DETECTED (Cut Off Level 50 ng/mL) Final   Alcohol, Ethyl (B) 01/18/2024 <10  <10 mg/dL Final   Comment: (NOTE) Lowest detectable limit for serum alcohol is 10 mg/dL.  For medical purposes only. Performed at [facility], [address], [place], [place] [address]    Sodium 01/18/2024 138  135 - 145 mmol/L Final   Potassium 01/18/2024 3.7  3.5 - 5.1 mmol/L Final   Chloride 01/18/2024 95 (L)  98 - 111 mmol/L Final   CO2 01/18/2024 26  22 - 32 mmol/L Final   Glucose, Bld 01/18/2024 94  70 - 99 mg/dL Final   Glucose reference range applies only to samples taken after fasting for at least 8 hours.   BUN 01/18/2024 <5 (L)  6 - 20 mg/dL Final   Creatinine, Ser 01/18/2024 0.76  0.61 - 1.24 mg/dL Final   Calcium 60/45/4098 9.2  8.9 - 10.3 mg/dL Final   Total Protein 11/91/4782 7.9  6.5 - 8.1 g/dL Final   Albumin 95/62/1308 3.7  3.5 -  5.0 g/dL Final   AST 65/78/4696 68 (H)  15 - 41 U/L Final   ALT 01/18/2024 23  0 - 44 U/L Final   Alkaline Phosphatase 01/18/2024 111  38 - 126 U/L Final   Total Bilirubin 01/18/2024 1.4 (H)  0.0 - 1.2 mg/dL Final   GFR, Estimated 01/18/2024 >60  >60 mL/min Final   Comment: (NOTE) Calculated using the CKD-EPI Creatinine Equation (2021)    Anion gap 01/18/2024 17 (H)  5 - 15 Final   Performed at [facility], [address] [place], [place] [address]   Sodium 01/21/2024 134 (L)  135 - 145 mmol/L Final   Potassium 01/21/2024 4.1  3.5 - 5.1 mmol/L Final   Chloride 01/21/2024 99  98 - 111 mmol/L Final   CO2 01/21/2024 23  22 - 32 mmol/L Final   Glucose, Bld 01/21/2024 80  70 - 99 mg/dL Final   Glucose reference range applies only to samples taken after fasting for at least 8 hours.   BUN 01/21/2024 8  6 - 20 mg/dL Final   Creatinine, Ser 01/21/2024 0.69  0.61 - 1.24 mg/dL Final   Calcium 41/32/4401 9.9  8.9 - 10.3 mg/dL Final   Total Protein 02/72/5366 7.8  6.5 - 8.1 g/dL Final   Albumin 44/01/4741 3.8  3.5 - 5.0 g/dL Final   AST 59/56/3875 78 (H)  15 - 41 U/L Final   ALT 01/21/2024 33  0 - 44 U/L Final   Alkaline Phosphatase 01/21/2024 90  38 - 126 U/L Final   Total Bilirubin 01/21/2024 0.7  0.0 - 1.2 mg/dL Final   GFR, Estimated 01/21/2024 >60  >60 mL/min Final   Comment: (NOTE) Calculated using the CKD-EPI Creatinine Equation (2021)    Anion gap 01/21/2024 12  5 - 15 Final   Performed at [facility], [address], [place], [place] [address]   Prothrombin Time 01/21/2024 13.0  11.4 -  15.2 seconds Final   INR 01/21/2024 1.0  0.8 - 1.2 Final   Comment: (NOTE) INR goal varies based on device and disease states. Performed at [facility], [address], [place], [place] [address]    WBC 01/21/2024 5.5  4.0 - 10.5 K/uL Final   RBC 01/21/2024 3.75 (L)  4.22 - 5.81 MIL/uL Final   Hemoglobin 01/21/2024 11.9 (L)  13.0 - 17.0 g/dL Final   HCT 60/45/4098 36.1 (L)   39.0 - 52.0 % Final   MCV 01/21/2024 96.3  80.0 - 100.0 fL Final   MCH 01/21/2024 31.7  26.0 - 34.0 pg Final   MCHC 01/21/2024 33.0  30.0 - 36.0 g/dL Final   RDW 11/91/4782 14.3  11.5 - 15.5 % Final   Platelets 01/21/2024 261  150 - 400 K/uL Final   nRBC 01/21/2024 0.0  0.0 - 0.2 % Final   Performed at [facility], [address], [place], [place] [address]   Magnesium 01/21/2024 1.9  1.7 - 2.4 mg/dL Final   Performed at [facility], [address], [place], [place] [address]  Admission on 01/16/2024, Discharged on 01/17/2024  Component Date Value Ref Range Status   Sodium 01/17/2024 138  135 - 145 mmol/L Final   Potassium 01/17/2024 3.0 (L)  3.5 - 5.1 mmol/L Final   Chloride 01/17/2024 101  98 - 111 mmol/L Final   CO2 01/17/2024 23  22 - 32 mmol/L Final   Glucose, Bld 01/17/2024 102 (H)  70 - 99 mg/dL Final   Glucose reference range applies only to samples taken after fasting for at least 8 hours.   BUN 01/17/2024 5 (L)  6 - 20 mg/dL Final   Creatinine, Ser 01/17/2024 0.57 (L)  0.61 - 1.24 mg/dL Final   Calcium 78/46/9629 8.4 (L)  8.9 - 10.3 mg/dL Final   Total Protein 52/84/1324 7.8  6.5 - 8.1 g/dL Final   Albumin 40/08/2724 3.8  3.5 - 5.0 g/dL Final   AST 36/64/4034 129 (H)  15 - 41 U/L Final   ALT 01/17/2024 27  0 - 44 U/L Final   Alkaline Phosphatase 01/17/2024 108  38 - 126 U/L Final   Total Bilirubin 01/17/2024 0.6  0.0 - 1.2 mg/dL Final   GFR, Estimated 01/17/2024 >60  >60 mL/min Final   Comment: (NOTE) Calculated using the CKD-EPI Creatinine Equation (2021)    Anion gap 01/17/2024 14  5 - 15 Final   Performed at [facility], [address] [place], [place] [address]   WBC 01/17/2024 3.6 (L)  4.0 - 10.5 K/uL Final   RBC 01/17/2024 3.54 (L)  4.22 - 5.81 MIL/uL Final   Hemoglobin 01/17/2024 10.9 (L)  13.0 - 17.0 g/dL Final   HCT 56/38/7564 33.8 (L)  39.0 - 52.0 % Final   MCV 01/17/2024 95.5  80.0 - 100.0 fL Final   MCH 01/17/2024 30.8   26.0 - 34.0 pg Final   MCHC 01/17/2024 32.2  30.0 - 36.0 g/dL Final   RDW 33/29/5188 14.8  11.5 - 15.5 % Final   Platelets 01/17/2024 218  150 - 400 K/uL Final   nRBC 01/17/2024 0.0  0.0 - 0.2 % Final   Neutrophils Relative % 01/17/2024 58  % Final   Neutro Abs 01/17/2024 2.1  1.7 - 7.7 K/uL Final   Lymphocytes Relative 01/17/2024 29  % Final   Lymphs Abs 01/17/2024 1.0  0.7 - 4.0 K/uL Final   Monocytes  Relative 01/17/2024 10  % Final   Monocytes Absolute 01/17/2024 0.4  0.1 - 1.0 K/uL Final   Eosinophils Relative 01/17/2024 2  % Final   Eosinophils Absolute 01/17/2024 0.1  0.0 - 0.5 K/uL Final   Basophils Relative 01/17/2024 1  % Final   Basophils Absolute 01/17/2024 0.1  0.0 - 0.1 K/uL Final   Immature Granulocytes 01/17/2024 0  % Final   Abs Immature Granulocytes 01/17/2024 0.01  0.00 - 0.07 K/uL Final   Performed at [facility], [address], [place], [place] [address]   Alcohol, Ethyl (B) 01/17/2024 499 (HH)  <10 mg/dL Final   Comment: CRITICAL RESULT CALLED TO, READ BACK BY AND VERIFIED WITH MAYWEATHER, S. RN AT 0132 ON 3.5.25. FA (NOTE) Lowest detectable limit for serum alcohol is 10 mg/dL.  For medical purposes only. Performed at [facility], [address], [place], [place] [address]     Blood Alcohol level:  Lab Results  Component Value Date   ETH <10 01/18/2024   ETH 499 (HH) 01/17/2024    Metabolic Disorder Labs: Lab Results  Component Value Date   HGBA1C 5.0 12/09/2021   MPG 97 12/09/2021   No results found for: "PROLACTIN" Lab Results  Component Value Date   CHOL 179 12/09/2021   TRIG 90 12/09/2021   HDL 44 12/09/2021   CHOLHDL 4.1 12/09/2021   VLDL 18 12/09/2021   LDLCALC 117 (H) 12/09/2021    Therapeutic Lab Levels: No results found for: "LITHIUM" No results found for: "VALPROATE" No results found for: "CBMZ"  Physical Findings   AIMS    Flowsheet Row [facility]  AIMS Total Score 0      AUDIT    Flowsheet Row Ad[facility] (Discharged[facility]  Alcohol Use Disorder Identification Test Final Score (AUDIT) 29      CAGE-AID    Flowsheet Row ED to Hosp-Admission (Discharged) from 06/22/2021 in [facility]  CAGE-AID Score 3      PHQ2-9    Flowsheet Row ED from 01/18/2024 in [facility] Office Visit from 06/10/2022 in [facility] - A Dept Of [place] Office Visit from 12/21/2021 in [facility] - A Dept Of [place] ED from 12/07/2021 in [facility] Emergency Department at [facility] Office Visit from 06/30/2021 in [facility]tr - A Dept Of [place]  PHQ-2 Total Score 0 0 0 1 0  PHQ-9 Total Score 3 -- 3 6 2       Flowsheet Row ED from 01/18/2024 in [facility] Most recent reading at 01/18/2024  3:05 AM ED from 01/17/2024 in [facility] Most recent reading at 01/17/2024 10:42 PM ED from 01/17/2024 in [facility] Emergency Department at [facility] Most recent reading at 01/17/2024  7:05 PM  C-SSRS RISK CATEGORY No Risk Error: Question 2 not populated No Risk        Musculoskeletal  Strength & Muscle Tone: within normal limits Gait & Station: normal Patient leans: N/A  Psychiatric Specialty Exam  Presentation  General Appearance:  Casual  Eye Contact: Fair  Speech: Clear and Coherent  Speech Volume: Normal  Handedness: Not assessed   Mood and Affect  Mood: Anxious  Affect: Congruent  Thought Process  Thought Processes: Goal Directed; Coherent  Descriptions of Associations:Intact  Orientation:Full (Time, Place and Person)  Thought Content:WDL     Hallucinations:denies  Ideas of  Reference:None  Suicidal Thoughts:denies  Homicidal Thoughts:denies   Sensorium  Memory: Immediate Fair  Judgment: Poor  Insight: Lacking   Executive Functions  Concentration: Fair  Attention Span: Fair  Recall: [employer] of Knowledge: Fair  Language: Fair   Assets  Assets: Manufacturing systems engineer; Desire for Improvement   Sleep  Sleep: fair   Physical Exam  Physical Exam Vitals and nursing note reviewed.  Constitutional:      General: He is not in acute distress.    Appearance: He is not ill-appearing.  HENT:     Head: Normocephalic and atraumatic.  Eyes:     Extraocular Movements: Extraocular movements intact.     Conjunctiva/sclera: Conjunctivae normal.  Pulmonary:     Effort: Pulmonary effort is normal. No respiratory distress.  Skin:    General: Skin is warm and dry.  Neurological:     General: No focal deficit present.     Gait: Gait abnormal.    Review of Systems  All other systems reviewed and are negative.  Blood pressure 105/68, pulse 80, temperature 98.3 F (36.8 C), temperature source Oral, resp. rate 18, SpO2 97%. There is no height or weight on file to calculate BMI.  Treatment Plan Summary: Daily contact with patient to assess and evaluate symptoms and progress in treatment and Medication management  Alcohol Use Disorder Vitals reviewed: normotensive with heart rate of 80. 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

## 2024-01-24 DIAGNOSIS — R569 Unspecified convulsions: Secondary | ICD-10-CM | POA: Diagnosis not present

## 2024-01-24 DIAGNOSIS — F10930 Alcohol use, unspecified with withdrawal, uncomplicated: Secondary | ICD-10-CM

## 2024-01-24 LAB — CBC
HCT: 35.2 % — ABNORMAL LOW (ref 39.0–52.0)
Hemoglobin: 11.8 g/dL — ABNORMAL LOW (ref 13.0–17.0)
MCH: 31.5 pg (ref 26.0–34.0)
MCHC: 33.5 g/dL (ref 30.0–36.0)
MCV: 93.9 fL (ref 80.0–100.0)
Platelets: 301 10*3/uL (ref 150–400)
RBC: 3.75 MIL/uL — ABNORMAL LOW (ref 4.22–5.81)
RDW: 13.8 % (ref 11.5–15.5)
WBC: 4.5 10*3/uL (ref 4.0–10.5)
nRBC: 0 % (ref 0.0–0.2)

## 2024-01-24 LAB — HIV ANTIBODY (ROUTINE TESTING W REFLEX): HIV Screen 4th Generation wRfx: NONREACTIVE

## 2024-01-24 LAB — COMPREHENSIVE METABOLIC PANEL
ALT: 51 U/L — ABNORMAL HIGH (ref 0–44)
AST: 86 U/L — ABNORMAL HIGH (ref 15–41)
Albumin: 3.6 g/dL (ref 3.5–5.0)
Alkaline Phosphatase: 81 U/L (ref 38–126)
Anion gap: 9 (ref 5–15)
BUN: 8 mg/dL (ref 6–20)
CO2: 22 mmol/L (ref 22–32)
Calcium: 9.5 mg/dL (ref 8.9–10.3)
Chloride: 104 mmol/L (ref 98–111)
Creatinine, Ser: 0.96 mg/dL (ref 0.61–1.24)
GFR, Estimated: >60 mL/min (ref >60)
Glucose, Bld: 96 mg/dL (ref 70–99)
Potassium: 3.9 mmol/L (ref 3.5–5.1)
Sodium: 135 mmol/L (ref 135–145)
Total Bilirubin: 0.7 mg/dL (ref 0.0–1.2)
Total Protein: 7.5 g/dL (ref 6.5–8.1)

## 2024-01-24 MED ORDER — FOLIC ACID 1 MG PO TABS
1.0000 mg | ORAL_TABLET | Freq: Every day | ORAL | Status: DC
  Administered 2024-01-24 – 2024-01-25 (×2): 1 mg via ORAL
  Filled 2024-01-24 (×2): qty 1

## 2024-01-24 MED ORDER — QUETIAPINE FUMARATE 100 MG PO TABS
100.0000 mg | ORAL_TABLET | Freq: Every day | ORAL | Status: DC
  Administered 2024-01-24 (×2): 100 mg via ORAL
  Filled 2024-01-24: qty 1
  Filled 2024-01-24: qty 4

## 2024-01-24 MED ORDER — LACTATED RINGERS IV SOLN: INTRAVENOUS | Status: DC

## 2024-01-24 NOTE — Evaluation (Addendum)
 Physical Therapy Evaluation Patient Details Name: Xavier Owens MRN: [id number] DOB: 11/07/1971 Today's Date: 01/24/2024  History of Present Illness  Patient is 53 y.o. male presents from [facility] with after a seizure. Patient has been at [facility] since 01/18/24 seeking alcohol use disorder treatment. CTH with no acute findings. PMH significant for HTN, ETOH abuse, seizures, mild cognitive deficits at baseline.   Clinical Impression  Xavier Owens is 53 y.o. male admitted with above HPI and diagnosis. Patient is currently limited by functional impairments below (see PT problem list). Patient lives alone and is independent with no AD for mobility at baseline. Currently pt is limited by impaired balance, weakness, and impaired motor coordination with tremulous movements secondary to ETOH withdrawal. Pt currently requires CGA for safety with transfers and CGA to Min assist for gait with UE support. Anticipate balance will be improved with use of RW. Patient will benefit from continued skilled PT interventions to address impairments and progress independence with mobility, recommending pt continue with ETOH rehab; if unable to admit to detox facility recommend STR at continued inpatient follow up therapy, <3 hours/day. Acute PT will follow and progress as able.         If plan is discharge home, recommend the following: A little help with walking and/or transfers;A little help with bathing/dressing/bathroom;Assistance with cooking/housework;Assist for transportation;Direct supervision/assist for medications management;Help with stairs or ramp for entrance   Can travel by private vehicle        Equipment Recommendations Rolling walker (2 wheels)  Recommendations for Other Services       Functional Status Assessment Patient has had a recent decline in their functional status and demonstrates the ability to make significant improvements in function in a reasonable and predictable amount of time.      Precautions / Restrictions Precautions Precautions: Fall Recall of Precautions/Restrictions: Intact Precaution/Restrictions Comments: CIWA Restrictions Weight Bearing Restrictions Per Provider Order: No      Mobility  Bed Mobility Overal bed mobility: Modified Independent             General bed mobility comments: Mod Ind in stretcher for supine>sit, HOB slightly elevated    Transfers Overall transfer level: Needs assistance Equipment used: None Transfers: Sit to/from Stand Sit to Stand: Contact guard assist           General transfer comment: CGA for safety, pt reliant on UE support for rise and lower, tremulous with movements.    Ambulation/Gait Ambulation/Gait assistance: Min assist, Contact guard assist Gait Distance (Feet): 150 Feet Assistive device: IV Pole, None Gait Pattern/deviations: Step-through pattern, Decreased stride length, Shuffle, Trunk flexed Gait velocity: decr     General Gait Details: pt unsteady and tremulous at start. Single UE support with Lt UE then Rt UE (no significant difference between the two). Pt with flexed psoture, shortened steps and mild sway. Min assist fading to CGA as step length and posture improved with distance. UE support removed and gait stability worsened with pt requiring min assist to prevent LOB for short bout.  Stairs            Wheelchair Mobility     Tilt Bed    Modified Rankin (Stroke Patients Only)       Balance Overall balance assessment: Needs assistance Sitting-balance support: Feet supported Sitting balance-Leahy Scale: Fair     Standing balance support: Single extremity supported, During functional activity, No upper extremity supported, Reliant on assistive device for balance Standing balance-Leahy Scale: Poor  Pertinent Vitals/Pain Pain Assessment Pain Assessment: No/denies pain    Home Living Family/patient expects to be discharged to::  Private residence Living Arrangements: Alone Available Help at Discharge: Family;Available PRN/intermittently Type of Home: House Home Access: Stairs to enter Entrance Stairs-Rails: None Entrance Stairs-Number of Steps: 4   Home Layout: One level   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    Prior Function Prior Level of Function : Independent/Modified Independent;Driving             Mobility Comments: amb with no device ADLs Comments: doesn't shower if doesn't feel sweaty     Extremity/Trunk Assessment   Upper Extremity Assessment Upper Extremity Assessment: Defer to OT evaluation (tremulous)    Lower Extremity Assessment Lower Extremity Assessment: Generalized weakness;RLE deficits/detail;LLE deficits/detail RLE Deficits / Details: mild tremor noted in Rt LE, grossly 3/5 for hip strength and 4/5 for quad/hamstring and 4+ for dorsiflexion. RLE Sensation: history of peripheral neuropathy RLE Coordination: decreased gross motor LLE Deficits / Details: grossly 3/5 for hip strength and 4/5 for quad/hamstring and 4+ for dorsiflexion. LLE Sensation: history of peripheral neuropathy LLE Coordination: decreased gross motor    Cervical / Trunk Assessment Cervical / Trunk Assessment: Normal  Communication   Communication Communication: No apparent difficulties    Cognition Arousal: Alert Behavior During Therapy: WFL for tasks assessed/performed   PT - Cognitive impairments: No apparent impairments                       PT - Cognition Comments: tues instead of wed, 15th insteady of 12th Following commands: Intact       Cueing Cueing Techniques: Verbal cues     General Comments      Exercises     Assessment/Plan    PT Assessment Patient needs continued PT services  PT Problem List Decreased strength;Decreased activity tolerance;Decreased balance;Decreased  mobility;Decreased coordination;Decreased knowledge of use of DME;Decreased safety awareness;Decreased knowledge of precautions;Impaired sensation       PT Treatment Interventions DME instruction;Patient/family education;Neuromuscular re-education;Balance training;Therapeutic exercise;Therapeutic activities;Functional mobility training;Stair training;Gait training    PT Goals (Current goals can be found in the Care Plan section)  Acute Rehab PT Goals Patient Stated Goal: recover from ETOH abuse and regain independence with mobility PT Goal Formulation: With patient Time For Goal Achievement: 02/07/24 Potential to Achieve Goals: Good    Frequency Min 2X/week     Co-evaluation               AM-PAC PT "6 Clicks" Mobility  Outcome Measure Help needed turning from your back to your side while in a flat bed without using bedrails?: None Help needed moving from lying on your back to sitting on the side of a flat bed without using bedrails?: None Help needed moving to and from a bed to a chair (including a wheelchair)?: A Little Help needed standing up from a chair using your arms (e.g., wheelchair or bedside chair)?: A Little Help needed to walk in hospital room?: A Little Help needed climbing 3-5 steps with a railing? : A Lot 6 Click Score: 19    End of Session Equipment Utilized During Treatment: Gait belt Activity Tolerance: Patient tolerated treatment well Patient left: in bed;with call bell/phone within reach Nurse Communication: Mobility status PT Visit Diagnosis: Unsteadiness on feet (R26.81);Other abnormalities of gait and mobility (R26.89);Muscle weakness (generalized) (M62.81);Difficulty in walking, not elsewhere classified (R26.2);Other symptoms and signs involving  the nervous system (R29.898)    Time: 1610-9604 PT Time Calculation (min) (ACUTE ONLY): 20 min   Charges:   PT Evaluation $PT Eval Moderate Complexity: 1 Mod   PT General Charges $$ ACUTE PT VISIT: 1  Visit         Wynn Maudlin, DPT Acute Rehabilitation Services Office [phone]  01/24/24 10:40 AM

## 2024-01-24 NOTE — Evaluation (Signed)
 Occupational Therapy Evaluation Patient Details Name: Xavier Owens MRN: [id number] DOB: 1971/06/08 Today's Date: 01/24/2024   History of Present Illness   Patient is 53 y.o. male presents from [facility] with after a seizure. Patient has been at [facility] since 01/18/24 seeking alcohol use disorder treatment. CTH with no acute findings. PMH significant for HTN, ETOH abuse, seizures, mild cognitive deficits at baseline.     Clinical Impressions Pt currently at min guard to supervision for selfcare tasks sit to stand, functional grooming tasks, and mobility without assistive device.  He exhibits slight tremor in the LUE greater than the right with BP in sitting slightly low at 91/61 but no report of symptoms stated.  Prior to admission he was living alone but reports he is moving back with his spouse or she is moving in soon, so he will have greater supervision.  Feel he will benefit from acute care OT at this time in order to progress to a modified independent level for eventual transition back home post ETOH rehab stay. Recommend continued rehab for ETOH as pt wants this and would benefit from it to help get his life back on track.       If plan is discharge home, recommend the following:   A little help with walking and/or transfers;A little help with bathing/dressing/bathroom     Functional Status Assessment   Patient has had a recent decline in their functional status and demonstrates the ability to make significant improvements in function in a reasonable and predictable amount of time.     Equipment Recommendations   None recommended by OT      Precautions/Restrictions   Precautions Precautions: Fall Recall of Precautions/Restrictions: Intact Precaution/Restrictions Comments: CIWA Restrictions Weight Bearing Restrictions Per Provider Order: No     Mobility Bed Mobility               General bed mobility comments: Pt sitting edge of stretcher    Transfers Overall  transfer level: Needs assistance Equipment used: None Transfers: Sit to/from Stand, Bed to chair/wheelchair/BSC Sit to Stand: Supervision     Step pivot transfers: Contact guard assist     General transfer comment: Contact guard for functional mobility without use of an assistive device around the hallway X2.      Balance Overall balance assessment: Needs assistance Sitting-balance support: Feet supported Sitting balance-Leahy Scale: Fair     Standing balance support: During functional activity, No upper extremity supported Standing balance-Leahy Scale: Fair Standing balance comment: Pt able to stand at the sink for grooming tasks without support, min guarf for ambulation without assistive device.                           ADL either performed or assessed with clinical judgement   ADL Overall ADL's : Needs assistance/impaired Eating/Feeding: Supervision/ safety   Grooming: Wash/dry face;Oral care;Supervision/safety;Standing   Upper Body Bathing: Set up;Sitting   Lower Body Bathing: Supervison/ safety;Sit to/from stand   Upper Body Dressing : Set up;Sitting   Lower Body Dressing: Supervision/safety;Sit to/from stand   Toilet Transfer: Electronics engineer Details (indicate cue type and reason): no device Toileting- Clothing Manipulation and Hygiene: Supervision/safety;Sit to/from stand       Functional mobility during ADLs: Contact guard assist (ambulation without assistive device) General ADL Comments: Pt with BP at 91/61 in sitting wthout report of light headedness.  Pt with reports of right knee pain in standing and with mobility which was  premorbid.     Vision Baseline Vision/History: 0 No visual deficits Ability to See in Adequate Light: 0 Adequate Patient Visual Report: No change from baseline       Perception Perception: Not tested       Praxis Praxis: Not tested       Pertinent Vitals/Pain Pain Assessment Pain  Assessment: Faces Faces Pain Scale: Hurts a little bit Pain Location: left knee Pain Intervention(s): Limited activity within patient's tolerance, Repositioned     Extremity/Trunk Assessment Upper Extremity Assessment Upper Extremity Assessment: Generalized weakness (tremors noted LUE greater than RUE)   Lower Extremity Assessment Lower Extremity Assessment: Defer to PT evaluation RLE Deficits / Details: mild tremor noted in Rt LE, grossly 3/5 for hip strength and 4/5 for quad/hamstring and 4+ for dorsiflexion. RLE Sensation: history of peripheral neuropathy RLE Coordination: decreased gross motor LLE Deficits / Details: grossly 3/5 for hip strength and 4/5 for quad/hamstring and 4+ for dorsiflexion. LLE Sensation: history of peripheral neuropathy LLE Coordination: decreased gross motor   Cervical / Trunk Assessment Cervical / Trunk Assessment: Normal   Communication Communication Communication: No apparent difficulties   Cognition Arousal: Alert Behavior During Therapy: WFL for tasks assessed/performed                                 Following commands: Intact                  Home Living Family/patient expects to be discharged to:: Private residence Living Arrangements: Alone (lives alone but says he will be living with his spouse soon) Available Help at Discharge: Family;Available PRN/intermittently Type of Home: House Home Access: Stairs to enter [employer] of Steps: 4 Entrance Stairs-Rails: None Home Layout: One level     Bathroom Shower/Tub: Chief Strategy Officer: Standard Bathroom Accessibility: Yes   Home Equipment: None   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      Prior Functioning/Environment Prior Level of Function : Independent/Modified Independent;Driving             Mobility Comments: amb with no device ADLs  Comments: doesn't shower if doesn't feel sweaty    OT Problem List: Decreased strength;Impaired balance (sitting and/or standing)   OT Treatment/Interventions: Self-care/ADL training;Balance training;Therapeutic activities;Patient/family education;DME and/or AE instruction      OT Goals(Current goals can be found in the care plan section)   Acute Rehab OT Goals Patient Stated Goal: Pt wants to continue getting ETOH rehab OT Goal Formulation: With patient Time For Goal Achievement: 02/07/24 Potential to Achieve Goals: Good   OT Frequency:  Min 2X/week       AM-PAC OT "6 Clicks" Daily Activity     Outcome Measure Help from another person eating meals?: None Help from another person taking care of personal grooming?: A Little Help from another person toileting, which includes using toliet, bedpan, or urinal?: A Little Help from another person bathing (including washing, rinsing, drying)?: A Little Help from another person to put on and taking off regular upper body clothing?: A Little Help from another person to put on and taking off regular lower body clothing?: A Little 6 Click Score: 19   End of Session Equipment Utilized During Treatment: Gait belt Nurse Communication: Mobility status  Activity Tolerance: Patient tolerated treatment well Patient left: in bed;with call bell/phone within reach  OT Visit  Diagnosis: Unsteadiness on feet (R26.81)                Time: 2841-3244 OT Time Calculation (min): 31 min Charges:  OT General Charges $OT Visit: 1 Visit OT Evaluation $OT Eval Moderate Complexity: 1 Mod OT Treatments $Self Care/Home Management : 8-22 mins  Perrin Maltese, OTR/L Acute Rehabilitation Services  Office [phone] 01/24/2024

## 2024-01-24 NOTE — Progress Notes (Signed)
 CSW added substance abuse resources to patient's AVS.  Edwin Dada, MSW, LCSW Transitions of Care  Clinical Social Worker II [phone]

## 2024-01-24 NOTE — ED Notes (Signed)
 Writer received a call from Dr. Ned Card MD requesting information about pt medications and activities relating to the seizure activity which occurred earlier today.

## 2024-01-24 NOTE — ED Notes (Signed)
 MD at bedside.

## 2024-01-24 NOTE — ED Notes (Signed)
 Patient ambulated to bathroom with no assistance.

## 2024-01-24 NOTE — Progress Notes (Addendum)
  Overnight events: None.  Subjective: Patient evaluated at bedside. Reports feeling shaky, but improved from yesterday. Overall feeling better this morning.   Objective:  Vital signs in last 24 hours: Vitals:   01/24/24 0500 01/24/24 0710 01/24/24 0712 01/24/24 1058  BP: 98/73  100/70 91/61  Pulse:   79 77  Resp:   14 18  Temp:  97.9 F (36.6 C)  97.9 F (36.6 C)  TempSrc:  Oral  Oral  SpO2:   100% 100%  Weight:      Height:       Physical Exam: Constitutional: Well-appearing, resting tremor in right hand, no acute distress Cardio: Regular rate and rhythm, no murmurs Pulm: Clear to auscultation bilaterally Abdomen: Soft, nontender, nondistended Skin: No lesions or skin changes Neuro: Alert and oriented to person and time, not oriented to place (thinks [employer]); resting tremor in right hand, does not change with intention; mild generalized tremulousness; bradykinesia bilaterally with finger-to-nose Psych: Normal mood and affect  Labs: Mild normocytic anemia  Assessment/Plan: Principal Problem:   Alcohol withdrawal seizure (HCC)  Alcohol Withdrawal Seizure Alcohol use disorder Currently on a Librium taper, doing well. Low CIWA scores. Still with some tremulousness, but no other new concerns this morning.  Suspect this seizure was secondary to inadequate benzodiazepine dosing. He may have required a longer than standard course for his withdrawals.  - Continue Librium taper - Continue thiamine supplementation, multivitamin, folate - LR 75cc/hr for 12 hours - Naltrexone at discharge  Essential Tremor Predominantly in right hand, perhaps worsened in the setting of alcohol withdrawal. On home propranolol 10 mg BID, continued.   Substance-induced mood disorder Continue home meds (duloxetine 20 mg, atarax 25 mg q6h for anxiety, seroquel 100 mg nightly, trazodone 100 mg nightly).  Diet: Normal IVF: LR 75cc/hr VTE: Enoxaparin Code: Full   Dispo: Anticipated discharge  to  Home vs SNF  pending medical stability and safe dispo plan.   Annett Fabian, MD 01/24/2024, 11:19 AM Pager: [phone] After 5pm on weekdays and 1pm on weekends: On Call pager [phone]

## 2024-01-25 ENCOUNTER — Other Ambulatory Visit: Payer: Self-pay

## 2024-01-25 ENCOUNTER — Inpatient Hospital Stay (HOSPITAL_COMMUNITY)
Admission: AD | Admit: 2024-01-25 | Discharge: 2024-02-03 | DRG: 885 | Disposition: A | Discharge status: Home / Self Care | Payer: MEDICAID | Source: Intra-hospital | Attending: Psychiatry | Admitting: Psychiatry

## 2024-01-25 ENCOUNTER — Encounter (HOSPITAL_COMMUNITY): Payer: Self-pay | Admitting: Family

## 2024-01-25 DIAGNOSIS — F17210 Nicotine dependence, cigarettes, uncomplicated: Secondary | ICD-10-CM | POA: Diagnosis present

## 2024-01-25 DIAGNOSIS — F419 Anxiety disorder, unspecified: Secondary | ICD-10-CM | POA: Diagnosis present

## 2024-01-25 DIAGNOSIS — F515 Nightmare disorder: Secondary | ICD-10-CM

## 2024-01-25 DIAGNOSIS — H9190 Unspecified hearing loss, unspecified ear: Secondary | ICD-10-CM | POA: Diagnosis present

## 2024-01-25 DIAGNOSIS — E559 Vitamin D deficiency, unspecified: Secondary | ICD-10-CM | POA: Diagnosis present

## 2024-01-25 DIAGNOSIS — F332 Major depressive disorder, recurrent severe without psychotic features: Principal | ICD-10-CM | POA: Diagnosis present

## 2024-01-25 DIAGNOSIS — F10239 Alcohol dependence with withdrawal, unspecified: Secondary | ICD-10-CM | POA: Diagnosis present

## 2024-01-25 DIAGNOSIS — G629 Polyneuropathy, unspecified: Secondary | ICD-10-CM | POA: Diagnosis present

## 2024-01-25 DIAGNOSIS — F10939 Alcohol use, unspecified with withdrawal, unspecified: Secondary | ICD-10-CM | POA: Diagnosis not present

## 2024-01-25 DIAGNOSIS — F10930 Alcohol use, unspecified with withdrawal, uncomplicated: Secondary | ICD-10-CM | POA: Diagnosis not present

## 2024-01-25 DIAGNOSIS — I10 Essential (primary) hypertension: Secondary | ICD-10-CM | POA: Diagnosis present

## 2024-01-25 DIAGNOSIS — R32 Unspecified urinary incontinence: Secondary | ICD-10-CM | POA: Diagnosis present

## 2024-01-25 DIAGNOSIS — Z79899 Other long term (current) drug therapy: Secondary | ICD-10-CM | POA: Diagnosis not present

## 2024-01-25 DIAGNOSIS — G4700 Insomnia, unspecified: Secondary | ICD-10-CM | POA: Diagnosis present

## 2024-01-25 DIAGNOSIS — R569 Unspecified convulsions: Secondary | ICD-10-CM | POA: Diagnosis not present

## 2024-01-25 DIAGNOSIS — G8929 Other chronic pain: Secondary | ICD-10-CM | POA: Diagnosis present

## 2024-01-25 DIAGNOSIS — F1020 Alcohol dependence, uncomplicated: Secondary | ICD-10-CM | POA: Diagnosis present

## 2024-01-25 DIAGNOSIS — E781 Pure hyperglyceridemia: Secondary | ICD-10-CM | POA: Diagnosis present

## 2024-01-25 LAB — BASIC METABOLIC PANEL
Anion gap: 10 (ref 5–15)
BUN: 8 mg/dL (ref 6–20)
CO2: 24 mmol/L (ref 22–32)
Calcium: 9.2 mg/dL (ref 8.9–10.3)
Chloride: 104 mmol/L (ref 98–111)
Creatinine, Ser: 0.79 mg/dL (ref 0.61–1.24)
GFR, Estimated: >60 mL/min (ref >60)
Glucose, Bld: 93 mg/dL (ref 70–99)
Potassium: 4.3 mmol/L (ref 3.5–5.1)
Sodium: 138 mmol/L (ref 135–145)

## 2024-01-25 LAB — CBC
HCT: 35.1 % — ABNORMAL LOW (ref 39.0–52.0)
Hemoglobin: 11.7 g/dL — ABNORMAL LOW (ref 13.0–17.0)
MCH: 31.5 pg (ref 26.0–34.0)
MCHC: 33.3 g/dL (ref 30.0–36.0)
MCV: 94.4 fL (ref 80.0–100.0)
Platelets: 292 10*3/uL (ref 150–400)
RBC: 3.72 MIL/uL — ABNORMAL LOW (ref 4.22–5.81)
RDW: 13.8 % (ref 11.5–15.5)
WBC: 4.6 10*3/uL (ref 4.0–10.5)
nRBC: 0 % (ref 0.0–0.2)

## 2024-01-25 MED ORDER — MAGNESIUM HYDROXIDE 400 MG/5ML PO SUSP
30.0000 mL | Freq: Every day | ORAL | Status: DC | PRN
Start: 2024-01-25 — End: 2024-02-03

## 2024-01-25 MED ORDER — DIPHENHYDRAMINE HCL 25 MG PO CAPS: 50.0000 mg | ORAL_CAPSULE | Freq: Three times a day (TID) | ORAL | Status: DC | PRN

## 2024-01-25 MED ORDER — FOLIC ACID 1 MG PO TABS
1.0000 mg | ORAL_TABLET | Freq: Every day | ORAL | Status: DC
  Administered 2024-01-26 – 2024-02-02 (×8): 1 mg via ORAL
  Filled 2024-01-25 (×9): qty 1

## 2024-01-25 MED ORDER — TRAZODONE HCL 100 MG PO TABS
100.0000 mg | ORAL_TABLET | Freq: Every day | ORAL | Status: DC
  Administered 2024-01-25 – 2024-01-30 (×6): 100 mg via ORAL
  Filled 2024-01-25 (×8): qty 1

## 2024-01-25 MED ORDER — DULOXETINE HCL 20 MG PO CPEP
20.0000 mg | ORAL_CAPSULE | Freq: Every day | ORAL | Status: DC
  Administered 2024-01-26 – 2024-01-30 (×5): 20 mg via ORAL
  Filled 2024-01-25 (×6): qty 1

## 2024-01-25 MED ORDER — ACETAMINOPHEN 325 MG PO TABS
650.0000 mg | ORAL_TABLET | Freq: Four times a day (QID) | ORAL | Status: DC | PRN
  Administered 2024-01-29 – 2024-02-03 (×7): 650 mg via ORAL
  Filled 2024-01-25 (×7): qty 2

## 2024-01-25 MED ORDER — FOLIC ACID 1 MG PO TABS: 1.0000 mg | ORAL_TABLET | Freq: Every day | ORAL | Status: DC

## 2024-01-25 MED ORDER — RIVAROXABAN 10 MG PO TABS
10.0000 mg | ORAL_TABLET | Freq: Every day | ORAL | Status: DC
  Administered 2024-01-26 – 2024-02-03 (×9): 10 mg via ORAL
  Filled 2024-01-25 (×12): qty 1

## 2024-01-25 MED ORDER — HALOPERIDOL 5 MG PO TABS: 5.0000 mg | ORAL_TABLET | Freq: Three times a day (TID) | ORAL | Status: DC | PRN

## 2024-01-25 MED ORDER — CHLORDIAZEPOXIDE HCL 25 MG PO CAPS
25.0000 mg | ORAL_CAPSULE | Freq: Four times a day (QID) | ORAL | Status: AC | PRN
  Administered 2024-01-25 – 2024-01-26 (×4): 25 mg via ORAL
  Filled 2024-01-25 (×4): qty 1

## 2024-01-25 MED ORDER — GABAPENTIN 300 MG PO CAPS
600.0000 mg | ORAL_CAPSULE | Freq: Three times a day (TID) | ORAL | Status: DC
  Administered 2024-01-25 – 2024-02-03 (×27): 600 mg via ORAL
  Filled 2024-01-25 (×30): qty 2

## 2024-01-25 MED ORDER — HYDROXYZINE HCL 25 MG PO TABS
25.0000 mg | ORAL_TABLET | Freq: Four times a day (QID) | ORAL | Status: AC | PRN
Start: 2024-01-25 — End: 2024-01-26
  Administered 2024-01-25: 25 mg via ORAL
  Filled 2024-01-25: qty 1

## 2024-01-25 MED ORDER — LOPERAMIDE HCL 2 MG PO CAPS: 2.0000 mg | ORAL_CAPSULE | ORAL | Status: AC | PRN

## 2024-01-25 MED ORDER — PROPRANOLOL HCL 10 MG PO TABS
10.0000 mg | ORAL_TABLET | Freq: Two times a day (BID) | ORAL | Status: DC
  Administered 2024-01-25 – 2024-02-03 (×18): 10 mg via ORAL
  Filled 2024-01-25 (×21): qty 1

## 2024-01-25 MED ORDER — ADULT MULTIVITAMIN W/MINERALS CH
1.0000 | ORAL_TABLET | Freq: Every day | ORAL | Status: DC
  Administered 2024-01-26 – 2024-02-03 (×9): 1 via ORAL
  Filled 2024-01-25 (×10): qty 1

## 2024-01-25 MED ORDER — THIAMINE HCL 100 MG/ML IJ SOLN
100.0000 mg | Freq: Every day | INTRAMUSCULAR | Status: DC
Start: 2024-01-26 — End: 2024-01-31

## 2024-01-25 MED ORDER — VITAMIN B-1 100 MG PO TABS
100.0000 mg | ORAL_TABLET | Freq: Every day | ORAL | Status: DC
Start: 2024-01-26 — End: 2024-02-03
  Administered 2024-01-26 – 2024-02-03 (×9): 100 mg via ORAL
  Filled 2024-01-25 (×11): qty 1

## 2024-01-25 MED ORDER — QUETIAPINE FUMARATE 100 MG PO TABS
100.0000 mg | ORAL_TABLET | Freq: Every day | ORAL | Status: DC
  Administered 2024-01-25 – 2024-01-30 (×6): 100 mg via ORAL
  Filled 2024-01-25 (×7): qty 1

## 2024-01-25 MED ORDER — CHLORDIAZEPOXIDE HCL 25 MG PO CAPS
25.0000 mg | ORAL_CAPSULE | Freq: Every day | ORAL | Status: AC
  Administered 2024-01-26: 25 mg via ORAL
  Filled 2024-01-25: qty 1

## 2024-01-25 MED ORDER — ALUM & MAG HYDROXIDE-SIMETH 200-200-20 MG/5ML PO SUSP: 30.0000 mL | ORAL | Status: DC | PRN

## 2024-01-25 NOTE — Group Note (Deleted)
 Group Topic: Spirituality in Recovery  Group Date: 01/25/2024 Start Time: 1045 End Time: 1150 Facilitators: Loleta Dicker, LCSW  Department: [facility]  Number of Participants: 4  Group Focus: clarity of thought, communication, reality orientation, and self-awareness Treatment Modality:  Cognitive Behavioral Therapy and Spiritual Interventions utilized were story telling and support Purpose: express feelings, increase insight, regain self-worth, and reinforce self-care   Name: Xavier Owens Date of Birth: 07-Aug-1971  MR: [id number]    Level of Participation: {THERAPIES; PSYCH GROUP PARTICIPATION WUJWJ:19147} Quality of Participation: {THERAPIES; PSYCH QUALITY OF PARTICIPATION:23992} Interactions with others: {THERAPIES; PSYCH INTERACTIONS:23993} Mood/Affect: {THERAPIES; PSYCH MOOD/AFFECT:23994} Triggers (if applicable): *** Cognition: {THERAPIES; PSYCH COGNITION:23995} Progress: {THERAPIES; PSYCH PROGRESS:23997} Response: *** Plan: {THERAPIES; PSYCH WGNF:62130}  Patients Problems:  Patient Active Problem List   Diagnosis Date Noted   Alcohol withdrawal seizure (HCC) 01/23/2024   Alcohol use disorder, severe, dependence (HCC) 01/18/2024   Healthcare maintenance 06/10/2022   Nightmares 12/21/2021   Subclinical hypothyroidism 12/21/2021   Substance induced mood disorder (HCC) 12/14/2021   Alcohol use disorder 06/23/2021   Seizures (HCC) 06/22/2021   Elevated liver enzymes 04/19/2016   Essential hypertension 04/19/2016

## 2024-01-25 NOTE — Progress Notes (Signed)
 Called report to receiving facility, all questions answered, all belongings with patient.

## 2024-01-25 NOTE — [facility] Group Notes (Signed)
 [facility] Group Notes:  (Nursing/MHT/Case Management/Adjunct)  Date:  01/25/2024  Time:  9:18 PM  Type of Therapy:   Wrap-up group  Participation Level:  Minimal  Participation Quality:  Appropriate  Affect:  Flat  Cognitive:  Appropriate  Insight:  Lacking  Engagement in Group:  Limited  Modes of Intervention:  Education  Summary of Progress/Problems: Pt admitted today, had no goal. Rated day 1/10.  Noah Delaine 01/25/2024, 9:18 PM

## 2024-01-25 NOTE — TOC Transition Note (Signed)
 Transition of Care [facility]) - Discharge Note   Patient Details  Name: Xavier Owens MRN: [id number] Date of Birth: Sep 10, 1971  [facility] CM/SW Contact:  Baldemar Lenis, LCSW Phone Number: 01/25/2024, 12:07 PM   Clinical Narrative:   CSW updated by psychiatry that patient has a bed available at [facility] today. CSW met with patient to discuss, he is in agreement. Voluntary form signed and faxed to [facility], original will go with patient with transport. CSW coordinated with MD for discharge. Bed is available when patient is ready. Patient asking for a shower, RN is aware. Safe Transport arranged for patient transfer.   Nurse to call report to [phone] Room 301-2.    Final next level of care: [facility] Barriers to Discharge: Barriers Resolved   Patient Goals and CMS Choice Patient states their goals for this hospitalization and ongoing recovery are:: to get to [facility] CMS Medicare.gov Compare Post Acute Care list provided to:: Patient Choice offered to / list presented to : Patient      Discharge Placement                Patient to be transferred to facility by: Safe Transport Name of family member notified: Self Patient and family notified of of transfer: 01/25/24  Discharge Plan and Services Additional resources added to the After Visit Summary for                                       Social Drivers of Health (SDOH) Interventions SDOH Screenings   Food Insecurity: No Food Insecurity (01/24/2024)  Housing: Low Risk  (01/24/2024)  Transportation Needs: No Transportation Needs (01/24/2024)  Utilities: Not At Risk (01/24/2024)  Alcohol Screen: High Risk (12/08/2021)  Depression (PHQ2-9): Low Risk  (01/23/2024)  Recent Concern: Depression (PHQ2-9) - High Risk (01/18/2024)  Social Connections: Socially Integrated (01/24/2024)  Tobacco Use: Medium Risk (01/23/2024)     Readmission Risk Interventions     No data to display

## 2024-01-25 NOTE — Progress Notes (Signed)
 Patient admitted to unit, oriented to unit, patient in his room sitting down reading admission information, denies any needs at this time.

## 2024-01-25 NOTE — Discharge Summary (Addendum)
 Name: Xavier Owens MRN: [id number] DOB: 1971-02-06 53 y.o. PCP: Lovie Macadamia, MD  Date of Admission: 01/23/2024  3:19 PM Date of Discharge:  01/25/2024 Attending Physician: Dr. Cleda Daub  DISCHARGE DIAGNOSIS:  Primary Problem: Alcohol withdrawal seizure [facility])   Hospital Problems: Principal Problem:   Alcohol withdrawal seizure (HCC)    DISCHARGE MEDICATIONS:   Allergies as of 01/25/2024   No Known Allergies      Medication List     TAKE these medications    chlordiazePOXIDE 25 MG capsule Commonly known as: LIBRIUM 50mg  PO TID x 1D, then 25-50mg  PO BID X 1D, then 25-50mg  PO QD X 1D   DULoxetine 20 MG capsule Commonly known as: CYMBALTA Take 1 capsule (20 mg total) by mouth daily.   folic acid 1 MG tablet Commonly known as: FOLVITE Take 1 tablet (1 mg total) by mouth daily. Start taking on: January 26, 2024   gabapentin 300 MG capsule Commonly known as: NEURONTIN Take 2 capsules (600 mg total) by mouth 3 (three) times daily.   multivitamin with minerals Tabs tablet Take 1 tablet by mouth daily.   propranolol 10 MG tablet Commonly known as: INDERAL Take 1 tablet (10 mg total) by mouth 2 (two) times daily.   QUEtiapine 100 MG tablet Commonly known as: SEROquel Take 1 tablet (100 mg total) by mouth at bedtime.   thiamine 100 MG tablet Commonly known as: VITAMIN B1 Take 100 mg by mouth daily.   traZODone 100 MG tablet Commonly known as: DESYREL Take 100 mg by mouth at bedtime.        DISPOSITION AND FOLLOW-UP:  Xavier Owens was discharged from [facility] in Fair condition and transferred to the [facility]. At the hospital follow up visit please address:  Follow-up Recommendations: Medications: Continue Librium taper (25 mg on 3/14, 25 mg q6h prn for CIWA > 10). Consider adding Naltrexone 50 mg daily at discharge. Reassess other home psychiatric medications.   Follow-up Appointments:  Follow-up  Information     Lovie Macadamia, MD Follow up on 01/30/2024.   Specialty: Internal Medicine Why: You have an appointment at 8:45am. Contact information: [address] [place] [address] [phone]                HOSPITAL COURSE:  Patient Summary: Alcohol Withdrawal Seizure Alcohol use disorder He arrived at the emergency department from the [facility], which he was at since 3/6 for alcohol use disorder treatment.  At the facility, he was started on Ativan taper then transition to a Librium taper.  At some point he experienced a seizure and was brought to the emergency department.  Seizure workup was negative for any other acute causes. No  infectious symptoms.  CT head was negative.  He was afebrile, hemodynamically stable.  Last drink was apparently on 3/4.  Seizure is most likely secondary to alcohol withdrawal.  He previously had a seizure during alcohol withdrawal a few years ago.  He was started on a Librium taper in the hospital and has been doing well.  He was also given thiamine, multivitamins, folate, and LR. His tremulousness has decreased.  His vitals have remained stable throughout his hospital course.  His CIWA scores have been low.  He was approved for transfer to the behavioral health hospital, which he is agreeable to for continued treatment. He seems motivated to quit drinking alcohol and is requesting Naltrexone at discharge.   Essential Tremor Acute exacerbation of chronic tremor. Predominantly in  right hand, worsened in the setting of alcohol withdrawal. On home propranolol 10 mg BID. Improved during his hospital stay.    Substance-induced mood disorder Per previous psychiatry encounter. Continued on home meds during hospitalization, including duloxetine 20 mg, atarax 25 mg q6h, seroquel 100 mg nightly, trazodone 100 mg nightly.     DISCHARGE INSTRUCTIONS:   Discharge Instructions     Call MD for:  difficulty breathing, headache or visual disturbances   Complete  by: As directed    Call MD for:  extreme fatigue   Complete by: As directed    Call MD for:  persistant dizziness or light-headedness   Complete by: As directed    Call MD for:  persistant nausea and vomiting   Complete by: As directed    Call MD for:  redness, tenderness, or signs of infection (pain, swelling, redness, odor or green/yellow discharge around incision site)   Complete by: As directed    Call MD for:  severe uncontrolled pain   Complete by: As directed    Call MD for:  temperature >100.4   Complete by: As directed    Diet - low sodium heart healthy   Complete by: As directed    Discharge instructions   Complete by: As directed    You were hospitalized for for alcohol withdrawal seizure. Thank you for allowing [place] to be part of your care.   We arranged for you to be transferred to the [facility]. They will continue your care and give you further instructions about your medications at discharge from their facility.   Increase activity slowly   Complete by: As directed        SUBJECTIVE:   Doing well this morning, no new concerns. Tremor is improved, holding a coffee cup with two hands. Spoke with him about transfer to the [facility], which he is agreeable to.  Discharge Vitals:   BP 96/64 (BP Location: Right Arm)   Pulse 86   Temp 98 F (36.7 C) (Oral)   Resp 16   Ht 5' 8.5" (1.74 m)   Wt 77.8 kg   SpO2 97%   BMI 25.70 kg/m   OBJECTIVE:  Physical Exam Constitutional:      Appearance: Normal appearance.  HENT:     Head: Normocephalic and atraumatic.     Nose: Nose normal.     Mouth/Throat:     Mouth: Mucous membranes are dry.  Eyes:     Extraocular Movements: Extraocular movements intact.     Pupils: Pupils are equal, round, and reactive to light.  Cardiovascular:     Rate and Rhythm: Normal rate and regular rhythm.     Heart sounds: Normal heart sounds.  Pulmonary:     Effort: Pulmonary effort is normal.     Breath sounds: Normal breath  sounds.  Abdominal:     General: Abdomen is flat.     Palpations: Abdomen is soft.  Musculoskeletal:        General: Normal range of motion.     Cervical back: Normal range of motion and neck supple.  Skin:    General: Skin is warm and dry.  Neurological:     Mental Status: He is alert. Mental status is at baseline.     Comments: Resting tremor, predominantly in right hand  Psychiatric:        Mood and Affect: Mood normal.        Behavior: Behavior normal.     Pertinent Labs, Studies, and  Procedures:     Latest Ref Rng & Units 01/25/2024    7:08 AM 01/24/2024    5:18 AM 01/23/2024    5:02 PM  CBC  WBC 4.0 - 10.5 K/uL 4.6  4.5    Hemoglobin 13.0 - 17.0 g/dL 16.1  09.6  04.5   Hematocrit 39.0 - 52.0 % 35.1  35.2  36.0   Platelets 150 - 400 K/uL 292  301         Latest Ref Rng & Units 01/25/2024    7:08 AM 01/24/2024    5:18 AM 01/23/2024    5:02 PM  CMP  Glucose 70 - 99 mg/dL 93  96  93   BUN 6 - 20 mg/dL 8  8  9    Creatinine 0.61 - 1.24 mg/dL 4.09  8.11  9.14   Sodium 135 - 145 mmol/L 138  135  133   Potassium 3.5 - 5.1 mmol/L 4.3  3.9  4.3   Chloride 98 - 111 mmol/L 104  104  101   CO2 22 - 32 mmol/L 24  22    Calcium 8.9 - 10.3 mg/dL 9.2  9.5    Total Protein 6.5 - 8.1 g/dL  7.5    Total Bilirubin 0.0 - 1.2 mg/dL  0.7    Alkaline Phos 38 - 126 U/L  81    AST 15 - 41 U/L  86    ALT 0 - 44 U/L  51     DG Knee 2 Views Right Result Date: 01/23/2024  IMPRESSION: No acute osseous abnormality. Small knee effusion. Electronically Signed   By: Jasmine Pang M.D.   On: 01/23/2024 20:22   CT Head Wo Contrast Result Date: 01/23/2024 IMPRESSION: 1. Normal CT appearance of the brain. No acute or focal lesion to explain the patient's seizures. 2. Chronic right maxillary sinus disease. Electronically Signed   By: Marin Roberts M.D.   On: 01/23/2024 18:20    Signed: Annett Fabian, MD Internal Medicine Resident, PGY-1 [facility]  Pager:  [phone] 11:54 AM, 01/25/2024

## 2024-01-25 NOTE — Plan of Care (Signed)

## 2024-01-25 NOTE — Plan of Care (Signed)

## 2024-01-26 ENCOUNTER — Encounter (HOSPITAL_COMMUNITY): Payer: Self-pay

## 2024-01-26 DIAGNOSIS — F332 Major depressive disorder, recurrent severe without psychotic features: Secondary | ICD-10-CM | POA: Diagnosis not present

## 2024-01-26 NOTE — [facility] Suicide Risk Assessment (Signed)
 [facility] INPATIENT:  Family/Significant Other Suicide Prevention Education  Suicide Prevention Education:  Patient Refusal for Family/Significant Other Suicide Prevention Education: The patient Xavier Owens has refused to provide written consent for family/significant other to be provided Family/Significant Other Suicide Prevention Education during admission and/or prior to discharge.  Physician notified.  Elza Rafter 01/26/2024, 11:23 AM

## 2024-01-26 NOTE — [facility] Counselor (Signed)
 Adult Comprehensive Assessment  Patient ID: Xavier Owens, male   DOB: 1971-01-18, 53 y.o.   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 Family Relationships: None reported Surveyor, quantity / Lack of resources (include bankruptcy): None reported Housing / Lack of housing: None reported Physical health (include injuries & life threatening diseases): "getting old" Social relationships: None reported Substance abuse: "All alcohol" Bereavement / Loss: Pt reports his step-mother passed, and they buried her last Saturday  Living/Environment/Situation:  Living Arrangements: Alone Living conditions (as described by patient or guardian): "large" Who else lives in the home?: Pt reports he lives alone but that his dad will be staying with him to take care of things because his step-mother passed away How long has patient lived in current situation?: " Longer than you've been alive" What is atmosphere in current home: Comfortable  Family History:  Marital status: Single Are you sexually active?: No What is your sexual orientation?: "heterosexual" Has your sexual activity been affected by drugs, alcohol, medication, or emotional stress?: "no" Does patient have children?: Yes How many children?: 2 How is patient's relationship with their children?: "great" Pt reports one of his daughters he did not find out about until later  Childhood History:  By whom was/is the patient raised?: Grandparents, Father, Mother/father and step-parent Additional childhood history information: Pt  reports he was raised by his grandmother, aunt, dad and stepmother Description of patient's relationship with caregiver when they were a child: Pt reports they have a good relationship Patient's description of current relationship with people who raised him/her: Pt reports his grandmother and stepmother have passrd. He reports a good relationship with his father How were you disciplined when you got in trouble as a child/adolescent?: Pt reports spanking Does patient have siblings?: Yes Number of Siblings: 2 Description of patient's current relationship with siblings: "great" Pt reports he has a full brother and a step brother Did patient suffer any verbal/emotional/physical/sexual abuse as a child?: No Did patient suffer from severe childhood neglect?: No Has patient ever been sexually abused/assaulted/raped as an adolescent or adult?: No Was the patient ever a victim of a crime or a disaster?: No Witnessed domestic violence?: No Has patient been affected by domestic violence as an adult?: No  Education:  Highest grade of school patient has completed: 12th grade Currently a student?: No Learning disability?: No  Employment/Work Situation:   Employment Situation: Employed Where is Patient Currently Employed?: 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?: Yes (Describe in comment) (Pt reports he was in [employer]) Did You Receive Any Psychiatric Treatment/Services While in the [employer]?: No  Financial Resources:   Financial resources: Income from employment Does patient have a representative payee or guardian?:  No  Alcohol/Substance Abuse:   What has been your use of drugs/alcohol within the last 12 months?: Pt reports that he uses 1/5 of Vodka daily and has been doing that for 30 years If attempted suicide, did drugs/alcohol play a role in this?: No Alcohol/Substance Abuse Treatment Hx: Past Tx, Inpatient If yes, describe treatment: Pt reports he has been to rehab multiple times Has alcohol/substance abuse ever caused legal problems?: No  Social Support System:   Patient's Community Support System: Good Describe Community Support System: " family" Type of faith/religion: "Methodist" How does patient's faith help to cope with current illness?: "going to church on Sunday"  Leisure/Recreation:   Do You Have Hobbies?: Yes Leisure and Hobbies: "Drink and fish"  Strengths/Needs:   What is the patient's perception of their strengths?: "Customer satisfaction" Patient states they can use these personal strengths during their treatment to contribute to their recovery: " I don't know" Patient states these barriers may affect/interfere with their treatment: None reported Patient states these barriers may affect their return to the community: None reported Other important information patient would like considered in planning for their treatment: None reported  Discharge Plan:   Currently receiving community mental health services: No Patient states concerns and preferences for aftercare planning are: Pt reports they would like to see Allen Kell for follow-up Patient states they will know when they are safe and ready for discharge when: "Because I know I have my dad and I'll be with him everyday and I know I won't drink around him" Does patient have access to transportation?: Yes Does patient have financial barriers related to discharge medications?: No Patient description of barriers related to discharge medications: None reported Will patient be returning to same living situation after discharge?:  Yes  Summary/Recommendations:   Summary and Recommendations (to be completed by the evaluator): Patient is a 53 year old male from [place], [place] [facility] due to substance use. According to H&P, pt has a  history of alcohol use disorder and alcohol withdrawal seizures that presented from [facility] after seizure-like activity shortly after completing standard benzodiazepine taper. Upon assessment, pt reports he drinks Vodka daily, at least 1/5. Pt reports he does not want to go to substance use treatment. Pt reports his step-mother died and his father had a heart attack, which caused him to relapse and drink. Pt states, "it's like lays chips, you can't just have one". Pt reports his aftercare prefrences are to see Allen Kell a therapist in [place], [place]. Patient's primary diagnoses are Alcohol Use Disorder and Substance Induced Mood Disorder. Recommendations include: crisis stabilization, therapeutic milieu, encourage group attendance and participation, medication management for mood stabilization and development of comprehensive mental wellness/sobriety plan.  Elza Rafter. 01/26/2024

## 2024-01-26 NOTE — Group Note (Signed)
 Date:  01/26/2024 Time:  10:08 PM  Group Topic/Focus:  AA    Participation Level:  Did Not Attend  Scot Dock 01/26/2024, 10:08 PM

## 2024-01-26 NOTE — Group Note (Signed)
 Recreation Therapy Group Note   Group Topic:Team Building  Group Date: 01/26/2024 Start Time: 0930 End Time: 1000 Facilitators: Davon Abdelaziz-McCall, LRT,CTRS Location: [address] Dayroom   Group Topic: Communication, Team Building, Problem Solving  Goal Area(s) Addresses:  Patient will effectively work with peer towards shared goal.  Patient will identify skills used to make activity successful.  Patient will identify how skills used during activity can be applied to reach post d/c goals.   Intervention: STEM Activity- Glass blower/designer  Activity: Tallest [employer]. In teams of 5-6, patients were given 11 craft pipe cleaners. Using the materials provided, patients were instructed to compete again the opposing team(s) to build the tallest free-standing structure from floor level. The activity was timed; difficulty increased by Clinical research associate as Production designer, theatre/television/film continued.  Systematically resources were removed with additional directions for example, placing one arm behind their back, working in silence, and shape stipulations. LRT facilitated post-activity discussion reviewing team processes and necessary communication skills involved in completion. Patients were encouraged to reflect how the skills utilized, or not utilized, in this activity can be incorporated to positively impact support systems post discharge.  Education: Pharmacist, community, Scientist, physiological, Discharge Planning   Education Outcome: Acknowledges education/In group clarification offered/Needs additional education.   Affect/Mood: N/A   Participation Level: Did not attend    Clinical Observations/Individualized Feedback:     Plan: Continue to engage patient in RT group sessions 2-3x/week.   Gawain Crombie-McCall, LRT,CTRS 01/26/2024 11:17 AM

## 2024-01-26 NOTE — Group Note (Signed)
 Date:  01/26/2024 Time:  1:27 PM  Group Topic/Focus:  Managing Feelings:   The focus of this group is to identify what feelings patients have difficulty handling and develop a plan to handle them in a healthier way upon discharge.    Participation Level:  Did Not Attend  Participation Quality:   n/a  Affect:   n/a  Cognitive:   n/a  Insight: None  Engagement in Group:   n/a  Modes of Intervention:   n/a  Additional Comments:   Did not attend  Stark Bray 01/26/2024, 1:27 PM

## 2024-01-26 NOTE — Group Note (Signed)
 Date:  01/26/2024 Time:  1:08 PM  Group Topic/Focus:  Managing Feelings:   The focus of this group is to identify what feelings patients have difficulty handling and develop a plan to handle them in a healthier way upon discharge. Primary and Secondary Emotions:   The focus of this group is to discuss the difference between primary and secondary emotions.    Participation Level:  Did Not Attend  Participation Quality:   n/a  Affect:   n/a  Cognitive:   n/a  Insight: None  Engagement in Group:   n/a  Modes of Intervention:   n/a  Additional Comments:   Did not attend  Stark Bray 01/26/2024, 1:08 PM

## 2024-01-26 NOTE — BH IP Treatment Plan (Signed)
 Interdisciplinary Treatment and Diagnostic Plan Update  01/26/2024 Time of Session: 11:05 AM Xavier Owens MRN: [id number]  Principal Diagnosis: <principal problem not specified>  Secondary Diagnoses: Active Problems:   MDD (major depressive disorder), recurrent episode, severe (HCC)   Current Medications:  Current Facility-Administered Medications  Medication Dose Route Frequency Provider Last Rate Last Admin   acetaminophen (TYLENOL) tablet 650 mg  650 mg Oral Q6H PRN Starkes-Perry, Juel Burrow, FNP       alum & mag hydroxide-simeth (MAALOX/MYLANTA) 200-200-20 MG/5ML suspension 30 mL  30 mL Oral Q4H PRN Starkes-Perry, Juel Burrow, FNP       chlordiazePOXIDE (LIBRIUM) capsule 25 mg  25 mg Oral Q6H PRN Maryagnes Amos, FNP   25 mg at 01/26/24 1256   haloperidol (HALDOL) tablet 5 mg  5 mg Oral TID PRN Maryagnes Amos, FNP       And   diphenhydrAMINE (BENADRYL) capsule 50 mg  50 mg Oral TID PRN Maryagnes Amos, FNP       DULoxetine (CYMBALTA) DR capsule 20 mg  20 mg Oral Daily Maryagnes Amos, FNP   20 mg at 01/26/24 0759   folic acid (FOLVITE) tablet 1 mg  1 mg Oral Daily Maryagnes Amos, FNP   1 mg at 01/26/24 0758   gabapentin (NEURONTIN) capsule 600 mg  600 mg Oral TID Maryagnes Amos, FNP   600 mg at 01/26/24 1256   hydrOXYzine (ATARAX) tablet 25 mg  25 mg Oral Q6H PRN Maryagnes Amos, FNP   25 mg at 01/25/24 2145   loperamide (IMODIUM) capsule 2-4 mg  2-4 mg Oral PRN Maryagnes Amos, FNP       magnesium hydroxide (MILK OF MAGNESIA) suspension 30 mL  30 mL Oral Daily PRN Starkes-Perry, Juel Burrow, FNP       multivitamin with minerals tablet 1 tablet  1 tablet Oral Daily Maryagnes Amos, FNP   1 tablet at 01/26/24 0758   propranolol (INDERAL) tablet 10 mg  10 mg Oral BID Maryagnes Amos, FNP   10 mg at 01/26/24 0759   QUEtiapine (SEROQUEL) tablet 100 mg  100 mg Oral QHS Maryagnes Amos, FNP   100 mg at 01/25/24 2112    rivaroxaban (XARELTO) tablet 10 mg  10 mg Oral Daily Maryagnes Amos, FNP   10 mg at 01/26/24 0759   thiamine (Vitamin B-1) tablet 100 mg  100 mg Oral Daily Maryagnes Amos, FNP   100 mg at 01/26/24 0800   Or   thiamine (VITAMIN B1) injection 100 mg  100 mg Intravenous Daily Starkes-Perry, Juel Burrow, FNP       traZODone (DESYREL) tablet 100 mg  100 mg Oral QHS Maryagnes Amos, FNP   100 mg at 01/25/24 2112   PTA Medications: Medications Prior to Admission  Medication Sig Dispense Refill Last Dose/Taking   chlordiazePOXIDE (LIBRIUM) 25 MG capsule 50mg  PO TID x 1D, then 25-50mg  PO BID X 1D, then 25-50mg  PO QD X 1D 10 capsule 0    DULoxetine (CYMBALTA) 20 MG capsule Take 1 capsule (20 mg total) by mouth daily.      folic acid (FOLVITE) 1 MG tablet Take 1 tablet (1 mg total) by mouth daily.      gabapentin (NEURONTIN) 300 MG capsule Take 2 capsules (600 mg total) by mouth 3 (three) times daily.      Multiple Vitamin (MULTIVITAMIN WITH MINERALS) TABS tablet Take 1 tablet by mouth daily.  propranolol (INDERAL) 10 MG tablet Take 1 tablet (10 mg total) by mouth 2 (two) times daily.      QUEtiapine (SEROQUEL) 100 MG tablet Take 1 tablet (100 mg total) by mouth at bedtime. 90 tablet 1    thiamine (VITAMIN B1) 100 MG tablet Take 100 mg by mouth daily.      traZODone (DESYREL) 100 MG tablet Take 100 mg by mouth at bedtime.       Patient Stressors:    Patient Strengths:    Treatment Modalities: Medication Management, Group therapy, Case management,  1 to 1 session with clinician, Psychoeducation, Recreational therapy.   Physician Treatment Plan for Primary Diagnosis: <principal problem not specified> Long Term Goal(s):     Short Term Goals:    Medication Management: Evaluate patient's response, side effects, and tolerance of medication regimen.  Therapeutic Interventions: 1 to 1 sessions, Unit Group sessions and Medication administration.  Evaluation of Outcomes: Not  Progressing  Physician Treatment Plan for Secondary Diagnosis: Active Problems:   MDD (major depressive disorder), recurrent episode, severe (HCC)  Long Term Goal(s):     Short Term Goals:       Medication Management: Evaluate patient's response, side effects, and tolerance of medication regimen.  Therapeutic Interventions: 1 to 1 sessions, Unit Group sessions and Medication administration.  Evaluation of Outcomes: Not Progressing   RN Treatment Plan for Primary Diagnosis: <principal problem not specified> Long Term Goal(s): Knowledge of disease and therapeutic regimen to maintain health will improve  Short Term Goals: Ability to remain free from injury will improve, Ability to verbalize frustration and anger appropriately will improve, Ability to demonstrate self-control, Ability to participate in decision making will improve, Ability to verbalize feelings will improve, Ability to disclose and discuss suicidal ideas, Ability to identify and develop effective coping behaviors will improve, and Compliance with prescribed medications will improve  Medication Management: RN will administer medications as ordered by provider, will assess and evaluate patient's response and provide education to patient for prescribed medication. RN will report any adverse and/or side effects to prescribing provider.  Therapeutic Interventions: 1 on 1 counseling sessions, Psychoeducation, Medication administration, Evaluate responses to treatment, Monitor vital signs and CBGs as ordered, Perform/monitor CIWA, COWS, AIMS and Fall Risk screenings as ordered, Perform wound care treatments as ordered.  Evaluation of Outcomes: Not Progressing   LCSW Treatment Plan for Primary Diagnosis: <principal problem not specified> Long Term Goal(s): Safe transition to appropriate next level of care at discharge, Engage patient in therapeutic group addressing interpersonal concerns.  Short Term Goals: Engage patient in  aftercare planning with referrals and resources, Increase social support, Increase ability to appropriately verbalize feelings, Increase emotional regulation, Facilitate acceptance of mental health diagnosis and concerns, Facilitate patient progression through stages of change regarding substance use diagnoses and concerns, Identify triggers associated with mental health/substance abuse issues, and Increase skills for wellness and recovery  Therapeutic Interventions: Assess for all discharge needs, 1 to 1 time with Social worker, Explore available resources and support systems, Assess for adequacy in community support network, Educate family and significant other(s) on suicide prevention, Complete Psychosocial Assessment, Interpersonal group therapy.  Evaluation of Outcomes: Not Progressing   Progress in Treatment: Attending groups: No. Participating in groups: No. Taking medication as prescribed: Yes. Toleration medication: Yes. Family/Significant other contact made:  Patient declined consents Patient understands diagnosis: Yes. Discussing patient identified problems/goals with staff: Yes. Medical problems stabilized or resolved: Yes. Denies suicidal/homicidal ideation: Yes. Issues/concerns per patient self-inventory: No.  New problem(s) identified:  No  New Short Term/Long Term Goal(s):  detox, medication management for mood stabilization; elimination of SI thoughts; development of comprehensive mental wellness/sobriety plan  Patient Goals:  "get my alcohol use under control."   Discharge Plan or Barriers:  Patient recently admitted. CSW will continue to follow and assess for appropriate referrals and possible discharge planning.   Reason for Continuation of Hospitalization: Medication stabilization Withdrawals from alcohol  Estimated Length of Stay:  5 - 7 days  Last 3 [place] Suicide Severity Risk Score: Flowsheet Row [facility]Current[facility] (Discharged) from 01/23/2024 in [facility] from [facility]/18/2024 in [facility]  C-SSRS RISK CATEGORY No Risk No Risk No Risk       Last PHQ 2/9 Scores:    01/23/2024   10:12 AM 01/18/2024   12:26 AM 06/10/2022    9:19 AM  Depression screen PHQ 2/9  Decreased Interest 0 0 0  Down, Depressed, Hopeless 0 3 0  PHQ - 2 Score 0 3 0  Altered sleeping 1 3   Tired, decreased energy 1 0   Change in appetite 0 3   Feeling bad or failure about yourself  0 3   Trouble concentrating 1 3   Moving slowly or fidgety/restless 0 0   Suicidal thoughts 0 0   PHQ-9 Score 3 15   Difficult doing work/chores  Extremely dIfficult     Scribe for Treatment Team: Alla Feeling, LCSWA 01/26/2024 1:34 PM

## 2024-01-26 NOTE — Plan of Care (Signed)
  Problem: Education: Goal: Knowledge of [place] [facility] Education information/materials will improve Outcome: Completed/Met Goal: Emotional status will improve Outcome: Progressing Goal: Mental status will improve Outcome: Progressing Goal: Verbalization of understanding the information provided will improve Outcome: Progressing   Problem: Activity: Goal: Interest or engagement in activities will improve Outcome: Progressing Goal: Sleeping patterns will improve Outcome: Progressing

## 2024-01-26 NOTE — H&P (Addendum)
 Psychiatric Admission Assessment Adult  Patient Identification: Xavier Owens MRN:  [id number] Date of Evaluation:  01/26/2024 Chief Complaint: I want to maintain sobriety. Principal Diagnosis: <principal problem not specified> Diagnosis:  Active Problems:   MDD (major depressive disorder), recurrent episode, severe (HCC)  History of Present Illness:  53 year old African-American male, veteran, runs his own business, his fiance lives with him.  Background history of alcohol use disorder.  Self-presented to the emergency room for detox.  History of alcohol withdrawal seizures and was managed medically before being transferred to our unit.  Chart reviewed today.  Patient discussed at multidisciplinary team meeting.  Nursing staff reports that patient has been adherent with treatment.  No challenging behavior on the unit.  At interview with patient, he reports a long history of alcohol use.  States that he was drinking on a daily basis.  He never stopped drinking because he will go into withdrawals.  States that he has had multiple DUIs.  He has had seizures twice from alcohol withdrawals.  States that he came to the hospital on own accord so as to come off alcohol and stay sober.  Patient reports very good relationship with his fiance.  They have been together for 15 years.  Patient's daughter is training to become a gynecologist.  They have a good good relationship.  States that his daughter influenced his decision to become sober.  Patient has not made attempts to sober up in the past.  He tells me that he feels decent.  He does not feel down in the dumps.  He still has some anxiety related to alcohol withdrawal.  He is still experiencing tremors.  He is not endorsing any tactile hallucination.  No visual hallucination.  No auditory hallucinations.  Patient is not endorsing any other form of abnormal perception.  He is not expressing any delusion.  Patient is not endorsing any psychosocial  stressors.  No legal issues.  No major financial issues.  No relational issues.  There are no manic symptoms.  No psychotic symptoms.  No evidence of PTSD.  No evidence of OCD.  No use of any other psychoactive substance. Review of symptoms is essentially as above  Total Time spent with patient: 1 hour  Past Psychiatric History:  Long history of alcohol use disorder.  No other mental health diagnosis. No past trial of any psychotropic medication. No past inpatient care. No use of any other psychoactive substances other than alcohol. Patient has been in rehab once. No past history of mania.  No past history of psychosis.  No past history of self-injurious behavior.  No past suicidal attempts.  No past history of violent behavior. No past history of neuromodulation.  [place] Scale:  Flowsheet Row Admission (Current) [facility]Ad[facility] (Discharged) from 01/23/2024 in [facility] from 01/18/2024 in [facility]  C-SSRS RISK CATEGORY No Risk No Risk No Risk       Alcohol Screening: Patient refused Alcohol Screening Tool: Yes 1. How often do you have a drink containing alcohol?: Never 2. How many drinks containing alcohol do you have on a typical day when you are drinking?: 1 or 2 3. How often do you have six or more drinks on one occasion?: Never AUDIT-C Score: 0 4. How often during the last year have you found that you were not able to stop drinking once you had started?: Never 5. How often during the last  year have you failed to do what was normally expected from you because of drinking?: Never 6. How often during the last year have you needed a first drink in the morning to get yourself going after a heavy drinking session?: Never 7. How often during the last year have you had a feeling of guilt of remorse after drinking?: Never 8. How often during the last year have you  been unable to remember what happened the night before because you had been drinking?: Never 9. Have you or someone else been injured as a result of your drinking?: No 10. Has a relative or friend or a doctor or another health worker been concerned about your drinking or suggested you cut down?: No Alcohol Use Disorder Identification Test Final Score (AUDIT): 0 Alcohol Brief Interventions/Follow-up: Alcohol education/Brief advice Past Medical History:  Past Medical History:  Diagnosis Date   Alcoholic (HCC)    Hypertension    Seizures (HCC)     Past Surgical History:  Procedure Laterality Date   dislocated hip     HAND SURGERY     KNEE SURGERY     Family History:  Family History  Problem Relation Age of Onset   Cancer Mother    Cancer Father    Family Psychiatric  History:  None. Tobacco Screening:  Social History   Tobacco Use  Smoking Status Former   Current packs/day: 0.10   Types: Cigarettes  Smokeless Tobacco Never    BH Tobacco Counseling     Are you interested in Tobacco Cessation Medications?  No, patient refused Counseled patient on smoking cessation:  Refused/Declined practical counseling Reason Tobacco Screening Not Completed: No value filed.       Social History:  Social History   Substance and Sexual Activity  Alcohol Use Yes   Comment: "a fifth of vodka every day for 30 years"     Social History   Substance and Sexual Activity  Drug Use No    Additional Social History: Marital status: Single Are you sexually active?: No What is your sexual orientation?: "heterosexual" Has your sexual activity been affected by drugs, alcohol, medication, or emotional stress?: "no" Does patient have children?: Yes How many children?: 2 How is patient's relationship with their children?: "great" Pt reports one of his daughters he did not find out about until later                         Allergies:  No Known Allergies Lab Results:  Results for  orders placed or performed during the hospital encounter of 01/23/24 (from the past 48 hours)  CBC     Status: Abnormal   Collection Time: 01/25/24  7:08 AM  Result Value Ref Range   WBC 4.6 4.0 - 10.5 K/uL   RBC 3.72 (L) 4.22 - 5.81 MIL/uL   Hemoglobin 11.7 (L) 13.0 - 17.0 g/dL   HCT 16.1 (L) 09.6 - 04.5 %   MCV 94.4 80.0 - 100.0 fL   MCH 31.5 26.0 - 34.0 pg   MCHC 33.3 30.0 - 36.0 g/dL   RDW 40.9 81.1 - 91.4 %   Platelets 292 150 - 400 K/uL   nRBC 0.0 0.0 - 0.2 %    Comment: Performed at [facility], [address], [place], [place] [address]  Basic metabolic panel     Status: None   Collection Time: 01/25/24  7:08 AM  Result Value Ref Range   Sodium 138 135 -  145 mmol/L   Potassium 4.3 3.5 - 5.1 mmol/L   Chloride 104 98 - 111 mmol/L   CO2 24 22 - 32 mmol/L   Glucose, Bld 93 70 - 99 mg/dL    Comment: Glucose reference range applies only to samples taken after fasting for at least 8 hours.   BUN 8 6 - 20 mg/dL   Creatinine, Ser 8.29 0.61 - 1.24 mg/dL   Calcium 9.2 8.9 - 56.2 mg/dL   GFR, Estimated >13 >08 mL/min    Comment: (NOTE) Calculated using the CKD-EPI Creatinine Equation (2021)    Anion gap 10 5 - 15    Comment: Performed at [facility], [address], [place], [place] [address]    Blood Alcohol level:  Lab Results  Component Value Date   [facility] <10 01/23/2024   ETH <10 01/18/2024    Metabolic Disorder Labs:  Lab Results  Component Value Date   HGBA1C 5.0 12/09/2021   MPG 97 12/09/2021   No results found for: "PROLACTIN" Lab Results  Component Value Date   CHOL 179 12/09/2021   TRIG 90 12/09/2021   HDL 44 12/09/2021   CHOLHDL 4.1 12/09/2021   VLDL 18 12/09/2021   LDLCALC 117 (H) 12/09/2021    Current Medications: Current Facility-Administered Medications  Medication Dose Route Frequency Provider Last Rate Last Admin   acetaminophen (TYLENOL) tablet 650 mg  650 mg Oral Q6H PRN Maryagnes Amos, FNP       alum & mag  hydroxide-simeth (MAALOX/MYLANTA) 200-200-20 MG/5ML suspension 30 mL  30 mL Oral Q4H PRN Starkes-Perry, Juel Burrow, FNP       chlordiazePOXIDE (LIBRIUM) capsule 25 mg  25 mg Oral Q6H PRN Maryagnes Amos, FNP   25 mg at 01/26/24 1256   haloperidol (HALDOL) tablet 5 mg  5 mg Oral TID PRN Maryagnes Amos, FNP       And   diphenhydrAMINE (BENADRYL) capsule 50 mg  50 mg Oral TID PRN Maryagnes Amos, FNP       DULoxetine (CYMBALTA) DR capsule 20 mg  20 mg Oral Daily Maryagnes Amos, FNP   20 mg at 01/26/24 0759   folic acid (FOLVITE) tablet 1 mg  1 mg Oral Daily Maryagnes Amos, FNP   1 mg at 01/26/24 0758   gabapentin (NEURONTIN) capsule 600 mg  600 mg Oral TID Maryagnes Amos, FNP   600 mg at 01/26/24 1256   hydrOXYzine (ATARAX) tablet 25 mg  25 mg Oral Q6H PRN Maryagnes Amos, FNP   25 mg at 01/25/24 2145   loperamide (IMODIUM) capsule 2-4 mg  2-4 mg Oral PRN Maryagnes Amos, FNP       magnesium hydroxide (MILK OF MAGNESIA) suspension 30 mL  30 mL Oral Daily PRN Starkes-Perry, Juel Burrow, FNP       multivitamin with minerals tablet 1 tablet  1 tablet Oral Daily Maryagnes Amos, FNP   1 tablet at 01/26/24 0758   propranolol (INDERAL) tablet 10 mg  10 mg Oral BID Maryagnes Amos, FNP   10 mg at 01/26/24 0759   QUEtiapine (SEROQUEL) tablet 100 mg  100 mg Oral QHS Maryagnes Amos, FNP   100 mg at 01/25/24 2112   rivaroxaban (XARELTO) tablet 10 mg  10 mg Oral Daily Maryagnes Amos, FNP   10 mg at 01/26/24 0759   thiamine (Vitamin B-1) tablet 100 mg  100 mg Oral Daily Maryagnes Amos, FNP  100 mg at 01/26/24 0800   Or   thiamine (VITAMIN B1) injection 100 mg  100 mg Intravenous Daily Starkes-Perry, Juel Burrow, FNP       traZODone (DESYREL) tablet 100 mg  100 mg Oral QHS Maryagnes Amos, FNP   100 mg at 01/25/24 2112   PTA Medications: Medications Prior to Admission  Medication Sig Dispense Refill Last Dose/Taking    chlordiazePOXIDE (LIBRIUM) 25 MG capsule 50mg  PO TID x 1D, then 25-50mg  PO BID X 1D, then 25-50mg  PO QD X 1D 10 capsule 0    DULoxetine (CYMBALTA) 20 MG capsule Take 1 capsule (20 mg total) by mouth daily.      folic acid (FOLVITE) 1 MG tablet Take 1 tablet (1 mg total) by mouth daily.      gabapentin (NEURONTIN) 300 MG capsule Take 2 capsules (600 mg total) by mouth 3 (three) times daily.      Multiple Vitamin (MULTIVITAMIN WITH MINERALS) TABS tablet Take 1 tablet by mouth daily.      propranolol (INDERAL) 10 MG tablet Take 1 tablet (10 mg total) by mouth 2 (two) times daily.      QUEtiapine (SEROQUEL) 100 MG tablet Take 1 tablet (100 mg total) by mouth at bedtime. 90 tablet 1    thiamine (VITAMIN B1) 100 MG tablet Take 100 mg by mouth daily.      traZODone (DESYREL) 100 MG tablet Take 100 mg by mouth at bedtime.       Musculoskeletal: Strength & Muscle Tone: within normal limits Gait & Station: ataxic, broad based Patient leans: N/A  Psychiatric Specialty Exam:  Presentation  General Appearance:  Casually dressed, tremulous, not sweaty, normal conjugate eye movement.   Eye Contact: Good eye contact. Speech: Spontaneous.  Soft spoken.     Mood and Affect  Mood: Less anxious.   Affect: Restricted and appropriate.   Thought Process  Thought Processes: Goal Directed; Coherent   Descriptions of Associations:Intact   Orientation:Full (Time, Place and Person)   Thought Content:  No suicidal thoughts.  No homicidal thoughts.  No thoughts of violence.  No delusional theme.  No negative ruminations.  No guilty ruminations.  No obsessions.   Hallucinations: No hallucination in any modality.   Sensorium  Memory: Good.   Judgment: Good.   Insight: Good.   Executive Functions  Concentration: Good.   Attention Span: Good.   Recall: Good.   Fund of Knowledge: Good.   Language: Good.   Psychomotor Activity  Normal psychomotor activity.  Physical  Exam: Physical Exam Constitutional:      Appearance: Normal appearance.  HENT:     Head: Normocephalic and atraumatic.     Nose: Nose normal.     Mouth/Throat:     Mouth: Mucous membranes are moist.  Eyes:     Extraocular Movements: Extraocular movements intact.     Pupils: Pupils are equal, round, and reactive to light.  Cardiovascular:     Rate and Rhythm: Normal rate.  Pulmonary:     Effort: Pulmonary effort is normal.  Musculoskeletal:        General: Normal range of motion.     Cervical back: Normal range of motion and neck supple.  Skin:    General: Skin is warm.  Neurological:     Mental Status: He is alert and oriented to person, place, and time.    Review of Systems  Constitutional: Negative.   HENT:  Positive for hearing loss.   Eyes: Negative.   Respiratory:  Negative.    Cardiovascular: Negative.   Gastrointestinal: Negative.   Genitourinary: Negative.   Musculoskeletal: Negative.   Skin: Negative.   Neurological: Negative.   Endo/Heme/Allergies: Negative.   Psychiatric/Behavioral:  Positive for substance abuse.    Blood pressure 106/79, pulse 78, temperature 97.7 F (36.5 C), temperature source Oral, resp. rate 19, height 5\' 8"  (1.727 m), weight 79.4 kg, SpO2 95%. Body mass index is 26.61 kg/m.  Treatment Plan Summary: Patient is detoxing from alcohol smoothly.  He still has some residual tremors.  There is no evidence of depression.  There is no evidence of mania.  There is no evidence of psychosis.  No thoughts of deliberate self-harm or harming other people. We will continue alcohol withdrawal protocol, gather collateral from his family and evaluate him further.  Observation Level/Precautions:  Detox 15 minute checks Seizure  Laboratory: No acute labs needed.  Psychotherapy: Patient will attend unit groups and therapeutic activities.  Medications:   1.  CIWA protocol with Librium taper. 2.  Continue duloxetine 20 mg daily. 3.  Continue quetiapine  100 mg at bedtime. 4.  Continue trazodone 100 mg at bedtime. 5.  Continue gabapentin 600 mg 3 times daily.  Consultations: None indicated at this time.  Discharge Concerns: None.  Estimated LOS: 3 to 5 days.  Other:     Physician Treatment Plan for Primary Diagnosis: <principal problem not specified> Long Term Goal(s): Improvement in symptoms so as ready for discharge  Short Term Goals:  Maintenance of sobriety  Physician Treatment Plan for Secondary Diagnosis: Active Problems:   MDD (major depressive disorder), recurrent episode, severe (HCC)  Long Term Goal(s): Improvement in symptoms so as ready for discharge  Short Term Goals:  Maintenance of sobriety  I certify that inpatient services furnished can reasonably be expected to improve the patient's condition.    Georgiann Cocker, MD 3/14/20252:40 PM

## 2024-01-26 NOTE — Progress Notes (Signed)
   01/25/24 2200  Psych Admission Type (Psych Patients Only)  Admission Status Voluntary  Psychosocial Assessment  Patient Complaints Nervousness;Substance abuse  Eye Contact Fair  Facial Expression Animated  Affect Anxious;Appropriate to circumstance  Speech Logical/coherent  Interaction Assertive  Motor Activity Slow;Unsteady;Tremors  Appearance/Hygiene Unremarkable  Behavior Characteristics Cooperative;Appropriate to situation  Mood Pleasant  Thought Process  Coherency WDL  Content WDL  Delusions None reported or observed  Perception WDL  Hallucination None reported or observed  Judgment Poor  Confusion None  Danger to Self  Current suicidal ideation? Denies  Description of Suicide Plan none  Agreement Not to Harm Self Yes  Description of Agreement verbal  Danger to Others  Danger to Others None reported or observed

## 2024-01-26 NOTE — Plan of Care (Signed)

## 2024-01-26 NOTE — [facility] Suicide Risk Assessment (Signed)
 [facility] Admission Suicide Risk Assessment   Nursing information obtained from:  Patient Demographic factors:  Male Current Mental Status:  Thoughts of violence towards others Loss Factors:  NA Historical Factors:  NA Risk Reduction Factors:  Positive social support  Total Time spent with patient: 20 minutes. Principal Problem: Alcohol withdrawal syndrome Diagnosis:  Active Problems:   MDD (major depressive disorder), recurrent episode, severe (HCC)  Subjective Data:  53 year old African-American male, veteran, self-employed, lives with her fianc.  Background history of alcohol use disorder complicated with withdrawal seizures.  Voluntarily presented for detox.  Patient has been drinking on a daily basis.    Continued Clinical Symptoms:  Alcohol Use Disorder Identification Test Final Score (AUDIT): 0 The "Alcohol Use Disorders Identification Test", Guidelines for Use in Primary Care, Second Edition.  World Science writer [facility]). Score between 0-7:  no or low risk or alcohol related problems. Score between 8-15:  moderate risk of alcohol related problems. Score between 16-19:  high risk of alcohol related problems. Score 20 or above:  warrants further diagnostic evaluation for alcohol dependence and treatment.   CLINICAL FACTORS:   Alcohol/Substance Abuse/Dependencies   Musculoskeletal: Strength & Muscle Tone: within normal limits Gait & Station: ataxic, broad based Patient leans: N/A  Psychiatric Specialty Exam:  Presentation  General Appearance:  Casually dressed, tremulous, not sweaty, normal conjugate eye movement.  Eye Contact: Good eye contact. Speech: Spontaneous.  Soft spoken.    Mood and Affect  Mood: Less anxious.  Affect: Restricted and appropriate.  Thought Process  Thought Processes: Goal Directed; Coherent  Descriptions of Associations:Intact  Orientation:Full (Time, Place and Person)  Thought Content:  No suicidal thoughts.  No homicidal  thoughts.  No thoughts of violence.  No delusional theme.  No negative ruminations.  No guilty ruminations.  No obsessions.  Hallucinations: No hallucination in any modality.  Sensorium  Memory: Good.  Judgment: Good.  Insight: Good.  Executive Functions  Concentration: Good.  Attention Span: Good.  Recall: Good.  Fund of Knowledge: Good.  Language: Good.  Psychomotor Activity  Normal psychomotor activity.  Physical Exam: Physical Exam ROS Blood pressure 106/79, pulse 78, temperature 97.7 F (36.5 C), temperature source Oral, resp. rate 19, height 5\' 8"  (1.727 m), weight 79.4 kg, SpO2 95%. Body mass index is 26.61 kg/m.   COGNITIVE FEATURES THAT CONTRIBUTE TO RISK:  None    SUICIDE RISK:   Minimal: No identifiable suicidal ideation.  Patients presenting with no risk factors but with morbid ruminations; may be classified as minimal risk based on the severity of the depressive symptoms  PLAN OF CARE:  We will continue CIWA protocol with Librium taper.  Patient is on every 15 minute check.  I certify that inpatient services furnished can reasonably be expected to improve the patient's condition.   Georgiann Cocker, MD 01/26/2024, 1:18 PM

## 2024-01-26 NOTE — Group Note (Signed)
 Date:  01/26/2024 Time:  10:33 AM  Group Topic/Focus:  Goals Group:   The focus of this group is to help patients establish daily goals to achieve during treatment and discuss how the patient can incorporate goal setting into their daily lives to aide in recovery. Orientation:   The focus of this group is to educate the patient on the purpose and policies of crisis stabilization and provide a format to answer questions about their admission.  The group details unit policies and expectations of patients while admitted.    Participation Level:  Did Not Attend  Participation Quality:   n/a  Affect:   n/a  Cognitive:   n/a  Insight: None  Engagement in Group:   n/a  Modes of Intervention:   n/a  Additional Comments:   Did not attend  Stark Bray 01/26/2024, 10:33 AM

## 2024-01-26 NOTE — Progress Notes (Signed)
   01/26/24 0830  Psych Admission Type (Psych Patients Only)  Admission Status Voluntary  Psychosocial Assessment  Patient Complaints Substance abuse  Eye Contact Fair  Facial Expression Animated  Affect Anxious;Appropriate to circumstance  Speech Logical/coherent  Interaction Assertive  Motor Activity Slow;Tremors  Appearance/Hygiene Unremarkable  Behavior Characteristics Appropriate to situation;Unable to participate  Mood Pleasant  Thought Process  Coherency WDL  Content WDL  Delusions None reported or observed  Perception WDL  Hallucination None reported or observed  Judgment Poor  Confusion None  Danger to Self  Current suicidal ideation? Denies  Agreement Not to Harm Self Yes  Description of Agreement verbak  Danger to Others  Danger to Others None reported or observed

## 2024-01-26 NOTE — Progress Notes (Signed)
 Pt visible in the milieu.  Interacting appropriately with staff and peers.  Pt attending evening group.  Reported he was tired earlier in the day.  Pt stated regarding his drinking that he knows he needs help.  Needs assessed.  Pt denied.  Denied SI, HI and AVH.  Safety checks continue for patient safety.  Pt safe on unit.

## 2024-01-27 DIAGNOSIS — F332 Major depressive disorder, recurrent severe without psychotic features: Secondary | ICD-10-CM | POA: Diagnosis not present

## 2024-01-27 MED ORDER — DIPHENHYDRAMINE HCL 50 MG/ML IJ SOLN: 50.0000 mg | Freq: Three times a day (TID) | INTRAMUSCULAR | Status: DC | PRN

## 2024-01-27 MED ORDER — LOPERAMIDE HCL 2 MG PO CAPS: 2.0000 mg | ORAL_CAPSULE | ORAL | Status: AC | PRN

## 2024-01-27 MED ORDER — HALOPERIDOL LACTATE 5 MG/ML IJ SOLN: 5.0000 mg | Freq: Three times a day (TID) | INTRAMUSCULAR | Status: DC | PRN

## 2024-01-27 MED ORDER — HALOPERIDOL LACTATE 5 MG/ML IJ SOLN: 10.0000 mg | Freq: Three times a day (TID) | INTRAMUSCULAR | Status: DC | PRN

## 2024-01-27 MED ORDER — LORAZEPAM 2 MG/ML IJ SOLN: 2.0000 mg | Freq: Three times a day (TID) | INTRAMUSCULAR | Status: DC | PRN

## 2024-01-27 MED ORDER — LORAZEPAM 1 MG PO TABS
1.0000 mg | ORAL_TABLET | Freq: Four times a day (QID) | ORAL | Status: DC | PRN
  Administered 2024-01-27 – 2024-01-28 (×2): 1 mg via ORAL
  Filled 2024-01-27 (×2): qty 1

## 2024-01-27 MED ORDER — ONDANSETRON 4 MG PO TBDP: 4.0000 mg | ORAL_TABLET | Freq: Four times a day (QID) | ORAL | Status: AC | PRN

## 2024-01-27 NOTE — [facility] Group Notes (Signed)
 [facility] Group Notes:  (Nursing/MHT/Case Management/Adjunct)  Date:  01/27/2024  Time:  2000  Type of Therapy:   Wrap up group  Participation Level:  Active  Participation Quality:  Attentive and Sharing  Affect:  Depressed and Flat  Cognitive:  Alert  Insight:  Lacking  Engagement in Group:  Developing/Improving  Modes of Intervention:  Clarification, Education, Socialization, and Support  Summary of Progress/Problems: Positive thinking and positive change were discussed.   Marcille Buffy 01/27/2024, 9:13 PM

## 2024-01-27 NOTE — Progress Notes (Addendum)
 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 Background history of alcohol use disorder. Self-presented to the emergency room for detox. History of alcohol withdrawal seizures and was managed medically before being transferred to our unit.  Chart reviewed today.  Patient discussed at multidisciplinary team meeting.  Nursing staff reports that patient is still exhibiting features of withdrawals.  He required Ativan this morning. He slept well last night.  He has been adherent with his medications.  Seen today.  Patient feels better after getting Ativan today.  States that tremors are less intense.  He got to have this continued for some time.  Not endorsing any tactile hallucination.  No auditory or visual hallucination.  He is not endorsing any delusion.  Patient states that he has been tolerating his meals without any nausea or vomiting.  The  Patient's fianc is coming to visit tonight.  He remains future oriented.  No suicidal thoughts.  He remains committed to maintaining sobriety. Encouraged.   Principal Problem: <principal problem not specified> Diagnosis: Active Problems:   MDD (major depressive disorder), recurrent episode, severe (HCC)  Total Time spent with patient: 30 minutes  Past Psychiatric History:  See H&P.  Past Medical History:  Past Medical History:  Diagnosis Date   Alcoholic (HCC)    Hypertension    Seizures (HCC)     Past Surgical History:  Procedure Laterality Date   dislocated hip     HAND SURGERY     KNEE SURGERY     Family History:  Family History  Problem Relation Age of Onset   Cancer Mother    Cancer Father    Family Psychiatric  History:  See H&P.  Social History:  Social History   Substance and Sexual Activity  Alcohol Use Yes   Comment: "a fifth of vodka every day for 30 years"     Social  History   Substance and Sexual Activity  Drug Use No    Social History   Socioeconomic History   Marital status: Married    Spouse name: Not on file   Number of children: Not on file   Years of education: Not on file   Highest education level: Not on file  Occupational History   Not on file  Tobacco Use   Smoking status: Former    Current packs/day: 0.10    Types: Cigarettes   Smokeless tobacco: Never  Vaping Use   Vaping status: Never Used  Substance and Sexual Activity   Alcohol use: Yes    Comment: "a fifth of vodka every day for 30 years"   Drug use: No   Sexual activity: Yes  Other Topics Concern   Not on file  Social History Narrative   Not on file   Social Drivers of Health   Financial Resource Strain: Not on file  Food Insecurity: No Food Insecurity (01/25/2024)   Hunger Vital Sign    Worried About Running Out of Food in the Last Year: Never true    Ran Out of Food in the Last Year: Never true  Transportation Needs: No Transportation Needs (01/25/2024)   PRAPARE - Administrator, Civil Service (Medical): No    Lack of Transportation (Non-Medical): No  Physical Activity: Not on file  Stress: Not on file  Social Connections: Socially Integrated (01/24/2024)   Social Connection and Isolation Panel [NHANES]  Frequency of Communication with Friends and Family: More than three times a week    Frequency of Social Gatherings with Friends and Family: More than three times a week    Attends Religious Services: More than 4 times per year    Active Member of [employer] or Organizations: Yes    Attends Engineer, structural: More than 4 times per year    Marital Status: Living with partner   Additional Social History:    Current Medications: Current Facility-Administered Medications  Medication Dose Route Frequency Provider Last Rate Last Admin   acetaminophen (TYLENOL) tablet 650 mg  650 mg Oral Q6H PRN Starkes-Perry, Juel Burrow, FNP       alum & mag  hydroxide-simeth (MAALOX/MYLANTA) 200-200-20 MG/5ML suspension 30 mL  30 mL Oral Q4H PRN Starkes-Perry, Juel Burrow, FNP       haloperidol (HALDOL) tablet 5 mg  5 mg Oral TID PRN Starkes-Perry, Juel Burrow, FNP       And   diphenhydrAMINE (BENADRYL) capsule 50 mg  50 mg Oral TID PRN Starkes-Perry, Juel Burrow, FNP       haloperidol lactate (HALDOL) injection 5 mg  5 mg Intramuscular TID PRN Massengill, Harrold Donath, MD       And   diphenhydrAMINE (BENADRYL) injection 50 mg  50 mg Intramuscular TID PRN Massengill, Harrold Donath, MD       And   LORazepam (ATIVAN) injection 2 mg  2 mg Intramuscular TID PRN Massengill, Harrold Donath, MD       haloperidol lactate (HALDOL) injection 10 mg  10 mg Intramuscular TID PRN Massengill, Harrold Donath, MD       And   diphenhydrAMINE (BENADRYL) injection 50 mg  50 mg Intramuscular TID PRN Massengill, Harrold Donath, MD       And   LORazepam (ATIVAN) injection 2 mg  2 mg Intramuscular TID PRN Massengill, Harrold Donath, MD       DULoxetine (CYMBALTA) DR capsule 20 mg  20 mg Oral Daily Maryagnes Amos, FNP   20 mg at 01/27/24 0735   folic acid (FOLVITE) tablet 1 mg  1 mg Oral Daily Maryagnes Amos, FNP   1 mg at 01/27/24 0735   gabapentin (NEURONTIN) capsule 600 mg  600 mg Oral TID Maryagnes Amos, FNP   600 mg at 01/27/24 1205   loperamide (IMODIUM) capsule 2-4 mg  2-4 mg Oral PRN Massengill, Harrold Donath, MD       LORazepam (ATIVAN) tablet 1 mg  1 mg Oral Q6H PRN Massengill, Harrold Donath, MD   1 mg at 01/27/24 0902   magnesium hydroxide (MILK OF MAGNESIA) suspension 30 mL  30 mL Oral Daily PRN Maryagnes Amos, FNP       multivitamin with minerals tablet 1 tablet  1 tablet Oral Daily Maryagnes Amos, FNP   1 tablet at 01/27/24 0735   ondansetron (ZOFRAN-ODT) disintegrating tablet 4 mg  4 mg Oral Q6H PRN Massengill, Harrold Donath, MD       propranolol (INDERAL) tablet 10 mg  10 mg Oral BID Maryagnes Amos, FNP   10 mg at 01/27/24 0735   QUEtiapine (SEROQUEL) tablet 100 mg  100 mg Oral QHS  Maryagnes Amos, FNP   100 mg at 01/26/24 2143   rivaroxaban (XARELTO) tablet 10 mg  10 mg Oral Daily Maryagnes Amos, FNP   10 mg at 01/27/24 0736   thiamine (Vitamin B-1) tablet 100 mg  100 mg Oral Daily Maryagnes Amos, FNP   100 mg at 01/27/24 [phone]  Or   thiamine (VITAMIN B1) injection 100 mg  100 mg Intravenous Daily Starkes-Perry, Juel Burrow, FNP       traZODone (DESYREL) tablet 100 mg  100 mg Oral QHS Maryagnes Amos, FNP   100 mg at 01/26/24 2143    Lab Results: No results found for this or any previous visit (from the past 48 hours).  Blood Alcohol level:  Lab Results  Component Value Date   ETH <10 01/23/2024   ETH <10 01/18/2024    Metabolic Disorder Labs: Lab Results  Component Value Date   HGBA1C 5.0 12/09/2021   MPG 97 12/09/2021   No results found for: "PROLACTIN" Lab Results  Component Value Date   CHOL 179 12/09/2021   TRIG 90 12/09/2021   HDL 44 12/09/2021   CHOLHDL 4.1 12/09/2021   VLDL 18 12/09/2021   LDLCALC 117 (H) 12/09/2021    Physical Findings: AIMS:  , ,  ,  ,    CIWA:  CIWA-Ar Total: 6 COWS:     Musculoskeletal: Strength & Muscle Tone: within normal limits Gait & Station: broad based Patient leans: N/A  Psychiatric Specialty Exam:  Presentation  General Appearance:  Casually dressed, not in any distress, not sweaty, less tremulous.  Eye Contact: Good eye contact.  Speech: Spontaneous.  Normalizing rate, tone and volume.   Mood and Affect  Mood: Subjectively and objectively better.   Affect: Restricted and appropriate.   Thought Process  Thought Processes: Linear and goal directed.   Descriptions of Associations:Intact   Orientation:Full (Time, Place and Person)   Thought Content:  No suicidal thoughts.  No homicidal thoughts.  No thoughts of violence.  No delusional theme.  No negative ruminations.  No guilty ruminations.  No obsessions.   Hallucinations: No hallucination in any modality.    Sensorium  Memory: Good.   Judgment: Good.   Insight: Good.   Executive Functions  Concentration: Good.   Attention Span: Good.   Recall: Good.   Fund of Knowledge: Good.   Language: Good.   Psychomotor Activity  Normal psychomotor activity.   Physical Exam: Physical Exam ROS Blood pressure 107/65, pulse 82, temperature 98.9 F (37.2 C), temperature source Oral, resp. rate 19, height 5\' 8"  (1.727 m), weight 79.4 kg, SpO2 99%. Body mass index is 26.61 kg/m.   Treatment Plan Summary: Patient is gradually coming off alcohol.  He still requires benzodiazepines.  No psychotic component.  No dangerousness.  We will make adjustments as below and evaluate him further.  1.  Continue CIWA protocol with Ativan. 2.  Continue duloxetine 20 mg daily. 3.  Continue quetiapine 100 mg at bedtime. 4.  Continue trazodone 100 mg at bedtime. 5.  Continue gabapentin 600 mg 3 times daily. 6.  Continue to monitor mood behavior and interaction with others. 7.  Continue to encourage unit groups and therapeutic activities. 8.  Social worker will coordinate discharge and aftercare planning.   Georgiann Cocker, MD 01/27/2024, 1:32 PM

## 2024-01-27 NOTE — Group Note (Signed)
 Date:  01/27/2024 Time:  9:38 AM  Group Topic/Focus:  Coping With Mental Health Crisis:   The purpose of this group is to help patients identify strategies for coping with mental health crisis.  Group discusses possible causes of crisis and ways to manage them effectively. Goals Group:   The focus of this group is to help patients establish daily goals to achieve during treatment and discuss how the patient can incorporate goal setting into their daily lives to aide in recovery. Orientation:   The focus of this group is to educate the patient on the purpose and policies of crisis stabilization and provide a format to answer questions about their admission.  The group details unit policies and expectations of patients while admitted.    Participation Level:  Did Not Attend  Participation Quality:   n/a  Affect:   n/a  Cognitive:   n/a  Insight: None  Engagement in Group:  Supportive  Modes of Intervention:   n/a  Additional Comments:  n/a  Azalee Course 01/27/2024, 9:38 AM

## 2024-01-27 NOTE — Plan of Care (Signed)
   Problem: Education: Goal: Emotional status will improve Outcome: Progressing Goal: Mental status will improve Outcome: Progressing   Problem: Activity: Goal: Interest or engagement in activities will improve Outcome: Progressing Goal: Sleeping patterns will improve Outcome: Progressing

## 2024-01-27 NOTE — Progress Notes (Addendum)
 Patient denies SI/HI/AVH this morning. Pt rates his depression a 0/10 and anxiety a 0/10. Pt reports that he slept "well" last night. Pt complains of tremors related to alcohol detox, MD notified and PRN Ativan administered per [facility]. Pt has been calm and cooperative throughout the day. Patient has been compliant with medications and treatment plan. Q 15 minute safety checks are in place for patient's safety. Patient is currently safe on the unit.   01/27/24 0900  Psych Admission Type (Psych Patients Only)  Admission Status Voluntary  Psychosocial Assessment  Patient Complaints Substance abuse  Eye Contact Fair  Facial Expression Flat  Affect Depressed;Appropriate to circumstance  Speech Logical/coherent  Interaction Assertive  Motor Activity Slow;Tremors  Appearance/Hygiene Unremarkable  Behavior Characteristics Cooperative;Appropriate to situation  Mood Depressed;Anxious  Thought Process  Coherency WDL  Content WDL  Delusions None reported or observed  Perception WDL  Hallucination None reported or observed  Judgment Impaired  Confusion None  Danger to Self  Current suicidal ideation? Denies  Description of Suicide Plan no plan  Agreement Not to Harm Self Yes  Description of Agreement verbal  Danger to Others  Danger to Others None reported or observed

## 2024-01-27 NOTE — Group Note (Signed)
 Date:  01/27/2024 Time:  4:24 PM  Group Topic/Focus:  Building Self Esteem:   The Focus of this group is helping patients become aware of the effects of self-esteem on their lives, the things they and others do that enhance or undermine their self-esteem, seeing the relationship between their level of self-esteem and the choices they make and learning ways to enhance self-esteem.    Participation Level:  Did Not Attend  Participation Quality:   n/a  Affect:   n/a  Cognitive:   n/a  Insight: None  Engagement in Group:   n/a  Modes of Intervention:   n/a  Additional Comments:    Azalee Course 01/27/2024, 4:24 PM

## 2024-01-27 NOTE — Group Note (Signed)
  [facility] LCSW Group Therapy Note    Type of Therapy and Topic:  Group Therapy:  Identifying Triggers and Coping Mechanisms  Participation Level:  Did Not Attend   Description of Group This process group involved patients discussing what how to identify triggers and what coping skills they can use.  These necessary actions were discussed in detail, including why they are important and methods to be used to ensure they are continued.  The group brainstormed together other possible coping skills that would benefit patients by being continued after discharge.  Therapeutic Goals Patients will identify and describe behavioral triggers and coping mechanisms. Patients will verbalize benefits of these actions Patients will describe specifically how they plan to keep these habits active Patients will brainstorm other necessary actions that can produce positive benefits in the outpatient setting  Summary of Patient Progress:  Patient was invited to group and did not attend.   Therapeutic Modalities Cognitive Behavioral Therapy Motivational Interviewing   Azucena Kuba, MSW, LCSWA

## 2024-01-28 DIAGNOSIS — F332 Major depressive disorder, recurrent severe without psychotic features: Secondary | ICD-10-CM | POA: Diagnosis not present

## 2024-01-28 MED ORDER — CHLORDIAZEPOXIDE HCL 5 MG PO CAPS
5.0000 mg | ORAL_CAPSULE | Freq: Every day | ORAL | Status: AC
  Administered 2024-01-30 – 2024-01-31 (×2): 5 mg via ORAL
  Filled 2024-01-28 (×2): qty 1

## 2024-01-28 MED ORDER — CHLORDIAZEPOXIDE HCL 25 MG PO CAPS
25.0000 mg | ORAL_CAPSULE | Freq: Once | ORAL | Status: AC
  Administered 2024-01-28: 25 mg via ORAL
  Filled 2024-01-28: qty 1

## 2024-01-28 MED ORDER — CHLORDIAZEPOXIDE HCL 5 MG PO CAPS
10.0000 mg | ORAL_CAPSULE | Freq: Every day | ORAL | Status: AC
  Administered 2024-01-29: 10 mg via ORAL
  Filled 2024-01-28: qty 2

## 2024-01-28 NOTE — Plan of Care (Signed)
   Problem: Education: Goal: Emotional status will improve Outcome: Progressing Goal: Mental status will improve Outcome: Progressing Goal: Verbalization of understanding the information provided will improve Outcome: Progressing

## 2024-01-28 NOTE — Plan of Care (Signed)
   Problem: Education: Goal: Emotional status will improve Outcome: Progressing Goal: Mental status will improve Outcome: Progressing Goal: Verbalization of understanding the information provided will improve Outcome: Progressing   Problem: Coping: Goal: Ability to demonstrate self-control will improve Outcome: Progressing   Problem: Safety: Goal: Periods of time without injury will increase Outcome: Progressing

## 2024-01-28 NOTE — Progress Notes (Signed)
 [facility] MD Progress Note  01/28/2024 1:55 PM Keane Martelli  MRN:  [id number] Subjective:   53 year old African-American male, veteran, runs his own business, his fiance lives with him. Background history of alcohol use disorder. Self-presented to the emergency room for detox. History of alcohol withdrawal seizures and was managed medically before being transferred to our unit.  Chart reviewed today.  Patient discussed at multidisciplinary team meeting.  Nursing staff reports that patient slept for 8.75 hours.  He is still on CIWA protocol.  CIWA score was 2.  No observed response to internal stimuli.  He is tolerating his food and fluids well.  Seen today.  Patient tells me that he still feels tremulous.  States that he has been drinking a lot over the years.  Requesting regular benzodiazepines as he had seizures in the past.  He is not experiencing any form of hallucination.  Is not endorsing any form of delusion.  He is not endorsing any self-injurious thoughts.  He has been in communication with his fiance who remains supportive.  He is not endorsing any new stressor.  We discussed adding low-dose chlordiazepoxide which we will wean off gradually.  Encouraged to keep ventilating his feelings to staff.  Principal Problem: <principal problem not specified> Diagnosis: Active Problems:   MDD (major depressive disorder), recurrent episode, severe (HCC)  Total Time spent with patient: 30 minutes  Past Psychiatric History:  See H&P.  Past Medical History:  Past Medical History:  Diagnosis Date   Alcoholic (HCC)    Hypertension    Seizures (HCC)     Past Surgical History:  Procedure Laterality Date   dislocated hip     HAND SURGERY     KNEE SURGERY     Family History:  Family History  Problem Relation Age of Onset   Cancer Mother    Cancer Father    Family Psychiatric  History:  See H&P.  Social History:  Social History   Substance and Sexual Activity  Alcohol Use Yes    Comment: "a fifth of vodka every day for 30 years"     Social History   Substance and Sexual Activity  Drug Use No    Social History   Socioeconomic History   Marital status: Married    Spouse name: Not on file   Number of children: Not on file   Years of education: Not on file   Highest education level: Not on file  Occupational History   Not on file  Tobacco Use   Smoking status: Former    Current packs/day: 0.10    Types: Cigarettes   Smokeless tobacco: Never  Vaping Use   Vaping status: Never Used  Substance and Sexual Activity   Alcohol use: Yes    Comment: "a fifth of vodka every day for 30 years"   Drug use: No   Sexual activity: Yes  Other Topics Concern   Not on file  Social History Narrative   Not on file   Social Drivers of Health   Financial Resource Strain: Not on file  Food Insecurity: No Food Insecurity (01/25/2024)   Hunger Vital Sign    Worried About Running Out of Food in the Last Year: Never true    Ran Out of Food in the Last Year: Never true  Transportation Needs: No Transportation Needs (01/25/2024)   PRAPARE - Administrator, Civil Service (Medical): No    Lack of Transportation (Non-Medical): No  Physical Activity: Not on file  Stress: Not on file  Social Connections: Socially Integrated (01/24/2024)   Social Connection and Isolation Panel [NHANES]    Frequency of Communication with Friends and Family: More than three times a week    Frequency of Social Gatherings with Friends and Family: More than three times a week    Attends Religious Services: More than 4 times per year    Active Member of [employer] or Organizations: Yes    Attends Engineer, structural: More than 4 times per year    Marital Status: Living with partner   Additional Social History:    Current Medications: Current Facility-Administered Medications  Medication Dose Route Frequency Provider Last Rate Last Admin   acetaminophen (TYLENOL) tablet 650 mg   650 mg Oral Q6H PRN Starkes-Perry, Juel Burrow, FNP       alum & mag hydroxide-simeth (MAALOX/MYLANTA) 200-200-20 MG/5ML suspension 30 mL  30 mL Oral Q4H PRN Starkes-Perry, Juel Burrow, FNP       haloperidol (HALDOL) tablet 5 mg  5 mg Oral TID PRN Starkes-Perry, Juel Burrow, FNP       And   diphenhydrAMINE (BENADRYL) capsule 50 mg  50 mg Oral TID PRN Rosario Adie, Juel Burrow, FNP       haloperidol lactate (HALDOL) injection 5 mg  5 mg Intramuscular TID PRN Massengill, Harrold Donath, MD       And   diphenhydrAMINE (BENADRYL) injection 50 mg  50 mg Intramuscular TID PRN Massengill, Harrold Donath, MD       And   LORazepam (ATIVAN) injection 2 mg  2 mg Intramuscular TID PRN Massengill, Harrold Donath, MD       haloperidol lactate (HALDOL) injection 10 mg  10 mg Intramuscular TID PRN Massengill, Harrold Donath, MD       And   diphenhydrAMINE (BENADRYL) injection 50 mg  50 mg Intramuscular TID PRN Massengill, Harrold Donath, MD       And   LORazepam (ATIVAN) injection 2 mg  2 mg Intramuscular TID PRN Massengill, Harrold Donath, MD       DULoxetine (CYMBALTA) DR capsule 20 mg  20 mg Oral Daily Maryagnes Amos, FNP   20 mg at 01/28/24 0809   folic acid (FOLVITE) tablet 1 mg  1 mg Oral Daily Maryagnes Amos, FNP   1 mg at 01/28/24 0809   gabapentin (NEURONTIN) capsule 600 mg  600 mg Oral TID Maryagnes Amos, FNP   600 mg at 01/28/24 1249   loperamide (IMODIUM) capsule 2-4 mg  2-4 mg Oral PRN Massengill, Harrold Donath, MD       LORazepam (ATIVAN) tablet 1 mg  1 mg Oral Q6H PRN Massengill, Harrold Donath, MD   1 mg at 01/27/24 0902   magnesium hydroxide (MILK OF MAGNESIA) suspension 30 mL  30 mL Oral Daily PRN Maryagnes Amos, FNP       multivitamin with minerals tablet 1 tablet  1 tablet Oral Daily Maryagnes Amos, FNP   1 tablet at 01/28/24 0809   ondansetron (ZOFRAN-ODT) disintegrating tablet 4 mg  4 mg Oral Q6H PRN Massengill, Harrold Donath, MD       propranolol (INDERAL) tablet 10 mg  10 mg Oral BID Maryagnes Amos, FNP   10 mg at  01/28/24 0809   QUEtiapine (SEROQUEL) tablet 100 mg  100 mg Oral QHS Maryagnes Amos, FNP   100 mg at 01/27/24 2124   rivaroxaban (XARELTO) tablet 10 mg  10 mg Oral Daily Maryagnes Amos, FNP   10 mg at 01/28/24 0809   thiamine (  Vitamin B-1) tablet 100 mg  100 mg Oral Daily Maryagnes Amos, FNP   100 mg at 01/28/24 0932   Or   thiamine (VITAMIN B1) injection 100 mg  100 mg Intravenous Daily Starkes-Perry, Juel Burrow, FNP       traZODone (DESYREL) tablet 100 mg  100 mg Oral QHS Maryagnes Amos, FNP   100 mg at 01/27/24 2124    Lab Results: No results found for this or any previous visit (from the past 48 hours).  Blood Alcohol level:  Lab Results  Component Value Date   ETH <10 01/23/2024   ETH <10 01/18/2024    Metabolic Disorder Labs: Lab Results  Component Value Date   HGBA1C 5.0 12/09/2021   MPG 97 12/09/2021   No results found for: "PROLACTIN" Lab Results  Component Value Date   CHOL 179 12/09/2021   TRIG 90 12/09/2021   HDL 44 12/09/2021   CHOLHDL 4.1 12/09/2021   VLDL 18 12/09/2021   LDLCALC 117 (H) 12/09/2021    Physical Findings: AIMS:  , ,  ,  ,    CIWA:  CIWA-Ar Total: 3 COWS:     Musculoskeletal: Strength & Muscle Tone: within normal limits Gait & Station: broad based Patient leans: N/A  Psychiatric Specialty Exam:  Presentation  General Appearance:  Casually dressed, not in any distress, not sweaty, mild tremor.  Eye Contact: Good eye contact.  Speech: Spontaneous.  Normalizing rate, tone and volume.   Mood and Affect  Mood: Subjectively and objectively better.   Affect: Restricted and appropriate.   Thought Process  Thought Processes: Linear and goal directed.   Descriptions of Associations:Intact   Orientation:Full (Time, Place and Person)   Thought Content:  Future-oriented.  No suicidal thoughts.  No homicidal thoughts.  No thoughts of violence.  No delusional theme.  No negative ruminations.  No guilty  ruminations.  No obsessions.   Hallucinations: No hallucination in any modality.   Sensorium  Memory: Good.   Judgment: Good.   Insight: Good.   Executive Functions  Concentration: Good.   Attention Span: Good.   Recall: Good.   Fund of Knowledge: Good.   Language: Good.   Psychomotor Activity  Normal psychomotor activity.   Physical Exam: Physical Exam ROS Blood pressure 98/68, pulse 87, temperature 98.2 F (36.8 C), temperature source Oral, resp. rate 16, height 5\' 8"  (1.727 m), weight 79.4 kg, SpO2 99%. Body mass index is 26.61 kg/m.   Treatment Plan Summary: Patient is gradually coming off alcohol.  He has an extensive history of drinking on a daily basis.  He had seizures related to alcohol withdrawal in the past.  We have agreed to add low-dose chlordiazepoxide which will be tapered off in a couple of days.  1.  Continue CIWA protocol with Ativan. 2.  Continue duloxetine 20 mg daily. 3.  Continue quetiapine 100 mg at bedtime. 4.  Continue trazodone 100 mg at bedtime. 5.  Continue gabapentin 600 mg 3 times daily. 6.  Librium 25 mg daily for three days. 7.  Continue to encourage unit groups and therapeutic activities. 8.  Social worker will coordinate discharge and aftercare planning.   Georgiann Cocker, MD 01/28/2024, 1:55 PM

## 2024-01-28 NOTE — Progress Notes (Signed)
   01/28/24 0558  15 Minute Checks  Location Bedroom  Visual Appearance Calm  Behavior Sleeping  Sleep (Behavioral Health Patients Only)  Calculate sleep? (Click Yes once per 24 hr at 0600 safety check) Yes  Documented sleep last 24 hours 8.75

## 2024-01-28 NOTE — Progress Notes (Signed)
   01/28/24 2000  Psych Admission Type (Psych Patients Only)  Admission Status Voluntary  Psychosocial Assessment  Patient Complaints Substance abuse  Eye Contact Fair  Facial Expression Animated  Affect Depressed  Speech Logical/coherent  Interaction Assertive  Motor Activity Tremors  Appearance/Hygiene Unremarkable  Behavior Characteristics Cooperative;Appropriate to situation  Mood Depressed;Pleasant  Thought Process  Coherency WDL  Content WDL  Delusions WDL  Perception WDL  Hallucination None reported or observed  Judgment Limited  Confusion None  Danger to Self  Current suicidal ideation? Denies  Agreement Not to Harm Self Yes  Description of Agreement Verbal  Danger to Others  Danger to Others None reported or observed

## 2024-01-28 NOTE — Progress Notes (Signed)
 D: Patient is alert, oriented, pleasant, and cooperative. Denies SI, HI, AVH, and verbally contracts for safety. Patient reports he slept good last night without sleeping medication. Patient reports his appetite as good, energy level as normal, and concentration as good. Patient rates his depression 0/10, hopelessness 0/10, and anxiety 0/10. Patient is tremulous and it is bothering him.   0800 CIWA 2, 1200 CIWA 3, 1800 CIWA 4   A: Scheduled medications administered per MD order. Support provided. Patient educated on safety on the unit and medications. Routine safety checks every 15 minutes. Patient stated understanding to tell nurse about any new physical symptoms. Patient understands to tell staff of any needs.     R: No adverse drug reactions noted. Patient remains safe at this time and will continue to monitor.    01/28/24 1000  Psych Admission Type (Psych Patients Only)  Admission Status Voluntary  Psychosocial Assessment  Patient Complaints Substance abuse  Eye Contact Fair  Facial Expression Animated  Affect Depressed  Speech Logical/coherent  Interaction Assertive  Motor Activity Tremors  Appearance/Hygiene Unremarkable  Behavior Characteristics Cooperative;Appropriate to situation;Calm  Mood Depressed;Pleasant  Thought Process  Coherency WDL  Content WDL  Delusions None reported or observed  Perception WDL  Hallucination None reported or observed  Judgment Limited  Confusion None  Danger to Self  Current suicidal ideation? Denies  Agreement Not to Harm Self Yes  Description of Agreement verbal  Danger to Others  Danger to Others None reported or observed

## 2024-01-28 NOTE — Group Note (Signed)
 Date:  01/28/2024 Time:  8:59 AM  Group Topic/Focus:  Goals Group:   The focus of this group is to help patients establish daily goals to achieve during treatment and discuss how the patient can incorporate goal setting into their daily lives to aide in recovery. Orientation:   The focus of this group is to educate the patient on the purpose and policies of crisis stabilization and provide a format to answer questions about their admission.  The group details unit policies and expectations of patients while admitted.    Participation Level:  Did Not Attend  Participation Quality:   n/a  Affect:   n/a  Cognitive:   n/a  Insight: None  Engagement in Group:   n/a  Modes of Intervention:   n/a  Additional Comments:    Azalee Course 01/28/2024, 8:59 AM

## 2024-01-28 NOTE — [facility] Group Notes (Signed)
 Adult Psychoeducational Group Note  Date:  01/28/2024 Time:  10:43 PM  Group Topic/Focus:  Wrap-Up Group:   The focus of this group is to help patients review their daily goal of treatment and discuss progress on daily workbooks.  Participation Level:  Active  Participation Quality:  Appropriate  Affect:  Appropriate  Cognitive:  Appropriate  Insight: Appropriate  Engagement in Group:  Engaged  Modes of Intervention:  Discussion and Support  Additional Comments:  This group discussed short-term goal planning for the coming week.  Christ Kick 01/28/2024, 10:43 PM

## 2024-01-29 DIAGNOSIS — F332 Major depressive disorder, recurrent severe without psychotic features: Secondary | ICD-10-CM | POA: Diagnosis not present

## 2024-01-29 LAB — LIPID PANEL
Cholesterol: 174 mg/dL (ref 0–200)
HDL: 50 mg/dL (ref >40)
LDL Cholesterol: 91 mg/dL (ref 0–99)
Total CHOL/HDL Ratio: 3.5 ratio
Triglycerides: 165 mg/dL — ABNORMAL HIGH (ref <150)
VLDL: 33 mg/dL (ref 0–40)

## 2024-01-29 LAB — VITAMIN D 25 HYDROXY (VIT D DEFICIENCY, FRACTURES): Vit D, 25-Hydroxy: 29.42 ng/mL — ABNORMAL LOW (ref 30–100)

## 2024-01-29 LAB — FOLATE: Folate: 22.8 ng/mL (ref >5.9)

## 2024-01-29 LAB — HEMOGLOBIN A1C
Hgb A1c MFr Bld: 4.5 % — ABNORMAL LOW (ref 4.8–5.6)
Mean Plasma Glucose: 82.45 mg/dL

## 2024-01-29 LAB — VITAMIN B12: Vitamin B-12: 585 pg/mL (ref 180–914)

## 2024-01-29 LAB — TSH: TSH: 4.117 u[IU]/mL (ref 0.350–4.500)

## 2024-01-29 MED ORDER — LORAZEPAM 1 MG PO TABS
1.0000 mg | ORAL_TABLET | Freq: Four times a day (QID) | ORAL | Status: DC | PRN
  Administered 2024-01-29: 1 mg via ORAL
  Filled 2024-01-29: qty 1

## 2024-01-29 NOTE — ED Notes (Signed)
 Writer called [facility] in regards to note stating patient belongings had been left with security to await readmit to [facility] post medical clearance. Writer was informed to contact [facility] for transport of belongings from [facility] to [facility] and to ensure belongings have name on them. Writer checked with security staff to ensure name on belongings and security stated they would contact [facility] security to arrange for transport of belongings.

## 2024-01-29 NOTE — [facility] Group Notes (Signed)
Patient attended the AA meeting

## 2024-01-29 NOTE — Progress Notes (Signed)
   01/29/24 0900  Psych Admission Type (Psych Patients Only)  Admission Status Voluntary  Psychosocial Assessment  Patient Complaints Substance abuse  Eye Contact Fair  Facial Expression Animated  Affect Anxious  Speech Logical/coherent  Interaction Assertive  Motor Activity Tremors  Appearance/Hygiene Unremarkable  Behavior Characteristics Cooperative;Appropriate to situation  Mood Pleasant  Thought Process  Coherency WDL  Content WDL  Delusions None reported or observed  Perception WDL  Hallucination None reported or observed  Judgment Limited  Confusion None  Danger to Self  Current suicidal ideation? Denies  Agreement Not to Harm Self Yes  Description of Agreement verbal  Danger to Others  Danger to Others None reported or observed

## 2024-01-29 NOTE — Plan of Care (Signed)
  Problem: Education: Goal: Emotional status will improve Outcome: Progressing Goal: Mental status will improve Outcome: Completed/Met Goal: Verbalization of understanding the information provided will improve Outcome: Progressing   Problem: Activity: Goal: Interest or engagement in activities will improve Outcome: Progressing Goal: Sleeping patterns will improve Outcome: Progressing

## 2024-01-29 NOTE — Progress Notes (Signed)
 [facility] MD Progress Note  01/29/2024 12:44 PM Xavier Owens  MRN:  [id number] Principal Problem: Major depressive disorder, recurrent severe without psychotic features (HCC) Diagnosis: Principal Problem:   Major depressive disorder, recurrent severe without psychotic features (HCC) Active Problems:   Alcohol use disorder   ID & Admission Information: Xavier Owens is an 53 y.o. male who  has a past medical history of Alcoholic (HCC), Hypertension, and Seizures (HCC).  He presented on 01/25/2024  2:21 PM for Major depressive disorder, recurrent severe without psychotic features (HCC). Self-presented to the emergency room for detox. History of alcohol withdrawal seizures and was managed medically before being transferred to our unit.   Subjective:   Case was discussed in the multidisciplinary team. MAR was reviewed and patient was compliant with medications. He endorsed tremors and anxiety ON.  PRN's in last 24 hours: Ativan 1 mg Given last night and this morning for CIWA > 10  The patient was seen in the interview room during rounds.  He continues to report withdrawal symptoms, primarily anxiety, and tremor.  He reports that the chlordiazepoxide taper is helping, but he still has required as needed Ativan.  He inquired about taking Ativan regularly, but I advised that due to risks of addiction, dementia, falls, and possible death when combined with alcohol, that the medication would not be prescribed regularly and should be used as sparingly as possible.  Patient expressed understanding.  The patient continues to report subjectively depressed and anxious mood.  He denies suicidal or homicidal ideation.  He denies auditory or visual hallucinations.  He reports that his tremor is slowly related to alcohol, that said he states that he has not been sober for more than 2 weeks in the last several years.  I discussed the possibility of changing propranolol to long-acting, but the patient wishes to  continue the twice a day short acting propranolol.  We discussed restarting his home naltrexone for alcohol use disorder.    Past Psychiatric and Medical Medical History:  Past Medical History:  Diagnosis Date   Alcoholic (HCC)    Hypertension    Seizures (HCC)     Long history of alcohol use disorder.  No other mental health diagnosis. No past trial of any psychotropic medication. No past inpatient care. No use of any other psychoactive substances other than alcohol. Patient has been in rehab once. No past history of mania.  No past history of psychosis.  No past history of self-injurious behavior.  No past suicidal attempts.  No past history of violent behavior. No past history of neuromodulation.  Past Surgical History:  Procedure Laterality Date   dislocated hip     HAND SURGERY     KNEE SURGERY      Family History(Medical and Psychiatric):  Family History  Problem Relation Age of Onset   Cancer Mother    Cancer Father        Social History:  Social History   Substance and Sexual Activity  Alcohol Use Yes   Comment: "a fifth of vodka every day for 30 years"     Social History   Substance and Sexual Activity  Drug Use No    Social History   Socioeconomic History   Marital status: Married    Spouse name: Not on file   Number of children: Not on file   Years of education: Not on file   Highest education level: Not on file  Occupational History   Not on file  Tobacco Use  Smoking status: Former    Current packs/day: 0.10    Types: Cigarettes   Smokeless tobacco: Never  Vaping Use   Vaping status: Never Used  Substance and Sexual Activity   Alcohol use: Yes    Comment: "a fifth of vodka every day for 30 years"   Drug use: No   Sexual activity: Yes  Other Topics Concern   Not on file  Social History Narrative   Not on file   Social Drivers of Health   Financial Resource Strain: Not on file  Food Insecurity: No Food Insecurity (01/25/2024)    Hunger Vital Sign    Worried About Running Out of Food in the Last Year: Never true    Ran Out of Food in the Last Year: Never true  Transportation Needs: No Transportation Needs (01/25/2024)   PRAPARE - Administrator, Civil Service (Medical): No    Lack of Transportation (Non-Medical): No  Physical Activity: Not on file  Stress: Not on file  Social Connections: Socially Integrated (01/24/2024)   Social Connection and Isolation Panel [NHANES]    Frequency of Communication with Friends and Family: More than three times a week    Frequency of Social Gatherings with Friends and Family: More than three times a week    Attends Religious Services: More than 4 times per year    Active Member of [employer] or Organizations: Yes    Attends Engineer, structural: More than 4 times per year    Marital Status: Living with partner   Marital status: Single Are you sexually active?: No What is your sexual orientation?: "heterosexual" Has your sexual activity been affected by drugs, alcohol, medication, or emotional stress?: "no" Does patient have children?: Yes How many children?: 2 How is patient's relationship with their children?: "great" Pt reports one of his daughters he did not find out about until later     Current Medications: Current Facility-Administered Medications  Medication Dose Route Frequency Provider Last Rate Last Admin   acetaminophen (TYLENOL) tablet 650 mg  650 mg Oral Q6H PRN Starkes-Perry, Juel Burrow, FNP       alum & mag hydroxide-simeth (MAALOX/MYLANTA) 200-200-20 MG/5ML suspension 30 mL  30 mL Oral Q4H PRN Starkes-Perry, Juel Burrow, FNP       chlordiazePOXIDE (LIBRIUM) capsule 10 mg  10 mg Oral Q2000 Izediuno, Delight Ovens, MD       [START ON 01/30/2024] chlordiazePOXIDE (LIBRIUM) capsule 5 mg  5 mg Oral Daily Izediuno, Vincent A, MD       haloperidol (HALDOL) tablet 5 mg  5 mg Oral TID PRN Starkes-Perry, Juel Burrow, FNP       And   diphenhydrAMINE (BENADRYL) capsule  50 mg  50 mg Oral TID PRN Starkes-Perry, Juel Burrow, FNP       haloperidol lactate (HALDOL) injection 5 mg  5 mg Intramuscular TID PRN Massengill, Harrold Donath, MD       And   diphenhydrAMINE (BENADRYL) injection 50 mg  50 mg Intramuscular TID PRN Massengill, Harrold Donath, MD       And   LORazepam (ATIVAN) injection 2 mg  2 mg Intramuscular TID PRN Massengill, Harrold Donath, MD       haloperidol lactate (HALDOL) injection 10 mg  10 mg Intramuscular TID PRN Massengill, Harrold Donath, MD       And   diphenhydrAMINE (BENADRYL) injection 50 mg  50 mg Intramuscular TID PRN Massengill, Harrold Donath, MD       And   LORazepam (ATIVAN) injection 2  mg  2 mg Intramuscular TID PRN Massengill, Harrold Donath, MD       DULoxetine (CYMBALTA) DR capsule 20 mg  20 mg Oral Daily Maryagnes Amos, FNP   20 mg at 01/29/24 6578   folic acid (FOLVITE) tablet 1 mg  1 mg Oral Daily Maryagnes Amos, FNP   1 mg at 01/29/24 4696   gabapentin (NEURONTIN) capsule 600 mg  600 mg Oral TID Maryagnes Amos, FNP   600 mg at 01/29/24 1149   loperamide (IMODIUM) capsule 2-4 mg  2-4 mg Oral PRN Massengill, Harrold Donath, MD       LORazepam (ATIVAN) tablet 1 mg  1 mg Oral Q6H PRN Massengill, Harrold Donath, MD   1 mg at 01/29/24 0944   magnesium hydroxide (MILK OF MAGNESIA) suspension 30 mL  30 mL Oral Daily PRN Maryagnes Amos, FNP       multivitamin with minerals tablet 1 tablet  1 tablet Oral Daily Maryagnes Amos, FNP   1 tablet at 01/29/24 0835   ondansetron (ZOFRAN-ODT) disintegrating tablet 4 mg  4 mg Oral Q6H PRN Massengill, Harrold Donath, MD       propranolol (INDERAL) tablet 10 mg  10 mg Oral BID Maryagnes Amos, FNP   10 mg at 01/29/24 2952   QUEtiapine (SEROQUEL) tablet 100 mg  100 mg Oral QHS Maryagnes Amos, FNP   100 mg at 01/28/24 2129   rivaroxaban (XARELTO) tablet 10 mg  10 mg Oral Daily Maryagnes Amos, FNP   10 mg at 01/29/24 8413   thiamine (Vitamin B-1) tablet 100 mg  100 mg Oral Daily Maryagnes Amos, FNP   100  mg at 01/29/24 2440   Or   thiamine (VITAMIN B1) injection 100 mg  100 mg Intravenous Daily Starkes-Perry, Juel Burrow, FNP       traZODone (DESYREL) tablet 100 mg  100 mg Oral QHS Maryagnes Amos, FNP   100 mg at 01/28/24 2129    Lab Results:  No results found for this or any previous visit (from the past 48 hours).  Blood Alcohol level:  Lab Results  Component Value Date   ETH <10 01/23/2024   ETH <10 01/18/2024    Metabolic Disorder Labs: Lab Results  Component Value Date   HGBA1C 5.0 12/09/2021   MPG 97 12/09/2021   No results found for: "PROLACTIN" Lab Results  Component Value Date   CHOL 179 12/09/2021   TRIG 90 12/09/2021   HDL 44 12/09/2021   CHOLHDL 4.1 12/09/2021   VLDL 18 12/09/2021   LDLCALC 117 (H) 12/09/2021    Physical Findings: AIMS:  , ,  ,  ,    CIWA:  CIWA-Ar Total: 2 COWS:     Psychiatric Specialty Exam:  Presentation  General Appearance: Fairly Groomed  Eye Contact: Good  Speech: Slow; Clear and Coherent  Speech Volume: Decreased  Handedness: Right   Mood and Affect  Mood: Depressed; Anxious  Affect: Restricted; Congruent   Thought Process  Thought Processes: Linear  Descriptions of Associations: Intact  Orientation: Full (Time, Place and Person)  Thought Content: Logical  History of Schizophrenia/Schizoaffective disorder: No  Duration of Psychotic Symptoms: NA Hallucinations: Hallucinations: None  Ideas of Reference: None  Suicidal Thoughts: Suicidal Thoughts: No  Homicidal Thoughts: Homicidal Thoughts: No   Sensorium  Memory: Immediate Good; Recent Good  Judgment: Poor  Insight: Fair   Art therapist  Concentration: Fair  Attention Span: Good  Recall: Good  Fund of Knowledge: Good  Language: Good   Psychomotor Activity  Psychomotor Activity: Psychomotor Activity: Restlessness   Assets  Assets: Communication Skills; Desire for Improvement; Social Support   Sleep  Sleep: Sleep:  Good   Musculoskeletal: Strength & Muscle Tone: within normal limits Gait & Station: normal Patient leans: N/A   Physical Exam: General: Sitting comfortably. NAD. HEENT: Normocephalic, atraumatic, MMM, EMOI Lungs: no increased work of breathing noted Heart: no cyanosis Abdomen: Non distended Musculoskeletal: FROM. No obvious deformities Skin: Warm, dry, intact. No rashes noted Neuro: No obvious focal deficits.  Gait and station are normal  Review of Systems  Constitutional: Negative.   HENT: Negative.    Eyes: Negative.   Respiratory: Negative.    Cardiovascular: Negative.   Gastrointestinal: Negative.   Genitourinary: Negative.   Skin: Negative.   Neurological: Negative.   Psychiatric/Behavioral:  Positive for depression, anxiety, withdrawal symptoms.     Blood pressure 103/72, pulse 75, temperature 98 F (36.7 C), temperature source Oral, resp. rate 16, height 5\' 8"  (1.727 m), weight 79.4 kg, SpO2 98%. Body mass index is 26.61 kg/m.  ASSESSMENT: Xavier Owens is an 53 y.o. male who  has a past medical history of Alcoholic (HCC), Hypertension, and Seizures (HCC).  He presented on 01/25/2024  2:21 PM for Major depressive disorder, recurrent severe without psychotic features (HCC).  Self-presented to the emergency room for detox. History of alcohol withdrawal seizures and was managed medically before being transferred to our unit.   Diagnoses / Active Problems: Patient Active Problem List   Diagnosis Date Noted   Major depressive disorder, recurrent severe without psychotic features (HCC) 01/25/2024   Alcohol withdrawal seizure (HCC) 01/23/2024   Alcohol use disorder, severe, dependence (HCC) 01/18/2024   Healthcare maintenance 06/10/2022   Nightmares 12/21/2021   Subclinical hypothyroidism 12/21/2021   Substance induced mood disorder (HCC) 12/14/2021   Alcohol use disorder 06/23/2021   Seizures (HCC) 06/22/2021   Elevated liver enzymes 04/19/2016   Essential  hypertension 04/19/2016      PLAN: Safety and Monitoring:  -- Voluntary admission to inpatient psychiatric unit for safety, stabilization and treatment  -- Daily contact with patient to assess and evaluate symptoms and progress in treatment  -- Patient's case to be discussed in multi-disciplinary team meeting  -- Observation Level : q15 minute checks  -- Vital signs:  q12 hours  -- Precautions: suicide, elopement, and assault  2. Psychiatric Diagnoses and Treatment:  Patient Active Problem List   Diagnosis Date Noted   Major depressive disorder, recurrent severe without psychotic features (HCC) 01/25/2024   Alcohol withdrawal seizure (HCC) 01/23/2024   Alcohol use disorder, severe, dependence (HCC) 01/18/2024   Healthcare maintenance 06/10/2022   Nightmares 12/21/2021   Subclinical hypothyroidism 12/21/2021   Substance induced mood disorder (HCC) 12/14/2021   Alcohol use disorder 06/23/2021   Seizures (HCC) 06/22/2021   Elevated liver enzymes 04/19/2016   Essential hypertension 04/19/2016     Scheduled Medications:  chlordiazePOXIDE  10 mg Oral Q2000   [START ON 01/30/2024] chlordiazePOXIDE  5 mg Oral Daily   DULoxetine  20 mg Oral Daily   folic acid  1 mg Oral Daily   gabapentin  600 mg Oral TID   multivitamin with minerals  1 tablet Oral Daily   propranolol  10 mg Oral BID   QUEtiapine  100 mg Oral QHS   rivaroxaban  10 mg Oral Daily   thiamine  100 mg Oral Daily   Or   thiamine  100 mg Intravenous Daily   traZODone  100 mg Oral QHS    As Needed Medications: acetaminophen, alum & mag hydroxide-simeth, haloperidol **AND** diphenhydrAMINE, haloperidol lactate **AND** diphenhydrAMINE **AND** LORazepam, haloperidol lactate **AND** diphenhydrAMINE **AND** LORazepam, loperamide, LORazepam, magnesium hydroxide, ondansetron    3. Medical Issues Being Addressed:   -- As above  Labs reviewed, unremarkable with the exception of: Anemia, UDS positive for benzodiazepines, will  check intake labs   4. Discharge Planning:   -- Social work and case management to assist with discharge planning and identification of hospital follow-up needs prior to discharge  -- Estimated LOS: Likely discharge by the end of the week  -- Discharge Concerns: Need to establish a safety plan; Medication compliance and effectiveness  -- Discharge Goals: Return home with outpatient referrals for mental health follow-up including medication management/psychotherapy  5. Short Term Goals:  Improve ability to identify changes in lifestyle to reduce recurrence of condition, verbalize feelings, disclose and discuss suicidal ideas, demonstrate self-control, identify and develop effective coping behaviors, compliance with prescribed medications, identify triggers associated with substance abuse/mental health issues, participate in unit milieu and in scheduled group therapies   6. Long Term Goals: Improvement in symptoms so the patient is ready for discharge   --The risks/benefits/side-effects/alternatives to the medications above were discussed in detail with the patient and time was given for questions. The patient provided informed consent.   -- Metabolic profile and EKG monitoring obtained while on an atypical antipsychotic and listed in the EHR    Total Time Spent in Direct Patient Care:  I personally spent 35 minutes on the unit in direct patient care. The direct patient care time included face-to-face time with the patient, reviewing the patient's chart, communicating with other professionals, and coordinating care. Greater than 50% of this time was spent in counseling or coordinating care with the patient regarding goals of hospitalization, psycho-education, and discharge planning needs.      Criss Alvine, MD Psychiatrist  01/29/2024, 12:44 PM   I certify that inpatient services furnished can reasonably be expected to improve the patient's condition.    Portions of this note were  created using voice recognition software. Minor syntax errors, grammatical content, spelling, or punctuation errors may have occurred unintentionally. Please notify the There[facility] Parkin if the meaning of any statement is unclear.

## 2024-01-29 NOTE — Group Note (Signed)
 Therapy Group Note  Group Topic:Other  Group Date: 01/29/2024 Start Time: 1430 End Time: 1500 Facilitators: Ted Mcalpine, OT    The primary objective of this topic is to explore and understand the concept of occupational balance in the context of daily living. The term "occupational balance" is defined broadly, encompassing all activities that occupy an individual's time and energy, including self-care, leisure, and work-related tasks. The goal is to guide participants towards achieving a harmonious blend of these activities, tailored to their personal values and life circumstances. This balance is aimed at enhancing overall well-being, not by equally distributing time across activities, but by ensuring that daily engagements are fulfilling and not draining. The content delves into identifying various barriers that individuals face in achieving occupational balance, such as overcommitment, misaligned priorities, external pressures, and lack of effective time management. The impact of these barriers on occupational performance, roles, and lifestyles is examined, highlighting issues like reduced efficiency, strained relationships, and potential health problems. Strategies for cultivating occupational balance are a key focus. These strategies include practical methods like time blocking, prioritizing tasks, establishing self-care rituals, decluttering, connecting with nature, and engaging in reflective practices. These approaches are designed to be adaptable and applicable to a wide range of life scenarios, promoting a proactive and mindful approach to daily living. The overall aim is to equip participants with the knowledge and tools to create a balanced lifestyle that supports their mental, emotional, and physical health, thereby improving their functional performance in daily life.     Participation Level: Engaged   Participation Quality: Independent   Behavior: Appropriate   Speech/Thought  Process: Relevant   Affect/Mood: Appropriate   Insight: Fair   Judgement: Fair      Modes of Intervention: Education  Patient Response to Interventions:  Attentive   Plan: Continue to engage patient in OT groups 2 - 3x/week.  01/29/2024  Ted Mcalpine, OT  Kerrin Champagne, OT

## 2024-01-29 NOTE — ED Notes (Signed)
 [facility] picked up patient belongings in brown paper bag at 1601 for transport to [facility] at [address] [facility] [place]. Driver instructed to leave bag at front desk as per [facility] staff instruction.

## 2024-01-29 NOTE — Group Note (Signed)
 Recreation Therapy Group Note   Group Topic:Stress Management  Group Date: 01/29/2024 Start Time: 0945 End Time: 1000 Facilitators: Betsaida [place]-McCall, LRT,CTRS Location: [address] Dayroom   Group Topic: Stress Management   Goal Area(s) Addresses:  Patient will actively participate in stress management techniques presented during session.  Patient will successfully identify benefit of practicing stress management post d/c.   Intervention: Relaxation exercise with ambient sound and script   Activity: Guided Imagery. LRT provided education, instruction, and demonstration on practice of visualization via guided imagery. Patient was asked to participate in the technique introduced during session. LRT debriefed including topics of mindfulness, stress management and specific scenarios each patient could use these techniques. Patients were given suggestions of ways to access scripts post d/c and encouraged to explore Youtube and other apps available on smartphones, tablets, and computers.  Education:  Stress Management, Discharge Planning.   Education Outcome: Acknowledges education   Affect/Mood: N/A   Participation Level: Did not attend    Clinical Observations/Individualized Feedback:      Plan: Continue to engage patient in RT group sessions 2-3x/week.   Abu Heavin-McCall, LRT,CTRS 01/29/2024 12:39 PM

## 2024-01-29 NOTE — ED Notes (Signed)
 Writer informed by [facility] security that [facility] security is unable to transport patient belongings at this time and best option will be to use [facility] for transport of patient belongings. DASH called at 1515. DASH given address of drop off ([address] [place]) and description of belongings (brown paper bag). DASH instructed to pick up belongings at [facility] front desk from security and to drop off belongings at [facility] front desk.

## 2024-01-30 ENCOUNTER — Encounter: Payer: MEDICAID | Admitting: Student

## 2024-01-30 DIAGNOSIS — F332 Major depressive disorder, recurrent severe without psychotic features: Secondary | ICD-10-CM | POA: Diagnosis not present

## 2024-01-30 LAB — RPR: RPR Ser Ql: NONREACTIVE

## 2024-01-30 MED ORDER — VITAMIN D3 25 MCG PO TABS
2000.0000 [IU] | ORAL_TABLET | Freq: Every day | ORAL | Status: DC
  Administered 2024-01-30 – 2024-02-03 (×5): 2000 [IU] via ORAL
  Filled 2024-01-30 (×6): qty 2

## 2024-01-30 MED ORDER — DULOXETINE HCL 30 MG PO CPEP
30.0000 mg | ORAL_CAPSULE | Freq: Every day | ORAL | Status: DC
  Administered 2024-01-31: 30 mg via ORAL
  Filled 2024-01-30 (×2): qty 1

## 2024-01-30 NOTE — Progress Notes (Signed)
   01/30/24 0745  Psych Admission Type (Psych Patients Only)  Admission Status Voluntary  Psychosocial Assessment  Patient Complaints Substance abuse  Eye Contact Fair  Facial Expression Animated  Affect Anxious  Speech Logical/coherent  Interaction Assertive  Motor Activity Tremors  Appearance/Hygiene Unremarkable  Behavior Characteristics Appropriate to situation  Mood Pleasant  Thought Process  Coherency WDL  Content WDL  Delusions None reported or observed  Perception WDL  Hallucination None reported or observed  Judgment Limited  Confusion None  Danger to Self  Current suicidal ideation? Denies  Agreement Not to Harm Self Yes  Description of Agreement verbal  Danger to Others  Danger to Others None reported or observed

## 2024-01-30 NOTE — Group Note (Signed)
 Date:  01/30/2024 Time:  10:31 PM  Group Topic/Focus:  Wrap-Up Group:   The focus of this group is to help patients review their daily goal of treatment and discuss progress on daily workbooks.    Participation Level:  Active  Participation Quality:  Appropriate and Sharing  Affect:  Appropriate  Cognitive:  Appropriate  Insight: Appropriate  Engagement in Group:  Engaged  Modes of Intervention:  Discussion and Socialization  Additional Comments:  Patient rated his day a 10 out of 10. Patient shared that he is doing, "alright" and that he, "slept most of the day". Patient shared that his daughter is graduating this weekend.   Kennieth Francois 01/30/2024, 10:31 PM

## 2024-01-30 NOTE — Group Note (Signed)
 LCSW Group Therapy Note   Group Date: 01/30/2024 Start Time: 1100 End Time: 1200   Participation:  did not attend  Type of Therapy:  Group Therapy   Topic:  Money Matters: Ecologist, Confidence and Peace of Mind  Objective: To help participants understand the impact of financial stability on well-being through the lens of Maslow's Hierarchy of Needs and develop practical strategies for budgeting, saving, and debt repayment.  Goals: Increase awareness of spending habits and financial priorities, recognizing how money supports basic needs, security, and relationships. Develop simple budgeting and saving strategies to enhance stability and peace of mind.  Reduce financial stress by creating a realistic debt repayment plan, supporting long-term confidence and well-being.  Summary:  Participants explored how financial stability connects to basic needs, relationships, and self-esteem using Maslow's Hierarchy. They discussed budgeting, saving, and debt repayment strategies, identifying small, manageable changes. Through interactive discussion and self-reflection, they gained insight into their financial habits and created personal action steps for improvement.  Therapeutic Modalities Used: Elements of Cognitive Behavioral Therapy (CBT) - Addressing financial stress and thought patterns. Psychoeducation - Engineer, agricultural. Elements of Motivational Interviewing (MI) - Encouraging realistic, achievable changes. Group Support - Reducing shame and stress through shared experiences.   Alla Feeling, LCSWA 01/30/2024  5:52 PM

## 2024-01-30 NOTE — Progress Notes (Signed)
   01/29/24 2055  Psych Admission Type (Psych Patients Only)  Admission Status Voluntary  Psychosocial Assessment  Patient Complaints Substance abuse  Eye Contact Fair  Facial Expression Animated  Affect Anxious  Speech Logical/coherent  Interaction Assertive  Motor Activity Tremors  Appearance/Hygiene Unremarkable  Behavior Characteristics Appropriate to situation  Mood Pleasant  Thought Process  Coherency WDL  Content WDL  Delusions None reported or observed  Perception WDL  Hallucination None reported or observed  Judgment Limited  Confusion None  Danger to Self  Current suicidal ideation? Denies  Description of Agreement verbal  Danger to Others  Danger to Others None reported or observed

## 2024-01-30 NOTE — Progress Notes (Signed)
 [facility] MD Progress Note  01/30/2024 2:27 PM Xavier Owens  MRN:  [id number] Principal Problem: Major depressive disorder, recurrent severe without psychotic features (HCC) Diagnosis: Principal Problem:   Major depressive disorder, recurrent severe without psychotic features (HCC) Active Problems:   Alcohol use disorder   ID & Admission Information: Xavier Owens is an 53 y.o. male who  has a past medical history of Alcoholic (HCC), Hypertension, and Seizures (HCC).  He presented on 01/25/2024  2:21 PM for Major depressive disorder, recurrent severe without psychotic features (HCC). Self-presented to the emergency room for detox. History of alcohol withdrawal seizures and was managed medically before being transferred to our unit.   Subjective:   Case was discussed in the multidisciplinary team. MAR was reviewed and patient was compliant with medications. He continued to endorse tremors and anxiety ON.  PRN's in last 24 hours: Tylenol 650 mg prescribed to for chronic pain  The patient was seen in the interview room during rounds.  He continues to report withdrawal symptoms, primarily anxiety, and tremor.  He states that his mood is "doing better", but his affect remains dysphoric.  He minimizes his problems and tells to "take care of all these other patients."    We discussed increasing Cymbalta to target mood symptoms, with hopeful benefit to chronic pain.     Past Psychiatric and Medical Medical History:  Past Medical History:  Diagnosis Date   Alcoholic (HCC)    Hypertension    Seizures (HCC)     Long history of alcohol use disorder.  No other mental health diagnosis. No past trial of any psychotropic medication. No past inpatient care. No use of any other psychoactive substances other than alcohol. Patient has been in rehab once. No past history of mania.  No past history of psychosis.  No past history of self-injurious behavior.  No past suicidal attempts.  No past history  of violent behavior. No past history of neuromodulation.  Past Surgical History:  Procedure Laterality Date   dislocated hip     HAND SURGERY     KNEE SURGERY      Family History(Medical and Psychiatric):  Family History  Problem Relation Age of Onset   Cancer Mother    Cancer Father        Social History:  Social History   Substance and Sexual Activity  Alcohol Use Yes   Comment: "a fifth of vodka every day for 30 years"     Social History   Substance and Sexual Activity  Drug Use No    Social History   Socioeconomic History   Marital status: Married    Spouse name: Not on file   Number of children: Not on file   Years of education: Not on file   Highest education level: Not on file  Occupational History   Not on file  Tobacco Use   Smoking status: Former    Current packs/day: 0.10    Types: Cigarettes   Smokeless tobacco: Never  Vaping Use   Vaping status: Never Used  Substance and Sexual Activity   Alcohol use: Yes    Comment: "a fifth of vodka every day for 30 years"   Drug use: No   Sexual activity: Yes  Other Topics Concern   Not on file  Social History Narrative   Not on file   Social Drivers of Health   Financial Resource Strain: Not on file  Food Insecurity: No Food Insecurity (01/25/2024)   Hunger Vital Sign  Worried About Programme researcher, broadcasting/film/video in the Last Year: Never true    Ran Out of Food in the Last Year: Never true  Transportation Needs: No Transportation Needs (01/25/2024)   PRAPARE - Administrator, Civil Service (Medical): No    Lack of Transportation (Non-Medical): No  Physical Activity: Not on file  Stress: Not on file  Social Connections: Socially Integrated (01/24/2024)   Social Connection and Isolation Panel [NHANES]    Frequency of Communication with Friends and Family: More than three times a week    Frequency of Social Gatherings with Friends and Family: More than three times a week    Attends Religious  Services: More than 4 times per year    Active Member of [employer] or Organizations: Yes    Attends Engineer, structural: More than 4 times per year    Marital Status: Living with partner   Marital status: Single Are you sexually active?: No What is your sexual orientation?: "heterosexual" Has your sexual activity been affected by drugs, alcohol, medication, or emotional stress?: "no" Does patient have children?: Yes How many children?: 2 How is patient's relationship with their children?: "great" Pt reports one of his daughters he did not find out about until later     Current Medications: Current Facility-Administered Medications  Medication Dose Route Frequency Provider Last Rate Last Admin   acetaminophen (TYLENOL) tablet 650 mg  650 mg Oral Q6H PRN Maryagnes Amos, FNP   650 mg at 01/30/24 0750   alum & mag hydroxide-simeth (MAALOX/MYLANTA) 200-200-20 MG/5ML suspension 30 mL  30 mL Oral Q4H PRN Starkes-Perry, Juel Burrow, FNP       chlordiazePOXIDE (LIBRIUM) capsule 5 mg  5 mg Oral Daily Izediuno, Delight Ovens, MD   5 mg at 01/30/24 0749   haloperidol (HALDOL) tablet 5 mg  5 mg Oral TID PRN Maryagnes Amos, FNP       And   diphenhydrAMINE (BENADRYL) capsule 50 mg  50 mg Oral TID PRN Starkes-Perry, Juel Burrow, FNP       haloperidol lactate (HALDOL) injection 5 mg  5 mg Intramuscular TID PRN Massengill, Harrold Donath, MD       And   diphenhydrAMINE (BENADRYL) injection 50 mg  50 mg Intramuscular TID PRN Massengill, Harrold Donath, MD       And   LORazepam (ATIVAN) injection 2 mg  2 mg Intramuscular TID PRN Massengill, Harrold Donath, MD       haloperidol lactate (HALDOL) injection 10 mg  10 mg Intramuscular TID PRN Massengill, Harrold Donath, MD       And   diphenhydrAMINE (BENADRYL) injection 50 mg  50 mg Intramuscular TID PRN Massengill, Harrold Donath, MD       And   LORazepam (ATIVAN) injection 2 mg  2 mg Intramuscular TID PRN Massengill, Harrold Donath, MD       Melene Muller ON 01/31/2024] DULoxetine (CYMBALTA) DR  capsule 30 mg  30 mg Oral Daily Golda Acre, MD       folic acid (FOLVITE) tablet 1 mg  1 mg Oral Daily Maryagnes Amos, FNP   1 mg at 01/30/24 0749   gabapentin (NEURONTIN) capsule 600 mg  600 mg Oral TID Maryagnes Amos, FNP   600 mg at 01/30/24 1202   LORazepam (ATIVAN) tablet 1 mg  1 mg Oral Q6H PRN Massengill, Harrold Donath, MD   1 mg at 01/29/24 0944   magnesium hydroxide (MILK OF MAGNESIA) suspension 30 mL  30 mL Oral Daily PRN Starkes-Perry,  Juel Burrow, FNP       multivitamin with minerals tablet 1 tablet  1 tablet Oral Daily Maryagnes Amos, FNP   1 tablet at 01/30/24 0749   propranolol (INDERAL) tablet 10 mg  10 mg Oral BID Maryagnes Amos, FNP   10 mg at 01/30/24 0749   QUEtiapine (SEROQUEL) tablet 100 mg  100 mg Oral QHS Maryagnes Amos, FNP   100 mg at 01/29/24 2133   rivaroxaban (XARELTO) tablet 10 mg  10 mg Oral Daily Maryagnes Amos, FNP   10 mg at 01/30/24 8295   thiamine (Vitamin B-1) tablet 100 mg  100 mg Oral Daily Maryagnes Amos, FNP   100 mg at 01/30/24 6213   Or   thiamine (VITAMIN B1) injection 100 mg  100 mg Intravenous Daily Maryagnes Amos, FNP       traZODone (DESYREL) tablet 100 mg  100 mg Oral QHS Maryagnes Amos, FNP   100 mg at 01/29/24 2133   vitamin D3 (CHOLECALCIFEROL) tablet 2,000 Units  2,000 Units Oral Daily Golda Acre, MD   2,000 Units at 01/30/24 1202    Lab Results:  Results for orders placed or performed during the hospital encounter of 01/25/24 (from the past 48 hours)  Folate     Status: None   Collection Time: 01/29/24  6:49 PM  Result Value Ref Range   Folate 22.8 >5.9 ng/mL    Comment: Performed at [facility], [address], [place], [place] [address]  Hemoglobin A1c     Status: Abnormal   Collection Time: 01/29/24  6:49 PM  Result Value Ref Range   Hgb A1c MFr Bld 4.5 (L) 4.8 - 5.6 %    Comment: (NOTE) Pre diabetes:          5.7%-6.4%  Diabetes:               >6.4%  Glycemic control for   <7.0% adults with diabetes    Mean Plasma Glucose 82.45 mg/dL    Comment: Performed at [facility], [address] [place], [place] [address]  Lipid panel     Status: Abnormal   Collection Time: 01/29/24  6:49 PM  Result Value Ref Range   Cholesterol 174 0 - 200 mg/dL   Triglycerides 295 (H) <150 mg/dL   HDL 50 >28 mg/dL   Total CHOL/HDL Ratio 3.5 RATIO   VLDL 33 0 - 40 mg/dL   LDL Cholesterol 91 0 - 99 mg/dL    Comment:        Total Cholesterol/HDL:CHD Risk Coronary Heart Disease Risk Table                     Men   Women  1/2 Average Risk   3.4   3.3  Average Risk       5.0   4.4  2 X Average Risk   9.6   7.1  3 X Average Risk  23.4   11.0        Use the calculated Patient Ratio above and the CHD Risk Table to determine the patient's CHD Risk.        ATP III CLASSIFICATION (LDL):  <100     mg/dL   Optimal  413-244  mg/dL   Near or Above                    Optimal  130-159  mg/dL   Borderline  160-189  mg/dL   High  >562     mg/dL   Very High Performed at [facility], [address] [place], [place] [address]   RPR     Status: None   Collection Time: 01/29/24  6:49 PM  Result Value Ref Range   RPR Ser Ql NON REACTIVE NON REACTIVE    Comment: Performed at [facility], [address] [place], [place] [address]  TSH     Status: None   Collection Time: 01/29/24  6:49 PM  Result Value Ref Range   TSH 4.117 0.350 - 4.500 uIU/mL    Comment: Performed by a 3rd Generation assay with a functional sensitivity of <=0.01 uIU/mL. Performed at [facility], [address], [place], [place] [address]   Vitamin B12     Status: None   Collection Time: 01/29/24  6:49 PM  Result Value Ref Range   Vitamin B-12 585 180 - 914 pg/mL    Comment: (NOTE) This assay is not validated for testing neonatal or myeloproliferative syndrome specimens for Vitamin B12 levels. Performed at [facility], [address] [place], [place] [address]   VITAMIN D 25 Hydroxy (Vit-D Deficiency, Fractures)     Status: Abnormal   Collection Time: 01/29/24  6:49 PM  Result Value Ref Range   Vit D, 25-Hydroxy 29.42 (L) 30 - 100 ng/mL    Comment: (NOTE) Vitamin D deficiency has been defined by the Institute of Medicine  and an Endocrine Society practice guideline as a level of serum 25-OH  vitamin D less than 20 ng/mL (1,2). The Endocrine Society went on to  further define vitamin D insufficiency as a level between 21 and 29  ng/mL (2).  1. IOM (Institute of Medicine). 2010. Dietary reference intakes for  calcium and D. [place]: [employer]. 2. Holick MF, [place], Bischoff-Ferrari HA, et al. Evaluation,  treatment, and prevention of vitamin D deficiency: an Endocrine  Society clinical practice guideline, JCEM. 2011 Jul; 96(7): 1911-30.  Performed at [facility], [address], [place], [place] [address]     Blood Alcohol level:  Lab Results  Component Value Date   [facility] <10 01/23/2024   ETH <10 01/18/2024    Metabolic Disorder Labs: Lab Results  Component Value Date   HGBA1C 4.5 (L) 01/29/2024   MPG 82.45 01/29/2024   MPG 97 12/09/2021   No results found for: "PROLACTIN" Lab Results  Component Value Date   CHOL 174 01/29/2024   TRIG 165 (H) 01/29/2024   HDL 50 01/29/2024   CHOLHDL 3.5 01/29/2024   VLDL 33 01/29/2024   LDLCALC 91 01/29/2024   LDLCALC 117 (H) 12/09/2021    Physical Findings: AIMS:  , ,  ,  ,    CIWA:  CIWA-Ar Total: 3 COWS:     Psychiatric Specialty Exam:  Presentation  General Appearance: Appropriate for Environment  Eye Contact: Good  Speech: Clear and Coherent  Speech Volume: Decreased  Handedness: Right   Mood and Affect  Mood: Depressed; Anxious  Affect: Restricted; Congruent   Thought Process  Thought Processes: Linear  Descriptions of Associations: Intact  Orientation: Full  (Time, Place and Person)  Thought Content: Logical  History of Schizophrenia/Schizoaffective disorder: No  Duration of Psychotic Symptoms: NA Hallucinations: Hallucinations: None  Ideas of Reference: None  Suicidal Thoughts: Suicidal Thoughts: No  Homicidal Thoughts: Homicidal Thoughts: No   Sensorium  Memory: Immediate Good; Recent  Good  Judgment: Poor  Insight: Fair   Chartered certified accountant: Fair  Attention Span: Good  Recall: Dudley Major of Knowledge: Good  Language: Good   Psychomotor Activity  Psychomotor Activity: Psychomotor Activity: Restlessness   Assets  Assets: Communication Skills; Desire for Improvement; Resilience   Sleep  Sleep: Sleep: Good   Musculoskeletal: Strength & Muscle Tone: within normal limits Gait & Station: normal Patient leans: N/A   Physical Exam: General: Sitting comfortably. NAD. HEENT: Normocephalic, atraumatic, MMM, EMOI Lungs: no increased work of breathing noted Heart: no cyanosis Abdomen: Non distended Musculoskeletal: FROM. No obvious deformities Skin: Warm, dry, intact. No rashes noted Neuro: No obvious focal deficits.  Gait and station are normal  Review of Systems  Constitutional: Negative.   HENT: Negative.    Eyes: Negative.   Respiratory: Negative.    Cardiovascular: Negative.   Gastrointestinal: Negative.   Genitourinary: Negative.   Skin: Negative.   Neurological: Negative.   Psychiatric/Behavioral:  Positive for depression, anxiety, withdrawal symptoms.     Blood pressure 111/72, pulse 75, temperature (!) 97.5 F (36.4 C), temperature source Oral, resp. rate 16, height 5\' 8"  (1.727 m), weight 79.4 kg, SpO2 100%. Body mass index is 26.61 kg/m.  ASSESSMENT: Xavier Owens is an 53 y.o. male who  has a past medical history of Alcoholic (HCC), Hypertension, and Seizures (HCC).  He presented on 01/25/2024  2:21 PM for Major depressive disorder, recurrent severe without psychotic  features (HCC).  Self-presented to the emergency room for detox. History of alcohol withdrawal seizures and was managed medically before being transferred to our unit.   Diagnoses / Active Problems: Patient Active Problem List   Diagnosis Date Noted   Major depressive disorder, recurrent severe without psychotic features (HCC) 01/25/2024   Alcohol withdrawal seizure (HCC) 01/23/2024   Alcohol use disorder, severe, dependence (HCC) 01/18/2024   Healthcare maintenance 06/10/2022   Nightmares 12/21/2021   Subclinical hypothyroidism 12/21/2021   Substance induced mood disorder (HCC) 12/14/2021   Alcohol use disorder 06/23/2021   Seizures (HCC) 06/22/2021   Elevated liver enzymes 04/19/2016   Essential hypertension 04/19/2016      PLAN: Safety and Monitoring:  -- Voluntary admission to inpatient psychiatric unit for safety, stabilization and treatment  -- Daily contact with patient to assess and evaluate symptoms and progress in treatment  -- Patient's case to be discussed in multi-disciplinary team meeting  -- Observation Level : q15 minute checks  -- Vital signs:  q12 hours  -- Precautions: suicide, elopement, and assault  2. Psychiatric Diagnoses and Treatment:  Patient Active Problem List   Diagnosis Date Noted   Major depressive disorder, recurrent severe without psychotic features (HCC) 01/25/2024   Alcohol withdrawal seizure (HCC) 01/23/2024   Alcohol use disorder, severe, dependence (HCC) 01/18/2024   Healthcare maintenance 06/10/2022   Nightmares 12/21/2021   Subclinical hypothyroidism 12/21/2021   Substance induced mood disorder (HCC) 12/14/2021   Alcohol use disorder 06/23/2021   Seizures (HCC) 06/22/2021   Elevated liver enzymes 04/19/2016   Essential hypertension 04/19/2016     Scheduled Medications:  chlordiazePOXIDE  5 mg Oral Daily   [START ON 01/31/2024] DULoxetine  30 mg Oral Daily   folic acid  1 mg Oral Daily   gabapentin  600 mg Oral TID   multivitamin  with minerals  1 tablet Oral Daily   propranolol  10 mg Oral BID   QUEtiapine  100 mg Oral QHS   rivaroxaban  10 mg Oral Daily   thiamine  100 mg Oral Daily   Or   thiamine  100 mg Intravenous Daily   traZODone  100 mg Oral QHS   cholecalciferol  2,000 Units Oral Daily    As Needed Medications: acetaminophen, alum & mag hydroxide-simeth, haloperidol **AND** diphenhydrAMINE, haloperidol lactate **AND** diphenhydrAMINE **AND** LORazepam, haloperidol lactate **AND** diphenhydrAMINE **AND** LORazepam, LORazepam, magnesium hydroxide    3. Medical Issues Being Addressed:   -- As above  Labs reviewed, unremarkable with the exception of: Anemia, UDS positive for benzodiazepines, hypertriglyceridemia (165), vitamin D insufficiency (29.42)   4. Discharge Planning:   -- Social work and case management to assist with discharge planning and identification of hospital follow-up needs prior to discharge  -- Estimated LOS: Likely discharge by the end of the week  -- Discharge Concerns: Need to establish a safety plan; Medication compliance and effectiveness  -- Discharge Goals: Return home with outpatient referrals for mental health follow-up including medication management/psychotherapy  5. Short Term Goals:  Improve ability to identify changes in lifestyle to reduce recurrence of condition, verbalize feelings, disclose and discuss suicidal ideas, demonstrate self-control, identify and develop effective coping behaviors, compliance with prescribed medications, identify triggers associated with substance abuse/mental health issues, participate in unit milieu and in scheduled group therapies   6. Long Term Goals: Improvement in symptoms so the patient is ready for discharge   --The risks/benefits/side-effects/alternatives to the medications above were discussed in detail with the patient and time was given for questions. The patient provided informed consent.   -- Metabolic profile and EKG  monitoring obtained while on an atypical antipsychotic and listed in the EHR    Total Time Spent in Direct Patient Care:  I personally spent 35 minutes on the unit in direct patient care. The direct patient care time included face-to-face time with the patient, reviewing the patient's chart, communicating with other professionals, and coordinating care. Greater than 50% of this time was spent in counseling or coordinating care with the patient regarding goals of hospitalization, psycho-education, and discharge planning needs.      Criss Alvine, MD Psychiatrist  01/30/2024, 2:27 PM   I certify that inpatient services furnished can reasonably be expected to improve the patient's condition.    Portions of this note were created using voice recognition software. Minor syntax errors, grammatical content, spelling, or punctuation errors may have occurred unintentionally. Please notify the Thereasa Parkin if the meaning of any statement is unclear.

## 2024-01-30 NOTE — Plan of Care (Signed)
  Problem: Education: Goal: Emotional status will improve Outcome: Progressing Goal: Verbalization of understanding the information provided will improve Outcome: Progressing   Problem: Activity: Goal: Interest or engagement in activities will improve Outcome: Progressing Goal: Sleeping patterns will improve Outcome: Progressing   Problem: Coping: Goal: Ability to verbalize frustrations and anger appropriately will improve Outcome: Progressing

## 2024-01-30 NOTE — Group Note (Signed)
 Recreation Therapy Group Note   Group Topic:Animal Assisted Therapy   Group Date: 01/30/2024 Start Time: 0945 End Time: 1030 Facilitators: Cassara Nida-McCall, LRT,CTRS Location: [address]   Animal-Assisted Activity (AAA) Program Checklist/Progress Notes Patient Eligibility Criteria Checklist & Daily Group note for Rec Tx Intervention  AAA/T Program Assumption of Risk Form signed by Patient/ or Parent Legal Guardian Yes  Patient is free of allergies or severe asthma Yes  Patient reports no fear of animals Yes  Patient reports no history of cruelty to animals Yes  Patient understands his/her participation is voluntary Yes  Patient washes hands before animal contact Yes  Patient washes hands after animal contact Yes  Education: Hand Washing, Appropriate Animal Interaction   Education Outcome: Acknowledges education.    Affect/Mood: Appropriate   Participation Level: Engaged   Participation Quality: Independent   Behavior: Appropriate   Speech/Thought Process: Focused   Insight: Good   Judgement: Good   Modes of Intervention: Teaching laboratory technician   Patient Response to Interventions:  Engaged   Education Outcome:  In group clarification offered    Clinical Observations/Individualized Feedback: Patient attended session and interacted appropriately with therapy dog and peers. Patient asked appropriate questions about therapy dog and his training. Patient shared stories about their pets at home with group.      Plan: Continue to engage patient in RT group sessions 2-3x/week.   Mavin Dyke-McCall, LRT,CTRS 01/30/2024 1:28 PM

## 2024-01-31 ENCOUNTER — Encounter (HOSPITAL_COMMUNITY): Payer: Self-pay

## 2024-01-31 DIAGNOSIS — F332 Major depressive disorder, recurrent severe without psychotic features: Secondary | ICD-10-CM | POA: Diagnosis not present

## 2024-01-31 MED ORDER — DULOXETINE HCL 20 MG PO CPEP
40.0000 mg | ORAL_CAPSULE | Freq: Every day | ORAL | Status: DC
  Administered 2024-02-01: 40 mg via ORAL
  Filled 2024-01-31 (×2): qty 2

## 2024-01-31 MED ORDER — QUETIAPINE FUMARATE 50 MG PO TABS
150.0000 mg | ORAL_TABLET | Freq: Every day | ORAL | Status: DC
  Administered 2024-01-31 – 2024-02-01 (×2): 150 mg via ORAL
  Filled 2024-01-31 (×3): qty 3

## 2024-01-31 MED ORDER — TRAZODONE HCL 50 MG PO TABS
50.0000 mg | ORAL_TABLET | Freq: Every day | ORAL | Status: DC
Start: 2024-01-31 — End: 2024-02-01
  Administered 2024-01-31: 50 mg via ORAL
  Filled 2024-01-31 (×2): qty 1

## 2024-01-31 NOTE — [facility] Group Notes (Signed)
 [facility] Group Notes:  (Nursing/MHT/Case Management/Adjunct)  Date:  01/31/2024  Time:  8:20 PM  Type of Therapy:   NA Group  Participation Level:  Active  Participation Quality:  Appropriate  Affect:  Appropriate  Cognitive:  Appropriate  Insight:  Appropriate  Engagement in Group:  Engaged  Modes of Intervention:  Education  Summary of Progress/Problems:  Attended NA meeting.  Xavier Owens 01/31/2024, 8:20 PM

## 2024-01-31 NOTE — Plan of Care (Signed)
  Problem: Activity: Goal: Interest or engagement in activities will improve Outcome: Progressing Goal: Sleeping patterns will improve Outcome: Progressing   Problem: Safety: Goal: Periods of time without injury will increase Outcome: Progressing

## 2024-01-31 NOTE — Group Note (Signed)
 Recreation Therapy Group Note   Group Topic:Other  Group Date: 01/31/2024 Start Time: 1405 End Time: 1450 Facilitators: Cammy Sanjurjo-McCall, LRT,CTRS Location: [address]   Activity Description/Intervention: Therapeutic Drumming. Patients with peers and staff were given the opportunity to engage in a leader facilitated [facility] Group Empowerment Drumming Circle with staff from the [employer], in partnership with [employer]. Teaching laboratory technician and trained [employer], Theodoro Doing leading with LRT observing and documenting intervention and pt response. This evidenced-based practice targets 7 areas of health and wellbeing in the human experience including: stress-reduction, exercise, self-expression, camaraderie/support, nurturing, spirituality, and music-making (leisure).   Goal Area(s) Addresses:  Patient will engage in pro-social way in music group.  Patient will follow directions of drum leader on the first prompt. Patient will demonstrate no behavioral issues during group.  Patient will identify if a reduction in stress level occurs as a result of participation in therapeutic drum circle.    Education: Leisure exposure, Pharmacologist, Musical expression, Discharge Planning   Affect/Mood: Appropriate   Participation Level: Engaged   Participation Quality: Independent   Behavior: Appropriate   Speech/Thought Process: Focused   Insight: Good   Judgement: Good   Modes of Intervention: Teaching laboratory technician   Patient Response to Interventions:  Engaged   Education Outcome:  In group clarification offered    Clinical Observations/Individualized Feedback: [name] actively engaged in therapeutic drumming exercise and discussions. Pt was appropriate with peers, staff, and musical equipment for duration of programming.  Pt identified "vibrated" as their feeling after participation in music-based programming. Pt affect congruent/incongruent with  verbalized emotion.     Plan: Continue to engage patient in RT group sessions 2-3x/week.   [name], LRT,CTRS 01/31/2024 4:08 PM

## 2024-01-31 NOTE — Group Note (Signed)
 Date:  01/31/2024 Time:  12:48 PM  Group Topic/Focus:  Dimensions of Wellness:   The focus of this group is to introduce the topic of physical wellness and discuss its impact on mental health.     Participation Level:  Active  Participation Quality:  Appropriate  Affect:  Appropriate  Cognitive:  Appropriate  Insight: Appropriate  Engagement in Group:  Developing/Improving  Modes of Intervention:  Discussion and Education  Additional Comments:    Arnoldo Hooker 01/31/2024, 12:48 PM

## 2024-01-31 NOTE — [facility] Group Notes (Signed)
 Group Topic: Activities that bring joy/memories  Goals: Reignite passions that may be significant sources of hope/possibility, reawaken memories that we hold dear that may also be part of healing grief, consider new traditions/values and reflect on what is "spiritual" about doing things that bring [place] joy, fulfillment, and peace.  Theoretical basis: All group dynamics rooted in group psychotherapeutic approaches of Chyrl Civatte, also Rogerian and Relational Cultural Therapeutic concepts that aim to foster relational support, mutual empathy, and unconditional positive regard/acceptance.  Observations: Xavier Owens was an active participant in the group discussion.  Lillias Difrancesco L. Sophronia Simas, M.Div [phone]

## 2024-01-31 NOTE — BH IP Treatment Plan (Signed)
 Interdisciplinary Treatment and Diagnostic Plan Update  01/31/2024 Time of Session: 1205 - update Xavier Owens MRN: [id number]  Principal Diagnosis: Major depressive disorder, recurrent severe without psychotic features (HCC)  Secondary Diagnoses: Principal Problem:   Major depressive disorder, recurrent severe without psychotic features (HCC) Active Problems:   Alcohol use disorder   Current Medications:  Current Facility-Administered Medications  Medication Dose Route Frequency Provider Last Rate Last Admin   acetaminophen (TYLENOL) tablet 650 mg  650 mg Oral Q6H PRN Maryagnes Amos, FNP   650 mg at 01/30/24 2120   alum & mag hydroxide-simeth (MAALOX/MYLANTA) 200-200-20 MG/5ML suspension 30 mL  30 mL Oral Q4H PRN Starkes-Perry, Juel Burrow, FNP       haloperidol (HALDOL) tablet 5 mg  5 mg Oral TID PRN Maryagnes Amos, FNP       And   diphenhydrAMINE (BENADRYL) capsule 50 mg  50 mg Oral TID PRN Maryagnes Amos, FNP       haloperidol lactate (HALDOL) injection 5 mg  5 mg Intramuscular TID PRN Massengill, Harrold Donath, MD       And   diphenhydrAMINE (BENADRYL) injection 50 mg  50 mg Intramuscular TID PRN Massengill, Harrold Donath, MD       And   LORazepam (ATIVAN) injection 2 mg  2 mg Intramuscular TID PRN Massengill, Harrold Donath, MD       haloperidol lactate (HALDOL) injection 10 mg  10 mg Intramuscular TID PRN Massengill, Harrold Donath, MD       And   diphenhydrAMINE (BENADRYL) injection 50 mg  50 mg Intramuscular TID PRN Massengill, Harrold Donath, MD       And   LORazepam (ATIVAN) injection 2 mg  2 mg Intramuscular TID PRN Massengill, Harrold Donath, MD       Melene Muller ON 02/01/2024] DULoxetine (CYMBALTA) DR capsule 40 mg  40 mg Oral Daily Golda Acre, MD       folic acid (FOLVITE) tablet 1 mg  1 mg Oral Daily Maryagnes Amos, FNP   1 mg at 01/31/24 0756   gabapentin (NEURONTIN) capsule 600 mg  600 mg Oral TID Maryagnes Amos, FNP   600 mg at 01/31/24 1144   LORazepam (ATIVAN) tablet 1  mg  1 mg Oral Q6H PRN Massengill, Harrold Donath, MD   1 mg at 01/29/24 0944   magnesium hydroxide (MILK OF MAGNESIA) suspension 30 mL  30 mL Oral Daily PRN Maryagnes Amos, FNP       multivitamin with minerals tablet 1 tablet  1 tablet Oral Daily Maryagnes Amos, FNP   1 tablet at 01/31/24 0756   propranolol (INDERAL) tablet 10 mg  10 mg Oral BID Maryagnes Amos, FNP   10 mg at 01/31/24 0756   QUEtiapine (SEROQUEL) tablet 150 mg  150 mg Oral QHS Golda Acre, MD       rivaroxaban Carlena Hurl) tablet 10 mg  10 mg Oral Daily Maryagnes Amos, FNP   10 mg at 01/31/24 0756   thiamine (Vitamin B-1) tablet 100 mg  100 mg Oral Daily Maryagnes Amos, FNP   100 mg at 01/31/24 0756   traZODone (DESYREL) tablet 50 mg  50 mg Oral QHS Golda Acre, MD       vitamin D3 (CHOLECALCIFEROL) tablet 2,000 Units  2,000 Units Oral Daily Golda Acre, MD   2,000 Units at 01/31/24 0756   PTA Medications: Medications Prior to Admission  Medication Sig Dispense Refill Last Dose/Taking   chlordiazePOXIDE (LIBRIUM) 25 MG capsule 50mg   PO TID x 1D, then 25-50mg  PO BID X 1D, then 25-50mg  PO QD X 1D 10 capsule 0    DULoxetine (CYMBALTA) 20 MG capsule Take 1 capsule (20 mg total) by mouth daily.      folic acid (FOLVITE) 1 MG tablet Take 1 tablet (1 mg total) by mouth daily.      gabapentin (NEURONTIN) 300 MG capsule Take 2 capsules (600 mg total) by mouth 3 (three) times daily.      Multiple Vitamin (MULTIVITAMIN WITH MINERALS) TABS tablet Take 1 tablet by mouth daily.      propranolol (INDERAL) 10 MG tablet Take 1 tablet (10 mg total) by mouth 2 (two) times daily.      QUEtiapine (SEROQUEL) 100 MG tablet Take 1 tablet (100 mg total) by mouth at bedtime. 90 tablet 1    thiamine (VITAMIN B1) 100 MG tablet Take 100 mg by mouth daily.      traZODone (DESYREL) 100 MG tablet Take 100 mg by mouth at bedtime.       Patient Stressors:    Patient Strengths:    Treatment Modalities: Medication  Management, Group therapy, Case management,  1 to 1 session with clinician, Psychoeducation, Recreational therapy.   Physician Treatment Plan for Primary Diagnosis: Major depressive disorder, recurrent severe without psychotic features (HCC) Long Term Goal(s): Improvement in symptoms so as ready for discharge   Short Term Goals: Maintenance of sobriety  Medication Management: Evaluate patient's response, side effects, and tolerance of medication regimen.  Therapeutic Interventions: 1 to 1 sessions, Unit Group sessions and Medication administration.  Evaluation of Outcomes: Progressing  Physician Treatment Plan for Secondary Diagnosis: Principal Problem:   Major depressive disorder, recurrent severe without psychotic features (HCC) Active Problems:   Alcohol use disorder  Long Term Goal(s): Improvement in symptoms so as ready for discharge   Short Term Goals: Maintenance of sobriety     Medication Management: Evaluate patient's response, side effects, and tolerance of medication regimen.  Therapeutic Interventions: 1 to 1 sessions, Unit Group sessions and Medication administration.  Evaluation of Outcomes: Progressing   RN Treatment Plan for Primary Diagnosis: Major depressive disorder, recurrent severe without psychotic features (HCC) Long Term Goal(s): Knowledge of disease and therapeutic regimen to maintain health will improve  Short Term Goals: Ability to remain free from injury will improve, Ability to verbalize frustration and anger appropriately will improve, Ability to participate in decision making will improve, Ability to disclose and discuss suicidal ideas, and Compliance with prescribed medications will improve  Medication Management: RN will administer medications as ordered by provider, will assess and evaluate patient's response and provide education to patient for prescribed medication. RN will report any adverse and/or side effects to prescribing  provider.  Therapeutic Interventions: 1 on 1 counseling sessions, Psychoeducation, Medication administration, Evaluate responses to treatment, Monitor vital signs and CBGs as ordered, Perform/monitor CIWA, COWS, AIMS and Fall Risk screenings as ordered, Perform wound care treatments as ordered.  Evaluation of Outcomes: Progressing   LCSW Treatment Plan for Primary Diagnosis: Major depressive disorder, recurrent severe without psychotic features (HCC) Long Term Goal(s): Safe transition to appropriate next level of care at discharge, Engage patient in therapeutic group addressing interpersonal concerns.  Short Term Goals: Engage patient in aftercare planning with referrals and resources, Increase social support, Increase emotional regulation, and Facilitate acceptance of mental health diagnosis and concerns  Therapeutic Interventions: Assess for all discharge needs, 1 to 1 time with Social worker, Explore available resources and support systems, Assess for adequacy  in community support network, Educate family and significant other(s) on suicide prevention, Complete Psychosocial Assessment, Interpersonal group therapy.  Evaluation of Outcomes: Progressing   Progress in Treatment: Attending groups: No. Attending some Participating in groups: No. Taking medication as prescribed: Yes. Toleration medication: Yes. Family/Significant other contact made:  Patient declined consents Patient understands diagnosis: Yes. Discussing patient identified problems/goals with staff: Yes. Medical problems stabilized or resolved: Yes. Denies suicidal/homicidal ideation: Yes. Issues/concerns per patient self-inventory: No.   New problem(s) identified:  No   New Short Term/Long Term Goal(s):  detox, medication management for mood stabilization; elimination of SI thoughts; development of comprehensive mental wellness/sobriety plan   Patient Goals:  "get my alcohol use under control."    Discharge Plan or  Barriers:  Patient recently admitted. CSW will continue to follow and assess for appropriate referrals and possible discharge planning.    Reason for Continuation of Hospitalization: Medication stabilization Withdrawals from alcohol   Estimated Length of Stay:  3-4 days  Last 3 [place] Suicide Severity Risk Score: Flowsheet Row Admission (Current) [facility]-Admission (Discharged) from 01/23/2024 in [facility] from 01/18/2024 in [facility]  C-SSRS RISK CATEGORY No Risk No Risk No Risk       Last PHQ 2/9 Scores:    01/23/2024   10:12 AM 01/18/2024   12:26 AM 06/10/2022    9:19 AM  Depression screen PHQ 2/9  Decreased Interest 0 0 0  Down, Depressed, Hopeless 0 3 0  PHQ - 2 Score 0 3 0  Altered sleeping 1 3   Tired, decreased energy 1 0   Change in appetite 0 3   Feeling bad or failure about yourself  0 3   Trouble concentrating 1 3   Moving slowly or fidgety/restless 0 0   Suicidal thoughts 0 0   PHQ-9 Score 3 15   Difficult doing work/chores  Extremely dIfficult     Scribe for Treatment Team: Kathi Der, LCSWA 01/31/2024 1:54 PM

## 2024-01-31 NOTE — Progress Notes (Signed)
 [facility] MD Progress Note  01/31/2024 12:47 PM Xavier Owens  MRN:  [id number] Principal Problem: Major depressive disorder, recurrent severe without psychotic features (HCC) Diagnosis: Principal Problem:   Major depressive disorder, recurrent severe without psychotic features (HCC) Active Problems:   Alcohol use disorder   ID & Admission Information: Xavier Owens is an 53 y.o. male who  has a past medical history of Alcoholic (HCC), Hypertension, and Seizures (HCC).  He presented on 01/25/2024  2:21 PM for Major depressive disorder, recurrent severe without psychotic features (HCC). Self-presented to the emergency room for detox. History of alcohol withdrawal seizures and was managed medically before being transferred to our unit.   Subjective:   Case was discussed in the multidisciplinary team. MAR was reviewed and patient was compliant with medications. He continued to endorse tremors and anxiety ON.  PRN's in last 24 hours: Tylenol 650 mg administered for chronic pain  Patient was seen in his room during rounds.  He continues to report subjectively depressed mood and relatively high anxiety.  He described both as "10 out of 10" today.  He disclosed that one of his biggest stressors is his significant other's mental health.  He reports that she is on chronic opioid therapy and taking the opioids at a higher than prescribed rate.  He reports that she is running out of her medication about 15 days before she should.  I advised the patient to report the overuse to the patient's physician, as this is a potentially dangerous situation.  The patient also disclosed that the wife frequently makes suicidal threats, which is also causing him a great deal of distress.  I advised the patient to contact the magistrate or mobile crisis if he is concerned for her safety.  He acknowledged understanding of the process.  We discussed continued titration of duloxetine for mood, anxiety, and hopeful relief to  chronic pain.  We also discussed titrating Seroquel with the goal of discontinuing trazodone to reduce polypharmacy.   Past Psychiatric and Medical Medical History:  Past Medical History:  Diagnosis Date   Alcoholic (HCC)    Hypertension    Seizures (HCC)     Long history of alcohol use disorder.  No other mental health diagnosis. No past trial of any psychotropic medication. No past inpatient care. No use of any other psychoactive substances other than alcohol. Patient has been in rehab once. No past history of mania.  No past history of psychosis.  No past history of self-injurious behavior.  No past suicidal attempts.  No past history of violent behavior. No past history of neuromodulation.  Past Surgical History:  Procedure Laterality Date   dislocated hip     HAND SURGERY     KNEE SURGERY      Family History(Medical and Psychiatric):  Family History  Problem Relation Age of Onset   Cancer Mother    Cancer Father        Social History:  Social History   Substance and Sexual Activity  Alcohol Use Yes   Comment: "a fifth of vodka every day for 30 years"     Social History   Substance and Sexual Activity  Drug Use No    Social History   Socioeconomic History   Marital status: Married    Spouse name: Not on file   Number of children: Not on file   Years of education: Not on file   Highest education level: Not on file  Occupational History   Not on file  Tobacco Use   Smoking status: Former    Current packs/day: 0.10    Types: Cigarettes   Smokeless tobacco: Never  Vaping Use   Vaping status: Never Used  Substance and Sexual Activity   Alcohol use: Yes    Comment: "a fifth of vodka every day for 30 years"   Drug use: No   Sexual activity: Yes  Other Topics Concern   Not on file  Social History Narrative   Not on file   Social Drivers of Health   Financial Resource Strain: Not on file  Food Insecurity: No Food Insecurity (01/25/2024)   Hunger  Vital Sign    Worried About Running Out of Food in the Last Year: Never true    Ran Out of Food in the Last Year: Never true  Transportation Needs: No Transportation Needs (01/25/2024)   PRAPARE - Administrator, Civil Service (Medical): No    Lack of Transportation (Non-Medical): No  Physical Activity: Not on file  Stress: Not on file  Social Connections: Socially Integrated (01/24/2024)   Social Connection and Isolation Panel [NHANES]    Frequency of Communication with Friends and Family: More than three times a week    Frequency of Social Gatherings with Friends and Family: More than three times a week    Attends Religious Services: More than 4 times per year    Active Member of [employer] or Organizations: Yes    Attends Engineer, structural: More than 4 times per year    Marital Status: Living with partner   Marital status: Single Are you sexually active?: No What is your sexual orientation?: "heterosexual" Has your sexual activity been affected by drugs, alcohol, medication, or emotional stress?: "no" Does patient have children?: Yes How many children?: 2 How is patient's relationship with their children?: "great" Pt reports one of his daughters he did not find out about until later     Current Medications: Current Facility-Administered Medications  Medication Dose Route Frequency Provider Last Rate Last Admin   acetaminophen (TYLENOL) tablet 650 mg  650 mg Oral Q6H PRN Maryagnes Amos, FNP   650 mg at 01/30/24 2120   alum & mag hydroxide-simeth (MAALOX/MYLANTA) 200-200-20 MG/5ML suspension 30 mL  30 mL Oral Q4H PRN Starkes-Perry, Juel Burrow, FNP       haloperidol (HALDOL) tablet 5 mg  5 mg Oral TID PRN Starkes-Perry, Juel Burrow, FNP       And   diphenhydrAMINE (BENADRYL) capsule 50 mg  50 mg Oral TID PRN Starkes-Perry, Juel Burrow, FNP       haloperidol lactate (HALDOL) injection 5 mg  5 mg Intramuscular TID PRN Massengill, Harrold Donath, MD       And   diphenhydrAMINE  (BENADRYL) injection 50 mg  50 mg Intramuscular TID PRN Massengill, Harrold Donath, MD       And   LORazepam (ATIVAN) injection 2 mg  2 mg Intramuscular TID PRN Massengill, Harrold Donath, MD       haloperidol lactate (HALDOL) injection 10 mg  10 mg Intramuscular TID PRN Massengill, Harrold Donath, MD       And   diphenhydrAMINE (BENADRYL) injection 50 mg  50 mg Intramuscular TID PRN Massengill, Harrold Donath, MD       And   LORazepam (ATIVAN) injection 2 mg  2 mg Intramuscular TID PRN Massengill, Harrold Donath, MD       DULoxetine (CYMBALTA) DR capsule 30 mg  30 mg Oral Daily Golda Acre, MD   30 mg at  01/31/24 0756   folic acid (FOLVITE) tablet 1 mg  1 mg Oral Daily Maryagnes Amos, FNP   1 mg at 01/31/24 0756   gabapentin (NEURONTIN) capsule 600 mg  600 mg Oral TID Maryagnes Amos, FNP   600 mg at 01/31/24 1144   LORazepam (ATIVAN) tablet 1 mg  1 mg Oral Q6H PRN Massengill, Harrold Donath, MD   1 mg at 01/29/24 0944   magnesium hydroxide (MILK OF MAGNESIA) suspension 30 mL  30 mL Oral Daily PRN Maryagnes Amos, FNP       multivitamin with minerals tablet 1 tablet  1 tablet Oral Daily Maryagnes Amos, FNP   1 tablet at 01/31/24 0756   propranolol (INDERAL) tablet 10 mg  10 mg Oral BID Maryagnes Amos, FNP   10 mg at 01/31/24 0756   QUEtiapine (SEROQUEL) tablet 100 mg  100 mg Oral QHS Maryagnes Amos, FNP   100 mg at 01/30/24 2119   rivaroxaban (XARELTO) tablet 10 mg  10 mg Oral Daily Maryagnes Amos, FNP   10 mg at 01/31/24 0756   thiamine (Vitamin B-1) tablet 100 mg  100 mg Oral Daily Maryagnes Amos, FNP   100 mg at 01/31/24 7829   Or   thiamine (VITAMIN B1) injection 100 mg  100 mg Intravenous Daily Maryagnes Amos, FNP       traZODone (DESYREL) tablet 100 mg  100 mg Oral QHS Maryagnes Amos, FNP   100 mg at 01/30/24 2119   vitamin D3 (CHOLECALCIFEROL) tablet 2,000 Units  2,000 Units Oral Daily Golda Acre, MD   2,000 Units at 01/31/24 5621    Lab Results:   Results for orders placed or performed during the hospital encounter of 01/25/24 (from the past 48 hours)  Folate     Status: None   Collection Time: 01/29/24  6:49 PM  Result Value Ref Range   Folate 22.8 >5.9 ng/mL    Comment: Performed at [facility], [address] [place], [place] [address]  Hemoglobin A1c     Status: Abnormal   Collection Time: 01/29/24  6:49 PM  Result Value Ref Range   Hgb A1c MFr Bld 4.5 (L) 4.8 - 5.6 %    Comment: (NOTE) Pre diabetes:          5.7%-6.4%  Diabetes:              >6.4%  Glycemic control for   <7.0% adults with diabetes    Mean Plasma Glucose 82.45 mg/dL    Comment: Performed at [facility], [address], [place], [place] [address]  Lipid panel     Status: Abnormal   Collection Time: 01/29/24  6:49 PM  Result Value Ref Range   Cholesterol 174 0 - 200 mg/dL   Triglycerides 629 (H) <150 mg/dL   HDL 50 >52 mg/dL   Total CHOL/HDL Ratio 3.5 RATIO   VLDL 33 0 - 40 mg/dL   LDL Cholesterol 91 0 - 99 mg/dL    Comment:        Total Cholesterol/HDL:CHD Risk Coronary Heart Disease Risk Table                     Men   Women  1/2 Average Risk   3.4   3.3  Average Risk       5.0   4.4  2 X Average Risk   9.6   7.1  3  X Average Risk  23.4   11.0        Use the calculated Patient Ratio above and the CHD Risk Table to determine the patient's CHD Risk.        ATP III CLASSIFICATION (LDL):  <100     mg/dL   Optimal  161-096  mg/dL   Near or Above                    Optimal  130-159  mg/dL   Borderline  045-409  mg/dL   High  >811     mg/dL   Very High Performed at [facility], [address] [place], [place] [address]   RPR     Status: None   Collection Time: 01/29/24  6:49 PM  Result Value Ref Range   RPR Ser Ql NON REACTIVE NON REACTIVE    Comment: Performed at [facility], [address] [place], [place] [address]  TSH     Status: None   Collection Time: 01/29/24  6:49 PM   Result Value Ref Range   TSH 4.117 0.350 - 4.500 uIU/mL    Comment: Performed by a 3rd Generation assay with a functional sensitivity of <=0.01 uIU/mL. Performed at [facility], [address] [place], [place] [address]   Vitamin B12     Status: None   Collection Time: 01/29/24  6:49 PM  Result Value Ref Range   Vitamin B-12 585 180 - 914 pg/mL    Comment: (NOTE) This assay is not validated for testing neonatal or myeloproliferative syndrome specimens for Vitamin B12 levels. Performed at [facility], [address], [place], [place] [address]   VITAMIN D 25 Hydroxy (Vit-D Deficiency, Fractures)     Status: Abnormal   Collection Time: 01/29/24  6:49 PM  Result Value Ref Range   Vit D, 25-Hydroxy 29.42 (L) 30 - 100 ng/mL    Comment: (NOTE) Vitamin D deficiency has been defined by the Institute of Medicine  and an Endocrine Society practice guideline as a level of serum 25-OH  vitamin D less than 20 ng/mL (1,2). The Endocrine Society went on to  further define vitamin D insufficiency as a level between 21 and 29  ng/mL (2).  1. IOM (Institute of Medicine). 2010. Dietary reference intakes for  calcium and D. [place]: [employer]. 2. Holick MF, Binkley , Bischoff-Ferrari HA, et al. Evaluation,  treatment, and prevention of vitamin D deficiency: an Endocrine  Society clinical practice guideline, JCEM. 2011 Jul; 96(7): 1911-30.  Performed at [facility], [address] [place], [place] [address]     Blood Alcohol level:  Lab Results  Component Value Date   [facility] <10 01/23/2024   ETH <10 01/18/2024    Metabolic Disorder Labs: Lab Results  Component Value Date   HGBA1C 4.5 (L) 01/29/2024   MPG 82.45 01/29/2024   MPG 97 12/09/2021   No results found for: "PROLACTIN" Lab Results  Component Value Date   CHOL 174 01/29/2024   TRIG 165 (H) 01/29/2024   HDL 50 01/29/2024   CHOLHDL 3.5 01/29/2024    VLDL 33 01/29/2024   LDLCALC 91 01/29/2024   LDLCALC 117 (H) 12/09/2021    Physical Findings: AIMS:  , ,  ,  ,    CIWA:  CIWA-Ar Total: 3 COWS:     Psychiatric Specialty Exam:  Presentation  General Appearance: Appropriate for Environment  Eye Contact:  Good  Speech: Clear and Coherent  Speech Volume: Decreased  Handedness: Right   Mood and Affect  Mood: Depressed; Anxious  Affect: Restricted; Congruent   Thought Process  Thought Processes: Linear  Descriptions of Associations: Intact  Orientation: Full (Time, Place and Person)  Thought Content: Logical  History of Schizophrenia/Schizoaffective disorder: No  Duration of Psychotic Symptoms: NA Hallucinations: Hallucinations: None  Ideas of Reference: None  Suicidal Thoughts: Suicidal Thoughts: No  Homicidal Thoughts: Homicidal Thoughts: No   Sensorium  Memory: Immediate Good; Recent Good  Judgment: Poor  Insight: Fair   Art therapist  Concentration: Fair  Attention Span: Good  Recall: Good  Fund of Knowledge: Good  Language: Good   Psychomotor Activity  Psychomotor Activity: Psychomotor Activity: Restlessness   Assets  Assets: Communication Skills; Desire for Improvement; Resilience   Sleep  Sleep: Sleep: Good   Musculoskeletal: Strength & Muscle Tone: within normal limits Gait & Station: normal Patient leans: N/A   Physical Exam: General: Sitting comfortably. NAD. HEENT: Normocephalic, atraumatic, MMM, EMOI Lungs: no increased work of breathing noted Heart: no cyanosis Abdomen: Non distended Musculoskeletal: FROM. No obvious deformities Skin: Warm, dry, intact. No rashes noted Neuro: No obvious focal deficits.  Gait and station are normal  Review of Systems  Constitutional: Negative.   HENT: Negative.    Eyes: Negative.   Respiratory: Negative.    Cardiovascular: Negative.   Gastrointestinal: Negative.   Genitourinary: Negative.   Skin: Negative.    Neurological: Negative.   Psychiatric/Behavioral:  Positive for depression, anxiety, withdrawal symptoms.     Blood pressure 102/73, pulse 81, temperature 98.4 F (36.9 C), temperature source Oral, resp. rate 16, height 5\' 8"  (1.727 m), weight 79.4 kg, SpO2 98%. Body mass index is 26.61 kg/m.  ASSESSMENT: Xavier Owens is an 53 y.o. male who  has a past medical history of Alcoholic (HCC), Hypertension, and Seizures (HCC).  He presented on 01/25/2024  2:21 PM for Major depressive disorder, recurrent severe without psychotic features (HCC).  Self-presented to the emergency room for detox. History of alcohol withdrawal seizures and was managed medically before being transferred to our unit.   Diagnoses / Active Problems: Patient Active Problem List   Diagnosis Date Noted   Major depressive disorder, recurrent severe without psychotic features (HCC) 01/25/2024   Alcohol withdrawal seizure (HCC) 01/23/2024   Alcohol use disorder, severe, dependence (HCC) 01/18/2024   Healthcare maintenance 06/10/2022   Nightmares 12/21/2021   Subclinical hypothyroidism 12/21/2021   Substance induced mood disorder (HCC) 12/14/2021   Alcohol use disorder 06/23/2021   Seizures (HCC) 06/22/2021   Elevated liver enzymes 04/19/2016   Essential hypertension 04/19/2016      PLAN: Safety and Monitoring:  -- Voluntary admission to inpatient psychiatric unit for safety, stabilization and treatment  -- Daily contact with patient to assess and evaluate symptoms and progress in treatment  -- Patient's case to be discussed in multi-disciplinary team meeting  -- Observation Level : q15 minute checks  -- Vital signs:  q12 hours  -- Precautions: suicide, elopement, and assault  2. Psychiatric Diagnoses and Treatment:  Patient Active Problem List   Diagnosis Date Noted   Major depressive disorder, recurrent severe without psychotic features (HCC) 01/25/2024   Alcohol withdrawal seizure (HCC) 01/23/2024   Alcohol  use disorder, severe, dependence (HCC) 01/18/2024   Healthcare maintenance 06/10/2022   Nightmares 12/21/2021   Subclinical hypothyroidism 12/21/2021   Substance induced mood disorder (HCC) 12/14/2021   Alcohol use disorder 06/23/2021   Seizures (HCC) 06/22/2021  Elevated liver enzymes 04/19/2016   Essential hypertension 04/19/2016     Scheduled Medications:  DULoxetine  30 mg Oral Daily   folic acid  1 mg Oral Daily   gabapentin  600 mg Oral TID   multivitamin with minerals  1 tablet Oral Daily   propranolol  10 mg Oral BID   QUEtiapine  100 mg Oral QHS   rivaroxaban  10 mg Oral Daily   thiamine  100 mg Oral Daily   Or   thiamine  100 mg Intravenous Daily   traZODone  100 mg Oral QHS   cholecalciferol  2,000 Units Oral Daily    As Needed Medications: acetaminophen, alum & mag hydroxide-simeth, haloperidol **AND** diphenhydrAMINE, haloperidol lactate **AND** diphenhydrAMINE **AND** LORazepam, haloperidol lactate **AND** diphenhydrAMINE **AND** LORazepam, LORazepam, magnesium hydroxide    3. Medical Issues Being Addressed:   -- As above  Labs reviewed, unremarkable with the exception of: Anemia, UDS positive for benzodiazepines, hypertriglyceridemia (165), vitamin D insufficiency (29.42)   4. Discharge Planning:   -- Social work and case management to assist with discharge planning and identification of hospital follow-up needs prior to discharge  -- Estimated LOS: Likely discharge by the end of the week  -- Discharge Concerns: Need to establish a safety plan; Medication compliance and effectiveness  -- Discharge Goals: Return home with outpatient referrals for mental health follow-up including medication management/psychotherapy  5. Short Term Goals:  Improve ability to identify changes in lifestyle to reduce recurrence of condition, verbalize feelings, disclose and discuss suicidal ideas, demonstrate self-control, identify and develop effective coping behaviors,  compliance with prescribed medications, identify triggers associated with substance abuse/mental health issues, participate in unit milieu and in scheduled group therapies   6. Long Term Goals: Improvement in symptoms so the patient is ready for discharge   --The risks/benefits/side-effects/alternatives to the medications above were discussed in detail with the patient and time was given for questions. The patient provided informed consent.   -- Metabolic profile and EKG monitoring obtained while on an atypical antipsychotic and listed in the EHR    Total Time Spent in Direct Patient Care:  I personally spent 35 minutes on the unit in direct patient care. The direct patient care time included face-to-face time with the patient, reviewing the patient's chart, communicating with other professionals, and coordinating care. Greater than 50% of this time was spent in counseling or coordinating care with the patient regarding goals of hospitalization, psycho-education, and discharge planning needs.      Criss Alvine, MD Psychiatrist  01/31/2024, 12:47 PM   I certify that inpatient services furnished can reasonably be expected to improve the patient's condition.    Portions of this note were created using voice recognition software. Minor syntax errors, grammatical content, spelling, or punctuation errors may have occurred unintentionally. Please notify the Thereasa Parkin if the meaning of any statement is unclear.

## 2024-01-31 NOTE — Group Note (Signed)
 Date:  01/31/2024 Time:  1:13 PM  Group Topic/Focus:  Goals Group:   The focus of this group is to help patients establish daily goals to achieve during treatment and discuss how the patient can incorporate goal setting into their daily lives to aide in recovery. Orientation:   The focus of this group is to educate the patient on the purpose and policies of crisis stabilization and provide a format to answer questions about their admission.  The group details unit policies and expectations of patients while admitted.    Participation Level:  Did Not Attend   Xavier Owens 01/31/2024, 1:13 PM

## 2024-01-31 NOTE — Progress Notes (Signed)
   01/31/24 0756  Psych Admission Type (Psych Patients Only)  Admission Status Voluntary  Psychosocial Assessment  Patient Complaints Anxiety;Depression  Eye Contact Fair  Facial Expression Animated  Affect Appropriate to circumstance  Speech Logical/coherent  Interaction Assertive  Motor Activity Tremors  Appearance/Hygiene Unremarkable  Behavior Characteristics Appropriate to situation  Mood Pleasant  Thought Process  Coherency WDL  Content WDL  Delusions None reported or observed  Perception WDL  Hallucination None reported or observed  Judgment Poor  Confusion None  Danger to Self  Current suicidal ideation? Denies  Agreement Not to Harm Self Yes  Description of Agreement verbal  Danger to Others  Danger to Others None reported or observed

## 2024-02-01 DIAGNOSIS — F332 Major depressive disorder, recurrent severe without psychotic features: Secondary | ICD-10-CM | POA: Diagnosis not present

## 2024-02-01 LAB — T4, FREE: Free T4: 0.74 ng/dL (ref 0.61–1.12)

## 2024-02-01 MED ORDER — DULOXETINE HCL 60 MG PO CPEP
60.0000 mg | ORAL_CAPSULE | Freq: Every day | ORAL | Status: DC
  Administered 2024-02-02 – 2024-02-03 (×2): 60 mg via ORAL
  Filled 2024-02-01 (×3): qty 1

## 2024-02-01 MED ORDER — TRAZODONE HCL 50 MG PO TABS
50.0000 mg | ORAL_TABLET | Freq: Every day | ORAL | Status: DC
  Administered 2024-02-01: 50 mg via ORAL
  Filled 2024-02-01 (×3): qty 1

## 2024-02-01 MED ORDER — NALTREXONE HCL 50 MG PO TABS
25.0000 mg | ORAL_TABLET | Freq: Every day | ORAL | Status: DC
  Administered 2024-02-01 – 2024-02-02 (×2): 25 mg via ORAL
  Filled 2024-02-01 (×4): qty 1

## 2024-02-01 NOTE — Progress Notes (Signed)
   02/01/24 0900  Psych Admission Type (Psych Patients Only)  Admission Status Voluntary  Psychosocial Assessment  Patient Complaints Anxiety  Eye Contact Fair  Facial Expression Animated  Affect Appropriate to circumstance  Speech Logical/coherent  Interaction Assertive  Motor Activity Tremors  Appearance/Hygiene Unremarkable  Behavior Characteristics Appropriate to situation;Cooperative  Mood Pleasant  Thought Process  Coherency WDL  Content WDL  Delusions None reported or observed  Perception WDL  Hallucination None reported or observed  Judgment Poor  Confusion None  Danger to Self  Current suicidal ideation? Denies  Danger to Others  Danger to Others None reported or observed

## 2024-02-01 NOTE — Group Note (Signed)
 LCSW Group Therapy Note   Group Date: 02/01/2024 Start Time: 1100 End Time: 1200  Participation:  patient was present.  He was respectful and listened but didn't participate in the discussion.  Type of Therapy:  Group Therapy  Title:  Healing From Within: Understanding Our Past, Building Our Future  Objective:  To help participants understand the impact of early experiences on mental and physical health, with a focus on Adverse Childhood Experiences (ACEs), and to explore ways to build resilience and healing.  Group Goals: Understand ACEs and Their Impact: Learn how childhood experiences shape mental and physical health. Build Resilience: Develop strategies for overcoming challenges and creating positive change. Promote Healing: Recognize the value of support and the possibility of healing through therapy and self-care.  Summary: In today's session, we discussed how early experiences, especially ACEs, impact mental and physical health. We explored the effects of stress, abuse, and neglect on brain development and well-being. The group focused on resilience, understanding that healing and positive change are possible with support and self-awareness.  Therapeutic Modalities Used: Psychoeducation: Sharing information about ACEs and their effects. Cognitive Behavioral Therapy (CBT): Helping reframe negative thought patterns. Trauma-Informed Therapy: Creating a safe, supportive space for healing.    Alla Feeling, LCSWA 02/01/2024  5:55 PM

## 2024-02-01 NOTE — Progress Notes (Signed)
 [facility] MD Progress Note  02/01/2024 11:43 AM Xavier Owens  MRN:  [id number] Principal Problem: Major depressive disorder, recurrent severe without psychotic features (HCC) Diagnosis: Principal Problem:   Major depressive disorder, recurrent severe without psychotic features (HCC) Active Problems:   Alcohol use disorder   ID & Admission Information: Xavier Owens is an 53 y.o. male who  has a past medical history of Alcoholic (HCC), Hypertension, and Seizures (HCC).  He presented on 01/25/2024  2:21 PM for Major depressive disorder, recurrent severe without psychotic features (HCC). Self-presented to the emergency room for detox. History of alcohol withdrawal seizures and was managed medically before being transferred to our unit.   Subjective:   Case was discussed in the multidisciplinary team. MAR was reviewed and patient was compliant with medications. He continued to endorse tremors and anxiety ON.  The patient was seen in his room during rounds.  He continues to complain of tremor, depressed mood, and anxiety, which have been ongoing complaints.  He denies suicidal or homicidal ideation.  He denies auditory or visual hallucinations.  He denies medication side effects.  We discussed starting naltrexone for alcohol use disorder.  Will increase Cymbalta for depression, anxiety, and chronic pain.  We also discussed increasing propranolol to help with tremor, but will monitor response to naltrexone and increase duloxetine before adjusting propranolol.   Past Psychiatric and Medical Medical History:  Past Medical History:  Diagnosis Date   Alcoholic (HCC)    Hypertension    Seizures (HCC)     Long history of alcohol use disorder.  No other mental health diagnosis. No past trial of any psychotropic medication. No past inpatient care. No use of any other psychoactive substances other than alcohol. Patient has been in rehab once. No past history of mania.  No past history of psychosis.   No past history of self-injurious behavior.  No past suicidal attempts.  No past history of violent behavior. No past history of neuromodulation.  Past Surgical History:  Procedure Laterality Date   dislocated hip     HAND SURGERY     KNEE SURGERY      Family History(Medical and Psychiatric):  Family History  Problem Relation Age of Onset   Cancer Mother    Cancer Father        Social History:  Social History   Substance and Sexual Activity  Alcohol Use Yes   Comment: "a fifth of vodka every day for 30 years"     Social History   Substance and Sexual Activity  Drug Use No    Social History   Socioeconomic History   Marital status: Married    Spouse name: Not on file   Number of children: Not on file   Years of education: Not on file   Highest education level: Not on file  Occupational History   Not on file  Tobacco Use   Smoking status: Former    Current packs/day: 0.10    Types: Cigarettes   Smokeless tobacco: Never  Vaping Use   Vaping status: Never Used  Substance and Sexual Activity   Alcohol use: Yes    Comment: "a fifth of vodka every day for 30 years"   Drug use: No   Sexual activity: Yes  Other Topics Concern   Not on file  Social History Narrative   Not on file   Social Drivers of Health   Financial Resource Strain: Not on file  Food Insecurity: No Food Insecurity (01/25/2024)   Hunger Vital  Sign    Worried About Programme researcher, broadcasting/film/video in the Last Year: Never true    Ran Out of Food in the Last Year: Never true  Transportation Needs: No Transportation Needs (01/25/2024)   PRAPARE - Administrator, Civil Service (Medical): No    Lack of Transportation (Non-Medical): No  Physical Activity: Not on file  Stress: Not on file  Social Connections: Socially Integrated (01/24/2024)   Social Connection and Isolation Panel [NHANES]    Frequency of Communication with Friends and Family: More than three times a week    Frequency of Social  Gatherings with Friends and Family: More than three times a week    Attends Religious Services: More than 4 times per year    Active Member of [employer] or Organizations: Yes    Attends Engineer, structural: More than 4 times per year    Marital Status: Living with partner   Marital status: Single Are you sexually active?: No What is your sexual orientation?: "heterosexual" Has your sexual activity been affected by drugs, alcohol, medication, or emotional stress?: "no" Does patient have children?: Yes How many children?: 2 How is patient's relationship with their children?: "great" Pt reports one of his daughters he did not find out about until later     Current Medications: Current Facility-Administered Medications  Medication Dose Route Frequency Provider Last Rate Last Admin   acetaminophen (TYLENOL) tablet 650 mg  650 mg Oral Q6H PRN Maryagnes Amos, FNP   650 mg at 01/31/24 2108   alum & mag hydroxide-simeth (MAALOX/MYLANTA) 200-200-20 MG/5ML suspension 30 mL  30 mL Oral Q4H PRN Starkes-Perry, Juel Burrow, FNP       haloperidol (HALDOL) tablet 5 mg  5 mg Oral TID PRN Starkes-Perry, Juel Burrow, FNP       And   diphenhydrAMINE (BENADRYL) capsule 50 mg  50 mg Oral TID PRN Starkes-Perry, Juel Burrow, FNP       haloperidol lactate (HALDOL) injection 5 mg  5 mg Intramuscular TID PRN Massengill, Harrold Donath, MD       And   diphenhydrAMINE (BENADRYL) injection 50 mg  50 mg Intramuscular TID PRN Massengill, Harrold Donath, MD       And   LORazepam (ATIVAN) injection 2 mg  2 mg Intramuscular TID PRN Massengill, Harrold Donath, MD       haloperidol lactate (HALDOL) injection 10 mg  10 mg Intramuscular TID PRN Massengill, Harrold Donath, MD       And   diphenhydrAMINE (BENADRYL) injection 50 mg  50 mg Intramuscular TID PRN Massengill, Harrold Donath, MD       And   LORazepam (ATIVAN) injection 2 mg  2 mg Intramuscular TID PRN Massengill, Harrold Donath, MD       Melene Muller ON 02/02/2024] DULoxetine (CYMBALTA) DR capsule 60 mg  60 mg Oral  Daily Golda Acre, MD       folic acid (FOLVITE) tablet 1 mg  1 mg Oral Daily Maryagnes Amos, FNP   1 mg at 02/01/24 0800   gabapentin (NEURONTIN) capsule 600 mg  600 mg Oral TID Maryagnes Amos, FNP   600 mg at 02/01/24 0759   LORazepam (ATIVAN) tablet 1 mg  1 mg Oral Q6H PRN Massengill, Harrold Donath, MD   1 mg at 01/29/24 0944   magnesium hydroxide (MILK OF MAGNESIA) suspension 30 mL  30 mL Oral Daily PRN Maryagnes Amos, FNP       multivitamin with minerals tablet 1 tablet  1 tablet Oral  Daily Maryagnes Amos, FNP   1 tablet at 02/01/24 0800   naltrexone (DEPADE) tablet 25 mg  25 mg Oral Daily Golda Acre, MD       propranolol (INDERAL) tablet 10 mg  10 mg Oral BID Maryagnes Amos, FNP   10 mg at 02/01/24 0800   QUEtiapine (SEROQUEL) tablet 150 mg  150 mg Oral QHS Golda Acre, MD   150 mg at 01/31/24 2107   rivaroxaban (XARELTO) tablet 10 mg  10 mg Oral Daily Maryagnes Amos, FNP   10 mg at 02/01/24 0800   thiamine (Vitamin B-1) tablet 100 mg  100 mg Oral Daily Maryagnes Amos, FNP   100 mg at 02/01/24 0800   vitamin D3 (CHOLECALCIFEROL) tablet 2,000 Units  2,000 Units Oral Daily Golda Acre, MD   2,000 Units at 02/01/24 0800    Lab Results:  No results found for this or any previous visit (from the past 48 hours).   Blood Alcohol level:  Lab Results  Component Value Date   ETH <10 01/23/2024   ETH <10 01/18/2024    Metabolic Disorder Labs: Lab Results  Component Value Date   HGBA1C 4.5 (L) 01/29/2024   MPG 82.45 01/29/2024   MPG 97 12/09/2021   No results found for: "PROLACTIN" Lab Results  Component Value Date   CHOL 174 01/29/2024   TRIG 165 (H) 01/29/2024   HDL 50 01/29/2024   CHOLHDL 3.5 01/29/2024   VLDL 33 01/29/2024   LDLCALC 91 01/29/2024   LDLCALC 117 (H) 12/09/2021    Physical Findings: AIMS:  , ,  ,  ,    CIWA:  CIWA-Ar Total: 7 COWS:     Psychiatric Specialty Exam:  Presentation  General  Appearance: Fairly Groomed  Eye Contact: Good  Speech: Slow  Speech Volume: Decreased  Handedness: Right   Mood and Affect  Mood: Depressed; Anxious  Affect: Restricted; Congruent   Thought Process  Thought Processes: Linear  Descriptions of Associations: Intact  Orientation: Full (Time, Place and Person)  Thought Content: Logical  History of Schizophrenia/Schizoaffective disorder: No  Duration of Psychotic Symptoms: NA Hallucinations: Hallucinations: None  Ideas of Reference: None  Suicidal Thoughts: Suicidal Thoughts: No  Homicidal Thoughts: Homicidal Thoughts: No   Sensorium  Memory: Immediate Good; Recent Good  Judgment: Fair  Insight: Good   Executive Functions  Concentration: Fair  Attention Span: Good  Recall: Good  Fund of Knowledge: Good  Language: Good   Psychomotor Activity  Psychomotor Activity: Psychomotor Activity: Restlessness   Assets  Assets: Communication Skills; Desire for Improvement; Housing   Sleep  Sleep: Sleep: Good   Musculoskeletal: Strength & Muscle Tone: within normal limits Gait & Station: normal Patient leans: N/A   Physical Exam: General: Sitting comfortably. NAD. HEENT: Normocephalic, atraumatic, MMM, EMOI Lungs: no increased work of breathing noted Heart: no cyanosis Abdomen: Non distended Musculoskeletal: FROM. No obvious deformities Skin: Warm, dry, intact. No rashes noted Neuro: No obvious focal deficits.  Gait and station are normal  Review of Systems  Constitutional: Negative.   HENT: Negative.    Eyes: Negative.   Respiratory: Negative.    Cardiovascular: Negative.   Gastrointestinal: Negative.   Genitourinary: Negative.   Skin: Negative.   Neurological: Negative.   Psychiatric/Behavioral:  Positive for depression, anxiety.   Blood pressure 105/75, pulse 89, temperature 98.4 F (36.9 C), temperature source Oral, resp. rate 18, height 5\' 8"  (1.727 m), weight 79.4 kg, SpO2 98%.  Body mass  index is 26.61 kg/m.  ASSESSMENT: Xavier Owens is an 53 y.o. male who  has a past medical history of Alcoholic (HCC), Hypertension, and Seizures (HCC).  He presented on 01/25/2024  2:21 PM for Major depressive disorder, recurrent severe without psychotic features (HCC).  Self-presented to the emergency room for detox. History of alcohol withdrawal seizures and was managed medically before being transferred to our unit.   Diagnoses / Active Problems: Patient Active Problem List   Diagnosis Date Noted   Major depressive disorder, recurrent severe without psychotic features (HCC) 01/25/2024   Alcohol withdrawal seizure (HCC) 01/23/2024   Alcohol use disorder, severe, dependence (HCC) 01/18/2024   Healthcare maintenance 06/10/2022   Nightmares 12/21/2021   Subclinical hypothyroidism 12/21/2021   Substance induced mood disorder (HCC) 12/14/2021   Alcohol use disorder 06/23/2021   Seizures (HCC) 06/22/2021   Elevated liver enzymes 04/19/2016   Essential hypertension 04/19/2016      PLAN: Safety and Monitoring:  -- Voluntary admission to inpatient psychiatric unit for safety, stabilization and treatment  -- Daily contact with patient to assess and evaluate symptoms and progress in treatment  -- Patient's case to be discussed in multi-disciplinary team meeting  -- Observation Level : q15 minute checks  -- Vital signs:  q12 hours  -- Precautions: suicide, elopement, and assault  2. Psychiatric Diagnoses and Treatment:  Patient Active Problem List   Diagnosis Date Noted   Major depressive disorder, recurrent severe without psychotic features (HCC) 01/25/2024   Alcohol withdrawal seizure (HCC) 01/23/2024   Alcohol use disorder, severe, dependence (HCC) 01/18/2024   Healthcare maintenance 06/10/2022   Nightmares 12/21/2021   Subclinical hypothyroidism 12/21/2021   Substance induced mood disorder (HCC) 12/14/2021   Alcohol use disorder 06/23/2021   Seizures (HCC) 06/22/2021    Elevated liver enzymes 04/19/2016   Essential hypertension 04/19/2016     Scheduled Medications:  [START ON 02/02/2024] DULoxetine  60 mg Oral Daily   folic acid  1 mg Oral Daily   gabapentin  600 mg Oral TID   multivitamin with minerals  1 tablet Oral Daily   naltrexone  25 mg Oral Daily   propranolol  10 mg Oral BID   QUEtiapine  150 mg Oral QHS   rivaroxaban  10 mg Oral Daily   thiamine  100 mg Oral Daily   cholecalciferol  2,000 Units Oral Daily    As Needed Medications: acetaminophen, alum & mag hydroxide-simeth, haloperidol **AND** diphenhydrAMINE, haloperidol lactate **AND** diphenhydrAMINE **AND** LORazepam, haloperidol lactate **AND** diphenhydrAMINE **AND** LORazepam, LORazepam, magnesium hydroxide    3. Medical Issues Being Addressed:   -- As above  Labs reviewed, unremarkable with the exception of: Anemia, UDS positive for benzodiazepines, hypertriglyceridemia (165), vitamin D insufficiency (29.42)   4. Discharge Planning:   -- Social work and case management to assist with discharge planning and identification of hospital follow-up needs prior to discharge  -- Estimated LOS: Likely discharge by the end of the week  -- Discharge Concerns: Need to establish a safety plan; Medication compliance and effectiveness  -- Discharge Goals: Return home with outpatient referrals for mental health follow-up including medication management/psychotherapy  5. Short Term Goals:  Improve ability to identify changes in lifestyle to reduce recurrence of condition, verbalize feelings, disclose and discuss suicidal ideas, demonstrate self-control, identify and develop effective coping behaviors, compliance with prescribed medications, identify triggers associated with substance abuse/mental health issues, participate in unit milieu and in scheduled group therapies   6. Long Term Goals: Improvement in symptoms so the  patient is ready for discharge   --The  risks/benefits/side-effects/alternatives to the medications above were discussed in detail with the patient and time was given for questions. The patient provided informed consent.   -- Metabolic profile and EKG monitoring obtained while on an atypical antipsychotic and listed in the EHR    Total Time Spent in Direct Patient Care:  I personally spent 35 minutes on the unit in direct patient care. The direct patient care time included face-to-face time with the patient, reviewing the patient's chart, communicating with other professionals, and coordinating care. Greater than 50% of this time was spent in counseling or coordinating care with the patient regarding goals of hospitalization, psycho-education, and discharge planning needs.      Xavier Alvine, MD Psychiatrist  02/01/2024, 11:43 AM   I certify that inpatient services furnished can reasonably be expected to improve the patient's condition.    Portions of this note were created using voice recognition software. Minor syntax errors, grammatical content, spelling, or punctuation errors may have occurred unintentionally. Please notify the Thereasa Parkin if the meaning of any statement is unclear.

## 2024-02-01 NOTE — Plan of Care (Signed)
   Problem: Education: Goal: Emotional status will improve Outcome: Progressing   Problem: Activity: Goal: Interest or engagement in activities will improve Outcome: Progressing

## 2024-02-01 NOTE — [facility] Group Notes (Signed)
 [facility] Group Notes:  (Nursing/MHT/Case Management/Adjunct)  Date:  02/01/2024  Time:  8:48 PM  Type of Therapy:   Wrap-up group  Participation Level:  Active  Participation Quality:  Appropriate  Affect:  Appropriate  Cognitive:  Appropriate  Insight:  Appropriate  Engagement in Group:  Engaged  Modes of Intervention:  Education  Summary of Progress/Problems: Pt goal to get thur the day. Rated his day 8/10.  Xavier Owens 02/01/2024, 8:48 PM

## 2024-02-01 NOTE — Progress Notes (Signed)
   01/31/24 2200  Psych Admission Type (Psych Patients Only)  Admission Status Voluntary  Psychosocial Assessment  Patient Complaints Anxiety;Depression (Anxiety 10/10, Depression 10/10)  Eye Contact Fair  Facial Expression Animated  Affect Appropriate to circumstance  Speech Logical/coherent  Interaction Assertive  Motor Activity Tremors  Appearance/Hygiene Unremarkable  Behavior Characteristics Appropriate to situation  Mood Pleasant  Thought Process  Coherency WDL  Content WDL  Delusions None reported or observed  Perception WDL  Hallucination None reported or observed  Judgment Poor  Confusion None  Danger to Self  Current suicidal ideation? Denies  Agreement Not to Harm Self Yes  Description of Agreement Verbal  Danger to Others  Danger to Others None reported or observed

## 2024-02-01 NOTE — Progress Notes (Signed)
   02/01/24 2200  Psych Admission Type (Psych Patients Only)  Admission Status Voluntary  Psychosocial Assessment  Patient Complaints Anxiety  Eye Contact Fair  Facial Expression Animated  Affect Appropriate to circumstance  Speech Logical/coherent  Interaction Assertive  Motor Activity Tremors  Appearance/Hygiene Unremarkable  Behavior Characteristics Cooperative;Appropriate to situation  Mood Pleasant  Thought Process  Coherency WDL  Content WDL  Delusions None reported or observed  Perception WDL  Hallucination None reported or observed  Judgment Poor  Confusion None  Danger to Self  Current suicidal ideation? Denies  Agreement Not to Harm Self Yes  Description of Agreement verbal  Danger to Others  Danger to Others None reported or observed

## 2024-02-02 DIAGNOSIS — F332 Major depressive disorder, recurrent severe without psychotic features: Secondary | ICD-10-CM | POA: Diagnosis not present

## 2024-02-02 MED ORDER — TRAZODONE HCL 50 MG PO TABS
50.0000 mg | ORAL_TABLET | Freq: Every evening | ORAL | Status: DC | PRN
Start: 2024-02-02 — End: 2024-02-03
  Administered 2024-02-02: 50 mg via ORAL
  Filled 2024-02-02: qty 1

## 2024-02-02 MED ORDER — QUETIAPINE FUMARATE 200 MG PO TABS
200.0000 mg | ORAL_TABLET | Freq: Every day | ORAL | Status: DC
  Administered 2024-02-02: 200 mg via ORAL
  Filled 2024-02-02 (×2): qty 1

## 2024-02-02 MED ORDER — NALTREXONE HCL 50 MG PO TABS
25.0000 mg | ORAL_TABLET | Freq: Once | ORAL | Status: AC
  Administered 2024-02-02: 25 mg via ORAL
  Filled 2024-02-02: qty 1

## 2024-02-02 MED ORDER — NALTREXONE HCL 50 MG PO TABS
50.0000 mg | ORAL_TABLET | Freq: Every day | ORAL | Status: DC
  Administered 2024-02-03: 50 mg via ORAL
  Filled 2024-02-02 (×2): qty 1

## 2024-02-02 MED ORDER — DIPHENHYDRAMINE HCL 25 MG PO CAPS: 50.0000 mg | ORAL_CAPSULE | Freq: Four times a day (QID) | ORAL | Status: DC | PRN

## 2024-02-02 MED ORDER — DIPHENHYDRAMINE HCL 50 MG/ML IJ SOLN: 50.0000 mg | Freq: Four times a day (QID) | INTRAMUSCULAR | Status: DC | PRN

## 2024-02-02 NOTE — Progress Notes (Signed)
 [facility] MD Progress Note  02/02/2024 10:38 AM Xavier Owens  MRN:  [id number] Principal Problem: Major depressive disorder, recurrent severe without psychotic features (HCC) Diagnosis: Principal Problem:   Major depressive disorder, recurrent severe without psychotic features (HCC) Active Problems:   Alcohol use disorder   ID & Admission Information: Xavier Owens is an 53 y.o. male who  has a past medical history of Alcoholic (HCC), Hypertension, and Seizures (HCC).  He presented on 01/25/2024  2:21 PM for Major depressive disorder, recurrent severe without psychotic features (HCC). Self-presented to the emergency room for detox. History of alcohol withdrawal seizures and was managed medically before being transferred to our unit.   Subjective:   Case was discussed in the multidisciplinary team. MAR was reviewed and patient was compliant with medications.  No acute events or behavioral issues were noted overnight.  The patient was seen in the interview room during rounds.  He reports that he continues to have a tremor but is improving.  He reported that he was not able to ride initially, but now states that he can write but his handwriting is "sloppy".  We had increased Seroquel yesterday with the goal of decreasing trazodone to decrease polypharmacy.  An order was obtained overnight for additional trazodone.  The patient reported some insomnia and states he only slept for 6 hours.  We discussed the rationale for trying to eliminate trazodone, and the patient agrees.  Will plan on changing trazodone to as needed if he continues to have issues with insomnia on a higher dose of Seroquel.  He continues to report feeling subjectively depressed and anxious, but denies suicidal or homicidal ideation.  He states that memory is worse since he experienced seizures and he plans to follow-up with a neurologist after discharge.  Psychoeducation was provided on the role of depression on memory.  I suspect that  memory will improve if he continues to abstain from alcohol and continues duloxetine.  Psychoeducation was provided on the timeframe for duloxetine to help with depression, anxiety, and pain.  Though the patient continues to feel subjectively depressed, he denies any suicidal ideation, fantasies, intentions, or plans.  He is appropriate for outpatient follow-up after discharge tomorrow.  Plan today will be to change trazodone to as needed, increase Seroquel to 200 mg nightly, increase naltrexone to 50 mg daily, and continue other medications as prescribed.   Past Psychiatric and Medical Medical History:  Past Medical History:  Diagnosis Date   Alcoholic (HCC)    Hypertension    Seizures (HCC)     Long history of alcohol use disorder.  No other mental health diagnosis. No past trial of any psychotropic medication. No past inpatient care. No use of any other psychoactive substances other than alcohol. Patient has been in rehab once. No past history of mania.  No past history of psychosis.  No past history of self-injurious behavior.  No past suicidal attempts.  No past history of violent behavior. No past history of neuromodulation.  Past Surgical History:  Procedure Laterality Date   dislocated hip     HAND SURGERY     KNEE SURGERY      Family History(Medical and Psychiatric):  Family History  Problem Relation Age of Onset   Cancer Mother    Cancer Father        Social History:  Social History   Substance and Sexual Activity  Alcohol Use Yes   Comment: "a fifth of vodka every day for 30 years"  Social History   Substance and Sexual Activity  Drug Use No    Social History   Socioeconomic History   Marital status: Married    Spouse name: Not on file   Number of children: Not on file   Years of education: Not on file   Highest education level: Not on file  Occupational History   Not on file  Tobacco Use   Smoking status: Former    Current packs/day: 0.10     Types: Cigarettes   Smokeless tobacco: Never  Vaping Use   Vaping status: Never Used  Substance and Sexual Activity   Alcohol use: Yes    Comment: "a fifth of vodka every day for 30 years"   Drug use: No   Sexual activity: Yes  Other Topics Concern   Not on file  Social History Narrative   Not on file   Social Drivers of Health   Financial Resource Strain: Not on file  Food Insecurity: No Food Insecurity (01/25/2024)   Hunger Vital Sign    Worried About Running Out of Food in the Last Year: Never true    Ran Out of Food in the Last Year: Never true  Transportation Needs: No Transportation Needs (01/25/2024)   PRAPARE - Administrator, Civil Service (Medical): No    Lack of Transportation (Non-Medical): No  Physical Activity: Not on file  Stress: Not on file  Social Connections: Socially Integrated (01/24/2024)   Social Connection and Isolation Panel [NHANES]    Frequency of Communication with Friends and Family: More than three times a week    Frequency of Social Gatherings with Friends and Family: More than three times a week    Attends Religious Services: More than 4 times per year    Active Member of [employer] or Organizations: Yes    Attends Engineer, structural: More than 4 times per year    Marital Status: Living with partner   Marital status: Single Are you sexually active?: No What is your sexual orientation?: "heterosexual" Has your sexual activity been affected by drugs, alcohol, medication, or emotional stress?: "no" Does patient have children?: Yes How many children?: 2 How is patient's relationship with their children?: "great" Pt reports one of his daughters he did not find out about until later     Current Medications: Current Facility-Administered Medications  Medication Dose Route Frequency Provider Last Rate Last Admin   acetaminophen (TYLENOL) tablet 650 mg  650 mg Oral Q6H PRN Maryagnes Amos, FNP   650 mg at 02/02/24 0801    alum & mag hydroxide-simeth (MAALOX/MYLANTA) 200-200-20 MG/5ML suspension 30 mL  30 mL Oral Q4H PRN Starkes-Perry, Juel Burrow, FNP       diphenhydrAMINE (BENADRYL) capsule 50 mg  50 mg Oral Q6H PRN Golda Acre, MD       Or   diphenhydrAMINE (BENADRYL) injection 50 mg  50 mg Intramuscular Q6H PRN Golda Acre, MD       DULoxetine (CYMBALTA) DR capsule 60 mg  60 mg Oral Daily Golda Acre, MD   60 mg at 02/02/24 0759   gabapentin (NEURONTIN) capsule 600 mg  600 mg Oral TID Maryagnes Amos, FNP   600 mg at 02/02/24 0759   LORazepam (ATIVAN) tablet 1 mg  1 mg Oral Q6H PRN Massengill, Harrold Donath, MD   1 mg at 01/29/24 0944   magnesium hydroxide (MILK OF MAGNESIA) suspension 30 mL  30 mL Oral Daily PRN Maryagnes Amos,  FNP       multivitamin with minerals tablet 1 tablet  1 tablet Oral Daily Maryagnes Amos, FNP   1 tablet at 02/02/24 0759   [START ON 02/03/2024] naltrexone (DEPADE) tablet 50 mg  50 mg Oral Daily Golda Acre, MD       propranolol (INDERAL) tablet 10 mg  10 mg Oral BID Maryagnes Amos, FNP   10 mg at 02/02/24 0759   QUEtiapine (SEROQUEL) tablet 200 mg  200 mg Oral QHS Golda Acre, MD       rivaroxaban Carlena Hurl) tablet 10 mg  10 mg Oral Daily Maryagnes Amos, FNP   10 mg at 02/02/24 0759   thiamine (Vitamin B-1) tablet 100 mg  100 mg Oral Daily Maryagnes Amos, FNP   100 mg at 02/02/24 0759   traZODone (DESYREL) tablet 50 mg  50 mg Oral QHS PRN Golda Acre, MD       vitamin D3 (CHOLECALCIFEROL) tablet 2,000 Units  2,000 Units Oral Daily Golda Acre, MD   2,000 Units at 02/02/24 9147    Lab Results:  Results for orders placed or performed during the hospital encounter of 01/25/24 (from the past 48 hours)  T4, free     Status: None   Collection Time: 02/01/24  6:53 PM  Result Value Ref Range   Free T4 0.74 0.61 - 1.12 ng/dL    Comment: (NOTE) Biotin ingestion may interfere with free T4 tests. If the results  are inconsistent with the TSH level, previous test results, or the clinical presentation, then consider biotin interference. If needed, order repeat testing after stopping biotin. Performed at [facility], [address] [place], [place] [address]      Blood Alcohol level:  Lab Results  Component Value Date   [facility] <10 01/23/2024   ETH <10 01/18/2024    Metabolic Disorder Labs: Lab Results  Component Value Date   HGBA1C 4.5 (L) 01/29/2024   MPG 82.45 01/29/2024   MPG 97 12/09/2021   No results found for: "PROLACTIN" Lab Results  Component Value Date   CHOL 174 01/29/2024   TRIG 165 (H) 01/29/2024   HDL 50 01/29/2024   CHOLHDL 3.5 01/29/2024   VLDL 33 01/29/2024   LDLCALC 91 01/29/2024   LDLCALC 117 (H) 12/09/2021    Physical Findings: AIMS:  , ,  ,  ,    CIWA:  CIWA-Ar Total: 3 COWS:     Psychiatric Specialty Exam:  Presentation  General Appearance: Fairly Groomed  Eye Contact: Good  Speech: Slow  Speech Volume: Decreased  Handedness: Right   Mood and Affect  Mood: Depressed; Anxious  Affect: Restricted; Congruent   Thought Process  Thought Processes: Linear  Descriptions of Associations: Intact  Orientation: Full (Time, Place and Person)  Thought Content: Logical  History of Schizophrenia/Schizoaffective disorder: No  Duration of Psychotic Symptoms: NA Hallucinations: Hallucinations: None  Ideas of Reference: None  Suicidal Thoughts: Suicidal Thoughts: No  Homicidal Thoughts: Homicidal Thoughts: No   Sensorium  Memory: Immediate Good; Recent Good  Judgment: Fair  Insight: Good   Executive Functions  Concentration: Fair  Attention Span: Good  Recall: Good  Fund of Knowledge: Good  Language: Good   Psychomotor Activity  Psychomotor Activity: Psychomotor Activity: Restlessness   Assets  Assets: Communication Skills; Desire for Improvement; Housing   Sleep  Sleep: Sleep:  Good   Musculoskeletal: Strength & Muscle Tone: within normal limits Gait & Station: normal Patient leans:  N/A   Physical Exam: General: Sitting comfortably. NAD. HEENT: Normocephalic, atraumatic, MMM, EMOI Lungs: no increased work of breathing noted Heart: no cyanosis Abdomen: Non distended Musculoskeletal: FROM. No obvious deformities Skin: Warm, dry, intact. No rashes noted Neuro: No obvious focal deficits.  Gait and station are normal  Review of Systems  Constitutional: Negative.   HENT: Negative.    Eyes: Negative.   Respiratory: Negative.    Cardiovascular: Negative.   Gastrointestinal: Negative.   Genitourinary: Negative.   Skin: Negative.   Neurological: Negative.   Psychiatric/Behavioral:  Positive for depression, anxiety.   Blood pressure 100/76, pulse 89, temperature 98.2 F (36.8 C), temperature source Oral, resp. rate 18, height 5\' 8"  (1.727 m), weight 79.4 kg, SpO2 98%. Body mass index is 26.61 kg/m.  ASSESSMENT: Xavier Owens is an 53 y.o. male who  has a past medical history of Alcoholic (HCC), Hypertension, and Seizures (HCC).  He presented on 01/25/2024  2:21 PM for Major depressive disorder, recurrent severe without psychotic features (HCC).  Self-presented to the emergency room for detox. History of alcohol withdrawal seizures and was managed medically before being transferred to our unit.   Diagnoses / Active Problems: Patient Active Problem List   Diagnosis Date Noted   Major depressive disorder, recurrent severe without psychotic features (HCC) 01/25/2024   Alcohol withdrawal seizure (HCC) 01/23/2024   Alcohol use disorder, severe, dependence (HCC) 01/18/2024   Healthcare maintenance 06/10/2022   Nightmares 12/21/2021   Subclinical hypothyroidism 12/21/2021   Substance induced mood disorder (HCC) 12/14/2021   Alcohol use disorder 06/23/2021   Seizures (HCC) 06/22/2021   Elevated liver enzymes 04/19/2016   Essential hypertension 04/19/2016       PLAN: Safety and Monitoring:  -- Voluntary admission to inpatient psychiatric unit for safety, stabilization and treatment  -- Daily contact with patient to assess and evaluate symptoms and progress in treatment  -- Patient's case to be discussed in multi-disciplinary team meeting  -- Observation Level : q15 minute checks  -- Vital signs:  q12 hours  -- Precautions: suicide, elopement, and assault  2. Psychiatric Diagnoses and Treatment:  Patient Active Problem List   Diagnosis Date Noted   Major depressive disorder, recurrent severe without psychotic features (HCC) 01/25/2024   Alcohol withdrawal seizure (HCC) 01/23/2024   Alcohol use disorder, severe, dependence (HCC) 01/18/2024   Healthcare maintenance 06/10/2022   Nightmares 12/21/2021   Subclinical hypothyroidism 12/21/2021   Substance induced mood disorder (HCC) 12/14/2021   Alcohol use disorder 06/23/2021   Seizures (HCC) 06/22/2021   Elevated liver enzymes 04/19/2016   Essential hypertension 04/19/2016     Scheduled Medications:  DULoxetine  60 mg Oral Daily   gabapentin  600 mg Oral TID   multivitamin with minerals  1 tablet Oral Daily   [START ON 02/03/2024] naltrexone  50 mg Oral Daily   propranolol  10 mg Oral BID   QUEtiapine  200 mg Oral QHS   rivaroxaban  10 mg Oral Daily   thiamine  100 mg Oral Daily   cholecalciferol  2,000 Units Oral Daily    As Needed Medications: acetaminophen, alum & mag hydroxide-simeth, diphenhydrAMINE **OR** diphenhydrAMINE, LORazepam, magnesium hydroxide, traZODone    3. Medical Issues Being Addressed:   -- As above  Labs reviewed, unremarkable with the exception of: Anemia, UDS positive for benzodiazepines, hypertriglyceridemia (165), vitamin D insufficiency (29.42)   4. Discharge Planning:   -- Social work and case management to assist with discharge planning and identification of hospital follow-up needs prior  to discharge  -- Estimated LOS: Likely discharge by the end  of the week  -- Discharge Concerns: Need to establish a safety plan; Medication compliance and effectiveness  -- Discharge Goals: Return home with outpatient referrals for mental health follow-up including medication management/psychotherapy  5. Short Term Goals:  Improve ability to identify changes in lifestyle to reduce recurrence of condition, verbalize feelings, disclose and discuss suicidal ideas, demonstrate self-control, identify and develop effective coping behaviors, compliance with prescribed medications, identify triggers associated with substance abuse/mental health issues, participate in unit milieu and in scheduled group therapies   6. Long Term Goals: Improvement in symptoms so the patient is ready for discharge   --The risks/benefits/side-effects/alternatives to the medications above were discussed in detail with the patient and time was given for questions. The patient provided informed consent.   -- Metabolic profile and EKG monitoring obtained while on an atypical antipsychotic and listed in the EHR    Total Time Spent in Direct Patient Care:  I personally spent 35 minutes on the unit in direct patient care. The direct patient care time included face-to-face time with the patient, reviewing the patient's chart, communicating with other professionals, and coordinating care. Greater than 50% of this time was spent in counseling or coordinating care with the patient regarding goals of hospitalization, psycho-education, and discharge planning needs.      Criss Alvine, MD Psychiatrist  02/02/2024, 10:38 AM   I certify that inpatient services furnished can reasonably be expected to improve the patient's condition.    Portions of this note were created using voice recognition software. Minor syntax errors, grammatical content, spelling, or punctuation errors may have occurred unintentionally. Please notify the Thereasa Parkin if the meaning of any statement is unclear.

## 2024-02-02 NOTE — Plan of Care (Signed)
  Problem: Education: Goal: Emotional status will improve Outcome: Progressing   Problem: Activity: Goal: Interest or engagement in activities will improve Outcome: Progressing Goal: Sleeping patterns will improve Outcome: Progressing   Problem: Safety: Goal: Periods of time without injury will increase Outcome: Progressing

## 2024-02-02 NOTE — Group Note (Signed)
 Recreation Therapy Group Note   Group Topic:Problem Solving  Group Date: 02/02/2024 Start Time: 0930 End Time: 1000 Facilitators: Ezrah Dembeck-McCall, LRT,CTRS Location: [address] Dayroom   Group Topic: Communication, Team Building, Problem Solving  Goal Area(s) Addresses:  Patient will effectively work with peer towards shared goal.  Patient will identify skills used to make activity successful.  Patient will share challenges and verbalize solution-driven approaches used. Patient will identify how skills used during activity can be used to reach post d/c goals.   Intervention: STEM Activity   Activity: [employer]. Patients were provided the following materials: 5 drinking straws, 5 rubber bands, 5 paper clips, 2 index cards and 2 drinking cups. Using the provided materials patients were asked to build a launching mechanism to launch a ping pong ball across the room, approximately 10 feet. Patients were divided into teams of 3-5. Instructions required all materials be incorporated into the device, functionality of items left to the peer group's discretion.  Education: Pharmacist, community, Scientist, physiological, Air cabin crew, Building control surveyor.   Education Outcome: Acknowledges education/In group clarification offered/Needs additional education.    Affect/Mood: Appropriate   Participation Level: Engaged   Participation Quality: Independent   Behavior: Appropriate   Speech/Thought Process: Focused   Insight: Good   Judgement: Good   Modes of Intervention: STEM Activity   Patient Response to Interventions:  Engaged   Education Outcome:  In group clarification offered    Clinical Observations/Individualized Feedback: Pt was focused and determined to create a launcher with peers. Pt appeared to be the lead within his group on how to construct the launcher. Pt engaged peers with certain parts when needed. Pt eventually gave up when one of the key pieces of their launcher  broke.     Plan: Continue to engage patient in RT group sessions 2-3x/week.   Mouna Yager-McCall, LRT,CTRS 02/02/2024 11:26 AM

## 2024-02-02 NOTE — Group Note (Signed)
 Date:  02/02/2024 Time:  5:22 PM  Group Topic/Focus:  Goals Group:   The focus of this group is to help patients establish daily goals to achieve during treatment and discuss how the patient can incorporate goal setting into their daily lives to aide in recovery. Orientation:   The focus of this group is to educate the patient on the purpose and policies of crisis stabilization and provide a format to answer questions about their admission.  The group details unit policies and expectations of patients while admitted.    Participation Level:  Active  Participation Quality:  Appropriate  Affect:  Appropriate  Cognitive:  Appropriate  Insight: Appropriate  Engagement in Group:  Engaged  Modes of Intervention:  Discussion and Orientation  Additional Comments:   Pt attended and actively participated in the Orientation and Goals group.  Edmund Hilda Jabaree Mercado 02/02/2024, 5:22 PM

## 2024-02-02 NOTE — Progress Notes (Signed)
   02/02/24 1139  Psych Admission Type (Psych Patients Only)  Admission Status Voluntary  Psychosocial Assessment  Patient Complaints Anxiety;Depression  Eye Contact Fair  Facial Expression Flat  Affect Appropriate to circumstance  Speech Logical/coherent  Interaction Assertive  Motor Activity Other (Comment) (WDL)  Appearance/Hygiene Unremarkable  Behavior Characteristics Cooperative  Mood Pleasant  Thought Process  Coherency WDL  Content WDL  Delusions None reported or observed  Perception WDL  Hallucination None reported or observed  Judgment Poor  Confusion None  Danger to Self  Current suicidal ideation? Denies  Agreement Not to Harm Self Yes  Description of Agreement verbal  Danger to Others  Danger to Others None reported or observed

## 2024-02-03 DIAGNOSIS — F332 Major depressive disorder, recurrent severe without psychotic features: Secondary | ICD-10-CM | POA: Diagnosis not present

## 2024-02-03 MED ORDER — CHOLECALCIFEROL 125 MCG (5000 UT) PO TABS: 1.0000 | ORAL_TABLET | Freq: Every day | ORAL | 0 refills | Status: DC

## 2024-02-03 MED ORDER — PROPRANOLOL HCL 10 MG PO TABS: 10.0000 mg | ORAL_TABLET | Freq: Two times a day (BID) | ORAL | 0 refills | Status: DC

## 2024-02-03 MED ORDER — NALTREXONE HCL 50 MG PO TABS: 50.0000 mg | ORAL_TABLET | Freq: Every day | ORAL | 0 refills | Status: DC

## 2024-02-03 MED ORDER — RIVAROXABAN 10 MG PO TABS: 10.0000 mg | ORAL_TABLET | Freq: Every day | ORAL | 0 refills | Status: DC

## 2024-02-03 MED ORDER — QUETIAPINE FUMARATE 100 MG PO TABS
100.0000 mg | ORAL_TABLET | Freq: Every day | ORAL | Status: DC
  Filled 2024-02-03: qty 1

## 2024-02-03 MED ORDER — QUETIAPINE FUMARATE 100 MG PO TABS: 100.0000 mg | ORAL_TABLET | Freq: Every day | ORAL | 0 refills | Status: DC

## 2024-02-03 MED ORDER — DULOXETINE HCL 60 MG PO CPEP: 60.0000 mg | ORAL_CAPSULE | Freq: Every day | ORAL | 0 refills | Status: DC

## 2024-02-03 MED ORDER — TRAZODONE HCL 100 MG PO TABS: 100.0000 mg | ORAL_TABLET | Freq: Every evening | ORAL | Status: DC | PRN

## 2024-02-03 MED ORDER — TRAZODONE HCL 100 MG PO TABS: 100.0000 mg | ORAL_TABLET | Freq: Every evening | ORAL | 0 refills | Status: DC | PRN

## 2024-02-03 NOTE — Discharge Summary (Signed)
 Physician Discharge Summary Note  Patient:  Xavier Owens is a 53 y.o. male  MRN:  [id number]  DOB:  21-Dec-1970  Patient phone: [phone] (home)  Patient address:   [address] [place] [address]   Total Time spent with patient: 32 Minutes  Date of Admission:  01/25/2024  Date of Discharge: 02/03/24   Reason for Admission: Per Dr. Jackquline Owens, 53 year old African-American male, veteran, runs his own business, his fiance lives with him.  Background history of alcohol use disorder.  Self-presented to the emergency room for detox.  History of alcohol withdrawal seizures and was managed medically before being transferred to our unit.   Chart reviewed today.  Patient discussed at multidisciplinary team meeting.   Nursing staff reports that patient has been adherent with treatment.  No challenging behavior on the unit.   At interview with patient, he reports a long history of alcohol use.  States that he was drinking on a daily basis.  He never stopped drinking because he will go into withdrawals.  States that he has had multiple DUIs.  He has had seizures twice from alcohol withdrawals.  States that he came to the hospital on own accord so as to come off alcohol and stay sober.   Patient reports very good relationship with his fiance.  They have been together for 15 years.  Patient's daughter is training to become a gynecologist.  They have a good good relationship.  States that his daughter influenced his decision to become sober.  Patient has not made attempts to sober up in the past.   He tells me that he feels decent.  He does not feel down in the dumps.  He still has some anxiety related to alcohol withdrawal.  He is still experiencing tremors.  He is not endorsing any tactile hallucination.  No visual hallucination.  No auditory hallucinations.  Patient is not endorsing any other form of abnormal perception.  He is not expressing any delusion.   Patient is not endorsing  any psychosocial stressors.  No legal issues.  No major financial issues.  No relational issues.   There are no manic symptoms.  No psychotic symptoms.  No evidence of PTSD.  No evidence of OCD.  No use of any other psychoactive substance. Review of symptoms is essentially as above  Principal Problem: Major depressive disorder, recurrent severe without psychotic features [facility])  Discharge Diagnoses: Principal Problem:   Major depressive disorder, recurrent severe without psychotic features (HCC) Active Problems:   Alcohol use disorder     Past Psychiatric (and medical) History: Xavier Owens  has a past medical history of Alcoholic (HCC), Hypertension, and Seizures (HCC).  Long history of alcohol use disorder.  No other mental health diagnosis. No past trial of any psychotropic medication. No past inpatient care. No use of any other psychoactive substances other than alcohol. Patient has been in rehab once. No past history of mania.  No past history of psychosis.  No past history of self-injurious behavior.  No past suicidal attempts.  No past history of violent behavior. No past history of neuromodulation.  Past Medical History:  Past Medical History:  Diagnosis Date   Alcoholic (HCC)    Hypertension    Seizures (HCC)      Past Surgical History:  Procedure Laterality Date   dislocated hip     HAND SURGERY     KNEE SURGERY       Family History:  Family History  Problem Relation Age  of Onset   Cancer Mother    Cancer Father      Family Psychiatric  History: Denies  Social History:  Social History   Substance and Sexual Activity  Alcohol Use Yes   Comment: "a fifth of vodka every day for 30 years"     Social History   Substance and Sexual Activity  Drug Use No     Social History   Socioeconomic History   Marital status: Married    Spouse name: Not on file   Number of children: Not on file   Years of education: Not on file   Highest education level:  Not on file  Occupational History   Not on file  Tobacco Use   Smoking status: Former    Current packs/day: 0.10    Types: Cigarettes   Smokeless tobacco: Never  Vaping Use   Vaping status: Never Used  Substance and Sexual Activity   Alcohol use: Yes    Comment: "a fifth of vodka every day for 30 years"   Drug use: No   Sexual activity: Yes  Other Topics Concern   Not on file  Social History Narrative   Not on file   Social Drivers of Health   Financial Resource Strain: Not on file  Food Insecurity: No Food Insecurity (01/25/2024)   Hunger Vital Sign    Worried About Running Out of Food in the Last Year: Never true    Ran Out of Food in the Last Year: Never true  Transportation Needs: No Transportation Needs (01/25/2024)   PRAPARE - Administrator, Civil Service (Medical): No    Lack of Transportation (Non-Medical): No  Physical Activity: Not on file  Stress: Not on file  Social Connections: Socially Integrated (01/24/2024)   Social Connection and Isolation Panel [NHANES]    Frequency of Communication with Friends and Family: More than three times a week    Frequency of Social Gatherings with Friends and Family: More than three times a week    Attends Religious Services: More than 4 times per year    Active Member of [employer] or Organizations: Yes    Attends Engineer, structural: More than 4 times per year    Marital Status: Living with partner     Hospital Course:  During the patient's hospitalization, patient had extensive initial psychiatric evaluation, and follow-up psychiatric evaluations every day.  Psychiatric diagnoses provided upon initial assessment: MDD (major depressive disorder), recurrent episode, severe (HCC) [F33.2]   Patient's home medication of duloxetine 20 mg was continued and titrated to 60 mg daily.  Home gabapentin 600 mg 3 times daily was continued for peripheral neuropathy.  Multivitamin, folate, and thiamine were prescribed due  to history of alcohol use disorder.  Folate was tested and determined to be normal so folate was discontinued.  He was started on naltrexone 50 mg daily titrated from 25 mg for alcohol use disorder.  Home propranolol 10 mg twice daily was continued.  Home quetiapine was titrated from 100 to 200 mg; however, the patient experienced 2 episodes of urinary incontinence on the higher dose, so it was decreased back to 100 mg.  He was continued on rivaroxaban.  Colecalciferol was started for vitamin D insufficiency.  Trazodone was decreased from 100 mg to 50 mg as Seroquel was increased; however, as the patient experienced urinary incontinence with the higher dose of Seroquel, trazodone was increased back to 100 mg.  Patient's care was discussed during the interdisciplinary team  meeting every day during the hospitalization.  The patient endorsed having side effects to prescribed psychiatric medication, as above and with Seroquel only.  Gradually, patient started adjusting to milieu. The patient was evaluated each day by a clinical provider to ascertain response to treatment. Improvement was noted by the patient's report of decreasing symptoms, improved sleep and appetite, affect, medication tolerance, behavior, and participation in unit programming.  Patient was asked each day to complete a self inventory noting mood, mental status, pain, new symptoms, anxiety and concerns.    Symptoms were reported as significantly decreased or resolved completely by discharge.   On day of discharge, the patient reports that their mood is stable. The patient denied having suicidal thoughts for more than 48 hours prior to discharge.  Patient denies having homicidal thoughts.  Patient denies having auditory hallucinations.  Patient denies any visual hallucinations or other symptoms of psychosis. The patient was motivated to continue taking medication with a goal of continued improvement in mental health.   The patient reports  their target psychiatric symptoms of alcohol withdrawal responded well to the psychiatric medications, and the patient reports overall benefit other psychiatric hospitalization. Supportive psychotherapy was provided to the patient. The patient also participated in regular group therapy while hospitalized. Coping skills, problem solving as well as relaxation therapies were also part of the unit programming.  Labs were reviewed with the patient, and abnormal results were discussed with the patient.  The patient is able to verbalize their individual safety plan to this provider.    Physical Findings:  AIMS:  Facial and Oral Movements: None Muscles of Facial Expression: None Lips and Perioral Area: None Jaw: None Tongue: None,Extremity Movements Upper (arms, wrists, hands, fingers): None Lower (legs, knees, ankles, toes): None, Trunk Movements Neck, shoulders, hips: None, Global Judgements Severity of abnormal movements overall: None Incapacitation due to abnormal movements: None Patient's awareness of abnormal movements: No Awareness, Dental Status Current problems with teeth and/or dentures: No Does patient usually wear dentures: No Ed[place]ia: No   CIWA:   NA  COWS:  NA  Musculoskeletal: Strength & Muscle Tone: within normal limits Gait & Station: normal Patient leans: N/A    Psychiatric Specialty Exam:  Presentation  General Appearance: Appropriate for Environment  Eye Contact: Good  Speech: Clear and Coherent; Normal Rate  Speech Volume: Normal  Handedness: Right   Mood and Affect  Mood: Euthymic  Affect: Congruent   Thought Process  Thought Processes: Linear  Descriptions of Associations: Intact  Orientation: Full (Time, Place and Person)  Thought Content: Logical  History of Schizophrenia/Schizoaffective disorder: No  Duration of Psychotic Symptoms: NA Hallucinations: Hallucinations: None  Ideas of Reference: None  Suicidal Thoughts: Suicidal  Thoughts: No  Homicidal Thoughts: Homicidal Thoughts: No   Sensorium  Memory: Immediate Good  Judgment: Good  Insight: Good   Executive Functions  Concentration: Good  Attention Span: Good  Recall: Good  Fund of Knowledge: Good  Language: Good   Psychomotor Activity  Psychomotor Activity: Psychomotor Activity: Normal   Assets  Assets: Communication Skills; Social Support; Housing   Sleep  Sleep: Sleep: Good      Physical Exam: General: Sitting comfortably. NAD. HEENT: Normocephalic, atraumatic, MMM, EMOI Lungs: no increased work of breathing noted Heart: no cyanosis Abdomen: Non distended Musculoskeletal: FROM. No obvious deformities Skin: Warm, dry, intact. No rashes noted Neuro: No obvious focal deficits.  Gait and station are normal  Review of Systems:  Constitutional: Negative.   HENT: Negative.    Eyes:  Negative.   Respiratory: Negative.    Cardiovascular: Negative.   Gastrointestinal: Negative.   Genitourinary: Negative.   Skin: Negative.   Neurological: Negative.   Psychiatric/Behavioral:  Negative  Blood pressure 111/72, pulse 76, temperature 98 F (36.7 C), temperature source Oral, resp. rate 18, height 5\' 8"  (1.727 m), weight 79.4 kg, SpO2 98%. Body mass index is 26.61 kg/m.    Social History   Tobacco Use  Smoking Status Former   Current packs/day: 0.10   Types: Cigarettes  Smokeless Tobacco Never     Tobacco Cessation:  A prescription for an FDA approved medication for tobacco cessation was not prescribed because: Patient Refused   Blood Alcohol level:  Lab Results  Component Value Date   ETH <10 01/23/2024   ETH <10 01/18/2024    Metabolic Disorder Labs:  Lab Results  Component Value Date   HGBA1C 4.5 (L) 01/29/2024   MPG 82.45 01/29/2024   MPG 97 12/09/2021   No results found for: "PROLACTIN"  Lab Results  Component Value Date   CHOL 174 01/29/2024   TRIG 165 (H) 01/29/2024   HDL 50 01/29/2024   VLDL  33 01/29/2024   LDLCALC 91 01/29/2024   LDLCALC 117 (H) 12/09/2021      See Psychiatric Specialty Exam and Suicide Risk Assessment completed by Attending Physician prior to discharge.  Discharge destination: Home  Is patient on multiple antipsychotic therapies at discharge:  No  Has Patient had three or more failed trials of antipsychotic monotherapy by history: NA Recommended Plan for Multiple Antipsychotic Therapies: NA   Discharge Instructions     Diet - low sodium heart healthy   Complete by: As directed    Increase activity slowly   Complete by: As directed         Allergies as of 02/03/2024   No Known Allergies      Medication List     STOP taking these medications    chlordiazePOXIDE 25 MG capsule Commonly known as: LIBRIUM   folic acid 1 MG tablet Commonly known as: FOLVITE   thiamine 100 MG tablet Commonly known as: VITAMIN B1       TAKE these medications      Indication  Cholecalciferol 125 MCG (5000 UT) Tabs Take 1 tablet (5,000 Units total) by mouth daily.  Indication: Vitamin D Deficiency   DULoxetine 60 MG capsule Commonly known as: CYMBALTA Take 1 capsule (60 mg total) by mouth daily. Start taking on: February 04, 2024 What changed:  medication strength how much to take  Indication: Major Depressive Disorder   gabapentin 300 MG capsule Commonly known as: NEURONTIN Take 2 capsules (600 mg total) by mouth 3 (three) times daily.  Indication: Neuropathic Pain   multivitamin with minerals Tabs tablet Take 1 tablet by mouth daily.  Indication: Nutritional support   naltrexone 50 MG tablet Commonly known as: DEPADE Take 1 tablet (50 mg total) by mouth daily. Start taking on: February 04, 2024  Indication: Abuse or Misuse of Alcohol   propranolol 10 MG tablet Commonly known as: INDERAL Take 1 tablet (10 mg total) by mouth 2 (two) times daily.  Indication: Fine to Coarse Slow Tremor Affecting Head, Hands & Voice   QUEtiapine 100 MG  tablet Commonly known as: SEROquel Take 1 tablet (100 mg total) by mouth at bedtime.  Indication: Major Depressive Disorder   rivaroxaban 10 MG Tabs tablet Commonly known as: XARELTO Take 1 tablet (10 mg total) by mouth daily. Start taking on: February 04, 2024  Indication: Treatment to Prevent Deep Vein Thrombosis   traZODone 100 MG tablet Commonly known as: DESYREL Take 1 tablet (100 mg total) by mouth at bedtime as needed for sleep. What changed:  when to take this reasons to take this  Indication: Trouble Sleeping          Follow-up Information     [facility] Follow up on 02/27/2024.   Specialty: Behavioral Health Why: You have an appointment for medication management services on 02/27/24 at 8:00 am.  You also have an appointment for therapy services on 03/06/24 at 1:00 pm. Contact information: [address] [place] [address] [phone]                   Follow-up recommendations:  - It is recommended to the patient to continue psychiatric medications as prescribed, after discharge from the hospital.   - It is recommended to the patient to follow up with your outpatient psychiatric provider, neurologist, and PCP. - It was discussed with the patient, the impact of alcohol, drugs, tobacco have been there overall psychiatric and medical wellbeing, and total abstinence from substance use was recommended the patient. - Prescriptions provided or sent directly to preferred pharmacy at discharge. Patient agreeable to plan. Given opportunity to ask questions. Appears to feel comfortable with discharge.   - In the event of worsening symptoms, the patient is instructed to call the crisis hotline, 911 and or go to the nearest ED for appropriate evaluation and treatment of symptoms. To follow-up with primary care provider for other medical issues, concerns and or health care needs - Patient was discharged home with a plan to follow up as  noted above.   Comments:  NA  Signed: Criss Alvine, MD 02/03/24 10:58 AM

## 2024-02-03 NOTE — Transportation (Signed)
 02/03/2024  Xavier Owens DOB: 03/16/1971 MRN: [id number]   RIDER WAIVER AND RELEASE OF LIABILITY  For the purposes of helping with transportation needs, [place] partners with outside transportation providers (taxi companies, [place], Catering manager.) to give [employer] patients or other approved people the choice of on-demand rides [employer]") to our buildings for non-emergency visits.  By using [employer], I, the person signing this document, on behalf of myself and/or any legal minors (in my care using the [employer]), agree:  Science writer given to me are supplied by independent, outside transportation providers who do not work for, or have any affiliation with, [employer]. [place] is not a transportation company. [facility] [facility] has no control over the quality or safety of the rides I get using [employer]. [place] has no control over whether any outside ride will happen on time or not. [place] gives no guarantee on the reliability, quality, safety, or availability on any rides, or that no mistakes will happen. I know and accept that traveling by vehicle (car, truck, [employer], Zenaida Niece, bus, taxi, etc.) has risks of serious injuries such as disability, being paralyzed, and death. I know and agree the risk of using [employer] is mine alone, and not [employer]. Transport Services are provided "as is" and as are available. The transportation providers are in charge for all inspections and care of the vehicles used to provide these rides. I agree not to take legal action against [facility], its agents, employees, officers, directors, representatives, insurers, attorneys, assigns, successors, subsidiaries, and affiliates at any time for any reasons related directly or indirectly to using [employer]. I also agree not to take legal action against [place] or its affiliates for any injury, death, or damage to property caused by or related to using  [employer]. I have read this Waiver and Release of Liability, and I understand the terms used in it and their legal meaning. This Waiver is freely and voluntarily given with the understanding that my right (or any legal minors) to legal action against [place] relating to [employer] is knowingly given up to use these services.   I attest that I read the Ride Waiver and Release of Liability to Xavier Owens, gave Mr. Meadowcroft the opportunity to ask questions and answered the questions asked (if any). I affirm that Xavier Owens then provided consent for assistance with transportation.

## 2024-02-03 NOTE — Group Note (Signed)
 Date:  02/03/2024 Time:  1:04 PM  Group Topic/Focus:  Goals Group:   The focus of this group is to help patients establish daily goals to achieve during treatment and discuss how the patient can incorporate goal setting into their daily lives to aide in recovery. Orientation:   The focus of this group is to educate the patient on the purpose and policies of crisis stabilization and provide a format to answer questions about their admission.  The group details unit policies and expectations of patients while admitted.    Participation Level:  Did Not Attend  Participation Quality:   n/a  Affect:   n/a  Cognitive:   n/a  Insight: None  Engagement in Group:   n/a  Modes of Intervention:   n/a  Additional Comments:   Did not attend  Dhruti Ghuman M Tiara Bartoli 02/03/2024, 1:04 PM

## 2024-02-03 NOTE — Progress Notes (Signed)
 Patient verbalizes readiness for discharge. All patient belongings returned to patient. Discharge instructions read and discussed with patient (appointments, medications, resources). Patient expressed gratitude for care provided. Patient discharged to lobby at 1315 where taxi driver was waiting.

## 2024-02-03 NOTE — Progress Notes (Signed)
  [facility] Adult Case Management Discharge Plan :  Will you be returning to the same living situation after discharge:  No. At discharge, do you have transportation home?: Yes,  CSW will assist with transportation at discharge. Do you have the ability to pay for your medications: Yes,  Patient reports ability to obtain medications at discharge.  Release of information consent forms completed and in the chart;  Patient's signature needed at discharge.  Patient to Follow up at:  Follow-up Information     [facility] Follow up on 02/27/2024.   Specialty: Behavioral Health Why: You have an appointment for medication management services on 02/27/24 at 8:00 am.  You also have an appointment for therapy services on 03/06/24 at 1:00 pm. Contact information: [address] [place] [address] [phone]                Next level of care provider has access to [facility] Link:yes  Safety Planning and Suicide Prevention discussed: Yes,  Patient declined consent   Has patient been referred to the Quitline?: Patient does not use tobacco/nicotine products  Patient has been referred for addiction treatment: No known substance use disorder.  Felecia Shelling Shontez Sermon, LCSWA 02/03/2024, 9:45 AM

## 2024-02-03 NOTE — Progress Notes (Signed)
   02/02/24 2100  Psych Admission Type (Psych Patients Only)  Admission Status Voluntary  Psychosocial Assessment  Patient Complaints Anxiety;Depression (anxiety 10/10, depression 10/10)  Eye Contact Fair  Facial Expression Anxious;Other (Comment) (smiling)  Affect Appropriate to circumstance  Speech Logical/coherent  Interaction Assertive  Motor Activity Other (Comment) (WDL)  Appearance/Hygiene Unremarkable  Behavior Characteristics Cooperative  Mood Pleasant  Thought Process  Coherency WDL  Content WDL  Delusions None reported or observed  Perception WDL  Hallucination None reported or observed  Judgment Poor  Confusion None  Danger to Self  Current suicidal ideation? Denies  Agreement Not to Harm Self Yes  Description of Agreement Verbal  Danger to Others  Danger to Others None reported or observed

## 2024-02-03 NOTE — [facility] Suicide Risk Assessment (Signed)
 [facility] Discharge Suicide Risk Assessment   Principal Problem: Major depressive disorder, recurrent severe without psychotic features [facility])  Discharge Diagnoses: Principal Problem:   Major depressive disorder, recurrent severe without psychotic features (HCC) Active Problems:   Alcohol use disorder      Total Time spent with patient: 40  Musculoskeletal: Strength & Muscle Tone: within normal limits Gait & Station: normal Patient leans: N/A   Psychiatric Specialty Exam:  Presentation  General Appearance: Appropriate for Environment  Eye Contact: Good  Speech: Clear and Coherent; Normal Rate  Speech Volume: Normal  Handedness: Right   Mood and Affect  Mood: Euthymic  Affect: Congruent   Thought Process  Thought Processes: Linear  Descriptions of Associations: Intact  Orientation: Full (Time, Place and Person)  Thought Content: Logical  History of Schizophrenia/Schizoaffective disorder: No  Duration of Psychotic Symptoms: NA Hallucinations: Hallucinations: None  Ideas of Reference: None  Suicidal Thoughts: Suicidal Thoughts: No  Homicidal Thoughts: Homicidal Thoughts: No   Sensorium  Memory: Immediate Good  Judgment: Good  Insight: Good   Executive Functions  Concentration: Good  Attention Span: Good  Recall: Good  Fund of Knowledge: Good  Language: Good   Psychomotor Activity  Psychomotor Activity: Psychomotor Activity: Normal   Assets  Assets: Communication Skills; Social Support; Housing   Sleep  Sleep: Sleep: Good   Physical Exam: General: Sitting comfortably. NAD. HEENT: Normocephalic, atraumatic, MMM, EMOI Lungs: no increased work of breathing noted Heart: no cyanosis Abdomen: Non distended Musculoskeletal: FROM. No obvious deformities Skin: Warm, dry, intact. No rashes noted Neuro: No obvious focal deficits.  Gait and station are normal  Review of Systems  Constitutional: Negative.   HENT: Negative.    Eyes:  Negative.   Respiratory: Negative.    Cardiovascular: Negative.   Gastrointestinal: Negative.   Genitourinary: Negative.   Skin: Negative.   Neurological: Negative.   Psychiatric/Behavioral:  Negative   Mental Status Per Nursing Assessment: Thoughts of violence towards others  Demographic Factors:  Male, Low socioeconomic status, and Unemployed  Loss Factors: Decrease in vocational status and Decline in physical health  Historical Factors: Family history of mental illness or substance abuse  Risk Reduction Factors:   Sense of responsibility to family, Living with another person, especially a relative, Positive social support, and Positive therapeutic relationship  Continued Clinical Symptoms:  Previous psychiatric diagnoses and treatments  Cognitive Features That Contribute To Risk:  None  Suicide Risk:  Minimal: No identifiable suicidal ideation.  Patients presenting with no risk factors but with morbid ruminations; may be classified as minimal risk based on the severity of the depressive symptoms.    Follow-up Information     [facility] Follow up on 02/27/2024.   Specialty: Behavioral Health Why: You have an appointment for medication management services on 02/27/24 at 8:00 am.  You also have an appointment for therapy services on 03/06/24 at 1:00 pm. Contact information: [address] [place] [address] [phone]                 Plan Of Care/Follow-up recommendations:  Activity: as tolerated  Diet: heart healthy  Other: -Follow-up with your outpatient psychiatric provider -instructions on appointment date, time, and address (location) are provided to you in discharge paperwork.  -Take your psychiatric medications as prescribed at discharge - instructions are provided to you in the discharge paperwork  -Follow-up with outpatient primary care doctor and other specialists -for management of preventative medicine  and chronic medical issues  -Testing:  Follow-up with outpatient provider for abnormal lab results: Hypovitaminosis D, hypertriglyceridemia, anemia  -If you are prescribed an atypical antipsychotic medication, we recommend that your outpatient psychiatrist follow routine screening for side effects within 3 months of discharge, including monitoring: AIMS scale, height, weight, blood pressure, fasting lipid panel, HbA1c, and fasting blood sugar.   -Recommend total abstinence from alcohol, tobacco, and other illicit drug use at discharge.   -If your psychiatric symptoms recur, worsen, or if you have side effects to your psychiatric medications, call your outpatient psychiatric provider, 911, [phone] or go to the nearest emergency department.  -If suicidal thoughts occur, immediately call your outpatient psychiatric provider, 911, [phone] or go to the nearest emergency department.   Criss Alvine, MD 02/03/24 10:50 AM

## 2024-02-05 ENCOUNTER — Telehealth: Payer: Self-pay | Admitting: *Deleted

## 2024-02-05 NOTE — Telephone Encounter (Signed)
 Copied from CRM [id number]. Topic: Referral - Request for Referral >> Feb 05, 2024  1:31 PM Maree Krabbe H wrote: Did the patient discuss referral with their provider in the last year? No (If No - schedule appointment) (If Yes - send message)  Appointment offered? No  Type of order/referral and detailed reason for visit: Neurologist  Preference of office, provider, location: None  If referral order, have you been seen by this specialty before? No (If Yes, this issue or another issue? When? Where?  Can we respond through MyChart? Yes  Patient called today and wanted to see a neurologist and patient callback [phone]

## 2024-02-05 NOTE — Telephone Encounter (Signed)
 Will refer message to PCP.

## 2024-02-07 ENCOUNTER — Ambulatory Visit: Payer: MEDICAID | Admitting: Student

## 2024-02-07 DIAGNOSIS — F1090 Alcohol use, unspecified, uncomplicated: Secondary | ICD-10-CM | POA: Diagnosis not present

## 2024-02-07 DIAGNOSIS — M25561 Pain in right knee: Secondary | ICD-10-CM

## 2024-02-07 DIAGNOSIS — F1994 Other psychoactive substance use, unspecified with psychoactive substance-induced mood disorder: Secondary | ICD-10-CM

## 2024-02-07 DIAGNOSIS — F1020 Alcohol dependence, uncomplicated: Secondary | ICD-10-CM

## 2024-02-07 DIAGNOSIS — F515 Nightmare disorder: Secondary | ICD-10-CM

## 2024-02-07 DIAGNOSIS — R413 Other amnesia: Secondary | ICD-10-CM

## 2024-02-07 DIAGNOSIS — M792 Neuralgia and neuritis, unspecified: Secondary | ICD-10-CM

## 2024-02-07 MED ORDER — GABAPENTIN 300 MG PO CAPS: 600.0000 mg | ORAL_CAPSULE | Freq: Three times a day (TID) | ORAL | 3 refills | Status: DC

## 2024-02-07 MED ORDER — ADULT MULTIVITAMIN W/MINERALS CH: 1.0000 | ORAL_TABLET | Freq: Every day | ORAL | 0 refills | Status: DC

## 2024-02-07 MED ORDER — TRAZODONE HCL 100 MG PO TABS
100.0000 mg | ORAL_TABLET | Freq: Every evening | ORAL | 3 refills | Status: DC | PRN
Start: 2024-02-07 — End: 2024-04-03

## 2024-02-07 MED ORDER — QUETIAPINE FUMARATE 100 MG PO TABS
100.0000 mg | ORAL_TABLET | Freq: Every day | ORAL | 3 refills | Status: DC
Start: 2024-02-07 — End: 2024-04-03

## 2024-02-07 MED ORDER — NALTREXONE HCL 50 MG PO TABS: 50.0000 mg | ORAL_TABLET | Freq: Every day | ORAL | 3 refills | Status: DC

## 2024-02-07 MED ORDER — DICLOFENAC SODIUM 1 % EX GEL: 4.0000 g | Freq: Four times a day (QID) | CUTANEOUS | 3 refills | Status: DC

## 2024-02-07 MED ORDER — DULOXETINE HCL 60 MG PO CPEP
60.0000 mg | ORAL_CAPSULE | Freq: Every day | ORAL | 3 refills | Status: DC
Start: 2024-02-07 — End: 2024-04-03

## 2024-02-07 MED ORDER — PROPRANOLOL HCL 10 MG PO TABS: 10.0000 mg | ORAL_TABLET | Freq: Two times a day (BID) | ORAL | 3 refills | Status: DC

## 2024-02-07 NOTE — Assessment & Plan Note (Signed)
 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  He had originally presented to the behavioral health hospital for alcohol detoxification, his stay was complicated by withdrawal seizure at which time he was transferred to [facility] and admitted to the internal medicine teaching service.  Librium taper was initiated at that time, the patient was seizure-free and was transferred back to behavioral health.  Patient states he has been abstinent from alcohol following his discharge.  He states that he his last drink was about a month ago.  Denies any seizures in the interim.  I have refilled his medications as outlined by his behavioral health discharge summary.

## 2024-02-07 NOTE — Patient Instructions (Addendum)
 Thank you, [name] for allowing [place] to provide your care today.   Referrals ordered today:   Referral Orders         Ambulatory referral to Physical Therapy         Ambulatory referral to Sports Medicine       I have ordered the following medication/changed the following medications:   Stop the following medications: Medications Discontinued During This Encounter  Medication Reason   rivaroxaban (XARELTO) 10 MG TABS tablet    gabapentin (NEURONTIN) 300 MG capsule Reorder   Multiple Vitamin (MULTIVITAMIN WITH MINERALS) TABS tablet Reorder   propranolol (INDERAL) 10 MG tablet Reorder   DULoxetine (CYMBALTA) 60 MG capsule Reorder   QUEtiapine (SEROQUEL) 100 MG tablet    traZODone (DESYREL) 100 MG tablet Reorder   naltrexone (DEPADE) 50 MG tablet Reorder     Start the following medications: Meds ordered this encounter  Medications   DULoxetine (CYMBALTA) 60 MG capsule    Sig: Take 1 capsule (60 mg total) by mouth daily.    Dispense:  90 capsule    Refill:  3   gabapentin (NEURONTIN) 300 MG capsule    Sig: Take 2 capsules (600 mg total) by mouth 3 (three) times daily.    Dispense:  360 capsule    Refill:  3   Multiple Vitamin (MULTIVITAMIN WITH MINERALS) TABS tablet    Sig: Take 1 tablet by mouth daily.    Dispense:  90 tablet    Refill:  0   naltrexone (DEPADE) 50 MG tablet    Sig: Take 1 tablet (50 mg total) by mouth daily.    Dispense:  90 tablet    Refill:  3   propranolol (INDERAL) 10 MG tablet    Sig: Take 1 tablet (10 mg total) by mouth 2 (two) times daily.    Dispense:  180 tablet    Refill:  3   QUEtiapine (SEROQUEL) 100 MG tablet    Sig: Take 1 tablet (100 mg total) by mouth at bedtime.    Dispense:  90 tablet    Refill:  3   traZODone (DESYREL) 100 MG tablet    Sig: Take 1 tablet (100 mg total) by mouth at bedtime as needed for sleep.    Dispense:  90 tablet    Refill:  3   diclofenac Sodium (VOLTAREN) 1 % GEL    Sig: Apply 4 g topically 4  (four) times daily.    Dispense:  100 g    Refill:  3     Follow up: 3 months for routine follow up.     We look forward to seeing you next time. Please call our clinic at [phone] if you have any questions or concerns. The best time to call is Monday-Friday from 9am-4pm, but there is someone available 24/7. If after hours or the weekend, call the main hospital number and ask for the Internal Medicine Resident On-Call. If you need medication refills, please notify your pharmacy one week in advance and they will send [place] a request.   Thank you for trusting me with your care. Wishing you the best!  Lovie Macadamia MD [facility]

## 2024-02-08 DIAGNOSIS — M25561 Pain in right knee: Secondary | ICD-10-CM | POA: Insufficient documentation

## 2024-02-08 DIAGNOSIS — R413 Other amnesia: Secondary | ICD-10-CM | POA: Insufficient documentation

## 2024-02-08 NOTE — Assessment & Plan Note (Signed)
 Patient states he has ongoing knee pain.  It is worse when he sits down and needs to get up from a seated position.  This has been going on for some time, and is difficult for him as he walks 4 to 6 miles a day.  He does have recent radiographs which demonstrated right knee effusion.  On exam today he does have persistent effusion, physical exam findings may point towards meniscal tear.  He does endorse instability or mechanical symptoms at times. Plan: Referral to physical therapy Referral to sports medicine for possible knee aspiration, joint injection.

## 2024-02-08 NOTE — Assessment & Plan Note (Signed)
 Patient states he has had significant memory difficulty following his seizures.  He states that he has trouble with object recognition, and has had issues with memory and tasks which were once routine for him.  He is asking to be referred to a neurologist, I will place this referral for him.

## 2024-02-08 NOTE — Progress Notes (Signed)
    Subjective:  CC: Hospital follow-up, knee pain  HPI:  [name] is a 53 y.o. person with a past medical history stated below and presents today for the stated chief complaint. Please see problem based assessment and plan for additional details.  Past Medical History:  Diagnosis Date   Alcoholic (HCC)    Hypertension    Seizures (HCC)     Current Outpatient Medications on File Prior to Visit  Medication Sig Dispense Refill   Cholecalciferol 125 MCG (5000 UT) TABS Take 1 tablet (5,000 Units total) by mouth daily. 90 tablet 0   No current facility-administered medications on file prior to visit.    Review of Systems: Please see assessment and plan for pertinent positives and negatives.  Objective:  There were no vitals filed for this visit.  Physical Exam: Constitutional: Well-appearing Cardiovascular: Regular rate and rhythm Pulmonary/Chest: lungs clear to auscultation bilaterally Abdominal: soft, non-tender, non-distended Extremities: No edema of the lower extremities bilaterally Psych: Very pleasant affect Thought process is linear and is goal-directed.   MSK: Small right knee effusion appreciated.  Mild joint line tenderness.  No tenderness to palpation of the quadriceps or patellar tendon.  Positive McMurray's test.  Negative anterior drawer test.  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  He had originally presented to the behavioral health hospital for alcohol detoxification, his stay was complicated by withdrawal seizure at which time he was transferred to [facility] and admitted to the internal medicine teaching service.  Librium taper was initiated at that time, the patient was seizure-free and was transferred back to behavioral health.  Patient states he has been abstinent from alcohol following his discharge.  He states that he his  last drink was about a month ago.  Denies any seizures in the interim.  I have refilled his medications as outlined by his behavioral health discharge summary.  Memory impairment Patient states he has had significant memory difficulty following his seizures.  He states that he has trouble with object recognition, and has had issues with memory and tasks which were once routine for him.  He is asking to be referred to a neurologist, I will place this referral for him.  Right knee pain Patient states he has ongoing knee pain.  It is worse when he sits down and needs to get up from a seated position.  This has been going on for some time, and is difficult for him as he walks 4 to 6 miles a day.  He does have recent radiographs which demonstrated right knee effusion.  On exam today he does have persistent effusion, physical exam findings may point towards meniscal tear.  He does endorse instability or mechanical symptoms at times. Plan: Referral to physical therapy Referral to sports medicine for possible knee aspiration, joint injection.    Patient discussed with Dr. Julian Reil MD [facility]  PGY-1 Pager: [phone]  Phone: [phone] Date 02/08/2024  Time 2:43 PM

## 2024-02-12 NOTE — Progress Notes (Signed)
 Internal Medicine Clinic Attending  Case discussed with the resident at the time of the visit.  We reviewed the resident's history and exam and pertinent patient test results.  I agree with the assessment, diagnosis, and plan of care documented in the resident's note.

## 2024-02-16 ENCOUNTER — Ambulatory Visit (INDEPENDENT_AMBULATORY_CARE_PROVIDER_SITE_OTHER): Payer: MEDICAID | Admitting: Family Medicine

## 2024-02-16 ENCOUNTER — Encounter: Payer: Self-pay | Admitting: Family Medicine

## 2024-02-16 VITALS — BP 98/68 | Ht 68.0 in | Wt 170.0 lb

## 2024-02-16 DIAGNOSIS — M25561 Pain in right knee: Secondary | ICD-10-CM | POA: Diagnosis not present

## 2024-02-16 MED ORDER — METHYLPREDNISOLONE ACETATE 40 MG/ML IJ SUSP
40.0000 mg | Freq: Once | INTRAMUSCULAR | Status: AC
  Administered 2024-02-16: 40 mg via INTRA_ARTICULAR

## 2024-02-16 NOTE — Progress Notes (Signed)
    CHIEF COMPLAINT / HPI: Right knee pain after a fall where he landed directly on the kneecap.  Has had pain since then it clicks.  When he straightens it fully he has a clicking sensation associated with pain.  This bothers him going up and down steps as well.  Initially had some swelling which is slightly improved.   PERTINENT  PMH / PSH: I have reviewed the patient's medications, allergies, past medical and surgical history, smoking status and updated in the EMR as appropriate. Arthroscopy on that knee in his late teens or early 79s, not sure what it was for.  OBJECTIVE:  BP 98/68   Ht 5\' 8"  (1.727 m)   Wt 170 lb (77.1 kg)   BMI 25.85 kg/m  GENERAL: Well-developed no acute distress KNEE: Right.  Asymmetrical compared with the left.  Distal medial femoral condyle slightly enlarged.  Full range of motion flexion extension.  He does have a clicking sensation with extension which I can also reproduce with patellar movement and hyperextension.  Mild pain with this.  Ligamentously intact to varus and valgus stress.  Negative McMurray.  X-rays reviewed, AP and lateral from 01/23/2024. My personal review:  Chronic osteophyte formation/ bony appearing density noted to the distal medial femoral condyle.  This seems consistent with his exam.  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  Otherwise mild degenerative changes.  Radiologist's official report:No acute fracture or malalignment. Joint spaces are patent. Small knee effusion. Osseous density along the medial femoral condyle not clearly seen on the prior exam. This could be due to remote trauma. There is no associated soft tissue mass nor aggressive radiographic features.  IMPRESSION: No acute osseous abnormality. Small knee effusion.   PROCEDURE: INJECTION: Patient was given informed consent, signed copy in the chart. Appropriate time out was taken. Area prepped and draped in usual sterile fashion. Ethyl chloride was  used for local  anesthesia. A 21 gauge 1 1/2 inch needle was used.. 1 cc of methylprednisolone 40 mg/ml plus  4 cc of 1% lidocaine without epinephrine was injected into the right knee using a(n) anterior medial approach.   The patient tolerated the procedure well. There were no complications. Post procedure instructions were given.  ASSESSMENT / PLAN:   No problem-specific Assessment & Plan notes found for this encounter.   Denny Levy MD

## 2024-02-27 ENCOUNTER — Ambulatory Visit (HOSPITAL_COMMUNITY): Payer: MEDICAID | Admitting: Student

## 2024-03-06 ENCOUNTER — Ambulatory Visit (HOSPITAL_COMMUNITY): Payer: MEDICAID | Admitting: Licensed Clinical Social Worker

## 2024-03-13 ENCOUNTER — Ambulatory Visit (HOSPITAL_COMMUNITY): Payer: MEDICAID | Admitting: Physician Assistant

## 2024-03-15 ENCOUNTER — Emergency Department (HOSPITAL_COMMUNITY): Payer: MEDICAID

## 2024-03-15 ENCOUNTER — Emergency Department (HOSPITAL_COMMUNITY)
Admission: EM | Admit: 2024-03-15 | Discharge: 2024-03-16 | Disposition: A | Discharge status: Home / Self Care | Payer: MEDICAID | Attending: Emergency Medicine | Admitting: Emergency Medicine

## 2024-03-15 ENCOUNTER — Other Ambulatory Visit: Payer: Self-pay

## 2024-03-15 ENCOUNTER — Encounter (HOSPITAL_COMMUNITY): Payer: Self-pay

## 2024-03-15 DIAGNOSIS — Y908 Blood alcohol level of 240 mg/100 ml or more: Secondary | ICD-10-CM | POA: Diagnosis not present

## 2024-03-15 DIAGNOSIS — W07XXXA Fall from chair, initial encounter: Secondary | ICD-10-CM | POA: Diagnosis not present

## 2024-03-15 DIAGNOSIS — F10920 Alcohol use, unspecified with intoxication, uncomplicated: Secondary | ICD-10-CM

## 2024-03-15 DIAGNOSIS — W19XXXA Unspecified fall, initial encounter: Secondary | ICD-10-CM

## 2024-03-15 DIAGNOSIS — S00511A Abrasion of lip, initial encounter: Secondary | ICD-10-CM | POA: Diagnosis not present

## 2024-03-15 DIAGNOSIS — S0031XA Abrasion of nose, initial encounter: Secondary | ICD-10-CM | POA: Diagnosis not present

## 2024-03-15 DIAGNOSIS — S0081XA Abrasion of other part of head, initial encounter: Secondary | ICD-10-CM | POA: Diagnosis present

## 2024-03-15 DIAGNOSIS — E876 Hypokalemia: Secondary | ICD-10-CM | POA: Diagnosis not present

## 2024-03-15 DIAGNOSIS — F10129 Alcohol abuse with intoxication, unspecified: Secondary | ICD-10-CM | POA: Diagnosis not present

## 2024-03-15 LAB — COMPREHENSIVE METABOLIC PANEL WITH GFR
ALT: 21 U/L (ref 0–44)
AST: 55 U/L — ABNORMAL HIGH (ref 15–41)
Albumin: 4 g/dL (ref 3.5–5.0)
Alkaline Phosphatase: 92 U/L (ref 38–126)
Anion gap: 22 — ABNORMAL HIGH (ref 5–15)
BUN: <5 mg/dL — ABNORMAL LOW (ref 6–20)
CO2: 19 mmol/L — ABNORMAL LOW (ref 22–32)
Calcium: 8.6 mg/dL — ABNORMAL LOW (ref 8.9–10.3)
Chloride: 101 mmol/L (ref 98–111)
Creatinine, Ser: 0.79 mg/dL (ref 0.61–1.24)
GFR, Estimated: 59 mL/min — ABNORMAL LOW (ref >60)
Glucose, Bld: 90 mg/dL (ref 70–99)
Potassium: 3.3 mmol/L — ABNORMAL LOW (ref 3.5–5.1)
Sodium: 142 mmol/L (ref 135–145)
Total Bilirubin: 0.7 mg/dL (ref 0.0–1.2)
Total Protein: 8 g/dL (ref 6.5–8.1)

## 2024-03-15 LAB — I-STAT CHEM 8, ED
BUN: <3 mg/dL — ABNORMAL LOW (ref 6–20)
Calcium, Ion: 0.89 mmol/L — CL (ref 1.15–1.40)
Chloride: 102 mmol/L (ref 98–111)
Creatinine, Ser: 1.3 mg/dL — ABNORMAL HIGH (ref 0.61–1.24)
Glucose, Bld: 93 mg/dL (ref 70–99)
HCT: 44 % (ref 39.0–52.0)
Hemoglobin: 15 g/dL (ref 13.0–17.0)
Potassium: 3.1 mmol/L — ABNORMAL LOW (ref 3.5–5.1)
Sodium: 142 mmol/L (ref 135–145)
TCO2: 20 mmol/L — ABNORMAL LOW (ref 22–32)

## 2024-03-15 LAB — CBC WITH DIFFERENTIAL/PLATELET
Abs Immature Granulocytes: 0.05 10*3/uL (ref 0.00–0.07)
Basophils Absolute: 0.1 10*3/uL (ref 0.0–0.1)
Basophils Relative: 1 %
Eosinophils Absolute: 0 10*3/uL (ref 0.0–0.5)
Eosinophils Relative: 0 %
HCT: 41.4 % (ref 39.0–52.0)
Hemoglobin: 13.3 g/dL (ref 13.0–17.0)
Immature Granulocytes: 1 %
Lymphocytes Relative: 14 %
Lymphs Abs: 1.3 10*3/uL (ref 0.7–4.0)
MCH: 28.7 pg (ref 26.0–34.0)
MCHC: 32.1 g/dL (ref 30.0–36.0)
MCV: 89.2 fL (ref 80.0–100.0)
Monocytes Absolute: 0.7 10*3/uL (ref 0.1–1.0)
Monocytes Relative: 7 %
Neutro Abs: 7.3 10*3/uL (ref 1.7–7.7)
Neutrophils Relative %: 77 %
Platelets: 302 10*3/uL (ref 150–400)
RBC: 4.64 MIL/uL (ref 4.22–5.81)
RDW: 14 % (ref 11.5–15.5)
WBC: 9.4 10*3/uL (ref 4.0–10.5)
nRBC: 0 % (ref 0.0–0.2)

## 2024-03-15 LAB — AMMONIA: Ammonia: 36 umol/L — ABNORMAL HIGH (ref 9–35)

## 2024-03-15 LAB — ETHANOL: Alcohol, Ethyl (B): 495 mg/dL (ref <15)

## 2024-03-15 LAB — CBG MONITORING, ED: Glucose-Capillary: 97 mg/dL (ref 70–99)

## 2024-03-15 MED ORDER — LORAZEPAM 2 MG/ML PO CONC: 2.0000 mg | Freq: Once | ORAL | Status: DC

## 2024-03-15 MED ORDER — LORAZEPAM 2 MG/ML IJ SOLN: 0.0000 mg | Freq: Four times a day (QID) | INTRAMUSCULAR | Status: DC

## 2024-03-15 MED ORDER — LORAZEPAM 2 MG/ML IJ SOLN
2.0000 mg | Freq: Once | INTRAMUSCULAR | Status: AC
  Administered 2024-03-15: 2 mg via INTRAVENOUS
  Filled 2024-03-15: qty 1

## 2024-03-15 MED ORDER — THIAMINE MONONITRATE 100 MG PO TABS: 100.0000 mg | ORAL_TABLET | Freq: Every day | ORAL | Status: DC

## 2024-03-15 MED ORDER — LORAZEPAM 2 MG/ML IJ SOLN
INTRAMUSCULAR | Status: AC
  Filled 2024-03-15: qty 1

## 2024-03-15 MED ORDER — MAGNESIUM SULFATE 2 GM/50ML IV SOLN
2.0000 g | Freq: Once | INTRAVENOUS | Status: AC
  Administered 2024-03-15: 2 g via INTRAVENOUS
  Filled 2024-03-15: qty 50

## 2024-03-15 MED ORDER — HALOPERIDOL LACTATE 5 MG/ML IJ SOLN
2.5000 mg | Freq: Once | INTRAMUSCULAR | Status: AC
  Administered 2024-03-15: 2.5 mg via INTRAVENOUS
  Filled 2024-03-15: qty 1

## 2024-03-15 MED ORDER — THIAMINE HCL 100 MG/ML IJ SOLN
100.0000 mg | Freq: Every day | INTRAMUSCULAR | Status: DC
  Administered 2024-03-15: 100 mg via INTRAVENOUS
  Filled 2024-03-15: qty 2

## 2024-03-15 MED ORDER — LORAZEPAM 1 MG PO TABS: 0.0000 mg | ORAL_TABLET | Freq: Two times a day (BID) | ORAL | Status: DC

## 2024-03-15 MED ORDER — ZIPRASIDONE MESYLATE 20 MG IM SOLR
20.0000 mg | Freq: Once | INTRAMUSCULAR | Status: AC
  Administered 2024-03-15: 20 mg via INTRAMUSCULAR
  Filled 2024-03-15: qty 20

## 2024-03-15 MED ORDER — LACTATED RINGERS IV BOLUS
1000.0000 mL | Freq: Once | INTRAVENOUS | Status: AC
  Administered 2024-03-16: 1000 mL via INTRAVENOUS

## 2024-03-15 MED ORDER — LORAZEPAM 2 MG/ML IJ SOLN: 0.0000 mg | Freq: Two times a day (BID) | INTRAMUSCULAR | Status: DC

## 2024-03-15 MED ORDER — LORAZEPAM 1 MG PO TABS: 0.0000 mg | ORAL_TABLET | Freq: Four times a day (QID) | ORAL | Status: DC

## 2024-03-15 NOTE — ED Notes (Signed)
 Patients bed alarm is going off every 5 minutes, patient is confused and highly intoxicated. Patient is redirectable for a short amount of time.

## 2024-03-15 NOTE — ED Notes (Signed)
 Patient transported to CT

## 2024-03-15 NOTE — ED Notes (Signed)
 EDP Abigail notified of critical lab value.   Ethanol: 495

## 2024-03-15 NOTE — ED Notes (Signed)
 Patient fell while trying to take ankle restraints off. EDP Abigail notified and at bedside.

## 2024-03-15 NOTE — ED Triage Notes (Signed)
 Patient bib EMS from a front yard that does not belong to him. He has nose abrasions,and appeared to have fallen forward out of a rocking chair. Patient is heavily intoxicated and will not disclose how much he has been drinking today. Patient was combative with EMS and Alert to self and location.   Patient in restraints on arrival to ED.

## 2024-03-15 NOTE — ED Notes (Signed)
 Staffing office called, sitter should be here shortly.

## 2024-03-15 NOTE — ED Provider Notes (Signed)
 [place] [facility] Provider Note   CSN: [id number] Arrival date & time: 03/15/24  1656     History {Add pertinent medical, surgical, social history, OB history to HPI:1} Chief Complaint  Patient presents with  . Alcohol Intoxication  . Fall         Xavier Owens is a 53 y.o. male brought in by EMS for trauma/apparent intoxication.  Found down in the neighbors yard.  EMS talked to a neighbor who told him his was Xavier Owens but would not tell them anything else and went inside when he tried to ask further at questions.  EMS reports that it appears he fell out of a rocking chair from a porch into a pile of mulch because he was covered in mulch and there was a little bit of blood.  He has obvious abrasions to the face.  Patient was combative and threatening to EMS prior to arrival.  He was placed in gurney cuffs and given IM Versed.  He is more relaxed but still making occasional threats to staff.  EMS report that they attempted to place a c-collar but the patient would not allow this and became violent and was thrashing about.  Patient now stating his name is Xavier Owens.  Smells significantly of alcohol.   Alcohol Intoxication  Fall      Home Medications Prior to Admission medications   Not on File      Allergies    Patient has no allergy information on record.    Review of Systems   Review of Systems  Physical Exam Updated Vital Signs BP 105/70   Pulse 83   Temp 98.5 F (36.9 C) (Oral)   Resp 12   Ht 5\' 7"  (1.702 m)   Wt 81.6 kg   SpO2 96%   BMI 28.19 kg/m  Physical Exam Vitals and nursing note reviewed.  Constitutional:      General: He is not in acute distress.    Appearance: He is well-developed. He is not diaphoretic.  HENT:     Head: Normocephalic.     Comments: Multiple abrasions noted to the front of the patient's nose and upper lip.  There is dried blood.  No obvious dental fractures.  He is moving his jaw  effectively. Eyes:     General: No scleral icterus.    Conjunctiva/sclera: Conjunctivae normal.  Cardiovascular:     Rate and Rhythm: Normal rate and regular rhythm.     Heart sounds: Normal heart sounds.  Pulmonary:     Effort: Pulmonary effort is normal. No respiratory distress.     Breath sounds: Normal breath sounds.  Abdominal:     Palpations: Abdomen is soft.     Tenderness: There is no abdominal tenderness.  Musculoskeletal:        General: Normal range of motion.     Cervical back: Normal range of motion and neck supple.     Comments: Patient moving all extremities very well he is tugging at his soft restraints in the bed. He has multiple bruises and abrasions all over his body in various states of healing suggestive of chronic injuries  Skin:    General: Skin is warm and dry.  Neurological:     Mental Status: He is alert.     Comments: Speech is slurred.  Patient's neurologic exam is most consistent with acute alcohol intoxication  Psychiatric:        Behavior: Behavior normal.    ED Results /  Procedures / Treatments   Labs (all labs ordered are listed, but only abnormal results are displayed) Labs Reviewed  COMPREHENSIVE METABOLIC PANEL WITH GFR - Abnormal; Notable for the following components:      Result Value   Potassium 3.3 (*)    CO2 19 (*)    BUN <5 (*)    Calcium  8.6 (*)    AST 55 (*)    GFR, Estimated 59 (*)    Anion gap 22 (*)    All other components within normal limits  AMMONIA - Abnormal; Notable for the following components:   Ammonia 36 (*)    All other components within normal limits  ETHANOL - Abnormal; Notable for the following components:   Alcohol, Ethyl (B) 495 (*)    All other components within normal limits  I-STAT CHEM 8, ED - Abnormal; Notable for the following components:   Potassium 3.1 (*)    BUN <3 (*)    Creatinine, Ser 1.30 (*)    Calcium , Ion 0.89 (*)    TCO2 20 (*)    All other components within normal limits  CBC WITH  DIFFERENTIAL/PLATELET  RAPID URINE DRUG SCREEN, HOSP PERFORMED  CBG MONITORING, ED    EKG None  Radiology DG Chest Port 1 View Result Date: 03/15/2024 CLINICAL DATA:  Trauma due to a fall. Patient fell forward abdomen rocking chair with abrasions to the nose. Intoxicated. EXAM: PORTABLE CHEST 1 VIEW COMPARISON:  None Available. FINDINGS: Shallow inspiration. Heart size and pulmonary vascularity are normal for technique. Lungs are clear. No pleural effusion or pneumothorax. Mediastinal contours appear intact. Visualized bones are nondisplaced. IMPRESSION: Shallow inspiration.  No evidence of active pulmonary disease. Electronically Signed   By: Boyce Byes M.D.   On: 03/15/2024 17:43    Procedures .Critical Care  Performed by: Tama Fails, [name] Authorized by: Tama Fails, [name]   Critical care provider statement:    Critical care time (minutes):  60   Critical care was necessary to treat or prevent imminent or life-threatening deterioration of the following conditions:  Toxidrome (harmful behavior, restraints)   Critical care was time spent personally by me on the following activities:  Development of treatment plan with patient or surrogate, discussions with consultants, evaluation of patient's response to treatment, examination of patient, ordering and review of laboratory studies, ordering and review of radiographic studies, ordering and performing treatments and interventions, pulse oximetry, re-evaluation of patient's condition and review of old charts   {Document cardiac monitor, telemetry assessment procedure when appropriate:1}  Medications Ordered in ED Medications  LORazepam  (ATIVAN ) 2 MG/ML concentrated solution 2 mg (has no administration in time range)    ED Course/ Medical Decision Making/ A&P Clinical Course as of 03/17/24 1824  Fri Mar 15, 2024  1920 Ammonia(!): 36 [AH]  1920 Alcohol, Ethyl (B)(!!): 495 [AH]  1920 Anion gap(!): 22 Anion gap likely  secondary to alcoholic ketosis [AH]  2003 I got a call from patient's partner Trevor Fudge states that she is terrified the patient is going to die because he has been "out of his mind drunk for the past 3 days."  She has reports a history of severe dependence and seizures.  She states that he needs to be watched very thoroughly because he will go into withdrawal as soon as his alcohol levels come down a bit.  Patient currently is a bit more sober he is removed his upper extremity restraints currently not fighting anyone however is trying to get out of bed we  will give him some Ativan  at this time continue to monitor him we will consider removing the restraints on his ankle shortly.  He is also on a Posey belt for safety. [AH]  2034 Patient given 2 mg of IV Ativan  he is more agitated now.  Will calculate his score and give some Haldol  so we can complete our trauma assessment. [AH]  2045 I was called to the patient's room because he got out of his foot restraint fell out of the bed and face planted onto the floor.  He now busted his lip.  I have added Haldol  and he is placed back on 4 point restraints.  Patient obviously still needs traumatic scans and has not gotten them yet so we will hopefully get his CT maxillofacial head and neck done shortly.  CIWA score is now day 2:05 of Ativan .  Patient is clearly unsafe not to be in restraints as he is still very intoxicated and a danger to himself [AH]    Clinical Course User Index [AH] Seville Downs, [name]   {   Click here for ABCD2, HEART and other calculatorsREFRESH Note before signing :1}                              Medical Decision Making Amount and/or Complexity of Data Reviewed Labs: ordered. Decision-making details documented in ED Course. Radiology: ordered.  Risk Prescription drug management.   Patient here with ETOH intoxication and blunt facial trauma. Ddx concerning for facial frxs, SDH, sckull fractures dental injury  {Document critical  care time when appropriate:1} {Document review of labs and clinical decision tools ie heart score, Chads2Vasc2 etc:1}  {Document your independent review of radiology images, and any outside records:1} {Document your discussion with family members, caretakers, and with consultants:1} {Document social determinants of health affecting pt's care:1} {Document your decision making why or why not admission, treatments were needed:1} Final Clinical Impression(s) / ED Diagnoses Final diagnoses:  None    Rx / DC Orders ED Discharge Orders     None

## 2024-03-15 NOTE — ED Notes (Signed)
 Patients heart rate has became tachycardic, EDP notified, EKG obtained and new orders placed

## 2024-03-15 NOTE — ED Notes (Signed)
 Non slip socks, bed alarm on, door open, and fall risk sign on door.

## 2024-03-15 NOTE — ED Provider Notes (Signed)
 Physical Exam  BP (!) 136/91   Pulse (!) 135   Temp (!) 96.5 F (35.8 C) (Axillary)   Resp 15   Ht 5\' 7"  (1.702 m)   Wt 81.6 kg   SpO2 100%   BMI 28.19 kg/m   Physical Exam  Procedures  Procedures  ED Course / MDM   Clinical Course as of 03/15/24 2340  Fri Mar 15, 2024  1920 Ammonia(!): 36 [AH]  1920 Alcohol, Ethyl (B)(!!): 495 [AH]  1920 Anion gap(!): 22 Anion gap likely secondary to alcoholic ketosis [AH]  2003 I got a call from patient's partner Xavier Owens states that she is terrified the patient is going to die because he has been "out of his mind drunk for the past 3 days."  She has reports a history of severe dependence and seizures.  She states that he needs to be watched very thoroughly because he will go into withdrawal as soon as his alcohol levels come down a bit.  Patient currently is a bit more sober he is removed his upper extremity restraints currently not fighting anyone however is trying to get out of bed we will give him some Ativan  at this time continue to monitor him we will consider removing the restraints on his ankle shortly.  He is also on a Posey belt for safety. [AH]  2034 Patient given 2 mg of IV Ativan  he is more agitated now.  Will calculate his score and give some Haldol  so we can complete our trauma assessment. [AH]  2045 I was called to the patient's room because he got out of his foot restraint fell out of the bed and face planted onto the floor.  He now busted his lip.  I have added Haldol  and he is placed back on 4 point restraints.  Patient obviously still needs traumatic scans and has not gotten them yet so we will hopefully get his CT maxillofacial head and neck done shortly.  CIWA score is now day 2:05 of Ativan .  Patient is clearly unsafe not to be in restraints as he is still very intoxicated and a danger to himself [AH]    Clinical Course User Index [AH] Harris, Abigail, [name]    Presented with concern for alcohol intoxication and fall.  Unfortunately, due to his level of severe intoxication, he has been agitated, trying to get out of bed and fell.  Had been given other doses of ativan  but at this time in order to obtain the trauma scans we require, he was given geodon  20mg  IM.  After this, we were able to complete the CTs which gratefully show no evidence of intracranial bleed or fracture.  He will follow some commands after being woken and answer some yes no questions, name  "Xavier Owens" but is very sedated. He has developed tachycardia--ECG shows sinus tachycardia.  QTc mildly prolonged and given Mg.  He is high risk of alcohol withdrawal. His significant other desperately wants him to get help for his alcohol abuse.  Would like to have him in state to see if he agrees to receiving help--in which case he may require hospital admission for detox//withdrawal.  At this time it is unclear to me if tachycardia secondary to etoh withdrawal as he is intoxicated by etoh and medications.  He is sleepy but does indicate possible interest in detox.  Will continue to monitor at this time for clinical sobriety and treat withdrawal as his intoxication wanes and withdrawal begins if he desires detox.  Xavier Currier, MD 03/16/24 [phone]

## 2024-03-15 NOTE — ED Provider Notes (Incomplete)
  Physical Exam  BP (!) 136/91   Pulse (!) 135   Temp (!) 96.5 F (35.8 C) (Axillary)   Resp 15   Ht 5\' 7"  (1.702 m)   Wt 81.6 kg   SpO2 100%   BMI 28.19 kg/m   Physical Exam  Procedures  Procedures  ED Course / MDM   Clinical Course as of 03/15/24 2340  Fri Mar 15, 2024  1920 Ammonia(!): 36 [AH]  1920 Alcohol, Ethyl (B)(!!): 495 [AH]  1920 Anion gap(!): 22 Anion gap likely secondary to alcoholic ketosis [AH]  2003 I got a call from patient's partner Trevor Fudge states that she is terrified the patient is going to die because he has been "out of his mind drunk for the past 3 days."  She has reports a history of severe dependence and seizures.  She states that he needs to be watched very thoroughly because he will go into withdrawal as soon as his alcohol levels come down a bit.  Patient currently is a bit more sober he is removed his upper extremity restraints currently not fighting anyone however is trying to get out of bed we will give him some Ativan  at this time continue to monitor him we will consider removing the restraints on his ankle shortly.  He is also on a Posey belt for safety. [AH]  2034 Patient given 2 mg of IV Ativan  he is more agitated now.  Will calculate his score and give some Haldol  so we can complete our trauma assessment. [AH]  2045 I was called to the patient's room because he got out of his foot restraint fell out of the bed and face planted onto the floor.  He now busted his lip.  I have added Haldol  and he is placed back on 4 point restraints.  Patient obviously still needs traumatic scans and has not gotten them yet so we will hopefully get his CT maxillofacial head and neck done shortly.  CIWA score is now day 2:05 of Ativan .  Patient is clearly unsafe not to be in restraints as he is still very intoxicated and a danger to himself [AH]    Clinical Course User Index [AH] Tama Fails, [name]    Presented with concern for alcohol intoxication and fall.  Unfortunately, due to his level of severe intoxication, he has been agitated, trying to get out of bed and fell.  Had been given other doses of ativan  but at this time in order to obtain the trauma scans we require, he was given geodon  20mg  IM.  After this, we were able to complete the CTs which gratefully show no evidence of intracranial bleed or fracture.  He will follow some commands after being woken and answer some yes no questions, name  "Xavier Owens" but is very sedated. He has developed tachycardia--ECG shows sinus tachycardia.  QTc mildly prolonged and given Mg.  He is high risk of alcohol withdrawal. His significant other desperately wants him to get help for his alcohol abuse.  Would like to have him in state to see if he agrees to receiving help--in which case he may require hospital admission for detox//withdrawal.  At this time it is unclear to me if tachycardia secondary to

## 2024-03-15 NOTE — ED Notes (Signed)
 Patient found sitting on the end of the bed trying to take apart ankle restraints and get them off, patient had pulled his wrist restraints off. Patient is not agitated at this time. He is just very confused. I told him I would leave his wrist restraints off but he needed to stay in the bed.

## 2024-03-16 ENCOUNTER — Inpatient Hospital Stay (HOSPITAL_COMMUNITY)
Admission: EM | Admit: 2024-03-16 | Discharge: 2024-03-22 | DRG: 896 | Disposition: A | Discharge status: Home / Self Care | Payer: MEDICAID | Attending: Internal Medicine | Admitting: Internal Medicine

## 2024-03-16 ENCOUNTER — Emergency Department (HOSPITAL_COMMUNITY): Payer: MEDICAID

## 2024-03-16 ENCOUNTER — Observation Stay (HOSPITAL_COMMUNITY): Payer: MEDICAID

## 2024-03-16 ENCOUNTER — Encounter (HOSPITAL_COMMUNITY): Payer: Self-pay | Admitting: *Deleted

## 2024-03-16 ENCOUNTER — Other Ambulatory Visit: Payer: Self-pay

## 2024-03-16 DIAGNOSIS — Z5900 Homelessness unspecified: Secondary | ICD-10-CM

## 2024-03-16 DIAGNOSIS — E876 Hypokalemia: Secondary | ICD-10-CM | POA: Diagnosis present

## 2024-03-16 DIAGNOSIS — Y908 Blood alcohol level of 240 mg/100 ml or more: Secondary | ICD-10-CM | POA: Diagnosis present

## 2024-03-16 DIAGNOSIS — R112 Nausea with vomiting, unspecified: Secondary | ICD-10-CM | POA: Diagnosis present

## 2024-03-16 DIAGNOSIS — E8889 Other specified metabolic disorders: Secondary | ICD-10-CM

## 2024-03-16 DIAGNOSIS — Z639 Problem related to primary support group, unspecified: Secondary | ICD-10-CM

## 2024-03-16 DIAGNOSIS — E872 Acidosis, unspecified: Secondary | ICD-10-CM | POA: Diagnosis not present

## 2024-03-16 DIAGNOSIS — Z79899 Other long term (current) drug therapy: Secondary | ICD-10-CM

## 2024-03-16 DIAGNOSIS — Z9181 History of falling: Secondary | ICD-10-CM

## 2024-03-16 DIAGNOSIS — F10231 Alcohol dependence with withdrawal delirium: Secondary | ICD-10-CM | POA: Diagnosis not present

## 2024-03-16 DIAGNOSIS — E8721 Acute metabolic acidosis: Principal | ICD-10-CM

## 2024-03-16 DIAGNOSIS — F10939 Alcohol use, unspecified with withdrawal, unspecified: Secondary | ICD-10-CM

## 2024-03-16 DIAGNOSIS — F329 Major depressive disorder, single episode, unspecified: Secondary | ICD-10-CM

## 2024-03-16 DIAGNOSIS — S0083XA Contusion of other part of head, initial encounter: Secondary | ICD-10-CM | POA: Diagnosis present

## 2024-03-16 DIAGNOSIS — K760 Fatty (change of) liver, not elsewhere classified: Secondary | ICD-10-CM | POA: Diagnosis present

## 2024-03-16 DIAGNOSIS — F1010 Alcohol abuse, uncomplicated: Secondary | ICD-10-CM | POA: Diagnosis not present

## 2024-03-16 DIAGNOSIS — K2970 Gastritis, unspecified, without bleeding: Secondary | ICD-10-CM | POA: Diagnosis present

## 2024-03-16 DIAGNOSIS — D649 Anemia, unspecified: Secondary | ICD-10-CM | POA: Diagnosis present

## 2024-03-16 DIAGNOSIS — F10931 Alcohol use, unspecified with withdrawal delirium: Secondary | ICD-10-CM

## 2024-03-16 DIAGNOSIS — F17210 Nicotine dependence, cigarettes, uncomplicated: Secondary | ICD-10-CM | POA: Diagnosis present

## 2024-03-16 DIAGNOSIS — E860 Dehydration: Secondary | ICD-10-CM

## 2024-03-16 DIAGNOSIS — G629 Polyneuropathy, unspecified: Secondary | ICD-10-CM

## 2024-03-16 DIAGNOSIS — Z560 Unemployment, unspecified: Secondary | ICD-10-CM

## 2024-03-16 DIAGNOSIS — W06XXXA Fall from bed, initial encounter: Secondary | ICD-10-CM | POA: Diagnosis present

## 2024-03-16 DIAGNOSIS — R32 Unspecified urinary incontinence: Secondary | ICD-10-CM | POA: Diagnosis present

## 2024-03-16 DIAGNOSIS — F332 Major depressive disorder, recurrent severe without psychotic features: Secondary | ICD-10-CM | POA: Diagnosis present

## 2024-03-16 DIAGNOSIS — F22 Delusional disorders: Secondary | ICD-10-CM | POA: Diagnosis present

## 2024-03-16 DIAGNOSIS — Z7901 Long term (current) use of anticoagulants: Secondary | ICD-10-CM

## 2024-03-16 DIAGNOSIS — Z781 Physical restraint status: Secondary | ICD-10-CM

## 2024-03-16 DIAGNOSIS — R251 Tremor, unspecified: Secondary | ICD-10-CM | POA: Insufficient documentation

## 2024-03-16 DIAGNOSIS — J690 Pneumonitis due to inhalation of food and vomit: Secondary | ICD-10-CM | POA: Diagnosis present

## 2024-03-16 DIAGNOSIS — G250 Essential tremor: Secondary | ICD-10-CM | POA: Diagnosis present

## 2024-03-16 DIAGNOSIS — F39 Unspecified mood [affective] disorder: Secondary | ICD-10-CM | POA: Insufficient documentation

## 2024-03-16 DIAGNOSIS — F10229 Alcohol dependence with intoxication, unspecified: Principal | ICD-10-CM | POA: Diagnosis present

## 2024-03-16 DIAGNOSIS — F1090 Alcohol use, unspecified, uncomplicated: Secondary | ICD-10-CM | POA: Insufficient documentation

## 2024-03-16 DIAGNOSIS — Z716 Tobacco abuse counseling: Secondary | ICD-10-CM

## 2024-03-16 HISTORY — DX: Tremor, unspecified: R25.1

## 2024-03-16 LAB — CBC
HCT: 37.1 % — ABNORMAL LOW (ref 39.0–52.0)
HCT: 43.5 % (ref 39.0–52.0)
Hemoglobin: 12.1 g/dL — ABNORMAL LOW (ref 13.0–17.0)
Hemoglobin: 13.9 g/dL (ref 13.0–17.0)
MCH: 28.5 pg (ref 26.0–34.0)
MCH: 28.8 pg (ref 26.0–34.0)
MCHC: 32 g/dL (ref 30.0–36.0)
MCHC: 32.6 g/dL (ref 30.0–36.0)
MCV: 88.3 fL (ref 80.0–100.0)
MCV: 89.1 fL (ref 80.0–100.0)
Platelets: 229 10*3/uL (ref 150–400)
Platelets: 323 10*3/uL (ref 150–400)
RBC: 4.2 MIL/uL — ABNORMAL LOW (ref 4.22–5.81)
RBC: 4.88 MIL/uL (ref 4.22–5.81)
RDW: 13.7 % (ref 11.5–15.5)
RDW: 14 % (ref 11.5–15.5)
WBC: 11.8 10*3/uL — ABNORMAL HIGH (ref 4.0–10.5)
WBC: 17.2 10*3/uL — ABNORMAL HIGH (ref 4.0–10.5)
nRBC: 0 % (ref 0.0–0.2)
nRBC: 0 % (ref 0.0–0.2)

## 2024-03-16 LAB — I-STAT CG4 LACTIC ACID, ED
Lactic Acid, Venous: 8 mmol/L (ref 0.5–1.9)
Lactic Acid, Venous: 6.6 mmol/L (ref 0.5–1.9)

## 2024-03-16 LAB — RAPID URINE DRUG SCREEN, HOSP PERFORMED
Amphetamines: NOT DETECTED
Barbiturates: NOT DETECTED
Benzodiazepines: POSITIVE — AB
Cocaine: NOT DETECTED
Opiates: NOT DETECTED
Tetrahydrocannabinol: NOT DETECTED

## 2024-03-16 LAB — BASIC METABOLIC PANEL WITH GFR
Anion gap: 28 — ABNORMAL HIGH (ref 5–15)
BUN: <5 mg/dL — ABNORMAL LOW (ref 6–20)
CO2: 18 mmol/L — ABNORMAL LOW (ref 22–32)
Calcium: 9.6 mg/dL (ref 8.9–10.3)
Chloride: 95 mmol/L — ABNORMAL LOW (ref 98–111)
Creatinine, Ser: 0.97 mg/dL (ref 0.61–1.24)
GFR, Estimated: >60 mL/min (ref >60)
Glucose, Bld: 104 mg/dL — ABNORMAL HIGH (ref 70–99)
Potassium: 3.4 mmol/L — ABNORMAL LOW (ref 3.5–5.1)
Sodium: 141 mmol/L (ref 135–145)

## 2024-03-16 LAB — I-STAT VENOUS BLOOD GAS, ED
Acid-Base Excess: 3 mmol/L — ABNORMAL HIGH (ref 0.0–2.0)
Bicarbonate: 25.4 mmol/L (ref 20.0–28.0)
Calcium, Ion: 0.97 mmol/L — ABNORMAL LOW (ref 1.15–1.40)
HCT: 42 % (ref 39.0–52.0)
Hemoglobin: 14.3 g/dL (ref 13.0–17.0)
O2 Saturation: 76 %
Potassium: 3.6 mmol/L (ref 3.5–5.1)
Sodium: 138 mmol/L (ref 135–145)
TCO2: 26 mmol/L (ref 22–32)
pCO2, Ven: 33.1 mmHg — ABNORMAL LOW (ref 44–60)
pH, Ven: 7.493 — ABNORMAL HIGH (ref 7.25–7.43)
pO2, Ven: 37 mmHg (ref 32–45)

## 2024-03-16 LAB — TROPONIN I (HIGH SENSITIVITY)
Troponin I (High Sensitivity): 7 ng/L (ref <18)
Troponin I (High Sensitivity): 7 ng/L (ref <18)

## 2024-03-16 LAB — ETHANOL: Alcohol, Ethyl (B): <15 mg/dL (ref <15)

## 2024-03-16 MED ORDER — SODIUM CHLORIDE 0.9 % IV SOLN
INTRAVENOUS | Status: DC
Start: 2024-03-16 — End: 2024-03-16

## 2024-03-16 MED ORDER — IOHEXOL 350 MG/ML SOLN
75.0000 mL | Freq: Once | INTRAVENOUS | Status: AC | PRN
  Administered 2024-03-16: 75 mL via INTRAVENOUS

## 2024-03-16 MED ORDER — THIAMINE HCL 100 MG/ML IJ SOLN
500.0000 mg | Freq: Once | INTRAVENOUS | Status: AC
  Administered 2024-03-16: 500 mg via INTRAVENOUS
  Filled 2024-03-16: qty 5

## 2024-03-16 MED ORDER — TRAZODONE HCL 100 MG PO TABS
100.0000 mg | ORAL_TABLET | Freq: Every day | ORAL | Status: DC
  Administered 2024-03-16 – 2024-03-19 (×4): 100 mg via ORAL
  Filled 2024-03-16: qty 2
  Filled 2024-03-16: qty 1
  Filled 2024-03-16 (×2): qty 2

## 2024-03-16 MED ORDER — ENOXAPARIN SODIUM 40 MG/0.4ML IJ SOSY
40.0000 mg | PREFILLED_SYRINGE | INTRAMUSCULAR | Status: DC
  Administered 2024-03-16 – 2024-03-20 (×5): 40 mg via SUBCUTANEOUS
  Filled 2024-03-16 (×5): qty 0.4

## 2024-03-16 MED ORDER — LORAZEPAM 2 MG/ML IJ SOLN
0.5000 mg | Freq: Once | INTRAMUSCULAR | Status: AC
  Administered 2024-03-16: 0.5 mg via INTRAVENOUS
  Filled 2024-03-16: qty 1

## 2024-03-16 MED ORDER — CHLORDIAZEPOXIDE HCL 25 MG PO CAPS: ORAL_CAPSULE | ORAL | 0 refills | Status: DC

## 2024-03-16 MED ORDER — THIAMINE HCL 100 MG/ML IJ SOLN
100.0000 mg | Freq: Every day | INTRAMUSCULAR | Status: DC
  Administered 2024-03-17: 100 mg via INTRAVENOUS
  Filled 2024-03-16 (×4): qty 2

## 2024-03-16 MED ORDER — LORAZEPAM 2 MG/ML IJ SOLN
0.0000 mg | INTRAMUSCULAR | Status: DC
  Administered 2024-03-16: 1 mg via INTRAVENOUS
  Administered 2024-03-17: 2 mg via INTRAVENOUS
  Administered 2024-03-17 (×2): 1 mg via INTRAVENOUS
  Administered 2024-03-17: 2 mg via INTRAVENOUS
  Administered 2024-03-17: 1 mg via INTRAVENOUS
  Administered 2024-03-18 (×2): 4 mg via INTRAVENOUS
  Administered 2024-03-18 (×2): 2 mg via INTRAVENOUS
  Filled 2024-03-16 (×3): qty 1
  Filled 2024-03-16: qty 2
  Filled 2024-03-16 (×3): qty 1
  Filled 2024-03-16: qty 2
  Filled 2024-03-16 (×2): qty 1

## 2024-03-16 MED ORDER — LORAZEPAM 1 MG PO TABS
1.0000 mg | ORAL_TABLET | ORAL | Status: DC | PRN
  Administered 2024-03-18: 4 mg via ORAL
  Filled 2024-03-16: qty 4

## 2024-03-16 MED ORDER — ADULT MULTIVITAMIN W/MINERALS CH
1.0000 | ORAL_TABLET | Freq: Every day | ORAL | Status: DC
  Administered 2024-03-16 – 2024-03-20 (×5): 1 via ORAL
  Filled 2024-03-16 (×7): qty 1

## 2024-03-16 MED ORDER — PROPRANOLOL HCL 10 MG PO TABS
10.0000 mg | ORAL_TABLET | Freq: Two times a day (BID) | ORAL | Status: DC
  Administered 2024-03-16 – 2024-03-22 (×5): 10 mg via ORAL
  Filled 2024-03-16 (×13): qty 1

## 2024-03-16 MED ORDER — LACTATED RINGERS IV BOLUS
1000.0000 mL | Freq: Once | INTRAVENOUS | Status: AC
  Administered 2024-03-16: 1000 mL via INTRAVENOUS

## 2024-03-16 MED ORDER — DULOXETINE HCL 60 MG PO CPEP
60.0000 mg | ORAL_CAPSULE | Freq: Every day | ORAL | Status: DC
  Administered 2024-03-17 – 2024-03-22 (×6): 60 mg via ORAL
  Filled 2024-03-16 (×2): qty 1
  Filled 2024-03-16: qty 2
  Filled 2024-03-16 (×3): qty 1

## 2024-03-16 MED ORDER — POTASSIUM CHLORIDE 20 MEQ PO PACK
20.0000 meq | PACK | Freq: Once | ORAL | Status: AC
  Administered 2024-03-16: 20 meq via ORAL
  Filled 2024-03-16: qty 1

## 2024-03-16 MED ORDER — POTASSIUM CHLORIDE 10 MEQ/100ML IV SOLN
10.0000 meq | INTRAVENOUS | Status: DC
  Filled 2024-03-16: qty 100

## 2024-03-16 MED ORDER — THIAMINE MONONITRATE 100 MG PO TABS
100.0000 mg | ORAL_TABLET | Freq: Every day | ORAL | Status: DC
  Administered 2024-03-18 – 2024-03-22 (×5): 100 mg via ORAL
  Filled 2024-03-16 (×7): qty 1

## 2024-03-16 MED ORDER — ONDANSETRON 4 MG PO TBDP
8.0000 mg | ORAL_TABLET | Freq: Once | ORAL | Status: AC
  Administered 2024-03-16: 8 mg via ORAL
  Filled 2024-03-16: qty 2

## 2024-03-16 MED ORDER — QUETIAPINE FUMARATE 50 MG PO TABS
100.0000 mg | ORAL_TABLET | Freq: Every day | ORAL | Status: DC
  Administered 2024-03-16 – 2024-03-19 (×4): 100 mg via ORAL
  Filled 2024-03-16 (×4): qty 1

## 2024-03-16 MED ORDER — LORAZEPAM 2 MG/ML IJ SOLN
1.0000 mg | INTRAMUSCULAR | Status: DC | PRN
  Administered 2024-03-16: 3 mg via INTRAVENOUS
  Filled 2024-03-16: qty 2

## 2024-03-16 MED ORDER — LORAZEPAM 1 MG PO TABS
1.0000 mg | ORAL_TABLET | ORAL | Status: DC | PRN
  Administered 2024-03-16: 1 mg via ORAL
  Filled 2024-03-16: qty 1

## 2024-03-16 MED ORDER — FOLIC ACID 1 MG PO TABS
1.0000 mg | ORAL_TABLET | Freq: Every day | ORAL | Status: DC
  Administered 2024-03-16 – 2024-03-22 (×7): 1 mg via ORAL
  Filled 2024-03-16 (×7): qty 1

## 2024-03-16 MED ORDER — SODIUM CHLORIDE 0.9 % IV SOLN: INTRAVENOUS | Status: AC

## 2024-03-16 MED ORDER — LORAZEPAM 2 MG/ML IJ SOLN: 0.0000 mg | Freq: Three times a day (TID) | INTRAMUSCULAR | Status: DC

## 2024-03-16 MED ORDER — LACTATED RINGERS IV SOLN: INTRAVENOUS | Status: DC

## 2024-03-16 MED ORDER — METOCLOPRAMIDE HCL 5 MG/ML IJ SOLN
10.0000 mg | Freq: Once | INTRAMUSCULAR | Status: AC
  Administered 2024-03-16: 10 mg via INTRAVENOUS
  Filled 2024-03-16: qty 2

## 2024-03-16 MED ORDER — POTASSIUM CHLORIDE CRYS ER 20 MEQ PO TBCR: 40.0000 meq | EXTENDED_RELEASE_TABLET | Freq: Every day | ORAL | 0 refills | Status: DC

## 2024-03-16 MED ORDER — LORAZEPAM 2 MG/ML IJ SOLN
1.0000 mg | INTRAMUSCULAR | Status: DC | PRN
  Administered 2024-03-18: 3 mg via INTRAVENOUS
  Administered 2024-03-18 (×3): 4 mg via INTRAVENOUS
  Administered 2024-03-18: 2 mg via INTRAVENOUS
  Filled 2024-03-16: qty 1
  Filled 2024-03-16 (×5): qty 2

## 2024-03-16 MED ORDER — GABAPENTIN 300 MG PO CAPS
600.0000 mg | ORAL_CAPSULE | Freq: Three times a day (TID) | ORAL | Status: DC
  Administered 2024-03-17 – 2024-03-22 (×16): 600 mg via ORAL
  Filled 2024-03-16 (×6): qty 2
  Filled 2024-03-16: qty 6
  Filled 2024-03-16 (×8): qty 2

## 2024-03-16 NOTE — ED Notes (Signed)
 ED Provider at bedside.

## 2024-03-16 NOTE — ED Provider Triage Note (Signed)
 Emergency Medicine Provider Triage Evaluation Note  Xavier Owens , a 53 y.o. male  was evaluated in triage.  Pt complains of chest pain and shortness of breath.  Reports he was recently discharged this morning from the ED.  Patient is vomiting in triage.  Denies abdominal pain, diarrhea.  Denies fevers.  Reports last alcohol intake was 2 days ago.  Review of Systems  Positive:  Negative:   Physical Exam  Ht 5\' 7"  (1.702 m)   Wt 81.6 kg   BMI 28.18 kg/m  Gen:   Awake, no distress   Resp:  Normal effort  MSK:   Moves extremities without difficulty  Other:    Medical Decision Making  Medically screening exam initiated at 3:20 PM.  Appropriate orders placed.  Xavier Owens was informed that the remainder of the evaluation will be completed by another provider, this initial triage assessment does not replace that evaluation, and the importance of remaining in the ED until their evaluation is complete.     Adel Aden, [name] 03/16/24 1526

## 2024-03-16 NOTE — ED Notes (Signed)
 CCMD contacted for cardiac monitoring

## 2024-03-16 NOTE — ED Notes (Signed)
 RN attempted to contact wife for ride home with no success. Voicemail box remains full.

## 2024-03-16 NOTE — H&P (Signed)
 History and Physical    [name]:[id number] DOB: [birth year] DOA: 03/16/2024  Patient coming from: Home.  Chief Complaint: Nausea vomiting.  HPI: Xavier Owens is a 53 y.o. male with history of alcohol abuse, mood disorder, essential tremors, neuropathy who was recently admitted to the hospital last month for alcohol withdrawal was at behavioral health for detox had come to the ER yesterday for alcohol intoxication was found in the neighbors yard.  Patient had nasal abrasion CT scan head was unremarkable patient was observed in the ER and was planned to be discharged home and was waiting for a ride when patient started having intractable nausea vomiting.  Denies any abdominal pain or diarrhea.  Was brought back to the ER.  ED Course: In the ER patient's labs show bicarb of 18 anion gap of 28 WBC of 17.2.  Lactic acid of 8.  LFTs and lipase are pending and CT of the abdomen pelvis also is pending.  Patient was given fluid bolus and admitted for lactic acidosis with intractable nausea vomiting.  Review of Systems: As per HPI, rest all negative.   Past Medical History:  Diagnosis Date   Tremor     History reviewed. No pertinent surgical history.   reports current alcohol use. No history on file for tobacco use and drug use.  No Known Allergies  Family History  Family history unknown: Yes    Prior to Admission medications   Medication Sig Start Date End Date Taking? Authorizing Provider  chlordiazePOXIDE  (LIBRIUM ) 25 MG capsule 50mg  PO TID x 2D, then 25-50mg  PO BID X 2D, then 25-50mg  PO QD X 1D Patient taking differently: Take 25 mg by mouth daily. 50mg  PO TID x 2D, then 25-50mg  PO BID X 2D, then 25-50mg  PO QD X 1D 03/16/24  Yes Mesner, [name], MD  DULoxetine  (CYMBALTA ) 60 MG capsule Take 60 mg by mouth daily. 03/02/24  Yes [provider]  gabapentin  (NEURONTIN ) 300 MG capsule Take 600 mg by mouth 3 (three) times daily. 02/07/24  Yes [provider]   naltrexone  (DEPADE) 50 MG tablet Take 50 mg by mouth daily. 02/03/24  Yes [provider]  potassium chloride  SA (KLOR-CON  M) 20 MEQ tablet Take 2 tablets (40 mEq total) by mouth daily for 7 days. Patient taking differently: Take 20 mEq by mouth daily. 03/16/24 03/23/24 Yes Mesner, [name], MD  propranolol  (INDERAL ) 10 MG tablet Take 10 mg by mouth 2 (two) times daily. 03/02/24  Yes [provider]  QUEtiapine  (SEROQUEL ) 100 MG tablet Take 100 mg by mouth at bedtime. 03/02/24  Yes [provider]  traZODone  (DESYREL ) 100 MG tablet Take 100 mg by mouth at bedtime. 03/02/24  Yes [provider]  XARELTO  10 MG TABS tablet Take 10 mg by mouth daily. 02/03/24  Yes [provider]    Physical Exam: Constitutional: Moderately built and nourished. Vitals:   03/16/24 1800 03/16/24 1800 03/16/24 1830 03/16/24 1900  BP: (!) 150/73 (!) 143/95 (!) 141/88 (!) 163/114  Pulse: 81 81 72 (!) 117  Resp: (!) 23  (!) 26 18  Temp:      SpO2: 98%  100% 100%  Weight:      Height:       Eyes: Anicteric no pallor. ENMT: No discharge from the ears eyes nose or mouth. Neck: No mass felt.  No neck rigidity. Respiratory: No rhonchi or crepitations. Cardiovascular: S1-S2 heard. Abdomen: Soft nontender bowel sound present. Musculoskeletal: No edema. Skin: No rash. Neurologic: Alert awake oriented  to time place and person.  Moves all extremities. Psychiatric: Appears normal.  Normal affect.   Labs on Admission: I have personally reviewed following labs and imaging studies  CBC: Recent Labs  Lab 03/15/24 1729 03/15/24 1735 03/16/24 1519 03/16/24 1739  WBC 9.4  --  17.2*  --   NEUTROABS 7.3  --   --   --   HGB 13.3 15.0 13.9 14.3  HCT 41.4 44.0 43.5 42.0  MCV 89.2  --  89.1  --   PLT 302  --  323  --    Basic Metabolic Panel: Recent Labs  Lab 03/15/24 1729 03/15/24 1735 03/16/24 1519 03/16/24 1739  NA 142 142 141 138  K 3.3* 3.1* 3.4* 3.6  CL 101 102 95*   --   CO2 19*  --  18*  --   GLUCOSE 90 93 104*  --   BUN <5* <3* <5*  --   CREATININE 0.79 1.30* 0.97  --   CALCIUM  8.6*  --  9.6  --    GFR: Estimated Creatinine Clearance: 89 mL/min (by C-G formula based on SCr of 0.97 mg/dL). Liver Function Tests: Recent Labs  Lab 03/15/24 1729  AST 55*  ALT 21  ALKPHOS 92  BILITOT 0.7  PROT 8.0  ALBUMIN 4.0   No results for input(s): "LIPASE", "AMYLASE" in the last 168 hours. Recent Labs  Lab 03/15/24 1747  AMMONIA 36*   Coagulation Profile: No results for input(s): "INR", "PROTIME" in the last 168 hours. Cardiac Enzymes: No results for input(s): "CKTOTAL", "CKMB", "CKMBINDEX", "TROPONINI" in the last 168 hours. BNP (last 3 results) No results for input(s): "PROBNP" in the last 8760 hours. HbA1C: No results for input(s): "HGBA1C" in the last 72 hours. CBG: Recent Labs  Lab 03/15/24 1743  GLUCAP 97   Lipid Profile: No results for input(s): "CHOL", "HDL", "LDLCALC", "TRIG", "CHOLHDL", "LDLDIRECT" in the last 72 hours. Thyroid  Function Tests: No results for input(s): "TSH", "T4TOTAL", "FREET4", "T3FREE", "THYROIDAB" in the last 72 hours. Anemia Panel: No results for input(s): "VITAMINB12", "FOLATE", "FERRITIN", "TIBC", "IRON", "RETICCTPCT" in the last 72 hours. Urine analysis: No results found for: "COLORURINE", "APPEARANCEUR", "LABSPEC", "PHURINE", "GLUCOSEU", "HGBUR", "BILIRUBINUR", "KETONESUR", "PROTEINUR", "UROBILINOGEN", "NITRITE", "LEUKOCYTESUR" Sepsis Labs: @LABRCNTIP (procalcitonin:4,lacticidven:4) )No results found for this or any previous visit (from the past 240 hours).   Radiological Exams on Admission: DG Chest 2 View Result Date: 03/16/2024 CLINICAL DATA:  Shortness of breath and chest pain. EXAM: CHEST - 2 VIEW COMPARISON:  Chest radiograph dated 03/15/2024. FINDINGS: No focal consolidation, pleural effusion or pneumothorax. The cardiac silhouette is within normal limits. No acute osseous pathology. IMPRESSION: No  active cardiopulmonary disease. Electronically Signed   By: Angus Bark M.D.   On: 03/16/2024 17:08   CT HEAD WO CONTRAST Result Date: 03/15/2024 CLINICAL DATA:  Polytrauma, blunt; Facial trauma, blunt EXAM: CT HEAD WITHOUT CONTRAST CT MAXILLOFACIAL WITHOUT CONTRAST CT CERVICAL SPINE WITHOUT CONTRAST TECHNIQUE: Multidetector CT imaging of the head, cervical spine, and maxillofacial structures were performed using the standard protocol without intravenous contrast. Multiplanar CT image reconstructions of the cervical spine and maxillofacial structures were also generated. RADIATION DOSE REDUCTION: This exam was performed according to the departmental dose-optimization program which includes automated exposure control, adjustment of the mA and/or kV according to patient size and/or use of iterative reconstruction technique. COMPARISON:  None Available. FINDINGS: CT HEAD FINDINGS Brain: No evidence of acute infarction, hemorrhage, hydrocephalus, extra-axial collection or mass lesion/mass effect. Vascular: No hyperdense vessel or unexpected calcification. Skull:  No acute fracture. Other: No mastoid effusions. CT MAXILLOFACIAL FINDINGS Osseous: No fracture or mandibular dislocation. No destructive process. Orbits: Negative. No traumatic or inflammatory finding. Sinuses: Moderate right axillary sinus mucosal thickening. Remaining sinuses are clear. Soft tissues: Negative. CT CERVICAL SPINE FINDINGS Alignment: No substantial sagittal subluxation. Skull base and vertebrae: No acute fracture. No primary bone lesion or focal pathologic process. Soft tissues and spinal canal: No prevertebral fluid or swelling. No visible canal hematoma. Disc levels: Multilevel synovial hypertrophy with varying degrees of neural foraminal stenosis, likely greatest on the right at C2-C3. Upper chest: Visualized lung apices are clear. IMPRESSION: 1. No evidence of acute abnormality intracranially or in the cervical spine. 2. No facial  fracture. Electronically Signed   By: Stevenson Elbe M.D.   On: 03/15/2024 22:58   CT CERVICAL SPINE WO CONTRAST Result Date: 03/15/2024 CLINICAL DATA:  Polytrauma, blunt; Facial trauma, blunt EXAM: CT HEAD WITHOUT CONTRAST CT MAXILLOFACIAL WITHOUT CONTRAST CT CERVICAL SPINE WITHOUT CONTRAST TECHNIQUE: Multidetector CT imaging of the head, cervical spine, and maxillofacial structures were performed using the standard protocol without intravenous contrast. Multiplanar CT image reconstructions of the cervical spine and maxillofacial structures were also generated. RADIATION DOSE REDUCTION: This exam was performed according to the departmental dose-optimization program which includes automated exposure control, adjustment of the mA and/or kV according to patient size and/or use of iterative reconstruction technique. COMPARISON:  None Available. FINDINGS: CT HEAD FINDINGS Brain: No evidence of acute infarction, hemorrhage, hydrocephalus, extra-axial collection or mass lesion/mass effect. Vascular: No hyperdense vessel or unexpected calcification. Skull: No acute fracture. Other: No mastoid effusions. CT MAXILLOFACIAL FINDINGS Osseous: No fracture or mandibular dislocation. No destructive process. Orbits: Negative. No traumatic or inflammatory finding. Sinuses: Moderate right axillary sinus mucosal thickening. Remaining sinuses are clear. Soft tissues: Negative. CT CERVICAL SPINE FINDINGS Alignment: No substantial sagittal subluxation. Skull base and vertebrae: No acute fracture. No primary bone lesion or focal pathologic process. Soft tissues and spinal canal: No prevertebral fluid or swelling. No visible canal hematoma. Disc levels: Multilevel synovial hypertrophy with varying degrees of neural foraminal stenosis, likely greatest on the right at C2-C3. Upper chest: Visualized lung apices are clear. IMPRESSION: 1. No evidence of acute abnormality intracranially or in the cervical spine. 2. No facial fracture.  Electronically Signed   By: Stevenson Elbe M.D.   On: 03/15/2024 22:58   CT Maxillofacial Wo Contrast Result Date: 03/15/2024 CLINICAL DATA:  Polytrauma, blunt; Facial trauma, blunt EXAM: CT HEAD WITHOUT CONTRAST CT MAXILLOFACIAL WITHOUT CONTRAST CT CERVICAL SPINE WITHOUT CONTRAST TECHNIQUE: Multidetector CT imaging of the head, cervical spine, and maxillofacial structures were performed using the standard protocol without intravenous contrast. Multiplanar CT image reconstructions of the cervical spine and maxillofacial structures were also generated. RADIATION DOSE REDUCTION: This exam was performed according to the departmental dose-optimization program which includes automated exposure control, adjustment of the mA and/or kV according to patient size and/or use of iterative reconstruction technique. COMPARISON:  None Available. FINDINGS: CT HEAD FINDINGS Brain: No evidence of acute infarction, hemorrhage, hydrocephalus, extra-axial collection or mass lesion/mass effect. Vascular: No hyperdense vessel or unexpected calcification. Skull: No acute fracture. Other: No mastoid effusions. CT MAXILLOFACIAL FINDINGS Osseous: No fracture or mandibular dislocation. No destructive process. Orbits: Negative. No traumatic or inflammatory finding. Sinuses: Moderate right axillary sinus mucosal thickening. Remaining sinuses are clear. Soft tissues: Negative. CT CERVICAL SPINE FINDINGS Alignment: No substantial sagittal subluxation. Skull base and vertebrae: No acute fracture. No primary bone lesion or focal pathologic process. Soft tissues and  spinal canal: No prevertebral fluid or swelling. No visible canal hematoma. Disc levels: Multilevel synovial hypertrophy with varying degrees of neural foraminal stenosis, likely greatest on the right at C2-C3. Upper chest: Visualized lung apices are clear. IMPRESSION: 1. No evidence of acute abnormality intracranially or in the cervical spine. 2. No facial fracture. Electronically  Signed   By: Stevenson Elbe M.D.   On: 03/15/2024 22:58   DG Chest Port 1 View Result Date: 03/15/2024 CLINICAL DATA:  Trauma due to a fall. Patient fell forward abdomen rocking chair with abrasions to the nose. Intoxicated. EXAM: PORTABLE CHEST 1 VIEW COMPARISON:  None Available. FINDINGS: Shallow inspiration. Heart size and pulmonary vascularity are normal for technique. Lungs are clear. No pleural effusion or pneumothorax. Mediastinal contours appear intact. Visualized bones are nondisplaced. IMPRESSION: Shallow inspiration.  No evidence of active pulmonary disease. Electronically Signed   By: Boyce Byes M.D.   On: 03/15/2024 17:43    EKG: Independently reviewed.  Normal sinus rhythm.  Assessment/Plan Principal Problem:   Nausea & vomiting Active Problems:   Lactic acidosis   Alcohol abuse   Tremor   Neuropathy   Mood disorder (HCC)    Intractable nausea vomiting cause not clear.  LFTs lipase CT scan abdomen and pelvis is pending.  Will try to advance diet as tolerated continue with fluids.  Will keep patient on IV Protonix . Lactic acidosis with metabolic acidosis could be from nausea vomiting and starvation ketosis.  Continue with hydration follow metabolic panel. Alcohol abuse last drink was yesterday usually drinks vodka.  On CIWA protocol.  Thiamine .  Advised about quitting. Essential tremors on propranolol . Neuropathy on gabapentin . Mood disorder on Seroquel , Cymbalta  and trazodone .  Since patient has intractable nausea vomiting with lactic acidosis will need close monitoring and more than 2 midnight stay.   DVT prophylaxis: Lovenox . Code Status: Full code. Family Communication: Discussed with patient. Disposition Plan: Progressive care. Consults called: Child psychotherapist. Admission status: Observation.

## 2024-03-16 NOTE — ED Notes (Signed)
 Pt ambulated in hall appropriately without assistance.

## 2024-03-16 NOTE — ED Provider Notes (Signed)
 [place] EMERGENCY DEPARTMENT AT [facility] Provider Note   CSN: [id number] Arrival date & time: 03/16/24  1451     History  Chief Complaint  Patient presents with   Emesis    Xavier Owens is a 53 y.o. male.  Patient is a 53 year old male with a history of chronic alcohol use with DTs in the past who did require hospitalization earlier this year for withdrawal symptoms who was seen in the emergency room last night and discharged this morning around 11 AM who is returning today by EMS for chest pain, shortness of breath and vomiting.  Patient's last drink was yesterday and alcohol was 495 upon his arrival last night.  It was reported that he was found in the yard and it was assumed that he fell out of a rocking chair on the porch.  While in the emergency room patient was agitated and required multiple antipsychotic medication and then got out of his restraints and fell out of bed hitting his face and head on the floor then as well.  Patient did have scans while he was in the emergency room which were negative for any acute pathology.  He was finally discharged at 24 when he was at baseline able to walk without assistance and seems safe for discharge home.  However patient reports he was only home a short time when the symptoms started.  He denies any abdominal pain or diarrhea.  He has been coughing and having emesis but denies any fevers.  Patient reports there is a tightness all the way across the front of his chest.  The history is provided by the patient, medical records and the EMS personnel.  Emesis      Home Medications Prior to Admission medications   Medication Sig Start Date End Date Taking? Authorizing Provider  chlordiazePOXIDE  (LIBRIUM ) 25 MG capsule 50mg  PO TID x 2D, then 25-50mg  PO BID X 2D, then 25-50mg  PO QD X 1D 03/16/24   Mesner, [name], MD  potassium chloride  SA (KLOR-CON  M) 20 MEQ tablet Take 2 tablets (40 mEq total) by mouth daily for 7 days. 03/16/24  03/23/24  Mesner, [name], MD      Allergies    Patient has no known allergies.    Review of Systems   Review of Systems  Gastrointestinal:  Positive for vomiting.    Physical Exam Updated Vital Signs BP (!) 163/114   Pulse (!) 117   Temp 98 F (36.7 C)   Resp 18   Ht 5\' 7"  (1.702 m)   Wt 81.6 kg   SpO2 100%   BMI 28.18 kg/m  Physical Exam Vitals and nursing note reviewed.  Constitutional:      Appearance: He is diaphoretic.  HENT:     Head: Normocephalic.     Comments: Multiple abrasions and contusions over the face including the right cheek, nose and lip    Mouth/Throat:     Mouth: Mucous membranes are dry.  Eyes:     Pupils: Pupils are equal, round, and reactive to light.  Cardiovascular:     Rate and Rhythm: Tachycardia present.     Pulses: Normal pulses.  Pulmonary:     Effort: Pulmonary effort is normal.  Chest:     Chest wall: No tenderness.  Abdominal:     General: Abdomen is flat. There is no distension.     Palpations: Abdomen is soft.     Tenderness: There is no abdominal tenderness.  Musculoskeletal:  Comments: Numerous areas of ecchymosis at various stages of healing over the arms and legs  Skin:    Coloration: Skin is pale.  Neurological:     Mental Status: He is alert and oriented to person, place, and time. Mental status is at baseline.     ED Results / Procedures / Treatments   Labs (all labs ordered are listed, but only abnormal results are displayed) Labs Reviewed  CBC - Abnormal; Notable for the following components:      Result Value   WBC 17.2 (*)    All other components within normal limits  BASIC METABOLIC PANEL WITH GFR - Abnormal; Notable for the following components:   Potassium 3.4 (*)    Chloride 95 (*)    CO2 18 (*)    Glucose, Bld 104 (*)    BUN <5 (*)    Anion gap 28 (*)    All other components within normal limits  I-STAT CG4 LACTIC ACID, ED - Abnormal; Notable for the following components:   Lactic Acid, Venous  8.0 (*)    All other components within normal limits  I-STAT VENOUS BLOOD GAS, ED - Abnormal; Notable for the following components:   pH, Ven 7.493 (*)    pCO2, Ven 33.1 (*)    Acid-Base Excess 3.0 (*)    Calcium , Ion 0.97 (*)    All other components within normal limits  ETHANOL  I-STAT CG4 LACTIC ACID, ED  TROPONIN I (HIGH SENSITIVITY)  TROPONIN I (HIGH SENSITIVITY)    EKG EKG Interpretation Date/Time:  Saturday Mar 16 2024 15:30:01 EDT Ventricular Rate:  90 PR Interval:  150 QRS Duration:  102 QT Interval:  362 QTC Calculation: 442 R Axis:   0  Text Interpretation: Normal sinus rhythm Incomplete right bundle branch block Cannot rule out Inferior infarct , age undetermined Possible Anterior infarct , age undetermined No significant change since last tracing When compared with ECG of 16-Mar-2024 00:31, PREVIOUS ECG IS PRESENT Confirmed by Almond Army ([id number]) on 03/16/2024 4:03:12 PM  Radiology DG Chest 2 View Result Date: 03/16/2024 CLINICAL DATA:  Shortness of breath and chest pain. EXAM: CHEST - 2 VIEW COMPARISON:  Chest radiograph dated 03/15/2024. FINDINGS: No focal consolidation, pleural effusion or pneumothorax. The cardiac silhouette is within normal limits. No acute osseous pathology. IMPRESSION: No active cardiopulmonary disease. Electronically Signed   By: Angus Bark M.D.   On: 03/16/2024 17:08   CT HEAD WO CONTRAST Result Date: 03/15/2024 CLINICAL DATA:  Polytrauma, blunt; Facial trauma, blunt EXAM: CT HEAD WITHOUT CONTRAST CT MAXILLOFACIAL WITHOUT CONTRAST CT CERVICAL SPINE WITHOUT CONTRAST TECHNIQUE: Multidetector CT imaging of the head, cervical spine, and maxillofacial structures were performed using the standard protocol without intravenous contrast. Multiplanar CT image reconstructions of the cervical spine and maxillofacial structures were also generated. RADIATION DOSE REDUCTION: This exam was performed according to the departmental dose-optimization program  which includes automated exposure control, adjustment of the mA and/or kV according to patient size and/or use of iterative reconstruction technique. COMPARISON:  None Available. FINDINGS: CT HEAD FINDINGS Brain: No evidence of acute infarction, hemorrhage, hydrocephalus, extra-axial collection or mass lesion/mass effect. Vascular: No hyperdense vessel or unexpected calcification. Skull: No acute fracture. Other: No mastoid effusions. CT MAXILLOFACIAL FINDINGS Osseous: No fracture or mandibular dislocation. No destructive process. Orbits: Negative. No traumatic or inflammatory finding. Sinuses: Moderate right axillary sinus mucosal thickening. Remaining sinuses are clear. Soft tissues: Negative. CT CERVICAL SPINE FINDINGS Alignment: No substantial sagittal subluxation. Skull base and vertebrae: No  acute fracture. No primary bone lesion or focal pathologic process. Soft tissues and spinal canal: No prevertebral fluid or swelling. No visible canal hematoma. Disc levels: Multilevel synovial hypertrophy with varying degrees of neural foraminal stenosis, likely greatest on the right at C2-C3. Upper chest: Visualized lung apices are clear. IMPRESSION: 1. No evidence of acute abnormality intracranially or in the cervical spine. 2. No facial fracture. Electronically Signed   By: Stevenson Elbe M.D.   On: 03/15/2024 22:58   CT CERVICAL SPINE WO CONTRAST Result Date: 03/15/2024 CLINICAL DATA:  Polytrauma, blunt; Facial trauma, blunt EXAM: CT HEAD WITHOUT CONTRAST CT MAXILLOFACIAL WITHOUT CONTRAST CT CERVICAL SPINE WITHOUT CONTRAST TECHNIQUE: Multidetector CT imaging of the head, cervical spine, and maxillofacial structures were performed using the standard protocol without intravenous contrast. Multiplanar CT image reconstructions of the cervical spine and maxillofacial structures were also generated. RADIATION DOSE REDUCTION: This exam was performed according to the departmental dose-optimization program which includes  automated exposure control, adjustment of the mA and/or kV according to patient size and/or use of iterative reconstruction technique. COMPARISON:  None Available. FINDINGS: CT HEAD FINDINGS Brain: No evidence of acute infarction, hemorrhage, hydrocephalus, extra-axial collection or mass lesion/mass effect. Vascular: No hyperdense vessel or unexpected calcification. Skull: No acute fracture. Other: No mastoid effusions. CT MAXILLOFACIAL FINDINGS Osseous: No fracture or mandibular dislocation. No destructive process. Orbits: Negative. No traumatic or inflammatory finding. Sinuses: Moderate right axillary sinus mucosal thickening. Remaining sinuses are clear. Soft tissues: Negative. CT CERVICAL SPINE FINDINGS Alignment: No substantial sagittal subluxation. Skull base and vertebrae: No acute fracture. No primary bone lesion or focal pathologic process. Soft tissues and spinal canal: No prevertebral fluid or swelling. No visible canal hematoma. Disc levels: Multilevel synovial hypertrophy with varying degrees of neural foraminal stenosis, likely greatest on the right at C2-C3. Upper chest: Visualized lung apices are clear. IMPRESSION: 1. No evidence of acute abnormality intracranially or in the cervical spine. 2. No facial fracture. Electronically Signed   By: Stevenson Elbe M.D.   On: 03/15/2024 22:58   CT Maxillofacial Wo Contrast Result Date: 03/15/2024 CLINICAL DATA:  Polytrauma, blunt; Facial trauma, blunt EXAM: CT HEAD WITHOUT CONTRAST CT MAXILLOFACIAL WITHOUT CONTRAST CT CERVICAL SPINE WITHOUT CONTRAST TECHNIQUE: Multidetector CT imaging of the head, cervical spine, and maxillofacial structures were performed using the standard protocol without intravenous contrast. Multiplanar CT image reconstructions of the cervical spine and maxillofacial structures were also generated. RADIATION DOSE REDUCTION: This exam was performed according to the departmental dose-optimization program which includes automated  exposure control, adjustment of the mA and/or kV according to patient size and/or use of iterative reconstruction technique. COMPARISON:  None Available. FINDINGS: CT HEAD FINDINGS Brain: No evidence of acute infarction, hemorrhage, hydrocephalus, extra-axial collection or mass lesion/mass effect. Vascular: No hyperdense vessel or unexpected calcification. Skull: No acute fracture. Other: No mastoid effusions. CT MAXILLOFACIAL FINDINGS Osseous: No fracture or mandibular dislocation. No destructive process. Orbits: Negative. No traumatic or inflammatory finding. Sinuses: Moderate right axillary sinus mucosal thickening. Remaining sinuses are clear. Soft tissues: Negative. CT CERVICAL SPINE FINDINGS Alignment: No substantial sagittal subluxation. Skull base and vertebrae: No acute fracture. No primary bone lesion or focal pathologic process. Soft tissues and spinal canal: No prevertebral fluid or swelling. No visible canal hematoma. Disc levels: Multilevel synovial hypertrophy with varying degrees of neural foraminal stenosis, likely greatest on the right at C2-C3. Upper chest: Visualized lung apices are clear. IMPRESSION: 1. No evidence of acute abnormality intracranially or in the cervical spine. 2. No facial fracture.  Electronically Signed   By: Stevenson Elbe M.D.   On: 03/15/2024 22:58   DG Chest Port 1 View Result Date: 03/15/2024 CLINICAL DATA:  Trauma due to a fall. Patient fell forward abdomen rocking chair with abrasions to the nose. Intoxicated. EXAM: PORTABLE CHEST 1 VIEW COMPARISON:  None Available. FINDINGS: Shallow inspiration. Heart size and pulmonary vascularity are normal for technique. Lungs are clear. No pleural effusion or pneumothorax. Mediastinal contours appear intact. Visualized bones are nondisplaced. IMPRESSION: Shallow inspiration.  No evidence of active pulmonary disease. Electronically Signed   By: Boyce Byes M.D.   On: 03/15/2024 17:43    Procedures Procedures     Medications Ordered in ED Medications  LORazepam  (ATIVAN ) tablet 1-4 mg (1 mg Oral Given 03/16/24 1916)    Or  LORazepam  (ATIVAN ) injection 1-4 mg ( Intravenous See Alternative 03/16/24 1916)  0.9 %  sodium chloride  infusion (has no administration in time range)  ondansetron  (ZOFRAN -ODT) disintegrating tablet 8 mg (8 mg Oral Given 03/16/24 1558)  lactated ringers  bolus 1,000 mL (0 mLs Intravenous Stopped 03/16/24 1759)  metoCLOPramide  (REGLAN ) injection 10 mg (10 mg Intravenous Given 03/16/24 1629)  lactated ringers  bolus 1,000 mL (0 mLs Intravenous Stopped 03/16/24 1917)    ED Course/ Medical Decision Making/ A&P                                 Medical Decision Making Amount and/or Complexity of Data Reviewed External Data Reviewed: notes. Labs: ordered. Decision-making details documented in ED Course. Radiology: ordered and independent interpretation performed. Decision-making details documented in ED Course. ECG/medicine tests: ordered and independent interpretation performed. Decision-making details documented in ED Course.  Risk Prescription drug management. Decision regarding hospitalization.   Pt with multiple medical problems and comorbidities and presenting today with a complaint that caries a high risk for morbidity and mortality.  Presenting today with chest pain, shortness of breath and vomiting.  Concern for alcohol withdrawal versus effects of heavy alcohol use last night versus pancreatitis, hepatitis, aspiration pneumonia.  Also possibility for ACS.  Low suspicion for PE or dissection.  Patient sats are 97% on room air.  Patient has no abdominal pain at this time to suggest perforation.  He was given IV fluids and antiemetics.  I independently interpreted patient's EKG which shows a sinus tachycardia but no other acute changes.  Labs are pending.  Patient has already received thiamine , folic acid  and a multivitamin today.  7:39 PM I independently interpreted patient's labs  and troponin is normal, lactic acid today is 8 which would explain his anion gap of 8, VBG with normal pH actually little basic with a CO2 of 33 and a normal bicarb.  Alcohol now is 0, CBC with a leukocytosis of 17 and a stable hemoglobin, BMP with normal creatinine and electrolytes.  I have independently visualized and interpreted pt's images today.  Chest x-ray within normal limits.  Patient was started on CIWA due to his increased with of withdrawal with his known alcohol history.  On repeat evaluation after receiving 2 L of fluid and starting on a rate of fluid patient reports feeling a bit better.  He still has no abdominal pain.  Feel that patient requires admission for the above process.  Discussed this with the patient and he is comfortable with this plan.  Spoke with the hospitalist for admission.  CRITICAL CARE Performed by: Katie Moch Total critical care time: 30 minutes Critical  care time was exclusive of separately billable procedures and treating other patients. Critical care was necessary to treat or prevent imminent or life-threatening deterioration. Critical care was time spent personally by me on the following activities: development of treatment plan with patient and/or surrogate as well as nursing, discussions with consultants, evaluation of patient's response to treatment, examination of patient, obtaining history from patient or surrogate, ordering and performing treatments and interventions, ordering and review of laboratory studies, ordering and review of radiographic studies, pulse oximetry and re-evaluation of patient's condition.           Final Clinical Impression(s) / ED Diagnoses Final diagnoses:  Acute lactic acidosis  Dehydration  Alcoholic ketosis (HCC)    Rx / DC Orders ED Discharge Orders     None         Almond Army, MD 03/16/24 1940

## 2024-03-16 NOTE — ED Notes (Addendum)
 RN attempted to contact wife again with no answer. Ambulatory to bathroom without need for assistance. Breakfast tray at bedside with water provided.

## 2024-03-16 NOTE — ED Triage Notes (Signed)
 This pt was just discharged earlier for the same thing he is c/o now  vomiting as he came back to triage in the w/c

## 2024-03-16 NOTE — ED Notes (Signed)
 Attempted to contact wife for ride home, no answer. Voicemail box full, unable to leave return message.

## 2024-03-16 NOTE — ED Provider Notes (Signed)
 1:22 AM Assumed care from Dr. Tamela Fake, please see their note for full history, physical and decision making until this point. In brief this is a 53 y.o. year old male who presented to the ED tonight with Alcohol Intoxication and Fall (/)     Patient initially here for ground level fall, intoxication, decreased responsiveness. Difficulty getting imaging initially secondary to being anxious. Xavier Owens again while here. Got geodon . Got scans, normal. Now becoming more HTN and more tachycardic concerning for possible ETOH W/D as his significant other stated that he has a history of w/d seizures (no records seen in the chart) and is worried the same may happen again as it happens whenever his etoh gets to a certain level. At this point he is sleepy from Geodon . CIWA in place. Thiamine  given. Pending reevaluation for disposition/further treatment.     Labs Reviewed  COMPREHENSIVE METABOLIC PANEL WITH GFR - Abnormal; Notable for the following components:      Result Value   Potassium 3.3 (*)    CO2 19 (*)    BUN <5 (*)    Calcium  8.6 (*)    AST 55 (*)    GFR, Estimated 59 (*)    Anion gap 22 (*)    All other components within normal limits  AMMONIA - Abnormal; Notable for the following components:   Ammonia 36 (*)    All other components within normal limits  ETHANOL - Abnormal; Notable for the following components:   Alcohol, Ethyl (B) 495 (*)    All other components within normal limits  I-STAT CHEM 8, ED - Abnormal; Notable for the following components:   Potassium 3.1 (*)    BUN <3 (*)    Creatinine, Ser 1.30 (*)    Calcium , Ion 0.89 (*)    TCO2 20 (*)    All other components within normal limits  CBC WITH DIFFERENTIAL/PLATELET  RAPID URINE DRUG SCREEN, HOSP PERFORMED  CBG MONITORING, ED    CT HEAD WO CONTRAST  Final Result    CT CERVICAL SPINE WO CONTRAST  Final Result    CT Maxillofacial Wo Contrast  Final Result    DG Chest Port 1 View  Final Result      No follow-ups  on file.  Physical Exam  BP (!) 145/104   Pulse (!) 122   Temp (!) 96.5 F (35.8 C) (Axillary)   Resp 12   Ht 5\' 7"  (1.702 m)   Wt 81.6 kg   SpO2 100%   BMI 28.19 kg/m   Physical Exam HENT:     Head:     Comments: Abrasions to nose and face. Swollen lip.  Cardiovascular:     Rate and Rhythm: Tachycardia present.  Skin:    Comments: Abrasions to bilateral anterior shoulders and chest     Procedures  .Critical Care  Performed by: Eve Hinders, MD Authorized by: Eve Hinders, MD   Critical care provider statement:    Critical care time (minutes):  30   Critical care was necessary to treat or prevent imminent or life-threatening deterioration of the following conditions:  Trauma   Critical care was time spent personally by me on the following activities:  Development of treatment plan with patient or surrogate, discussions with consultants, evaluation of patient's response to treatment, examination of patient, ordering and review of laboratory studies, ordering and review of radiographic studies, ordering and performing treatments and interventions, pulse oximetry, re-evaluation of patient's condition and review of old charts   ED  Course / MDM   Clinical Course as of 03/16/24 0122  Fri Mar 15, 2024  1920 Ammonia(!): 36 [AH]  1920 Alcohol, Ethyl (B)(!!): 495 [AH]  1920 Anion gap(!): 22 Anion gap likely secondary to alcoholic ketosis [AH]  2003 I got a call from patient's partner Xavier Owens states that she is terrified the patient is going to die because he has been "out of his mind drunk for the past 3 days."  She has reports a history of severe dependence and seizures.  She states that he needs to be watched very thoroughly because he will go into withdrawal as soon as his alcohol levels come down a bit.  Patient currently is a bit more sober he is removed his upper extremity restraints currently not fighting anyone however is trying to get out of bed we will give him some  Ativan  at this time continue to monitor him we will consider removing the restraints on his ankle shortly.  He is also on a Posey belt for safety. [AH]  2034 Patient given 2 mg of IV Ativan  he is more agitated now.  Will calculate his score and give some Haldol  so we can complete our trauma assessment. [AH]  2045 I was called to the patient's room because he got out of his foot restraint fell out of the bed and face planted onto the floor.  He now busted his lip.  I have added Haldol  and he is placed back on 4 point restraints.  Patient obviously still needs traumatic scans and has not gotten them yet so we will hopefully get his CT maxillofacial head and neck done shortly.  CIWA score is now day 2:05 of Ativan .  Patient is clearly unsafe not to be in restraints as he is still very intoxicated and a danger to himself [AH]    Clinical Course User Index [AH] Tama Fails, [name]   Medical Decision Making Amount and/or Complexity of Data Reviewed Labs: ordered. Decision-making details documented in ED Course. Radiology: ordered.  Risk OTC drugs. Prescription drug management.   Patient HR significantly continued to improve with IVF to the point it was 90 on most recent recheck. He is awake, alert and states he's sore but feels ok otherwise. States he's not sure how much he drank yesterday but it was quite a lot. I told him his ETOH level and he stated that he had been above 500 previously. He is planning on quitting. I offered librium  to help with the symptoms but patient refused and said he had some at home. He drank some water. Plan to ambulate him and if he ambulates ok can be discharged.   Ambulated well. Sore all over from falls. Reviewed images done last night and are reassuring. Stable for d/c if he cna get a ride home.   Labs, studies and imaging reviewed by myself and considered in medical decision making if ordered. Imaging interpreted by radiology.   [name], [name], MD 03/16/24  [phone]

## 2024-03-16 NOTE — ED Notes (Signed)
 RN attempted to contact wife again at this time with no answer. Voicemail box still remains full.

## 2024-03-16 NOTE — Care Management (Signed)
 Staff unable to get a hold of spouse voicemail full texted wife., and will call father if no response

## 2024-03-17 DIAGNOSIS — D649 Anemia, unspecified: Secondary | ICD-10-CM | POA: Diagnosis present

## 2024-03-17 DIAGNOSIS — F39 Unspecified mood [affective] disorder: Secondary | ICD-10-CM | POA: Diagnosis not present

## 2024-03-17 DIAGNOSIS — E8721 Acute metabolic acidosis: Secondary | ICD-10-CM | POA: Diagnosis present

## 2024-03-17 DIAGNOSIS — S0083XA Contusion of other part of head, initial encounter: Secondary | ICD-10-CM | POA: Diagnosis present

## 2024-03-17 DIAGNOSIS — F329 Major depressive disorder, single episode, unspecified: Secondary | ICD-10-CM | POA: Diagnosis not present

## 2024-03-17 DIAGNOSIS — Y908 Blood alcohol level of 240 mg/100 ml or more: Secondary | ICD-10-CM | POA: Diagnosis present

## 2024-03-17 DIAGNOSIS — F10231 Alcohol dependence with withdrawal delirium: Secondary | ICD-10-CM | POA: Diagnosis not present

## 2024-03-17 DIAGNOSIS — E860 Dehydration: Secondary | ICD-10-CM | POA: Diagnosis present

## 2024-03-17 DIAGNOSIS — Z716 Tobacco abuse counseling: Secondary | ICD-10-CM | POA: Diagnosis not present

## 2024-03-17 DIAGNOSIS — E876 Hypokalemia: Secondary | ICD-10-CM

## 2024-03-17 DIAGNOSIS — F32A Depression, unspecified: Secondary | ICD-10-CM | POA: Diagnosis not present

## 2024-03-17 DIAGNOSIS — E872 Acidosis, unspecified: Secondary | ICD-10-CM | POA: Diagnosis not present

## 2024-03-17 DIAGNOSIS — F10939 Alcohol use, unspecified with withdrawal, unspecified: Secondary | ICD-10-CM

## 2024-03-17 DIAGNOSIS — G629 Polyneuropathy, unspecified: Secondary | ICD-10-CM | POA: Diagnosis present

## 2024-03-17 DIAGNOSIS — Z7901 Long term (current) use of anticoagulants: Secondary | ICD-10-CM | POA: Diagnosis not present

## 2024-03-17 DIAGNOSIS — G250 Essential tremor: Secondary | ICD-10-CM | POA: Diagnosis present

## 2024-03-17 DIAGNOSIS — F331 Major depressive disorder, recurrent, moderate: Secondary | ICD-10-CM | POA: Diagnosis not present

## 2024-03-17 DIAGNOSIS — R112 Nausea with vomiting, unspecified: Secondary | ICD-10-CM | POA: Diagnosis not present

## 2024-03-17 DIAGNOSIS — F332 Major depressive disorder, recurrent severe without psychotic features: Secondary | ICD-10-CM | POA: Diagnosis present

## 2024-03-17 DIAGNOSIS — K2970 Gastritis, unspecified, without bleeding: Secondary | ICD-10-CM | POA: Diagnosis present

## 2024-03-17 DIAGNOSIS — Z5900 Homelessness unspecified: Secondary | ICD-10-CM | POA: Diagnosis not present

## 2024-03-17 DIAGNOSIS — F10229 Alcohol dependence with intoxication, unspecified: Secondary | ICD-10-CM | POA: Diagnosis present

## 2024-03-17 DIAGNOSIS — F17210 Nicotine dependence, cigarettes, uncomplicated: Secondary | ICD-10-CM | POA: Diagnosis present

## 2024-03-17 DIAGNOSIS — K760 Fatty (change of) liver, not elsewhere classified: Secondary | ICD-10-CM | POA: Diagnosis present

## 2024-03-17 DIAGNOSIS — W06XXXA Fall from bed, initial encounter: Secondary | ICD-10-CM | POA: Diagnosis present

## 2024-03-17 DIAGNOSIS — F1090 Alcohol use, unspecified, uncomplicated: Secondary | ICD-10-CM | POA: Diagnosis not present

## 2024-03-17 DIAGNOSIS — Z560 Unemployment, unspecified: Secondary | ICD-10-CM | POA: Diagnosis not present

## 2024-03-17 DIAGNOSIS — F10931 Alcohol use, unspecified with withdrawal delirium: Secondary | ICD-10-CM | POA: Diagnosis not present

## 2024-03-17 DIAGNOSIS — F22 Delusional disorders: Secondary | ICD-10-CM | POA: Diagnosis present

## 2024-03-17 DIAGNOSIS — E878 Other disorders of electrolyte and fluid balance, not elsewhere classified: Secondary | ICD-10-CM | POA: Diagnosis not present

## 2024-03-17 DIAGNOSIS — R000 Tachycardia, unspecified: Secondary | ICD-10-CM | POA: Diagnosis not present

## 2024-03-17 DIAGNOSIS — J690 Pneumonitis due to inhalation of food and vomit: Secondary | ICD-10-CM | POA: Diagnosis present

## 2024-03-17 DIAGNOSIS — Z781 Physical restraint status: Secondary | ICD-10-CM | POA: Diagnosis not present

## 2024-03-17 DIAGNOSIS — J189 Pneumonia, unspecified organism: Secondary | ICD-10-CM | POA: Diagnosis not present

## 2024-03-17 DIAGNOSIS — Z79899 Other long term (current) drug therapy: Secondary | ICD-10-CM | POA: Diagnosis not present

## 2024-03-17 LAB — MAGNESIUM: Magnesium: 1.3 mg/dL — ABNORMAL LOW (ref 1.7–2.4)

## 2024-03-17 LAB — BASIC METABOLIC PANEL WITH GFR
Anion gap: 12 (ref 5–15)
BUN: <5 mg/dL — ABNORMAL LOW (ref 6–20)
CO2: 27 mmol/L (ref 22–32)
Calcium: 8.2 mg/dL — ABNORMAL LOW (ref 8.9–10.3)
Chloride: 97 mmol/L — ABNORMAL LOW (ref 98–111)
Creatinine, Ser: 0.72 mg/dL (ref 0.61–1.24)
GFR, Estimated: >60 mL/min (ref >60)
Glucose, Bld: 86 mg/dL (ref 70–99)
Potassium: 3.2 mmol/L — ABNORMAL LOW (ref 3.5–5.1)
Sodium: 136 mmol/L (ref 135–145)

## 2024-03-17 LAB — HEPATIC FUNCTION PANEL
ALT: 19 U/L (ref 0–44)
ALT: 20 U/L (ref 0–44)
AST: 41 U/L (ref 15–41)
AST: 50 U/L — ABNORMAL HIGH (ref 15–41)
Albumin: 3 g/dL — ABNORMAL LOW (ref 3.5–5.0)
Albumin: 3.2 g/dL — ABNORMAL LOW (ref 3.5–5.0)
Alkaline Phosphatase: 74 U/L (ref 38–126)
Alkaline Phosphatase: 83 U/L (ref 38–126)
Bilirubin, Direct: 0.3 mg/dL — ABNORMAL HIGH (ref 0.0–0.2)
Bilirubin, Direct: 0.4 mg/dL — ABNORMAL HIGH (ref 0.0–0.2)
Indirect Bilirubin: 1 mg/dL — ABNORMAL HIGH (ref 0.3–0.9)
Indirect Bilirubin: 1.1 mg/dL — ABNORMAL HIGH (ref 0.3–0.9)
Total Bilirubin: 1.4 mg/dL — ABNORMAL HIGH (ref 0.0–1.2)
Total Bilirubin: 1.4 mg/dL — ABNORMAL HIGH (ref 0.0–1.2)
Total Protein: 6.2 g/dL — ABNORMAL LOW (ref 6.5–8.1)
Total Protein: 6.6 g/dL (ref 6.5–8.1)

## 2024-03-17 LAB — CBC WITH DIFFERENTIAL/PLATELET
Abs Immature Granulocytes: 0.03 10*3/uL (ref 0.00–0.07)
Basophils Absolute: 0 10*3/uL (ref 0.0–0.1)
Basophils Relative: 0 %
Eosinophils Absolute: 0 10*3/uL (ref 0.0–0.5)
Eosinophils Relative: 0 %
HCT: 35.3 % — ABNORMAL LOW (ref 39.0–52.0)
Hemoglobin: 11.6 g/dL — ABNORMAL LOW (ref 13.0–17.0)
Immature Granulocytes: 0 %
Lymphocytes Relative: 14 %
Lymphs Abs: 1.1 10*3/uL (ref 0.7–4.0)
MCH: 29 pg (ref 26.0–34.0)
MCHC: 32.9 g/dL (ref 30.0–36.0)
MCV: 88.3 fL (ref 80.0–100.0)
Monocytes Absolute: 0.6 10*3/uL (ref 0.1–1.0)
Monocytes Relative: 8 %
Neutro Abs: 6.2 10*3/uL (ref 1.7–7.7)
Neutrophils Relative %: 78 %
Platelets: 222 10*3/uL (ref 150–400)
RBC: 4 MIL/uL — ABNORMAL LOW (ref 4.22–5.81)
RDW: 13.7 % (ref 11.5–15.5)
WBC: 7.9 10*3/uL (ref 4.0–10.5)
nRBC: 0 % (ref 0.0–0.2)

## 2024-03-17 LAB — LACTIC ACID, PLASMA
Lactic Acid, Venous: 0.7 mmol/L (ref 0.5–1.9)
Lactic Acid, Venous: 1 mmol/L (ref 0.5–1.9)

## 2024-03-17 LAB — CBG MONITORING, ED: Glucose-Capillary: 73 mg/dL (ref 70–99)

## 2024-03-17 LAB — LIPASE, BLOOD: Lipase: 33 U/L (ref 11–51)

## 2024-03-17 LAB — CREATININE, SERUM
Creatinine, Ser: 0.76 mg/dL (ref 0.61–1.24)
GFR, Estimated: >60 mL/min (ref >60)

## 2024-03-17 LAB — HIV ANTIBODY (ROUTINE TESTING W REFLEX): HIV Screen 4th Generation wRfx: NONREACTIVE

## 2024-03-17 MED ORDER — SODIUM CHLORIDE 0.9 % IV SOLN
3.0000 g | Freq: Four times a day (QID) | INTRAVENOUS | Status: AC
Start: 2024-03-17 — End: 2024-03-22
  Administered 2024-03-17 – 2024-03-21 (×19): 3 g via INTRAVENOUS
  Filled 2024-03-17 (×23): qty 8

## 2024-03-17 MED ORDER — POTASSIUM CHLORIDE CRYS ER 20 MEQ PO TBCR
40.0000 meq | EXTENDED_RELEASE_TABLET | Freq: Once | ORAL | Status: AC
  Administered 2024-03-17: 40 meq via ORAL
  Filled 2024-03-17: qty 2

## 2024-03-17 MED ORDER — POTASSIUM CHLORIDE 20 MEQ PO PACK
20.0000 meq | PACK | Freq: Once | ORAL | Status: AC
  Administered 2024-03-17: 20 meq via ORAL
  Filled 2024-03-17: qty 1

## 2024-03-17 MED ORDER — ONDANSETRON HCL 4 MG/2ML IJ SOLN: 4.0000 mg | Freq: Four times a day (QID) | INTRAMUSCULAR | Status: DC | PRN

## 2024-03-17 MED ORDER — ACETAMINOPHEN 325 MG PO TABS
650.0000 mg | ORAL_TABLET | Freq: Four times a day (QID) | ORAL | Status: DC | PRN
  Administered 2024-03-17 – 2024-03-20 (×2): 650 mg via ORAL
  Filled 2024-03-17 (×2): qty 2

## 2024-03-17 MED ORDER — PANTOPRAZOLE SODIUM 40 MG PO TBEC
40.0000 mg | DELAYED_RELEASE_TABLET | Freq: Every day | ORAL | Status: DC
  Administered 2024-03-17 – 2024-03-22 (×6): 40 mg via ORAL
  Filled 2024-03-17 (×6): qty 1

## 2024-03-17 NOTE — Discharge Instructions (Addendum)
 You were hospitalized for pneumonia and alcohol withdrawal. Thank you for allowing us  to be part of your care.   We arranged for you to follow up at:   - I have messaged our clinic to make an appointment for you to visit us .  If you do not hear back from us  please call the clinic at [phone]. - [facility] with Dr. Hubert Madden MD on 05/02/2024 at 10:45 AM.  Please arrive by 10:30 AM. - Please call your psychiatrist or refer to the list below for a provider to establish care with for your mental health needs.    Please note these changes made to your medications:   TAKE these medications  diclofenac  Sodium 1 % Gel, apply up to 4 times a day to your knee and ribs DULoxetine  60 MG capsule take 1 capsule daily. ferrous sulfate  325 (65 FE) MG tablet take 1 pill daily or every other day. folic acid  1 MG tablet daily. hydrOXYzine  25 MG tablet up to every 6 hours as needed for anxiety. Multivitamin Adult Tabs take 1-2 daily. pantoprazole  40 MG tablet take 1 daily. propranolol  10 MG tablet take 2 times daily. QUEtiapine  100 MG tablet take 1 by mouth at bedtime. senna-docusate 8.6-50 MG tablet take 2 times daily. thiamine  100 MG tablet take daily. traZODone  100 MG tablet take daily at bedtime. Naltrexone  50 mg daily Gabapentin  300 mg - take 2 pills (600 mg) up to 3 times daily.   Please call our clinic if you have any questions or concerns, we may be able to help and keep you from a long and expensive emergency room wait. Our clinic and after hours phone number is [phone], the best time to call is Monday through Friday 9 am to 4 pm but there is always someone available 24/7 if you have an emergency. If you need medication refills please notify your pharmacy one week in advance and they will send us  a request.     ------------------------------------                                       Outpatient Substance Abuse  Treatment- uninsured  Narcotics  Anonymous 24-HOUR HELPLINE Pre-recorded for Meeting Schedules [facility] [phone]  [email or web address] ALCOHOLICS ANONYMOUS  [place]   Answering Service [phone] Please Note: All [place] Meetings are Non-smoking [email or web address].es  [facility] -  Insurance: Medicaid /State funding/private insurance Methadone, suboxone/Intensive outpatient  [place]   [phone] Fax: [phone] [address] [place], [place], [address] [place] [phone] Fax: [phone]    [address] [place], [place], [address] ([address] [place], [place], [place], [place], [place], [place], [place], [place]) [facility] [email or web address] Accepts State funding/Medicaid Transitional housing, Intensive Outpatient Treatment, Outpatient treatment, Veterans Services  Phone: [phone] Fax: [phone] Address: [address] [place] [address]   [facility] [employer] ([email or web address].[email or web address]/) Insurance: Medicaid Case Management, Administrator, arts, Medication Management, Outpatient Therapy, Psychosocial Rehabilitation, Substance Abuse Intensive Outpatient  Phone: [phone] Fax: [phone] [address], [place], [place], [address]  [facility] Medicaid, most private insurance providers Types of Program: Individual/Group Therapy, Substance Abuse Treatment  Phone: [place] [phone] Fax: [phone] [address], [place], [place], [address] [place] [phone] [address], [address] [place], [place], [address]  [facility]  Medicaid Types  of Program: SAIOP  Phone: [phone] Fax: [phone] [address] [place], [place], [place], [address] [facility] Medicaid/state funds Crisis line [phone] [place] [address] [phone] [place] [phone] [address] [phone]  [facility] [address];  [place], [place] [address]  [phone]  Substance Abuse Intensive Outpatient Program [facility] [address] [place], [place] [address] [phone]- Substance abuse treatment  Successful Transitions  Insurance: [facility], [address], sliding scale Types of Program: substance abuse treatment, transportation assistance Phone: [phone] Fax: [phone] Address: [address], [place] [address] [facility] ([employer]) Insurance: UHC, [place], [place], [place] Program: addiction counseling, detoxification,  Phone: [phone]  Fax: [phone] Address: [address], [place] [address]  [facility] (statewide facilities/programs) [address] (Medicaid/state funds) [place], [place] [address]                      http://barrett.com/ 8257521034 Jayson Michael- [phone] [place]- [phone] [facility] [place] (2 Locations) (Medicaid/state funds) --[address]  walk in 8:30-12 and 1-2:30 [place], [id number]   [name]- [phone] --[address] [place], [place] [address]  [phone] walk in 8:30-12 and 2-3:30  [facility] state funds/medicaid [address] [place], [place] [address] [phone] [facility] (Suboxone clinic) Medicaid/state funds  [address]  [place], [place] [address] [phone]   [facility]  [address], [place], [place] [address]  [phone] (24 hours) [place]- [address] [place], [place] [address]  [phone] (24 hours) [place]- [address] Alene Husk [phone] [place]- [address] Georgeana Kindler [phone] Carry Clapper [address] [place] [phone] [facility]- Medicaid and state funds  [place]- [address] [place], [place] [address] [phone] (24 hours) [place] [address] [place], [place] [address] [phone] [facility]- [address] [place], [place] [address] [phone] (24  hours) [place] [address] [place], [place]  [address] [phone] [place]- [address] [place] [phone]

## 2024-03-17 NOTE — Progress Notes (Signed)
 PROGRESS NOTE   Xavier Owens  ZOX:[id number]    DOB: [birth year]    DOA: 03/16/2024  PCP: Pcp, No   I have briefly reviewed patients previous medical records in [facility].   Brief Hospital Course:  53 year old male, lives alone, independent, reports that he works in [employer], medical history significant for alcohol use disorder, alcohol withdrawal seizures, delirium tremens, essential tremor, mood disorder, neuropathy, H&P notes recent hospitalization last month for alcohol withdrawal, was at behavioral health for detox (however, able to find this on chart review or Care Everywhere), seen in ED on 5/2 and discharged on 5/3 when he had presented for alcohol intoxication, fall, had required meds such as Geodon , restraints, after discharging home, returned to ED a few hours later by EMS with complaints of chest pain, shortness of breath, vomiting.  He reportedly was found in his yard.  He was admitted for intractable nausea and vomiting, lactic acidosis and alcohol withdrawal.   Assessment & Plan:   Intractable nausea and vomiting: May be alcohol related gastritis/esophagitis. CT A/P: Shows fatty infiltration of the liver but no acute findings to explain his symptoms. Lipase normal/33.  HS Troponin negative. Tolerated clear liquid diet yesterday without any further nausea or vomiting since yesterday. Advance to soft diet and follow.  PPI.  As needed antiemetics.  QTc normal.  Lactic acidosis Presented with serum lactate of 8.   Likely due to alcohol abuse, poor perfusion due to dehydration from intractable nausea and vomiting Aggressively hydrated with IV fluids and this has resolved  Alcohol use disorder, severe, with alcohol withdrawal Blood alcohol level on 5/2 was 495, when he returned on 5/3 it was <15 History of delirium tremens and alcohol withdrawal seizures CIWA scores 9 earlier today Continue CIWA protocol, including thiamine , folate and multivitamins. Monitor  closely.  Seizure precautions  Suspected aspiration pneumonia CT C/A/P: Patchy groundglass opacities in the left lower lobe worrisome for infectious/inflammatory process Continue empirically started IV Unasyn  for now and could consider discharging on Augmentin. Suspected due to nausea and vomiting and earlier AMS  Hypokalemia Replace and follow. To follow and replace magnesium .  Anemia Follow CBC.  At risk for pancytopenia  S/p recent fall Recent CT head x 2, CT cervical spine and maxillofacial and chest x-ray without acute findings.  Left femoral head?  Avascular necrosis Noted on CT Outpatient follow-up..  Tobacco abuse Cessation counseled.  Essential tremors on propranolol . Neuropathy on gabapentin . Mood disorder on Seroquel , Cymbalta  and trazodone .  Body mass index is 28.18 kg/m.   DVT prophylaxis: enoxaparin  (LOVENOX ) injection 40 mg Start: 03/16/24 2200     Code Status: Full Code:  Family Communication:  Disposition:  Status is: Observation The patient will require care spanning > 2 midnights and should be moved to inpatient because: Ongoing alcohol withdrawal, IV fluid, IV antibiotics     Consultants:     Procedures:     Subjective:  Seen this morning while still in ED.  Reports tolerating Gatorade yesterday, has not eaten or drank since then.  Denies any further nausea or vomiting.  Reports pain in the right lower anterior rib cage and feels that he has broken a rib.  Indicates that he drinks 1/5 of vodka maybe 4 times per week (suspect this is being underreported).  Smokes a pack of cigarette every 4 days.  Objective:   Vitals:   03/17/24 0445 03/17/24 0500 03/17/24 0515 03/17/24 0730  BP: 125/83 117/80 110/66 122/67  Pulse: 69 72 69 80  Resp:  14 14 12 15   Temp:    97.9 F (36.6 C)  TempSrc:    Oral  SpO2: 96% 96% 96% 96%  Weight:      Height:        General exam: Young male, moderately built and nourished lying comfortably propped up in bed  without distress.  Oral mucosa with borderline hydration HEENT: Dried blood on his nasal bridge, lower lip Respiratory system: Clear to auscultation. Respiratory effort normal. Cardiovascular system: S1 & S2 heard, RRR. No JVD, murmurs, rubs, gallops or clicks. No pedal edema. Gastrointestinal system: Abdomen is nondistended, soft and nontender. No organomegaly or masses felt. Normal bowel sounds heard. Central nervous system: Alert and oriented. No focal neurological deficits. Extremities: Symmetric 5 x 5 power.  Abrasions on his left knee area. Skin: No rashes, lesions or ulcers Psychiatry: Judgement and insight appear normal. Mood & affect appropriate.  Did not appear anxious or tremulous at this time.    Data Reviewed:   I have personally reviewed following labs and imaging studies   CBC: Recent Labs  Lab 03/15/24 1729 03/15/24 1735 03/16/24 1519 03/16/24 1739 03/16/24 2332 03/17/24 0403  WBC 9.4  --  17.2*  --  11.8* 7.9  NEUTROABS 7.3  --   --   --   --  6.2  HGB 13.3   < > 13.9 14.3 12.1* 11.6*  HCT 41.4   < > 43.5 42.0 37.1* 35.3*  MCV 89.2  --  89.1  --  88.3 88.3  PLT 302  --  323  --  229 222   < > = values in this interval not displayed.    Basic Metabolic Panel: Recent Labs  Lab 03/15/24 1729 03/15/24 1735 03/16/24 1519 03/16/24 1739 03/16/24 2332 03/17/24 0403  NA 142 142 141 138  --  136  K 3.3* 3.1* 3.4* 3.6  --  3.2*  CL 101 102 95*  --   --  97*  CO2 19*  --  18*  --   --  27  GLUCOSE 90 93 104*  --   --  86  BUN <5* <3* <5*  --   --  <5*  CREATININE 0.79 1.30* 0.97  --  0.76 0.72  CALCIUM  8.6*  --  9.6  --   --  8.2*    Liver Function Tests: Recent Labs  Lab 03/15/24 1729 03/16/24 2332 03/17/24 0403  AST 55* 50* 41  ALT 21 20 19   ALKPHOS 92 83 74  BILITOT 0.7 1.4* 1.4*  PROT 8.0 6.6 6.2*  ALBUMIN 4.0 3.2* 3.0*    CBG: Recent Labs  Lab 03/15/24 1743  GLUCAP 97    Microbiology Studies:  No results found for this or any  previous visit (from the past 240 hours).  Radiology Studies:  CT CHEST ABDOMEN PELVIS W CONTRAST Result Date: 03/16/2024 CLINICAL DATA:  Bowel obstruction suspected.  Chest wall pain. EXAM: CT CHEST, ABDOMEN, AND PELVIS WITH CONTRAST TECHNIQUE: Multidetector CT imaging of the chest, abdomen and pelvis was performed following the standard protocol during bolus administration of intravenous contrast. RADIATION DOSE REDUCTION: This exam was performed according to the departmental dose-optimization program which includes automated exposure control, adjustment of the mA and/or kV according to patient size and/or use of iterative reconstruction technique. CONTRAST:  75mL OMNIPAQUE  IOHEXOL  350 MG/ML SOLN COMPARISON:  CT angiogram chest 05/03/2021. FINDINGS: CT CHEST FINDINGS Cardiovascular: Heart is borderline enlarged. There is no pericardial effusion. Aorta is normal in size. Mediastinum/Nodes:  No enlarged mediastinal, hilar, or axillary lymph nodes. Thyroid  gland, trachea, and esophagus demonstrate no significant findings. Lungs/Pleura: There are minimal atelectatic changes in the bilateral lower lobes. There is some patchy ground-glass opacities in the left lower lobe is well. The lungs are otherwise clear. There is no pleural effusion or pneumothorax. There is a stable 2 mm nodule in the right middle lobe image 5/68 compatible with benign etiology. Musculoskeletal: No acute osseous abnormality. There are mild chronic compression deformities of the superior endplate of T5 and T6 similar to prior. CT ABDOMEN PELVIS FINDINGS Hepatobiliary: There is diffuse fatty infiltration of the liver. The gallbladder and bile ducts are within normal limits. Pancreas: Unremarkable. No pancreatic ductal dilatation or surrounding inflammatory changes. Spleen: Normal in size without focal abnormality. Adrenals/Urinary Tract: Adrenal glands are unremarkable. Kidneys are normal, without renal calculi, focal lesion, or hydronephrosis.  Bladder is unremarkable. Stomach/Bowel: Stomach is within normal limits. Appendix appears normal. No evidence of bowel wall thickening, distention, or inflammatory changes. There is sigmoid and descending colon diverticulosis. Vascular/Lymphatic: No significant vascular findings are present. No enlarged abdominal or pelvic lymph nodes. Reproductive: Prostate is unremarkable. Other: No abdominal wall hernia or abnormality. No abdominopelvic ascites. Musculoskeletal: No acute fracture or dislocation identified. There are mixed sclerotic and lucent bubbly appearance identified in the anterior left femoral head. No collapse of the cortex identified. IMPRESSION: 1. Patchy ground-glass opacities in the left lower lobe worrisome for infectious/inflammatory process. 2. Fatty infiltration of the liver. 3. Colonic diverticulosis. 4. Mixed sclerotic and lucent bubbly appearance in the anterior left femoral head worrisome for avascular necrosis. No collapse of the cortex identified. Electronically Signed   By: Tyron Gallon M.D.   On: 03/16/2024 22:11   CT HEAD WO CONTRAST ( ) Result Date: 03/16/2024 CLINICAL DATA:  Mental status change, unknown cause EXAM: CT HEAD WITHOUT CONTRAST TECHNIQUE: Contiguous axial images were obtained from the base of the skull through the vertex without intravenous contrast. RADIATION DOSE REDUCTION: This exam was performed according to the departmental dose-optimization program which includes automated exposure control, adjustment of the mA and/or kV according to patient size and/or use of iterative reconstruction technique. COMPARISON:  None Available. FINDINGS: Brain: No evidence of acute infarction, hemorrhage, hydrocephalus, extra-axial collection or mass lesion/mass effect. Vascular: No hyperdense vessel. Skull: No acute fracture. Sinuses/Orbits: Mild paranasal sinus mucosal thickening. No acute orbital findings. Other: No mastoid effusions. IMPRESSION: No evidence of acute intracranial  abnormality. Electronically Signed   By: Stevenson Elbe M.D.   On: 03/16/2024 22:01   DG Chest 2 View Result Date: 03/16/2024 CLINICAL DATA:  Shortness of breath and chest pain. EXAM: CHEST - 2 VIEW COMPARISON:  Chest radiograph dated 03/15/2024. FINDINGS: No focal consolidation, pleural effusion or pneumothorax. The cardiac silhouette is within normal limits. No acute osseous pathology. IMPRESSION: No active cardiopulmonary disease. Electronically Signed   By: Angus Bark M.D.   On: 03/16/2024 17:08   CT HEAD WO CONTRAST Result Date: 03/15/2024 CLINICAL DATA:  Polytrauma, blunt; Facial trauma, blunt EXAM: CT HEAD WITHOUT CONTRAST CT MAXILLOFACIAL WITHOUT CONTRAST CT CERVICAL SPINE WITHOUT CONTRAST TECHNIQUE: Multidetector CT imaging of the head, cervical spine, and maxillofacial structures were performed using the standard protocol without intravenous contrast. Multiplanar CT image reconstructions of the cervical spine and maxillofacial structures were also generated. RADIATION DOSE REDUCTION: This exam was performed according to the departmental dose-optimization program which includes automated exposure control, adjustment of the mA and/or kV according to patient size and/or use of iterative reconstruction technique. COMPARISON:  None Available. FINDINGS: CT HEAD FINDINGS Brain: No evidence of acute infarction, hemorrhage, hydrocephalus, extra-axial collection or mass lesion/mass effect. Vascular: No hyperdense vessel or unexpected calcification. Skull: No acute fracture. Other: No mastoid effusions. CT MAXILLOFACIAL FINDINGS Osseous: No fracture or mandibular dislocation. No destructive process. Orbits: Negative. No traumatic or inflammatory finding. Sinuses: Moderate right axillary sinus mucosal thickening. Remaining sinuses are clear. Soft tissues: Negative. CT CERVICAL SPINE FINDINGS Alignment: No substantial sagittal subluxation. Skull base and vertebrae: No acute fracture. No primary bone lesion  or focal pathologic process. Soft tissues and spinal canal: No prevertebral fluid or swelling. No visible canal hematoma. Disc levels: Multilevel synovial hypertrophy with varying degrees of neural foraminal stenosis, likely greatest on the right at C2-C3. Upper chest: Visualized lung apices are clear. IMPRESSION: 1. No evidence of acute abnormality intracranially or in the cervical spine. 2. No facial fracture. Electronically Signed   By: Stevenson Elbe M.D.   On: 03/15/2024 22:58   CT CERVICAL SPINE WO CONTRAST Result Date: 03/15/2024 CLINICAL DATA:  Polytrauma, blunt; Facial trauma, blunt EXAM: CT HEAD WITHOUT CONTRAST CT MAXILLOFACIAL WITHOUT CONTRAST CT CERVICAL SPINE WITHOUT CONTRAST TECHNIQUE: Multidetector CT imaging of the head, cervical spine, and maxillofacial structures were performed using the standard protocol without intravenous contrast. Multiplanar CT image reconstructions of the cervical spine and maxillofacial structures were also generated. RADIATION DOSE REDUCTION: This exam was performed according to the departmental dose-optimization program which includes automated exposure control, adjustment of the mA and/or kV according to patient size and/or use of iterative reconstruction technique. COMPARISON:  None Available. FINDINGS: CT HEAD FINDINGS Brain: No evidence of acute infarction, hemorrhage, hydrocephalus, extra-axial collection or mass lesion/mass effect. Vascular: No hyperdense vessel or unexpected calcification. Skull: No acute fracture. Other: No mastoid effusions. CT MAXILLOFACIAL FINDINGS Osseous: No fracture or mandibular dislocation. No destructive process. Orbits: Negative. No traumatic or inflammatory finding. Sinuses: Moderate right axillary sinus mucosal thickening. Remaining sinuses are clear. Soft tissues: Negative. CT CERVICAL SPINE FINDINGS Alignment: No substantial sagittal subluxation. Skull base and vertebrae: No acute fracture. No primary bone lesion or focal  pathologic process. Soft tissues and spinal canal: No prevertebral fluid or swelling. No visible canal hematoma. Disc levels: Multilevel synovial hypertrophy with varying degrees of neural foraminal stenosis, likely greatest on the right at C2-C3. Upper chest: Visualized lung apices are clear. IMPRESSION: 1. No evidence of acute abnormality intracranially or in the cervical spine. 2. No facial fracture. Electronically Signed   By: Stevenson Elbe M.D.   On: 03/15/2024 22:58   CT Maxillofacial Wo Contrast Result Date: 03/15/2024 CLINICAL DATA:  Polytrauma, blunt; Facial trauma, blunt EXAM: CT HEAD WITHOUT CONTRAST CT MAXILLOFACIAL WITHOUT CONTRAST CT CERVICAL SPINE WITHOUT CONTRAST TECHNIQUE: Multidetector CT imaging of the head, cervical spine, and maxillofacial structures were performed using the standard protocol without intravenous contrast. Multiplanar CT image reconstructions of the cervical spine and maxillofacial structures were also generated. RADIATION DOSE REDUCTION: This exam was performed according to the departmental dose-optimization program which includes automated exposure control, adjustment of the mA and/or kV according to patient size and/or use of iterative reconstruction technique. COMPARISON:  None Available. FINDINGS: CT HEAD FINDINGS Brain: No evidence of acute infarction, hemorrhage, hydrocephalus, extra-axial collection or mass lesion/mass effect. Vascular: No hyperdense vessel or unexpected calcification. Skull: No acute fracture. Other: No mastoid effusions. CT MAXILLOFACIAL FINDINGS Osseous: No fracture or mandibular dislocation. No destructive process. Orbits: Negative. No traumatic or inflammatory finding. Sinuses: Moderate right axillary sinus mucosal thickening. Remaining sinuses are clear. Soft tissues:  Negative. CT CERVICAL SPINE FINDINGS Alignment: No substantial sagittal subluxation. Skull base and vertebrae: No acute fracture. No primary bone lesion or focal pathologic  process. Soft tissues and spinal canal: No prevertebral fluid or swelling. No visible canal hematoma. Disc levels: Multilevel synovial hypertrophy with varying degrees of neural foraminal stenosis, likely greatest on the right at C2-C3. Upper chest: Visualized lung apices are clear. IMPRESSION: 1. No evidence of acute abnormality intracranially or in the cervical spine. 2. No facial fracture. Electronically Signed   By: Stevenson Elbe M.D.   On: 03/15/2024 22:58   DG Chest Port 1 View Result Date: 03/15/2024 CLINICAL DATA:  Trauma due to a fall. Patient fell forward abdomen rocking chair with abrasions to the nose. Intoxicated. EXAM: PORTABLE CHEST 1 VIEW COMPARISON:  None Available. FINDINGS: Shallow inspiration. Heart size and pulmonary vascularity are normal for technique. Lungs are clear. No pleural effusion or pneumothorax. Mediastinal contours appear intact. Visualized bones are nondisplaced. IMPRESSION: Shallow inspiration.  No evidence of active pulmonary disease. Electronically Signed   By: Boyce Byes M.D.   On: 03/15/2024 17:43    Scheduled Meds:    DULoxetine   60 mg Oral Daily   enoxaparin  (LOVENOX ) injection  40 mg Subcutaneous Q24H   folic acid   1 mg Oral Daily   gabapentin   600 mg Oral TID   LORazepam   0-4 mg Intravenous Q4H   Followed by   Cecily Cohen ON 03/18/2024] LORazepam   0-4 mg Intravenous Q8H   multivitamin with minerals  1 tablet Oral Daily   propranolol   10 mg Oral BID   QUEtiapine   100 mg Oral QHS   thiamine   100 mg Oral Daily   Or   thiamine   100 mg Intravenous Daily   traZODone   100 mg Oral QHS    Continuous Infusions:    sodium chloride  125 mL/hr at 03/17/24 1610   ampicillin -sulbactam (UNASYN ) IV Stopped (03/17/24 0640)     LOS: 0 days     Aubrey Blas, MD,  FACP, [facility], [facility], [facility]   [facility] [place]      To contact the attending provider between 7A-7P or the covering provider during after hours 7P-7A,  please log into the web site [email or web address] and access using universal [place] password for that web site. If you do not have the password, please call the hospital operator.  03/17/2024, 8:19 AM

## 2024-03-17 NOTE — Progress Notes (Signed)
 CSW added substance abuse resources to patient's AVS.  Edwin Dada, MSW, LCSW Transitions of Care  Clinical Social Worker II [phone]

## 2024-03-18 DIAGNOSIS — R112 Nausea with vomiting, unspecified: Secondary | ICD-10-CM | POA: Diagnosis not present

## 2024-03-18 DIAGNOSIS — E876 Hypokalemia: Secondary | ICD-10-CM | POA: Diagnosis not present

## 2024-03-18 DIAGNOSIS — J189 Pneumonia, unspecified organism: Secondary | ICD-10-CM | POA: Diagnosis not present

## 2024-03-18 DIAGNOSIS — F10931 Alcohol use, unspecified with withdrawal delirium: Secondary | ICD-10-CM

## 2024-03-18 LAB — CBC
HCT: 35.7 % — ABNORMAL LOW (ref 39.0–52.0)
Hemoglobin: 11.6 g/dL — ABNORMAL LOW (ref 13.0–17.0)
MCH: 28.8 pg (ref 26.0–34.0)
MCHC: 32.5 g/dL (ref 30.0–36.0)
MCV: 88.6 fL (ref 80.0–100.0)
Platelets: 203 10*3/uL (ref 150–400)
RBC: 4.03 MIL/uL — ABNORMAL LOW (ref 4.22–5.81)
RDW: 13.4 % (ref 11.5–15.5)
WBC: 7.3 10*3/uL (ref 4.0–10.5)
nRBC: 0 % (ref 0.0–0.2)

## 2024-03-18 LAB — MAGNESIUM: Magnesium: 1.6 mg/dL — ABNORMAL LOW (ref 1.7–2.4)

## 2024-03-18 LAB — COMPREHENSIVE METABOLIC PANEL WITH GFR
ALT: 18 U/L (ref 0–44)
AST: 37 U/L (ref 15–41)
Albumin: 2.9 g/dL — ABNORMAL LOW (ref 3.5–5.0)
Alkaline Phosphatase: 72 U/L (ref 38–126)
Anion gap: 8 (ref 5–15)
BUN: <5 mg/dL — ABNORMAL LOW (ref 6–20)
CO2: 28 mmol/L (ref 22–32)
Calcium: 8.6 mg/dL — ABNORMAL LOW (ref 8.9–10.3)
Chloride: 100 mmol/L (ref 98–111)
Creatinine, Ser: 0.81 mg/dL (ref 0.61–1.24)
GFR, Estimated: >60 mL/min (ref >60)
Glucose, Bld: 93 mg/dL (ref 70–99)
Potassium: 3.2 mmol/L — ABNORMAL LOW (ref 3.5–5.1)
Sodium: 136 mmol/L (ref 135–145)
Total Bilirubin: 1.2 mg/dL (ref 0.0–1.2)
Total Protein: 6.3 g/dL — ABNORMAL LOW (ref 6.5–8.1)

## 2024-03-18 LAB — PROCALCITONIN: Procalcitonin: 0.14 ng/mL

## 2024-03-18 LAB — POTASSIUM: Potassium: 3.8 mmol/L (ref 3.5–5.1)

## 2024-03-18 MED ORDER — MAGNESIUM SULFATE 2 GM/50ML IV SOLN
2.0000 g | Freq: Once | INTRAVENOUS | Status: AC
Start: 2024-03-18 — End: 2024-03-18
  Administered 2024-03-18: 2 g via INTRAVENOUS
  Filled 2024-03-18: qty 50

## 2024-03-18 MED ORDER — POTASSIUM CHLORIDE CRYS ER 20 MEQ PO TBCR
40.0000 meq | EXTENDED_RELEASE_TABLET | ORAL | Status: DC
Start: 2024-03-18 — End: 2024-03-18
  Administered 2024-03-18: 40 meq via ORAL
  Filled 2024-03-18: qty 2

## 2024-03-18 MED ORDER — POTASSIUM CHLORIDE 10 MEQ/100ML IV SOLN
10.0000 meq | INTRAVENOUS | Status: AC
Start: 2024-03-18 — End: 2024-03-18
  Administered 2024-03-18 (×2): 10 meq via INTRAVENOUS
  Filled 2024-03-18 (×2): qty 100

## 2024-03-18 MED ORDER — IPRATROPIUM-ALBUTEROL 0.5-2.5 (3) MG/3ML IN SOLN: 3.0000 mL | Freq: Four times a day (QID) | RESPIRATORY_TRACT | Status: DC | PRN

## 2024-03-18 MED ORDER — MAGNESIUM SULFATE 4 GM/100ML IV SOLN
4.0000 g | Freq: Once | INTRAVENOUS | Status: AC
  Administered 2024-03-18: 4 g via INTRAVENOUS
  Filled 2024-03-18 (×2): qty 100

## 2024-03-18 MED ORDER — CALCIUM GLUCONATE-NACL 1-0.675 GM/50ML-% IV SOLN
1.0000 g | Freq: Once | INTRAVENOUS | Status: AC
  Administered 2024-03-18: 1000 mg via INTRAVENOUS
  Filled 2024-03-18: qty 50

## 2024-03-18 MED ORDER — DEXMEDETOMIDINE HCL IN NACL 400 MCG/100ML IV SOLN
0.0000 ug/kg/h | INTRAVENOUS | Status: DC
  Administered 2024-03-18: 0.4 ug/kg/h via INTRAVENOUS
  Filled 2024-03-18 (×3): qty 100

## 2024-03-18 MED ORDER — IPRATROPIUM-ALBUTEROL 0.5-2.5 (3) MG/3ML IN SOLN
3.0000 mL | Freq: Four times a day (QID) | RESPIRATORY_TRACT | Status: DC
  Administered 2024-03-18: 3 mL via RESPIRATORY_TRACT
  Filled 2024-03-18: qty 3

## 2024-03-18 MED ORDER — POTASSIUM CHLORIDE 10 MEQ/100ML IV SOLN
10.0000 meq | INTRAVENOUS | Status: DC
  Administered 2024-03-18 (×2): 10 meq via INTRAVENOUS
  Filled 2024-03-18 (×2): qty 100

## 2024-03-18 NOTE — Consult Note (Signed)
 NAME:  Xavier Owens, MRN:  [id number], DOB:  [birth year], LOS: 1 ADMISSION DATE:  03/16/2024, CONSULTATION DATE:  03/18/24 REFERRING MD:  Sherrod Dolphin, CHIEF COMPLAINT:  ETOH withdrawal    History of Present Illness:  Xavier Owens is a 53 y.o. M with PMH significant for ETOH abuse, tremor, neuropathy who per prior records was recently admitted for ETOH withdrawal at behavioral health and then was discharged home, shortly thereafter he was find intoxicated in a neighbor's yard and brought back to the ED intoxicated with intractable nausea and vomiting, ETOH >400 and lactic acid 8.0 .  He was admitted and placed on CIWA and treated with Ativan  and Unasyn  for aspiration.  His Ativan  requirements and CIWA score uptrended and he required 4 point restraints so PCCM consulted for possible precedex  infusion  Pertinent  Medical History   has a past medical history of Tremor.   Significant Hospital Events: Including procedures, antibiotic start and stop dates in addition to other pertinent events   5/3 admit with n/v and withdrawal 5/5 increased CIWA score and restraints, ICU txfr and restraints   Interim History / Subjective:  As above  Objective   Blood pressure (!) 131/97, pulse 97, temperature 98 F (36.7 C), temperature source Oral, resp. rate (!) 23, height 5\' 7"  (1.702 m), weight 81.6 kg, SpO2 100%.        Intake/Output Summary (Last 24 hours) at 03/18/2024 1315 Last data filed at 03/18/2024 0048 Gross per 24 hour  Intake 200 ml  Output --  Net 200 ml   Filed Weights   03/16/24 1513  Weight: 81.6 kg    General:  chronically and acutely ill-appearing M restless in restraints and trying slide out of bed  HEENT: MM pink/moist, healing superficial abrasions to the face  Neuro: alert, oriented to person only, agitated and minimally re-directable, moving all extremities and non-focal  CV: s1s2 rrr, no m/r/g PULM:  clear bilaterally on RA GI: soft, bsx4 active  Extremities: warm/dry,  no edema  Skin: no rashes or lesions   Resolved Hospital Problem list   Lactic acidosis   Assessment & Plan:    ETOH withdrawal with DT's -transfer to ICU -continue CIWA and prn Ativan , add precedex  gtt -continue thiamine , folic acid  and MV -consider phenobarbital taper -seizure precautions   LLL Pneumonia Possible aspiration in the setting of N/v -continue unasyn  -lactic acid has cleared, no O2 requirement -CTA chest without PE, Xarelto  on home medication list, do not see an indication and reviewed duplicate chart-do not see why patient was prescribed this.  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 -noted on CT, outpatient f/u    Hypokalemia, hypomagnesemia -trend and replete Prn  Mood disorder  Tremor -hold Seroquel , Trazodone , neurontin  and Propranolol  for now  Best Practice (right click and "Reselect all SmartList Selections" daily)   Diet/type: NPO DVT prophylaxis LMWH Pressure ulcer(s): N/A GI prophylaxis: N/A Lines: N/A Foley:  N/A Code Status:  full code Last date of multidisciplinary goals of care discussion [pending]  Labs   CBC: Recent Labs  Lab 03/15/24 1729 03/15/24 1735 03/16/24 1519 03/16/24 1739 03/16/24 2332 03/17/24 0403 03/18/24 0348  WBC 9.4  --  17.2*  --  11.8* 7.9 7.3  NEUTROABS 7.3  --   --   --   --  6.2  --   HGB 13.3   < > 13.9 14.3 12.1* 11.6* 11.6*  HCT 41.4   < > 43.5 42.0  37.1* 35.3* 35.7*  MCV 89.2  --  89.1  --  88.3 88.3 88.6  PLT 302  --  323  --  229 222 203   < > = values in this interval not displayed.    Basic Metabolic Panel: Recent Labs  Lab 03/15/24 1729 03/15/24 1735 03/16/24 1519 03/16/24 1739 03/16/24 2332 03/17/24 0403 03/18/24 0348  NA 142 142 141 138  --  136 136  K 3.3* 3.1* 3.4* 3.6  --  3.2* 3.2*  CL 101 102 95*  --   --  97* 100  CO2 19*  --  18*  --   --  27 28  GLUCOSE 90 93 104*  --   --  86 93  BUN <5* <3* <5*  --   --  <5* <5*  CREATININE  0.79 1.30* 0.97  --  0.76 0.72 0.81  CALCIUM  8.6*  --  9.6  --   --  8.2* 8.6*  MG  --   --   --   --   --  1.3*  --    GFR: Estimated Creatinine Clearance: 106.6 mL/min (by C-G formula based on SCr of 0.81 mg/dL). Recent Labs  Lab 03/16/24 1519 03/16/24 1740 03/16/24 2006 03/16/24 2332 03/17/24 0403 03/17/24 0620 03/17/24 0831 03/18/24 0348  PROCALCITON  --   --   --   --   --   --   --  0.14  WBC 17.2*  --   --  11.8* 7.9  --   --  7.3  LATICACIDVEN  --  8.0* 6.6*  --   --  0.7 1.0  --     Liver Function Tests: Recent Labs  Lab 03/15/24 1729 03/16/24 2332 03/17/24 0403 03/18/24 0348  AST 55* 50* 41 37  ALT 21 20 19 18   ALKPHOS 92 83 74 72  BILITOT 0.7 1.4* 1.4* 1.2  PROT 8.0 6.6 6.2* 6.3*  ALBUMIN 4.0 3.2* 3.0* 2.9*   Recent Labs  Lab 03/16/24 2332  LIPASE 33   Recent Labs  Lab 03/15/24 1747  AMMONIA 36*    ABG    Component Value Date/Time   HCO3 25.4 03/16/2024 1739   TCO2 26 03/16/2024 1739   O2SAT 76 03/16/2024 1739     Coagulation Profile: No results for input(s): "INR", "PROTIME" in the last 168 hours.  Cardiac Enzymes: No results for input(s): "CKTOTAL", "CKMB", "CKMBINDEX", "TROPONINI" in the last 168 hours.  HbA1C: No results found for: "HGBA1C"  CBG: Recent Labs  Lab 03/15/24 1743 03/17/24 1540  GLUCAP 97 73    Review of Systems:   Unable to obtain secondary to encephalopathy  Past Medical History:  He,  has a past medical history of Tremor.   Surgical History:  History reviewed. No pertinent surgical history.   Social History:   reports current alcohol use.   Family History:  His Family history is unknown by patient.   Allergies No Known Allergies   Home Medications  Prior to Admission medications   Medication Sig Start Date End Date Taking? Authorizing Provider  chlordiazePOXIDE  (LIBRIUM ) 25 MG capsule 50mg  PO TID x 2D, then 25-50mg  PO BID X 2D, then 25-50mg  PO QD X 1D Patient taking differently: Take 25 mg by  mouth daily. 50mg  PO TID x 2D, then 25-50mg  PO BID X 2D, then 25-50mg  PO QD X 1D 03/16/24  Yes Mesner, [name], MD  DULoxetine  (CYMBALTA ) 60 MG capsule Take 60 mg by mouth daily. 03/02/24  Yes [provider]  gabapentin  (NEURONTIN ) 300 MG capsule Take 600 mg by mouth 3 (three) times daily. 02/07/24  Yes [provider]  naltrexone  (DEPADE) 50 MG tablet Take 50 mg by mouth daily. 02/03/24  Yes [provider]  potassium chloride  SA (KLOR-CON  M) 20 MEQ tablet Take 2 tablets (40 mEq total) by mouth daily for 7 days. Patient taking differently: Take 20 mEq by mouth daily. 03/16/24 03/23/24 Yes Mesner, [name], MD  propranolol  (INDERAL ) 10 MG tablet Take 10 mg by mouth 2 (two) times daily. 03/02/24  Yes [provider]  QUEtiapine  (SEROQUEL ) 100 MG tablet Take 100 mg by mouth at bedtime. 03/02/24  Yes [provider]  traZODone  (DESYREL ) 100 MG tablet Take 100 mg by mouth at bedtime. 03/02/24  Yes [provider]  XARELTO  10 MG TABS tablet Take 10 mg by mouth daily. 02/03/24  Yes [provider]     Critical care time: 45 minutes       CRITICAL CARE Performed by: Patt Boozer Kimmarie Pascale   Total critical care time: 45 minutes  Critical care time was exclusive of separately billable procedures and treating other patients.  Critical care was necessary to treat or prevent imminent or life-threatening deterioration.  Critical care was time spent personally by me on the following activities: development of treatment plan with patient and/or surrogate as well as nursing, discussions with consultants, evaluation of patient's response to treatment, examination of patient, obtaining history from patient or surrogate, ordering and performing treatments and interventions, ordering and review of laboratory studies, ordering and review of radiographic studies, pulse oximetry and re-evaluation of patient's condition.  Patt Boozer Giovany Cosby, [name] [facility] See Amion for pager If no response to pager , please call [phone] until 7pm After 7:00 pm call Elink  [phone]

## 2024-03-18 NOTE — Progress Notes (Addendum)
 PROGRESS NOTE   Xavier Owens  ZOX:[id number]    DOB: August 07, 1971    DOA: 03/16/2024  PCP: Pcp, No   I have briefly reviewed patients previous medical records in [facility].   Brief Hospital Course:  53 year old male, lives alone, independent, reports that he works in [employer], medical history significant for alcohol use disorder, alcohol withdrawal seizures, delirium tremens, essential tremor, mood disorder, neuropathy, H&P notes recent hospitalization last month for alcohol withdrawal, was at behavioral health for detox (however, able to find this on chart review or Care Everywhere), seen in ED on 5/2 and discharged on 5/3 when he had presented for alcohol intoxication, fall, had required meds such as Geodon , restraints, after discharging home, returned to ED a few hours later by EMS with complaints of chest pain, shortness of breath, vomiting.  He reportedly was found in his yard.  He was admitted for intractable nausea and vomiting, lactic acidosis and alcohol withdrawal.  Ongoing alcohol withdrawal delirium  Addendum: Since my evaluation this morning, patient has progressively worsened.  Despite several doses of Ativan  since midnight tonight, continues to be agitated, despite being on 4 point soft restraints, tele sitter, pulled out 2 IVs, biting the mitts.  CIWA scores in the high 20s.  I feel that he is appropriate for transfer to ICU for Precedex  drip.  Consulted PCCM.   Assessment & Plan:   Intractable nausea and vomiting: May be alcohol related gastritis/esophagitis. CT A/P: Shows fatty infiltration of the liver but no acute findings to explain his symptoms. Lipase normal/33.  HS Troponin negative. Continue PPI and as needed antiemetics Diet was advanced and no report of further vomiting.  Lactic acidosis Presented with serum lactate of 8.   Likely due to alcohol abuse, poor perfusion due to dehydration from intractable nausea and vomiting Aggressively hydrated with IV  fluids and this has resolved  Alcohol use disorder, severe, with alcohol withdrawal delirium Blood alcohol level on 5/2 was 495, when he returned on 5/3 it was <15 History of delirium tremens and alcohol withdrawal seizures CIWA scores high: 20 < 19 < 14 < 9 Continue CIWA protocol, including scheduled and as needed lorazepam , thiamine , folate and multivitamins. Monitor closely.  Seizure precautions Recommend in person safety sitter to reorient and for safety.  Currently on soft wrist restraints.  Suspected aspiration pneumonia CT C/A/P: Patchy groundglass opacities in the left lower lobe worrisome for infectious/inflammatory process Continue empirically started IV Unasyn  for now and could consider discharging on Augmentin. Suspected due to nausea and vomiting and earlier AMS  Hypokalemia/hypomagnesemia Continue to replace aggressively and follow.  Potassium 3.2 and magnesium  1.3.  Anemia Follow CBC.  At risk for pancytopenia.  Hemoglobin stable.  S/p recent fall Recent CT head x 2, CT cervical spine and maxillofacial and chest x-ray without acute findings.  PT evaluation when able.  Left femoral head?  Avascular necrosis Noted on CT Outpatient follow-up..  Tobacco abuse Cessation counseled.  Essential tremors on propranolol . Neuropathy on gabapentin . Mood disorder on Seroquel , Cymbalta  and trazodone .  Body mass index is 28.18 kg/m.   DVT prophylaxis: enoxaparin  (LOVENOX ) injection 40 mg Start: 03/16/24 2200     Code Status: Full Code:  Family Communication:  Disposition:  Inpatient appropriate due to ongoing alcohol withdrawal, receiving treatment for same, IV antibiotics for aspiration pneumonia.     Consultants:     Procedures:     Subjective:  Patient alert but oriented only to self.  Feels that he is at his home  in [place], living down the street from his dad.  He thinks that he knows this MD and that this MD lives on "[address]" (either true) and that  patient's nurse is his niece (not true either).  Per nursing, earlier this morning attempted to get out of bed and leave the floor and another patient's family member had to alert nursing.  Impulsive, trying to get out of bed, pulling on telemetry leads, urinating on the floor.  At risk for falls and further injuries.  Objective:   Vitals:   03/17/24 1940 03/18/24 0016 03/18/24 0418 03/18/24 0804  BP: 138/73 123/85 120/80 122/88  Pulse: 79 71 70 87  Resp: 19  19 18   Temp: 98 F (36.7 C) 98.3 F (36.8 C) 97.7 F (36.5 C) 98 F (36.7 C)  TempSrc: Oral Oral Oral Oral  SpO2: 96% 94% 95% 97%  Weight:      Height:        General exam: Young male, moderately built and nourished lying comfortably propped up in bed without distress.  Oral mucosa moist. HEENT: Dried blood on his nasal bridge, lower lip.  All better. Respiratory system: Clear to auscultation.  No increased work of breathing. Cardiovascular system: S1 & S2 heard, RRR. No JVD, murmurs, rubs, gallops or clicks. No pedal edema.  Telemetry personally reviewed: Sinus rhythm. Gastrointestinal system: Abdomen is nondistended, soft and nontender. No organomegaly or masses felt. Normal bowel sounds heard. Central nervous system: Alert and oriented only to self. No focal neurological deficits. Extremities: Symmetric 5 x 5 power.  Abrasions on his left knee area >right knee. Skin: No rashes, lesions or ulcers Psychiatry: Judgement and insight early impaired. Mood & affect appropriate.  Maybe a little anxious but not tremulous.    Data Reviewed:   I have personally reviewed following labs and imaging studies   CBC: Recent Labs  Lab 03/15/24 1729 03/15/24 1735 03/16/24 2332 03/17/24 0403 03/18/24 0348  WBC 9.4   < > 11.8* 7.9 7.3  NEUTROABS 7.3  --   --  6.2  --   HGB 13.3   < > 12.1* 11.6* 11.6*  HCT 41.4   < > 37.1* 35.3* 35.7*  MCV 89.2   < > 88.3 88.3 88.6  PLT 302   < > 229 222 203   < > = values in this interval not  displayed.    Basic Metabolic Panel: Recent Labs  Lab 03/15/24 1729 03/15/24 1735 03/16/24 1519 03/16/24 1739 03/16/24 2332 03/17/24 0403 03/18/24 0348  NA 142 142 141 138  --  136 136  K 3.3* 3.1* 3.4* 3.6  --  3.2* 3.2*  CL 101 102 95*  --   --  97* 100  CO2 19*  --  18*  --   --  27 28  GLUCOSE 90 93 104*  --   --  86 93  BUN <5* <3* <5*  --   --  <5* <5*  CREATININE 0.79 1.30* 0.97  --  0.76 0.72 0.81  CALCIUM  8.6*  --  9.6  --   --  8.2* 8.6*  MG  --   --   --   --   --  1.3*  --     Liver Function Tests: Recent Labs  Lab 03/15/24 1729 03/16/24 2332 03/17/24 0403 03/18/24 0348  AST 55* 50* 41 37  ALT 21 20 19 18   ALKPHOS 92 83 74 72  BILITOT 0.7 1.4* 1.4* 1.2  PROT 8.0  6.6 6.2* 6.3*  ALBUMIN 4.0 3.2* 3.0* 2.9*    CBG: Recent Labs  Lab 03/15/24 1743 03/17/24 1540  GLUCAP 97 73    Microbiology Studies:  No results found for this or any previous visit (from the past 240 hours).  Radiology Studies:  CT CHEST ABDOMEN PELVIS W CONTRAST Result Date: 03/16/2024 CLINICAL DATA:  Bowel obstruction suspected.  Chest wall pain. EXAM: CT CHEST, ABDOMEN, AND PELVIS WITH CONTRAST TECHNIQUE: Multidetector CT imaging of the chest, abdomen and pelvis was performed following the standard protocol during bolus administration of intravenous contrast. RADIATION DOSE REDUCTION: This exam was performed according to the departmental dose-optimization program which includes automated exposure control, adjustment of the mA and/or kV according to patient size and/or use of iterative reconstruction technique. CONTRAST:  75mL OMNIPAQUE  IOHEXOL  350 MG/ML SOLN COMPARISON:  CT angiogram chest 05/03/2021. FINDINGS: CT CHEST FINDINGS Cardiovascular: Heart is borderline enlarged. There is no pericardial effusion. Aorta is normal in size. Mediastinum/Nodes: No enlarged mediastinal, hilar, or axillary lymph nodes. Thyroid  gland, trachea, and esophagus demonstrate no significant findings.  Lungs/Pleura: There are minimal atelectatic changes in the bilateral lower lobes. There is some patchy ground-glass opacities in the left lower lobe is well. The lungs are otherwise clear. There is no pleural effusion or pneumothorax. There is a stable 2 mm nodule in the right middle lobe image 5/68 compatible with benign etiology. Musculoskeletal: No acute osseous abnormality. There are mild chronic compression deformities of the superior endplate of T5 and T6 similar to prior. CT ABDOMEN PELVIS FINDINGS Hepatobiliary: There is diffuse fatty infiltration of the liver. The gallbladder and bile ducts are within normal limits. Pancreas: Unremarkable. No pancreatic ductal dilatation or surrounding inflammatory changes. Spleen: Normal in size without focal abnormality. Adrenals/Urinary Tract: Adrenal glands are unremarkable. Kidneys are normal, without renal calculi, focal lesion, or hydronephrosis. Bladder is unremarkable. Stomach/Bowel: Stomach is within normal limits. Appendix appears normal. No evidence of bowel wall thickening, distention, or inflammatory changes. There is sigmoid and descending colon diverticulosis. Vascular/Lymphatic: No significant vascular findings are present. No enlarged abdominal or pelvic lymph nodes. Reproductive: Prostate is unremarkable. Other: No abdominal wall hernia or abnormality. No abdominopelvic ascites. Musculoskeletal: No acute fracture or dislocation identified. There are mixed sclerotic and lucent bubbly appearance identified in the anterior left femoral head. No collapse of the cortex identified. IMPRESSION: 1. Patchy ground-glass opacities in the left lower lobe worrisome for infectious/inflammatory process. 2. Fatty infiltration of the liver. 3. Colonic diverticulosis. 4. Mixed sclerotic and lucent bubbly appearance in the anterior left femoral head worrisome for avascular necrosis. No collapse of the cortex identified. Electronically Signed   By: Tyron Gallon M.D.   On:  03/16/2024 22:11   CT HEAD WO CONTRAST ( ) Result Date: 03/16/2024 CLINICAL DATA:  Mental status change, unknown cause EXAM: CT HEAD WITHOUT CONTRAST TECHNIQUE: Contiguous axial images were obtained from the base of the skull through the vertex without intravenous contrast. RADIATION DOSE REDUCTION: This exam was performed according to the departmental dose-optimization program which includes automated exposure control, adjustment of the mA and/or kV according to patient size and/or use of iterative reconstruction technique. COMPARISON:  None Available. FINDINGS: Brain: No evidence of acute infarction, hemorrhage, hydrocephalus, extra-axial collection or mass lesion/mass effect. Vascular: No hyperdense vessel. Skull: No acute fracture. Sinuses/Orbits: Mild paranasal sinus mucosal thickening. No acute orbital findings. Other: No mastoid effusions. IMPRESSION: No evidence of acute intracranial abnormality. Electronically Signed   By: Stevenson Elbe M.D.   On: 03/16/2024 22:01  DG Chest 2 View Result Date: 03/16/2024 CLINICAL DATA:  Shortness of breath and chest pain. EXAM: CHEST - 2 VIEW COMPARISON:  Chest radiograph dated 03/15/2024. FINDINGS: No focal consolidation, pleural effusion or pneumothorax. The cardiac silhouette is within normal limits. No acute osseous pathology. IMPRESSION: No active cardiopulmonary disease. Electronically Signed   By: Angus Bark M.D.   On: 03/16/2024 17:08    Scheduled Meds:    DULoxetine   60 mg Oral Daily   enoxaparin  (LOVENOX ) injection  40 mg Subcutaneous Q24H   folic acid   1 mg Oral Daily   gabapentin   600 mg Oral TID   LORazepam   0-4 mg Intravenous Q4H   Followed by   LORazepam   0-4 mg Intravenous Q8H   multivitamin with minerals  1 tablet Oral Daily   pantoprazole   40 mg Oral Daily   potassium chloride   40 mEq Oral Q4H   propranolol   10 mg Oral BID   QUEtiapine   100 mg Oral QHS   thiamine   100 mg Oral Daily   Or   thiamine   100 mg Intravenous  Daily   traZODone   100 mg Oral QHS    Continuous Infusions:    ampicillin -sulbactam (UNASYN ) IV Stopped (03/18/24 0023)   magnesium  sulfate bolus IVPB       LOS: 1 day     Aubrey Blas, MD,  FACP, [facility], [facility], [facility]   [facility] [place]      To contact the attending provider between 7A-7P or the covering provider during after hours 7P-7A, please log into the web site [email or web address] and access using universal [place] password for that web site. If you do not have the password, please call the hospital operator.  03/18/2024, 8:53 AM

## 2024-03-18 NOTE — Progress Notes (Signed)
 Called 2H to give report, RN is unavailable at this time. Left call back number.

## 2024-03-18 NOTE — Progress Notes (Addendum)
 Tellesitter ordered and set up. Patient found wondering in the hall. Soft wrist restraints applied, per MD. Ativan  given per order.  Ahmiyah Coil L Tavion Senkbeil, RN

## 2024-03-18 NOTE — Progress Notes (Signed)
 Returned spouse's phone call, it went to voicemail with a full mailbox.

## 2024-03-19 DIAGNOSIS — E878 Other disorders of electrolyte and fluid balance, not elsewhere classified: Secondary | ICD-10-CM

## 2024-03-19 DIAGNOSIS — E8721 Acute metabolic acidosis: Secondary | ICD-10-CM

## 2024-03-19 DIAGNOSIS — J189 Pneumonia, unspecified organism: Secondary | ICD-10-CM | POA: Diagnosis not present

## 2024-03-19 DIAGNOSIS — F10931 Alcohol use, unspecified with withdrawal delirium: Secondary | ICD-10-CM | POA: Diagnosis not present

## 2024-03-19 LAB — BASIC METABOLIC PANEL WITH GFR
Anion gap: 12 (ref 5–15)
BUN: 6 mg/dL (ref 6–20)
CO2: 20 mmol/L — ABNORMAL LOW (ref 22–32)
Calcium: 9.2 mg/dL (ref 8.9–10.3)
Chloride: 103 mmol/L (ref 98–111)
Creatinine, Ser: 0.74 mg/dL (ref 0.61–1.24)
GFR, Estimated: >60 mL/min (ref >60)
Glucose, Bld: 104 mg/dL — ABNORMAL HIGH (ref 70–99)
Potassium: 3.9 mmol/L (ref 3.5–5.1)
Sodium: 135 mmol/L (ref 135–145)

## 2024-03-19 LAB — CBC
HCT: 38 % — ABNORMAL LOW (ref 39.0–52.0)
Hemoglobin: 12.4 g/dL — ABNORMAL LOW (ref 13.0–17.0)
MCH: 28.7 pg (ref 26.0–34.0)
MCHC: 32.6 g/dL (ref 30.0–36.0)
MCV: 88 fL (ref 80.0–100.0)
Platelets: 220 10*3/uL (ref 150–400)
RBC: 4.32 MIL/uL (ref 4.22–5.81)
RDW: 13.5 % (ref 11.5–15.5)
WBC: 6.1 10*3/uL (ref 4.0–10.5)
nRBC: 0 % (ref 0.0–0.2)

## 2024-03-19 LAB — MAGNESIUM: Magnesium: 2.5 mg/dL — ABNORMAL HIGH (ref 1.7–2.4)

## 2024-03-19 LAB — PHOSPHORUS: Phosphorus: 2.3 mg/dL — ABNORMAL LOW (ref 2.5–4.6)

## 2024-03-19 MED ORDER — LOPERAMIDE HCL 2 MG PO CAPS: 2.0000 mg | ORAL_CAPSULE | ORAL | Status: AC | PRN

## 2024-03-19 MED ORDER — ONDANSETRON 4 MG PO TBDP: 4.0000 mg | ORAL_TABLET | Freq: Four times a day (QID) | ORAL | Status: AC | PRN

## 2024-03-19 MED ORDER — HYDROXYZINE HCL 25 MG PO TABS
25.0000 mg | ORAL_TABLET | Freq: Four times a day (QID) | ORAL | Status: AC | PRN
  Administered 2024-03-19: 25 mg via ORAL
  Filled 2024-03-19: qty 1

## 2024-03-19 MED ORDER — CHLORDIAZEPOXIDE HCL 25 MG PO CAPS
25.0000 mg | ORAL_CAPSULE | Freq: Three times a day (TID) | ORAL | Status: AC
  Administered 2024-03-20 – 2024-03-21 (×3): 25 mg via ORAL
  Filled 2024-03-19 (×3): qty 1

## 2024-03-19 MED ORDER — CHLORDIAZEPOXIDE HCL 25 MG PO CAPS
25.0000 mg | ORAL_CAPSULE | Freq: Four times a day (QID) | ORAL | Status: AC
  Administered 2024-03-19 – 2024-03-20 (×4): 25 mg via ORAL
  Filled 2024-03-19 (×4): qty 1

## 2024-03-19 MED ORDER — CHLORDIAZEPOXIDE HCL 25 MG PO CAPS: 25.0000 mg | ORAL_CAPSULE | ORAL | Status: DC

## 2024-03-19 MED ORDER — POTASSIUM PHOSPHATES 15 MMOLE/5ML IV SOLN
15.0000 mmol | Freq: Once | INTRAVENOUS | Status: AC
  Administered 2024-03-19: 15 mmol via INTRAVENOUS
  Filled 2024-03-19: qty 5

## 2024-03-19 MED ORDER — CHLORDIAZEPOXIDE HCL 25 MG PO CAPS: 25.0000 mg | ORAL_CAPSULE | Freq: Every day | ORAL | Status: DC

## 2024-03-19 MED ORDER — CHLORDIAZEPOXIDE HCL 25 MG PO CAPS
25.0000 mg | ORAL_CAPSULE | Freq: Four times a day (QID) | ORAL | Status: AC | PRN
  Administered 2024-03-20: 25 mg via ORAL
  Filled 2024-03-19: qty 1

## 2024-03-19 MED ORDER — CHLORDIAZEPOXIDE HCL 25 MG PO CAPS
50.0000 mg | ORAL_CAPSULE | Freq: Once | ORAL | Status: AC
  Administered 2024-03-19: 50 mg via ORAL
  Filled 2024-03-19: qty 2

## 2024-03-19 MED ORDER — CHLORHEXIDINE GLUCONATE CLOTH 2 % EX PADS
6.0000 | MEDICATED_PAD | Freq: Every day | CUTANEOUS | Status: DC
  Administered 2024-03-20 – 2024-03-22 (×3): 6 via TOPICAL

## 2024-03-19 NOTE — Plan of Care (Signed)

## 2024-03-19 NOTE — Progress Notes (Addendum)
   NAME:  Xavier Owens, MRN:  [id number], DOB:  12/12/1970, LOS: 2 ADMISSION DATE:  03/16/2024, CONSULTATION DATE:  03/19/24 REFERRING MD:  Sherrod Dolphin, CHIEF COMPLAINT:  ETOH withdrawal    History of Present Illness:  Xavier Owens is a 53 y.o. M with PMH significant for ETOH abuse, tremor, neuropathy who per prior records was recently admitted for ETOH withdrawal at behavioral health and then was discharged home, shortly thereafter he was find intoxicated in a neighbor's yard and brought back to the ED intoxicated with intractable nausea and vomiting, ETOH >400 and lactic acid 8.0 .  He was admitted and placed on CIWA and treated with Ativan  and Unasyn  for aspiration.  His Ativan  requirements and CIWA score uptrended and he required 4 point restraints so PCCM consulted for possible precedex  infusion  Pertinent  Medical History   has a past medical history of Tremor.   Significant Hospital Events: Including procedures, antibiotic start and stop dates in addition to other pertinent events   5/3 admit with n/v and withdrawal 5/5 increased CIWA score and restraints, ICU txfr and restraints   Interim History / Subjective:   Objective   Blood pressure (!) 76/60, pulse 76, temperature 97.6 F (36.4 C), temperature source Axillary, resp. rate 15, height 5\' 7"  (1.702 m), weight 81.6 kg, SpO2 99%.        Intake/Output Summary (Last 24 hours) at 03/19/2024 0816 Last data filed at 03/19/2024 0600 Gross per 24 hour  Intake 1348.6 ml  Output 1900 ml  Net -551.4 ml   Filed Weights   03/16/24 1513  Weight: 81.6 kg   General resting in bed. No distress cooperative HENT NCAT has small abrasion on nose  Pulm clear Card rrr Abd soft no HM Ext warm dry areas of ecchymosis Neuro awake cooperative appropriate today no focal motor def oriented x 2 Gu voids has external cath system   Resolved Hospital Problem list   Lactic acidosis   Assessment & Plan:    ETOH withdrawal with DT's Currently  on 0.4 dex; has not been getting ativan  since last night  Plan Start librium  taper Wean dex to off He's doing well currently day 3 w/d req low dose dex. He is on neuronitin but feel like we are probably not giving him enough GABA coverage (since started the dex) the librium  taper will help  continue thiamine , folic acid  and MV seizure precautions   LLL Pneumonia Possible aspiration in the setting of N/v Plan Day 3/5 unasyn    Fall and possible L femoral head avascular necrosis -noted on CT Plan outpatient f/u   Fluid and Electrolyte imbalance: hypophosphatemia Plan Replace PO4  Mood disorder  Tremor Had been holding Seroquel , Trazodone , neurontin  and Propranolol ; start back if able to take POs(re: Neurontin  risk of wd address same way we treat ETOH)   Med rec On xarelto  at home. Prob poor candidate no clear reason for this Plan Hold for now   Best Practice (right click and "Reselect all SmartList Selections" daily)   Diet/type: Regular consistency (see orders) DVT prophylaxis LMWH Pressure ulcer(s): N/A GI prophylaxis: N/A Lines: N/A Foley:  N/A Code Status:  full code Last date of multidisciplinary goals of care discussion [pending]   Critical care time:         CRITICAL CARE Performed by: Armstead Bertrand

## 2024-03-19 NOTE — Progress Notes (Signed)
 Called 6E to give report. 6E nurse asked to call back later due to shift change.

## 2024-03-19 NOTE — Progress Notes (Signed)
 IMTS to assume care of this patient on 03/20/24 at 7:00 AM. Of note, patient is an [facility] patient (has another MRN).

## 2024-03-19 NOTE — Progress Notes (Signed)
 Doing well off dex Plan Xfer to progressive  IM svc to assume care

## 2024-03-19 NOTE — Progress Notes (Signed)
 [facility] signoff note   The care of this patient has been transferred from the [facility] service to the following service:  Service: PCCM Attending: Dr. Marene Shape. I have discussed with APP holding consult pager on 03/18/2024 Date & time: 03/19/2024, 8:03 AM  Severe alcohol use disorder with prior history of alcohol withdrawal seizures and delirium tremens, developed progressively worsening alcohol withdrawal delirium yesterday while on progressive care unit, not controlled with several doses of IV Ativan , prompting PCCM consult, transferred to ICU and remains on Precedex  drip.    [facility] will sign off at this time.   Please re consult the [facility] team for any further assistance.  Thank you   [employer]. MD Madlyn Schirmer, [facility], [facility]   [facility] & Physician Advisor [place]     To contact a [facility] provider, please log into the web site [email or web address] and access using universal [place] password for that web site. If you do not have the password, please call the hospital operator. Page "[facility] Admits & Consults" listed on top of the page. Provide a number where you can be directly reached.

## 2024-03-19 NOTE — Progress Notes (Signed)
 On librium  taper. We have now weaned the dex off.  He is calm and cooperative.  Plan Will cont to monitor off dex. For next couple hours Suspect we will be able to transition him back to progressive  Note he has another MRN in chart MRN: [id number]  Review of his most recent [facility] admit Discharged w/ dx of major Depressive disorder NOT schizophrenia   "Patient's home medication of duloxetine  20 mg was continued and titrated to 60 mg daily. Home gabapentin  600 mg 3 times daily was continued for peripheral neuropathy. Multivitamin, folate, and thiamine  were prescribed due to history of alcohol use disorder. Folate was tested and determined to be normal so folate was discontinued. He was started on naltrexone  50 mg daily titrated from 25 mg for alcohol use disorder. Home propranolol  10 mg twice daily was continued. Home quetiapine  was titrated from 100 to 200 mg; however, the patient experienced 2 episodes of urinary incontinence on the higher dose, so it was decreased back to 100 mg. He was continued on rivaroxaban . Colecalciferol was started for vitamin D  insufficiency. Trazodone  was decreased from 100 mg to 50 mg as Seroquel  was increased; however, as the patient experienced urinary incontinence with the higher dose of Seroquel , trazodone  was increased back to 100 mg".    He also had follow up  Follow-up Information       [facility] Follow up on 02/27/2024.   Specialty: Behavioral Health Why: You have an appointment for medication management services on 02/27/24 at 8:00 am.  You also have an appointment for therapy services on 03/06/24 at 1:00 pm.   I worry he may need further titration of his depression regimen. Could some of his ETOH be some form of self medication too?   Plan Will ask Psych to see ? Further in-patient titration of his MMD treatment  Again re: the listed xarelto  I see no clinical indication for this in either of his records. I think he is fall  risk-->will NOT resume xarelto 

## 2024-03-20 DIAGNOSIS — R112 Nausea with vomiting, unspecified: Secondary | ICD-10-CM | POA: Diagnosis not present

## 2024-03-20 DIAGNOSIS — F39 Unspecified mood [affective] disorder: Secondary | ICD-10-CM

## 2024-03-20 DIAGNOSIS — F331 Major depressive disorder, recurrent, moderate: Secondary | ICD-10-CM

## 2024-03-20 DIAGNOSIS — F10931 Alcohol use, unspecified with withdrawal delirium: Secondary | ICD-10-CM | POA: Diagnosis not present

## 2024-03-20 DIAGNOSIS — J690 Pneumonitis due to inhalation of food and vomit: Secondary | ICD-10-CM | POA: Diagnosis not present

## 2024-03-20 DIAGNOSIS — D649 Anemia, unspecified: Secondary | ICD-10-CM

## 2024-03-20 DIAGNOSIS — F10939 Alcohol use, unspecified with withdrawal, unspecified: Secondary | ICD-10-CM | POA: Diagnosis not present

## 2024-03-20 LAB — CBC
HCT: 36.9 % — ABNORMAL LOW (ref 39.0–52.0)
Hemoglobin: 12 g/dL — ABNORMAL LOW (ref 13.0–17.0)
MCH: 28.4 pg (ref 26.0–34.0)
MCHC: 32.5 g/dL (ref 30.0–36.0)
MCV: 87.4 fL (ref 80.0–100.0)
Platelets: 212 10*3/uL (ref 150–400)
RBC: 4.22 MIL/uL (ref 4.22–5.81)
RDW: 13.9 % (ref 11.5–15.5)
WBC: 6.4 10*3/uL (ref 4.0–10.5)
nRBC: 0 % (ref 0.0–0.2)

## 2024-03-20 LAB — BASIC METABOLIC PANEL WITH GFR
Anion gap: 8 (ref 5–15)
BUN: 6 mg/dL (ref 6–20)
CO2: 23 mmol/L (ref 22–32)
Calcium: 8.8 mg/dL — ABNORMAL LOW (ref 8.9–10.3)
Chloride: 104 mmol/L (ref 98–111)
Creatinine, Ser: 0.79 mg/dL (ref 0.61–1.24)
GFR, Estimated: >60 mL/min (ref >60)
Glucose, Bld: 90 mg/dL (ref 70–99)
Potassium: 3.5 mmol/L (ref 3.5–5.1)
Sodium: 135 mmol/L (ref 135–145)

## 2024-03-20 LAB — MAGNESIUM: Magnesium: 1.8 mg/dL (ref 1.7–2.4)

## 2024-03-20 LAB — PHOSPHORUS: Phosphorus: 3.7 mg/dL (ref 2.5–4.6)

## 2024-03-20 MED ORDER — QUETIAPINE FUMARATE 50 MG PO TABS
100.0000 mg | ORAL_TABLET | Freq: Every day | ORAL | Status: DC
  Administered 2024-03-20 – 2024-03-21 (×2): 100 mg via ORAL
  Filled 2024-03-20 (×2): qty 2

## 2024-03-20 MED ORDER — SENNOSIDES-DOCUSATE SODIUM 8.6-50 MG PO TABS
2.0000 | ORAL_TABLET | Freq: Two times a day (BID) | ORAL | Status: DC
  Administered 2024-03-21: 2 via ORAL
  Filled 2024-03-20 (×4): qty 2

## 2024-03-20 MED ORDER — FERROUS SULFATE 325 (65 FE) MG PO TABS
325.0000 mg | ORAL_TABLET | Freq: Every day | ORAL | Status: DC
  Administered 2024-03-20 – 2024-03-21 (×2): 325 mg via ORAL
  Filled 2024-03-20 (×2): qty 1

## 2024-03-20 MED ORDER — TRAZODONE HCL 100 MG PO TABS
100.0000 mg | ORAL_TABLET | Freq: Every day | ORAL | Status: DC
Start: 2024-03-20 — End: 2024-03-22
  Administered 2024-03-20 – 2024-03-21 (×2): 100 mg via ORAL
  Filled 2024-03-20 (×2): qty 1

## 2024-03-20 MED ORDER — POTASSIUM CHLORIDE CRYS ER 20 MEQ PO TBCR
40.0000 meq | EXTENDED_RELEASE_TABLET | Freq: Once | ORAL | Status: AC
  Administered 2024-03-20: 40 meq via ORAL
  Filled 2024-03-20: qty 2

## 2024-03-20 MED ORDER — LACTATED RINGERS IV BOLUS
1000.0000 mL | Freq: Once | INTRAVENOUS | Status: AC
  Administered 2024-03-20: 1000 mL via INTRAVENOUS

## 2024-03-20 MED ORDER — LACTATED RINGERS IV BOLUS
500.0000 mL | Freq: Once | INTRAVENOUS | Status: AC
  Administered 2024-03-20: 500 mL via INTRAVENOUS

## 2024-03-20 MED ORDER — MAGNESIUM SULFATE 2 GM/50ML IV SOLN
2.0000 g | Freq: Once | INTRAVENOUS | Status: DC
  Filled 2024-03-20: qty 50

## 2024-03-20 MED ORDER — ADULT MULTIVITAMIN W/MINERALS CH
1.0000 | ORAL_TABLET | Freq: Every day | ORAL | Status: DC
  Administered 2024-03-21 – 2024-03-22 (×2): 1 via ORAL
  Filled 2024-03-20: qty 1

## 2024-03-20 MED ORDER — MAGNESIUM SULFATE 2 GM/50ML IV SOLN
2.0000 g | Freq: Once | INTRAVENOUS | Status: AC
  Administered 2024-03-20: 2 g via INTRAVENOUS
  Filled 2024-03-20: qty 50

## 2024-03-20 NOTE — Hospital Course (Addendum)
 Mr. Xavier Owens is a 53 year old gentleman who presented to the emergency department in the setting of intoxication and aspiration.  The patient was treated for aspiration pneumonia with 5 days of Unasyn .  He required brief stay in the ICU for Precedex  due to alcohol withdrawal, after which a Librium  taper was prescribed and completed.  He has a history of withdrawal seizures, but at time of discharge his CIWA scores were 0.  Psychiatry was consulted, who stated that the patient would likely benefit from inpatient psychiatry management.  Ultimately the patient declined inpatient admission citing financial concerns and necessity to get back to work.  Prior to discharge, I did speak with the patient's father who confirmed that the patient will be staying with him after discharge.  The patient states that he does own firearms which are locked in a safe at his home.  He says his home is being renovated and he will be staying with his father and therefore will not have access to firearms.  I also did asked the patient's father if he would be able to secure the firearms and restrict the patient's access to them in this tenuous transition period.  He stated that he would be able to secure the firearms during this time.  Alcohol withdrawal Housing instability Depression As detailed above, no withdrawal seizure during his stay here.  At time of discharge the patient states he is doing well from a mental health standpoint, requesting refills on his medications.  He is interested in continuing to get treatment outpatient.  TOC was consulted for substance abuse resources as well as housing resources.   Aspiration pneumonia CT chest showed patchy ground glass opacities in the LLL concerning for infectious process. Suspect this is aspiration pneumonia in the setting of nausea and vomiting.  He was treated with 5 days of Unasyn  inpatient.   Mood disorder On seroquel  100 mg at bedtime, trazodone  100 mg at bedtime,  gabapentin  600 mg TID, cymbalta  60 mg daily, and propranolol  10 mg BID. - Psychiatry consulted by PCCM team. No medication changes suggested   Normocytic anemia Hb stable, consider outpatient workup.   Abd / Chest wall pain Favored to be MSK/secondary to gastritis.  Treated with Maalox, Voltaren  gel, tylenol .

## 2024-03-20 NOTE — Progress Notes (Addendum)
 HD#3 SUBJECTIVE:  Patient Summary: Xavier Owens is a 53 y.o. with a pertinent PMH of alcohol use disorder, who presented with nausea and vomiting and admitted for alcohol withdrawal c/b aspiration pneumonia.  In the ED, the patient had intractable nausea and vomiting with an EtOH level of >400 and lactic of 8.0. He was initially treated with CIWA with ativan  and unasyn  for aspiration pneumonia, however, his ativan  requirements increased, and he was transferred to the ICU for precedex . Precedex  was weaned off and the patient was started on a librium  taper and then was stable to transfer back to the floors.   Overnight Events: No acute events overnight  Interim History: The patient states that he did not sleep well. Notes that his nausea/vomiting have improved, and denies any diarrhea. He does not know when his last bowel movement was. He states that he has had chest pain since he got here that feels like something is sitting on his chest, but when he points to the area, he points to his epigastric region. Pain does not radiate.   OBJECTIVE:  Vital Signs: Vitals:   03/19/24 2310 03/20/24 0018 03/20/24 0418 03/20/24 0442  BP: 92/61 98/63 (!) 88/71 92/68  Pulse:   (!) 104 (!) 103  Resp:   15   Temp:  98.7 F (37.1 C) 99.1 F (37.3 C)   TempSrc:  Oral Axillary   SpO2:    100%  Weight:      Height:       Supplemental O2: Room Air SpO2: 100 %  Filed Weights   03/16/24 1513  Weight: 81.6 kg     Intake/Output Summary (Last 24 hours) at 03/20/2024 0603 Last data filed at 03/19/2024 1900 Gross per 24 hour  Intake 533 ml  Output 1150 ml  Net -617 ml   Net IO Since Admission: 623.69 mL [03/20/24 0603]  Physical Exam: General: Well developed, no acute distress CV: Tachycardic rate, regular rhythm. No murmurs Pulmonary: normal work of breathing on room air, diminished breath sounds in left lung base.  Abdominal: Soft, nontender, nondistended. Normal bowel sounds. Skin: Warm and  dry.  Neuro: A&Ox3. Moves all extremities spontaneously and answers questions appropriately.  Psych: Normal mood and affect   ASSESSMENT/PLAN:  Assessment: Principal Problem:   Nausea & vomiting Active Problems:   Lactic acidosis   Alcohol abuse   Tremor   Neuropathy   Mood disorder (HCC)   Delirium tremens (HCC)   Plan: Alcohol withdrawal Admitted to the ICU and required precedex  infusion for 1 day, but that was discontinued in the afternoon on 5/6. The patient was started on a librium  load and taper. - Continue librium  taper - Librium  25 mg q6h PRN for CIWA >10 - Hydroxyzine  25 mg q6h PRN - Zofran  4 mg q6h PRN - Loperamide  2-4 mg PRN - Continue folic acid , thiamine , and multivitamin  - TOC consulted for substance abuse resources  Tachycardia  HR in 100-110s, may be secondary to alcohol withdrawal. - 500 cc bolus ordered - Encourage PO intake  Aspiration pneumonia CT chest showed patchy ground glass opacities in the LLL concerning for infectious process. Suspect this is aspiration pneumonia in the setting of nausea and vomiting.  - Day 4/5 unasyn  - Duonebs q6h PRN - SLP   Mood disorder On seroquel  100 mg at bedtime, trazodone  100 mg at bedtime, gabapentin  600 mg TID, cymbalta  60 mg daily, and propranolol  10 mg BID. - Psychiatry consulted by PCCM team  Normocytic anemia Hb stable  at 12.0. No evidence of bleeding.   Electrolyte abnormalities Mg slightly low at 1.8. K improved to 3.5 and phos also improved to 3.7. - 2 g mag sulfate ordered  Best Practice: Diet: Soft IVF: Fluids: none VTE: enoxaparin  (LOVENOX ) injection 40 mg Start: 03/16/24 2200 Code: Full AB: uansyn Therapy Recs: Pending DISPO: Anticipated discharge in 1-2 days to Home pending  medical stability .  Signature: Tanicka Bisaillon, D.O.  Internal Medicine Resident, PGY-3 [facility]  Pager: [phone] 6:03 AM, 03/20/2024   Please contact the on call pager after 5 pm  and on weekends at [phone].

## 2024-03-20 NOTE — Plan of Care (Signed)
   Problem: Coping: Goal: Level of anxiety will decrease Outcome: Progressing

## 2024-03-20 NOTE — Consult Note (Addendum)
 [facility] Psychiatric Consult Initial  Patient Name: .Xavier Owens  MRN: [id number]  DOB: 06-27-1971  Consult Order details:  Orders (From admission, onward)     Start     Ordered   03/19/24 1440  IP CONSULT TO PSYCHIATRY       Comments: Just discharged w/ dx major depressive disorder. See note. Re-admitted w/ ETOH and then wd. Had recent medication adjustments in-pt. Reason for consult: further drug titration for MDD in case this is contributing to his ETOH dependence  Ordering Provider: Hadley Leu, NP  Provider:  (Not yet assigned)  Question Answer Comment  Location [facility]   Reason for Consult? medication adjustment      03/19/24 1441             Mode of Visit: In person    Psychiatry Consult Evaluation  Service Date: Mar 20, 2024 LOS:  LOS: 3 days  Chief Complaint:  "I have a hard time sleeping"   Primary Psychiatric Diagnoses  Major depressive disorder, severe 2.   Alcohol abuse/acute withdrawal 3.   Tobacco dependence 3.  Tremor    Assessment  Xavier Owens is a 53 y.o. male admitted: Medically on 03/16/2024  3:08 PM for alcohol intoxication. He carries the psychiatric diagnoses of Major Depressive Disorder, Tremor, and Alcohol abuse.  His current presentation of alcohol relapse and depressed mood meets criteria for alcohol withdrawal and major depressive disorder, recurrent, severe.  Patient's differential includes substance-induced mood disorder, and her depressive disorder.  Per collateral, patient has been more depressed, and with multiple deaths in the family, isolation, loss of job he is at increased risk for suicide.  There is uncertainty as to whether or not he still has access to weapons.  Patient stated he has been compliant with antidepressant medications.  He is not certain if he has had any benefit from Cymbalta , but states that Seroquel  has been helpful, as it allows him to sleep.  He however reports poor sleep last night.   Psychiatry will continue to reevaluate this patient  as he completes Librium  taper and CIWA protocol.  He would benefit from ongoing treatment with inpatient psychiatry stay.  On initial examination, patient is engaged and cooperative.  Denies his psychiatric symptoms.  He denies active SI, HI, or AVH.  Please see plan below for detailed recommendations.   Diagnoses:  Active Hospital problems: Principal Problem:   Nausea & vomiting Active Problems:   Lactic acidosis   Alcohol abuse   Tremor   Neuropathy   Mood disorder (HCC)   Delirium tremens (HCC)    Plan   ## Psychiatric Medication Recommendations:  Continue current medication.  Continue Cymbalta  60 mg daily for depression Will defer CIWA and Librium  taper for primary team.   ## Medical Decision Making Capacity: Not specifically addressed in this encounter   ## Tests/work-up: 03/16/2024:  CT CHEST, ABDOMEN, AND PELVIS WITH CONTRAST  1. Patchy ground-glass opacities in the left lower lobe worrisome for infectious/inflammatory process. 2. Fatty infiltration of the liver. 3. Colonic diverticulosis. 4. Mixed sclerotic and lucent bubbly appearance in the anterior left femoral head worrisome for avascular necrosis. No collapse of the cortex identified.   ## Disposition:-- pending.  Patient would benefit from substance use rehabilitation following discharge.  He is essentially homeless and unemployed with high risk factors for suicide despite denying suicidal ideation.  ## Behavioral / Environmental: - No specific recommendations at this time.     ## Safety and Observation Level:  -  Based on my clinical evaluation, I estimate the patient to be at high risk of self harm in the current setting. - At this time, we recommend  routine. This decision is based on my review of the chart including patient's history and current presentation, interview of the patient, mental status examination, and consideration of suicide risk  including evaluating suicidal ideation, plan, intent, suicidal or self-harm behaviors, risk factors, and protective factors. This judgment is based on our ability to directly address suicide risk, implement suicide prevention strategies, and develop a safety plan while the patient is in the clinical setting. Please contact our team if there is a concern that risk level has changed.  CSSR Risk Category:C-SSRS RISK CATEGORY: No Risk  Suicide Risk Assessment: Patient has following modifiable risk factors for suicide: access to guns and under treated depression , which we are addressing through education. Patient has following non-modifiable or demographic risk factors for suicide: male gender Patient has the following protective factors against suicide: Supportive family, Supportive friends, no history of suicide attempts, and no history of NSSIB  Thank you for this consult request. Recommendations have been communicated to the primary team.  We will continue to follow at this time.   Xavier Owens, Medical Student/ Xavier Ace, MD        History of Present Illness  Relevant Aspects of [facility] Course:  Admitted on 03/16/2024 for alcohol intoxication. Patient was started on a librium  load and taper to address his alcohol withdrawal. CT chest showed patchy ground glass opacities concerning for Aspiration pneumonia in the setting of his nausea and vomiting. A regimen of Unasyn  was started along with Duonebs every 6 hours as needed. A Speech Language Pathologist was consulted.   Patient Report:  The patient drank around a pint of vodka in his neighbor's yard and then subsequently passed out and hit his head. He does not recall the recent events leading up to his intoxication. Since being admitted, his nausea and vomiting have improved. He does however have constant "chest and abdominal discomfort" that make it hard for him to fall asleep.  He specifically denied any suicidal or  homicidal ideation, plan, or tent.  He denies any AVH and did not appear to be responding to internal stimuli.  Psych ROS:  Depression: Controlled  Anxiety:  Denies Mania (lifetime and current): Denies Psychosis: (lifetime and current): Denies  Collateral information:  Patient has provided consent to speak with his father Xavier Owens, Xavier Owens. [phone]) and his fiance Xavier Owens [phone]). Spoke with Xavier Owens, that has known patient since they were 12 years, and they have "been together" for the past 15 years. She could tell that he had been drinking for the past 3 days.  Xavier Owens has been worried about him being suicidal since patient's grandmother died in 2024-01-23.  He has had a lot of loss of relatives in the past year.  He has been staying in his great aunt's home, against her wishes.  The house does not have any power.  He will bathe at his friend, Brian's home. She believes his guns have been taken by the police.  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  He is essentially homeless. He is not working.  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  Review of Systems  Gastrointestinal:  Positive for abdominal pain, nausea and vomiting.  Psychiatric/Behavioral:  Positive for substance abuse (alcohol, tobacco). Negative for depression, hallucinations, memory loss and suicidal ideas. The patient has insomnia. The patient is not nervous/anxious.      Psychiatric and Social History  Psychiatric History:  Information collected from chart review and patient by Mara Seminole, Medical Student   Prev Dx/Sx: Major Depressive Disorder Current Psych Provider: Outpatient provider at [facility].  Home Meds (current): Librium , Cymbalta , Neurontin , Depade, Potassium Chloride  tablet, Inderal ,  Seroquel , Desyrel , Xarelto  Previous Med Trials: N/A Therapy: N/A  [facility] Prior Self Harm: Denies Prior Violence: Denies  Family Psych History: Denies Family Hx suicide: Denies  Social History:  Developmental Hx: Not accessed  Educational Hx: Not accessed  Occupational Hx: Works in Holiday representative. Legal Hx: Not accessed  Living Situation: Lives alone in a one story house. Relies on his father and/or friends for transportation.  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 Safety planning regarding weapons was completed by inpatient psychiatry team during his admission March 2025  Substance History Alcohol: Chronic drinker   Type of alcohol: Vodka Last Drink: 03/16/24 Number of drinks per day: A pint of vodka a week  History of alcohol withdrawal seizures: Not accessed History of DT's: 03/18/24 Tobacco: Smokes a pack of menthol every 4 days. Smoking since he was ~15 (38 years)  Illicit drugs: Denies Prescription drug abuse: Denies Rehab hx: Was at [facility] for Detox   Exam Findings  Physical Exam Vital Signs:  Temp:  [98.3 F (36.8 C)-99.1 F (37.3 C)] 99 F (37.2 C) (05/07 1215) Pulse Rate:  [87-110] 89 (05/07 1215) Resp:  [13-18] 16 (05/07 1215) BP: (88-114)/(61-94) 108/76 (05/07 1215) SpO2:  [94 %-100 %] 97 % (05/07 1215) Blood pressure 108/76, pulse 89, temperature 99 F (37.2 C), temperature source Oral, resp. rate 16, height 5\' 7"  (1.702 m), weight 81.6 kg, SpO2 97%. Body mass index is 28.18 kg/m.  Physical Exam Constitutional:      Appearance: He is obese. He is not ill-appearing.     Comments: tired  HENT:     Head: Normocephalic and atraumatic.     Nose: No congestion.  Cardiovascular:     Rate and Rhythm: Normal rate and regular rhythm.  Pulmonary:     Effort: Pulmonary effort is normal. No respiratory distress.  Skin:     General: Skin is warm and dry.  Neurological:     General: No focal deficit present.     Mental Status: He is alert and oriented to person, place, and time.     Mental Status Exam: General Appearance: Casual  Orientation:  Full (Time, Place, and Person)  Memory:  Immediate;   Good Recent;   Good Remote;   Good  Concentration:  Concentration: Good and Attention Span: Good  Recall:  Good  Attention  Good  Eye Contact:  Good  Speech:  Clear and Coherent  Language:  Good  Volume:  Decreased  Mood: "Fine, I could do better"  Affect:  Congruent  Thought Process:  Coherent, Goal Directed, and Linear  Thought Content:  Logical and Hallucinations: None  Suicidal Thoughts:  No  Homicidal Thoughts:  No  Judgement:  Good  Insight:  Good  Psychomotor Activity:  Normal  Akathisia:  Negative  Fund of Knowledge:  Good      Assets:  Communication Skills Desire for Improvement Housing Leisure Time Talents/Skills  Cognition:  WNL  ADL's:  Intact  AIMS (if indicated):        Other History   These have been pulled in through the EMR, reviewed, and updated if appropriate.  Family History:  The patient's Family history is unknown by patient.  Medical History: Past Medical History:  Diagnosis Date   Tremor     Surgical History: History reviewed. No pertinent surgical history.   Medications:   Current Facility-Administered Medications:    acetaminophen  (TYLENOL ) tablet 650 mg, 650 mg, Oral, Q6H PRN, Hongalgi, Anand D, MD, 650 mg at 03/20/24 0849   Ampicillin -Sulbactam (UNASYN ) 3 g in sodium chloride  0.9 % 100 mL IVPB, 3 g, Intravenous, Q6H, Bryk, Veronda P, RPH, Last Rate: 200 mL/hr at 03/20/24 1216, 3 g at 03/20/24 1216   chlordiazePOXIDE  (LIBRIUM ) capsule 25 mg, 25 mg, Oral, Q6H PRN, Babcock, Peter E, NP, 25 mg at 03/20/24 0016   [COMPLETED] chlordiazePOXIDE  (LIBRIUM ) capsule 25 mg, 25 mg, Oral, QID, 25 mg at 03/20/24 0849 **FOLLOWED BY** chlordiazePOXIDE  (LIBRIUM ) capsule 25  mg, 25 mg, Oral, TID, 25 mg at 03/20/24 1609 **FOLLOWED BY** [START ON 03/21/2024] chlordiazePOXIDE  (LIBRIUM ) capsule 25 mg, 25 mg, Oral, BH-qamhs **FOLLOWED BY** [START ON 03/22/2024] chlordiazePOXIDE  (LIBRIUM ) capsule 25 mg, 25 mg, Oral, Daily, Babcock, Peter E, NP   Chlorhexidine  Gluconate Cloth 2 % PADS 6 each, 6 each, Topical, Daily, Albustami, Omar M, MD, 6 each at 03/20/24 0849   DULoxetine  (CYMBALTA ) DR capsule 60 mg, 60 mg, Oral, Daily, Angelene Kelly, MD, 60 mg at 03/20/24 0847   enoxaparin  (LOVENOX ) injection 40 mg, 40 mg, Subcutaneous, Q24H, Angelene Kelly, MD, 40 mg at 03/19/24 2112   ferrous sulfate  tablet 325 mg, 325 mg, Oral, QHS, Xavier Ace, MD   folic acid  (FOLVITE ) tablet 1 mg, 1 mg, Oral, Daily, Kakrakandy, Arshad N, MD, 1 mg at 03/20/24 1610   gabapentin  (NEURONTIN ) capsule 600 mg, 600 mg, Oral, TID, Angelene Kelly, MD, 600 mg at 03/20/24 1609   hydrOXYzine  (ATARAX ) tablet 25 mg, 25 mg, Oral, Q6H PRN, Babcock, Peter E, NP, 25 mg at 03/19/24 2310   ipratropium-albuterol  (DUONEB) 0.5-2.5 (3) MG/3ML nebulizer solution 3 mL, 3 mL, Nebulization, Q6H PRN, Albustami, Omar M, MD   loperamide  (IMODIUM ) capsule 2-4 mg, 2-4 mg, Oral, PRN, Babcock, Peter E, NP   multivitamin with minerals tablet 1 tablet, 1 tablet, Oral, Daily, Angelene Kelly, MD, 1 tablet at 03/20/24 0847   multivitamin with minerals tablet 1 tablet, 1 tablet, Oral, Daily, Bethanie Brooking Bartley Borrow, MD   ondansetron  (ZOFRAN ) injection 4 mg, 4 mg, Intravenous, Q6H PRN, Hongalgi, Anand D, MD   ondansetron  (ZOFRAN -ODT) disintegrating tablet 4 mg, 4 mg, Oral, Q6H PRN, Babcock, Peter E, NP   pantoprazole  (PROTONIX ) EC tablet 40 mg, 40 mg, Oral, Daily, Hongalgi, Anand D, MD, 40 mg at 03/20/24 0849   propranolol  (INDERAL ) tablet 10 mg, 10 mg, Oral, BID, Angelene Kelly, MD, 10 mg at 03/17/24 2041   QUEtiapine  (SEROQUEL ) tablet 100 mg, 100 mg, Oral, QHS, Xavier Ace, MD   senna-docusate (Senokot-S)  tablet 2 tablet, 2 tablet, Oral, BID, Atway, Rayann N, DO   thiamine  (VITAMIN B1) tablet 100 mg, 100 mg, Oral, Daily, 100 mg at 03/20/24 0847 **OR** thiamine  (VITAMIN B1) injection 100 mg, 100 mg, Intravenous, Daily, Angelene Kelly, MD, 100 mg at 03/17/24 9604   traZODone  (DESYREL ) tablet 100 mg, 100 mg, Oral, QHS, Levert Ready  M, MD  Allergies: No Known Allergies  Xavier Owens, Medical Student   I have reviewed the note by medical student, Norton Beech, and discussed the case. I was present during assessment and addended the note with my findings. I obtained collateral from patient's fiance.  I am in agreement with the assessment and plan as outlined above.  Xavier Ace, MD

## 2024-03-20 NOTE — Progress Notes (Signed)
 eLink Physician-Brief Progress Note Patient Name: Xavier Owens DOB: Jun 17, 1971 MRN: [id number]   Date of Service  03/20/2024  HPI/Events of Note  Patient with sinus tachycardia, rate 120's to 130's, he is asleep.  eICU Interventions  Will give a 500 ml LR fluid bolus to address any possible component of volume depletion.        Attie Nawabi U Tora Prunty 03/20/2024, 3:19 AM

## 2024-03-20 NOTE — TOC Initial Note (Signed)
 Transition of Care [facility]) - Initial/Assessment Note    Patient Details  Name: Xavier Owens MRN: [id number] Date of Birth: 12-Sep-1971  Transition of Care [facility]) CM/SW Contact:    Xavier Bras, RN Phone Number: [phone] 03/20/2024, 8:04 AM  Clinical Narrative:                 TOC CM spoke to pt and father at bedside. Gave permission to speak to father. Pt lives at home alone. Was independent pta. He drives but does not have a car. His friend, Xavier Owens assist him to appts.   Will continue to follow for dc needs.   Expected Discharge Plan: Home/Self Care Barriers to Discharge: Continued Medical Work up   Patient Goals and CMS Choice Patient states their goals for this hospitalization and ongoing recovery are:: wants to recoverf          Expected Discharge Plan and Services   Discharge Planning Services: CM Consult   Living arrangements for the past 2 months: Single Family Home                                      Prior Living Arrangements/Services Living arrangements for the past 2 months: Single Family Home Lives with:: Self Patient language and need for interpreter reviewed:: Yes        Need for Family Participation in Patient Care: No (Comment) Care giver support system in place?: No (comment)   Criminal Activity/Legal Involvement Pertinent to Current Situation/Hospitalization: No - Comment as needed  Activities of Daily Living      Permission Sought/Granted Permission sought to share information with : Case Manager, Family Supports, PCP Permission granted to share information with : Yes, Verbal Permission Granted        Permission granted to share info w Relationship: father     Emotional Assessment Appearance:: Appears stated age Attitude/Demeanor/Rapport: Engaged Affect (typically observed): Accepting Orientation: : Oriented to Self, Oriented to Place, Oriented to Situation, Oriented to  Time   Psych Involvement: No  (comment)  Admission diagnosis:  Dehydration [E86.0] Nausea & vomiting [R11.2] 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] Acute lactic acidosis [E87.21] Patient Active Problem List   Diagnosis Date Noted   Delirium tremens (HCC) 03/18/2024   Nausea & vomiting 03/16/2024   Lactic acidosis 03/16/2024   Alcohol abuse 03/16/2024   Tremor 03/16/2024   Neuropathy 03/16/2024   Mood disorder (HCC) 03/16/2024   PCP:  Xavier Owens, No Pharmacy:   [id number] - [place] - [address] [place]) [place] [address] Phone: [phone] Fax: [phone]     Social Drivers of Health (SDOH) Social History: SDOH Screenings   Food Insecurity: No Food Insecurity (03/17/2024)  Housing: Low Risk  (03/17/2024)  Transportation Needs: No Transportation Needs (03/17/2024)  Utilities: Not At Risk (03/17/2024)   SDOH Interventions:     Readmission Risk Interventions     No data to display

## 2024-03-20 NOTE — Progress Notes (Signed)
 SLP Cancellation Note  Patient Details Name: Xavier Owens MRN: [id number] DOB: [birth year]   Cancelled treatment:       Reason Eval/Treat Not Completed: SLP screened, no needs identified, will sign off. Pt tolerating diet, no difficulty swallowing noted by staff. Will defer eval   Grettel Rames, Hardin Leys 03/20/2024, 2:40 PM

## 2024-03-20 NOTE — Progress Notes (Signed)
 OT Cancellation Note  Patient Details Name: Xavier Owens MRN: [id number] DOB: 1970-12-14   Cancelled Treatment:    Reason Eval/Treat Not Completed: Other (comment). Attempted OT Evaluation 2nd time in PM and pt just starting lunch. Pt noted sitting EOB well and using feeding utensils without difficulty. Pt asked for OT later in evening or early tomorrow morning. Will continue attempts.   Asher Blade 03/20/2024, 2:20 PM

## 2024-03-20 NOTE — Evaluation (Signed)
 Physical Therapy Evaluation Patient Details Name: Xavier Owens MRN: [id number] DOB: 1971-05-14 Today's Date: 03/20/2024  History of Present Illness  Xavier Owens is a 53 y.o. male admitted 03/16/24 with ETOH withdrawal c/b aspiration pneumonia. He recently d/c'd home from  behavioral health and was found intoxicated in a neighbor's yard. Pt brought back to the ED intoxicated with intractable nausea and vomiting, ETOH >400 and lactic acid 8.0. He was admitted and placed on CIWA and treated with Ativan  and Unasyn  for aspiration. His Ativan  requirements and CIWA score uptrended and he required 4 point restraints so PCCM consulted for possible precedex  infusion. PMH significant for ETOH abuse, tremor, and neuropathy.   Clinical Impression  Pt admitted with above diagnosis. PTA, pt was independent with functional mobility, ADLs, and IADLs. He doesn't drive and relies of his dad and/or friends for transportation. Pt lives alone in a one story house with 3 STE.  Pt currently with functional limitations due to the deficits listed below (see PT Problem List). He required CGA for safety with functional mobility. Unable to advance gait secondary to symptomatic orthostatic hypotension. Pt will benefit from acute skilled PT to increase his independence and safety with mobility to allow discharge home with HHPT.  Blood Pressure Seated:  95/67 (77) Standing: 75/66 (71) Seated: 82/61 (69)     If plan is discharge home, recommend the following: A little help with walking and/or transfers;Assist for transportation;Help with stairs or ramp for entrance;Assistance with cooking/housework   Can travel by private vehicle        Equipment Recommendations None recommended by PT  Recommendations for Other Services       Functional Status Assessment Patient has had a recent decline in their functional status and demonstrates the ability to make significant improvements in function in a reasonable and  predictable amount of time.     Precautions / Restrictions Precautions Precautions: None Restrictions Weight Bearing Restrictions Per Provider Order: No      Mobility  Bed Mobility Overal bed mobility: Needs Assistance Bed Mobility: Rolling, Sidelying to Sit Rolling: Contact guard assist Sidelying to sit: Contact guard assist       General bed mobility comments: Pt sat up on L side of bed with increased time. VC for sequencing. Educated pt on log roll technique given his abdominal pain. Assist to elevate trunk. Pt scooted fwd with BUE support.    Transfers Overall transfer level: Needs assistance Equipment used: None Transfers: Sit to/from Stand Sit to Stand: Contact guard assist           General transfer comment: Pt stood from lowest bed height by pushing up with BUE to power up into standing. Increased time to reach erect posture and CGA for safety.    Ambulation/Gait Ambulation/Gait assistance: Contact guard assist Gait Distance (Feet): 2 Feet Assistive device: None         General Gait Details: Pt engaged in a couple of side steps to the left in order to get higher in the bed. CGA for safety as pt was unsteady/shaky. Deferred further distance secondary to symptomatic orthostatic hypotension.  Stairs            Wheelchair Mobility     Tilt Bed    Modified Rankin (Stroke Patients Only)       Balance Overall balance assessment: Needs assistance, History of Falls Sitting-balance support: Bilateral upper extremity supported, Feet supported Sitting balance-Leahy Scale: Fair Sitting balance - Comments: Pt sat EOB with supervision.   Standing balance support:  No upper extremity supported, During functional activity Standing balance-Leahy Scale: Fair Standing balance comment: Pt stood and side stepped with CGA for safety. Unsteady but no LOB.                             Pertinent Vitals/Pain Pain Assessment Pain Assessment: 0-10 Pain  Score: 10-Worst pain ever Pain Location: Abdomen Pain Descriptors / Indicators: Pressure, Discomfort Pain Intervention(s): Patient requesting pain meds-RN notified, Limited activity within patient's tolerance, Monitored during session    Home Living Family/patient expects to be discharged to:: Private residence Living Arrangements: Alone Available Help at Discharge: Family;Friend(s);Available PRN/intermittently Type of Home: House Home Access: Stairs to enter Entrance Stairs-Rails: None Entrance Stairs-Number of Steps: 3   Home Layout: One level Home Equipment: None      Prior Function Prior Level of Function : Independent/Modified Independent;History of Falls (last six months)             Mobility Comments: Ambulates without AD. Pt reports at least 3 falls in the last 26mo. ADLs Comments: Indep with ADLs/IADLs. Relies on his dad or a friend for transportation.     Extremity/Trunk Assessment   Upper Extremity Assessment Upper Extremity Assessment: Defer to OT evaluation    Lower Extremity Assessment Lower Extremity Assessment: Generalized weakness    Cervical / Trunk Assessment Cervical / Trunk Assessment: Normal  Communication   Communication Communication: No apparent difficulties    Cognition Arousal: Alert Behavior During Therapy: WFL for tasks assessed/performed   PT - Cognitive impairments: Awareness, Orientation   Orientation impairments: Situation                   PT - Cognition Comments: Pt A,Ox3. He is unaware of the reason for his admittance, believes he had a seizure. Following commands: Impaired Following commands impaired: Follows one step commands with increased time, Follows multi-step commands with increased time     Cueing Cueing Techniques: Verbal cues, Gestural cues     General Comments General comments (skin integrity, edema, etc.): HR at rest 100-110bpm, with activity 115-125bpm. BP: seated 95/67 (77), standing 75/66 (71),  seated 82/61 (69). Pt c/o lightheadedness which improved slightly with seated rest.    Exercises     Assessment/Plan    PT Assessment Patient needs continued PT services  PT Problem List Decreased strength;Decreased activity tolerance;Decreased balance;Decreased mobility;Decreased safety awareness;Cardiopulmonary status limiting activity       PT Treatment Interventions Gait training;Stair training;DME instruction;Functional mobility training;Therapeutic activities;Therapeutic exercise;Balance training;Patient/family education    PT Goals (Current goals can be found in the Care Plan section)  Acute Rehab PT Goals Patient Stated Goal: Return Home PT Goal Formulation: With patient Time For Goal Achievement: 04/03/24 Potential to Achieve Goals: Good    Frequency Min 2X/week     Co-evaluation               AM-PAC PT "6 Clicks" Mobility  Outcome Measure Help needed turning from your back to your side while in a flat bed without using bedrails?: A Little Help needed moving from lying on your back to sitting on the side of a flat bed without using bedrails?: A Little Help needed moving to and from a bed to a chair (including a wheelchair)?: A Little Help needed standing up from a chair using your arms (e.g., wheelchair or bedside chair)?: A Little Help needed to walk in hospital room?: A Lot Help needed climbing 3-5 steps with a railing? :  A Lot 6 Click Score: 16    End of Session Equipment Utilized During Treatment: Gait belt Activity Tolerance: Treatment limited secondary to medical complications (Comment) (symptomatic orthostatic hypotension) Patient left: in bed;with bed alarm set;with nursing/sitter in room Nurse Communication: Mobility status;Patient requests pain meds;Other (comment) (symptomatic orthostatic hypotension) PT Visit Diagnosis: Muscle weakness (generalized) (M62.81);Unsteadiness on feet (R26.81);Difficulty in walking, not elsewhere classified (R26.2)     Time: 0712-0735 PT Time Calculation (min) (ACUTE ONLY): 23 min   Charges:   PT Evaluation $PT Eval Moderate Complexity: 1 Mod   PT General Charges $$ ACUTE PT VISIT: 1 Visit         Glenford Lanes, PT, DPT Acute Rehabilitation Services Office: [phone] Secure Chat Preferred  [name] 03/20/2024, 7:54 AM

## 2024-03-20 NOTE — Progress Notes (Signed)
 OT Cancellation Note  Patient Details Name: Xavier Owens MRN: [id number] DOB: 1970/12/10   Cancelled Treatment:    Reason Eval/Treat Not Completed: Fatigue/lethargy limiting ability to participate: Attempted early due to Imminent discharge orders however pt politely declined due to fatigue and having no sleep last night. Pt agreeable for OT to try in afternoon.   Asher Blade 03/20/2024, 8:38 AM

## 2024-03-21 ENCOUNTER — Encounter (HOSPITAL_COMMUNITY): Payer: Self-pay | Admitting: Internal Medicine

## 2024-03-21 DIAGNOSIS — E8889 Other specified metabolic disorders: Secondary | ICD-10-CM

## 2024-03-21 DIAGNOSIS — R000 Tachycardia, unspecified: Secondary | ICD-10-CM | POA: Diagnosis not present

## 2024-03-21 DIAGNOSIS — E860 Dehydration: Secondary | ICD-10-CM

## 2024-03-21 DIAGNOSIS — F10931 Alcohol use, unspecified with withdrawal delirium: Secondary | ICD-10-CM | POA: Diagnosis not present

## 2024-03-21 DIAGNOSIS — R112 Nausea with vomiting, unspecified: Secondary | ICD-10-CM | POA: Diagnosis not present

## 2024-03-21 DIAGNOSIS — J690 Pneumonitis due to inhalation of food and vomit: Secondary | ICD-10-CM | POA: Diagnosis not present

## 2024-03-21 DIAGNOSIS — E8721 Acute metabolic acidosis: Principal | ICD-10-CM

## 2024-03-21 DIAGNOSIS — R079 Chest pain, unspecified: Secondary | ICD-10-CM

## 2024-03-21 LAB — CBC
HCT: 37 % — ABNORMAL LOW (ref 39.0–52.0)
Hemoglobin: 12.1 g/dL — ABNORMAL LOW (ref 13.0–17.0)
MCH: 28.9 pg (ref 26.0–34.0)
MCHC: 32.7 g/dL (ref 30.0–36.0)
MCV: 88.5 fL (ref 80.0–100.0)
Platelets: 251 10*3/uL (ref 150–400)
RBC: 4.18 MIL/uL — ABNORMAL LOW (ref 4.22–5.81)
RDW: 13.8 % (ref 11.5–15.5)
WBC: 6.2 10*3/uL (ref 4.0–10.5)
nRBC: 0 % (ref 0.0–0.2)

## 2024-03-21 LAB — BASIC METABOLIC PANEL WITH GFR
Anion gap: 13 (ref 5–15)
BUN: 7 mg/dL (ref 6–20)
CO2: 20 mmol/L — ABNORMAL LOW (ref 22–32)
Calcium: 9.7 mg/dL (ref 8.9–10.3)
Chloride: 103 mmol/L (ref 98–111)
Creatinine, Ser: 0.81 mg/dL (ref 0.61–1.24)
GFR, Estimated: >60 mL/min (ref >60)
Glucose, Bld: 94 mg/dL (ref 70–99)
Potassium: 3.7 mmol/L (ref 3.5–5.1)
Sodium: 136 mmol/L (ref 135–145)

## 2024-03-21 LAB — MAGNESIUM: Magnesium: 1.7 mg/dL (ref 1.7–2.4)

## 2024-03-21 MED ORDER — RIVAROXABAN 10 MG PO TABS
10.0000 mg | ORAL_TABLET | Freq: Every day | ORAL | Status: DC
  Administered 2024-03-21 – 2024-03-22 (×2): 10 mg via ORAL
  Filled 2024-03-21 (×2): qty 1

## 2024-03-21 MED ORDER — ALUM & MAG HYDROXIDE-SIMETH 200-200-20 MG/5ML PO SUSP
15.0000 mL | Freq: Once | ORAL | Status: AC
  Administered 2024-03-21: 15 mL via ORAL
  Filled 2024-03-21: qty 30

## 2024-03-21 MED ORDER — CHLORDIAZEPOXIDE HCL 25 MG PO CAPS: 25.0000 mg | ORAL_CAPSULE | Freq: Every day | ORAL | Status: DC

## 2024-03-21 MED ORDER — CHLORDIAZEPOXIDE HCL 25 MG PO CAPS
25.0000 mg | ORAL_CAPSULE | ORAL | Status: DC
  Administered 2024-03-21 – 2024-03-22 (×2): 25 mg via ORAL
  Filled 2024-03-21 (×2): qty 1

## 2024-03-21 MED ORDER — DICLOFENAC SODIUM 1 % EX GEL
2.0000 g | Freq: Four times a day (QID) | CUTANEOUS | Status: DC
  Administered 2024-03-21 – 2024-03-22 (×4): 2 g via TOPICAL
  Filled 2024-03-21: qty 100

## 2024-03-21 MED ORDER — MAGNESIUM SULFATE 2 GM/50ML IV SOLN
2.0000 g | Freq: Once | INTRAVENOUS | Status: AC
  Administered 2024-03-21: 2 g via INTRAVENOUS

## 2024-03-21 NOTE — Consult Note (Addendum)
 [facility] Psychiatric Consult Follow-up  Patient Name: .Xavier Owens  MRN: [id number]  DOB: 1971-06-23  Consult Order details:  Orders (From admission, onward)     Start     Ordered   03/19/24 1440  IP CONSULT TO PSYCHIATRY       Comments: Just discharged w/ dx major depressive disorder. See note. Re-admitted w/ ETOH and then wd. Had recent medication adjustments in-pt. Reason for consult: further drug titration for MDD in case this is contributing to his ETOH dependence  Ordering Provider: Hadley Leu, NP  Provider:  (Not yet assigned)  Question Answer Comment  Location [facility]   Reason for Consult? medication adjustment      03/19/24 1441             Mode of Visit: In person    Psychiatry Consult Evaluation  Service Date: Mar 21, 2024 LOS:  LOS: 4 days  Chief Complaint "Im really depressed"   Primary Psychiatric Diagnoses  Major depressive disorder, moderate Alcohol abuse/acute withdrawal Tobacco dependence Tremor   Assessment  Rubens Kuznetsov is a 53 y.o. male admitted: Medically on  03/16/2024  3:08 PM for alcohol intoxication. He carries the psychiatric diagnoses of Major Depressive Disorder, Tremor, and Alcohol abuse   03/20/24 His current presentation of alcohol relapse and depressed mood meets criteria for alcohol withdrawal.  Patient's differential includes substance-induced mood disorder, and her depressive disorder.  Per collateral, patient has been more depressed, and with multiple deaths in the family, isolation, loss of job he is at increased risk for suicide.  There is uncertainty as to whether or not he still has access to weapons.  Patient stated he has been compliant with antidepressant medications.  He is not certain if he has had any benefit from Cymbalta , but states that Seroquel  has been helpful allows him to sleep.  He however reports poor sleep last night.  Psychiatry will continue to reevaluate this patient completes  Librium  taper and CIWA protocol.  He would benefit from ongoing treatment to be determined either inpatient psychiatry, comical dependency intensive outpatient program, versus outpatient or residential substance use rehabilitation. On initial examination, patient is engaged and cooperative.  Denies his psychiatric symptoms.  He denies SI, HI, or AVH.   Please see plan below for detailed recommendations.   Diagnoses:  Active Hospital problems: Principal Problem:   Nausea & vomiting Active Problems:   Lactic acidosis   Alcohol abuse   Tremor   Neuropathy   Mood disorder (HCC)   Delirium tremens (HCC)   Alcoholic ketosis (HCC)   Dehydration   Acute lactic acidosis    Plan   ## Psychiatric Medication Recommendations:  Continue current medication. Will need inpatient psychiatric admission. Will defer CIWA and Librium  taper for primary team.   He will need a safety plan prior to discharge to ensure that guns are secure.   ## Medical Decision Making Capacity: Not specifically addressed in this encounter  ## Further Work-up:  -- Check in with social work to make sure there is a plan for housing following discharge.    ## Disposition:-- pending. Patient would benefit from substance use rehabilitation following discharge.  He is essentially homeless and unemployed with high risk factors for suicide despite denying suicidal ideation.     ## Behavioral / Environmental: - No specific recommendations at this time.     ## Safety and Observation Level:  - Based on my clinical evaluation, I estimate the patient to be at moderate  risk of self harm in the current setting. - At this time, we recommend  routine observation. This decision is based on my review of the chart including patient's history and current presentation, interview of the patient, mental status examination, and consideration of suicide risk including evaluating suicidal ideation, plan, intent, suicidal or self-harm behaviors,  risk factors, and protective factors. This judgment is based on our ability to directly address suicide risk, implement suicide prevention strategies, and develop a safety plan while the patient is in the clinical setting. Please contact our team if there is a concern that risk level has changed.  CSSR Risk Category:C-SSRS RISK CATEGORY: No Risk  Suicide Risk Assessment: Patient has following modifiable risk factors for suicide: access to guns and under treated depression , which we are addressing through education. Patient has following non-modifiable or demographic risk factors for suicide: male gender Patient has the following protective factors against suicide: Supportive family, Supportive friends, no history of suicide attempts, and no history of NSSIB  Thank you for this consult request. Recommendations have been communicated to the primary team.  We will monitor at this time.   Prudencio Browner Cecillia Cogan, Medical Student       History of Present Illness  Relevant Aspects of [facility] Course:  Admitted on 03/16/2024 for alcohol intoxication. Patient was started on a librium  load and taper to address his alcohol withdrawal. CT chest showed patchy ground glass opacities concerning for Aspiration pneumonia in the setting of his nausea and vomiting. A regimen of Unasyn  was started along with Duonebs every 6 hours as needed. A Speech Language Pathologist was consulted.   Patient Report:  03/20/24 On 03/16/24 , the patient drank around a pint of vodka in his neighbor's yard and then subsequently passed out and hit his head. He does not recall the recent events leading up to his intoxication. Since being admitted, his nausea and vomiting have improved. He does however have constant "chest and abdominal discomfort" that make it hard for him to fall asleep.  He specifically denied any suicidal or homicidal ideation, plan, or tent.  He denies any AVH and did not appear to be responding to internal  stimuli.  03/21/24 The patient is feeling much better. He slept well last night and reports the med adjustments have been favorable. He is "feeling 100" physically compared to yesterday. He talked to his aunt today and he is open to alcohol cessation. His appetite is much improved. However he still feels slight pain in his chest and abdominal area. He is open to talking to a therapist/psychiatrist about his grief involving the death of his grandmother.   Psych ROS:  Depression: Medicated but still fluctuates  Anxiety:  Denies Mania (lifetime and current): Denies Psychosis: (lifetime and current): Denies  Collateral information:  03/20/24 Patient has provided consent to speak with his father Aydon, Devaney. [phone]) and his fiance Trevor Fudge [phone]). Spoke with Trevor Fudge, that has known patient since they were 12 years, and they have "been together" for the past 15 years. She could tell that he had been drinking for the past 3 days.  Trevor Fudge has been worried about him being suicidal since patient's grandmother died in 01-14-2024.  He has had a lot of loss of relatives in the past year.  He has been staying in his great aunt's home, against her wishes.  The house does not have any power.  He will bathe at his friend, Brian's home. She believes his guns have been  taken by the police.  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  He is essentially homeless. He is not working.  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  Review of Systems  Constitutional:  Negative for chills, fever, malaise/fatigue and weight loss.  Cardiovascular:  Positive for chest pain.  Gastrointestinal:  Positive for abdominal pain.  Psychiatric/Behavioral:  Positive for depression and memory loss.      Psychiatric and  Social History  Psychiatric History:  Information collected from [facility] Cecillia Cogan, Medical Student  Prev Dx/Sx: Major Depressive Disorder Current Psych Provider: Outpatient provider at [facility].  Home Meds (current): Librium , Cymbalta , Neurontin , Depade, Potassium Chloride  tablet, Inderal , Seroquel , Desyrel , Xarelto  Previous Med Trials: N/A Therapy: N/A   [facility] Prior Self Harm: Denies Prior Violence: Denies   Family Psych History: Denies Family Hx suicide: Denies   Social History:  Developmental Hx: Not accessed  Educational Hx: Not accessed  Occupational Hx: Works in Holiday representative. Legal Hx: Not accessed  Living Situation: Lives alone in a one story house. Relies on his father and/or friends for transportation.  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 Safety planning regarding weapons was completed by inpatient psychiatry team during his admission March 2025   Substance History Alcohol: Chronic drinker   Type of alcohol: Vodka Last Drink: 03/16/24 Number of drinks per day: A pint of vodka a week  History of alcohol withdrawal seizures: 2 reportedly back to back in February  History of DT's: 03/18/24 Tobacco: Smokes a pack of menthol every 4 days. Smoking since he was ~15 (38 years)  Illicit drugs: Denies Prescription drug abuse: Denies Rehab hx: Was at [facility] for Detox   Exam Findings  Physical Exam: Vital Signs:  Temp:  [97.8 F (36.6 C)-98.3 F (36.8 C)] 97.8 F (36.6 C) (05/08 0956) Pulse Rate:  [90-106] 103 (05/08 0956) Resp:  [18] 18 (05/08 0956) BP: (94-123)/(56-82) 101/81 (05/08 1138) SpO2:  [96 %-100 %] 100 % (05/08 0042) Blood pressure 101/81, pulse (!) 103, temperature 97.8 F (36.6 C), temperature source Oral, resp. rate 18, height 5\' 7"  (1.702 m), weight 81.6 kg, SpO2 100%. Body mass index is 28.18 kg/m.  Physical  Exam Constitutional:      Appearance: Normal appearance.  Skin:    General: Skin is warm and dry.  Neurological:     Mental Status: He is alert.  Psychiatric:        Attention and Perception: Attention and perception normal.        Mood and Affect: Mood is depressed.     Comments: Pt is looking forward to being discharged but displays sadness.      Mental Status Exam: General Appearance: Casual  Orientation:  Full (Time, Place, and Person)  Memory:  Immediate;   Good Recent;   Good Remote;   Fair Pt reports that after having back to back seizures in February, his mind has been a little "foggy."   Concentration:  Concentration: Good and Attention Span: Good  Recall:  Good  Attention  Good  Eye Contact:  Good  Speech:  Patient reports that he knows "what I want to say, but I have a hard time getting it  out." However his speech seemed coherent and clear to me.   Language:  Good  Volume:  Normal  Mood: "I am feeling depressed."  Affect:  Appropriate and Congruent  Thought Process:  Coherent and Linear  Thought Content:  Logical and Hallucinations: None  Suicidal Thoughts:  No  Homicidal Thoughts:  No  Judgement:  Good  Insight:  Good  Psychomotor Activity:  Negative  Akathisia:  Negative  Fund of Knowledge:  Good      Assets:  Communication Skills Desire for Improvement  Cognition:  WNL  ADL's:  Intact  AIMS (if indicated):        Other History   These have been pulled in through the EMR, reviewed, and updated if appropriate.  Family History:  The patient's Family history is unknown by patient.  Medical History: Past Medical History:  Diagnosis Date   Tremor     Surgical History: History reviewed. No pertinent surgical history.   Medications:   Current Facility-Administered Medications:    acetaminophen  (TYLENOL ) tablet 650 mg, 650 mg, Oral, Q6H PRN, Hongalgi, Anand D, MD, 650 mg at 03/20/24 0849   Ampicillin -Sulbactam (UNASYN ) 3 g in sodium chloride  0.9  % 100 mL IVPB, 3 g, Intravenous, Q6H, Bryk, Veronda P, RPH, Last Rate: 200 mL/hr at 03/21/24 1140, 3 g at 03/21/24 1140   chlordiazePOXIDE  (LIBRIUM ) capsule 25 mg, 25 mg, Oral, Q6H PRN, Babcock, Peter E, NP, 25 mg at 03/20/24 0016   [COMPLETED] chlordiazePOXIDE  (LIBRIUM ) capsule 25 mg, 25 mg, Oral, QID, 25 mg at 03/20/24 0849 **FOLLOWED BY** [COMPLETED] chlordiazePOXIDE  (LIBRIUM ) capsule 25 mg, 25 mg, Oral, TID, 25 mg at 03/21/24 1135 **FOLLOWED BY** chlordiazePOXIDE  (LIBRIUM ) capsule 25 mg, 25 mg, Oral, BH-qamhs **FOLLOWED BY** [START ON 03/22/2024] chlordiazePOXIDE  (LIBRIUM ) capsule 25 mg, 25 mg, Oral, Daily, Babcock, Peter E, NP   Chlorhexidine  Gluconate Cloth 2 % PADS 6 each, 6 each, Topical, Daily, Albustami, Omar M, MD, 6 each at 03/21/24 1136   diclofenac  Sodium (VOLTAREN ) 1 % topical gel 2 g, 2 g, Topical, QID, Atway, Rayann N, DO, 2 g at 03/21/24 1139   DULoxetine  (CYMBALTA ) DR capsule 60 mg, 60 mg, Oral, Daily, Angelene Kelly, MD, 60 mg at 03/21/24 1135   enoxaparin  (LOVENOX ) injection 40 mg, 40 mg, Subcutaneous, Q24H, Angelene Kelly, MD, 40 mg at 03/20/24 2103   ferrous sulfate  tablet 325 mg, 325 mg, Oral, QHS, Mervyn Ace, MD, 325 mg at 03/20/24 2102   folic acid  (FOLVITE ) tablet 1 mg, 1 mg, Oral, Daily, Kakrakandy, Arshad N, MD, 1 mg at 03/21/24 1135   gabapentin  (NEURONTIN ) capsule 600 mg, 600 mg, Oral, TID, Angelene Kelly, MD, 600 mg at 03/21/24 1135   hydrOXYzine  (ATARAX ) tablet 25 mg, 25 mg, Oral, Q6H PRN, Babcock, Peter E, NP, 25 mg at 03/19/24 2310   ipratropium-albuterol  (DUONEB) 0.5-2.5 (3) MG/3ML nebulizer solution 3 mL, 3 mL, Nebulization, Q6H PRN, Albustami, Omar M, MD   loperamide  (IMODIUM ) capsule 2-4 mg, 2-4 mg, Oral, PRN, Babcock, Peter E, NP   multivitamin with minerals tablet 1 tablet, 1 tablet, Oral, Daily, Angelene Kelly, MD, 1 tablet at 03/20/24 0847   multivitamin with minerals tablet 1 tablet, 1 tablet, Oral, Daily, Mervyn Ace, MD, 1  tablet at 03/21/24 1135   ondansetron  (ZOFRAN ) injection 4 mg, 4 mg, Intravenous, Q6H PRN, Hongalgi, Anand D, MD   ondansetron  (ZOFRAN -ODT) disintegrating tablet 4 mg, 4 mg, Oral, Q6H PRN, Babcock, Peter E, NP   pantoprazole  (PROTONIX ) EC tablet 40  mg, 40 mg, Oral, Daily, Hongalgi, Anand D, MD, 40 mg at 03/21/24 1135   propranolol  (INDERAL ) tablet 10 mg, 10 mg, Oral, BID, Kakrakandy, Arshad N, MD, 10 mg at 03/17/24 2041   QUEtiapine  (SEROQUEL ) tablet 100 mg, 100 mg, Oral, QHS, Mervyn Ace, MD, 100 mg at 03/20/24 2104   senna-docusate (Senokot-S) tablet 2 tablet, 2 tablet, Oral, BID, Atway, Rayann N, DO, 2 tablet at 03/21/24 1135   thiamine  (VITAMIN B1) tablet 100 mg, 100 mg, Oral, Daily, 100 mg at 03/21/24 1135 **OR** thiamine  (VITAMIN B1) injection 100 mg, 100 mg, Intravenous, Daily, Angelene Kelly, MD, 100 mg at 03/17/24 1610   traZODone  (DESYREL ) tablet 100 mg, 100 mg, Oral, QHS, Mervyn Ace, MD, 100 mg at 03/20/24 2103  Allergies: No Known Allergies  Prudencio Browner Cecillia Cogan, Medical Student

## 2024-03-21 NOTE — TOC Progression Note (Signed)
 Transition of Care [facility]) - Progression Note    Patient Details  Name: Xavier Owens MRN: [id number] Date of Birth: [birth year]  Transition of [facility] CM/SW Contact  Graves-Bigelow, Jari Merles, RN Phone Number: 03/21/2024, 4:12 PM  Clinical Narrative: Case Manager spoke with patient regarding disposition needs. Patient states he has a PCP: Nguyen. PT/OT states no recommendations at this time. Patient reports that he cannot return to his home address on [address] due to renovations. Case Manager did ask if the patient can stay with family or friends and he states he has somewhere to go. Patient declined shelter resources. CSW will provide additional housing resources for the patient as well. Patient has Medicaid and medications will cost no more than $4,00 each. Case Manager will continue to follow for additional needs.     Expected Discharge Plan: Home/Self Care Barriers to Discharge: Continued Medical Work up  Expected Discharge Plan and Services   Discharge Planning Services: CM Consult   Living arrangements for the past 2 months: Single Family Home  Social Determinants of Health (SDOH) Interventions SDOH Screenings   Food Insecurity: No Food Insecurity (03/17/2024)  Housing: Low Risk  (03/17/2024)  Transportation Needs: No Transportation Needs (03/17/2024)  Utilities: Not At Risk (03/17/2024)    Readmission Risk Interventions     No data to display

## 2024-03-21 NOTE — Progress Notes (Signed)
 Physical Therapy Treatment Patient Details Name: Xavier Owens MRN: [id number] DOB: Mar 09, 1971 Today's Date: 03/21/2024   History of Present Illness Xavier Owens is a 53 y.o. male admitted 03/16/24 with ETOH withdrawal c/b aspiration pneumonia. He recently d/c'd home from  behavioral health and was found intoxicated in a neighbor's yard. Pt brought back to the ED intoxicated with intractable nausea and vomiting, ETOH >400 and lactic acid 8.0. He was admitted and placed on CIWA and treated with Ativan  and Unasyn  for aspiration. His Ativan  requirements and CIWA score uptrended and he required 4 point restraints so PCCM consulted for possible precedex  infusion. PMH significant for ETOH abuse, tremor, and neuropathy.    PT Comments  Pt completed all functional mobility independently. Pt ambulated ~514ft and ascended/descended a total of 8 steps with a reciprocal gait pattern and no UE support. He denied dizziness/lightheadedness and was steady throughout session. Patient feels ready and safe for discharge home. I have answered all his questions related to mobility. Pt is back to his baseline function, no follow-up PT needs.      If plan is discharge home, recommend the following:     Can travel by private vehicle        Equipment Recommendations  None recommended by PT    Recommendations for Other Services       Precautions / Restrictions Precautions Precautions: Fall Recall of Precautions/Restrictions: Intact Restrictions Weight Bearing Restrictions Per Provider Order: No     Mobility  Bed Mobility Overal bed mobility: Independent             General bed mobility comments: Pt sat up from a flat bed without use of bedrail.    Transfers Overall transfer level: Independent Equipment used: None               General transfer comment: Pt stood from lowest bed height without pushing up with BUE support. Good eccentric control with sitting.     Ambulation/Gait Ambulation/Gait assistance: Independent Gait Distance (Feet): 500 Feet Assistive device: None Gait Pattern/deviations: WFL(Within Functional Limits), Step-through pattern Gait velocity: WFL Gait velocity interpretation: 1.31 - 2.62 ft/sec, indicative of limited community ambulator   General Gait Details: Pt ambulated with a reciprocal gait pattern, even weight shift, good foot clearence. He navigated obstacles in room/hallway well. No LOB. VC to increase safety awareness with IV pole regarding which direction to turn.   Stairs Stairs: Yes Stairs assistance: Independent Stair Management: No rails, Alternating pattern, Forwards Number of Stairs: 4 (x2) General stair comments: Pt ascended/descended with a reciprocal gait pattern and no UE support. VC to increase safety awareness with IV regarding which direction to turn. No LOB   Wheelchair Mobility     Tilt Bed    Modified Rankin (Stroke Patients Only)       Balance Overall balance assessment: No apparent balance deficits (not formally assessed)                                          Communication Communication Communication: No apparent difficulties  Cognition Arousal: Alert Behavior During Therapy: WFL for tasks assessed/performed   PT - Cognitive impairments: No apparent impairments                         Following commands: Intact      Cueing Cueing Techniques: Verbal cues  Exercises  General Comments General comments (skin integrity, edema, etc.): VSS on RA. Pt denied lightheadedness and dizziness.      Pertinent Vitals/Pain Pain Assessment Pain Assessment: No/denies pain    Home Living Family/patient expects to be discharged to:: Private residence Living Arrangements: Alone Available Help at Discharge: Family;Friend(s);Available PRN/intermittently Type of Home: House Home Access: Stairs to enter Entrance Stairs-Rails: None Entrance Stairs-Number  of Steps: 2   Home Layout: One level Home Equipment: None      Prior Function            PT Goals (current goals can now be found in the care plan section) Acute Rehab PT Goals Patient Stated Goal: Return Home Progress towards PT goals: Goals met/education completed, patient discharged from PT    Frequency    Min 2X/week      PT Plan      Co-evaluation              AM-PAC PT "6 Clicks" Mobility   Outcome Measure  Help needed turning from your back to your side while in a flat bed without using bedrails?: None Help needed moving from lying on your back to sitting on the side of a flat bed without using bedrails?: None Help needed moving to and from a bed to a chair (including a wheelchair)?: None Help needed standing up from a chair using your arms (e.g., wheelchair or bedside chair)?: None Help needed to walk in hospital room?: None Help needed climbing 3-5 steps with a railing? : None 6 Click Score: 24    End of Session   Activity Tolerance: Patient tolerated treatment well Patient left: in chair;with call bell/phone within reach Nurse Communication: Mobility status PT Visit Diagnosis: Muscle weakness (generalized) (M62.81);Unsteadiness on feet (R26.81);Difficulty in walking, not elsewhere classified (R26.2)     Time: 1308-6578 PT Time Calculation (min) (ACUTE ONLY): 20 min  Charges:    $Gait Training: 8-22 mins PT General Charges $$ ACUTE PT VISIT: 1 Visit                     Glenford Lanes, PT, DPT Acute Rehabilitation Services Office: [phone] Secure Chat Preferred  [name] 03/21/2024, 10:02 AM

## 2024-03-21 NOTE — Plan of Care (Signed)
   Problem: Education: Goal: Knowledge of General Education information will improve Description Including pain rating scale, medication(s)/side effects and non-pharmacologic comfort measures Outcome: Progressing   Problem: Health Behavior/Discharge Planning: Goal: Ability to manage health-related needs will improve Outcome: Progressing

## 2024-03-21 NOTE — Progress Notes (Signed)
 HD#4 Subjective:   Summary: Xavier Owens is a 53 y.o. male with a pertinent PMH of alcohol use disorder, who presented with nausea and vomiting and admitted for alcohol withdrawal c/b aspiration pneumonia.   Overnight Events: None  Patient states he is feeling well today, he has some ongoing right-sided rib and epigastric pain but otherwise has few concerns.  He does inquire about additional medications/withdrawal seizures as he has had these in the past.  He should be outside of the window for additional withdrawal symptoms or withdrawal seizure, this was discussed with him.  Objective:  Vital signs in last 24 hours: Vitals:   03/21/24 0441 03/21/24 0807 03/21/24 0956 03/21/24 1138  BP: 104/67 99/81 117/76 101/81  Pulse: 98 (!) 106 (!) 103   Resp: 18 18 18    Temp: 98.2 F (36.8 C) 98.3 F (36.8 C) 97.8 F (36.6 C)   TempSrc: Oral Oral Oral   SpO2:      Weight:      Height:       Supplemental O2: Room Air SpO2: 100 %   Physical Exam:  Constitutional: In no acute distress Cardiovascular: regular rate and rhythm, no m/r/g Pulmonary/Chest: normal work of breathing on room air, lungs clear to auscultation bilaterally. Mild ttp right sternal border / right anterior ribs Abdominal: soft, non-tender, non-distended, positive bowel sounds. Mild epigastric tenderness Psych: Pleasant affect  Filed Weights   03/16/24 1513  Weight: 81.6 kg      Intake/Output Summary (Last 24 hours) at 03/21/2024 1322 Last data filed at 03/21/2024 0848 Gross per 24 hour  Intake 960 ml  Output 3150 ml  Net -2190 ml   Net IO Since Admission: -2,606.67 mL [03/21/24 1322]  Pertinent Labs:    Latest Ref Rng & Units 03/21/2024    5:10 AM 03/20/2024    4:06 AM 03/19/2024    3:41 AM  CBC  WBC 4.0 - 10.5 K/uL 6.2  6.4  6.1   Hemoglobin 13.0 - 17.0 g/dL 16.1  09.6  04.5   Hematocrit 39.0 - 52.0 % 37.0  36.9  38.0   Platelets 150 - 400 K/uL 251  212  220        Latest Ref Rng & Units 03/21/2024     5:10 AM 03/20/2024    4:06 AM 03/19/2024    3:41 AM  CMP  Glucose 70 - 99 mg/dL 94  90  409   BUN 6 - 20 mg/dL 7  6  6    Creatinine 0.61 - 1.24 mg/dL 8.11  9.14  7.82   Sodium 135 - 145 mmol/L 136  135  135   Potassium 3.5 - 5.1 mmol/L 3.7  3.5  3.9   Chloride 98 - 111 mmol/L 103  104  103   CO2 22 - 32 mmol/L 20  23  20    Calcium  8.9 - 10.3 mg/dL 9.7  8.8  9.2     Assessment/Plan:   Principal Problem:   Nausea & vomiting Active Problems:   Lactic acidosis   Alcohol abuse   Tremor   Neuropathy   Mood disorder (HCC)   Delirium tremens (HCC)   Alcoholic ketosis (HCC)   Dehydration   Acute lactic acidosis  Alcohol withdrawal Admitted to the ICU and required precedex  infusion for 1 day, but that was discontinued in the afternoon on 5/6. The patient was started on a librium  load and taper. Doing well. CIWA persistently 0 last 24 hours.  - Continue librium  taper,  may consider extended course on d/c due to history of seizure.  - Librium  25 mg q6h PRN for CIWA >10 - Hydroxyzine  25 mg q6h PRN - Zofran  4 mg q6h PRN - Loperamide  2-4 mg PRN - Continue folic acid , thiamine , and multivitamin  - TOC consulted for substance abuse resources   Tachycardia  Improved, likely secondary to alcohol withdrawal. Eating well.  - Encourage PO intake   Aspiration pneumonia CT chest showed patchy ground glass opacities in the LLL concerning for infectious process. Suspect this is aspiration pneumonia in the setting of nausea and vomiting.  - Day 5/5 unasyn  - Duonebs q6h PRN   Mood disorder On seroquel  100 mg at bedtime, trazodone  100 mg at bedtime, gabapentin  600 mg TID, cymbalta  60 mg daily, and propranolol  10 mg BID. - Psychiatry consulted by [facility] team, no recommendations for inpatient psych. No medication changes suggested   Normocytic anemia Hb stable  Abd / Chest wall pain Will treat with Maalox, Voltaren  gel, tylenol .   Diet: Normal IVF: None,None VTE: DOAC Code: Full  Dispo:  Anticipated discharge to Home in 1 days pending clinical stability.   Sheree Dieter MD Internal Medicine Resident PGY-1 Pager: [phone] Please contact the on call pager after 5 pm and on weekends at [phone].

## 2024-03-21 NOTE — Progress Notes (Signed)
 Physical Therapy Discharge Patient Details Name: Xavier Owens MRN: [id number] DOB: [birth year] Today's Date: 03/21/2024 Time: 4098-1191 PT Time Calculation (min) (ACUTE ONLY): 20 min  Patient discharged from PT services secondary to goals met and no further PT needs identified.  Please see latest therapy progress note for current level of functioning and progress toward goals.    Progress and discharge plan discussed with patient and/or caregiver: Patient/Caregiver agrees with plan  Glenford Lanes, PT, DPT Acute Rehabilitation Services Office: [phone] Secure Chat Preferred     [name] 03/21/2024, 10:04 AM

## 2024-03-21 NOTE — TOC Progression Note (Signed)
 Transition of Care [facility]) - Progression Note    Patient Details  Name: Xavier Owens MRN: [id number] Date of Birth: 05-Dec-1970  [facility] CM/SW Contact  Carmon Christen, LCSWA Phone Number: 03/21/2024, 4:37 PM  Clinical Narrative:     CSW spoke with patient at bedside. Patient reports PTA he comes from friends house. Patient reports his house is being renovated. Patient reports he is going to stay at another house he has when ready for discharge.Patient reports his aunt or dad will pick him up when medically stable for dc. CSW offered patient [facility] and counseling resources, and outpatient substance use treatment services resources. Patient accepted. All questions answered. No further questions reported at this time.  Expected Discharge Plan: Home/Self Care Barriers to Discharge: Continued Medical Work up  Expected Discharge Plan and Services   Discharge Planning Services: CM Consult   Living arrangements for the past 2 months: Single Family Home                                       Social Determinants of Health (SDOH) Interventions SDOH Screenings   Food Insecurity: No Food Insecurity (03/17/2024)  Housing: Low Risk  (03/17/2024)  Transportation Needs: No Transportation Needs (03/17/2024)  Utilities: Not At Risk (03/17/2024)    Readmission Risk Interventions     No data to display

## 2024-03-21 NOTE — Evaluation (Signed)
 Occupational Therapy Evaluation Patient Details Name: Xavier Owens MRN: [id number] DOB: 1971/04/12 Today's Date: 03/21/2024   History of Present Illness   Xavier Owens is a 53 y.o. male admitted 03/16/24 with ETOH withdrawal c/b aspiration pneumonia. He recently d/c'd home from  behavioral health and was found intoxicated in a neighbor's yard. Pt brought back to the ED intoxicated with intractable nausea and vomiting, ETOH >400 and lactic acid 8.0. He was admitted and placed on CIWA and treated with Ativan  and Unasyn  for aspiration. His Ativan  requirements and CIWA score uptrended and he required 4 point restraints so PCCM consulted for possible precedex  infusion. PMH significant for ETOH abuse, tremor, and neuropathy.     Clinical Impressions Pt reports he lives alone and he has family support with the return to home. Pt reported they are no longer feeling shaky and did not have any changes with mobility. At this time was able to complete UE/LE ADLS post set up. He was able to complete sit to stand transfers and ambulation with supervision to CGA as pt reported they have a "bad R knee" . Pt if to return home would recommendation for [facility] services.   BP standing: 98/81  Standing 3 mins: 106/90  post lateral stepping 91/76  post ambulation 112/76    If plan is discharge home, recommend the following:   Assistance with cooking/housework;A little help with bathing/dressing/bathroom     Functional Status Assessment   Patient has had a recent decline in their functional status and demonstrates the ability to make significant improvements in function in a reasonable and predictable amount of time.     Equipment Recommendations   Tub/shower seat (if they have a place to go with it?)     Recommendations for Other Services         Precautions/Restrictions   Precautions Precautions: None Recall of Precautions/Restrictions: Intact Restrictions Weight Bearing Restrictions Per  Provider Order: No     Mobility Bed Mobility Overal bed mobility:  (presented at EOB and agreed to remain up in the chair)                  Transfers Overall transfer level: Needs assistance Equipment used: None Transfers: Sit to/from Stand Sit to Stand: Supervision, Contact guard assist                  Balance Overall balance assessment: Needs assistance Sitting-balance support: Feet supported Sitting balance-Leahy Scale: Good     Standing balance support: No upper extremity supported Standing balance-Leahy Scale: Fair Standing balance comment: Per pt reporting problems with RLE with knee and normally wears a brace at abseline                           ADL either performed or assessed with clinical judgement   ADL Overall ADL's : Needs assistance/impaired Eating/Feeding: Independent;Sitting   Grooming: Wash/dry hands;Set up;Sitting   Upper Body Bathing: Set up;Sitting   Lower Body Bathing: Set up;Sitting/lateral leans   Upper Body Dressing : Set up;Sitting   Lower Body Dressing: Set up   Toilet Transfer: Supervision/safety;Contact guard assist   Toileting- Clothing Manipulation and Hygiene: Supervision/safety;Sit to/from stand       Functional mobility during ADLs: Supervision/safety;Contact guard assist       Vision         Perception         Praxis         Pertinent Vitals/Pain Pain Assessment Pain  Assessment: No/denies pain     Extremity/Trunk Assessment Upper Extremity Assessment Upper Extremity Assessment: Generalized weakness   Lower Extremity Assessment Lower Extremity Assessment: Defer to PT evaluation   Cervical / Trunk Assessment Cervical / Trunk Assessment: Normal   Communication Communication Communication: No apparent difficulties   Cognition Arousal: Alert Behavior During Therapy: WFL for tasks assessed/performed Cognition: No apparent impairments             OT - Cognition Comments:  However, there are some                 Following commands: Intact       Cueing  General Comments   Cueing Techniques: Verbal cues  BP standing: 98/81 Standign 3 mins: 106/90 post lateral stepping 91/76 post ambulation 112/76   Exercises     Shoulder Instructions      Home Living Family/patient expects to be discharged to:: Private residence Living Arrangements: Alone Available Help at Discharge: Family;Friend(s);Available PRN/intermittently Type of Home: House Home Access: Stairs to enter [employer] of Steps: 2 Entrance Stairs-Rails: None Home Layout: One level     Bathroom Shower/Tub: Producer, television/film/video: Standard     Home Equipment: None          Prior Functioning/Environment Prior Level of Function : Independent/Modified Independent;History of Falls (last six months)             Mobility Comments: Ambulates without AD. Pt reports at least 3 falls in the last 7mo. ADLs Comments: Indep with ADLs/IADLs. Relies on his dad or a friend for transportation.    OT Problem List: Decreased strength;Decreased activity tolerance;Impaired balance (sitting and/or standing);Decreased safety awareness;Decreased knowledge of use of DME or AE   OT Treatment/Interventions: Self-care/ADL training;DME and/or AE instruction;Therapeutic activities;Patient/family education;Balance training      OT Goals(Current goals can be found in the care plan section)   Acute Rehab OT Goals Patient Stated Goal: to walk OT Goal Formulation: With patient Time For Goal Achievement: 04/04/24 Potential to Achieve Goals: Good   OT Frequency:  Min 1X/week    Co-evaluation              AM-PAC OT "6 Clicks" Daily Activity     Outcome Measure Help from another person eating meals?: None Help from another person taking care of personal grooming?: None Help from another person toileting, which includes using toliet, bedpan, or urinal?: A Little Help  from another person bathing (including washing, rinsing, drying)?: A Little Help from another person to put on and taking off regular upper body clothing?: None Help from another person to put on and taking off regular lower body clothing?: None 6 Click Score: 22   End of Session Equipment Utilized During Treatment: Gait belt Nurse Communication: Mobility status  Activity Tolerance: Patient tolerated treatment well Patient left: in chair;with call bell/phone within reach;with chair alarm set  OT Visit Diagnosis: Unsteadiness on feet (R26.81);Repeated falls (R29.6);Other abnormalities of gait and mobility (R26.89);Muscle weakness (generalized) (M62.81)                Time: 3474-2595 OT Time Calculation (min): 40 min Charges:  OT General Charges $OT Visit: 1 Visit OT Evaluation $OT Eval Low Complexity: 1 Low OT Treatments $Self Care/Home Management : 23-37 mins  Xavier Owens OTR/L  Acute Rehab Services  [phone] office number   Xavier Owens 03/21/2024, 8:42 AM

## 2024-03-22 ENCOUNTER — Other Ambulatory Visit (HOSPITAL_COMMUNITY): Payer: Self-pay

## 2024-03-22 ENCOUNTER — Encounter: Payer: Self-pay | Admitting: Family Medicine

## 2024-03-22 DIAGNOSIS — F32A Depression, unspecified: Secondary | ICD-10-CM | POA: Diagnosis not present

## 2024-03-22 DIAGNOSIS — F329 Major depressive disorder, single episode, unspecified: Secondary | ICD-10-CM

## 2024-03-22 DIAGNOSIS — F10939 Alcohol use, unspecified with withdrawal, unspecified: Secondary | ICD-10-CM

## 2024-03-22 DIAGNOSIS — F1090 Alcohol use, unspecified, uncomplicated: Secondary | ICD-10-CM | POA: Diagnosis not present

## 2024-03-22 MED ORDER — THIAMINE HCL 100 MG PO TABS
100.0000 mg | ORAL_TABLET | Freq: Every day | ORAL | 0 refills | Status: DC
  Filled 2024-03-22: qty 30, 30d supply, fill #0

## 2024-03-22 MED ORDER — CERTAVITE/ANTIOXIDANTS PO TABS
ORAL_TABLET | ORAL | 0 refills | Status: DC
  Filled 2024-03-22: qty 30, 30d supply, fill #0

## 2024-03-22 MED ORDER — HYDROXYZINE HCL 25 MG PO TABS
25.0000 mg | ORAL_TABLET | Freq: Four times a day (QID) | ORAL | 0 refills | Status: DC | PRN
  Filled 2024-03-22: qty 30, 8d supply, fill #0

## 2024-03-22 MED ORDER — FOLIC ACID 1 MG PO TABS
1.0000 mg | ORAL_TABLET | Freq: Every day | ORAL | 0 refills | Status: DC
  Filled 2024-03-22: qty 30, 30d supply, fill #0

## 2024-03-22 MED ORDER — SENNOSIDES-DOCUSATE SODIUM 8.6-50 MG PO TABS
2.0000 | ORAL_TABLET | Freq: Two times a day (BID) | ORAL | 0 refills | Status: DC
Start: 2024-03-22 — End: 2024-05-20
  Filled 2024-03-22: qty 120, 30d supply, fill #0

## 2024-03-22 MED ORDER — DICLOFENAC SODIUM 1 % EX GEL
2.0000 g | Freq: Four times a day (QID) | CUTANEOUS | 0 refills | Status: DC
  Filled 2024-03-22: qty 2, fill #0

## 2024-03-22 MED ORDER — TRAZODONE HCL 100 MG PO TABS
100.0000 mg | ORAL_TABLET | Freq: Every day | ORAL | 0 refills | Status: DC
  Filled 2024-03-22 (×2): qty 30, 30d supply, fill #0

## 2024-03-22 MED ORDER — FERROUS SULFATE 325 (65 FE) MG PO TABS
325.0000 mg | ORAL_TABLET | Freq: Every day | ORAL | 0 refills | Status: DC
  Filled 2024-03-22: qty 30, 30d supply, fill #0

## 2024-03-22 MED ORDER — PANTOPRAZOLE SODIUM 40 MG PO TBEC
40.0000 mg | DELAYED_RELEASE_TABLET | Freq: Every day | ORAL | 0 refills | Status: DC
  Filled 2024-03-22: qty 30, 30d supply, fill #0

## 2024-03-22 MED ORDER — DULOXETINE HCL 60 MG PO CPEP
60.0000 mg | ORAL_CAPSULE | Freq: Every day | ORAL | 0 refills | Status: DC
  Filled 2024-03-22: qty 30, 30d supply, fill #0

## 2024-03-22 MED ORDER — QUETIAPINE FUMARATE 100 MG PO TABS
100.0000 mg | ORAL_TABLET | Freq: Every day | ORAL | 0 refills | Status: DC
  Filled 2024-03-22 (×2): qty 30, 30d supply, fill #0

## 2024-03-22 MED ORDER — PROPRANOLOL HCL 10 MG PO TABS
10.0000 mg | ORAL_TABLET | Freq: Two times a day (BID) | ORAL | 0 refills | Status: DC
  Filled 2024-03-22 (×2): qty 60, 30d supply, fill #0

## 2024-03-22 NOTE — Consult Note (Signed)
 [facility] Psychiatric Consult Follow-up  Patient Name: .Xavier Owens  MRN: [id number]  DOB: Jan 11, 1971  Consult Order details:  Orders (From admission, onward)     Start     Ordered   03/19/24 1440  IP CONSULT TO PSYCHIATRY       Comments: Just discharged w/ dx major depressive disorder. See note. Re-admitted w/ ETOH and then wd. Had recent medication adjustments in-pt. Reason for consult: further drug titration for MDD in case this is contributing to his ETOH dependence  Ordering Provider: Hadley Leu, NP  Provider:  (Not yet assigned)  Question Answer Comment  Location [facility]   Reason for Consult? medication adjustment      03/19/24 1441             Mode of Visit: In person    Psychiatry Consult Evaluation  Service Date: Mar 22, 2024 LOS:  LOS: 5 days  Chief Complaint "Ready to go home"   Primary Psychiatric Diagnoses  Major depressive disorder, moderate Alcohol abuse/acute withdrawal Tobacco dependence Tremor   Assessment  Xavier Owens is a 53 y.o. male admitted: Medically on  03/16/2024  3:08 PM for alcohol intoxication. He carries the psychiatric diagnoses of Major Depressive Disorder, Tremor, and Alcohol abuse    03/20/24 His current presentation of alcohol relapse and depressed mood meets criteria for alcohol withdrawal.  Patient's differential includes substance-induced mood disorder, and her depressive disorder.  Per collateral, patient has been more depressed, and with multiple deaths in the family, isolation, loss of job he is at increased risk for suicide.  There is uncertainty as to whether or not he still has access to weapons.  Patient stated he has been compliant with antidepressant medications.  He is not certain if he has had any benefit from Cymbalta , but states that Seroquel  has been helpful allows him to sleep.  He however reports poor sleep last night.  Psychiatry will continue to reevaluate this patient completes Librium   taper and CIWA protocol.  He would benefit from ongoing treatment to be determined either inpatient psychiatry, comical dependency intensive outpatient program, versus outpatient or residential substance use rehabilitation. On initial examination, patient is engaged and cooperative.  Denies his psychiatric symptoms.  He denies SI, HI, or AVH.   Please see plan below for detailed recommendations.  Diagnoses:  Active Hospital problems: Principal Problem:   Nausea & vomiting Active Problems:   Lactic acidosis   Alcohol abuse   Tremor   Neuropathy   Mood disorder (HCC)   Delirium tremens (HCC)   Alcoholic ketosis (HCC)   Dehydration   Acute lactic acidosis    Plan   ## Psychiatric Medication Recommendations:  Continue current medication. Will need inpatient psychiatric admission. Will defer CIWA and Librium  taper for primary team.    He will need a safety plan prior to discharge to ensure that guns are secure.   ## Medical Decision Making Capacity: Not specifically addressed in this encounter  ## Further Work-up:  -- Check in with social work to make sure there is a plan for housing following discharge.    ## Disposition:-- pending. Patient would benefit from substance use rehabilitation following discharge.  He is essentially homeless and unemployed with high risk factors for suicide despite denying suicidal ideation.   ## Behavioral / Environmental: - No specific recommendations at this time.     ## Safety and Observation Level:  - Based on my clinical evaluation, I estimate the patient to be at moderate  risk of self harm in the current setting. - At this time, we recommend  routine observation. This decision is based on my review of the chart including patient's history and current presentation, interview of the patient, mental status examination, and consideration of suicide risk including evaluating suicidal ideation, plan, intent, suicidal or self-harm behaviors, risk  factors, and protective factors. This judgment is based on our ability to directly address suicide risk, implement suicide prevention strategies, and develop a safety plan while the patient is in the clinical setting. Please contact our team if there is a concern that risk level has changed.  CSSR Risk Category:C-SSRS RISK CATEGORY: No Risk  Suicide Risk Assessment: Patient has following modifiable risk factors for suicide: access to guns and under treated depression , which we are addressing through education. Patient has following non-modifiable or demographic risk factors for suicide: male gender Patient has the following protective factors against suicide: Supportive family, Supportive friends, no history of suicide attempts, and no history of NSSIB  Thank you for this consult request. Recommendations have been communicated to the primary team.  We will monitor at this time.   Prudencio Browner Cecillia Cogan, Medical Student       History of Present Illness  Relevant Aspects of [facility] Course:  Admitted on 03/16/2024 for alcohol intoxication. Patient was started on a librium  load and taper to address his alcohol withdrawal. CT chest showed patchy ground glass opacities concerning for Aspiration pneumonia in the setting of his nausea and vomiting. A regimen of Unasyn  was started along with Duonebs every 6 hours as needed. A Speech Language Pathologist was consulted.    Patient Report:  03/20/24 On 03/16/24 , the patient drank around a pint of vodka in his neighbor's yard and then subsequently passed out and hit his head. He does not recall the recent events leading up to his intoxication. Since being admitted, his nausea and vomiting have improved. He does however have constant "chest and abdominal discomfort" that make it hard for him to fall asleep.  He specifically denied any suicidal or homicidal ideation, plan, or tent.  He denies any AVH and did not appear to be responding to internal  stimuli.   03/21/24 The patient is feeling much better. He slept well last night and reports the med adjustments have been favorable. He is "feeling 100" physically compared to yesterday. He talked to his aunt today and he is open to alcohol cessation. His appetite is much improved. However he still feels slight pain in his chest and abdominal area. He is open to talking to a therapist/psychiatrist about his grief involving the death of his grandmother.   09-Apr-2024 The patient repeats again that he is "feeling 100." He slept "like a rock," sleeping between 10 pm and 4 am. His energy is fine and his mood is upbeat. He wants to go home. He says that his chest and abdominal discomfort is pretty much gone. Reports no other changes in his health.    Psych ROS:  Depression: Medicated but still fluctuates  Anxiety:  Denies Mania (lifetime and current): Denies Psychosis: (lifetime and current): Denies  Collateral information:  03/20/24 Patient has provided consent to speak with his father Dione, Donais. [phone]) and his fiance Trevor Fudge [phone]). Spoke with Trevor Fudge, that has known patient since they were 12 years, and they have "been together" for the past 15 years. She could tell that he had been drinking for the past 3 days.  Trevor Fudge has been worried about  him being suicidal since patient's grandmother died in 01-04-2024.  He has had a lot of loss of relatives in the past year.  He has been staying in his great aunt's home, against her wishes.  The house does not have any power.  He will bathe at his friend, Brian's home. She believes his guns have been taken by the police.  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  He is essentially homeless. He is not working.  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    Review of Systems  Cardiovascular:  Positive for chest pain.       Slight discomfort today   Gastrointestinal:  Positive for abdominal pain.       Slight discomfort today  Psychiatric/Behavioral:  Positive for depression and memory loss.      Psychiatric and Social History  Psychiatric History:  Information collected from [facility] Cecillia Cogan, Medical Student   Prev Dx/Sx: Major Depressive Disorder Current Psych Provider: Outpatient provider at [facility].  Home Meds (current): Librium , Cymbalta , Neurontin , Depade, Potassium Chloride  tablet, Inderal , Seroquel , Desyrel , Xarelto  Previous Med Trials: N/A Therapy: N/A   [facility] Prior Self Harm: Denies Prior Violence: Denies   Family Psych History: Denies Family Hx suicide: Denies   Social History:  Developmental Hx: Not accessed  Educational Hx: Not accessed  Occupational Hx: Works in Holiday representative. Legal Hx: Not accessed  Living Situation: Lives alone in a one story house. Relies on his father and/or friends for transportation.  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 Safety planning regarding weapons was completed by inpatient psychiatry team during his admission March 2025   Substance History Alcohol: Chronic drinker   Type of alcohol: Vodka Last Drink: 03/16/24 Number of drinks per day: A pint of vodka a week  History of alcohol withdrawal seizures: 2 reportedly back to back in 01/04/2024  History of DT's: 03/18/24 Tobacco: Smokes a pack of menthol every 4 days. Smoking since he was ~15 (38 years)  Illicit drugs: Denies Prescription drug abuse: Denies Rehab hx: Was at [facility] for Detox  Exam Findings  Physical Exam:  Vital Signs:  Temp:  [97.7 F (36.5 C)-98.6 F (37 C)] 97.7 F (36.5 C) (05/09 0804) Pulse Rate:  [80-98] 91 (05/09 0804) Resp:  [16-18] 18 (05/09 0804) BP:  (92-124)/(62-92) 116/75 (05/09 0804) SpO2:  [95 %-96 %] 96 % (05/09 0429) Blood pressure 116/75, pulse 91, temperature 97.7 F (36.5 C), temperature source Oral, resp. rate 18, height 5\' 7"  (1.702 m), weight 81.6 kg, SpO2 96%. Body mass index is 28.18 kg/m.  Physical Exam  Mental Status Exam: General Appearance: Casual  Orientation:  Full (Time, Place, and Person)  Memory:  Immediate;   Good Recent;   Good Remote;   Fair Pt reports that after having back to back seizures in January 04, 2024, his mind has been a little "foggy.   Concentration:  Concentration: Good and Attention Span: Good  Recall:  Good  Attention  Good  Eye Contact:  Fair  Speech:  Patient reports that he knows "what I want to say, but I have a hard time getting it out." However his speech seemed coherent and clear to  me.   Language:  Good  Volume:  Normal  Mood: "Feeling 100"   Affect:  Appropriate  Thought Process:  Coherent and Linear  Thought Content:  Logical and Hallucinations: None  Suicidal Thoughts:  No  Homicidal Thoughts:  No  Judgement:  Good  Insight:  Good  Psychomotor Activity:  Negative  Akathisia:  No  Fund of Knowledge:  Good      Assets:  Communication Skills Desire for Improvement  Cognition:  WNL  ADL's:  Intact  AIMS (if indicated):        Other History   These have been pulled in through the EMR, reviewed, and updated if appropriate.  Family History:  The patient's Family history is unknown by patient.  Medical History: Past Medical History:  Diagnosis Date   Tremor     Surgical History: History reviewed. No pertinent surgical history.   Medications:   Current Facility-Administered Medications:    acetaminophen  (TYLENOL ) tablet 650 mg, 650 mg, Oral, Q6H PRN, Babcock, Peter E, NP, 650 mg at 03/20/24 0849   [COMPLETED] chlordiazePOXIDE  (LIBRIUM ) capsule 25 mg, 25 mg, Oral, QID, 25 mg at 03/20/24 0849 **FOLLOWED BY** [COMPLETED] chlordiazePOXIDE  (LIBRIUM ) capsule 25 mg, 25 mg,  Oral, TID, 25 mg at 03/21/24 1135 **FOLLOWED BY** chlordiazePOXIDE  (LIBRIUM ) capsule 25 mg, 25 mg, Oral, BH-qamhs, 25 mg at 03/22/24 0815 **FOLLOWED BY** [START ON 03/24/2024] chlordiazePOXIDE  (LIBRIUM ) capsule 25 mg, 25 mg, Oral, Daily, Atway, Rayann N, DO   Chlorhexidine  Gluconate Cloth 2 % PADS 6 each, 6 each, Topical, Daily, Hadley Leu, NP, 6 each at 03/22/24 1610   diclofenac  Sodium (VOLTAREN ) 1 % topical gel 2 g, 2 g, Topical, QID, Atway, Rayann N, DO, 2 g at 03/22/24 9604   DULoxetine  (CYMBALTA ) DR capsule 60 mg, 60 mg, Oral, Daily, Babcock, Peter E, NP, 60 mg at 03/22/24 5409   ferrous sulfate  tablet 325 mg, 325 mg, Oral, QHS, Mervyn Ace, MD, 325 mg at 03/21/24 2049   folic acid  (FOLVITE ) tablet 1 mg, 1 mg, Oral, Daily, Babcock, Peter E, NP, 1 mg at 03/22/24 8119   gabapentin  (NEURONTIN ) capsule 600 mg, 600 mg, Oral, TID, Babcock, Peter E, NP, 600 mg at 03/22/24 0815   ipratropium-albuterol  (DUONEB) 0.5-2.5 (3) MG/3ML nebulizer solution 3 mL, 3 mL, Nebulization, Q6H PRN, Babcock, Peter E, NP   multivitamin with minerals tablet 1 tablet, 1 tablet, Oral, Daily, Babcock, Peter E, NP, 1 tablet at 03/20/24 0847   multivitamin with minerals tablet 1 tablet, 1 tablet, Oral, Daily, Mervyn Ace, MD, 1 tablet at 03/22/24 1478   ondansetron  (ZOFRAN ) injection 4 mg, 4 mg, Intravenous, Q6H PRN, Babcock, Peter E, NP   pantoprazole  (PROTONIX ) EC tablet 40 mg, 40 mg, Oral, Daily, Babcock, Peter E, NP, 40 mg at 03/22/24 0815   propranolol  (INDERAL ) tablet 10 mg, 10 mg, Oral, BID, Babcock, Peter E, NP, 10 mg at 03/22/24 2956   QUEtiapine  (SEROQUEL ) tablet 100 mg, 100 mg, Oral, QHS, Mervyn Ace, MD, 100 mg at 03/21/24 2049   rivaroxaban  (XARELTO ) tablet 10 mg, 10 mg, Oral, Daily, Sheree Dieter, MD, 10 mg at 03/22/24 0813   senna-docusate (Senokot-S) tablet 2 tablet, 2 tablet, Oral, BID, Atway, Rayann N, DO, 2 tablet at 03/21/24 1135   thiamine  (VITAMIN B1) tablet 100 mg, 100 mg, Oral,  Daily, 100 mg at 03/22/24 0814 **OR** thiamine  (VITAMIN B1) injection 100 mg, 100 mg, Intravenous, Daily, Babcock, Peter E, NP, 100 mg at 03/17/24 0942   traZODone  (DESYREL ) tablet  100 mg, 100 mg, Oral, QHS, Mervyn Ace, MD, 100 mg at 03/21/24 2049  Allergies: No Known Allergies  Prudencio Browner Cecillia Cogan, Medical Student

## 2024-03-22 NOTE — Progress Notes (Signed)
 Occupational Therapy Treatment Patient Details Name: Xavier Owens MRN: [id number] DOB: [birth year] Today's Date: 03/22/2024   History of present illness Xavier Owens is a 53 y.o. male admitted 03/16/24 with ETOH withdrawal c/b aspiration pneumonia. He recently d/c'd home from  behavioral health and was found intoxicated in a neighbor's yard. Pt brought back to the ED intoxicated with intractable nausea and vomiting, ETOH >400 and lactic acid 8.0. He was admitted and placed on CIWA and treated with Ativan  and Unasyn  for aspiration. His Ativan  requirements and CIWA score uptrended and he required 4 point restraints so PCCM consulted for possible precedex  infusion. PMH significant for ETOH abuse, tremor, and neuropathy.   OT comments  Patient demonstrating good gains with OT treatment. Patient able to perform item retrieval for self care with supervision and no AD. Shower/tub transfer performed with simulated tub with patient requiring CGA for first attempt and supervision on second attempt. Discharge recommendations continue to be appropriate. Acute OT to continue to follow to address established goals.       If plan is discharge home, recommend the following:  Assistance with cooking/housework;A little help with bathing/dressing/bathroom   Equipment Recommendations  Tub/shower seat (if they have a place to go with it?)    Recommendations for Other Services      Precautions / Restrictions Precautions Precautions: Fall Recall of Precautions/Restrictions: Intact Restrictions Weight Bearing Restrictions Per Provider Order: No       Mobility Bed Mobility Overal bed mobility: Independent             General bed mobility comments: OOB upon entry    Transfers Overall transfer level: Independent Equipment used: None               General transfer comment: performed transfer in room with no AD, CGA to supervision for tub transfer     Balance Overall balance assessment:  No apparent balance deficits (not formally assessed) Sitting-balance support: Feet supported Sitting balance-Leahy Scale: Good     Standing balance support: No upper extremity supported Standing balance-Leahy Scale: Fair Standing balance comment: no LOB with static and dynamic standing balance tasks                           ADL either performed or assessed with clinical judgement   ADL Overall ADL's : Needs assistance/impaired     Grooming: Supervision/safety;Standing Grooming Details (indicate cue type and reason): at sink             Lower Body Dressing: Set up   Toilet Transfer: Supervision/safety       Tub/ Shower Transfer: Supervision/safety;Contact guard Ship broker Details (indicate cue type and reason): performed simulated tub transfer with patient requiring CGA for safety on first attempt and supervision on second attempt Functional mobility during ADLs: Supervision/safety General ADL Comments: Patient performed item retrieval for self care with supervison and no AD    Extremity/Trunk Assessment              Vision       Perception     Praxis     Communication Communication Communication: No apparent difficulties   Cognition Arousal: Alert Behavior During Therapy: WFL for tasks assessed/performed Cognition: No apparent impairments                               Following commands: Intact Following commands impaired: Follows one step commands with  increased time, Follows multi-step commands with increased time      Cueing   Cueing Techniques: Verbal cues  Exercises      Shoulder Instructions       General Comments VSS on RA    Pertinent Vitals/ Pain       Pain Assessment Pain Assessment: No/denies pain Pain Intervention(s): Monitored during session  Home Living                                          Prior Functioning/Environment              Frequency  Min 1X/week         Progress Toward Goals  OT Goals(current goals can now be found in the care plan section)  Progress towards OT goals: Progressing toward goals  Acute Rehab OT Goals Patient Stated Goal: to go home OT Goal Formulation: With patient Time For Goal Achievement: 04/04/24 Potential to Achieve Goals: Good ADL Goals Pt Will Perform Tub/Shower Transfer: Shower transfer;ambulating Additional ADL Goal #1: Pt will gather ADL items without LOB  Plan      Co-evaluation                 AM-PAC OT "6 Clicks" Daily Activity     Outcome Measure   Help from another person eating meals?: None Help from another person taking care of personal grooming?: None Help from another person toileting, which includes using toliet, bedpan, or urinal?: A Little Help from another person bathing (including washing, rinsing, drying)?: A Little Help from another person to put on and taking off regular upper body clothing?: None Help from another person to put on and taking off regular lower body clothing?: None 6 Click Score: 22    End of Session Equipment Utilized During Treatment: Gait belt  OT Visit Diagnosis: Unsteadiness on feet (R26.81);Repeated falls (R29.6);Other abnormalities of gait and mobility (R26.89);Muscle weakness (generalized) (M62.81)   Activity Tolerance Patient tolerated treatment well   Patient Left in chair;with call bell/phone within reach;with chair alarm set   Nurse Communication Mobility status        Time: 4782-9562 OT Time Calculation (min): 11 min  Charges: OT General Charges $OT Visit: 1 Visit OT Treatments $Self Care/Home Management : 8-22 mins  Anitra Barn, OTA Acute Rehabilitation Services  Office [phone]   Jovita Nipper 03/22/2024, 12:48 PM

## 2024-03-22 NOTE — Discharge Summary (Signed)
 Name: Xavier Owens MRN: [id number] DOB: Dec 04, 1970 53 y.o. PCP: Pcp, No  Date of Admission: 03/16/2024  3:08 PM Date of Discharge:  03/22/2024 Attending Physician: Dr. Bevelyn Bryant, MD  DISCHARGE DIAGNOSIS:  Primary Problem: Nausea & vomiting   Hospital Problems: Principal Problem:   Nausea & vomiting Active Problems:   Alcohol abuse   Tremor   Neuropathy   Mood disorder (HCC)   Delirium tremens (HCC)   Alcoholic ketosis (HCC)   Dehydration   Acute lactic acidosis   Reactive depression    DISCHARGE MEDICATIONS:   Allergies as of 03/22/2024   No Known Allergies      Medication List     STOP taking these medications    chlordiazePOXIDE  25 MG capsule Commonly known as: LIBRIUM    potassium chloride  SA 20 MEQ tablet Commonly known as: KLOR-CON  M   Xarelto  10 MG Tabs tablet Generic drug: rivaroxaban        TAKE these medications    CertaVite/Antioxidants Tabs Take 1-2 tablets by mouth daily   diclofenac  Sodium 1 % Gel Commonly known as: VOLTAREN  Apply 2 g topically 4 (four) times daily.   DULoxetine  60 MG capsule Commonly known as: CYMBALTA  Take 1 capsule (60 mg total) by mouth daily.   ferrous sulfate  325 (65 FE) MG tablet Take 1 tablet (325 mg total) by mouth at bedtime.   folic acid  1 MG tablet Commonly known as: FOLVITE  Take 1 tablet (1 mg total) by mouth daily. Start taking on: Mar 23, 2024   gabapentin  300 MG capsule Commonly known as: NEURONTIN  Take 600 mg by mouth 3 (three) times daily.   hydrOXYzine  25 MG tablet Commonly known as: ATARAX  Take 1 tablet (25 mg total) by mouth every 6 (six) hours as needed for anxiety (or CIWA score </= 10).   naltrexone  50 MG tablet Commonly known as: DEPADE Take 50 mg by mouth daily.   pantoprazole  40 MG tablet Commonly known as: PROTONIX  Take 1 tablet (40 mg total) by mouth daily. Start taking on: Mar 23, 2024   propranolol  10 MG tablet Commonly known as: INDERAL  Take 1 tablet (10 mg total) by  mouth 2 (two) times daily.   QUEtiapine  100 MG tablet Commonly known as: SEROQUEL  Take 1 tablet (100 mg total) by mouth at bedtime.   senna-docusate 8.6-50 MG tablet Commonly known as: Senokot-S Take 2 tablets by mouth 2 (two) times daily.   thiamine  100 MG tablet Commonly known as: VITAMIN B1 Take 1 tablet (100 mg total) by mouth daily. Start taking on: Mar 23, 2024   traZODone  100 MG tablet Commonly known as: DESYREL  Take 1 tablet (100 mg total) by mouth at bedtime.        DISPOSITION AND FOLLOW-UP:  Xavier Owens was discharged from [facility] in Fair condition. At the hospital follow up visit please address:  Follow-up Recommendations: Consults: Psychiatry Labs: CBC Studies: None Medications: Continue to titrate medications outpatient as indicated   HOSPITAL COURSE:  Patient Summary: Xavier Owens is a 53 year old gentleman who presented to the emergency department in the setting of intoxication and aspiration.  The patient was treated for aspiration pneumonia with 5 days of Unasyn .  He required brief stay in the ICU for Precedex  due to alcohol withdrawal, after which a Librium  taper was prescribed and completed.  He has a history of withdrawal seizures, but at time of discharge his CIWA scores were 0.  Psychiatry was consulted, who stated that the patient would likely benefit from  inpatient psychiatry management.  Ultimately the patient declined inpatient admission citing financial concerns and necessity to get back to work.  Prior to discharge, I did speak with the patient's father who confirmed that the patient will be staying with him after discharge.  The patient states that he does own firearms which are locked in a safe at his home.  He says his home is being renovated and he will be staying with his father and therefore will not have access to firearms.  I also did asked the patient's father if he would be able to secure the firearms and  restrict the patient's access to them in this tenuous transition period.  He stated that he would be able to secure the firearms during this time.  Alcohol withdrawal Housing instability Depression As detailed above, no withdrawal seizure during his stay here.  At time of discharge the patient states he is doing well from a mental health standpoint, requesting refills on his medications.  He is interested in continuing to get treatment outpatient.  TOC was consulted for substance abuse resources as well as housing resources.   Aspiration pneumonia CT chest showed patchy ground glass opacities in the LLL concerning for infectious process. Suspect this is aspiration pneumonia in the setting of nausea and vomiting.  He was treated with 5 days of Unasyn  inpatient.   Mood disorder On seroquel  100 mg at bedtime, trazodone  100 mg at bedtime, gabapentin  600 mg TID, cymbalta  60 mg daily, and propranolol  10 mg BID. - Psychiatry consulted by [facility] team. No medication changes suggested   Normocytic anemia Hb stable, consider outpatient workup.   Abd / Chest wall pain Favored to be MSK/secondary to gastritis.  Treated with Maalox, Voltaren  gel, tylenol .    DISCHARGE INSTRUCTIONS:    SUBJECTIVE:  Patient states he is feeling well today on rounds.  Endorses some ongoing depression due to recent loss of his grandmother.  He adamantly denies suicidal or homicidal ideation.  He states that he does have firearms at his home, but will not be staying at his home and will not have access to them.  I did speak with the patient's father whom he will be discharging with to confirm this as well as to confirm that the father will be able to limit his access to firearms.  Patient's father states that he will be able to restrict the patient's access to firearms.  Discharge Vitals:   BP 103/88 (BP Location: Left Arm)   Pulse 83   Temp 97.8 F (36.6 C) (Oral)   Resp 18   Ht 5\' 7"  (1.702 m)   Wt 81.6 kg   SpO2 96%    BMI 28.18 kg/m   OBJECTIVE:   Constitutional: In no acute distress Cardiovascular: regular rate and rhythm, no m/r/g Pulmonary/Chest: normal work of breathing on room air, lungs clear to auscultation bilaterally. Mild ttp right sternal border / right anterior ribs Abdominal: soft, non-tender, non-distended, positive bowel sounds. Mild epigastric tenderness Psych: Pleasant affect   Pertinent Labs, Studies, and Procedures:     Latest Ref Rng & Units 03/21/2024    5:10 AM 03/20/2024    4:06 AM 03/19/2024    3:41 AM  CBC  WBC 4.0 - 10.5 K/uL 6.2  6.4  6.1   Hemoglobin 13.0 - 17.0 g/dL 82.9  56.2  13.0   Hematocrit 39.0 - 52.0 % 37.0  36.9  38.0   Platelets 150 - 400 K/uL 251  212  220  Latest Ref Rng & Units 03/21/2024    5:10 AM 03/20/2024    4:06 AM 03/19/2024    3:41 AM  CMP  Glucose 70 - 99 mg/dL 94  90  782   BUN 6 - 20 mg/dL 7  6  6    Creatinine 0.61 - 1.24 mg/dL 9.56  2.13  0.86   Sodium 135 - 145 mmol/L 136  135  135   Potassium 3.5 - 5.1 mmol/L 3.7  3.5  3.9   Chloride 98 - 111 mmol/L 103  104  103   CO2 22 - 32 mmol/L 20  23  20    Calcium  8.9 - 10.3 mg/dL 9.7  8.8  9.2     CT CHEST ABDOMEN PELVIS W CONTRAST Result Date: 03/16/2024 CLINICAL DATA:  Bowel obstruction suspected.  Chest wall pain. EXAM: CT CHEST, ABDOMEN, AND PELVIS WITH CONTRAST TECHNIQUE: Multidetector CT imaging of the chest, abdomen and pelvis was performed following the standard protocol during bolus administration of intravenous contrast. RADIATION DOSE REDUCTION: This exam was performed according to the departmental dose-optimization program which includes automated exposure control, adjustment of the mA and/or kV according to patient size and/or use of iterative reconstruction technique. CONTRAST:  75mL OMNIPAQUE  IOHEXOL  350 MG/ML SOLN COMPARISON:  CT angiogram chest 05/03/2021. FINDINGS: CT CHEST FINDINGS Cardiovascular: Heart is borderline enlarged. There is no pericardial effusion. Aorta is normal in  size. Mediastinum/Nodes: No enlarged mediastinal, hilar, or axillary lymph nodes. Thyroid  gland, trachea, and esophagus demonstrate no significant findings. Lungs/Pleura: There are minimal atelectatic changes in the bilateral lower lobes. There is some patchy ground-glass opacities in the left lower lobe is well. The lungs are otherwise clear. There is no pleural effusion or pneumothorax. There is a stable 2 mm nodule in the right middle lobe image 5/68 compatible with benign etiology. Musculoskeletal: No acute osseous abnormality. There are mild chronic compression deformities of the superior endplate of T5 and T6 similar to prior. CT ABDOMEN PELVIS FINDINGS Hepatobiliary: There is diffuse fatty infiltration of the liver. The gallbladder and bile ducts are within normal limits. Pancreas: Unremarkable. No pancreatic ductal dilatation or surrounding inflammatory changes. Spleen: Normal in size without focal abnormality. Adrenals/Urinary Tract: Adrenal glands are unremarkable. Kidneys are normal, without renal calculi, focal lesion, or hydronephrosis. Bladder is unremarkable. Stomach/Bowel: Stomach is within normal limits. Appendix appears normal. No evidence of bowel wall thickening, distention, or inflammatory changes. There is sigmoid and descending colon diverticulosis. Vascular/Lymphatic: No significant vascular findings are present. No enlarged abdominal or pelvic lymph nodes. Reproductive: Prostate is unremarkable. Other: No abdominal wall hernia or abnormality. No abdominopelvic ascites. Musculoskeletal: No acute fracture or dislocation identified. There are mixed sclerotic and lucent bubbly appearance identified in the anterior left femoral head. No collapse of the cortex identified. IMPRESSION: 1. Patchy ground-glass opacities in the left lower lobe worrisome for infectious/inflammatory process. 2. Fatty infiltration of the liver. 3. Colonic diverticulosis. 4. Mixed sclerotic and lucent bubbly appearance in  the anterior left femoral head worrisome for avascular necrosis. No collapse of the cortex identified. Electronically Signed   By: Tyron Gallon M.D.   On: 03/16/2024 22:11   CT HEAD WO CONTRAST ( ) Result Date: 03/16/2024 CLINICAL DATA:  Mental status change, unknown cause EXAM: CT HEAD WITHOUT CONTRAST TECHNIQUE: Contiguous axial images were obtained from the base of the skull through the vertex without intravenous contrast. RADIATION DOSE REDUCTION: This exam was performed according to the departmental dose-optimization program which includes automated exposure control, adjustment of the mA and/or  kV according to patient size and/or use of iterative reconstruction technique. COMPARISON:  None Available. FINDINGS: Brain: No evidence of acute infarction, hemorrhage, hydrocephalus, extra-axial collection or mass lesion/mass effect. Vascular: No hyperdense vessel. Skull: No acute fracture. Sinuses/Orbits: Mild paranasal sinus mucosal thickening. No acute orbital findings. Other: No mastoid effusions. IMPRESSION: No evidence of acute intracranial abnormality. Electronically Signed   By: Stevenson Elbe M.D.   On: 03/16/2024 22:01   DG Chest 2 View Result Date: 03/16/2024 CLINICAL DATA:  Shortness of breath and chest pain. EXAM: CHEST - 2 VIEW COMPARISON:  Chest radiograph dated 03/15/2024. FINDINGS: No focal consolidation, pleural effusion or pneumothorax. The cardiac silhouette is within normal limits. No acute osseous pathology. IMPRESSION: No active cardiopulmonary disease. Electronically Signed   By: Angus Bark M.D.   On: 03/16/2024 17:08     Signed: Sheree Dieter MD Internal Medicine Resident, PGY-1 [facility]  Pager: [phone] 2:10 PM, 03/22/2024

## 2024-03-22 NOTE — Plan of Care (Signed)

## 2024-04-02 ENCOUNTER — Emergency Department (HOSPITAL_COMMUNITY)
Admission: EM | Admit: 2024-04-02 | Discharge: 2024-04-03 | Disposition: A | Discharge status: Other Acute Inpatient Hospital | Payer: MEDICAID | Attending: Emergency Medicine | Admitting: Emergency Medicine

## 2024-04-02 ENCOUNTER — Other Ambulatory Visit: Payer: Self-pay

## 2024-04-02 DIAGNOSIS — F1010 Alcohol abuse, uncomplicated: Secondary | ICD-10-CM | POA: Insufficient documentation

## 2024-04-02 DIAGNOSIS — I10 Essential (primary) hypertension: Secondary | ICD-10-CM | POA: Diagnosis not present

## 2024-04-02 DIAGNOSIS — R45851 Suicidal ideations: Secondary | ICD-10-CM | POA: Diagnosis not present

## 2024-04-02 DIAGNOSIS — Y908 Blood alcohol level of 240 mg/100 ml or more: Secondary | ICD-10-CM | POA: Insufficient documentation

## 2024-04-02 DIAGNOSIS — F332 Major depressive disorder, recurrent severe without psychotic features: Secondary | ICD-10-CM | POA: Insufficient documentation

## 2024-04-02 LAB — RAPID URINE DRUG SCREEN, HOSP PERFORMED
Amphetamines: NOT DETECTED
Barbiturates: NOT DETECTED
Benzodiazepines: POSITIVE — AB
Cocaine: NOT DETECTED
Opiates: NOT DETECTED
Tetrahydrocannabinol: NOT DETECTED

## 2024-04-02 LAB — CBC WITH DIFFERENTIAL/PLATELET
Abs Immature Granulocytes: 0.02 10*3/uL (ref 0.00–0.07)
Basophils Absolute: 0.1 10*3/uL (ref 0.0–0.1)
Basophils Relative: 1 %
Eosinophils Absolute: 0.1 10*3/uL (ref 0.0–0.5)
Eosinophils Relative: 1 %
HCT: 39.1 % (ref 39.0–52.0)
Hemoglobin: 12.7 g/dL — ABNORMAL LOW (ref 13.0–17.0)
Immature Granulocytes: 0 %
Lymphocytes Relative: 25 %
Lymphs Abs: 1.6 10*3/uL (ref 0.7–4.0)
MCH: 28.6 pg (ref 26.0–34.0)
MCHC: 32.5 g/dL (ref 30.0–36.0)
MCV: 88.1 fL (ref 80.0–100.0)
Monocytes Absolute: 0.7 10*3/uL (ref 0.1–1.0)
Monocytes Relative: 10 %
Neutro Abs: 3.9 10*3/uL (ref 1.7–7.7)
Neutrophils Relative %: 63 %
Platelets: 448 10*3/uL — ABNORMAL HIGH (ref 150–400)
RBC: 4.44 MIL/uL (ref 4.22–5.81)
RDW: 14.5 % (ref 11.5–15.5)
WBC: 6.3 10*3/uL (ref 4.0–10.5)
nRBC: 0 % (ref 0.0–0.2)

## 2024-04-02 LAB — COMPREHENSIVE METABOLIC PANEL WITH GFR
ALT: 23 U/L (ref 0–44)
AST: 38 U/L (ref 15–41)
Albumin: 3.8 g/dL (ref 3.5–5.0)
Alkaline Phosphatase: 116 U/L (ref 38–126)
Anion gap: 12 (ref 5–15)
BUN: <5 mg/dL — ABNORMAL LOW (ref 6–20)
CO2: 23 mmol/L (ref 22–32)
Calcium: 9.6 mg/dL (ref 8.9–10.3)
Chloride: 102 mmol/L (ref 98–111)
Creatinine, Ser: 0.77 mg/dL (ref 0.61–1.24)
GFR, Estimated: >60 mL/min (ref >60)
Glucose, Bld: 90 mg/dL (ref 70–99)
Potassium: 3.3 mmol/L — ABNORMAL LOW (ref 3.5–5.1)
Sodium: 137 mmol/L (ref 135–145)
Total Bilirubin: 0.8 mg/dL (ref 0.0–1.2)
Total Protein: 8.1 g/dL (ref 6.5–8.1)

## 2024-04-02 LAB — ETHANOL: Alcohol, Ethyl (B): 257 mg/dL — ABNORMAL HIGH (ref <15)

## 2024-04-02 NOTE — ED Triage Notes (Signed)
 Patient came in stating "something is not right with my medications last night I was having thoughts of wishing I was dead" "I don't want to harm anyone else." I feel kind of fine now but I haven't had any sleep." Patient stated that if he was to kill himself it would be  him taking all his medications (as he points to the bag of medications he arrived with). Patient c/o right knee pain.

## 2024-04-03 ENCOUNTER — Encounter (HOSPITAL_COMMUNITY): Payer: Self-pay | Admitting: Psychiatry

## 2024-04-03 ENCOUNTER — Inpatient Hospital Stay (HOSPITAL_COMMUNITY)
Admission: AD | Admit: 2024-04-03 | Discharge: 2024-04-12 | DRG: 881 | Disposition: A | Discharge status: Home / Self Care | Payer: MEDICAID | Source: Intra-hospital | Attending: Psychiatry | Admitting: Psychiatry

## 2024-04-03 DIAGNOSIS — Z7901 Long term (current) use of anticoagulants: Secondary | ICD-10-CM | POA: Diagnosis not present

## 2024-04-03 DIAGNOSIS — G250 Essential tremor: Secondary | ICD-10-CM | POA: Diagnosis present

## 2024-04-03 DIAGNOSIS — F1014 Alcohol abuse with alcohol-induced mood disorder: Secondary | ICD-10-CM | POA: Diagnosis present

## 2024-04-03 DIAGNOSIS — Y908 Blood alcohol level of 240 mg/100 ml or more: Secondary | ICD-10-CM | POA: Diagnosis present

## 2024-04-03 DIAGNOSIS — F339 Major depressive disorder, recurrent, unspecified: Secondary | ICD-10-CM | POA: Diagnosis present

## 2024-04-03 DIAGNOSIS — F329 Major depressive disorder, single episode, unspecified: Secondary | ICD-10-CM | POA: Diagnosis present

## 2024-04-03 DIAGNOSIS — G40909 Epilepsy, unspecified, not intractable, without status epilepticus: Secondary | ICD-10-CM | POA: Diagnosis present

## 2024-04-03 DIAGNOSIS — Z79899 Other long term (current) drug therapy: Secondary | ICD-10-CM | POA: Diagnosis not present

## 2024-04-03 DIAGNOSIS — M25561 Pain in right knee: Secondary | ICD-10-CM | POA: Diagnosis present

## 2024-04-03 DIAGNOSIS — R45851 Suicidal ideations: Secondary | ICD-10-CM | POA: Diagnosis present

## 2024-04-03 DIAGNOSIS — I10 Essential (primary) hypertension: Secondary | ICD-10-CM | POA: Diagnosis present

## 2024-04-03 DIAGNOSIS — T4396XA Underdosing of unspecified psychotropic drug, initial encounter: Secondary | ICD-10-CM | POA: Diagnosis present

## 2024-04-03 DIAGNOSIS — G629 Polyneuropathy, unspecified: Secondary | ICD-10-CM | POA: Diagnosis present

## 2024-04-03 DIAGNOSIS — G8929 Other chronic pain: Secondary | ICD-10-CM | POA: Diagnosis present

## 2024-04-03 DIAGNOSIS — F10129 Alcohol abuse with intoxication, unspecified: Secondary | ICD-10-CM | POA: Diagnosis present

## 2024-04-03 DIAGNOSIS — F1010 Alcohol abuse, uncomplicated: Secondary | ICD-10-CM | POA: Diagnosis not present

## 2024-04-03 DIAGNOSIS — Z91138 Patient's unintentional underdosing of medication regimen for other reason: Secondary | ICD-10-CM | POA: Diagnosis not present

## 2024-04-03 DIAGNOSIS — F17210 Nicotine dependence, cigarettes, uncomplicated: Secondary | ICD-10-CM | POA: Diagnosis present

## 2024-04-03 DIAGNOSIS — F1020 Alcohol dependence, uncomplicated: Secondary | ICD-10-CM

## 2024-04-03 DIAGNOSIS — F332 Major depressive disorder, recurrent severe without psychotic features: Secondary | ICD-10-CM | POA: Diagnosis not present

## 2024-04-03 MED ORDER — DULOXETINE HCL 30 MG PO CPEP
60.0000 mg | ORAL_CAPSULE | Freq: Every day | ORAL | Status: DC
  Administered 2024-04-03: 60 mg via ORAL
  Filled 2024-04-03: qty 2

## 2024-04-03 MED ORDER — SODIUM CHLORIDE 0.9 % IV BOLUS: 1000.0000 mL | Freq: Once | INTRAVENOUS | Status: DC

## 2024-04-03 MED ORDER — GABAPENTIN 300 MG PO CAPS
600.0000 mg | ORAL_CAPSULE | Freq: Three times a day (TID) | ORAL | Status: DC
  Administered 2024-04-03 – 2024-04-12 (×27): 600 mg via ORAL
  Filled 2024-04-03 (×27): qty 2

## 2024-04-03 MED ORDER — DULOXETINE HCL 60 MG PO CPEP
60.0000 mg | ORAL_CAPSULE | Freq: Every day | ORAL | Status: DC
  Administered 2024-04-04 – 2024-04-12 (×9): 60 mg via ORAL
  Filled 2024-04-03 (×9): qty 1

## 2024-04-03 MED ORDER — DIPHENHYDRAMINE HCL 50 MG/ML IJ SOLN: 50.0000 mg | Freq: Three times a day (TID) | INTRAMUSCULAR | Status: DC | PRN

## 2024-04-03 MED ORDER — NALTREXONE HCL 50 MG PO TABS
50.0000 mg | ORAL_TABLET | Freq: Every day | ORAL | Status: DC
  Administered 2024-04-03 – 2024-04-12 (×10): 50 mg via ORAL
  Filled 2024-04-03 (×10): qty 1

## 2024-04-03 MED ORDER — DICLOFENAC SODIUM 1 % EX GEL
2.0000 g | Freq: Four times a day (QID) | CUTANEOUS | Status: DC
  Filled 2024-04-03: qty 100

## 2024-04-03 MED ORDER — HALOPERIDOL LACTATE 5 MG/ML IJ SOLN: 5.0000 mg | Freq: Three times a day (TID) | INTRAMUSCULAR | Status: DC | PRN

## 2024-04-03 MED ORDER — QUETIAPINE FUMARATE 100 MG PO TABS
100.0000 mg | ORAL_TABLET | Freq: Every day | ORAL | Status: DC
  Administered 2024-04-03 – 2024-04-11 (×9): 100 mg via ORAL
  Filled 2024-04-03 (×9): qty 1

## 2024-04-03 MED ORDER — LORAZEPAM 2 MG/ML IJ SOLN: 2.0000 mg | Freq: Three times a day (TID) | INTRAMUSCULAR | Status: DC | PRN

## 2024-04-03 MED ORDER — PANTOPRAZOLE SODIUM 40 MG PO TBEC
40.0000 mg | DELAYED_RELEASE_TABLET | Freq: Every day | ORAL | Status: DC
  Administered 2024-04-03 – 2024-04-12 (×10): 40 mg via ORAL
  Filled 2024-04-03 (×10): qty 1

## 2024-04-03 MED ORDER — HYDROXYZINE HCL 25 MG PO TABS
25.0000 mg | ORAL_TABLET | Freq: Four times a day (QID) | ORAL | Status: DC | PRN
  Administered 2024-04-07 – 2024-04-12 (×12): 25 mg via ORAL
  Filled 2024-04-03 (×11): qty 1

## 2024-04-03 MED ORDER — MAGNESIUM HYDROXIDE 400 MG/5ML PO SUSP: 30.0000 mL | Freq: Every day | ORAL | Status: DC | PRN

## 2024-04-03 MED ORDER — LORAZEPAM 1 MG PO TABS: 1.0000 mg | ORAL_TABLET | Freq: Four times a day (QID) | ORAL | Status: DC | PRN

## 2024-04-03 MED ORDER — HALOPERIDOL LACTATE 5 MG/ML IJ SOLN: 10.0000 mg | Freq: Three times a day (TID) | INTRAMUSCULAR | Status: DC | PRN

## 2024-04-03 MED ORDER — TRAZODONE HCL 100 MG PO TABS
100.0000 mg | ORAL_TABLET | Freq: Every day | ORAL | Status: DC
  Administered 2024-04-03: 100 mg via ORAL
  Filled 2024-04-03: qty 1

## 2024-04-03 MED ORDER — PROPRANOLOL HCL 10 MG PO TABS
10.0000 mg | ORAL_TABLET | Freq: Two times a day (BID) | ORAL | Status: DC
  Administered 2024-04-03 – 2024-04-12 (×18): 10 mg via ORAL
  Filled 2024-04-03 (×18): qty 1

## 2024-04-03 MED ORDER — ALUM & MAG HYDROXIDE-SIMETH 200-200-20 MG/5ML PO SUSP: 30.0000 mL | ORAL | Status: DC | PRN

## 2024-04-03 MED ORDER — DIPHENHYDRAMINE HCL 25 MG PO CAPS: 50.0000 mg | ORAL_CAPSULE | Freq: Three times a day (TID) | ORAL | Status: DC | PRN

## 2024-04-03 MED ORDER — GABAPENTIN 300 MG PO CAPS
600.0000 mg | ORAL_CAPSULE | Freq: Three times a day (TID) | ORAL | Status: DC
  Administered 2024-04-03: 600 mg via ORAL
  Filled 2024-04-03: qty 2

## 2024-04-03 MED ORDER — CHOLECALCIFEROL 125 MCG (5000 UT) PO TABS
1.0000 | ORAL_TABLET | Freq: Every day | ORAL | Status: DC
  Administered 2024-04-03 – 2024-04-12 (×10): 5000 [IU] via ORAL
  Filled 2024-04-03 (×10): qty 1

## 2024-04-03 MED ORDER — TRAZODONE HCL 100 MG PO TABS
100.0000 mg | ORAL_TABLET | Freq: Every day | ORAL | Status: DC
  Administered 2024-04-03 – 2024-04-11 (×9): 100 mg via ORAL
  Filled 2024-04-03 (×9): qty 1

## 2024-04-03 MED ORDER — ACETAMINOPHEN 325 MG PO TABS
650.0000 mg | ORAL_TABLET | Freq: Four times a day (QID) | ORAL | Status: DC | PRN
  Administered 2024-04-06 – 2024-04-08 (×2): 650 mg via ORAL
  Filled 2024-04-03 (×2): qty 2

## 2024-04-03 MED ORDER — THIAMINE HCL 100 MG PO TABS
100.0000 mg | ORAL_TABLET | Freq: Every day | ORAL | Status: DC
  Administered 2024-04-03 – 2024-04-04 (×2): 100 mg via ORAL
  Filled 2024-04-03 (×4): qty 1

## 2024-04-03 MED ORDER — HYDROXYZINE HCL 25 MG PO TABS: 25.0000 mg | ORAL_TABLET | Freq: Four times a day (QID) | ORAL | Status: DC | PRN

## 2024-04-03 MED ORDER — HALOPERIDOL 5 MG PO TABS: 5.0000 mg | ORAL_TABLET | Freq: Three times a day (TID) | ORAL | Status: DC | PRN

## 2024-04-03 MED ORDER — FERROUS SULFATE 325 (65 FE) MG PO TABS
325.0000 mg | ORAL_TABLET | Freq: Every day | ORAL | Status: DC
  Administered 2024-04-03 – 2024-04-11 (×9): 325 mg via ORAL
  Filled 2024-04-03 (×9): qty 1

## 2024-04-03 MED ORDER — QUETIAPINE FUMARATE 100 MG PO TABS
100.0000 mg | ORAL_TABLET | Freq: Every day | ORAL | Status: DC
Start: 2024-04-03 — End: 2024-04-03
  Administered 2024-04-03: 100 mg via ORAL
  Filled 2024-04-03: qty 1

## 2024-04-03 NOTE — ED Provider Notes (Signed)
 [facility] AT [facility] Provider Note   CSN: [id number] Arrival date & time: 04/02/24  2240     History  Chief complaint - alcohol abuse and suicidal  Level 5 caveat due to psychiatric disorder Xavier Owens is a 53 y.o. male.  HPI Patient w/history of alcohol use disorder, hypertension presents for suicidal ideation.  Patient reports that something is wrong with his medications and he is having thoughts of harming himself.  Patient reports his plan is to take large amount of pills He also reports chronic right knee pain that is unchanged. He denies any other new physical complaints    Past Medical History:  Diagnosis Date   Alcoholic (HCC)    Hypertension    Seizures (HCC)    Tremor     Home Medications Prior to Admission medications   Medication Sig Start Date End Date Taking? Authorizing Provider  Cholecalciferol  125 MCG (5000 UT) TABS Take 1 tablet (5,000 Units total) by mouth daily. 02/03/24   Timmothy Foots, MD  diclofenac  Sodium (VOLTAREN ) 1 % GEL Apply 2 g topically 4 (four) times daily. 03/22/24   Sheree Dieter, MD  DULoxetine  (CYMBALTA ) 60 MG capsule Take 1 capsule (60 mg total) by mouth daily. 03/22/24   Sheree Dieter, MD  ferrous sulfate  325 (65 FE) MG tablet Take 1 tablet (325 mg total) by mouth at bedtime. 03/22/24   Sheree Dieter, MD  gabapentin  (NEURONTIN ) 300 MG capsule Take 2 capsules (600 mg total) by mouth 3 (three) times daily. 02/07/24   Sheree Dieter, MD  gabapentin  (NEURONTIN ) 300 MG capsule Take 600 mg by mouth 3 (three) times daily. 02/07/24   [provider]  hydrOXYzine  (ATARAX ) 25 MG tablet Take 1 tablet (25 mg total) by mouth every 6 (six) hours as needed for anxiety (or CIWA score </= 10). 03/22/24   Sheree Dieter, MD  Multiple Vitamin (MULTIVITAMIN WITH MINERALS) TABS tablet Take 1 tablet by mouth daily. 02/07/24   Sheree Dieter, MD  Multiple Vitamins-Minerals  (CERTAVITE/ANTIOXIDANTS) TABS Take 1-2 tablets by mouth daily 03/22/24   Sheree Dieter, MD  naltrexone  (DEPADE) 50 MG tablet Take 1 tablet (50 mg total) by mouth daily. 02/07/24   Sheree Dieter, MD  naltrexone  (DEPADE) 50 MG tablet Take 50 mg by mouth daily. 02/03/24   [provider]  pantoprazole  (PROTONIX ) 40 MG tablet Take 1 tablet (40 mg total) by mouth daily. 03/23/24   Sheree Dieter, MD  propranolol  (INDERAL ) 10 MG tablet Take 1 tablet (10 mg total) by mouth 2 (two) times daily. 02/07/24   Sheree Dieter, MD  propranolol  (INDERAL ) 10 MG tablet Take 1 tablet (10 mg total) by mouth 2 (two) times daily. 03/22/24   Sheree Dieter, MD  QUEtiapine  (SEROQUEL ) 100 MG tablet Take 1 tablet (100 mg total) by mouth at bedtime. 03/22/24   Sheree Dieter, MD  senna-docusate (SENOKOT-S) 8.6-50 MG tablet Take 2 tablets by mouth 2 (two) times daily. 03/22/24   Sheree Dieter, MD  thiamine  (VITAMIN B1) 100 MG tablet Take 1 tablet (100 mg total) by mouth daily. 03/23/24   Sheree Dieter, MD  traZODone  (DESYREL ) 100 MG tablet Take 1 tablet (100 mg total) by mouth at bedtime as needed for sleep. 02/07/24 03/08/24  Sheree Dieter, MD  traZODone  (DESYREL ) 100 MG tablet Take 1 tablet (100 mg total) by mouth at bedtime. 03/22/24   Sheree Dieter, MD      Allergies    Patient has no known allergies.    Review  of Systems   Review of Systems  Unable to perform ROS: Psychiatric disorder    Physical Exam Updated Vital Signs BP 132/86 (BP Location: Right Arm)   Pulse (!) 104   Temp 97.8 F (36.6 C) (Oral)   Resp 17   SpO2 98%  Physical Exam CONSTITUTIONAL: Disheveled HEAD: Normocephalic/atraumatic, no visible trauma ENMT: Mucous membranes moist NECK: supple no meningeal signs SPINE/BACK:entire spine nontender CV: S1/S2 noted, no murmurs/rubs/gallops noted LUNGS: Lungs are clear to auscultation bilaterally, no apparent distress ABDOMEN: soft, nontender NEURO: Pt is  awake/alert/appropriate, moves all extremitiesx4.  No facial droop.   EXTREMITIES: pulses normal/equal, full ROM Full range of motion of right knee, no bruising, deformities, no effusion SKIN: warm, color normal PSYCH: Mildly anxious  ED Results / Procedures / Treatments   Labs (all labs ordered are listed, but only abnormal results are displayed) Labs Reviewed  COMPREHENSIVE METABOLIC PANEL WITH GFR - Abnormal; Notable for the following components:      Result Value   Potassium 3.3 (*)    BUN <5 (*)    All other components within normal limits  ETHANOL - Abnormal; Notable for the following components:   Alcohol, Ethyl (B) 257 (*)    All other components within normal limits  RAPID URINE DRUG SCREEN, HOSP PERFORMED - Abnormal; Notable for the following components:   Benzodiazepines POSITIVE (*)    All other components within normal limits  CBC WITH DIFFERENTIAL/PLATELET - Abnormal; Notable for the following components:   Hemoglobin 12.7 (*)    Platelets 448 (*)    All other components within normal limits    EKG None  Radiology No results found.  Procedures Procedures    Medications Ordered in ED Medications  LORazepam  (ATIVAN ) tablet 1 mg (has no administration in time range)  DULoxetine  (CYMBALTA ) DR capsule 60 mg (has no administration in time range)  gabapentin  (NEURONTIN ) capsule 600 mg (has no administration in time range)  hydrOXYzine  (ATARAX ) tablet 25 mg (has no administration in time range)  QUEtiapine  (SEROQUEL ) tablet 100 mg (has no administration in time range)  traZODone  (DESYREL ) tablet 100 mg (has no administration in time range)    ED Course/ Medical Decision Making/ A&P Clinical Course as of 04/03/24 0113  Wed Apr 03, 2024  0106 Alcohol, Ethyl (B)(!): 257 Alcohol intoxication noted [DW]    Clinical Course User Index [DW] Eldon Greenland, MD                                 Medical Decision Making Amount and/or Complexity of Data  Reviewed Labs: ordered. Decision-making details documented in ED Course.  Risk Prescription drug management.   Patient presents for suicidal ideation.  Overall patient appears medically stable.  Labs indicate alcohol intoxication, however the patient is able to carry on a conversation.   The patient has been placed in psychiatric observation due to the need to provide a safe environment for the patient while obtaining psychiatric consultation and evaluation, as well as ongoing medical and medication management to treat the patient's condition.  The patient has not been placed under full IVC at this time.         Final Clinical Impression(s) / ED Diagnoses Final diagnoses:  Suicidal ideation    Rx / DC Orders ED Discharge Orders     None         Eldon Greenland, MD 04/03/24 [phone]

## 2024-04-03 NOTE — BH Assessment (Signed)
Clinician messaged Geofrey D. Borreros, RN: "Hey. It's Trey with TTS. Is the pt able to engage in the assessment, if so the pt will need to be placed in a private room. Is the pt under IVC? Also is the pt medically cleared?"    Clinician awaiting response.    Redmond Pulling, MS, [facility] Triage Specialist [phone]

## 2024-04-03 NOTE — ED Notes (Signed)
 RN called over to [facility] told to call back in an hour to give report due to bed changes

## 2024-04-03 NOTE — ED Notes (Signed)
 [facility] NURSE CALLED BACK TO RECEIVE REPORT

## 2024-04-03 NOTE — Progress Notes (Signed)
 Xavier Owens did not attend wrap NA group

## 2024-04-03 NOTE — ED Notes (Addendum)
 Safe transport here to get patient RN called to give report to [facility] RN was told to give call back number for nurse to call back for report.

## 2024-04-03 NOTE — Plan of Care (Signed)
  Problem: Safety: Goal: Periods of time without injury will increase Outcome: Progressing   Problem: Safety: Goal: Periods of time without injury will increase Outcome: Progressing

## 2024-04-03 NOTE — Tx Team (Signed)
 Initial Treatment Plan 04/03/2024 7:36 PM [name] EAV:[id number]    PATIENT STRESSORS: Health problems   Medication change or noncompliance   Substance abuse     PATIENT STRENGTHS: Capable of independent living  Communication skills  Supportive family/friends    PATIENT IDENTIFIED PROBLEMS: Alterations in mood (Anxiety, depression)    Risk for self harm "I was suicidal, I want to just take some pills. I got guns but I wanted to take some pills".    Medication noncompliance "I was off my medicines for 2 weeks. I didn't have my Trazodone  and my Seroquel ".     Alterations in sleep pattern "I have not slept in 3 days because I didn't have my Seroquel  and my Trazodone ".          DISCHARGE CRITERIA:  Improved stabilization in mood, thinking, and/or behavior Verbal commitment to aftercare and medication compliance Withdrawal symptoms are absent or subacute and managed without 24-hour nursing intervention  PRELIMINARY DISCHARGE PLAN: Outpatient therapy Return to previous living arrangement  PATIENT/FAMILY INVOLVEMENT: This treatment plan has been presented to and reviewed with the patient, Xavier Owens. The patient have been given the opportunity to ask questions and make suggestions.  Merrissa Giacobbe, RN 04/03/2024, 7:36 PM

## 2024-04-03 NOTE — Progress Notes (Signed)
 Pt admitted to [facility] under voluntary status from [facility] where he presented after a transfer from [facility].   Per chart review and nursing report; pt presented to [facility] initially with complaint about "My medications are not right. I'm depressed, my 54 year old grandmother passed away in Dec 23, 2023. My daughter is graduating with her Doctorate Degree in June, I need to be there". I get suicidal when my medications are not right. My friend came to visit from [place]  and he took my medications with him. I've been suicidal since Monday I wanted to take some pills". Pt reports, access to lot of guns (pistols, rifles, shot guns, black powder)". Reports he's been off his medications X 2 weeks. Drinks vodka occasionally 0.5-1 pt. BAL 257 on 04/02/24. H/o seizures with last episode be "couple weeks ago, I was drinking with my friends, I fell and was in the ICU" BAL on 03/15/24 was 495.  Pt A & O X4. observed with fair eye contact, flat affect, depressed mood. Speech is logical soft and circumstantial in nature relating to medications not being accurate to what he was previously prescribed. Denies SI, HI, AVH and pain at this time. Reports primary reason for going to [facility] was poor sleep X 3 days "I couldn't sleep for 3 days because I didn't have my Trazodone  and my Seroquel , like I said my friend took them to [place] ". Rates his anxiety and depression both 10/10 related to increased racing thoughts and poor sleep in conjunction with daughter's upcoming graduation and family reunion "I need to be there".    Skin assessment done, ecchymosis (arms) and reddish rash scattered on back, abdomen "I guess it's mosquito bites". Pt's belongings searched, items deemed contraband secured in assigned locker. Ambulatory to unit with slow, steady gait. Unit orientation done, routines discussed, care plan reviewed and admission documents signed. Emotional support, reassurance and encouragement offered. Safety checks initiated at Q 15 minutes  intervals and pt placed on high falls risk related to seizures without incident to note. Lunch tray and fluids offered, tolerated well.

## 2024-04-03 NOTE — ED Notes (Signed)
 Patient refused IV and fluids states his BP is better MD aware.

## 2024-04-03 NOTE — BH Assessment (Signed)
 Comprehensive Clinical Assessment (CCA) Note  04/03/2024 Xavier Owens [id number]  Disposition: Xavier Gauze, NP recommends inpatient treatment. CSW to seek placement. Disposition discussed with Xavier Owens, Charity fundraiser.   The patient demonstrates the following risk factors for suicide: Chronic risk factors for suicide include: psychiatric disorder of Major Depressive Disorder, recurrent, severe and substance use disorder. Acute risk factors for suicide include: Pt is suicidal with a plan. Protective factors for this patient include: positive social support and positive therapeutic relationship. Considering these factors, the overall suicide risk at this point appears to be low. Patient is not appropriate for outpatient follow up.  Xavier Owens is a 53 year old male who presents voluntary and unaccompanied to [facility] Clinician asked the pt, "what brought you to the hospital?" Pt reports, his medications are not right, he's depressed his 73 year old grandmother passed away in 01-10-2024, his daughter is graduating with her Doctorate Degree in June. Pt reports, when his medications are not right he becomes suicidal. Pt reports, he has been suicidal since Monday with a plan to take pills. Pt reports, he has a lot of guns (pistols, rifles, shot guns, black powder). Pt reports, he's hearing soft ringing in his ear (currently.) Pt denies, HI, self-injurious behaviors.   Pt denies, substance use however pt's BAL was 257 at 2304 and UDS is positive for Benzodiazepines. Pt reports, he linked to Dr. Nguyen for medication management, pt was unable to list his medications but states he has not been consistent taking his medications in three weeks. Pt reports, he is in the process of being linked to a therapist. Pt has previous inpatient admissions.  Pt presents alert in scrubs with normal speech and eye contact. Pt's mood was sad, pleasant. Pt's affect was  congruent. Pt insight was fair. Pt's judgement was fair.    Chief Complaint: No chief complaint on file.  Visit Diagnosis: Major Depressive Disorder, recurrent, severe.                              Alcohol use Disorder, severe.    CCA Screening, Triage and Referral (STR)  Patient Reported Information How did you hear about us ? Self  What Is the Reason for Your Visit/Call Today? Pt reports, he is suicidal because he's medications are off, with a plan to take medications.  How Long Has This Been Causing You Problems? > than 6 months  What Do You Feel Would Help You the Most Today? Medication(s); Alcohol or Drug Use Treatment; Stress Management   Have You Recently Had Any Thoughts About Hurting Yourself? Yes  Are You Planning to Commit Suicide/Harm Yourself At This time? Yes   Flowsheet Row ED from 04/02/2024 in [facility] to Hosp-Admission (Discharged) from 03/16/2024 in [facility] (Discharged) [facility]  C-SSRS RISK CATEGORY Low Risk No Risk No Risk       Have you Recently Had Thoughts About Hurting Someone Xavier Owens? No  Are You Planning to Harm Someone at This Time? No  Explanation: NA   Have You Used Any Alcohol or Drugs in the Past 24 Hours? No (Pt denies however pt's BAL was 257 at 2304 and UDS is positive for Benzodiazepines.)  How Long Ago Did You Use Drugs or Alcohol? On Tuesday (01/16/2024)  What Did You Use and How Much? Pt reports,  he drank a fifth of Vodka.   Do You Currently Have a Therapist/Psychiatrist? Yes  Name of Therapist/Psychiatrist: Name of Therapist/Psychiatrist: Pt reports, he linked to Dr. Nguyen for medication management pending therapist.   Have You Been Recently Discharged From Any Office Practice or Programs? No  Explanation of Discharge From Practice/Program: NA    CCA Screening Triage Referral  Assessment Type of Contact: Tele-Assessment  Telemedicine Service Delivery: Telemedicine service delivery: This service was provided via telemedicine using a 2-way, interactive audio and video technology  Is this Initial or Reassessment? Is this Initial or Reassessment?: Initial Assessment  Date Telepsych consult ordered in [facility]:  Date Telepsych consult ordered in [facility]: 04/03/24  Time Telepsych consult ordered in [facility]L:  Time Telepsych consult ordered in [facility]L: 0109  Location of Assessment: [facility]  Provider Location: [facility]   Collateral Involvement: None.   Does Patient Have a Automotive engineer Guardian? No  Legal Guardian Contact Information: Pt is his own guardian.  Copy of Legal Guardianship Form: -- (Pt is his own guardian.)  Legal Guardian Notified of Arrival: -- (Pt is his own guardian.)  Legal Guardian Notified of Pending Discharge: -- (Pt is his own guardian.)  If Minor and Not Living with Parent(s), Who has Custody? Pt is an adult.  Is CPS involved or ever been involved? Never  Is APS involved or ever been involved? Never   Patient Determined To Be At Risk for Harm To Self or Others Based on Review of Patient Reported Information or Presenting Complaint? Yes, for Self-Harm  Method: Plan with intent and identified person  Availability of Means: Has close by  Intent: Intends to cause physical harm but not necessarily death  Notification Required: No need or identified person  Additional Information for Danger to Others Potential: -- (NA)  Additional Comments for Danger to Others Potential: None.  Are There Guns or Other Weapons in Your Home? Yes  Types of Guns/Weapons: Pt reports, he has a lot of guns (pistols, rifles, shot guns, black powder).  Are These Weapons Safely Secured?                            -- (Unsure.)  Who Could Verify You Are Able To Have These Secured: NA  Do You Have any Outstanding Charges, Pending Court Dates,  Parole/Probation? Pt denies.  Contacted To Inform of Risk of Harm To Self or Others: Other: Comment (NA)    Does Patient Present under Involuntary Commitment? No    [place] of Residence: [place]   Patient Currently Receiving the Following Services: Medication Management   Determination of Need: Emergent (2 hours)   Options For Referral: Inpatient Hospitalization; Facility-Based Crisis; Medication Management; Outpatient Therapy     CCA Biopsychosocial Patient Reported Schizophrenia/Schizoaffective Diagnosis in Past: No   Strengths: Pt wants to get his medications addressed.   Mental Health Symptoms Depression:  Increase/decrease in appetite; Sleep (too much or little); Irritability; Hopelessness; Worthlessness   Duration of Depressive symptoms: Duration of Depressive Symptoms: Greater than two weeks   Mania:  None   Anxiety:   Worrying; Restlessness   Psychosis:  -- (Pt reports, hearing ringing in his ear.)   Duration of Psychotic symptoms:    Trauma:  None   Obsessions:  None   Compulsions:  None   Inattention:  Forgetful; Loses things   Hyperactivity/Impulsivity:  Feeling of restlessness   Oppositional/Defiant Behaviors:  Angry   Emotional Irregularity:  Recurrent suicidal behaviors/gestures/threats; Potentially harmful impulsivity   Other Mood/Personality Symptoms:  None.    Mental Status Exam Appearance and self-care  Stature:  Average   Weight:  Average weight   Clothing:  -- (Scrubs.)   Grooming:  Normal   Cosmetic use:  None   Posture/gait:  Other (Comment) (Pt sitting on hospital bed.)   Motor activity:  Tremor   Sensorium  Attention:  Normal   Concentration:  Normal   Orientation:  X5   Recall/memory:  Normal   Affect and Mood  Affect:  Congruent   Mood:  Other (Comment) (Sad, pleasant.)   Relating  Eye contact:  Normal   Facial expression:  Responsive   Attitude toward examiner:  Cooperative   Thought and  Language  Speech flow: Normal   Thought content:  Appropriate to Mood and Circumstances   Preoccupation:  None   Hallucinations:  None   Organization:  Coherent   [employer] of Knowledge:  Fair   Intelligence:  Average   Abstraction:  Functional   Judgement:  Poor   Reality Testing:  Adequate   Insight:  Fair   Decision Making:  Impulsive   Social Functioning  Social Maturity:  Impulsive   Social Judgement:  "Street Smart"   Stress  Stressors:  Grief/losses (Drinking.)   Coping Ability:  Human resources officer Deficits:  Scientist, physiological; Self-control   Supports:  Family     Religion: Religion/Spirituality Are You A Religious Person?: Yes What is Your Religious Affiliation?: Methodist How Might This Affect Treatment?: None.  Leisure/Recreation: Leisure / Recreation Do You Have Hobbies?: Yes Leisure and Hobbies: Fishing, hunting, cooking.  Exercise/Diet: Exercise/Diet Do You Exercise?: Yes What Type of Exercise Do You Do?: Run/Walk, Hiking How Many Times a Week Do You Exercise?: Daily Have You Gained or Lost A Significant Amount of Weight in the Past Six Months?: No Do You Follow a Special Diet?: No Do You Have Any Trouble Sleeping?: Yes Explanation of Sleeping Difficulties: Pt reports, he's been up for 48 hours.   CCA Employment/Education Employment/Work Situation: Employment / Work Situation Employment Situation: Unemployed Patient's Job has Been Impacted by Current Illness: No Has Patient ever Been in Equities trader?: Yes (Describe in comment) (Pt reports, he was in the [employer] for 10 years.) Did You Receive Any Psychiatric Treatment/Services While in the [employer]?: No  Education: Education Is Patient Currently Attending School?: No Last Grade Completed: 12 Did You Product manager?: No Did You Have An Individualized Education Program (IIEP): No Did You Have Any Difficulty At School?: No Patient's Education Has Been Impacted by  Current Illness: No   CCA Family/Childhood History Family and Relationship History: Family history Marital status: Single Does patient have children?: Yes How many children?: 2 How is patient's relationship with their children?: Pt reports, he has two daughters, one of his daughter is graduating with her Doctorate Degree in June.  Childhood History:  Childhood History By whom was/is the patient raised?: Mother Did patient suffer any verbal/emotional/physical/sexual abuse as a child?: No Did patient suffer from severe childhood neglect?: No Has patient ever been sexually abused/assaulted/raped as an adolescent or adult?: No Was the patient ever a victim of a crime or a disaster?: No Witnessed domestic violence?: No Has patient been affected by domestic violence as an adult?: No   CCA Substance Use Alcohol/Drug Use: Alcohol / Drug Use Pain Medications: See MAR Prescriptions: See MAR Over the Counter: See MAR History of alcohol / drug use?:  Yes Longest period of sobriety (when/how long): Unsure. Negative Consequences of Use:  (Unsure.) Withdrawal Symptoms: None    ASAM's:  Six Dimensions of Multidimensional Assessment  Dimension 1:  Acute Intoxication and/or Withdrawal Potential:   Dimension 1:  Description of individual's past and current experiences of substance use and withdrawal: None.  Dimension 2:  Biomedical Conditions and Complications:   Dimension 2:  Description of patient's biomedical conditions and  complications: Per chart, pt has a previous diagnosis of: Subclinical hypothyroidism. Pt reports, he was hospitalized for a few weeks ago for seizures.  Dimension 3:  Emotional, Behavioral, or Cognitive Conditions and Complications:  Dimension 3:  Description of emotional, behavioral, or cognitive conditions and complications: Per chart, pt has a previous diagnosis of: Alcohol use disorder, severe, dependence (HCC) and Substance induced mood disorder (HCC).  Dimension 4:   Readiness to Change:  Dimension 4:  Description of Readiness to Change criteria: Pt denies, substance use.  Dimension 5:  Relapse, Continued use, or Continued Problem Potential:  Dimension 5:  Relapse, continued use, or continued problem potential critiera description: During the assessment, pt denies substance use however his BAL was 257 at 2304 on 04/02/2024, pt's UDS was positive for Benzodiazepines.  Dimension 6:  Recovery/Living Environment:  Dimension 6:  Recovery/Iiving environment criteria description: Pt reports, he lives alone in [place].  ASAM Severity Score: ASAM's Severity Rating Score: 7  ASAM Recommended Level of Treatment: ASAM Recommended Level of Treatment: Level I Outpatient Treatment   Substance use Disorder (SUD) Substance Use Disorder (SUD)  Checklist Symptoms of Substance Use: Continued use despite persistent or recurrent social, interpersonal problems, caused or exacerbated by use, Evidence of withdrawal (Comment), Presence of craving or strong urge to use, Persistent desire or unsuccessful efforts to cut down or control use, Continued use despite having a persistent/recurrent physical/psychological problem caused/exacerbated by use (Clinician observed tremors.)  Recommendations for Services/Supports/Treatments: Recommendations for Services/Supports/Treatments Recommendations For Services/Supports/Treatments: Inpatient Hospitalization, Individual Therapy, Medication Management, Facility Based Crisis  Disposition Recommendation per psychiatric provider: We recommend inpatient psychiatric hospitalization when medically cleared. Patient is under voluntary admission status at this time; please IVC if attempts to leave hospital.   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   Dehydration 03/21/2024   Acute lactic  acidosis 03/21/2024   Delirium tremens (HCC) 03/18/2024   Nausea & vomiting 03/16/2024   Alcohol abuse 03/16/2024   Tremor 03/16/2024   Neuropathy 03/16/2024   Mood disorder (HCC) 03/16/2024   Memory impairment 02/08/2024   Right knee pain 02/08/2024   Major depressive disorder, recurrent severe without psychotic features (HCC) 01/25/2024   Alcohol withdrawal seizure (HCC) 01/23/2024   Alcohol use disorder, severe, dependence (HCC) 01/18/2024   Healthcare maintenance 06/10/2022   Nightmares 12/21/2021   Subclinical hypothyroidism 12/21/2021   Substance induced mood disorder (HCC) 12/14/2021   Alcohol use disorder 06/23/2021   Seizures (HCC) 06/22/2021   Elevated liver enzymes 04/19/2016   Essential hypertension 04/19/2016     Referrals to Alternative Service(s): Referred to Alternative Service(s):   Place:   Date:   Time:    Referred to Alternative Service(s):   Place:   Date:   Time:    Referred to Alternative Service(s):   Place:   Date:   Time:    Referred to Alternative Service(s):   Place:   Date:   Time:  Rosi Converse, [facility] Comprehensive Clinical Assessment (CCA) Screening, Triage and Referral Note  04/03/2024 Dontavion Noxon [id number]  Chief Complaint: No chief complaint on file.  Visit Diagnosis:   Patient Reported Information How did you hear about us ? Self  What Is the Reason for Your Visit/Call Today? Pt reports, he is suicidal because he's medications are off, with a plan to take medications.  How Long Has This Been Causing You Problems? > than 6 months  What Do You Feel Would Help You the Most Today? Medication(s); Alcohol or Drug Use Treatment; Stress Management   Have You Recently Had Any Thoughts About Hurting Yourself? Yes  Are You Planning to Commit Suicide/Harm Yourself At This time? Yes   Have you Recently Had Thoughts About Hurting Someone Xavier Owens? No  Are You Planning to Harm Someone at This Time? No  Explanation: NA   Have You  Used Any Alcohol or Drugs in the Past 24 Hours? No (Pt denies however pt's BAL was 257 at 2304 and UDS is positive for Benzodiazepines.)  How Long Ago Did You Use Drugs or Alcohol? On Tuesday (01/16/2024)  What Did You Use and How Much? Pt reports, he drank a fifth of Vodka.   Do You Currently Have a Therapist/Psychiatrist? Yes  Name of Therapist/Psychiatrist: Pt reports, he linked to Dr. Nguyen for medication management pending therapist.   Have You Been Recently Discharged From Any Office Practice or Programs? No  Explanation of Discharge From Practice/Program: NA   CCA Screening Triage Referral Assessment Type of Contact: Tele-Assessment  Telemedicine Service Delivery: Telemedicine service delivery: This service was provided via telemedicine using a 2-way, interactive audio and video technology  Is this Initial or Reassessment? Is this Initial or Reassessment?: Initial Assessment  Date Telepsych consult ordered in [facility]:  Date Telepsych consult ordered in [facility]L: 04/03/24  Time Telepsych consult ordered in [facility]L:  Time Telepsych consult ordered in [facility]L: 0109  Location of Assessment: [facility]  Provider Location: [facility] Assessment Services    Collateral Involvement: None.   Does Patient Have a Automotive engineer Guardian? No. Name and Contact of Legal Guardian: Pt is his own guardian. If Minor and Not Living with Parent(s), Who has Custody? Pt is an adult.  Is CPS involved or ever been involved? Never  Is APS involved or ever been involved? Never   Patient Determined To Be At Risk for Harm To Self or Others Based on Review of Patient Reported Information or Presenting Complaint? Yes, for Self-Harm  Method: Plan with intent and identified person  Availability of Means: Has close by  Intent: Intends to cause physical harm but not necessarily death  Notification Required: No need or identified person  Additional Information for Danger to Others Potential: --  (NA)  Additional Comments for Danger to Others Potential: None.  Are There Guns or Other Weapons in Your Home? Yes  Types of Guns/Weapons: Pt reports, he has a lot of guns (pistols, rifles, shot guns, black powder).  Are These Weapons Safely Secured?                            -- (Unsure.)  Who Could Verify You Are Able To Have These Secured: NA  Do You Have any Outstanding Charges, Pending Court Dates, Parole/Probation? Pt denies.  Contacted To Inform of Risk of Harm To Self or Others: Other: Comment (NA)   Does Patient Present under Involuntary Commitment? No    [place]  of Residence: [place]   Patient Currently Receiving the Following Services: Medication Management   Determination of Need: Emergent (2 hours)   Options For Referral: Inpatient Hospitalization; Facility-Based Crisis; Medication Management; Outpatient Therapy   Disposition Recommendation per psychiatric provider: We recommend inpatient psychiatric hospitalization when medically cleared. Patient is under voluntary admission status at this time; please IVC if attempts to leave hospital.  Rosi Converse, [facility]    Rosi Converse, MS, [facility], [facility] Triage Specialist [phone]

## 2024-04-03 NOTE — ED Notes (Signed)
 Pt has one belongings bag that contains his shoes, clothes, and a bag of medicine. His bags has been labeled and placed in the pt belongings cabinet.

## 2024-04-03 NOTE — ED Notes (Signed)
 RN attempted to call report no answer RN will try again. RN called safe transport to have patient taken to [facility].

## 2024-04-03 NOTE — Progress Notes (Signed)
 Pt has been accepted to [facility] on 04/03/2024 Bed assignment: 407-2  Pt meets inpatient criteria per: Dorthea Gauze NP  Attending Physician will be: .DR. Zouev   Report can be called to: Adult unit: [phone]  Pt can arrive after discharges   Care Team Notified: [facility] Kathryn Parish RN, Lady Pier NP, Lenox Raider Brunson RN   [place] Kasara Schomer LCSW-A   04/03/2024 9:37 AM

## 2024-04-04 DIAGNOSIS — F332 Major depressive disorder, recurrent severe without psychotic features: Secondary | ICD-10-CM | POA: Diagnosis not present

## 2024-04-04 LAB — BASIC METABOLIC PANEL WITH GFR
Anion gap: 11 (ref 5–15)
BUN: 6 mg/dL (ref 6–20)
CO2: 22 mmol/L (ref 22–32)
Calcium: 9.1 mg/dL (ref 8.9–10.3)
Chloride: 102 mmol/L (ref 98–111)
Creatinine, Ser: 0.74 mg/dL (ref 0.61–1.24)
GFR, Estimated: >60 mL/min (ref >60)
Glucose, Bld: 109 mg/dL — ABNORMAL HIGH (ref 70–99)
Potassium: 3.8 mmol/L (ref 3.5–5.1)
Sodium: 135 mmol/L (ref 135–145)

## 2024-04-04 LAB — LIPID PANEL
Cholesterol: 169 mg/dL (ref 0–200)
HDL: 56 mg/dL (ref >40)
LDL Cholesterol: 74 mg/dL (ref 0–99)
Total CHOL/HDL Ratio: 3 ratio
Triglycerides: 195 mg/dL — ABNORMAL HIGH (ref <150)
VLDL: 39 mg/dL (ref 0–40)

## 2024-04-04 MED ORDER — VITAMIN B-1 100 MG PO TABS
100.0000 mg | ORAL_TABLET | Freq: Every day | ORAL | Status: DC
  Administered 2024-04-05 – 2024-04-09 (×5): 100 mg via ORAL
  Filled 2024-04-04 (×3): qty 1

## 2024-04-04 MED ORDER — LORAZEPAM 1 MG PO TABS
1.0000 mg | ORAL_TABLET | Freq: Every day | ORAL | Status: AC
  Administered 2024-04-07: 1 mg via ORAL
  Filled 2024-04-04: qty 1

## 2024-04-04 MED ORDER — POTASSIUM CHLORIDE CRYS ER 20 MEQ PO TBCR
40.0000 meq | EXTENDED_RELEASE_TABLET | Freq: Once | ORAL | Status: AC
  Administered 2024-04-04: 40 meq via ORAL
  Filled 2024-04-04: qty 2

## 2024-04-04 MED ORDER — LORAZEPAM 1 MG PO TABS
1.0000 mg | ORAL_TABLET | Freq: Four times a day (QID) | ORAL | Status: AC
  Administered 2024-04-04 (×2): 1 mg via ORAL
  Filled 2024-04-04 (×2): qty 1

## 2024-04-04 MED ORDER — ADULT MULTIVITAMIN W/MINERALS CH
1.0000 | ORAL_TABLET | Freq: Every day | ORAL | Status: DC
  Administered 2024-04-04 – 2024-04-12 (×9): 1 via ORAL
  Filled 2024-04-04 (×9): qty 1

## 2024-04-04 MED ORDER — LORAZEPAM 1 MG PO TABS
1.0000 mg | ORAL_TABLET | Freq: Two times a day (BID) | ORAL | Status: AC
  Administered 2024-04-06 (×2): 1 mg via ORAL
  Filled 2024-04-04 (×2): qty 1

## 2024-04-04 MED ORDER — THIAMINE HCL 100 MG/ML IJ SOLN: 100.0000 mg | Freq: Once | INTRAMUSCULAR | Status: DC

## 2024-04-04 MED ORDER — LORAZEPAM 1 MG PO TABS
1.0000 mg | ORAL_TABLET | Freq: Four times a day (QID) | ORAL | Status: AC | PRN
  Administered 2024-04-07: 1 mg via ORAL
  Filled 2024-04-04: qty 1

## 2024-04-04 MED ORDER — LOPERAMIDE HCL 2 MG PO CAPS: 2.0000 mg | ORAL_CAPSULE | ORAL | Status: AC | PRN

## 2024-04-04 MED ORDER — LORAZEPAM 1 MG PO TABS
1.0000 mg | ORAL_TABLET | Freq: Three times a day (TID) | ORAL | Status: AC
  Administered 2024-04-05 (×3): 1 mg via ORAL
  Filled 2024-04-04 (×3): qty 1

## 2024-04-04 MED ORDER — HYDROXYZINE HCL 25 MG PO TABS: 25.0000 mg | ORAL_TABLET | Freq: Four times a day (QID) | ORAL | Status: AC | PRN

## 2024-04-04 MED ORDER — ONDANSETRON 4 MG PO TBDP: 4.0000 mg | ORAL_TABLET | Freq: Four times a day (QID) | ORAL | Status: AC | PRN

## 2024-04-04 MED ORDER — VITAMIN B-1 100 MG PO TABS
100.0000 mg | ORAL_TABLET | Freq: Every day | ORAL | Status: DC
  Administered 2024-04-07 – 2024-04-12 (×4): 100 mg via ORAL
  Filled 2024-04-04 (×8): qty 1

## 2024-04-04 NOTE — [facility] Counselor (Signed)
 Adult Comprehensive Assessment  Patient ID: Xavier Owens, male   DOB: 05/21/1971, 53 y.o.   MRN: [id number]  Information Source: Information source: Patient  Current Stressors:  Patient states their primary concerns and needs for treatment are:: "to get some help" Patient states their goals for this hospitilization and ongoing recovery are:: "I don't know now" Educational / Learning stressors: denies Employment / Job issues: unemployed Family Relationships: my daughter is in [place] and about to graduate, she doesn't need to knowEngineer, petroleum / Lack of resources (include bankruptcy): denies Housing / Lack of housing: "I have a place to live" Physical health (include injuries & life threatening diseases): denies Social relationships: "I have a fiance" Substance abuse: denies Bereavement / Loss: denies  Living/Environment/Situation:  Living Arrangements: Alone Who else lives in the home?: no one How long has patient lived in current situation?: "long time" What is atmosphere in current home: Comfortable  Family History:  Marital status: Long term relationship Long term relationship, how long?: "long" What types of issues is patient dealing with in the relationship?: Denies Additional relationship information: Pt's chart states he is single and his only contact listed is his father Are you sexually active?: No What is your sexual orientation?: "heterosexual" Has your sexual activity been affected by drugs, alcohol, medication, or emotional stress?: "no" Does patient have children?: Yes How many children?: 2 How is patient's relationship with their children?: Pt reports, he has two daughters, one of his daughter is graduating with her Doctorate Degree in June.  Childhood History:  By whom was/is the patient raised?: Mother Additional childhood history information: Pt reports he was raised by his grandmother, aunt, dad and stepmother Description of patient's relationship with caregiver  when they were a child: Pt reports they have a good relationship How were you disciplined when you got in trouble as a child/adolescent?: Pt reports spanking Did patient suffer any verbal/emotional/physical/sexual abuse as a child?: No Did patient suffer from severe childhood neglect?: No Has patient ever been sexually abused/assaulted/raped as an adolescent or adult?: No Was the patient ever a victim of a crime or a disaster?: No Witnessed domestic violence?: No Has patient been affected by domestic violence as an adult?: No  Education:  Currently a Consulting civil engineer?: No Learning disability?: No  Employment/Work Situation:   Employment Situation: Unemployed Patient's Job has Been Impacted by Current Illness: No What is the Longest Time Patient has Held a Job?: 26 years 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?: Yes (Describe in comment) Did You Receive Any Psychiatric Treatment/Services While in the Military?: No  Financial Resources:      Alcohol/Substance Abuse:   What has been your use of drugs/alcohol within the last 12 months?: denies  Social Support System:   [employer] Support System: None  Leisure/Recreation:   Do You Have Hobbies?: Yes Leisure and Hobbies: Therapist, music, hunting, cooking.  Strengths/Needs:      Discharge Plan:   Currently receiving community mental health services: No Does patient have access to transportation?: No Does patient have financial barriers related to discharge medications?: No Patient description of barriers related to discharge medications: does not have insurance Plan for no access to transportation at discharge: reports no transportation Will patient be returning to same living situation after discharge?: Yes  Summary/Recommendations:   Summary and Recommendations (to be completed by the evaluator): Xavier Owens is a 53 year old male that was admitted to [facility]  on5/21/25  due to worsening of his depressive diagnosis.  Patient was not open to speaking to the assessor.  Pt reports, his medications are not right, he's depressed his 93 year old grandmother passed away in 12/28/23, his daughter is graduating with her Doctorate Degree in June. Pt reports, when his medications are not right he becomes suicidal. Pt reports, he has been suicidal since Monday with a plan to take pills. Pt reports, he has a lot of guns (pistols, rifles, shot guns, black powder). Pt reports, he's hearing soft ringing in his ear (currently.) Pt denies, HI, self-injurious behaviors. While here, Xavier Owens can benefit from crisis stabilization, medication management, therapeutic milieu, and referrals for services.   Xavier Owens. 04/04/2024

## 2024-04-04 NOTE — H&P (Signed)
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 BAL 257 and not taking his psychotropic medications x 3 weeks.    MDD (major depressive disorder)  Plan: Medications: -- Continue Cymbalta  DR capsule 60 mg p.o. daily for depression and pain --Continue naltrexone  tablet 50 mg p.o. daily for alcohol use disorder -- Continue Seroquel  tablet 100 mg p.o. daily at bedtime for mood disorder -- Trazodone  tablet 100 mg p.o. q. nightly as needed for insomnia --- Continue hydroxyzine  25 mg p.o. 3 times daily as needed for anxiety -- Gabapentin  600 mg p.o. 3 times daily for anxiety  Seizures precaution  Medication for other medical problems: --Cholecalciferol  tablets 5000 units p.o. daily to supplement -- Voltaren  1% topical gel 2 g to right knee 4  times daily -- Iron tablets 2025 mg p.o. daily at bedtime for anemia -- Protonix  EC tablet 40 mg p.o. daily for GERD -- Propranolol  tablets 10 mg p.o. twice daily for HTN -- Thiamine  Tablet 100 mg p.o. daily for deficiency  Ativan  detox protocol: See MAR  Other PRN Medications  -Acetaminophen  650 mg every 6 as needed/mild pain  -Maalox 30 mL oral every 4 as needed/digestion  -Magnesium  hydroxide 30 mL daily as needed/mild constipation   --The risks/benefits/side-effects/alternatives to this medication were discussed in detail with the patient and time was given for questions. The patient consents to medication trial.   -- Metabolic profile and EKG monitoring obtained while on an atypical antipsychotic (BMI: Lipid Panel: HbgA1c: QTc:)   -- Encouraged patient to participate in unit milieu and in scheduled group therapies    Continue BH Agitation Protocol  --Haldol  5 mg, oral, 3 times daily as needed, mild agitation  --Benadryl  50 mg, oral, 3  times daily as needed, mild agitation                                      OR   --Haldol  injection 5 mg, IM, 3 times daily as needed, moderate agitation  --Benadryl  injection 50 mg, IM, 3 times daily as needed, moderate agitation  --Ativan  injection 2 mg, IM, 3 times daily as needed, moderate agitation                                      OR  --Haldol  injection 10 mg, IM, 3 times daily as needed, severe agitation  --Benadryl  injection 50 mg, IM, 3 times daily as needed, severe agitation  --Ativan  injection 2 mg, IM, 3 times daily as needed, severe agitation   Admission labs reviewed: CMP: Potassium level 3.3 low, BUN less than 5 low, otherwise normal.  CBC with differential: Hemoglobin 12.7 low, platelets elevated, otherwise normal.  BAL 257.  UDS: Positive for benzos.  New labs ordered: BMP  EKG reviewed: Normal sinus rhythm with sinus arrhythmias, ventricular rate 77, QT/QTc 392/443  Safety and Monitoring:  Voluntary admission to inpatient  psychiatric unit for safety, stabilization and treatment  Daily contact with patient to assess and evaluate symptoms and progress in treatment  Patient's case to be discussed in multi-disciplinary team meeting  Observation Level : q15 minute checks  Vital signs: q12 hours  Precautions: suicide, but pt currently verbally contracts for safety on unit?   Discharge Planning:  Social work and case management to assist with discharge planning and identification of hospital follow-up needs prior to discharge  Estimated LOS: 5-7?days   Discharge Concerns: Need to establish a safety plan; Medication compliance and effectiveness   Discharge Goals: Return home with outpatient referrals for mental health follow-up including medication management/psychotherapy.   Long Term Goal(s): Improvement in symptoms so as ready for discharge  Short Term Goals: Ability to identify changes in lifestyle to reduce recurrence of condition will improve, Ability to verbalize feelings will improve, Ability to disclose and discuss suicidal ideas, Ability to demonstrate self-control will improve, Ability to identify and develop effective coping behaviors will improve, Ability to maintain clinical measurements within normal limits will improve, Compliance with prescribed medications will improve, and Ability to identify triggers associated with substance abuse/mental health issues will improve  Physician Treatment Plan for Secondary Diagnosis: Principal Problem:   MDD (major depressive disorder)  I certify that inpatient services furnished can reasonably be expected to improve the patient's condition.    Laurence Pons, FNP 5/22/20253:16 PM

## 2024-04-04 NOTE — Group Note (Signed)
 LCSW Group Therapy Note   Group Date: 04/04/2024 Start Time: 1100 End Time: 1200   Participation:  did not attend  Type of Therapy:  Group Therapy  Topic:  Shining from Within:  Confidence and Self-love Journey  Objective:  To support participants in developing confidence and self-love through self-awareness, self-compassion, and practical skills that nurture personal growth.   Group Goals Encourage self-reflection and self-acceptance by identifying personal strengths and achievements. Teach skills to challenge negative self-talk and replace it with supportive, truthful self-talk. Foster resilience and self-worth through [employer], gratitude, and self-care practices.   Summary:  This group explores the connection between confidence and self-love by guiding participants through reflection, mindset shifts, and practical tools like affirmations, strength recognition, and goal-setting. Activities are designed to promote self-compassion, build emotional resilience, and normalize the slow, patient journey of inner growth.   Therapeutic Modalities Used: Elements of Cognitive Behavioral Therapy (CBT): Challenging and reframing unhelpful self-talk. Elements of Motivational Interviewing (MI): Encouraging small, achievable goals. Elements of Dialectical Behavioral Therapist (DBT):  Mindfulness and Self-Compassion: Promoting present-moment awareness and kindness toward self.   Joely Losier O Danel Studzinski, LCSWA 04/04/2024  12:11 PM

## 2024-04-04 NOTE — [facility] Suicide Risk Assessment (Signed)
 Suicide Risk Assessment  Admission Assessment    [facility] Admission Suicide Risk Assessment   Nursing information obtained from:  Patient Demographic factors:  Male, Living alone, Unemployed Current Mental Status:  Suicidal ideation indicated by patient, Self-harm thoughts (Prior to admission to [facility]) Loss Factors:  Decrease in vocational status, Decline in physical health (Arthritis, HTN) Historical Factors:  Impulsivity Risk Reduction Factors:  Sense of responsibility to family, Responsible for children under 17 years of age, Positive social support  Total Time spent with patient: 45 minutes Principal Problem: MDD (major depressive disorder) Diagnosis:  Principal Problem:   MDD (major depressive disorder) Substance-induced mood disorder Alcohol use disorder Seizures disorder  Subjective Data: Xavier Owens is a 53 y.o. AA male with prior psychiatric history significant for MDD, alcohol use disorder and, substance induced mood disorder, alcohol withdrawal seizures, memory impairment, essential tremors, and neuropathy.  PMHx include seizures disorder, chronic right knee pain, subclinical hypothyroidism and essential hypertension.  Patient presents involuntarily to [facility] from [facility] ED for worsening depression resulting in suicidal ideation in the context of alcohol intoxication, BAL 257 and not taking his psychotropic medications x 3 weeks.    Continued Clinical Symptoms:  Alcohol Use Disorder Identification Test Final Score (AUDIT): 10 The "Alcohol Use Disorders Identification Test", Guidelines for Use in Primary Care, Second Edition.  World Science writer [facility]). Score between 0-7:  no or low risk or alcohol related problems. Score between 8-15:  moderate risk of alcohol related problems. Score between 16-19:  high risk of alcohol related problems. Score 20 or above:  warrants further diagnostic evaluation for alcohol dependence and  treatment.   CLINICAL FACTORS:   Depression:   Anhedonia Hopelessness Impulsivity Severe Alcohol/Substance Abuse/Dependencies More than one psychiatric diagnosis Previous Psychiatric Diagnoses and Treatments Medical Diagnoses and Treatments/Surgeries   Musculoskeletal: Strength & Muscle Tone: within normal limits Gait & Station: normal Patient leans: N/A  Psychiatric Specialty Exam:  Presentation  General Appearance:  Appropriate for Environment; Casual; Fairly Groomed  Eye Contact: Good  Speech: Clear and Coherent  Speech Volume: Normal  Handedness: Right  Mood and Affect  Mood: Anxious; Depressed  Affect: Congruent  Thought Process  Thought Processes: Coherent; Linear  Descriptions of Associations:Intact  Orientation:Full (Time, Place and Person)  Thought Content:Logical  History of Schizophrenia/Schizoaffective disorder:No  Duration of Psychotic Symptoms:No data recorded Hallucinations:Hallucinations: None  Ideas of Reference:None  Suicidal Thoughts:Suicidal Thoughts: No  Homicidal Thoughts:Homicidal Thoughts: No  Sensorium  Memory: Immediate Good; Recent Good  Judgment: Fair  Insight: Fair  Art therapist  Concentration: Good  Attention Span: Good  Recall: Good  Fund of Knowledge: Fair  Language: Good  Psychomotor Activity  Psychomotor Activity: Psychomotor Activity: Normal  Assets  Assets: Communication Skills; Physical Health; Resilience  Sleep  Sleep: Sleep: Good Number of Hours of Sleep: 9.75  Physical Exam: Physical Exam Vitals and nursing note reviewed.  Constitutional:      General: He is not in acute distress.    Appearance: He is not toxic-appearing.  HENT:     Head: Normocephalic.     Right Ear: External ear normal.     Left Ear: External ear normal.     Nose: Nose normal.     Mouth/Throat:     Mouth: Mucous membranes are moist.     Pharynx: Oropharynx is clear.  Eyes:      Extraocular Movements: Extraocular movements intact.  Cardiovascular:     Rate and Rhythm: Normal rate.  Pulses: Normal pulses.  Pulmonary:     Effort: Pulmonary effort is normal.  Abdominal:     Comments: Deferred  Genitourinary:    Comments: Deferred Musculoskeletal:        General: Normal range of motion.     Cervical back: Normal range of motion.  Skin:    General: Skin is warm.  Neurological:     General: No focal deficit present.     Mental Status: He is alert and oriented to person, place, and time.  Psychiatric:        Mood and Affect: Mood normal.        Behavior: Behavior normal.    Review of Systems  Constitutional:  Negative for chills and fever.  HENT:  Negative for sore throat.   Eyes:  Negative for blurred vision.  Respiratory:  Negative for cough, sputum production, shortness of breath and wheezing.   Cardiovascular:  Negative for chest pain and palpitations.  Gastrointestinal:  Negative for heartburn, nausea and vomiting.  Genitourinary:  Negative for dysuria, frequency and urgency.  Musculoskeletal:  Negative for falls.       History of chronic right knee pain  Skin:  Negative for itching and rash.  Neurological:  Negative for dizziness and headaches.  Endo/Heme/Allergies:        See allergy listing  Psychiatric/Behavioral:  Positive for depression. Negative for hallucinations, substance abuse and suicidal ideas. The patient is nervous/anxious. The patient does not have insomnia.    Blood pressure 113/81, pulse 75, temperature 98 F (36.7 C), temperature source Oral, resp. rate 18, height 5\' 8"  (1.727 m), weight 73.5 kg, SpO2 96%. Body mass index is 24.63 kg/m.   COGNITIVE FEATURES THAT CONTRIBUTE TO RISK:  Polarized thinking    SUICIDE RISK:   Severe:  Frequent, intense, and enduring suicidal ideation, specific plan, no subjective intent, but some objective markers of intent (i.e., choice of lethal method), the method is accessible, some limited  preparatory behavior, evidence of impaired self-control, severe dysphoria/symptomatology, multiple risk factors present, and few if any protective factors, particularly a lack of social support.  PLAN OF CARE: Treatment Plan Summary: Daily contact with patient to assess and evaluate symptoms and progress in treatment and Medication management  Physician Treatment Plan for Primary Diagnosis:  Assessment: Xavier Owens is a 53 y.o. AA male with prior psychiatric history significant for MDD, alcohol use disorder and, substance induced mood disorder, alcohol withdrawal seizures, memory impairment, essential tremors, and neuropathy.  PMHx include seizures disorder, chronic right knee pain, subclinical hypothyroidism and essential hypertension.  Patient presents involuntarily to [facility] from [facility] ED for worsening depression resulting in suicidal ideation in the context of alcohol intoxication, BAL 257 and not taking his psychotropic medications x 3 weeks.    MDD (major depressive disorder)  Plan: Medications: -- Continue Cymbalta  DR capsule 60 mg p.o. daily for depression and pain --Continue naltrexone  tablet 50 mg p.o. daily for alcohol use disorder -- Continue Seroquel  tablet 100 mg p.o. daily at bedtime for mood disorder -- Trazodone  tablet 100 mg p.o. q. nightly as needed for insomnia --- Continue hydroxyzine  25 mg p.o. 3 times daily as needed for anxiety -- Gabapentin  600 mg p.o. 3 times daily for anxiety  Seizures precaution  Medication for other medical problems: --Cholecalciferol  tablets 5000 units p.o. daily to supplement -- Voltaren  1% topical gel 2 g to right knee 4 times daily -- Iron tablets 2025 mg p.o. daily at bedtime for anemia --  Protonix  EC tablet 40 mg p.o. daily for GERD -- Propranolol  tablets 10 mg p.o. twice daily for HTN -- Thiamine  Tablet 100 mg p.o. daily for deficiency  Ativan  detox protocol: See MAR  Other PRN Medications   -Acetaminophen  650 mg every 6 as needed/mild pain  -Maalox 30 mL oral every 4 as needed/digestion  -Magnesium  hydroxide 30 mL daily as needed/mild constipation   --The risks/benefits/side-effects/alternatives to this medication were discussed in detail with the patient and time was given for questions. The patient consents to medication trial.   -- Metabolic profile and EKG monitoring obtained while on an atypical antipsychotic (BMI: Lipid Panel: HbgA1c: QTc:)   -- Encouraged patient to participate in unit milieu and in scheduled group therapies    Continue BH Agitation Protocol  --Haldol  5 mg, oral, 3 times daily as needed, mild agitation  --Benadryl  50 mg, oral, 3 times daily as needed, mild agitation                                      OR   --Haldol  injection 5 mg, IM, 3 times daily as needed, moderate agitation  --Benadryl  injection 50 mg, IM, 3 times daily as needed, moderate agitation  --Ativan  injection 2 mg, IM, 3 times daily as needed, moderate agitation                                      OR  --Haldol  injection 10 mg, IM, 3 times daily as needed, severe agitation  --Benadryl  injection 50 mg, IM, 3 times daily as needed, severe agitation  --Ativan  injection 2 mg, IM, 3 times daily as needed, severe agitation   Admission labs reviewed: CMP: Potassium level 3.3 low, BUN less than 5 low, otherwise normal.  CBC with differential: Hemoglobin 12.7 low, platelets elevated, otherwise normal.  BAL 257.  UDS: Positive for benzos.  New labs ordered: BMP  EKG reviewed: Normal sinus rhythm with sinus arrhythmias, ventricular rate 77, QT/QTc 392/443  Safety and Monitoring:  Voluntary admission to inpatient psychiatric unit for safety, stabilization and treatment  Daily contact with patient to assess and evaluate symptoms and progress in treatment  Patient's case to be discussed in multi-disciplinary team meeting  Observation Level : q15 minute checks  Vital signs: q12 hours   Precautions: suicide, but pt currently verbally contracts for safety on unit?   Discharge Planning:  Social work and case management to assist with discharge planning and identification of hospital follow-up needs prior to discharge  Estimated LOS: 5-7?days   Discharge Concerns: Need to establish a safety plan; Medication compliance and effectiveness   Discharge Goals: Return home with outpatient referrals for mental health follow-up including medication management/psychotherapy.   Long Term Goal(s): Improvement in symptoms so as ready for discharge  Short Term Goals: Ability to identify changes in lifestyle to reduce recurrence of condition will improve, Ability to verbalize feelings will improve, Ability to disclose and discuss suicidal ideas, Ability to demonstrate self-control will improve, Ability to identify and develop effective coping behaviors will improve, Ability to maintain clinical measurements within normal limits will improve, Compliance with prescribed medications will improve, and Ability to identify triggers associated with substance abuse/mental health issues will improve  Physician Treatment Plan for Secondary Diagnosis: Principal Problem:   MDD (major depressive disorder)  I certify that inpatient services furnished  can reasonably be expected to improve the patient's condition.   Laurence Pons, FNP 04/04/2024, 3:11 PM

## 2024-04-04 NOTE — Group Note (Signed)
 Date:  04/04/2024 Time:  8:47 PM  Group Topic/Focus:  Wrap-Up Group:   The focus of this group is to help patients review their daily goal of treatment and discuss progress on daily workbooks.    Participation Level:  Did Not Attend  Participation Quality:  N/A  Affect:  N/A  Cognitive:  N/A  Insight: None  Engagement in Group:  N/A  Modes of Intervention:  N/A  Additional Comments:  Patient did not attend wrap up group.   Dillard Frame 04/04/2024, 8:47 PM

## 2024-04-04 NOTE — Progress Notes (Signed)
   04/04/24 0900  Psychosocial Assessment  Patient Complaints Depression;Anxiety  Eye Contact None  Facial Expression Other (Comment) (face covered with blanket at time of assessment)  Affect Depressed  Speech Logical/coherent  Interaction Cautious  Motor Activity Slow  Appearance/Hygiene Disheveled  Behavior Characteristics Anxious;Cooperative  Mood Depressed;Anxious  Thought Process  Coherency WDL  Content WDL  Delusions None reported or observed  Perception WDL  Hallucination None reported or observed  Judgment Impaired  Confusion None  Danger to Self  Current suicidal ideation? Denies  Danger to Others  Danger to Others None reported or observed

## 2024-04-04 NOTE — Plan of Care (Signed)
  Problem: Education: Goal: Emotional status will improve Outcome: Progressing Goal: Verbalization of understanding the information provided will improve Outcome: Progressing   Problem: Activity: Goal: Interest or engagement in activities will improve Outcome: Progressing Goal: Sleeping patterns will improve Outcome: Progressing   Problem: Coping: Goal: Ability to verbalize frustrations and anger appropriately will improve Outcome: Progressing

## 2024-04-04 NOTE — Plan of Care (Signed)

## 2024-04-05 ENCOUNTER — Encounter (HOSPITAL_COMMUNITY): Payer: Self-pay

## 2024-04-05 DIAGNOSIS — F332 Major depressive disorder, recurrent severe without psychotic features: Secondary | ICD-10-CM | POA: Diagnosis not present

## 2024-04-05 NOTE — BH IP Treatment Plan (Signed)
 Interdisciplinary Treatment and Diagnostic Plan Update  04/05/2024 Time of Session: 10:45 AM Xavier Owens MRN: [id number]  Principal Diagnosis: MDD (major depressive disorder)  Secondary Diagnoses: Principal Problem:   MDD (major depressive disorder)   Current Medications:  Current Facility-Administered Medications  Medication Dose Route Frequency Provider Last Rate Last Admin   acetaminophen  (TYLENOL ) tablet 650 mg  650 mg Oral Q6H PRN Weber, Kyra A, NP       alum & mag hydroxide-simeth (MAALOX/MYLANTA) 200-200-20 MG/5ML suspension 30 mL  30 mL Oral Q4H PRN Weber, Kyra A, NP       Cholecalciferol  TABS 5,000 Units  1 tablet Oral Daily Weber, Kyra A, NP   5,000 Units at 04/05/24 0836   diclofenac  Sodium (VOLTAREN ) 1 % topical gel 2 g  2 g Topical QID Weber, Kyra A, NP       haloperidol  (HALDOL ) tablet 5 mg  5 mg Oral TID PRN Weber, Kyra A, NP       And   diphenhydrAMINE  (BENADRYL ) capsule 50 mg  50 mg Oral TID PRN Weber, Kyra A, NP       haloperidol  lactate (HALDOL ) injection 5 mg  5 mg Intramuscular TID PRN Weber, Kyra A, NP       And   diphenhydrAMINE  (BENADRYL ) injection 50 mg  50 mg Intramuscular TID PRN Weber, Kyra A, NP       And   LORazepam  (ATIVAN ) injection 2 mg  2 mg Intramuscular TID PRN Weber, Kyra A, NP       haloperidol  lactate (HALDOL ) injection 10 mg  10 mg Intramuscular TID PRN Weber, Kyra A, NP       And   diphenhydrAMINE  (BENADRYL ) injection 50 mg  50 mg Intramuscular TID PRN Weber, Kyra A, NP       And   LORazepam  (ATIVAN ) injection 2 mg  2 mg Intramuscular TID PRN Weber, Kyra A, NP       DULoxetine  (CYMBALTA ) DR capsule 60 mg  60 mg Oral Daily Weber, Kyra A, NP   60 mg at 04/05/24 0836   ferrous sulfate  tablet 325 mg  325 mg Oral QHS Weber, Kyra A, NP   325 mg at 04/04/24 2129   gabapentin  (NEURONTIN ) capsule 600 mg  600 mg Oral TID Weber, Kyra A, NP   600 mg at 04/05/24 1725   hydrOXYzine  (ATARAX ) tablet 25 mg  25 mg Oral Q6H PRN Weber, Kyra A, NP        hydrOXYzine  (ATARAX ) tablet 25 mg  25 mg Oral Q6H PRN Izediuno, Iline Mallory, MD       loperamide  (IMODIUM ) capsule 2-4 mg  2-4 mg Oral PRN Izediuno, Iline Mallory, MD       LORazepam  (ATIVAN ) tablet 1 mg  1 mg Oral Q6H PRN Izediuno, Iline Mallory, MD       [START ON 04/06/2024] LORazepam  (ATIVAN ) tablet 1 mg  1 mg Oral BID Izediuno, Iline Mallory, MD       Followed by   Cecily Cohen ON 04/07/2024] LORazepam  (ATIVAN ) tablet 1 mg  1 mg Oral Daily Izediuno, Vincent A, MD       magnesium  hydroxide (MILK OF MAGNESIA) suspension 30 mL  30 mL Oral Daily PRN Weber, Kyra A, NP       multivitamin with minerals tablet 1 tablet  1 tablet Oral Daily Izediuno, Iline Mallory, MD   1 tablet at 04/05/24 0836   naltrexone  (DEPADE) tablet 50 mg  50 mg Oral Daily Weber, Kyra A, NP  50 mg at 04/05/24 1610   ondansetron  (ZOFRAN -ODT) disintegrating tablet 4 mg  4 mg Oral Q6H PRN Amelie Jury, MD       pantoprazole  (PROTONIX ) EC tablet 40 mg  40 mg Oral Daily Weber, Kyra A, NP   40 mg at 04/05/24 9604   propranolol  (INDERAL ) tablet 10 mg  10 mg Oral BID Weber, Kyra A, NP   10 mg at 04/05/24 1725   QUEtiapine  (SEROQUEL ) tablet 100 mg  100 mg Oral QHS Weber, Kyra A, NP   100 mg at 04/04/24 2128   thiamine  (Vitamin B-1) tablet 100 mg  100 mg Oral Daily Zouev, Dmitri, MD       thiamine  (Vitamin B-1) tablet 100 mg  100 mg Oral Daily Izediuno, Vincent A, MD   100 mg at 04/05/24 5409   thiamine  (VITAMIN B1) injection 100 mg  100 mg Intramuscular Once Izediuno, Iline Mallory, MD       traZODone  (DESYREL ) tablet 100 mg  100 mg Oral QHS Weber, Kyra A, NP   100 mg at 04/04/24 2129   PTA Medications: Medications Prior to Admission  Medication Sig Dispense Refill Last Dose/Taking   Cholecalciferol  125 MCG (5000 UT) TABS Take 1 tablet (5,000 Units total) by mouth daily. (Patient not taking: Reported on 04/03/2024) 90 tablet 0    diclofenac  Sodium (VOLTAREN ) 1 % GEL Apply 2 g topically 4 (four) times daily. (Patient not taking: Reported on 04/03/2024) 2 g  0    DULoxetine  (CYMBALTA ) 60 MG capsule Take 1 capsule (60 mg total) by mouth daily. (Patient not taking: Reported on 04/03/2024) 30 capsule 0    ferrous sulfate  325 (65 FE) MG tablet Take 1 tablet (325 mg total) by mouth at bedtime. (Patient not taking: Reported on 04/03/2024) 30 tablet 0    folic acid  (FOLVITE ) 1 MG tablet Take 1 mg by mouth daily. (Patient not taking: Reported on 04/03/2024)      gabapentin  (NEURONTIN ) 300 MG capsule Take 2 capsules (600 mg total) by mouth 3 (three) times daily. 360 capsule 3    hydrOXYzine  (ATARAX ) 25 MG tablet Take 1 tablet (25 mg total) by mouth every 6 (six) hours as needed for anxiety (or CIWA score </= 10). (Patient not taking: Reported on 04/03/2024) 30 tablet 0    Multiple Vitamin (MULTIVITAMIN WITH MINERALS) TABS tablet Take 1 tablet by mouth daily. (Patient not taking: Reported on 04/03/2024) 90 tablet 0    Multiple Vitamins-Minerals (CERTAVITE/ANTIOXIDANTS) TABS Take 1-2 tablets by mouth daily (Patient not taking: Reported on 04/03/2024) 30 tablet 0    naltrexone  (DEPADE) 50 MG tablet Take 1 tablet (50 mg total) by mouth daily. 90 tablet 3    pantoprazole  (PROTONIX ) 40 MG tablet Take 1 tablet (40 mg total) by mouth daily. (Patient not taking: Reported on 04/03/2024) 30 tablet 0    propranolol  (INDERAL ) 10 MG tablet Take 1 tablet (10 mg total) by mouth 2 (two) times daily. (Patient not taking: Reported on 04/03/2024) 60 tablet 0    QUEtiapine  (SEROQUEL ) 100 MG tablet Take 1 tablet (100 mg total) by mouth at bedtime. (Patient not taking: Reported on 04/03/2024) 30 tablet 0    senna-docusate (SENOKOT-S) 8.6-50 MG tablet Take 2 tablets by mouth 2 (two) times daily. (Patient not taking: Reported on 04/03/2024) 120 tablet 0    thiamine  (VITAMIN B1) 100 MG tablet Take 1 tablet (100 mg total) by mouth daily. (Patient not taking: Reported on 04/03/2024) 30 tablet 0    traZODone  (DESYREL ) 100  MG tablet Take 1 tablet (100 mg total) by mouth at bedtime. (Patient not taking:  Reported on 04/03/2024) 30 tablet 0     Patient Stressors: Health problems   Medication change or noncompliance   Substance abuse    Patient Strengths: Capable of independent living  Communication skills  Supportive family/friends   Treatment Modalities: Medication Management, Group therapy, Case management,  1 to 1 session with clinician, Psychoeducation, Recreational therapy.   Physician Treatment Plan for Primary Diagnosis: MDD (major depressive disorder) Long Term Goal(s): Improvement in symptoms so as ready for discharge   Short Term Goals: Ability to identify changes in lifestyle to reduce recurrence of condition will improve Ability to verbalize feelings will improve Ability to disclose and discuss suicidal ideas Ability to demonstrate self-control will improve Ability to identify and develop effective coping behaviors will improve Ability to maintain clinical measurements within normal limits will improve Compliance with prescribed medications will improve Ability to identify triggers associated with substance abuse/mental health issues will improve  Medication Management: Evaluate patient's response, side effects, and tolerance of medication regimen.  Therapeutic Interventions: 1 to 1 sessions, Unit Group sessions and Medication administration.  Evaluation of Outcomes: Not Progressing  Physician Treatment Plan for Secondary Diagnosis: Principal Problem:   MDD (major depressive disorder)  Long Term Goal(s): Improvement in symptoms so as ready for discharge   Short Term Goals: Ability to identify changes in lifestyle to reduce recurrence of condition will improve Ability to verbalize feelings will improve Ability to disclose and discuss suicidal ideas Ability to demonstrate self-control will improve Ability to identify and develop effective coping behaviors will improve Ability to maintain clinical measurements within normal limits will improve Compliance with  prescribed medications will improve Ability to identify triggers associated with substance abuse/mental health issues will improve     Medication Management: Evaluate patient's response, side effects, and tolerance of medication regimen.  Therapeutic Interventions: 1 to 1 sessions, Unit Group sessions and Medication administration.  Evaluation of Outcomes: Not Progressing   RN Treatment Plan for Primary Diagnosis: MDD (major depressive disorder) Long Term Goal(s): Knowledge of disease and therapeutic regimen to maintain health will improve  Short Term Goals: Ability to remain free from injury will improve, Ability to verbalize frustration and anger appropriately will improve, Ability to demonstrate self-control, Ability to participate in decision making will improve, Ability to verbalize feelings will improve, Ability to disclose and discuss suicidal ideas, Ability to identify and develop effective coping behaviors will improve, and Compliance with prescribed medications will improve  Medication Management: RN will administer medications as ordered by provider, will assess and evaluate patient's response and provide education to patient for prescribed medication. RN will report any adverse and/or side effects to prescribing provider.  Therapeutic Interventions: 1 on 1 counseling sessions, Psychoeducation, Medication administration, Evaluate responses to treatment, Monitor vital signs and CBGs as ordered, Perform/monitor CIWA, COWS, AIMS and Fall Risk screenings as ordered, Perform wound care treatments as ordered.  Evaluation of Outcomes: Not Progressing   LCSW Treatment Plan for Primary Diagnosis: MDD (major depressive disorder) Long Term Goal(s): Safe transition to appropriate next level of care at discharge, Engage patient in therapeutic group addressing interpersonal concerns.  Short Term Goals: Engage patient in aftercare planning with referrals and resources, Increase social support,  Increase ability to appropriately verbalize feelings, Increase emotional regulation, Facilitate acceptance of mental health diagnosis and concerns, Facilitate patient progression through stages of change regarding substance use diagnoses and concerns, Identify triggers associated with mental health/substance abuse issues,  and Increase skills for wellness and recovery  Therapeutic Interventions: Assess for all discharge needs, 1 to 1 time with Social worker, Explore available resources and support systems, Assess for adequacy in community support network, Educate family and significant other(s) on suicide prevention, Complete Psychosocial Assessment, Interpersonal group therapy.  Evaluation of Outcomes: Not Progressing   Progress in Treatment: Attending groups: No. Participating in groups: No. Taking medication as prescribed: Yes. Toleration medication: Yes. Family/Significant other contact made: No, will contact:  Flordia Hung (fiancee) [phone] Patient understands diagnosis: Yes. Discussing patient identified problems/goals with staff: Yes. Medical problems stabilized or resolved: Yes. Denies suicidal/homicidal ideation: Yes. Issues/concerns per patient self-inventory: No.  New problem(s) identified:  No  New Short Term/Long Term Goal(s):    medication stabilization, elimination of SI thoughts, development of comprehensive mental wellness plan.    Patient Goals:  "I want to be discharged from the hospital.  I don't want to go to rehab."    Discharge Plan or Barriers:  Patient recently admitted. CSW will continue to follow and assess for appropriate referrals and possible discharge planning.     Reason for Continuation of Hospitalization: Depression Medication stabilization Suicidal ideation  Estimated Length of Stay:  5 - 7 days  Last 3 [place] Suicide Severity Risk Score: Flowsheet Row [facility] (Current[facility]  ED from 04/02/2024 in [facility] ED to Hosp-Admission (Discharged) from 03/16/2024 in [facility]  C-SSRS RISK CATEGORY Low Risk Low Risk No Risk       Last PHQ 2/9 Scores:    02/07/2024   10:22 AM 01/23/2024   10:12 AM 01/18/2024   12:26 AM  Depression screen PHQ 2/9  Decreased Interest 0 0 0  Down, Depressed, Hopeless 0 0 3  PHQ - 2 Score 0 0 3  Altered sleeping  1 3  Tired, decreased energy  1 0  Change in appetite  0 3  Feeling bad or failure about yourself   0 3  Trouble concentrating  1 3  Moving slowly or fidgety/restless  0 0  Suicidal thoughts  0 0  PHQ-9 Score  3 15  Difficult doing work/chores   Extremely dIfficult    Scribe for Treatment Team: Elgie Landino O Letisia Schwalb, LCSWA 04/05/2024 7:03 PM

## 2024-04-05 NOTE — Group Note (Signed)
 Recreation Therapy Group Note   Group Topic:Leisure Education  Group Date: 04/05/2024 Start Time: 0935 End Time: 1005 Facilitators: Arsalan Brisbin-McCall, LRT,CTRS Location: [address] Dayroom   Group Topic: Leisure Education   Goal Area(s) Addresses:  Patient will successfully identify positive leisure and recreation activities.  Patient will acknowledge benefits of participation in healthy leisure activities post discharge.  Patient will actively work with peers toward a shared goal.   Behavioral Response:    Intervention: Competitive Group Game    Activity: Pictionary. In groups of 5-7, patients took turns trying to guess the picture being drawn on the board by their teammate.  If the team guessed the correct answer, they won a point.  If the team guessed wrong, the other team got a chance to steal the point. After several rounds of game play, the team with the most points were declared winners. Post-activity discussion reviewed benefits of positive recreation outlets: reducing stress, improving coping mechanisms, increasing self-esteem, and building larger support systems.   Education:  Teacher, English as a foreign language, Leisure as Merchant navy officer, Programmer, applications, Building control surveyor   Education Outcome: Acknowledges education/In group clarification offered/Needs additional education   Affect/Mood: N/A   Participation Level: Did not attend    Clinical Observations/Individualized Feedback:     Plan: Continue to engage patient in RT group sessions 2-3x/week.   Riddick Nuon-McCall, LRT,CTRS 04/05/2024 12:17 PM

## 2024-04-05 NOTE — Plan of Care (Signed)
   Problem: Coping: Goal: Ability to verbalize frustrations and anger appropriately will improve Outcome: Progressing Goal: Ability to demonstrate self-control will improve Outcome: Progressing

## 2024-04-05 NOTE — [facility] Group Notes (Signed)
 Adult Psychoeducational Group Note  Date:  04/05/2024 Time:  11:22 PM  Group Topic/Focus:  Wrap-Up Group:   The focus of this group is to help patients review their daily goal of treatment and discuss progress on daily workbooks.   Xavier Owens [name] 04/05/2024, 11:22 PM

## 2024-04-05 NOTE — Plan of Care (Signed)
  Problem: Education: Goal: Emotional status will improve Outcome: Progressing Goal: Mental status will improve Outcome: Progressing   Problem: Activity: Goal: Interest or engagement in activities will improve Outcome: Progressing   Problem: Coping: Goal: Ability to demonstrate self-control will improve Outcome: Progressing   Problem: Health Behavior/Discharge Planning: Goal: Compliance with treatment plan for underlying cause of condition will improve Outcome: Progressing   Problem: Education: Goal: Emotional status will improve Outcome: Progressing Goal: Mental status will improve Outcome: Progressing   Problem: Activity: Goal: Interest or engagement in activities will improve Outcome: Progressing   Problem: Coping: Goal: Ability to demonstrate self-control will improve Outcome: Progressing   Problem: Health Behavior/Discharge Planning: Goal: Compliance with treatment plan for underlying cause of condition will improve Outcome: Progressing

## 2024-04-05 NOTE — Progress Notes (Signed)
   04/05/24 0800  Psychosocial Assessment  Patient Complaints Anxiety;Depression  Eye Contact Brief  Facial Expression Flat;Pensive  Affect Depressed  Speech Logical/coherent  Interaction Cautious  Motor Activity Slow  Appearance/Hygiene In scrubs  Behavior Characteristics Cooperative;Anxious  Mood Depressed;Anxious  Thought Process  Coherency WDL  Content WDL  Delusions None reported or observed  Perception WDL  Hallucination None reported or observed  Judgment Impaired  Confusion None  Danger to Self  Current suicidal ideation? Denies  Danger to Others  Danger to Others None reported or observed

## 2024-04-05 NOTE — Group Note (Signed)
 Date:  04/05/2024 Time:  9:05 AM  Group Topic/Focus:  Goals Group:   The focus of this group is to help patients establish daily goals to achieve during treatment and discuss how the patient can incorporate goal setting into their daily lives to aide in recovery.    Participation Level:  Did Not Attend   Xavier Owens 04/05/2024, 9:05 AM
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
## 2024-04-06 DIAGNOSIS — F332 Major depressive disorder, recurrent severe without psychotic features: Secondary | ICD-10-CM | POA: Diagnosis not present

## 2024-04-06 MED ORDER — DICLOFENAC SODIUM 1 % EX GEL: 2.0000 g | Freq: Every day | CUTANEOUS | Status: DC | PRN

## 2024-04-06 NOTE — Plan of Care (Signed)
  Problem: Education: Goal: Emotional status will improve Outcome: Progressing Goal: Mental status will improve Outcome: Progressing   Problem: Activity: Goal: Interest or engagement in activities will improve Outcome: Progressing   Problem: Coping: Goal: Ability to verbalize frustrations and anger appropriately will improve Outcome: Progressing   Problem: Health Behavior/Discharge Planning: Goal: Identification of resources available to assist in meeting health care needs will improve Outcome: Progressing   Problem: Physical Regulation: Goal: Ability to maintain clinical measurements within normal limits will improve Outcome: Progressing

## 2024-04-06 NOTE — Progress Notes (Incomplete)
 Patient is detoxing well from alcohol.  No seizures so far.  No other complications.  We will maintain his current regimen.

## 2024-04-06 NOTE — Progress Notes (Signed)
   04/06/24 2200  Psych Admission Type (Psych Patients Only)  Admission Status Voluntary  Psychosocial Assessment  Patient Complaints Anxiety;Worrying  Eye Contact Brief  Facial Expression Flat;Anxious  Affect Depressed;Appropriate to circumstance;Anxious  Speech Logical/coherent  Interaction Guarded  Motor Activity Slow  Appearance/Hygiene In scrubs  Behavior Characteristics Cooperative;Appropriate to situation  Mood Depressed;Anxious;Pleasant  Thought Process  Coherency WDL  Content WDL  Delusions None reported or observed  Perception WDL  Hallucination None reported or observed  Judgment WDL  Confusion None  Danger to Self  Current suicidal ideation? Denies  Danger to Others  Danger to Others None reported or observed
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
## 2024-04-06 NOTE — [facility] Group Notes (Signed)
 [facility] Group Notes:  (Nursing/MHT/Case Management/Adjunct)  Date:  04/06/2024  Time:  2000  Type of Therapy:  Wrap up group  Participation Level:  Minimal  Participation Quality:  Sharing  Affect:  Appropriate  Cognitive:  Alert  Insight:  Improving  Engagement in Group:  Limited  Modes of Intervention:  Clarification, Education, and Support  Summary of Progress/Problems: Patient joined group as it was ending. Pt did answer questions about positive thinking and positive change.   Xavier Owens 04/06/2024, 9:54 PM

## 2024-04-06 NOTE — Progress Notes (Signed)
 Pt spending most of his time in his room.  Interacting appropriately with Clinical research associate.  Pt rating depression and anxiety 10/10 today.  Pt reported he has a long going on right now.  Pt did not open up at this time regarding what specifically influenced these ratings.  Needs assessed.  Pt denied.  Pt denied SI, HI and aVH.  Safety check continue for patient safety.  Pt safe on unit.

## 2024-04-06 NOTE — Progress Notes (Signed)
   04/06/24 1000  Psych Admission Type (Psych Patients Only)  Admission Status Voluntary  Psychosocial Assessment  Patient Complaints Anxiety;Depression  Eye Contact Brief  Facial Expression Flat  Affect Depressed  Speech Logical/coherent  Interaction Cautious  Motor Activity Slow  Appearance/Hygiene In scrubs  Behavior Characteristics Cooperative;Anxious  Mood Depressed;Anxious  Thought Process  Coherency WDL  Content WDL  Delusions None reported or observed  Perception WDL  Hallucination None reported or observed  Judgment WDL  Confusion WDL  Danger to Self  Current suicidal ideation? Denies  Danger to Others  Danger to Others None reported or observed

## 2024-04-06 NOTE — [facility] Group Notes (Signed)
 LCSW Wellness Group Note   04/06/2024 1:00pm  Type of Group and Topic: Psychoeducational Group:  Wellness  Participation Level:  did not attend  Description of Group  Wellness group introduces the topic and its focus on developing healthy habits across the spectrum and its relationship to a decrease in hospital admissions.  Six areas of wellness are discussed: physical, social spiritual, intellectual, occupational, and emotional.  Patients are asked to consider their current wellness habits and to identify areas of wellness where they are interested and able to focus on improvements.    Therapeutic Goals Patients will understand components of wellness and how they can positively impact overall health.  Patients will identify areas of wellness where they have developed good habits. Patients will identify areas of wellness where they would like to make improvements.    Summary of Patient Progress     Therapeutic Modalities: Cognitive Behavioral Therapy Psychoeducation    Elspeth Hals, LCSW

## 2024-04-06 NOTE — [facility] Suicide Risk Assessment (Signed)
 [facility] INPATIENT:  Family/Significant Other Suicide Prevention Education  Suicide Prevention Education:  Family/Significant Other Refusal to Support Patient after Discharge:  Suicide Prevention Education Not Provided:  Patient has identified home of family/significant other as the place the patient will be residing after discharge.  With written consent of the patient, two attempts were made to provide Suicide Prevention Education to [facility] [phone] , (name of family member/significant other).  This person indicates he/she will not be responsible for the patient after discharge.  CSW contacted pt's fiance who reports that pt does have access to guns, but reports no guns or weapons are allowed in her home. Pt's fiance reports that pt always has knives with him due to his cultural background.   CSW inquired how pt's fiance felt pt was doing given their phone calls, she states, "I'm not sure, I've asked him how things were going. He seems to be fixated on that he has gotten his medicine back but he doesn't feel quite right, but he wants to come home already."  Pt's fiance states that before pt was admitted to the hospital, "He said he hasn't been sleeping, he said he wanted to take a .45 to his head and end it all. The guy that he was with made a promise to me that he would take him to the hospital. So they went to [facility]"  Pt's fiance reports that alcohol is a concern for her, stating "The alcohol is just a real big issue. I'm concerned about him, they have referred him to a neurologist and he didn't go. He thinks that he's fooling somebody but he's not. I'm concerned about the drinking. I'm just concerned about him. 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 He's very charming, he's very manipulative. I loved him, he's loved me since he was 53 years old. I just want him to be healthy and be well. I want his mind to be well as much as possible, his anxiety is through the roof, you talking about shaking and seizures. I would love for him to get sober and stay sober, so he can come home."  CSW provided support and reassurance.    Xavier Owens 04/06/2024,2:47 PM

## 2024-04-06 NOTE — Group Note (Signed)
 Date:  04/06/2024 Time:  9:50 AM  Group Topic/Focus:  Goals Group:   The focus of this group is to help patients establish daily goals to achieve during treatment and discuss how the patient can incorporate goal setting into their daily lives to aide in recovery. Orientation:   The focus of this group is to educate the patient on the purpose and policies of crisis stabilization and provide a format to answer questions about their admission.  The group details unit policies and expectations of patients while admitted.    Participation Level:  Did Not Attend

## 2024-04-07 DIAGNOSIS — F332 Major depressive disorder, recurrent severe without psychotic features: Secondary | ICD-10-CM

## 2024-04-07 NOTE — Progress Notes (Signed)
 Pt was having a hard time today, not feeling well physically and mentally. Ruminating. Pt reported feeling very anxious and agitated and asked for something to help. CIWA was scored at an 11. Patient was given PRN Ativan  1 mg which was effective. Pt denies SI/HI/AVH. Pt encouraged to reach out to staff with any needs or concerns. Med compliant. Will continue to monitor.

## 2024-04-07 NOTE — Group Note (Signed)
 Date:  04/07/2024 Time:  5:12 PM  Group Topic/Focus:   Healthy Communication:   The focus of this group is to discuss boundaries, barriers to setting effective boundaries, as well as healthy ways to communicate them with others.     Participation Level:  Did Not Attend   Sheryl Donna 04/07/2024, 5:12 PM

## 2024-04-07 NOTE — Progress Notes (Signed)
 [facility] MD Progress Note  04/07/2024 12:26 PM Xavier Owens  MRN:  [id number] Subjective:   53 year old African-American male, divorced, self-employed, lives alone. Extensive history of alcohol use disorder and multiple medical comorbidities related to alcohol use. Average BAL at previous presentations in the high 400s. History of withdrawal seizures in the past. Presented this time with BAL 257. Current presentation is on account of worsening depression associated with suicidal thoughts. He had not been fully adherent on his medications.  Chart reviewed today.  Patient discussed at multidisciplinary team meeting.  Nursing staff reports that he slept for 9 hours.  No challenging behavior.  He is has been detoxing appropriately from alcohol.   He has been adherent with his prescribed medications.  Seen today.  Patient tells me that he has been feeling "sideways".  When asked to explain what this means, he states that he has been ruminating on the negative things that have gone on in his life.  He feels like he let his family down.  States that his daughter is graduating in [place] on 6 June.  Patient states that he does not have the financial resources to be there.  He hopes that his daughter will understand.  He plans to contact her via face time.  Patient tells me that he has been feeling better.  He had Ativan  this morning.  He is not experiencing any form of hallucination.  No delusional preoccupation.  No suicidal thoughts.  No rageful thoughts towards others or to property.  He denies any current cravings for alcohol.  States that he made a promise to his grandmother to stay sober.  No interest in groups.  No interest in rehab. Encouraged to keep ventilating his feelings to staff.  Principal Problem: MDD (major depressive disorder) Diagnosis: Principal Problem:   MDD (major depressive disorder)  Total Time spent with patient: 20 minutes  Past Psychiatric History:  See H&P  Past Medical  History:  Past Medical History:  Diagnosis Date   Alcoholic (HCC)    Hypertension    Seizures (HCC)    Tremor     Past Surgical History:  Procedure Laterality Date   dislocated hip     HAND SURGERY     KNEE SURGERY     Family History:  Family History  Problem Relation Age of Onset   Cancer Mother    Cancer Father    Family Psychiatric  History:  See H&P  Social History:  Social History   Substance and Sexual Activity  Alcohol Use Yes   Comment: "a fifth of vodka every day for 30 years"     Social History   Substance and Sexual Activity  Drug Use Yes   Types: Marijuana    Social History   Socioeconomic History   Marital status: Married    Spouse name: Not on file   Number of children: Not on file   Years of education: Not on file   Highest education level: Not on file  Occupational History   Not on file  Tobacco Use   Smoking status: Every Day    Current packs/day: 0.25    Types: Cigarettes   Smokeless tobacco: Never  Vaping Use   Vaping status: Never Used  Substance and Sexual Activity   Alcohol use: Yes    Comment: "a fifth of vodka every day for 30 years"   Drug use: Yes    Types: Marijuana   Sexual activity: Yes  Other Topics Concern   Not on  file  Social History Narrative   ** Merged History Encounter **       Social Drivers of Health   Financial Resource Strain: Not on file  Food Insecurity: No Food Insecurity (04/03/2024)   Hunger Vital Sign    Worried About Running Out of Food in the Last Year: Never true    Ran Out of Food in the Last Year: Never true  Transportation Needs: No Transportation Needs (04/03/2024)   PRAPARE - Administrator, Civil Service (Medical): No    Lack of Transportation (Non-Medical): No  Physical Activity: Not on file  Stress: Not on file  Social Connections: Socially Integrated (01/24/2024)   Social Connection and Isolation Panel [NHANES]    Frequency of Communication with Friends and Family: More  than three times a week    Frequency of Social Gatherings with Friends and Family: More than three times a week    Attends Religious Services: More than 4 times per year    Active Member of [employer] or Organizations: Yes    Attends Engineer, structural: More than 4 times per year    Marital Status: Living with partner   Additional Social History:   Current Medications: Current Facility-Administered Medications  Medication Dose Route Frequency Provider Last Rate Last Admin   acetaminophen  (TYLENOL ) tablet 650 mg  650 mg Oral Q6H PRN Weber, Kyra A, NP   650 mg at 04/06/24 2025   alum & mag hydroxide-simeth (MAALOX/MYLANTA) 200-200-20 MG/5ML suspension 30 mL  30 mL Oral Q4H PRN Weber, Kyra A, NP       Cholecalciferol  TABS 5,000 Units  1 tablet Oral Daily Weber, Kyra A, NP   5,000 Units at 04/07/24 8657   diclofenac  Sodium (VOLTAREN ) 1 % topical gel 2 g  2 g Topical Daily PRN Ntuen, Tina C, FNP       haloperidol  (HALDOL ) tablet 5 mg  5 mg Oral TID PRN Weber, Kyra A, NP       And   diphenhydrAMINE  (BENADRYL ) capsule 50 mg  50 mg Oral TID PRN Weber, Kyra A, NP       haloperidol  lactate (HALDOL ) injection 5 mg  5 mg Intramuscular TID PRN Weber, Kyra A, NP       And   diphenhydrAMINE  (BENADRYL ) injection 50 mg  50 mg Intramuscular TID PRN Weber, Kyra A, NP       And   LORazepam  (ATIVAN ) injection 2 mg  2 mg Intramuscular TID PRN Weber, Kyra A, NP       haloperidol  lactate (HALDOL ) injection 10 mg  10 mg Intramuscular TID PRN Weber, Kyra A, NP       And   diphenhydrAMINE  (BENADRYL ) injection 50 mg  50 mg Intramuscular TID PRN Weber, Kyra A, NP       And   LORazepam  (ATIVAN ) injection 2 mg  2 mg Intramuscular TID PRN Weber, Kyra A, NP       DULoxetine  (CYMBALTA ) DR capsule 60 mg  60 mg Oral Daily Weber, Kyra A, NP   60 mg at 04/07/24 8469   ferrous sulfate  tablet 325 mg  325 mg Oral QHS Weber, Kyra A, NP   325 mg at 04/06/24 2046   gabapentin  (NEURONTIN ) capsule 600 mg  600 mg Oral TID  Weber, Kyra A, NP   600 mg at 04/07/24 0819   hydrOXYzine  (ATARAX ) tablet 25 mg  25 mg Oral Q6H PRN Weber, Kyra A, NP       hydrOXYzine  (  ATARAX ) tablet 25 mg  25 mg Oral Q6H PRN Aseem Sessums, Iline Mallory, MD       loperamide  (IMODIUM ) capsule 2-4 mg  2-4 mg Oral PRN Shah Insley, Iline Mallory, MD       LORazepam  (ATIVAN ) tablet 1 mg  1 mg Oral Q6H PRN Mckinsley Koelzer, Iline Mallory, MD       magnesium  hydroxide (MILK OF MAGNESIA) suspension 30 mL  30 mL Oral Daily PRN Weber, Kyra A, NP       multivitamin with minerals tablet 1 tablet  1 tablet Oral Daily Zoua Caporaso, Iline Mallory, MD   1 tablet at 04/07/24 2130   naltrexone  (DEPADE) tablet 50 mg  50 mg Oral Daily Weber, Kyra A, NP   50 mg at 04/07/24 8657   ondansetron  (ZOFRAN -ODT) disintegrating tablet 4 mg  4 mg Oral Q6H PRN Christen Wardrop, Iline Mallory, MD       pantoprazole  (PROTONIX ) EC tablet 40 mg  40 mg Oral Daily Weber, Kyra A, NP   40 mg at 04/07/24 8469   propranolol  (INDERAL ) tablet 10 mg  10 mg Oral BID Weber, Kyra A, NP   10 mg at 04/07/24 0818   QUEtiapine  (SEROQUEL ) tablet 100 mg  100 mg Oral QHS Weber, Kyra A, NP   100 mg at 04/06/24 2046   thiamine  (Vitamin B-1) tablet 100 mg  100 mg Oral Daily Zouev, Dmitri, MD   100 mg at 04/07/24 6295   thiamine  (Vitamin B-1) tablet 100 mg  100 mg Oral Daily Alexzander Dolinger, Iline Mallory, MD   100 mg at 04/07/24 2841   thiamine  (VITAMIN B1) injection 100 mg  100 mg Intramuscular Once Zarielle Cea, Iline Mallory, MD       traZODone  (DESYREL ) tablet 100 mg  100 mg Oral QHS Weber, Kyra A, NP   100 mg at 04/06/24 2046    Lab Results: No results found for this or any previous visit (from the past 48 hours).  Blood Alcohol level:  Lab Results  Component Value Date   ETH 257 (H) 04/02/2024   ETH <15 03/16/2024    Metabolic Disorder Labs: Lab Results  Component Value Date   HGBA1C 4.5 (L) 01/29/2024   MPG 82.45 01/29/2024   MPG 97 12/09/2021   No results found for: "PROLACTIN" Lab Results  Component Value Date   CHOL 169 04/04/2024   TRIG  195 (H) 04/04/2024   HDL 56 04/04/2024   CHOLHDL 3.0 04/04/2024   VLDL 39 04/04/2024   LDLCALC 74 04/04/2024   LDLCALC 91 01/29/2024    Physical Findings: AIMS:  , ,  ,  ,    CIWA:  CIWA-Ar Total: 1 COWS:     Musculoskeletal: Strength & Muscle Tone: within normal limits Gait & Station: Broad-based Patient leans: N/A  Psychiatric Specialty Exam:  Presentation  General Appearance:  Casually dressed, in bed prior to interview, not in any distress, appropriate behavior, engaged politely.  No tremors.  No confusion.  Eye Contact: Good.  Normal conjugate eye movement.  Speech: Spontaneous.  Soft spoken.  Mood and Affect  Mood: Worried but not pervasively down.  Affect: Restricted and appropriate.  Thought Process  Thought Processes: Linear and goal directed.  Descriptions of Associations:Intact  Orientation:Full (Time, Place and Person)  Thought Content: Negative ruminations.  Guilty ruminations.  No current suicidal thoughts.  Future-oriented.  No homicidal thoughts.  No thoughts of violence. No delusional theme.  No obsessions.  Hallucinations: No hallucination in any modality.  Sensorium  Memory: Good.  Judgment: Good.  Insight: Good  Executive Functions  Concentration: Good.  Attention Span: Good.  Recall: Good.  Fund of Knowledge: Good.  Language: Good   Psychomotor Activity  Normal psychomotor activity    Physical Exam: Physical Exam ROS Blood pressure 100/77, pulse 88, temperature 97.6 F (36.4 C), temperature source Oral, resp. rate 17, height 5\' 8"  (1.727 m), weight 73.5 kg, SpO2 99%. Body mass index is 24.63 kg/m.   Treatment Plan Summary: Patient is gradually coming off alcohol.  No complications so far.  He is tolerating his medications well.  No current dangerousness.  We will keep his medications the same.  Hopeful discharge by mid week if he maintains stability.  1.  Continue duloxetine  60 mg daily. 2.  Continue  quetiapine  100 mg at bedtime. 3.  Continue gabapentin  600 mg 3 times daily. 4.  Continue naltrexone  50 mg daily. 5.  Continue to monitor mood behavior and interaction with others. 6.  Continue to encourage unit groups and therapeutic activities. 7.  Social worker will coordinate discharge and aftercare planning.   Amelie Jury, MD 04/07/2024, 12:26 PM

## 2024-04-07 NOTE — Group Note (Signed)
 Date:  04/07/2024 Time:  5:16 PM  Group Topic/Focus:  Goals Group:   The focus of this group is to help patients establish daily goals to achieve during treatment and discuss how the patient can incorporate goal setting into their daily lives to aide in recovery. Orientation:   The focus of this group is to educate the patient on the purpose and policies of crisis stabilization and provide a format to answer questions about their admission.  The group details unit policies and expectations of patients while admitted.    Participation Level:  Did Not Attend   Sheryl Donna 04/07/2024, 5:16 PM

## 2024-04-07 NOTE — Progress Notes (Signed)
   04/07/24 1400  Psych Admission Type (Psych Patients Only)  Admission Status Voluntary  Psychosocial Assessment  Patient Complaints Anxiety;Depression  Eye Contact Brief  Facial Expression Flat;Anxious  Affect Appropriate to circumstance  Speech Logical/coherent  Interaction Cautious  Motor Activity Slow  Appearance/Hygiene Unremarkable  Behavior Characteristics Cooperative  Mood Depressed;Anxious  Thought Process  Coherency WDL  Content WDL  Delusions None reported or observed  Perception WDL  Hallucination None reported or observed  Judgment WDL  Confusion None  Danger to Self  Current suicidal ideation? Denies  Danger to Others  Danger to Others None reported or observed

## 2024-04-07 NOTE — Plan of Care (Signed)
  Problem: Education: Goal: Emotional status will improve Outcome: Progressing   Problem: Activity: Goal: Interest or engagement in activities will improve Outcome: Not Progressing   

## 2024-04-07 NOTE — [facility] Group Notes (Signed)
 [facility] Group Notes:  (Nursing/MHT/Case Management/Adjunct)  Date:  04/07/2024  Time:  9:45 PM  Type of Therapy:  Wrap-up group  Participation Level:  Active  Participation Quality:  Appropriate  Affect:  Appropriate  Cognitive:  Appropriate  Insight:  Appropriate  Engagement in Group:  Engaged  Modes of Intervention:  Education  Summary of Progress/Problems: Goal get better and go home. Rated day 5/10.   Xavier Owens 04/07/2024, 9:45 PM

## 2024-04-07 NOTE — Plan of Care (Signed)
   Problem: Safety: Goal: Periods of time without injury will increase Outcome: Progressing

## 2024-04-08 DIAGNOSIS — F332 Major depressive disorder, recurrent severe without psychotic features: Secondary | ICD-10-CM | POA: Diagnosis not present

## 2024-04-08 NOTE — Progress Notes (Signed)
   04/07/24 2035  Psych Admission Type (Psych Patients Only)  Admission Status Voluntary  Psychosocial Assessment  Patient Complaints Depression  Eye Contact Fair  Facial Expression Flat  Affect Appropriate to circumstance  Speech Logical/coherent  Motor Activity Other (Comment) (WNL)  Appearance/Hygiene Other (Comment) (WNL)  Behavior Characteristics Cooperative;Calm  Mood Depressed  Thought Process  Coherency WDL  Content WDL  Delusions None reported or observed  Perception WDL  Hallucination None reported or observed  Judgment WDL  Confusion None  Danger to Self  Current suicidal ideation?  (Denies)  Danger to Others  Danger to Others None reported or observed

## 2024-04-08 NOTE — Plan of Care (Signed)
 Pt A/O x4 with noted MDD. Pt able to verbalize needs. Meds whole. Snacks/fluids offered. ADL's self. Ambulates with a steady gait. Pt's mood is pleasant. Pt denies SI/HI; A/V/H.Pt in bed eyes closed with equal rise and fall of chest. No noted distress. Continuing of care during 7p-7a shift.

## 2024-04-08 NOTE — Plan of Care (Signed)
   Problem: Education: Goal: Emotional status will improve Outcome: Progressing Goal: Verbalization of understanding the information provided will improve Outcome: Progressing

## 2024-04-08 NOTE — Group Note (Signed)
 Recreation Therapy Group Note   Group Topic:Leisure Education  Group Date: 04/08/2024 Start Time: 0932 End Time: 1018 Facilitators: Winnifred Dufford-McCall, LRT,CTRS Location: [address] Dayroom   Group Topic: Leisure Education   Goal Area(s) Addresses:  Patient will successfully identify positive leisure and recreation activities.  Patient will acknowledge benefits of participation in healthy leisure activities post discharge.  Patient will actively work with peers toward a shared goal.   Behavioral Response: Observant   Intervention: Competitive Group Game   Activity: Guess the [employer]. The game was broken up into 6 categories (Pop, [place], [place], Hip-Hop, R&B and Dance). The first team would use the spinner to land one of the music categories. Which ever category the spinner landed on, that group would read the lyrics and opposing team had to fill in the blank. If they answered correctly, they kept the card. The team with the most cards at the end, wins the game. Post-activity discussion reviewed benefits of positive recreation outlets: reducing stress, improving coping mechanisms, increasing self-esteem, and building larger support systems.   Education:  Teacher, English as a foreign language, Stress Management, Discharge Planning  Education Outcome: Acknowledges education/In group clarification offered/Needs additional education   Affect/Mood: Appropriate   Participation Level: None   Participation Quality: None   Behavior: On-looking   Speech/Thought Process: None   Insight: None   Judgement: None   Modes of Intervention: Competitive Play   Patient Response to Interventions:  Disengaged   Education Outcome:  In group clarification offered    Clinical Observations/Individualized Feedback: Pt didn't participate in activity. Pt sat and observed.     Plan: Continue to engage patient in RT group sessions 2-3x/week.   Correy Weidner-McCall, LRT,CTRS 04/08/2024 1:58 PM

## 2024-04-08 NOTE — Progress Notes (Signed)
 [facility] MD Progress Note  04/08/2024 4:18 PM Xavier Owens  MRN:  [id number] Subjective:   53 year old African-American male, divorced, self-employed, lives alone. Extensive history of alcohol use disorder and multiple medical comorbidities related to alcohol use. Average BAL at previous presentations in the high 400s. History of withdrawal seizures in the past. Presented this time with BAL 257. Current presentation is on account of worsening depression associated with suicidal thoughts. He had not been fully adherent on his medications.  Chart reviewed today.  Patient discussed at multidisciplinary team meeting.  Nursing staff reports that he slept for 9 hours.  No challenging behavior.  He is has been detoxing appropriately from alcohol.   He has been adherent with his prescribed medications.  Today's assessment notes: Xavier Owens is seen seen and examined today sitting up in a chair on the unit.  He presents alert, calm, cooperative and oriented to person, place, time, and situation.  Mood appears sad and down, however, no withdrawal symptoms from alcohol observed during this assessment.  CIWA score of 2 due to anxiety. He denies any current cravings for alcohol.  States that he made a promise to his grandmother to stay sober. States that his anxiety is increasing and rates as #10/10, with 10 being high severity. When asked about his stressors, reports due to family matters.  Added that his daughter is graduating in [place], [place] , on 19 April 2024 and he does not have the financial resources to attend his daughter's graduation. He hopes that his daughter will understand.  He plans to contact her via face time.  Active listening and emotional support provided for ongoing stressors.  Patient denies delusional preoccupation, or paranoia.  He further denies SI, HI, or AVH. No rageful thoughts towards others or to property. Observed patient attending and participating passively in groups today.  No interest in  rehab. Encouraged to keep ventilating his feelings to staff.  Principal Problem: MDD (major depressive disorder) Diagnosis: Principal Problem:   MDD (major depressive disorder)  Total Time spent with patient: 20 minutes  Past Psychiatric History:  See H&P  Past Medical History:  Past Medical History:  Diagnosis Date   Alcoholic (HCC)    Hypertension    Seizures (HCC)    Tremor     Past Surgical History:  Procedure Laterality Date   dislocated hip     HAND SURGERY     KNEE SURGERY     Family History:  Family History  Problem Relation Age of Onset   Cancer Mother    Cancer Father    Family Psychiatric  History:  See H&P  Social History:  Social History   Substance and Sexual Activity  Alcohol Use Yes   Comment: "a fifth of vodka every day for 30 years"     Social History   Substance and Sexual Activity  Drug Use Yes   Types: Marijuana    Social History   Socioeconomic History   Marital status: Married    Spouse name: Not on file   Number of children: Not on file   Years of education: Not on file   Highest education level: Not on file  Occupational History   Not on file  Tobacco Use   Smoking status: Every Day    Current packs/day: 0.25    Types: Cigarettes   Smokeless tobacco: Never  Vaping Use   Vaping status: Never Used  Substance and Sexual Activity   Alcohol use: Yes    Comment: "a fifth of  vodka every day for 30 years"   Drug use: Yes    Types: Marijuana   Sexual activity: Yes  Other Topics Concern   Not on file  Social History Narrative   ** Merged History Encounter **       Social Drivers of Health   Financial Resource Strain: Not on file  Food Insecurity: No Food Insecurity (04/03/2024)   Hunger Vital Sign    Worried About Running Out of Food in the Last Year: Never true    Ran Out of Food in the Last Year: Never true  Transportation Needs: No Transportation Needs (04/03/2024)   PRAPARE - Scientist, research (physical sciences) (Medical): No    Lack of Transportation (Non-Medical): No  Physical Activity: Not on file  Stress: Not on file  Social Connections: Socially Integrated (01/24/2024)   Social Connection and Isolation Panel [NHANES]    Frequency of Communication with Friends and Family: More than three times a week    Frequency of Social Gatherings with Friends and Family: More than three times a week    Attends Religious Services: More than 4 times per year    Active Member of [employer] or Organizations: Yes    Attends Engineer, structural: More than 4 times per year    Marital Status: Living with partner   Additional Social History:   Current Medications: Current Facility-Administered Medications  Medication Dose Route Frequency Provider Last Rate Last Admin   acetaminophen  (TYLENOL ) tablet 650 mg  650 mg Oral Q6H PRN Weber, Kyra A, NP   650 mg at 04/08/24 0747   alum & mag hydroxide-simeth (MAALOX/MYLANTA) 200-200-20 MG/5ML suspension 30 mL  30 mL Oral Q4H PRN Weber, Kyra A, NP       Cholecalciferol  TABS 5,000 Units  1 tablet Oral Daily Weber, Kyra A, NP   5,000 Units at 04/08/24 1610   diclofenac  Sodium (VOLTAREN ) 1 % topical gel 2 g  2 g Topical Daily PRN Dilia Alemany C, FNP       haloperidol  (HALDOL ) tablet 5 mg  5 mg Oral TID PRN Weber, Kyra A, NP       And   diphenhydrAMINE  (BENADRYL ) capsule 50 mg  50 mg Oral TID PRN Weber, Kyra A, NP       haloperidol  lactate (HALDOL ) injection 5 mg  5 mg Intramuscular TID PRN Weber, Kyra A, NP       And   diphenhydrAMINE  (BENADRYL ) injection 50 mg  50 mg Intramuscular TID PRN Weber, Kyra A, NP       And   LORazepam  (ATIVAN ) injection 2 mg  2 mg Intramuscular TID PRN Weber, Kyra A, NP       haloperidol  lactate (HALDOL ) injection 10 mg  10 mg Intramuscular TID PRN Weber, Kyra A, NP       And   diphenhydrAMINE  (BENADRYL ) injection 50 mg  50 mg Intramuscular TID PRN Weber, Kyra A, NP       And   LORazepam  (ATIVAN ) injection 2 mg  2 mg  Intramuscular TID PRN Weber, Kyra A, NP       DULoxetine  (CYMBALTA ) DR capsule 60 mg  60 mg Oral Daily Weber, Kyra A, NP   60 mg at 04/08/24 0748   ferrous sulfate  tablet 325 mg  325 mg Oral QHS Weber, Kyra A, NP   325 mg at 04/07/24 2049   gabapentin  (NEURONTIN ) capsule 600 mg  600 mg Oral TID Weber, Kyra A, NP  600 mg at 04/08/24 1610   hydrOXYzine  (ATARAX ) tablet 25 mg  25 mg Oral Q6H PRN Weber, Kyra A, NP   25 mg at 04/08/24 0747   magnesium  hydroxide (MILK OF MAGNESIA) suspension 30 mL  30 mL Oral Daily PRN Weber, Kyra A, NP       multivitamin with minerals tablet 1 tablet  1 tablet Oral Daily Izediuno, Iline Mallory, MD   1 tablet at 04/08/24 0748   naltrexone  (DEPADE) tablet 50 mg  50 mg Oral Daily Weber, Kyra A, NP   50 mg at 04/08/24 0748   pantoprazole  (PROTONIX ) EC tablet 40 mg  40 mg Oral Daily Weber, Kyra A, NP   40 mg at 04/08/24 0748   propranolol  (INDERAL ) tablet 10 mg  10 mg Oral BID Weber, Kyra A, NP   10 mg at 04/08/24 1611   QUEtiapine  (SEROQUEL ) tablet 100 mg  100 mg Oral QHS Weber, Kyra A, NP   100 mg at 04/07/24 2050   thiamine  (Vitamin B-1) tablet 100 mg  100 mg Oral Daily Zouev, Dmitri, MD   100 mg at 04/07/24 1610   thiamine  (Vitamin B-1) tablet 100 mg  100 mg Oral Daily Izediuno, Iline Mallory, MD   100 mg at 04/08/24 9604   thiamine  (VITAMIN B1) injection 100 mg  100 mg Intramuscular Once Izediuno, Iline Mallory, MD       traZODone  (DESYREL ) tablet 100 mg  100 mg Oral QHS Weber, Kyra A, NP   100 mg at 04/07/24 2049    Lab Results: No results found for this or any previous visit (from the past 48 hours).  Blood Alcohol level:  Lab Results  Component Value Date   ETH 257 (H) 04/02/2024   ETH <15 03/16/2024    Metabolic Disorder Labs: Lab Results  Component Value Date   HGBA1C 4.5 (L) 01/29/2024   MPG 82.45 01/29/2024   MPG 97 12/09/2021   No results found for: "PROLACTIN" Lab Results  Component Value Date   CHOL 169 04/04/2024   TRIG 195 (H) 04/04/2024   HDL 56  04/04/2024   CHOLHDL 3.0 04/04/2024   VLDL 39 04/04/2024   LDLCALC 74 04/04/2024   LDLCALC 91 01/29/2024    Physical Findings: AIMS:  , ,  ,  ,    CIWA:  CIWA-Ar Total: 2 COWS:     Musculoskeletal: Strength & Muscle Tone: within normal limits Gait & Station: Broad-based Patient leans: N/A  Psychiatric Specialty Exam:  Presentation  General Appearance:  Casually dressed, in bed prior to interview, not in any distress, appropriate behavior, engaged politely.  No tremors.  No confusion.  Eye Contact: Good.  Normal conjugate eye movement.  Speech: Spontaneous.  Soft spoken.  Mood and Affect  Mood: Worried but not pervasively down.  Affect: Restricted and appropriate.  Thought Process  Thought Processes: Linear and goal directed.  Descriptions of Associations:Intact  Orientation:Full (Time, Place and Person)  Thought Content: Negative ruminations.  Guilty ruminations.  No current suicidal thoughts.  Future-oriented.  No homicidal thoughts.  No thoughts of violence. No delusional theme.  No obsessions.  Hallucinations: No hallucination in any modality.  Sensorium  Memory: Good.  Judgment: Good.  Insight: Good  Executive Functions  Concentration: Good.  Attention Span: Good.  Recall: Good.  Fund of Knowledge: Good.  Language: Good  Psychomotor Activity  Normal psychomotor activity  Physical Exam: Physical Exam Vitals and nursing note reviewed.  HENT:     Head: Normocephalic.  Right Ear: External ear normal.     Left Ear: External ear normal.     Nose: Nose normal.     Mouth/Throat:     Mouth: Mucous membranes are moist.     Pharynx: Oropharynx is clear.  Eyes:     Extraocular Movements: Extraocular movements intact.  Cardiovascular:     Rate and Rhythm: Normal rate.     Pulses: Normal pulses.  Pulmonary:     Effort: Pulmonary effort is normal.  Abdominal:     Comments: Deferred  Genitourinary:    Comments:  Deferred Musculoskeletal:     Cervical back: Normal range of motion.  Skin:    General: Skin is warm.  Neurological:     General: No focal deficit present.     Mental Status: He is alert and oriented to person, place, and time.  Psychiatric:        Mood and Affect: Mood normal.        Behavior: Behavior normal.        Thought Content: Thought content normal.    Review of Systems  Constitutional:  Negative for chills and fever.  HENT:  Negative for sore throat.   Eyes:  Negative for blurred vision.  Respiratory:  Negative for cough, sputum production, shortness of breath and wheezing.   Cardiovascular:  Negative for chest pain and palpitations.  Gastrointestinal:  Negative for heartburn and nausea.  Genitourinary:  Negative for dysuria and urgency.  Musculoskeletal:  Negative for falls.  Skin:  Negative for itching and rash.  Neurological:  Negative for dizziness and headaches.  Endo/Heme/Allergies:        See allergy listing  Psychiatric/Behavioral:  Positive for depression. Negative for hallucinations, substance abuse and suicidal ideas. The patient is nervous/anxious. The patient does not have insomnia.    Blood pressure 100/78, pulse 84, temperature 97.7 F (36.5 C), resp. rate 16, height 5\' 8"  (1.727 m), weight 73.5 kg, SpO2 98%. Body mass index is 24.63 kg/m.   Treatment Plan Summary: Patient is gradually coming off alcohol.  No complications so far.  He is tolerating his medications well.  No current dangerousness.  We will keep his medications the same.  Hopeful discharge by mid week if he maintains stability.  1.  Continue duloxetine  60 mg daily. 2.  Continue quetiapine  100 mg at bedtime. 3.  Continue gabapentin  600 mg 3 times daily. 4.  Continue naltrexone  50 mg daily. 5.  Continue to monitor mood behavior and interaction with others. 6.  Continue to encourage unit groups and therapeutic activities. 7.  Social worker will coordinate discharge and aftercare  planning.   Laurence Pons, FNP 04/08/2024, 4:18 PM Patient ID: Xavier Owens, male   DOB: 10/21/1971, 53 y.o.   MRN: [id number]

## 2024-04-08 NOTE — Progress Notes (Signed)
   04/08/24 0900  Psych Admission Type (Psych Patients Only)  Admission Status Voluntary  Psychosocial Assessment  Patient Complaints Depression  Eye Contact Fair  Facial Expression Flat  Affect Appropriate to circumstance  Speech Logical/coherent  Interaction Cautious  Motor Activity Slow  Appearance/Hygiene Unremarkable  Behavior Characteristics Cooperative;Calm  Mood Depressed;Anxious  Thought Process  Coherency WDL  Content WDL  Delusions None reported or observed  Perception WDL  Hallucination None reported or observed  Judgment WDL  Confusion None  Danger to Self  Current suicidal ideation? Denies  Agreement Not to Harm Self Yes  Description of Agreement Verbal Contract  Danger to Others  Danger to Others None reported or observed

## 2024-04-09 DIAGNOSIS — F332 Major depressive disorder, recurrent severe without psychotic features: Secondary | ICD-10-CM | POA: Diagnosis not present

## 2024-04-09 NOTE — Plan of Care (Signed)
   Problem: Education: Goal: Emotional status will improve Outcome: Not Progressing Goal: Mental status will improve Outcome: Not Progressing Goal: Verbalization of understanding the information provided will improve Outcome: Not Progressing

## 2024-04-09 NOTE — Progress Notes (Signed)
 [facility] MD Progress Note  04/09/2024 1:45 PM Xavier Owens  MRN:  [id number] Subjective:   53 year old African-American male, divorced, self-employed, lives alone. Extensive history of alcohol use disorder and multiple medical comorbidities related to alcohol use. Average BAL at previous presentations in the high 400s. History of withdrawal seizures in the past. Presented this time with BAL 257. Current presentation is on account of worsening depression associated with suicidal thoughts. He had not been fully adherent on his medications.  Chart reviewed today.  Patient discussed at multidisciplinary team meeting.  Nursing staff reports that he slept for 6 hours.  No challenging behavior.  He is has been detoxing appropriately from alcohol.   He has been adherent with his prescribed medications.  Today's assessment notes: On assessment today, Khyson is seen and examined today sitting up in a chair on the unit.  He presents alert, calm, cooperative and oriented to person, place, time, and situation.  Report his mood is sad and down, however, no withdrawal symptoms from alcohol observed during this assessment.  CIWA score of 0 x 2 today. He denies any current cravings for alcohol.  Patient reports his mood continues to be depressed and rates depression as #10/10 with 10 being high severity.  He continues on Cymbalta  60 mg p.o. daily for depression and Seroquel  100 mg p.o. q. nightly for mood stabilization.  Admitted that, "I have a lot going in my head."  However, unable to explain for number.  When inquired what we could do to help him, responded, "I do not know."  He denies delusional preoccupation or paranoia.  He further denies SI, HI, or AVH.  No rageful thoughts towards others or to property. Observed patient attending and participating passively in groups today.  No interest in alcohol rehabilitation treatment.   Encouraged to keep ventilating his feelings to staff. Reports that anxiety is #10/10, with  10 being high severity.  He continues on as needed medications of hydroxyzine  and gabapentin  Sleep is improved Appetite is good Concentration is better Energy level is adequate Denies having side effects to current psychiatric medications.   We discussed compliance to current medication regimen and after discharge from the hospital.  Principal Problem: MDD (major depressive disorder) Diagnosis: Principal Problem:   MDD (major depressive disorder)  Total Time spent with patient: 45 minutes  Past Psychiatric History:  See H&P  Past Medical History:  Past Medical History:  Diagnosis Date   Alcoholic (HCC)    Hypertension    Seizures (HCC)    Tremor     Past Surgical History:  Procedure Laterality Date   dislocated hip     HAND SURGERY     KNEE SURGERY     Family History:  Family History  Problem Relation Age of Onset   Cancer Mother    Cancer Father    Family Psychiatric  History:  See H&P  Social History:  Social History   Substance and Sexual Activity  Alcohol Use Yes   Comment: "a fifth of vodka every day for 30 years"     Social History   Substance and Sexual Activity  Drug Use Yes   Types: Marijuana    Social History   Socioeconomic History   Marital status: Married    Spouse name: Not on file   Number of children: Not on file   Years of education: Not on file   Highest education level: Not on file  Occupational History   Not on file  Tobacco Use  Smoking status: Every Day    Current packs/day: 0.25    Types: Cigarettes   Smokeless tobacco: Never  Vaping Use   Vaping status: Never Used  Substance and Sexual Activity   Alcohol use: Yes    Comment: "a fifth of vodka every day for 30 years"   Drug use: Yes    Types: Marijuana   Sexual activity: Yes  Other Topics Concern   Not on file  Social History Narrative   ** Merged History Encounter **       Social Drivers of Health   Financial Resource Strain: Not on file  Food Insecurity:  No Food Insecurity (04/03/2024)   Hunger Vital Sign    Worried About Running Out of Food in the Last Year: Never true    Ran Out of Food in the Last Year: Never true  Transportation Needs: No Transportation Needs (04/03/2024)   PRAPARE - Administrator, Civil Service (Medical): No    Lack of Transportation (Non-Medical): No  Physical Activity: Not on file  Stress: Not on file  Social Connections: Socially Integrated (01/24/2024)   Social Connection and Isolation Panel [NHANES]    Frequency of Communication with Friends and Family: More than three times a week    Frequency of Social Gatherings with Friends and Family: More than three times a week    Attends Religious Services: More than 4 times per year    Active Member of [employer] or Organizations: Yes    Attends Engineer, structural: More than 4 times per year    Marital Status: Living with partner   Additional Social History:   Current Medications: Current Facility-Administered Medications  Medication Dose Route Frequency Provider Last Rate Last Admin   acetaminophen  (TYLENOL ) tablet 650 mg  650 mg Oral Q6H PRN Weber, Kyra A, NP   650 mg at 04/08/24 0747   alum & mag hydroxide-simeth (MAALOX/MYLANTA) 200-200-20 MG/5ML suspension 30 mL  30 mL Oral Q4H PRN Weber, Kyra A, NP       Cholecalciferol  TABS 5,000 Units  1 tablet Oral Daily Weber, Kyra A, NP   5,000 Units at 04/09/24 1610   diclofenac  Sodium (VOLTAREN ) 1 % topical gel 2 g  2 g Topical Daily PRN Kainon Varady C, FNP       haloperidol  (HALDOL ) tablet 5 mg  5 mg Oral TID PRN Weber, Kyra A, NP       And   diphenhydrAMINE  (BENADRYL ) capsule 50 mg  50 mg Oral TID PRN Weber, Kyra A, NP       haloperidol  lactate (HALDOL ) injection 5 mg  5 mg Intramuscular TID PRN Weber, Kyra A, NP       And   diphenhydrAMINE  (BENADRYL ) injection 50 mg  50 mg Intramuscular TID PRN Weber, Kyra A, NP       And   LORazepam  (ATIVAN ) injection 2 mg  2 mg Intramuscular TID PRN Weber, Kyra A,  NP       haloperidol  lactate (HALDOL ) injection 10 mg  10 mg Intramuscular TID PRN Weber, Kyra A, NP       And   diphenhydrAMINE  (BENADRYL ) injection 50 mg  50 mg Intramuscular TID PRN Weber, Kyra A, NP       And   LORazepam  (ATIVAN ) injection 2 mg  2 mg Intramuscular TID PRN Weber, Kyra A, NP       DULoxetine  (CYMBALTA ) DR capsule 60 mg  60 mg Oral Daily Weber, Kyra A, NP  60 mg at 04/09/24 0981   ferrous sulfate  tablet 325 mg  325 mg Oral QHS Weber, Kyra A, NP   325 mg at 04/08/24 2117   gabapentin  (NEURONTIN ) capsule 600 mg  600 mg Oral TID Weber, Kyra A, NP   600 mg at 04/09/24 1130   hydrOXYzine  (ATARAX ) tablet 25 mg  25 mg Oral Q6H PRN Weber, Kyra A, NP   25 mg at 04/09/24 1133   magnesium  hydroxide (MILK OF MAGNESIA) suspension 30 mL  30 mL Oral Daily PRN Weber, Kyra A, NP       multivitamin with minerals tablet 1 tablet  1 tablet Oral Daily Izediuno, Iline Mallory, MD   1 tablet at 04/09/24 0839   naltrexone  (DEPADE) tablet 50 mg  50 mg Oral Daily Weber, Kyra A, NP   50 mg at 04/09/24 1914   pantoprazole  (PROTONIX ) EC tablet 40 mg  40 mg Oral Daily Weber, Kyra A, NP   40 mg at 04/09/24 0838   propranolol  (INDERAL ) tablet 10 mg  10 mg Oral BID Weber, Kyra A, NP   10 mg at 04/09/24 0838   QUEtiapine  (SEROQUEL ) tablet 100 mg  100 mg Oral QHS Weber, Kyra A, NP   100 mg at 04/08/24 2117   thiamine  (Vitamin B-1) tablet 100 mg  100 mg Oral Daily Zouev, Dmitri, MD   100 mg at 04/07/24 7829   thiamine  (VITAMIN B1) injection 100 mg  100 mg Intramuscular Once Izediuno, Iline Mallory, MD       traZODone  (DESYREL ) tablet 100 mg  100 mg Oral QHS Weber, Kyra A, NP   100 mg at 04/08/24 2117   Lab Results: No results found for this or any previous visit (from the past 48 hours).  Blood Alcohol level:  Lab Results  Component Value Date   ETH 257 (H) 04/02/2024   ETH <15 03/16/2024   Metabolic Disorder Labs: Lab Results  Component Value Date   HGBA1C 4.5 (L) 01/29/2024   MPG 82.45 01/29/2024   MPG 97  12/09/2021   No results found for: "PROLACTIN" Lab Results  Component Value Date   CHOL 169 04/04/2024   TRIG 195 (H) 04/04/2024   HDL 56 04/04/2024   CHOLHDL 3.0 04/04/2024   VLDL 39 04/04/2024   LDLCALC 74 04/04/2024   LDLCALC 91 01/29/2024    Physical Findings: AIMS:  , ,  ,  ,    CIWA:  CIWA-Ar Total: 0 COWS:     Musculoskeletal: Strength & Muscle Tone: within normal limits Gait & Station: Broad-based Patient leans: N/A  Psychiatric Specialty Exam:  Presentation  General Appearance:  Casually dressed, in bed prior to interview, not in any distress, appropriate behavior, engaged politely.  No tremors.  No confusion.  Eye Contact: Good.  Normal conjugate eye movement.  Speech: Spontaneous.  Soft spoken.  Mood and Affect  Mood: Worried but not pervasively down.  Affect: Restricted and appropriate.  Thought Process  Thought Processes: Linear and goal directed.  Descriptions of Associations:Intact  Orientation:Full (Time, Place and Person)  Thought Content: Negative ruminations.  Guilty ruminations.  No current suicidal thoughts.  Future-oriented.  No homicidal thoughts.  No thoughts of violence. No delusional theme.  No obsessions.  Hallucinations: No hallucination in any modality.  Sensorium  Memory: Good.  Judgment: Good.  Insight: Good  Executive Functions  Concentration: Good.  Attention Span: Good.  Recall: Good.  Fund of Knowledge: Good.  Language: Good  Psychomotor Activity  Normal psychomotor activity  Physical Exam: Physical Exam Vitals and nursing note reviewed.  HENT:     Head: Normocephalic.     Right Ear: External ear normal.     Left Ear: External ear normal.     Nose: Nose normal.     Mouth/Throat:     Mouth: Mucous membranes are moist.     Pharynx: Oropharynx is clear.  Eyes:     Extraocular Movements: Extraocular movements intact.  Cardiovascular:     Rate and Rhythm: Normal rate.     Pulses: Normal  pulses.  Pulmonary:     Effort: Pulmonary effort is normal.  Abdominal:     Comments: Deferred  Genitourinary:    Comments: Deferred Musculoskeletal:     Cervical back: Normal range of motion.  Skin:    General: Skin is warm.  Neurological:     General: No focal deficit present.     Mental Status: He is alert and oriented to person, place, and time.  Psychiatric:        Mood and Affect: Mood normal.        Behavior: Behavior normal.        Thought Content: Thought content normal.    Review of Systems  Constitutional:  Negative for chills and fever.  HENT:  Negative for sore throat.   Eyes:  Negative for blurred vision.  Respiratory:  Negative for cough, sputum production, shortness of breath and wheezing.   Cardiovascular:  Negative for chest pain and palpitations.  Gastrointestinal:  Negative for heartburn and nausea.  Genitourinary:  Negative for dysuria and urgency.  Musculoskeletal:  Negative for falls.  Skin:  Negative for itching and rash.  Neurological:  Negative for dizziness and headaches.  Endo/Heme/Allergies:        See allergy listing  Psychiatric/Behavioral:  Positive for depression. Negative for hallucinations, substance abuse and suicidal ideas. The patient is nervous/anxious. The patient does not have insomnia.    Blood pressure 99/69, pulse 76, temperature 98.3 F (36.8 C), temperature source Oral, resp. rate 14, height 5\' 8"  (1.727 m), weight 73.5 kg, SpO2 98%. Body mass index is 24.63 kg/m.  Treatment Plan Summary: Patient is gradually coming off alcohol.  No complications so far.  He is tolerating his medications well.  No current dangerousness.  We will keep his medications the same.  Hopeful discharge by mid week if he maintains stability.  1.  Continue duloxetine  60 mg daily. 2.  Continue quetiapine  100 mg at bedtime. 3.  Continue gabapentin  600 mg 3 times daily. 4.  Continue naltrexone  50 mg daily. 5.  Continue to monitor mood behavior and  interaction with others. 6.  Continue to encourage unit groups and therapeutic activities. 7.  Social worker will coordinate discharge and aftercare planning.   Laurence Pons, FNP 04/09/2024, 1:45 PM Patient ID: [name], male   DOB: [birth year], 53 y.o.   MRN: [id number] Patient ID: Curly Mackowski, male   DOB: 1971/06/30, 53 y.o.   MRN: [id number]

## 2024-04-09 NOTE — Progress Notes (Signed)
 Adult Psychoeducational Group Note  Date:  04/09/2024 Time:  9:32 PM  Group Topic/Focus:  Wrap-Up Group:   The focus of this group is to help patients review their daily goal of treatment and discuss progress on daily workbooks.  Participation Level:  Active  Participation Quality:  Appropriate  Affect:  Appropriate  Cognitive:  Appropriate  Insight: Appropriate  Engagement in Group:  Engaged  Modes of Intervention:  Discussion  Additional Comments:  He was happy to see  patients play ball in the gym  Xavier Owens 04/09/2024, 9:32 PM

## 2024-04-09 NOTE — Plan of Care (Signed)
 Problem: Education: Goal: Knowledge of [facility] Education information/materials will improve Outcome: Progressing Goal: Emotional status will improve Outcome: Progressing Goal: Mental status will improve Outcome: Progressing Goal: Verbalization of understanding the information provided will improve Outcome: Progressing   Problem: Activity: Goal: Interest or engagement in activities will improve Outcome: Progressing Goal: Sleeping patterns will improve Outcome: Progressing   Problem: Coping: Goal: Ability to verbalize frustrations and anger appropriately will improve Outcome: Progressing Goal: Ability to demonstrate self-control will improve Outcome: Progressing   Problem: Health Behavior/Discharge Planning: Goal: Identification of resources available to assist in meeting health care needs will improve Outcome: Progressing Goal: Compliance with treatment plan for underlying cause of condition will improve Outcome: Progressing   Problem: Physical Regulation: Goal: Ability to maintain clinical measurements within normal limits will improve Outcome: Progressing   Problem: Safety: Goal: Periods of time without injury will increase Outcome: Progressing   Problem: Activity: Goal: Will identify at least one activity in which they can participate Outcome: Progressing   Problem: Coping: Goal: Ability to identify and develop effective coping behavior will improve Outcome: Progressing Goal: Ability to interact with others will improve Outcome: Progressing Goal: Demonstration of participation in decision-making regarding own care will improve Outcome: Progressing Goal: Ability to use eye contact when communicating with others will improve Outcome: Progressing   Problem: Health Behavior/Discharge Planning: Goal: Identification of resources available to assist in meeting health care needs will improve Outcome: Progressing   Problem: Self-Concept: Goal: Will  verbalize positive feelings about self Outcome: Progressing   Problem: Education: Goal: Ability to make informed decisions regarding treatment will improve Outcome: Progressing   Problem: Coping: Goal: Coping ability will improve Outcome: Progressing   Problem: Health Behavior/Discharge Planning: Goal: Identification of resources available to assist in meeting health care needs will improve Outcome: Progressing   Problem: Medication: Goal: Compliance with prescribed medication regimen will improve Outcome: Progressing   Problem: Self-Concept: Goal: Ability to disclose and discuss suicidal ideas will improve Outcome: Progressing Goal: Will verbalize positive feelings about self Outcome: Progressing Note: Patient is on track. Patient will maintain adherence and work on increased adherence    Problem: Education: Goal: Knowledge of [facility] Education information/materials will improve Outcome: Progressing Goal: Emotional status will improve Outcome: Progressing Goal: Mental status will improve Outcome: Progressing Goal: Verbalization of understanding the information provided will improve Outcome: Progressing   Problem: Activity: Goal: Interest or engagement in activities will improve Outcome: Progressing Goal: Sleeping patterns will improve Outcome: Progressing   Problem: Coping: Goal: Ability to verbalize frustrations and anger appropriately will improve Outcome: Progressing Goal: Ability to demonstrate self-control will improve Outcome: Progressing   Problem: Health Behavior/Discharge Planning: Goal: Identification of resources available to assist in meeting health care needs will improve Outcome: Progressing Goal: Compliance with treatment plan for underlying cause of condition will improve Outcome: Progressing   Problem: Physical Regulation: Goal: Ability to maintain clinical measurements within normal limits will improve Outcome: Progressing    Problem: Safety: Goal: Periods of time without injury will increase Outcome: Progressing   Problem: Education: Goal: Knowledge of disease or condition will improve Outcome: Progressing Goal: Understanding of discharge needs will improve Outcome: Progressing   Problem: Health Behavior/Discharge Planning: Goal: Ability to identify changes in lifestyle to reduce recurrence of condition will improve Outcome: Progressing Goal: Identification of resources available to assist in meeting health care needs will improve Outcome: Progressing   Problem: Physical Regulation: Goal: Complications related to the disease process, condition or treatment will be avoided or  minimized Outcome: Progressing   Problem: Safety: Goal: Ability to remain free from injury will improve Outcome: Progressing

## 2024-04-09 NOTE — Progress Notes (Signed)
   04/09/24 2100  Psych Admission Type (Psych Patients Only)  Admission Status Voluntary  Psychosocial Assessment  Patient Complaints Depression  Eye Contact Fair  Facial Expression Flat  Affect Appropriate to circumstance  Speech Logical/coherent  Interaction Cautious  Motor Activity Slow  Appearance/Hygiene Unremarkable  Behavior Characteristics Cooperative  Mood Depressed  Thought Process  Coherency WDL  Content WDL  Delusions None reported or observed  Perception WDL  Hallucination None reported or observed  Judgment WDL  Confusion None  Danger to Self  Current suicidal ideation? Denies  Agreement Not to Harm Self Yes  Description of Agreement verbal  Danger to Others  Danger to Others None reported or observed

## 2024-04-09 NOTE — Group Note (Signed)
 LCSW Group Therapy Note   Group Date: 04/09/2024 Start Time: 1100 End Time: 1200  Participation:  patient was present.  He listened but didn't participate in the discussion.   Type of Therapy:  Group Therapy  Topic:  "Money Matters: Creating Stability, Confidence and Peace of Mind"  Objective: To help participants understand the impact of financial stability on well-being through the lens of Maslow's Hierarchy of Needs and develop practical strategies for budgeting, saving, and debt repayment.  Goals: Increase awareness of spending habits and financial priorities, recognizing how money supports basic needs, security, and relationships. Develop simple budgeting and saving strategies to enhance stability and peace of mind.  Reduce financial stress by creating a realistic debt repayment plan, supporting long-term confidence and well-being.  Summary:  Participants explored how financial stability connects to basic needs, relationships, and self-esteem using Maslow's Hierarchy. They discussed budgeting, saving, and debt repayment strategies, identifying small, manageable changes. Through interactive discussion and self-reflection, they gained insight into their financial habits and created personal action steps for improvement.  Therapeutic Modalities Used: Elements of Cognitive Behavioral Therapy (CBT) - Addressing financial stress and thought patterns. Psychoeducation - Engineer, agricultural. Elements of Motivational Interviewing (MI) - Encouraging realistic, achievable changes. Group Support - Reducing shame and stress through shared experiences.   Matei Magnone O Marcus Schwandt, LCSWA 04/09/2024  4:41 PM

## 2024-04-09 NOTE — Group Note (Signed)
 Recreation Therapy Group Note   Group Topic:Animal Assisted Therapy   Group Date: 04/09/2024 Start Time: 9604 End Time: 1030 Facilitators: Margrit Minner-McCall, LRT,CTRS Location: [address] Dayroom   Animal-Assisted Activity (AAA) Program Checklist/Progress Notes Patient Eligibility Criteria Checklist & Daily Group note for Rec Tx Intervention  AAA/T Program Assumption of Risk Form signed by Patient/ or Parent Legal Guardian Yes  Patient understands his/her participation is voluntary Yes  Behavioral Response:    Education: Charity fundraiser, Appropriate Animal Interaction   Education Outcome: Acknowledges education.    Affect/Mood: N/A   Participation Level: Did not attend    Clinical Observations/Individualized Feedback:     Plan: Continue to engage patient in RT group sessions 2-3x/week.   Bryne Lindon-McCall, LRT,CTRS 04/09/2024 11:57 AM

## 2024-04-09 NOTE — Group Note (Signed)
 Date:  04/09/2024 Time:  9:18 AM  Group Topic/Focus:  Goals Group:   The focus of this group is to help patients establish daily goals to achieve during treatment and discuss how the patient can incorporate goal setting into their daily lives to aide in recovery.    Participation Level:  Active  Participation Quality:  Appropriate  Affect:  Appropriate  Cognitive:  Appropriate  Insight: Good  Engagement in Group:  Engaged  Modes of Intervention:  Orientation  Additional Comments:    Xavier Owens 04/09/2024, 9:18 AM

## 2024-04-09 NOTE — Progress Notes (Signed)
   04/09/24 1000  Psych Admission Type (Psych Patients Only)  Admission Status Voluntary  Psychosocial Assessment  Patient Complaints Depression  Eye Contact Fair  Facial Expression Flat  Affect Appropriate to circumstance  Speech Logical/coherent  Interaction Cautious  Motor Activity Slow  Appearance/Hygiene Unremarkable  Behavior Characteristics Cooperative  Mood Depressed  Thought Process  Coherency WDL  Content WDL  Delusions None reported or observed  Perception WDL  Hallucination None reported or observed  Judgment WDL  Confusion None  Danger to Self  Current suicidal ideation? Denies  Danger to Others  Danger to Others None reported or observed   Dar Note: Patient presents with anxious affect and depressed mood.  Denies suicidal thoughts,  auditory and visual hallucinations.  Medications given as prescribed.  Routine safety checks maintained.  Requested and received Vistaril  25 mg for complain of anxiety with fair effect.  Patient is safe on and off the unit.

## 2024-04-10 ENCOUNTER — Encounter (HOSPITAL_COMMUNITY): Payer: Self-pay

## 2024-04-10 DIAGNOSIS — F332 Major depressive disorder, recurrent severe without psychotic features: Secondary | ICD-10-CM | POA: Diagnosis not present

## 2024-04-10 NOTE — Progress Notes (Signed)
   04/10/24 1945  Psych Admission Type (Psych Patients Only)  Admission Status Voluntary  Psychosocial Assessment  Patient Complaints None  Eye Contact Fair  Facial Expression Animated  Affect Appropriate to circumstance  Speech Logical/coherent  Interaction Assertive  Motor Activity Slow  Appearance/Hygiene Unremarkable  Behavior Characteristics Cooperative;Appropriate to situation  Mood Pleasant  Thought Process  Coherency WDL  Content WDL  Delusions None reported or observed  Perception WDL  Hallucination None reported or observed  Judgment WDL  Confusion None  Danger to Self  Current suicidal ideation? Denies  Agreement Not to Harm Self Yes  Description of Agreement verbal  Danger to Others  Danger to Others None reported or observed   Progress note   D: Pt seen outside dayroom. Pt denies SI, HI, AVH. Pt rates pain  7/10 as right knee pain. Pt rates anxiety  0/10 and depression  0/10. Denies any withdrawal symptoms. States he has normal appetite and is sleeping well. Attending groups and seen in milieu. No other concerns noted at this time.  A: Pt provided support and encouragement. Pt given scheduled medication as prescribed. PRNs as appropriate. Q15 min checks for safety.   R: Pt safe on the unit. Will continue to monitor.

## 2024-04-10 NOTE — Progress Notes (Signed)
 [facility] MD Progress Note  04/10/2024 2:02 PM Xavier Owens  MRN:  [id number] Subjective:   53 year old African-American male, divorced, self-employed, lives alone. Extensive history of alcohol use disorder and multiple medical comorbidities related to alcohol use. Average BAL at previous presentations in the high 400s. History of withdrawal seizures in the past. Presented this time with BAL 257. Current presentation is on account of worsening depression associated with suicidal thoughts. He had not been fully adherent on his medications.  Chart reviewed today.  Patient discussed at multidisciplinary team meeting.  Nursing staff reports that he slept for 6 hours.  No challenging behavior.  He is has been detoxing appropriately from alcohol.  He has been adherent with his prescribed medications.  Today's assessment notes: He presents alert, calm, cooperative and oriented to person, place, time, and situation. Report his mood is improving and no withdrawal symptoms from alcohol observed during this assessment. CIWA score of 0 today.  He rates depression as numbers 0/10, with 10 being high severity. He denies any current cravings for alcohol. He continues on Cymbalta  60 mg p.o. daily for depression and Seroquel  100 mg p.o. q. nightly for mood stabilization.  Patient reports, "I took hydroxyzine  yesterday and it helped with my anxiety.  Overall today I feel okay."  He denies delusional preoccupation or paranoia.  He further denies SI, HI, or AVH.  No rageful thoughts towards others or to property. Observed patient attending and participating passively in groups today. Encouraged to keep ventilating his feelings to staff.  No interest in alcohol rehabilitation treatment at this time. Reports that anxiety is at manageable level he.  He continues on as needed medications of hydroxyzine  and gabapentin  Sleep is improved Appetite is good Concentration is better Energy level is adequate Denies having side  effects to current psychiatric medications.   We discussed compliance to current medication regimen and after discharge from the hospital.  Principal Problem: MDD (major depressive disorder) Diagnosis: Principal Problem:   MDD (major depressive disorder)  Total Time spent with patient: 45 minutes  Past Psychiatric History:  See H&P  Past Medical History:  Past Medical History:  Diagnosis Date   Alcoholic (HCC)    Hypertension    Seizures (HCC)    Tremor     Past Surgical History:  Procedure Laterality Date   dislocated hip     HAND SURGERY     KNEE SURGERY     Family History:  Family History  Problem Relation Age of Onset   Cancer Mother    Cancer Father    Family Psychiatric  History:  See H&P  Social History:  Social History   Substance and Sexual Activity  Alcohol Use Yes   Comment: "a fifth of vodka every day for 30 years"     Social History   Substance and Sexual Activity  Drug Use Yes   Types: Marijuana    Social History   Socioeconomic History   Marital status: Married    Spouse name: Not on file   Number of children: Not on file   Years of education: Not on file   Highest education level: Not on file  Occupational History   Not on file  Tobacco Use   Smoking status: Every Day    Current packs/day: 0.25    Types: Cigarettes   Smokeless tobacco: Never  Vaping Use   Vaping status: Never Used  Substance and Sexual Activity   Alcohol use: Yes    Comment: "a fifth of vodka  every day for 30 years"   Drug use: Yes    Types: Marijuana   Sexual activity: Yes  Other Topics Concern   Not on file  Social History Narrative   ** Merged History Encounter **       Social Drivers of Health   Financial Resource Strain: Not on file  Food Insecurity: No Food Insecurity (04/03/2024)   Hunger Vital Sign    Worried About Running Out of Food in the Last Year: Never true    Ran Out of Food in the Last Year: Never true  Transportation Needs: No  Transportation Needs (04/03/2024)   PRAPARE - Administrator, Civil Service (Medical): No    Lack of Transportation (Non-Medical): No  Physical Activity: Not on file  Stress: Not on file  Social Connections: Socially Integrated (01/24/2024)   Social Connection and Isolation Panel [NHANES]    Frequency of Communication with Friends and Family: More than three times a week    Frequency of Social Gatherings with Friends and Family: More than three times a week    Attends Religious Services: More than 4 times per year    Active Member of [employer] or Organizations: Yes    Attends Engineer, structural: More than 4 times per year    Marital Status: Living with partner   Additional Social History:   Current Medications: Current Facility-Administered Medications  Medication Dose Route Frequency Provider Last Rate Last Admin   acetaminophen  (TYLENOL ) tablet 650 mg  650 mg Oral Q6H PRN Weber, Kyra A, NP   650 mg at 04/08/24 0747   alum & mag hydroxide-simeth (MAALOX/MYLANTA) 200-200-20 MG/5ML suspension 30 mL  30 mL Oral Q4H PRN Weber, Kyra A, NP       Cholecalciferol  TABS 5,000 Units  1 tablet Oral Daily Weber, Kyra A, NP   5,000 Units at 04/10/24 1129   diclofenac  Sodium (VOLTAREN ) 1 % topical gel 2 g  2 g Topical Daily PRN Dorlisa Savino C, FNP       haloperidol  (HALDOL ) tablet 5 mg  5 mg Oral TID PRN Weber, Kyra A, NP       And   diphenhydrAMINE  (BENADRYL ) capsule 50 mg  50 mg Oral TID PRN Weber, Kyra A, NP       haloperidol  lactate (HALDOL ) injection 5 mg  5 mg Intramuscular TID PRN Weber, Kyra A, NP       And   diphenhydrAMINE  (BENADRYL ) injection 50 mg  50 mg Intramuscular TID PRN Weber, Kyra A, NP       And   LORazepam  (ATIVAN ) injection 2 mg  2 mg Intramuscular TID PRN Weber, Kyra A, NP       haloperidol  lactate (HALDOL ) injection 10 mg  10 mg Intramuscular TID PRN Weber, Kyra A, NP       And   diphenhydrAMINE  (BENADRYL ) injection 50 mg  50 mg Intramuscular TID PRN Weber,  Kyra A, NP       And   LORazepam  (ATIVAN ) injection 2 mg  2 mg Intramuscular TID PRN Weber, Kyra A, NP       DULoxetine  (CYMBALTA ) DR capsule 60 mg  60 mg Oral Daily Weber, Kyra A, NP   60 mg at 04/10/24 0817   ferrous sulfate  tablet 325 mg  325 mg Oral QHS Weber, Kyra A, NP   325 mg at 04/09/24 2118   gabapentin  (NEURONTIN ) capsule 600 mg  600 mg Oral TID Weber, Kyra A, NP   600  mg at 04/10/24 1129   hydrOXYzine  (ATARAX ) tablet 25 mg  25 mg Oral Q6H PRN Weber, Kyra A, NP   25 mg at 04/10/24 1610   magnesium  hydroxide (MILK OF MAGNESIA) suspension 30 mL  30 mL Oral Daily PRN Weber, Kyra A, NP       multivitamin with minerals tablet 1 tablet  1 tablet Oral Daily Izediuno, Iline Mallory, MD   1 tablet at 04/10/24 0817   naltrexone  (DEPADE) tablet 50 mg  50 mg Oral Daily Weber, Kyra A, NP   50 mg at 04/10/24 0817   pantoprazole  (PROTONIX ) EC tablet 40 mg  40 mg Oral Daily Weber, Kyra A, NP   40 mg at 04/10/24 9604   propranolol  (INDERAL ) tablet 10 mg  10 mg Oral BID Weber, Kyra A, NP   10 mg at 04/10/24 5409   QUEtiapine  (SEROQUEL ) tablet 100 mg  100 mg Oral QHS Weber, Kyra A, NP   100 mg at 04/09/24 2118   thiamine  (Vitamin B-1) tablet 100 mg  100 mg Oral Daily Zouev, Dmitri, MD   100 mg at 04/10/24 8119   thiamine  (VITAMIN B1) injection 100 mg  100 mg Intramuscular Once Izediuno, Iline Mallory, MD       traZODone  (DESYREL ) tablet 100 mg  100 mg Oral QHS Weber, Kyra A, NP   100 mg at 04/09/24 2118   Lab Results: No results found for this or any previous visit (from the past 48 hours).  Blood Alcohol level:  Lab Results  Component Value Date   ETH 257 (H) 04/02/2024   ETH <15 03/16/2024   Metabolic Disorder Labs: Lab Results  Component Value Date   HGBA1C 4.5 (L) 01/29/2024   MPG 82.45 01/29/2024   MPG 97 12/09/2021   No results found for: "PROLACTIN" Lab Results  Component Value Date   CHOL 169 04/04/2024   TRIG 195 (H) 04/04/2024   HDL 56 04/04/2024   CHOLHDL 3.0 04/04/2024   VLDL 39  04/04/2024   LDLCALC 74 04/04/2024   LDLCALC 91 01/29/2024   Physical Findings: AIMS:  , ,  ,  ,    CIWA:  CIWA-Ar Total: 0 COWS:     Musculoskeletal: Strength & Muscle Tone: within normal limits Gait & Station: Broad-based Patient leans: N/A  Psychiatric Specialty Exam:  Presentation  General Appearance:  Casually dressed, in bed prior to interview, not in any distress, appropriate behavior, engaged politely.  No tremors.  No confusion.  Eye Contact: Good.  Normal conjugate eye movement.  Speech: Spontaneous.  Soft spoken.  Mood and Affect  Mood: Worried but not pervasively down.  Affect: Restricted and appropriate.  Thought Process  Thought Processes: Linear and goal directed.  Descriptions of Associations:Intact  Orientation:Full (Time, Place and Person)  Thought Content: Negative ruminations.  Guilty ruminations.  No current suicidal thoughts.  Future-oriented.  No homicidal thoughts.  No thoughts of violence. No delusional theme.  No obsessions.  Hallucinations: No hallucination in any modality.  Sensorium  Memory: Good.  Judgment: Good.  Insight: Good  Executive Functions  Concentration: Good.  Attention Span: Good.  Recall: Good.  Fund of Knowledge: Good.  Language: Good  Psychomotor Activity  Normal psychomotor activity  Physical Exam: Physical Exam Vitals and nursing note reviewed.  Constitutional:      General: He is not in acute distress.    Appearance: He is not ill-appearing.  HENT:     Head: Normocephalic.     Right Ear: External ear normal.  Left Ear: External ear normal.     Nose: Nose normal.     Mouth/Throat:     Mouth: Mucous membranes are moist.     Pharynx: Oropharynx is clear.  Eyes:     Extraocular Movements: Extraocular movements intact.  Cardiovascular:     Rate and Rhythm: Normal rate.     Pulses: Normal pulses.  Pulmonary:     Effort: Pulmonary effort is normal.  Abdominal:     Comments:  Deferred  Genitourinary:    Comments: Deferred Musculoskeletal:        General: Normal range of motion.     Cervical back: Normal range of motion.  Skin:    General: Skin is warm.  Neurological:     General: No focal deficit present.     Mental Status: He is alert and oriented to person, place, and time.  Psychiatric:        Mood and Affect: Mood normal.        Behavior: Behavior normal.        Thought Content: Thought content normal.    Review of Systems  Constitutional:  Negative for chills and fever.  HENT:  Negative for sore throat.   Eyes:  Negative for blurred vision.  Respiratory:  Negative for cough, sputum production, shortness of breath and wheezing.   Cardiovascular:  Negative for chest pain and palpitations.  Gastrointestinal:  Negative for heartburn and nausea.  Genitourinary:  Negative for dysuria and urgency.  Musculoskeletal:  Negative for falls.  Skin:  Negative for itching and rash.  Neurological:  Negative for dizziness and headaches.  Endo/Heme/Allergies:        See allergy listing  Psychiatric/Behavioral:  Positive for depression. Negative for hallucinations, substance abuse and suicidal ideas. The patient is nervous/anxious. The patient does not have insomnia.    Blood pressure 106/70, pulse 84, temperature 98.5 F (36.9 C), temperature source Oral, resp. rate 16, height 5\' 8"  (1.727 m), weight 73.5 kg, SpO2 98%. Body mass index is 24.63 kg/m.  Treatment Plan Summary: Patient is gradually coming off alcohol.  No complications so far.  He is tolerating his medications well.  No current dangerousness.  We will keep his medications the same.  Hopeful discharge by mid week if he maintains stability.  1.  Continue duloxetine  60 mg daily. 2.  Continue quetiapine  100 mg at bedtime. 3.  Continue gabapentin  600 mg 3 times daily. 4.  Continue naltrexone  50 mg daily. 5.  Continue to monitor mood behavior and interaction with others. 6.  Continue to encourage  unit groups and therapeutic activities. 7.  Social worker will coordinate discharge and aftercare planning.   Laurence Pons, FNP 04/10/2024, 2:02 PM Patient ID: [name], male   DOB: July 11, 1971, 53 y.o.   MRN: [id number] Patient ID: Eldredge Veldhuizen, male   DOB: [birth year], 53 y.o.   MRN: [id number] Patient ID: Armondo Cech, male   DOB: 08-Dec-1970, 53 y.o.   MRN: [id number]

## 2024-04-10 NOTE — BH IP Treatment Plan (Signed)
 Interdisciplinary Treatment and Diagnostic Plan Update  04/10/2024 Time of Session: 12:05 PM - UPDATE Xavier Owens MRN: [id number]  Principal Diagnosis: MDD (major depressive disorder)  Secondary Diagnoses: Principal Problem:   MDD (major depressive disorder)   Current Medications:  Current Facility-Administered Medications  Medication Dose Route Frequency Provider Last Rate Last Admin   acetaminophen  (TYLENOL ) tablet 650 mg  650 mg Oral Q6H PRN Weber, Kyra A, NP   650 mg at 04/08/24 0747   alum & mag hydroxide-simeth (MAALOX/MYLANTA) 200-200-20 MG/5ML suspension 30 mL  30 mL Oral Q4H PRN Weber, Kyra A, NP       Cholecalciferol  TABS 5,000 Units  1 tablet Oral Daily Weber, Kyra A, NP   5,000 Units at 04/10/24 1129   diclofenac  Sodium (VOLTAREN ) 1 % topical gel 2 g  2 g Topical Daily PRN Ntuen, Tina C, FNP       haloperidol  (HALDOL ) tablet 5 mg  5 mg Oral TID PRN Weber, Kyra A, NP       And   diphenhydrAMINE  (BENADRYL ) capsule 50 mg  50 mg Oral TID PRN Weber, Kyra A, NP       haloperidol  lactate (HALDOL ) injection 5 mg  5 mg Intramuscular TID PRN Weber, Kyra A, NP       And   diphenhydrAMINE  (BENADRYL ) injection 50 mg  50 mg Intramuscular TID PRN Weber, Kyra A, NP       And   LORazepam  (ATIVAN ) injection 2 mg  2 mg Intramuscular TID PRN Weber, Kyra A, NP       haloperidol  lactate (HALDOL ) injection 10 mg  10 mg Intramuscular TID PRN Weber, Kyra A, NP       And   diphenhydrAMINE  (BENADRYL ) injection 50 mg  50 mg Intramuscular TID PRN Weber, Kyra A, NP       And   LORazepam  (ATIVAN ) injection 2 mg  2 mg Intramuscular TID PRN Weber, Kyra A, NP       DULoxetine  (CYMBALTA ) DR capsule 60 mg  60 mg Oral Daily Weber, Kyra A, NP   60 mg at 04/10/24 0817   ferrous sulfate  tablet 325 mg  325 mg Oral QHS Weber, Kyra A, NP   325 mg at 04/09/24 2118   gabapentin  (NEURONTIN ) capsule 600 mg  600 mg Oral TID Weber, Kyra A, NP   600 mg at 04/10/24 1129   hydrOXYzine  (ATARAX ) tablet 25 mg  25 mg  Oral Q6H PRN Weber, Kyra A, NP   25 mg at 04/10/24 1507   magnesium  hydroxide (MILK OF MAGNESIA) suspension 30 mL  30 mL Oral Daily PRN Weber, Kyra A, NP       multivitamin with minerals tablet 1 tablet  1 tablet Oral Daily Izediuno, Iline Mallory, MD   1 tablet at 04/10/24 0817   naltrexone  (DEPADE) tablet 50 mg  50 mg Oral Daily Weber, Kyra A, NP   50 mg at 04/10/24 0817   pantoprazole  (PROTONIX ) EC tablet 40 mg  40 mg Oral Daily Weber, Kyra A, NP   40 mg at 04/10/24 4098   propranolol  (INDERAL ) tablet 10 mg  10 mg Oral BID Weber, Kyra A, NP   10 mg at 04/10/24 1191   QUEtiapine  (SEROQUEL ) tablet 100 mg  100 mg Oral QHS Weber, Kyra A, NP   100 mg at 04/09/24 2118   thiamine  (Vitamin B-1) tablet 100 mg  100 mg Oral Daily Zouev, Dmitri, MD   100 mg at 04/10/24 0817   thiamine  (VITAMIN  B1) injection 100 mg  100 mg Intramuscular Once Izediuno, Iline Mallory, MD       traZODone  (DESYREL ) tablet 100 mg  100 mg Oral QHS Weber, Kyra A, NP   100 mg at 04/09/24 2118   PTA Medications: Medications Prior to Admission  Medication Sig Dispense Refill Last Dose/Taking   Cholecalciferol  125 MCG (5000 UT) TABS Take 1 tablet (5,000 Units total) by mouth daily. (Patient not taking: Reported on 04/03/2024) 90 tablet 0    diclofenac  Sodium (VOLTAREN ) 1 % GEL Apply 2 g topically 4 (four) times daily. (Patient not taking: Reported on 04/03/2024) 2 g 0    DULoxetine  (CYMBALTA ) 60 MG capsule Take 1 capsule (60 mg total) by mouth daily. (Patient not taking: Reported on 04/03/2024) 30 capsule 0    ferrous sulfate  325 (65 FE) MG tablet Take 1 tablet (325 mg total) by mouth at bedtime. (Patient not taking: Reported on 04/03/2024) 30 tablet 0    folic acid  (FOLVITE ) 1 MG tablet Take 1 mg by mouth daily. (Patient not taking: Reported on 04/03/2024)      gabapentin  (NEURONTIN ) 300 MG capsule Take 2 capsules (600 mg total) by mouth 3 (three) times daily. 360 capsule 3    hydrOXYzine  (ATARAX ) 25 MG tablet Take 1 tablet (25 mg total) by  mouth every 6 (six) hours as needed for anxiety (or CIWA score </= 10). (Patient not taking: Reported on 04/03/2024) 30 tablet 0    Multiple Vitamin (MULTIVITAMIN WITH MINERALS) TABS tablet Take 1 tablet by mouth daily. (Patient not taking: Reported on 04/03/2024) 90 tablet 0    Multiple Vitamins-Minerals (CERTAVITE/ANTIOXIDANTS) TABS Take 1-2 tablets by mouth daily (Patient not taking: Reported on 04/03/2024) 30 tablet 0    naltrexone  (DEPADE) 50 MG tablet Take 1 tablet (50 mg total) by mouth daily. 90 tablet 3    pantoprazole  (PROTONIX ) 40 MG tablet Take 1 tablet (40 mg total) by mouth daily. (Patient not taking: Reported on 04/03/2024) 30 tablet 0    propranolol  (INDERAL ) 10 MG tablet Take 1 tablet (10 mg total) by mouth 2 (two) times daily. (Patient not taking: Reported on 04/03/2024) 60 tablet 0    QUEtiapine  (SEROQUEL ) 100 MG tablet Take 1 tablet (100 mg total) by mouth at bedtime. (Patient not taking: Reported on 04/03/2024) 30 tablet 0    senna-docusate (SENOKOT-S) 8.6-50 MG tablet Take 2 tablets by mouth 2 (two) times daily. (Patient not taking: Reported on 04/03/2024) 120 tablet 0    thiamine  (VITAMIN B1) 100 MG tablet Take 1 tablet (100 mg total) by mouth daily. (Patient not taking: Reported on 04/03/2024) 30 tablet 0    traZODone  (DESYREL ) 100 MG tablet Take 1 tablet (100 mg total) by mouth at bedtime. (Patient not taking: Reported on 04/03/2024) 30 tablet 0     Patient Stressors: Health problems   Medication change or noncompliance   Substance abuse    Patient Strengths: Capable of independent living  Communication skills  Supportive family/friends   Treatment Modalities: Medication Management, Group therapy, Case management,  1 to 1 session with clinician, Psychoeducation, Recreational therapy.   Physician Treatment Plan for Primary Diagnosis: MDD (major depressive disorder) Long Term Goal(s): Improvement in symptoms so as ready for discharge   Short Term Goals: Ability to identify  changes in lifestyle to reduce recurrence of condition will improve Ability to verbalize feelings will improve Ability to disclose and discuss suicidal ideas Ability to demonstrate self-control will improve Ability to identify and develop effective coping behaviors will improve  Ability to maintain clinical measurements within normal limits will improve Compliance with prescribed medications will improve Ability to identify triggers associated with substance abuse/mental health issues will improve  Medication Management: Evaluate patient's response, side effects, and tolerance of medication regimen.  Therapeutic Interventions: 1 to 1 sessions, Unit Group sessions and Medication administration.  Evaluation of Outcomes: Progressing  Physician Treatment Plan for Secondary Diagnosis: Principal Problem:   MDD (major depressive disorder)  Long Term Goal(s): Improvement in symptoms so as ready for discharge   Short Term Goals: Ability to identify changes in lifestyle to reduce recurrence of condition will improve Ability to verbalize feelings will improve Ability to disclose and discuss suicidal ideas Ability to demonstrate self-control will improve Ability to identify and develop effective coping behaviors will improve Ability to maintain clinical measurements within normal limits will improve Compliance with prescribed medications will improve Ability to identify triggers associated with substance abuse/mental health issues will improve     Medication Management: Evaluate patient's response, side effects, and tolerance of medication regimen.  Therapeutic Interventions: 1 to 1 sessions, Unit Group sessions and Medication administration.  Evaluation of Outcomes: Progressing   RN Treatment Plan for Primary Diagnosis: MDD (major depressive disorder) Long Term Goal(s): Knowledge of disease and therapeutic regimen to maintain health will improve  Short Term Goals: Ability to remain free  from injury will improve, Ability to verbalize frustration and anger appropriately will improve, Ability to verbalize feelings will improve, and Ability to disclose and discuss suicidal ideas  Medication Management: RN will administer medications as ordered by provider, will assess and evaluate patient's response and provide education to patient for prescribed medication. RN will report any adverse and/or side effects to prescribing provider.  Therapeutic Interventions: 1 on 1 counseling sessions, Psychoeducation, Medication administration, Evaluate responses to treatment, Monitor vital signs and CBGs as ordered, Perform/monitor CIWA, COWS, AIMS and Fall Risk screenings as ordered, Perform wound care treatments as ordered.  Evaluation of Outcomes: Progressing   LCSW Treatment Plan for Primary Diagnosis: MDD (major depressive disorder) Long Term Goal(s): Safe transition to appropriate next level of care at discharge, Engage patient in therapeutic group addressing interpersonal concerns.  Short Term Goals: Engage patient in aftercare planning with referrals and resources, Increase ability to appropriately verbalize feelings, Facilitate acceptance of mental health diagnosis and concerns, and Identify triggers associated with mental health/substance abuse issues  Therapeutic Interventions: Assess for all discharge needs, 1 to 1 time with Social worker, Explore available resources and support systems, Assess for adequacy in community support network, Educate family and significant other(s) on suicide prevention, Complete Psychosocial Assessment, Interpersonal group therapy.  Evaluation of Outcomes: Progressing   Progress in Treatment: Attending groups: Attended some groups. Participating in groups: Yes. Taking medication as prescribed: Yes. Toleration medication: Yes. Family/Significant other contact made: Yes, contacted Flordia Hung (fiancee) [phone] Patient understands diagnosis:  Yes. Discussing patient identified problems/goals with staff: Yes. Medical problems stabilized or resolved: Yes. Denies suicidal/homicidal ideation: Yes. Issues/concerns per patient self-inventory: No.   New problem(s) identified:  No   New Short Term/Long Term Goal(s):     medication stabilization, elimination of SI thoughts, development of comprehensive mental wellness plan.      Patient Goals:  "I want to be discharged from the hospital.  I don't want to go to rehab."     Discharge Plan or Barriers:  Patient recently admitted. CSW will continue to follow and assess for appropriate referrals and possible discharge planning.      Reason for Continuation of  Hospitalization: Depression Medication stabilization Suicidal ideation   Estimated Length of Stay:  1 - 2 days  Last 3 [place] Suicide Severity Risk Score: Flowsheet Row [facility] (Current) [facility]20[facility] ED to Hosp-Admission (Discharged) from 03/16/2024 in [facility]  C-SSRS RISK CATEGORY Low Risk Low Risk No Risk       Last PHQ 2/9 Scores:    02/07/2024   10:22 AM 01/23/2024   10:12 AM 01/18/2024   12:26 AM  Depression screen PHQ 2/9  Decreased Interest 0 0 0  Down, Depressed, Hopeless 0 0 3  PHQ - 2 Score 0 0 3  Altered sleeping  1 3  Tired, decreased energy  1 0  Change in appetite  0 3  Feeling bad or failure about yourself   0 3  Trouble concentrating  1 3  Moving slowly or fidgety/restless  0 0  Suicidal thoughts  0 0  PHQ-9 Score  3 15  Difficult doing work/chores   Extremely dIfficult    Scribe for Treatment Team: Masiel Gentzler O Kendarrius Tanzi, LCSWA 04/10/2024 5:14 PM

## 2024-04-10 NOTE — Progress Notes (Signed)
  Xavier Owens   Type of Note: Discharge planning  Patient plans to return to his address on file where he lives alone.  Pt's friend who took his medications to [place]  was able to ship it back to pt's address. Meds are scheduled to arrive there on Friday.    Signed:  Tariq Pernell, LCSW-A 04/10/2024  3:53 PM

## 2024-04-10 NOTE — Group Note (Unsigned)
 Therapy Group Note  Group Topic:Other  Group Date: 04/10/2024 Start Time: 1430 End Time: 1518 Facilitators: Filip Luten G, OT    Group OT session titled "Media Detox Challenge" was conducted with approximately 20 adolescent patients in the inpatient behavioral health unit. The group focused on exploring the impact of media/screen use on emotional regulation, occupational balance, and routine development. Patients were provided with structured handouts and guided through a step-by-step process that included individual reflection, small group planning, and large group discussion. Tasks included identifying current screen time habits, emotional effects of media use, and collaboratively designing a 24-hour "media detox" plan with non-screen-based alternatives.      Participation Level: {OT B[facility] Participation RUEAV:40981}   Participation Quality: {OT [facility] Participation Quality:26268}   Behavior: {BHH OT Group Behavior:26269}   Speech/Thought Process: {BHH OT Speech/Thought Process:26270}   Affect/Mood: {OT B[facility] Affect/Mood:26271}   Insight: {OT BHH Insight:26272}   Judgement: {OT BHH Judgement:26272}   Individualization: *** was *** in their participation of group discussion/activity. *** identified  Modes of Intervention: {BHH MODES OF INTERVENTION:26273}  Patient Response to Interventions:  {BHH OT Patient Response to Interventions:26274}   Plan: Continue to engage patient in OT groups 2 - 3x/week.  04/10/2024  Lynnda Sas, OT

## 2024-04-10 NOTE — Plan of Care (Signed)
  Problem: Education: Goal: Emotional status will improve 04/10/2024 1841 by Jeoffrey Mole, RN Outcome: Progressing 04/10/2024 1747 by Jeoffrey Mole, RN Outcome: Progressing   Problem: Activity: Goal: Interest or engagement in activities will improve 04/10/2024 1841 by Jeoffrey Mole, RN Outcome: Progressing 04/10/2024 1747 by Jeoffrey Mole, RN Outcome: Progressing

## 2024-04-10 NOTE — Plan of Care (Signed)
 Problem: Education: Goal: Knowledge of [facility] Education information/materials will improve Outcome: Progressing Goal: Emotional status will improve Outcome: Progressing Goal: Mental status will improve Outcome: Progressing Goal: Verbalization of understanding the information provided will improve Outcome: Progressing   Problem: Activity: Goal: Interest or engagement in activities will improve Outcome: Progressing Goal: Sleeping patterns will improve Outcome: Progressing   Problem: Coping: Goal: Ability to verbalize frustrations and anger appropriately will improve Outcome: Progressing Goal: Ability to demonstrate self-control will improve Outcome: Progressing   Problem: Health Behavior/Discharge Planning: Goal: Identification of resources available to assist in meeting health care needs will improve Outcome: Progressing Goal: Compliance with treatment plan for underlying cause of condition will improve Outcome: Progressing   Problem: Physical Regulation: Goal: Ability to maintain clinical measurements within normal limits will improve Outcome: Progressing   Problem: Safety: Goal: Periods of time without injury will increase Outcome: Progressing   Problem: Activity: Goal: Will identify at least one activity in which they can participate Outcome: Progressing   Problem: Coping: Goal: Ability to identify and develop effective coping behavior will improve Outcome: Progressing Goal: Ability to interact with others will improve Outcome: Progressing Goal: Demonstration of participation in decision-making regarding own care will improve Outcome: Progressing Goal: Ability to use eye contact when communicating with others will improve Outcome: Progressing   Problem: Health Behavior/Discharge Planning: Goal: Identification of resources available to assist in meeting health care needs will improve Outcome: Progressing   Problem: Self-Concept: Goal: Will  verbalize positive feelings about self Outcome: Progressing   Problem: Education: Goal: Ability to make informed decisions regarding treatment will improve Outcome: Progressing   Problem: Coping: Goal: Coping ability will improve Outcome: Progressing   Problem: Health Behavior/Discharge Planning: Goal: Identification of resources available to assist in meeting health care needs will improve Outcome: Progressing   Problem: Medication: Goal: Compliance with prescribed medication regimen will improve Outcome: Progressing   Problem: Self-Concept: Goal: Ability to disclose and discuss suicidal ideas will improve Outcome: Progressing Goal: Will verbalize positive feelings about self Outcome: Progressing Note: Patient is on track. Patient will maintain adherence    Problem: Education: Goal: Knowledge of [facility] Education information/materials will improve Outcome: Progressing Goal: Emotional status will improve Outcome: Progressing Goal: Mental status will improve Outcome: Progressing Goal: Verbalization of understanding the information provided will improve Outcome: Progressing   Problem: Activity: Goal: Interest or engagement in activities will improve Outcome: Progressing Goal: Sleeping patterns will improve Outcome: Progressing   Problem: Coping: Goal: Ability to verbalize frustrations and anger appropriately will improve Outcome: Progressing Goal: Ability to demonstrate self-control will improve Outcome: Progressing   Problem: Health Behavior/Discharge Planning: Goal: Identification of resources available to assist in meeting health care needs will improve Outcome: Progressing Goal: Compliance with treatment plan for underlying cause of condition will improve Outcome: Progressing   Problem: Physical Regulation: Goal: Ability to maintain clinical measurements within normal limits will improve Outcome: Progressing   Problem: Safety: Goal: Periods of  time without injury will increase Outcome: Progressing   Problem: Education: Goal: Knowledge of disease or condition will improve Outcome: Progressing Goal: Understanding of discharge needs will improve Outcome: Progressing   Problem: Health Behavior/Discharge Planning: Goal: Ability to identify changes in lifestyle to reduce recurrence of condition will improve Outcome: Progressing Goal: Identification of resources available to assist in meeting health care needs will improve Outcome: Progressing   Problem: Physical Regulation: Goal: Complications related to the disease process, condition or treatment will be avoided or minimized Outcome: Progressing  Problem: Safety: Goal: Ability to remain free from injury will improve Outcome: Progressing

## 2024-04-10 NOTE — Group Note (Signed)
 Recreation Therapy Group Note   Group Topic:Communication  Group Date: 04/10/2024 Start Time: 0945 End Time: 1009 Facilitators: Jeannett Dekoning-McCall, LRT,CTRS Location: [address] Day[address]   Group Topic: Communication, Problem Solving   Goal Area(s) Addresses:  Patient will effectively listen to complete activity.  Patient will identify communication skills used to make activity successful.  Patient will identify how skills used during activity can be used to reach post d/c goals.    Behavioral Response: None  Intervention: Building surveyor Activity - Geometric pattern cards, pencils, blank paper    Activity: Geometric Drawings.  Three volunteers from the peer group will be shown an abstract picture with a particular arrangement of geometrical shapes.  Each round, one 'speaker' will describe the pattern, as accurately as possible without revealing the image to the group.  The remaining group members will listen and draw the picture to reflect how it is described to them. Patients with the role of 'listener' cannot ask clarifying questions but, may request that the speaker repeat a direction. Once the drawings are complete, the presenter will show the rest of the group the picture and compare how close each person came to drawing the picture. LRT will facilitate a post-activity discussion regarding effective communication and the importance of planning, listening, and asking for clarification in daily interactions with others.  Education: Environmental consultant, Active listening, Support systems, Discharge planning  Education Outcome: Acknowledges understanding/In group clarification offered/Needs additional education.    Affect/Mood: Appropriate   Participation Level: None   Participation Quality: Independent   Behavior: Cooperative   Speech/Thought Process: Relevant   Insight: None   Judgement: None   Modes of Intervention: Activity   Patient Response to Interventions:   Attentive   Education Outcome:  In group clarification offered    Clinical Observations/Individualized Feedback: Pt was attentive in the beginning of group. Pt was called out of group and later returned near the end of group.     Plan: Continue to engage patient in RT group sessions 2-3x/week.   Ocie Tino-McCall, LRT,CTRS 04/10/2024 12:46 PM

## 2024-04-10 NOTE — Progress Notes (Signed)
 Adult Psychoeducational Group Note  Date:  04/10/2024 Time:  9:02 PM  Group Topic/Focus:  Wrap-Up Group:   The focus of this group is to help patients review their daily goal of treatment and discuss progress on daily workbooks.  Participation Level:  Active  Participation Quality:  Appropriate  Affect:  Appropriate  Cognitive:  Appropriate  Insight: Appropriate  Engagement in Group:  Engaged  Modes of Intervention:  Discussion  Additional Comments:    Caye Cocks 04/10/2024, 9:02 PM

## 2024-04-10 NOTE — Progress Notes (Signed)
   04/10/24 0555  15 Minute Checks  Location Bedroom  Visual Appearance Calm  Behavior Sleeping  Sleep (Behavioral Health Patients Only)  Calculate sleep? (Click Yes once per 24 hr at 0600 safety check) Yes  Documented sleep last 24 hours 6

## 2024-04-10 NOTE — Plan of Care (Signed)
   Problem: Activity: Goal: Interest or engagement in activities will improve Outcome: Progressing   Problem: Coping: Goal: Ability to verbalize frustrations and anger appropriately will improve Outcome: Progressing   Problem: Safety: Goal: Periods of time without injury will increase Outcome: Progressing

## 2024-04-11 DIAGNOSIS — F332 Major depressive disorder, recurrent severe without psychotic features: Secondary | ICD-10-CM | POA: Diagnosis not present

## 2024-04-11 MED ORDER — LOPERAMIDE HCL 2 MG PO CAPS
4.0000 mg | ORAL_CAPSULE | ORAL | Status: DC | PRN
  Administered 2024-04-11 – 2024-04-12 (×2): 4 mg via ORAL
  Filled 2024-04-11 (×2): qty 2

## 2024-04-11 NOTE — Group Note (Signed)
 LCSW Group Therapy Note   Group Date: 04/11/2024 Start Time: 1100 End Time: 1200   Participation:  patient was present.  He listened but didn't participate in the conversation.  Type of Therapy:  Group Therapy  Topic:  Finding Balance: Using Wise Mind for Thoughtful Decisions  Objective:  To help participants understand and apply the concept of Agustina Aldrich Mind to make balanced, thoughtful decisions by integrating emotion and logic.  Goals: Learn the differences between Emotional Mind, Reasonable Mind, and [employer]. Recognize personal signs of Emotional and Reasonable Mind. Practice using [employer] in real-life scenarios.  Therapeutic Modalities: Elements of Dialectical Behavior Therapy (DBT):  Mindfulness (noticing thoughts and emotions without judgment), Emotion Regulation (understanding and managing emotional responses), Distress Tolerance (coping with difficult situations without making them worse), Wise Mind (integrating emotion and reason for balanced decision-making) Elements of Cognitive Behavioral Therapy (CBT):  Identifying automatic thoughts, Challenging cognitive distortions, Using logic to reframe unhelpful thinking patterns  Summary:  This class focused on Wise Mind - DBT's concept of balancing Emotional Mind and Reasonable Mind. We identified when we're in each state and practiced using Wise Mind to respond thoughtfully in real-life situations. By combining emotion and logic, participants can improve decision-making, manage challenges, and enhance relationships.   Xavier Owens, LCSWA 04/11/2024  6:17 PM

## 2024-04-11 NOTE — Group Note (Signed)
 Occupational Therapy Group Note  Group Topic: Sleep Hygiene  Group Date: 04/11/2024 Start Time: 1400 End Time: 1430 Facilitators: Lynnda Sas, OT   Group Description: Group encouraged increased participation and engagement through topic focused on sleep hygiene. Patients reflected on the quality of sleep they typically receive and identified areas that need improvement. Group was given background information on sleep and sleep hygiene, including common sleep disorders. Group members also received information on how to improve one's sleep and introduced a sleep diary as a tool that can be utilized to track sleep quality over a length of time. Group session ended with patients identifying one or more strategies they could utilize or implement into their sleep routine in order to improve overall sleep quality.        Therapeutic Goal(s):  Identify one or more strategies to improve overall sleep hygiene  Identify one or more areas of sleep that are negatively impacted (sleep too much, too little, etc)     Participation Level: Engaged   Participation Quality: Independent   Behavior: Appropriate   Speech/Thought Process: Relevant   Affect/Mood: Appropriate   Insight: Fair   Judgement: Fair      Modes of Intervention: Education  Patient Response to Interventions:  Attentive   Plan: Continue to engage patient in OT groups 2 - 3x/week.  04/11/2024  Lynnda Sas, OT   Aylanie Cubillos, OT

## 2024-04-11 NOTE — Plan of Care (Signed)
   Problem: Education: Goal: Emotional status will improve Outcome: Progressing Goal: Mental status will improve Outcome: Progressing Goal: Verbalization of understanding the information provided will improve Outcome: Progressing   Problem: Activity: Goal: Interest or engagement in activities will improve Outcome: Progressing Goal: Sleeping patterns will improve Outcome: Progressing

## 2024-04-11 NOTE — Plan of Care (Signed)
   Problem: Activity: Goal: Interest or engagement in activities will improve Outcome: Progressing   Problem: Coping: Goal: Ability to demonstrate self-control will improve Outcome: Progressing   Problem: Safety: Goal: Periods of time without injury will increase Outcome: Progressing

## 2024-04-11 NOTE — Progress Notes (Signed)
   04/11/24 0600  15 Minute Checks  Location Bedroom  Visual Appearance Calm  Behavior Sleeping  Sleep (Behavioral Health Patients Only)  Calculate sleep? (Click Yes once per 24 hr at 0600 safety check) Yes  Documented sleep last 24 hours 7.75

## 2024-04-11 NOTE — BHH Group Notes (Signed)
 Spirituality Group   Description: Participant directed exploration of values, beliefs and meaning   Following a brief framework of chaplain's role and ground rules of group behavior, participants are invited to share concerns or questions that engage spiritual life. Emphasis placed on common themes and shared experiences and ways to make meaning and clarify living into one's values.   Theory/Process/Goal: Utilize the theoretical framework of group therapy established by Derrell Flight, Relational Cultural Theory and Rogerian approaches to facilitate relational empathy and use of the "here and now" to foster reflection, self-awareness, and sharing.   Observations: Xavier Owens was an active participant in the group discussion.  Pattie Flaharty L. Minetta Aly, M.Div [phone]

## 2024-04-11 NOTE — Group Note (Signed)
 Date:  04/11/2024 Time:  9:34 PM  Group Topic/Focus:  Wrap-Up Group:   The focus of this group is to help patients review their daily goal of treatment and discuss progress on daily workbooks.    Participation Level:  None  Participation Quality:  Inattentive  Affect:  Blunted  Cognitive:  Alert  Insight: None  Engagement in Group:  None  Modes of Intervention:  Discussion and Education  Additional Comments:   Pt attended wrap up group this evening, but opted out of sharing their day. Pt has no complaints at this time.   Eligah Grow 04/11/2024, 9:34 PM

## 2024-04-11 NOTE — Progress Notes (Signed)
   04/11/24 2300  Psych Admission Type (Psych Patients Only)  Admission Status Voluntary  Psychosocial Assessment  Patient Complaints None  Eye Contact Fair  Facial Expression Animated  Affect Appropriate to circumstance  Speech Logical/coherent  Interaction Assertive  Motor Activity Slow  Appearance/Hygiene Unremarkable  Behavior Characteristics Cooperative  Mood Pleasant  Thought Process  Coherency WDL  Content WDL  Delusions None reported or observed  Perception WDL  Hallucination None reported or observed  Judgment WDL  Confusion None  Danger to Self  Current suicidal ideation? Denies  Agreement Not to Harm Self Yes  Description of Agreement verbal  Danger to Others  Danger to Others None reported or observed

## 2024-04-11 NOTE — Progress Notes (Signed)
 Patient ID: Xavier Owens, male   DOB: [birth year], 53 y.o.   MRN: [id number] [facility] MD Progress Note  04/11/2024 3:21 PM Kiernan Atkerson  MRN:  [id number] Subjective:   53 year old African-American male, divorced, self-employed, lives alone. Extensive history of alcohol use disorder and multiple medical comorbidities related to alcohol use. Average BAL at previous presentations in the high 400s. History of withdrawal seizures in the past. Presented this time with BAL 257. Current presentation is on account of worsening depression associated with suicidal thoughts. He had not been fully adherent on his medications.  Chart reviewed today.  Patient discussed at multidisciplinary team meeting.   Nursing staff reports that he slept for 6 hours.  No challenging behavior.  He is has been detoxing appropriately from alcohol.  He has been adherent with his prescribed medications.  Today's assessment notes: On assessment today, the pt reports that his mood is euthymic, improved since admission, and stable. Denies feeling down, depressed, or sad.  Requesting Imodium  for complaint of diarrhea.  No seizures activities. Denies delusional preoccupation or paranoia.  Denies SI,, HI, or AVH Reports that anxiety symptoms are at manageable level.  Sleep is stable. Appetite is stable.  Concentration is without complaint.  Energy level is adequate. Denies having any suicidal thoughts. Denies having any suicidal intent and plan.  Denies having any HI.  Denies having psychotic symptoms.   Denies having side effects to current psychiatric medications.   Discussed discharge planning :How to identify the signs of impending crisis, use of internal coping strategies, reaching out to friends and family that can help navigate a crisis, and a list of mental health professionals and agencies to call. Further to follow up on her mental health appointments and her PCP appointments.    Principal Problem: MDD (major depressive  disorder) Diagnosis: Principal Problem:   MDD (major depressive disorder)  Total Time spent with patient: 45 minutes  Past Psychiatric History:  See H&P  Past Medical History:  Past Medical History:  Diagnosis Date   Alcoholic (HCC)    Hypertension    Seizures (HCC)    Tremor     Past Surgical History:  Procedure Laterality Date   dislocated hip     HAND SURGERY     KNEE SURGERY     Family History:  Family History  Problem Relation Age of Onset   Cancer Mother    Cancer Father    Family Psychiatric  History:  See H&P  Social History:  Social History   Substance and Sexual Activity  Alcohol Use Yes   Comment: "a fifth of vodka every day for 30 years"     Social History   Substance and Sexual Activity  Drug Use Yes   Types: Marijuana    Social History   Socioeconomic History   Marital status: Married    Spouse name: Not on file   Number of children: Not on file   Years of education: Not on file   Highest education level: Not on file  Occupational History   Not on file  Tobacco Use   Smoking status: Every Day    Current packs/day: 0.25    Types: Cigarettes   Smokeless tobacco: Never  Vaping Use   Vaping status: Never Used  Substance and Sexual Activity   Alcohol use: Yes    Comment: "a fifth of vodka every day for 30 years"   Drug use: Yes    Types: Marijuana   Sexual activity: Yes  Other Topics Concern  Not on file  Social History Narrative   ** Merged History Encounter **       Social Drivers of Health   Financial Resource Strain: Not on file  Food Insecurity: No Food Insecurity (04/03/2024)   Hunger Vital Sign    Worried About Running Out of Food in the Last Year: Never true    Ran Out of Food in the Last Year: Never true  Transportation Needs: No Transportation Needs (04/03/2024)   PRAPARE - Administrator, Civil Service (Medical): No    Lack of Transportation (Non-Medical): No  Physical Activity: Not on file  Stress:  Not on file  Social Connections: Socially Integrated (01/24/2024)   Social Connection and Isolation Panel [NHANES]    Frequency of Communication with Friends and Family: More than three times a week    Frequency of Social Gatherings with Friends and Family: More than three times a week    Attends Religious Services: More than 4 times per year    Active Member of [employer] or Organizations: Yes    Attends Engineer, structural: More than 4 times per year    Marital Status: Living with partner   Additional Social History:   Current Medications: Current Facility-Administered Medications  Medication Dose Route Frequency Provider Last Rate Last Admin   acetaminophen  (TYLENOL ) tablet 650 mg  650 mg Oral Q6H PRN Weber, Kyra A, NP   650 mg at 04/08/24 0747   alum & mag hydroxide-simeth (MAALOX/MYLANTA) 200-200-20 MG/5ML suspension 30 mL  30 mL Oral Q4H PRN Weber, Kyra A, NP       Cholecalciferol  TABS 5,000 Units  1 tablet Oral Daily Weber, Kyra A, NP   5,000 Units at 04/11/24 1343   diclofenac  Sodium (VOLTAREN ) 1 % topical gel 2 g  2 g Topical Daily PRN Maleyah Evans C, FNP       haloperidol  (HALDOL ) tablet 5 mg  5 mg Oral TID PRN Weber, Kyra A, NP       And   diphenhydrAMINE  (BENADRYL ) capsule 50 mg  50 mg Oral TID PRN Weber, Kyra A, NP       haloperidol  lactate (HALDOL ) injection 5 mg  5 mg Intramuscular TID PRN Weber, Kyra A, NP       And   diphenhydrAMINE  (BENADRYL ) injection 50 mg  50 mg Intramuscular TID PRN Weber, Kyra A, NP       And   LORazepam  (ATIVAN ) injection 2 mg  2 mg Intramuscular TID PRN Weber, Kyra A, NP       haloperidol  lactate (HALDOL ) injection 10 mg  10 mg Intramuscular TID PRN Weber, Kyra A, NP       And   diphenhydrAMINE  (BENADRYL ) injection 50 mg  50 mg Intramuscular TID PRN Weber, Kyra A, NP       And   LORazepam  (ATIVAN ) injection 2 mg  2 mg Intramuscular TID PRN Weber, Kyra A, NP       DULoxetine  (CYMBALTA ) DR capsule 60 mg  60 mg Oral Daily Weber, Kyra A, NP    60 mg at 04/11/24 0802   ferrous sulfate  tablet 325 mg  325 mg Oral QHS Weber, Kyra A, NP   325 mg at 04/10/24 2059   gabapentin  (NEURONTIN ) capsule 600 mg  600 mg Oral TID Weber, Kyra A, NP   600 mg at 04/11/24 1214   hydrOXYzine  (ATARAX ) tablet 25 mg  25 mg Oral Q6H PRN Weber, Kyra A, NP   25 mg at  04/11/24 0802   loperamide  (IMODIUM ) capsule 4 mg  4 mg Oral PRN Taleigha Pinson C, FNP   4 mg at 04/11/24 1214   magnesium  hydroxide (MILK OF MAGNESIA) suspension 30 mL  30 mL Oral Daily PRN Weber, Kyra A, NP       multivitamin with minerals tablet 1 tablet  1 tablet Oral Daily Izediuno, Iline Mallory, MD   1 tablet at 04/11/24 0802   naltrexone  (DEPADE) tablet 50 mg  50 mg Oral Daily Weber, Kyra A, NP   50 mg at 04/11/24 0802   pantoprazole  (PROTONIX ) EC tablet 40 mg  40 mg Oral Daily Weber, Kyra A, NP   40 mg at 04/11/24 0802   propranolol  (INDERAL ) tablet 10 mg  10 mg Oral BID Weber, Kyra A, NP   10 mg at 04/11/24 4098   QUEtiapine  (SEROQUEL ) tablet 100 mg  100 mg Oral QHS Weber, Kyra A, NP   100 mg at 04/10/24 2059   thiamine  (Vitamin B-1) tablet 100 mg  100 mg Oral Daily Zouev, Dmitri, MD   100 mg at 04/11/24 0802   thiamine  (VITAMIN B1) injection 100 mg  100 mg Intramuscular Once Izediuno, Iline Mallory, MD       traZODone  (DESYREL ) tablet 100 mg  100 mg Oral QHS Weber, Kyra A, NP   100 mg at 04/10/24 2059   Lab Results: No results found for this or any previous visit (from the past 48 hours).  Blood Alcohol level:  Lab Results  Component Value Date   ETH 257 (H) 04/02/2024   ETH <15 03/16/2024   Metabolic Disorder Labs: Lab Results  Component Value Date   HGBA1C 4.5 (L) 01/29/2024   MPG 82.45 01/29/2024   MPG 97 12/09/2021   No results found for: "PROLACTIN" Lab Results  Component Value Date   CHOL 169 04/04/2024   TRIG 195 (H) 04/04/2024   HDL 56 04/04/2024   CHOLHDL 3.0 04/04/2024   VLDL 39 04/04/2024   LDLCALC 74 04/04/2024   LDLCALC 91 01/29/2024   Physical Findings: AIMS:  , ,   ,  ,    CIWA:  CIWA-Ar Total: 0 COWS:     Musculoskeletal: Strength & Muscle Tone: within normal limits Gait & Station: Broad-based Patient leans: N/A  Psychiatric Specialty Exam:  Presentation  General Appearance:  Casually dressed, in bed prior to interview, not in any distress, appropriate behavior, engaged politely.  No tremors.  No confusion.  Eye Contact: Good.  Normal conjugate eye movement.  Speech: Spontaneous.  Soft spoken.  Mood and Affect  Mood: Worried but not pervasively down.  Affect: Restricted and appropriate.  Thought Process  Thought Processes: Linear and goal directed.  Descriptions of Associations:Intact  Orientation:Full (Time, Place and Person)  Thought Content: Negative ruminations.  Guilty ruminations.  No current suicidal thoughts.  Future-oriented.  No homicidal thoughts.  No thoughts of violence. No delusional theme.  No obsessions.  Hallucinations: No hallucination in any modality.  Sensorium  Memory: Good.  Judgment: Good.  Insight: Good  Executive Functions  Concentration: Good.  Attention Span: Good.  Recall: Good.  Fund of Knowledge: Good.  Language: Good  Psychomotor Activity  Normal psychomotor activity  Physical Exam: Physical Exam Vitals and nursing note reviewed.  Constitutional:      General: He is not in acute distress.    Appearance: He is not ill-appearing.  HENT:     Head: Normocephalic.     Right Ear: External ear normal.  Left Ear: External ear normal.     Nose: Nose normal.     Mouth/Throat:     Mouth: Mucous membranes are moist.     Pharynx: Oropharynx is clear.  Eyes:     Extraocular Movements: Extraocular movements intact.  Cardiovascular:     Rate and Rhythm: Normal rate.     Pulses: Normal pulses.  Pulmonary:     Effort: Pulmonary effort is normal.  Abdominal:     Comments: Deferred  Genitourinary:    Comments: Deferred Musculoskeletal:        General: Normal range of  motion.     Cervical back: Normal range of motion.  Skin:    General: Skin is warm.  Neurological:     General: No focal deficit present.     Mental Status: He is alert and oriented to person, place, and time.  Psychiatric:        Mood and Affect: Mood normal.        Behavior: Behavior normal.        Thought Content: Thought content normal.    Review of Systems  Constitutional:  Negative for chills and fever.  HENT:  Negative for sore throat.   Eyes:  Negative for blurred vision.  Respiratory:  Negative for cough, sputum production, shortness of breath and wheezing.   Cardiovascular:  Negative for chest pain and palpitations.  Gastrointestinal:  Negative for heartburn and nausea.  Genitourinary:  Negative for dysuria and urgency.  Musculoskeletal:  Negative for falls.  Skin:  Negative for itching and rash.  Neurological:  Negative for dizziness and headaches.  Endo/Heme/Allergies:        See allergy listing  Psychiatric/Behavioral:  Positive for depression. Negative for hallucinations, substance abuse and suicidal ideas. The patient is nervous/anxious. The patient does not have insomnia.    Blood pressure 104/70, pulse 85, temperature 97.8 F (36.6 C), temperature source Oral, resp. rate 17, height 5\' 8"  (1.727 m), weight 73.5 kg, SpO2 99%. Body mass index is 24.63 kg/m.  Treatment Plan Summary: Patient is gradually coming off alcohol.  No complications so far.  He is tolerating his medications well.  No current dangerousness.  We will keep his medications the same.  Hopeful discharge by mid week if he maintains stability.  1.  Continue duloxetine  60 mg daily. 2.  Continue quetiapine  100 mg at bedtime. 3.  Continue gabapentin  600 mg 3 times daily. 4.  Continue naltrexone  50 mg daily. 5.  Continue to monitor mood behavior and interaction with others. 6.  Continue to encourage unit groups and therapeutic activities. 7.  Social worker will coordinate discharge and aftercare  planning.   Laurence Pons, FNP 04/11/2024, 3:21 PM Patient ID: [name], male   DOB: 10/15/1971, 53 y.o.   MRN: [id number] Patient ID: Zyad Boomer, male   DOB: 1971/09/20, 53 y.o.   MRN: [id number] Patient ID: Vonte Rossin, male   DOB: Dec 21, 1970, 53 y.o.   MRN: [id number]

## 2024-04-11 NOTE — Group Note (Signed)
 Date:  04/11/2024 Time:  9:15 AM  Group Topic/Focus:  Goals Group:   The focus of this group is to help patients establish daily goals to achieve during treatment and discuss how the patient can incorporate goal setting into their daily lives to aide in recovery.    Participation Level:  Active  Participation Quality:  Appropriate  Affect:  Appropriate   Xavier Owens 04/11/2024, 9:15 AM

## 2024-04-12 DIAGNOSIS — F332 Major depressive disorder, recurrent severe without psychotic features: Secondary | ICD-10-CM | POA: Diagnosis not present

## 2024-04-12 MED ORDER — FOLIC ACID 1 MG PO TABS: 1.0000 mg | ORAL_TABLET | Freq: Every day | ORAL | 0 refills | Status: DC

## 2024-04-12 MED ORDER — HYDROXYZINE HCL 25 MG PO TABS: 25.0000 mg | ORAL_TABLET | Freq: Four times a day (QID) | ORAL | 0 refills | Status: DC | PRN

## 2024-04-12 MED ORDER — PROPRANOLOL HCL 10 MG PO TABS: 10.0000 mg | ORAL_TABLET | Freq: Two times a day (BID) | ORAL | Status: DC

## 2024-04-12 MED ORDER — DICLOFENAC SODIUM 1 % EX GEL: 2.0000 g | Freq: Four times a day (QID) | CUTANEOUS | Status: DC

## 2024-04-12 MED ORDER — TRAZODONE HCL 100 MG PO TABS: 100.0000 mg | ORAL_TABLET | Freq: Every day | ORAL | 0 refills | Status: DC

## 2024-04-12 MED ORDER — DULOXETINE HCL 60 MG PO CPEP: 60.0000 mg | ORAL_CAPSULE | Freq: Every day | ORAL | 0 refills | Status: DC

## 2024-04-12 MED ORDER — PROPRANOLOL HCL 10 MG PO TABS: 10.0000 mg | ORAL_TABLET | Freq: Two times a day (BID) | ORAL | 0 refills | Status: DC

## 2024-04-12 MED ORDER — NALTREXONE HCL 50 MG PO TABS
50.0000 mg | ORAL_TABLET | Freq: Every day | ORAL | 0 refills | Status: DC
Start: 2024-04-12 — End: 2024-04-22

## 2024-04-12 MED ORDER — PANTOPRAZOLE SODIUM 40 MG PO TBEC: 40.0000 mg | DELAYED_RELEASE_TABLET | Freq: Every day | ORAL | 0 refills | Status: DC

## 2024-04-12 MED ORDER — FERROUS SULFATE 325 (65 FE) MG PO TABS: 325.0000 mg | ORAL_TABLET | Freq: Every day | ORAL | 0 refills | Status: DC

## 2024-04-12 MED ORDER — QUETIAPINE FUMARATE 100 MG PO TABS: 100.0000 mg | ORAL_TABLET | Freq: Every day | ORAL | 0 refills | Status: DC

## 2024-04-12 NOTE — BHH Suicide Risk Assessment (Signed)
 [facility] Discharge Suicide Risk Assessment   Principal Problem: MDD (major depressive disorder)  Discharge Diagnoses: Principal Problem:   MDD (major depressive disorder)      Total Time spent with patient: 40  Musculoskeletal: Strength & Muscle Tone: within normal limits Gait & Station: normal Patient leans: N/A   Psychiatric Specialty Exam:  Presentation  General Appearance: Appropriate for Environment  Eye Contact: Good  Speech: Clear and Coherent; Normal Rate  Speech Volume: Normal  Handedness: Right   Mood and Affect  Mood: Euthymic  Affect: Congruent   Thought Process  Thought Processes: Linear  Descriptions of Associations: Intact  Orientation: Full (Time, Place and Person)  Thought Content: Logical  History of Schizophrenia/Schizoaffective disorder: No  Duration of Psychotic Symptoms: NA Hallucinations: Hallucinations: None  Ideas of Reference: None  Suicidal Thoughts: Suicidal Thoughts: No  Homicidal Thoughts: Homicidal Thoughts: No   Sensorium  Memory: Immediate Good  Judgment: Fair  Insight: Fair   Art therapist  Concentration: Good  Attention Span: Good  Recall: Good  Fund of Knowledge: Good  Language: Good   Psychomotor Activity  Psychomotor Activity: Psychomotor Activity: Normal   Assets  Assets: Communication Skills; Social Support; Housing; Leisure Time   Sleep  Sleep: Sleep: Good Number of Hours of Sleep: 7.75   Physical Exam: General: Sitting comfortably. NAD. HEENT: Normocephalic, atraumatic, MMM, EMOI Lungs: no increased work of breathing noted Heart: no cyanosis Abdomen: Non distended Musculoskeletal: FROM. No obvious deformities Skin: Warm, dry, intact. No rashes noted Neuro: No obvious focal deficits.  Gait and station are normal  Review of Systems  Constitutional: Negative.   HENT: Negative.    Eyes: Negative.   Respiratory: Negative.    Cardiovascular: Negative.   Gastrointestinal:  Negative.   Genitourinary: Negative.   Skin: Negative.   Neurological: Negative.   Psychiatric/Behavioral:  Negative   Mental Status Per Nursing Assessment: Suicidal ideation indicated by patient, Self-harm thoughts (Prior to admission to [facility])  Demographic Factors:  Male  Loss Factors: Decline in physical health  Historical Factors: Family history of mental illness or substance abuse  Risk Reduction Factors:   Sense of responsibility to family, Living with another person, especially a relative, and Positive social support  Continued Clinical Symptoms:  Previous psychiatric diagnoses and treatments  Cognitive Features That Contribute To Risk:  None  Suicide Risk:  Minimal: No identifiable suicidal ideation.  Patients presenting with no risk factors but with morbid ruminations; may be classified as minimal risk based on the severity of the depressive symptoms.    Follow-up Information     [facility] Go on 04/19/2024.   Specialty: Professional Counselor Why: Please go to this provider on 04/19/24 at 9:00 am for an assessment, if you wish to receiive therapy services. You may also go Monday through Friday, from 9 am to 1 pm. Contact information: [address]  [address] [place] [address] [phone]         [facility] Follow up on 04/22/2024.   Specialty: Behavioral Health Why: You have an appointment for medication management services on 04/22/24 at 2:00 pm. Contact information: [address] [place]  [address] [phone]                 Plan Of Care/Follow-up recommendations:  Activity: as tolerated  Diet: heart healthy  Other: -Follow-up with your outpatient psychiatric provider -instructions on appointment date, time, and address (location) are provided to you in discharge paperwork.  -Take your psychiatric medications  as prescribed at discharge - instructions are provided to you in  the discharge paperwork  -Follow-up with outpatient primary care doctor and other specialists -for management of preventative medicine and chronic medical issues  -Testing: Follow-up with outpatient provider for any abnormal lab results (if any)  -If you are prescribed an atypical antipsychotic medication, we recommend that your outpatient psychiatrist follow routine screening for side effects within 3 months of discharge, including monitoring: AIMS scale, height, weight, blood pressure, fasting lipid panel, HbA1c, and fasting blood sugar.   -Recommend total abstinence from alcohol, tobacco, and other illicit drug use at discharge.   -If your psychiatric symptoms recur, worsen, or if you have side effects to your psychiatric medications, call your outpatient psychiatric provider, [phone] or go to the nearest emergency department.  -If suicidal thoughts occur, immediately call your outpatient psychiatric provider, 911, [phone] or go to the nearest emergency department.   Clair Crews, MD 04/12/24 12:06 PM

## 2024-04-12 NOTE — Group Note (Signed)
 Date:  04/12/2024 Time:  9:10 AM  Group Topic/Focus:  Goals Group:   The focus of this group is to help patients establish daily goals to achieve during treatment and discuss how the patient can incorporate goal setting into their daily lives to aide in recovery.    Participation Level:  Did Not Attend   Xavier Owens 04/12/2024, 9:10 AM

## 2024-04-12 NOTE — Plan of Care (Addendum)
 D: Pt A & O X 4. Denies SI, HI, AVH and pain at this time. D/C home as ordered. Taxi voucher given for transportation home.  A: D/C instructions reviewed with pt including prescriptions, medication samples and follow up appointment; compliance encouraged.  Pt's care plan completed for d/c.  All belongings from assigned locker returned to pt at time of departure. Scheduled and PRN medications given with verbal education and effects monitored. Safety checks maintained without incident till time of d/c.  R: Pt receptive to care. Compliant with medications when offered. Denies adverse drug reactions when assessed. Verbalized understanding related to d/c instructions. Signed belonging sheet in agreement with items received from locker. Ambulatory with a steady gait. Appears to be in no physical distress at time of departure.

## 2024-04-12 NOTE — Progress Notes (Signed)
   04/12/24 0610  15 Minute Checks  Location Bedroom  Visual Appearance Calm  Behavior Sleeping  Sleep (Behavioral Health Patients Only)  Calculate sleep? (Click Yes once per 24 hr at 0600 safety check) Yes  Documented sleep last 24 hours 8

## 2024-04-12 NOTE — Transportation (Signed)
 04/12/2024  Xavier Owens DOB: 10/13/1971 MRN: [id number]   RIDER WAIVER AND RELEASE OF LIABILITY  For the purposes of helping with transportation needs, [facility] partners with outside transportation providers (taxi companies, [place], Catering manager.) to give [facility] patients or other approved people the choice of on-demand rides [employer]") to our buildings for non-emergency visits.  By using [employer], I, the person signing this document, on behalf of myself and/or any legal minors (in my care using the [employer]), agree:  Science writer given to me are supplied by independent, outside transportation providers who do not work for, or have any affiliation with, [employer]. [facility] is not a transportation company. [facility] has no control over the quality or safety of the rides I get using [employer]. [facility] has no control over whether any outside ride will happen on time or not. [facility] gives no guarantee on the reliability, quality, safety, or availability on any rides, or that no mistakes will happen. I know and accept that traveling by vehicle (car, truck, [employer], [employer], bus, taxi, etc.) has risks of serious injuries such as disability, being paralyzed, and death. I know and agree the risk of using [employer] is mine alone, and not [employer]. Transport Services are provided "as is" and as are available. The transportation providers are in charge for all inspections and care of the vehicles used to provide these rides. I agree not to take legal action against [facility], its agents, employees, officers, directors, representatives, insurers, attorneys, assigns, successors, subsidiaries, and affiliates at any time for any reasons related directly or indirectly to using [employer]. I also agree not to take legal action against  or its affiliates for any injury, death, or damage to property caused by or related to using  [employer]. I have read this Waiver and Release of Liability, and I understand the terms used in it and their legal meaning. This Waiver is freely and voluntarily given with the understanding that my right (or any legal minors) to legal action against  relating to [employer] is knowingly given up to use these services.   I attest that I read the Ride Waiver and Release of Liability to Xavier Owens, gave Mr. Stuck the opportunity to ask questions and answered the questions asked (if any). I affirm that Xavier Owens then provided consent for assistance with transportation.

## 2024-04-12 NOTE — Progress Notes (Signed)
  [facility] Adult Case Management Discharge Plan :  Will you be returning to the same living situation after discharge:  No. At discharge, do you have transportation home?: Yes,  CSW arranged [employer] taxi for 1230pm Do you have the ability to pay for your medications: Yes,  pt has active health insurance coverage  Release of information consent forms completed and in the chart;  Patient's signature needed at discharge.  Patient to Follow up at:  Follow-up Information     [facility] Go on 04/19/2024.   Specialty: Professional Counselor Why: Please go to this provider on 04/19/24 at 9:00 am for an assessment, if you wish to receiive therapy services. You may also go Monday through Friday, from 9 am to 1 pm. Contact information: [address]  [address] [place] [address] [phone]         [facility] Follow up on 04/22/2024.   Specialty: Behavioral Health Why: You have an appointment for medication management services on 04/22/24 at 2:00 pm. Contact information: [address] [place]  [address] [phone]                Next level of care provider has access to [facility] Link:no  Safety Planning and Suicide Prevention discussed: Xavier Owens [phone]     Has patient been referred to the Quitline?: Patient refused referral for treatment  Patient has been referred for addiction treatment: Patient refused referral for treatment.  Vonzell Guerin, LCSWA 04/12/2024, 9:37 AM

## 2024-04-12 NOTE — Plan of Care (Signed)
 Problem: Education: Goal: Knowledge of [facility] Education information/materials will improve Outcome: Progressing Goal: Emotional status will improve Outcome: Progressing Goal: Mental status will improve Outcome: Progressing Goal: Verbalization of understanding the information provided will improve Outcome: Progressing   Problem: Activity: Goal: Interest or engagement in activities will improve Outcome: Progressing Goal: Sleeping patterns will improve Outcome: Progressing   Problem: Coping: Goal: Ability to verbalize frustrations and anger appropriately will improve Outcome: Progressing Goal: Ability to demonstrate self-control will improve Outcome: Progressing   Problem: Health Behavior/Discharge Planning: Goal: Identification of resources available to assist in meeting health care needs will improve Outcome: Progressing Goal: Compliance with treatment plan for underlying cause of condition will improve Outcome: Progressing   Problem: Physical Regulation: Goal: Ability to maintain clinical measurements within normal limits will improve Outcome: Progressing   Problem: Safety: Goal: Periods of time without injury will increase Outcome: Progressing   Problem: Activity: Goal: Will identify at least one activity in which they can participate Outcome: Progressing   Problem: Coping: Goal: Ability to identify and develop effective coping behavior will improve Outcome: Progressing Goal: Ability to interact with others will improve Outcome: Progressing Goal: Demonstration of participation in decision-making regarding own care will improve Outcome: Progressing Goal: Ability to use eye contact when communicating with others will improve Outcome: Progressing   Problem: Health Behavior/Discharge Planning: Goal: Identification of resources available to assist in meeting health care needs will improve Outcome: Progressing   Problem: Self-Concept: Goal: Will  verbalize positive feelings about self Outcome: Progressing   Problem: Education: Goal: Ability to make informed decisions regarding treatment will improve Outcome: Progressing   Problem: Coping: Goal: Coping ability will improve Outcome: Progressing   Problem: Health Behavior/Discharge Planning: Goal: Identification of resources available to assist in meeting health care needs will improve Outcome: Progressing   Problem: Medication: Goal: Compliance with prescribed medication regimen will improve Outcome: Progressing   Problem: Self-Concept: Goal: Ability to disclose and discuss suicidal ideas will improve Outcome: Progressing Goal: Will verbalize positive feelings about self Outcome: Progressing Note: Patient is on track. Patient will maintain adherence    Problem: Education: Goal: Knowledge of [facility] Education information/materials will improve Outcome: Progressing Goal: Emotional status will improve Outcome: Progressing Goal: Mental status will improve Outcome: Progressing Goal: Verbalization of understanding the information provided will improve Outcome: Progressing   Problem: Activity: Goal: Interest or engagement in activities will improve Outcome: Progressing Goal: Sleeping patterns will improve Outcome: Progressing   Problem: Coping: Goal: Ability to verbalize frustrations and anger appropriately will improve Outcome: Progressing Goal: Ability to demonstrate self-control will improve Outcome: Progressing   Problem: Health Behavior/Discharge Planning: Goal: Identification of resources available to assist in meeting health care needs will improve Outcome: Progressing Goal: Compliance with treatment plan for underlying cause of condition will improve Outcome: Progressing   Problem: Physical Regulation: Goal: Ability to maintain clinical measurements within normal limits will improve Outcome: Progressing   Problem: Safety: Goal: Periods of  time without injury will increase Outcome: Progressing   Problem: Education: Goal: Knowledge of disease or condition will improve Outcome: Progressing Goal: Understanding of discharge needs will improve Outcome: Progressing   Problem: Health Behavior/Discharge Planning: Goal: Ability to identify changes in lifestyle to reduce recurrence of condition will improve Outcome: Progressing Goal: Identification of resources available to assist in meeting health care needs will improve Outcome: Progressing   Problem: Physical Regulation: Goal: Complications related to the disease process, condition or treatment will be avoided or minimized Outcome: Progressing  Problem: Safety: Goal: Ability to remain free from injury will improve Outcome: Progressing

## 2024-04-12 NOTE — Discharge Summary (Signed)
 Physician Discharge Summary Note  Patient:  Xavier Owens is a 53 y.o. male  MRN:  [id number]  DOB:  1971/04/16  Patient phone: [phone] (home)  Patient address:   [address] [place] [address]   Total Time spent with patient: 30 Minutes  Date of Admission:  04/03/2024  Date of Discharge: 04/12/24   Reason for Admission: Suicidal ideation  Principal Problem: MDD (major depressive disorder)  Discharge Diagnoses: Principal Problem:   MDD (major depressive disorder)     Past Psychiatric (and medical) History: Jance Siek  has a past medical history of Alcoholic (HCC), Hypertension, Seizures (HCC), and Tremor.   Past Medical History:  Past Medical History:  Diagnosis Date   Alcoholic (HCC)    Hypertension    Seizures (HCC)    Tremor      Past Surgical History:  Procedure Laterality Date   dislocated hip     HAND SURGERY     KNEE SURGERY       Family History:  Family History  Problem Relation Age of Onset   Cancer Mother    Cancer Father      Family Psychiatric  History: See H&P  Social History:  Social History   Substance and Sexual Activity  Alcohol Use Yes   Comment: "a fifth of vodka every day for 30 years"     Social History   Substance and Sexual Activity  Drug Use Yes   Types: Marijuana     Social History   Socioeconomic History   Marital status: Married    Spouse name: Not on file   Number of children: Not on file   Years of education: Not on file   Highest education level: Not on file  Occupational History   Not on file  Tobacco Use   Smoking status: Every Day    Current packs/day: 0.25    Types: Cigarettes   Smokeless tobacco: Never  Vaping Use   Vaping status: Never Used  Substance and Sexual Activity   Alcohol use: Yes    Comment: "a fifth of vodka every day for 30 years"   Drug use: Yes    Types: Marijuana   Sexual activity: Yes  Other Topics Concern   Not on file  Social History Narrative   ** Merged  History Encounter **       Social Drivers of Health   Financial Resource Strain: Not on file  Food Insecurity: No Food Insecurity (04/03/2024)   Hunger Vital Sign    Worried About Running Out of Food in the Last Year: Never true    Ran Out of Food in the Last Year: Never true  Transportation Needs: No Transportation Needs (04/03/2024)   PRAPARE - Administrator, Civil Service (Medical): No    Lack of Transportation (Non-Medical): No  Physical Activity: Not on file  Stress: Not on file  Social Connections: Socially Integrated (01/24/2024)   Social Connection and Isolation Panel [NHANES]    Frequency of Communication with Friends and Family: More than three times a week    Frequency of Social Gatherings with Friends and Family: More than three times a week    Attends Religious Services: More than 4 times per year    Active Member of [employer] or Organizations: Yes    Attends Engineer, structural: More than 4 times per year    Marital Status: Living with partner     Hospital Course:  During the patient's hospitalization, patient had extensive  initial psychiatric evaluation, and follow-up psychiatric evaluations every day.  Psychiatric diagnoses provided upon initial assessment: MDD (major depressive disorder) [F32.9]   Patient was restarted on medications previously prescribed at last admission.  Patient's care was discussed during the interdisciplinary team meeting every day during the hospitalization.  The patient denied having side effects to prescribed psychiatric medication.  Gradually, patient started adjusting to milieu. The patient was evaluated each day by a clinical provider to ascertain response to treatment. Improvement was noted by the patient's report of decreasing symptoms, improved sleep and appetite, affect, medication tolerance, behavior, and participation in unit programming.  Patient was asked each day to complete a self inventory noting mood, mental  status, pain, new symptoms, anxiety and concerns.    Symptoms were reported as significantly decreased or resolved completely by discharge.   On day of discharge, the patient reports that their mood is stable. The patient denied having suicidal thoughts for more than 48 hours prior to discharge.  Patient denies having homicidal thoughts.  Patient denies having auditory hallucinations.  Patient denies any visual hallucinations or other symptoms of psychosis. The patient was motivated to continue taking medication with a goal of continued improvement in mental health.   The patient reports their target psychiatric symptoms of depression and suicidal ideation responded well to the psychiatric medications and unit programming.  The patient reports overall benefit other psychiatric hospitalization. Supportive psychotherapy was provided to the patient. The patient also participated in regular group therapy while hospitalized. Coping skills, problem solving as well as relaxation therapies were also part of the unit programming.  Labs were reviewed with the patient, and abnormal results were discussed with the patient.  The patient is able to verbalize their individual safety plan to this provider.    Physical Findings:  AIMS:  Facial and Oral Movements: None Muscles of Facial Expression: None Lips and Perioral Area: None Jaw: None Tongue: None,Extremity Movements Upper (arms, wrists, hands, fingers): None Lower (legs, knees, ankles, toes): None, Trunk Movements Neck, shoulders, hips: None, Global Judgements Severity of abnormal movements overall: None Incapacitation due to abnormal movements: None Patient's awareness of abnormal movements: No Awareness, Dental Status Current problems with teeth and/or dentures: No Does patient usually wear dentures: No Edentia: No   CIWA:   NA  COWS:  NA  Musculoskeletal: Strength & Muscle Tone: within normal limits Gait & Station: normal Patient  leans: N/A    Psychiatric Specialty Exam:  Presentation  General Appearance: Appropriate for Environment  Eye Contact: Good  Speech: Clear and Coherent; Normal Rate  Speech Volume: Normal  Handedness: Right   Mood and Affect  Mood: Euthymic  Affect: Congruent   Thought Process  Thought Processes: Linear  Descriptions of Associations: Intact  Orientation: Full (Time, Place and Person)  Thought Content: Logical  History of Schizophrenia/Schizoaffective disorder: No  Duration of Psychotic Symptoms: NA Hallucinations: Hallucinations: None  Ideas of Reference: None  Suicidal Thoughts: Suicidal Thoughts: No  Homicidal Thoughts: Homicidal Thoughts: No   Sensorium  Memory: Immediate Good  Judgment: Fair  Insight: Fair   Art therapist  Concentration: Good  Attention Span: Good  Recall: Good  Fund of Knowledge: Good  Language: Good   Psychomotor Activity  Psychomotor Activity: Psychomotor Activity: Normal   Assets  Assets: Communication Skills; Social Support; Housing; Leisure Time   Sleep  Sleep: Sleep: Good Number of Hours of Sleep: 7.75      Physical Exam: General: Sitting comfortably. NAD. HEENT: Normocephalic, atraumatic, MMM, EMOI Lungs: no  increased work of breathing noted Heart: no cyanosis Abdomen: Non distended Musculoskeletal: FROM. No obvious deformities Skin: Warm, dry, intact. No rashes noted Neuro: No obvious focal deficits.  Gait and station are normal  Review of Systems:  Constitutional: Negative.   HENT: Negative.    Eyes: Negative.   Respiratory: Negative.    Cardiovascular: Negative.   Gastrointestinal: Negative.   Genitourinary: Negative.   Skin: Negative.   Neurological: Negative.   Psychiatric/Behavioral:  Negative  Blood pressure 103/75, pulse 93, temperature 98.4 F (36.9 C), temperature source Oral, resp. rate 16, height 5\' 8"  (1.727 m), weight 73.5 kg, SpO2 97%. Body mass index is 24.63  kg/m.    Social History   Tobacco Use  Smoking Status Every Day   Current packs/day: 0.25   Types: Cigarettes  Smokeless Tobacco Never     Tobacco Cessation:  A prescription for an FDA approved medication for tobacco cessation was not prescribed because: Patient Refused   Blood Alcohol level:  Lab Results  Component Value Date   ETH 257 (H) 04/02/2024   ETH <15 03/16/2024    Metabolic Disorder Labs:  Lab Results  Component Value Date   HGBA1C 4.5 (L) 01/29/2024   MPG 82.45 01/29/2024   MPG 97 12/09/2021   No results found for: "PROLACTIN"  Lab Results  Component Value Date   CHOL 169 04/04/2024   TRIG 195 (H) 04/04/2024   HDL 56 04/04/2024   VLDL 39 04/04/2024   LDLCALC 74 04/04/2024   LDLCALC 91 01/29/2024      See Psychiatric Specialty Exam and Suicide Risk Assessment completed by Attending Physician prior to discharge.  Discharge destination: Home  Is patient on multiple antipsychotic therapies at discharge:  No  Has Patient had three or more failed trials of antipsychotic monotherapy by history: NA Recommended Plan for Multiple Antipsychotic Therapies: NA   Discharge Instructions     Diet - low sodium heart healthy   Complete by: As directed    Increase activity slowly   Complete by: As directed         Allergies as of 04/12/2024   No Known Allergies      Medication List     STOP taking these medications    CertaVite/Antioxidants Tabs   thiamine  100 MG tablet Commonly known as: VITAMIN B1       TAKE these medications      Indication  Cholecalciferol  125 MCG (5000 UT) Tabs Take 1 tablet (5,000 Units total) by mouth daily.  Indication: Vitamin D  Deficiency   diclofenac  Sodium 1 % Gel Commonly known as: VOLTAREN  Apply 2 g topically 4 (four) times daily.  Indication: Joint Damage causing Pain and Loss of Function   DULoxetine  60 MG capsule Commonly known as: CYMBALTA  Take 1 capsule (60 mg total) by mouth daily. Start  taking on: Apr 13, 2024  Indication: Major Depressive Disorder   ferrous sulfate  325 (65 FE) MG tablet Take 1 tablet (325 mg total) by mouth at bedtime.  Indication: per md instructions   folic acid  1 MG tablet Commonly known as: FOLVITE  Take 1 tablet (1 mg total) by mouth daily.  Indication: Anemia From Inadequate Folic Acid    gabapentin  300 MG capsule Commonly known as: NEURONTIN  Take 2 capsules (600 mg total) by mouth 3 (three) times daily.  Indication: Neuropathic Pain   hydrOXYzine  25 MG tablet Commonly known as: ATARAX  Take 1 tablet (25 mg total) by mouth every 6 (six) hours as needed for anxiety (or CIWA score </=  10).  Indication: Feeling Anxious   multivitamin with minerals Tabs tablet Take 1 tablet by mouth daily.  Indication: Nutritional support   naltrexone  50 MG tablet Commonly known as: DEPADE Take 1 tablet (50 mg total) by mouth daily.  Indication: Abuse or Misuse of Alcohol   pantoprazole  40 MG tablet Commonly known as: PROTONIX  Take 1 tablet (40 mg total) by mouth daily.  Indication: Gastroesophageal Reflux Disease   propranolol  10 MG tablet Commonly known as: INDERAL  Take 1 tablet (10 mg total) by mouth 2 (two) times daily. What changed: Another medication with the same name was added. Make sure you understand how and when to take each.  Indication: Feeling Anxious   propranolol  10 MG tablet Commonly known as: INDERAL  Take 1 tablet (10 mg total) by mouth 2 (two) times daily. What changed: You were already taking a medication with the same name, and this prescription was added. Make sure you understand how and when to take each.  Indication: Feeling Anxious   QUEtiapine  100 MG tablet Commonly known as: SEROQUEL  Take 1 tablet (100 mg total) by mouth at bedtime.  Indication: Generalized Anxiety Disorder, Major Depressive Disorder   Senna-S 8.6-50 MG tablet Generic drug: senna-docusate Take 2 tablets by mouth 2 (two) times daily.  Indication:  Constipation   traZODone  100 MG tablet Commonly known as: DESYREL  Take 1 tablet (100 mg total) by mouth at bedtime.  Indication: Trouble Sleeping          Follow-up Information     [facility] Go on 04/19/2024.   Specialty: Professional Counselor Why: Please go to this provider on 04/19/24 at 9:00 am for an assessment, if you wish to receiive therapy services. You may also go Monday through Friday, from 9 am to 1 pm. Contact information: [address]  [address] [place] [address] [phone]         [facility] Follow up on 04/22/2024.   Specialty: Behavioral Health Why: You have an appointment for medication management services on 04/22/24 at 2:00 pm. Contact information: [address] [place]  [address] [phone]                   Follow-up recommendations:  - It is recommended to the patient to continue psychiatric medications as prescribed, after discharge from the hospital.   - It is recommended to the patient to follow up with your outpatient psychiatric provider and PCP. - It was discussed with the patient, the impact of alcohol, drugs, tobacco have been there overall psychiatric and medical wellbeing, and total abstinence from substance use was recommended the patient. - Prescriptions provided or sent directly to preferred pharmacy at discharge. Patient agreeable to plan. Given opportunity to ask questions. Appears to feel comfortable with discharge.   - In the event of worsening symptoms, the patient is instructed to call the crisis hotline, 911 and or go to the nearest ED for appropriate evaluation and treatment of symptoms. To follow-up with primary care provider for other medical issues, concerns and or health care needs - Patient was discharged home with a plan to follow up as noted above.   Comments:  NA  Signed: Clair Crews, MD 04/12/24 2:05 PM

## 2024-04-12 NOTE — Group Note (Signed)
 Recreation Therapy Group Note   Group Topic:Team Building  Group Date: 04/12/2024 Start Time: 0940 End Time: 1005 Facilitators: Inge Waldroup-McCall, LRT,CTRS Location: [address] Day[address]   Group Topic: Communication, Team Building, Problem Solving  Goal Area(s) Addresses:  Patient will effectively work with peer towards shared goal.  Patient will identify skills used to make activity successful.  Patient will identify how skills used during activity can be used to reach post d/c goals.   Behavioral Response:   Intervention: STEM Activity  Activity: Straw Bridge. In teams of 3-5, patients were given 15 plastic drinking straws and an equal length of masking tape. Using the materials provided, patients were instructed to build a free standing bridge-like structure to suspend an everyday item (ex: puzzle box) off of the floor or table surface. All materials were required to be used by the team in their design. LRT facilitated post-activity discussion reviewing team process. Patients were encouraged to reflect how the skills used in this activity can be generalized to daily life post discharge.   Education: Pharmacist, community, Scientist, physiological, Discharge Planning   Education Outcome: Acknowledges education/In group clarification offered/Needs additional education.    Affect/Mood: N/A   Participation Level: Did not attend    Clinical Observations/Individualized Feedback:     Plan: Continue to engage patient in RT group sessions 2-3x/week.   Xavier Owens, LRT,CTRS 04/12/2024 12:36 PM

## 2024-04-19 ENCOUNTER — Emergency Department (HOSPITAL_COMMUNITY): Payer: MEDICAID

## 2024-04-19 ENCOUNTER — Encounter (HOSPITAL_COMMUNITY): Payer: Self-pay

## 2024-04-19 ENCOUNTER — Emergency Department (HOSPITAL_COMMUNITY)
Admission: EM | Admit: 2024-04-19 | Discharge: 2024-04-19 | Disposition: A | Discharge status: Home / Self Care | Payer: MEDICAID | Attending: Emergency Medicine | Admitting: Emergency Medicine

## 2024-04-19 ENCOUNTER — Other Ambulatory Visit: Payer: Self-pay

## 2024-04-19 DIAGNOSIS — Z79899 Other long term (current) drug therapy: Secondary | ICD-10-CM | POA: Diagnosis not present

## 2024-04-19 DIAGNOSIS — I808 Phlebitis and thrombophlebitis of other sites: Secondary | ICD-10-CM | POA: Diagnosis not present

## 2024-04-19 DIAGNOSIS — M79601 Pain in right arm: Secondary | ICD-10-CM | POA: Diagnosis present

## 2024-04-19 DIAGNOSIS — I10 Essential (primary) hypertension: Secondary | ICD-10-CM | POA: Insufficient documentation

## 2024-04-19 MED ORDER — KETOROLAC TROMETHAMINE 15 MG/ML IJ SOLN
15.0000 mg | Freq: Once | INTRAMUSCULAR | Status: AC
  Administered 2024-04-19: 15 mg via INTRAMUSCULAR
  Filled 2024-04-19: qty 1

## 2024-04-19 NOTE — ED Notes (Signed)
 Pt left b4 receiving D/C papers

## 2024-04-19 NOTE — ED Provider Notes (Signed)
  EMERGENCY DEPARTMENT AT [facility] Provider Note   CSN: [id number] Arrival date & time: 04/19/24  1026     History  Chief Complaint  Patient presents with   Arm Pain    Xavier Owens is a 53 y.o. male.  Patient with past medical history of alcohol abuse, seizures and hypertension is reporting to the emergency room with complaint of right arm pain.  Patient reports recent admission into the hospital, now has area of swelling and pain where his IV was placed.  He denies fever or surrounding redness. No swelling forearm or hand. No numbness or tingling. No injury or fall.    Arm Pain       Home Medications Prior to Admission medications   Medication Sig Start Date End Date Taking? Authorizing Provider  Cholecalciferol  125 MCG (5000 UT) TABS Take 1 tablet (5,000 Units total) by mouth daily. Patient not taking: Reported on 04/03/2024 02/03/24   Timmothy Foots, MD  diclofenac  Sodium (VOLTAREN ) 1 % GEL Apply 2 g topically 4 (four) times daily. 04/12/24   Timmothy Foots, MD  DULoxetine  (CYMBALTA ) 60 MG capsule Take 1 capsule (60 mg total) by mouth daily. 04/13/24   Timmothy Foots, MD  ferrous sulfate  325 (65 FE) MG tablet Take 1 tablet (325 mg total) by mouth at bedtime. 04/12/24   Timmothy Foots, MD  folic acid  (FOLVITE ) 1 MG tablet Take 1 tablet (1 mg total) by mouth daily. 04/12/24   Timmothy Foots, MD  gabapentin  (NEURONTIN ) 300 MG capsule Take 2 capsules (600 mg total) by mouth 3 (three) times daily. 02/07/24   Sheree Dieter, MD  hydrOXYzine  (ATARAX ) 25 MG tablet Take 1 tablet (25 mg total) by mouth every 6 (six) hours as needed for anxiety (or CIWA score </= 10). 04/12/24   Timmothy Foots, MD  Multiple Vitamin (MULTIVITAMIN WITH MINERALS) TABS tablet Take 1 tablet by mouth daily. Patient not taking: Reported on 04/03/2024 02/07/24   Sheree Dieter, MD  naltrexone  (DEPADE) 50 MG tablet Take 1 tablet (50 mg total) by mouth daily. 04/12/24   Timmothy Foots, MD  pantoprazole  (PROTONIX ) 40 MG tablet Take 1 tablet (40 mg total) by mouth daily. 04/12/24   Timmothy Foots, MD  propranolol  (INDERAL ) 10 MG tablet Take 1 tablet (10 mg total) by mouth 2 (two) times daily. 04/12/24   Timmothy Foots, MD  propranolol  (INDERAL ) 10 MG tablet Take 1 tablet (10 mg total) by mouth 2 (two) times daily. 04/12/24   Timmothy Foots, MD  QUEtiapine  (SEROQUEL ) 100 MG tablet Take 1 tablet (100 mg total) by mouth at bedtime. 04/12/24   Timmothy Foots, MD  senna-docusate (SENOKOT-S) 8.6-50 MG tablet Take 2 tablets by mouth 2 (two) times daily. Patient not taking: Reported on 04/03/2024 03/22/24   Sheree Dieter, MD  traZODone  (DESYREL ) 100 MG tablet Take 1 tablet (100 mg total) by mouth at bedtime. 04/12/24   Timmothy Foots, MD      Allergies    Patient has no known allergies.    Review of Systems   Review of Systems  Skin:  Positive for rash.    Physical Exam Updated Vital Signs BP 94/73   Pulse 66   Temp 97.7 F (36.5 C) (Oral)   Resp 16   Ht 5\' 8"  (1.727 m)   Wt 72.6 kg   SpO2 96%   BMI 24.33 kg/m  Physical Exam Vitals and nursing note reviewed.  Constitutional:  General: He is not in acute distress.    Appearance: He is not toxic-appearing.  HENT:     Head: Normocephalic and atraumatic.  Eyes:     General: No scleral icterus.    Conjunctiva/sclera: Conjunctivae normal.  Cardiovascular:     Rate and Rhythm: Normal rate and regular rhythm.     Pulses: Normal pulses.     Heart sounds: Normal heart sounds.  Pulmonary:     Effort: Pulmonary effort is normal. No respiratory distress.     Breath sounds: Normal breath sounds.  Abdominal:     General: Abdomen is flat. Bowel sounds are normal.     Palpations: Abdomen is soft.     Tenderness: There is no abdominal tenderness.  Skin:    General: Skin is warm and dry.     Findings: No lesion.     Comments: Tenderness over right superficial forearm vein consistent with phlebitis, no sign of  surrounding erythema or infection.   Neurological:     General: No focal deficit present.     Mental Status: He is alert and oriented to person, place, and time. Mental status is at baseline.     ED Results / Procedures / Treatments   Labs (all labs ordered are listed, but only abnormal results are displayed) Labs Reviewed - No data to display  EKG None  Radiology DG Forearm Right Result Date: 04/19/2024 CLINICAL DATA:  Right arm pain and swelling for 1 week. EXAM: RIGHT FOREARM - 2 VIEW COMPARISON:  None Available. FINDINGS: There is no evidence of fracture or other focal bone lesions. Soft tissues are unremarkable. IMPRESSION: Negative. Electronically Signed   By: Rosalene Colon M.D.   On: 04/19/2024 13:49    Procedures Procedures    Medications Ordered in ED Medications  ketorolac  (TORADOL ) 15 MG/ML injection 15 mg (has no administration in time range)    ED Course/ Medical Decision Making/ A&P                                 Medical Decision Making Amount and/or Complexity of Data Reviewed Radiology: ordered.  Risk Prescription drug management.   This patient presents to the ED for concern of right arm pain, this involves an extensive number of treatment options, and is a complaint that carries with it a high risk of complications and morbidity.  The differential diagnosis includes FB, fracture, DVT, phlebitis, thrombophlebitis, cellulitis, abscess   Imaging Studies ordered:  I ordered imaging studies including right forearm x-ray I independently visualized and interpreted imaging which showed no acute findings I agree with the radiologist interpretation   Problem List / ED Course / Critical interventions / Medication management  Patient presenting to emergency room with complaint of right forearm pain.  Patient had an IV in this arm a few days ago during admission stay.  He does have tenderness over the vein with some mild swelling directly over this area,  diffuse swelling of his arm.  He has strong radial pulse.  He has no signs of infection.  His x-ray shows no foreign body or acute findings.  Given exam feel like this is most consistent with phlebitis.  Patient is feeling better after Toradol  and warm compress here.  Will send home with symptomatic management and follow-up with primary care.  He was given return precautions. I ordered medication including Toradol  and warm compress Reevaluation of the patient after these medicines showed  that the patient improved I have reviewed the patients home medicines and have made adjustments as needed   Plan  F/u w/ PCP in 2-3d to ensure resolution of sx.  Patient was given return precautions. Patient stable for discharge at this time.  Patient educated on sx/dx and verbalized understanding of plan. Return to ER w/ new or worsening sx.          Final Clinical Impression(s) / ED Diagnoses Final diagnoses:  Phlebitis of forearm    Rx / DC Orders ED Discharge Orders     None         Eudora Heron, [name] 04/19/24 1601    Auston Blush, MD 04/21/24 [phone]

## 2024-04-19 NOTE — ED Notes (Signed)
 Patient transported to X-ray

## 2024-04-19 NOTE — Discharge Instructions (Signed)
 I recommend warm compress over right forearm and antiinflammatories for pain control and swelling. There is no sign of infection today, but look for signs of infection redness, swelling, fever.

## 2024-04-19 NOTE — ED Triage Notes (Signed)
 Pt states "we left something in his arm" last week. RN accessed arm and it looks to be patients vein, pt denies. Denies suicidal and homicide

## 2024-04-22 ENCOUNTER — Encounter (HOSPITAL_COMMUNITY): Payer: Self-pay | Admitting: Psychiatry

## 2024-04-22 ENCOUNTER — Ambulatory Visit (HOSPITAL_COMMUNITY): Payer: MEDICAID | Admitting: Psychiatry

## 2024-04-22 DIAGNOSIS — F1020 Alcohol dependence, uncomplicated: Secondary | ICD-10-CM | POA: Diagnosis not present

## 2024-04-22 DIAGNOSIS — F411 Generalized anxiety disorder: Secondary | ICD-10-CM

## 2024-04-22 DIAGNOSIS — M792 Neuralgia and neuritis, unspecified: Secondary | ICD-10-CM

## 2024-04-22 MED ORDER — TRAZODONE HCL 100 MG PO TABS
100.0000 mg | ORAL_TABLET | Freq: Every day | ORAL | 3 refills | Status: DC
Start: 2024-04-22 — End: 2024-05-24

## 2024-04-22 MED ORDER — QUETIAPINE FUMARATE 100 MG PO TABS: 100.0000 mg | ORAL_TABLET | Freq: Every day | ORAL | 3 refills | Status: DC

## 2024-04-22 MED ORDER — HYDROXYZINE HCL 25 MG PO TABS: 25.0000 mg | ORAL_TABLET | Freq: Four times a day (QID) | ORAL | 3 refills | Status: DC | PRN

## 2024-04-22 MED ORDER — PROPRANOLOL HCL 10 MG PO TABS
10.0000 mg | ORAL_TABLET | Freq: Two times a day (BID) | ORAL | 3 refills | Status: DC
Start: 2024-04-22 — End: 2024-05-24

## 2024-04-22 MED ORDER — DULOXETINE HCL 60 MG PO CPEP: 60.0000 mg | ORAL_CAPSULE | Freq: Every day | ORAL | 3 refills | Status: DC

## 2024-04-22 MED ORDER — GABAPENTIN 300 MG PO CAPS
600.0000 mg | ORAL_CAPSULE | Freq: Three times a day (TID) | ORAL | 3 refills | Status: DC
Start: 2024-04-22 — End: 2024-05-24

## 2024-04-22 MED ORDER — NALTREXONE HCL 50 MG PO TABS: 50.0000 mg | ORAL_TABLET | Freq: Every day | ORAL | 3 refills | Status: DC

## 2024-04-22 NOTE — Progress Notes (Signed)
 Psychiatric Initial Adult Assessment  Virtual Visit via Video Note  I connected with Xavier Owens on 04/22/24 at  2:00 PM EDT by a video enabled telemedicine application and verified that I am speaking with the correct person using two identifiers.  Location: Patient: Home Provider: Clinic   I discussed the limitations of evaluation and management by telemedicine and the availability of in person appointments. The patient expressed understanding and agreed to proceed.  I provided 45 minutes of non-face-to-face time during this encounter.   Patient Identification: Xavier Owens MRN:  [id number] Date of Evaluation:  04/22/2024 Referral Source: [facility] Chief Complaint:  "I feel real good" Visit Diagnosis:    ICD-10-CM   1. Generalized anxiety disorder  F41.1 propranolol  (INDERAL ) 10 MG tablet    QUEtiapine  (SEROQUEL ) 100 MG tablet    traZODone  (DESYREL ) 100 MG tablet    hydrOXYzine  (ATARAX ) 25 MG tablet    DULoxetine  (CYMBALTA ) 60 MG capsule    gabapentin  (NEURONTIN ) 300 MG capsule    2. Alcohol use disorder, moderate, dependence (HCC)  F10.20 QUEtiapine  (SEROQUEL ) 100 MG tablet    traZODone  (DESYREL ) 100 MG tablet    naltrexone  (DEPADE) 50 MG tablet    3. Neuropathic pain  M79.2 gabapentin  (NEURONTIN ) 300 MG capsule      History of Present Illness: 53 year old male seen today for initial psychiatric evaluation.  He was referred to outpatient psychiatry by [facility] where he presented on 04/03/2024.  04/12/2024.  Per chart review patient was admitted for worsening depression and suicide related to alcohol use.  Patient has a psychiatric history of MDD, alcohol use disorder and, substance induced mood disorder, alcohol withdrawal seizures, memory impairment. His medical history is as followed seizures disorder, chronic right knee pain, subclinical hypothyroidism, essential hypertension  essential tremors, and neuropathy. Currently is managed for Cymbalta  60 mg daily, gabapentin  600 mg 3  times daily, hydroxyzine  25 mg every 6 hours as needed, naltrexone  50 mg daily, propranolol  10 mg twice daily, Seroquel  100 mg nightly, and trazodone  100 mg nightly.  He reports his medications are effective in managing his psychiatric condition.  Today he is well-groomed, pleasant, cooperative, and engaged in conversation.  Patient notes that he feels better since his hospitalization.  He notes that he has been staying sober from alcohol.  He does notes that he smokes tobacco.  He reports that he smokes 1 pack of cigarettes every 4 days.  Provider offered patient Nicorette gums/patch however he was not in agreement.  Since his hospitalization patient notes that he has been working.  He is self-employed as a Camera operator.  He also notes that he is spending time with his significant other and reports that he is proud of his daughter who recently graduated and will be pursuing a career as a Publishing rights manager.  Today provider conducted GAD-7 and patient scored a 0.  Provider also conducted a PHQ-9 and the patient scored a 0.  He endorses adequate sleep and appetite.  Today he denies SI/HI/VAH, mania, or paranoia.  Today provider conducted an audit assessment and patient scored a 0.  He notes that he is maintaining his sobriety by going to AA 3-4 times a week.  At this time he is not interested in [place].  No medication changes made today.He will continue medication at prior.  No other concerns at this time.  Associated Signs/Symptoms: Depression Symptoms:  Denies (Hypo) Manic Symptoms:  Denies Anxiety Symptoms:  Denies Psychotic Symptoms:  Denies PTSD Symptoms: NA  Past Psychiatric History: MDD, alcohol  use disorder and, substance induced mood disorder, alcohol withdrawal seizures, memory impairment  Previous Psychotropic Medications: Trazodone , Seroquel , propranolol , naltrexone , hydroxyzine , gabapentin , Cymbalta , Ambien , folic acid , B12, thiamine , Haldol , and Librium   Substance Abuse  History in the last 12 months:  Yes.    Consequences of Substance Abuse: Medical Consequences:  Patient has 8 ED visits and 3 admissions to behavioral health Hospital within the last 6 months for alcohol use disorder, alcohol intoxication, and alcohol withdrawal seizures.  Past Medical History:  Past Medical History:  Diagnosis Date   Alcoholic (HCC)    Hypertension    Seizures (HCC)    Tremor     Past Surgical History:  Procedure Laterality Date   dislocated hip     HAND SURGERY     KNEE SURGERY      Family Psychiatric History: Denies  Family History:  Family History  Problem Relation Age of Onset   Cancer Mother    Cancer Father     Social History:   Social History   Socioeconomic History   Marital status: Married    Spouse name: Not on file   Number of children: Not on file   Years of education: Not on file   Highest education level: Not on file  Occupational History   Not on file  Tobacco Use   Smoking status: Every Day    Current packs/day: 0.25    Types: Cigarettes   Smokeless tobacco: Never  Vaping Use   Vaping status: Never Used  Substance and Sexual Activity   Alcohol use: Yes    Comment: "a fifth of vodka every day for 30 years"   Drug use: Yes    Types: Marijuana   Sexual activity: Yes  Other Topics Concern   Not on file  Social History Narrative   ** Merged History Encounter **       Social Drivers of Health   Financial Resource Strain: Not on file  Food Insecurity: No Food Insecurity (04/03/2024)   Hunger Vital Sign    Worried About Running Out of Food in the Last Year: Never true    Ran Out of Food in the Last Year: Never true  Transportation Needs: No Transportation Needs (04/03/2024)   PRAPARE - Administrator, Civil Service (Medical): No    Lack of Transportation (Non-Medical): No  Physical Activity: Not on file  Stress: Not on file  Social Connections: Socially Integrated (01/24/2024)   Social Connection and Isolation  Panel [NHANES]    Frequency of Communication with Friends and Family: More than three times a week    Frequency of Social Gatherings with Friends and Family: More than three times a week    Attends Religious Services: More than 4 times per year    Active Member of [employer] or Organizations: Yes    Attends Engineer, structural: More than 4 times per year    Marital Status: Living with partner    Additional Social History: Patient resides in [place].  He is dating and has one 11 year old daughter.  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  He endorses drinking 1 pint of alcohol/liquor a week prior to his hospitalization but is sober now.  He also reports smoking 1 pack of cigarettes every 4 days.  He denies illegal drug use.  Allergies:  No Known Allergies  Metabolic Disorder Labs: Lab Results  Component Value Date   HGBA1C 4.5 (L) 01/29/2024   MPG 82.45 01/29/2024   MPG 97 12/09/2021  No results found for: "PROLACTIN" Lab Results  Component Value Date   CHOL 169 04/04/2024   TRIG 195 (H) 04/04/2024   HDL 56 04/04/2024   CHOLHDL 3.0 04/04/2024   VLDL 39 04/04/2024   LDLCALC 74 04/04/2024   LDLCALC 91 01/29/2024   Lab Results  Component Value Date   TSH 4.117 01/29/2024    Therapeutic Level Labs: No results found for: "LITHIUM" No results found for: "CBMZ" No results found for: "VALPROATE"  Current Medications: Current Outpatient Medications  Medication Sig Dispense Refill   Cholecalciferol  125 MCG (5000 UT) TABS Take 1 tablet (5,000 Units total) by mouth daily. (Patient not taking: Reported on 04/03/2024) 90 tablet 0   diclofenac  Sodium (VOLTAREN ) 1 % GEL Apply 2 g topically 4 (four) times daily.     DULoxetine  (CYMBALTA ) 60 MG capsule Take 1 capsule (60 mg total) by mouth daily. 30 capsule 3   ferrous sulfate  325 (65 FE) MG tablet Take 1 tablet (325 mg total) by mouth at bedtime. 30 tablet 0   folic acid  (FOLVITE ) 1 MG tablet Take 1 tablet (1 mg total) by mouth  daily. 14 tablet 0   gabapentin  (NEURONTIN ) 300 MG capsule Take 2 capsules (600 mg total) by mouth 3 (three) times daily. 360 capsule 3   hydrOXYzine  (ATARAX ) 25 MG tablet Take 1 tablet (25 mg total) by mouth every 6 (six) hours as needed for anxiety (or CIWA score </= 10). 90 tablet 3   Multiple Vitamin (MULTIVITAMIN WITH MINERALS) TABS tablet Take 1 tablet by mouth daily. (Patient not taking: Reported on 04/03/2024) 90 tablet 0   naltrexone  (DEPADE) 50 MG tablet Take 1 tablet (50 mg total) by mouth daily. 30 tablet 3   pantoprazole  (PROTONIX ) 40 MG tablet Take 1 tablet (40 mg total) by mouth daily. 30 tablet 0   propranolol  (INDERAL ) 10 MG tablet Take 1 tablet (10 mg total) by mouth 2 (two) times daily. 60 tablet 3   QUEtiapine  (SEROQUEL ) 100 MG tablet Take 1 tablet (100 mg total) by mouth at bedtime. 30 tablet 3   senna-docusate (SENOKOT-S) 8.6-50 MG tablet Take 2 tablets by mouth 2 (two) times daily. (Patient not taking: Reported on 04/03/2024) 120 tablet 0   traZODone  (DESYREL ) 100 MG tablet Take 1 tablet (100 mg total) by mouth at bedtime. 30 tablet 3   No current facility-administered medications for this visit.    Musculoskeletal: Strength & Muscle Tone: within normal limits and Telehealth visit Gait & Station: normal, Telehealth visit Patient leans: N/A  Psychiatric Specialty Exam: Review of Systems  There were no vitals taken for this visit.There is no height or weight on file to calculate BMI.  General Appearance: Well Groomed  Eye Contact:  Good  Speech:  Clear and Coherent and Normal Rate  Volume:  Normal  Mood:  Euthymic  Affect:  Appropriate and Congruent  Thought Process:  Coherent, Goal Directed, and Linear  Orientation:  Full (Time, Place, and Person)  Thought Content:  WDL and Logical  Suicidal Thoughts:  No  Homicidal Thoughts:  No  Memory:  Immediate;   Good Recent;   Fair Remote;   Fair  Judgement:  Good  Insight:  Good  Psychomotor Activity:  Normal   Concentration:  Concentration: Good and Attention Span: Good  Recall:  Good  Fund of Knowledge:Good  Language: Good  Akathisia:  No  Handed:  Right  AIMS (if indicated):  not done  Assets:  Communication Skills Desire for Improvement Financial Resources/Insurance Housing  Leisure Time Social Cabin crew  ADL's:  Intact  Cognition: WNL  Sleep:  Good   Screenings: AIMS    Flowsheet Row [facility]  AIMS Total Score 0      AUDIT    Flowsheet Row Office Visit from [facility]/22/2024 in [facility] Admission (Discharged) [facility] Ad[facility] (Discharged) [facility] (Discharged[facility]  Alcohol Use Disorder Identification Test Final Score (AUDIT) 6 10 0 29      CAGE-AID    Flowsheet Row ED to Hosp-Admission (Discharged) from 06/22/2021 in [facility]  CAGE-AID Score 3      GAD-7    Flowsheet Row Office Visit from 04/22/2024 in [facility]  Total GAD-7 Score 0      PHQ2-9    Flowsheet Row Office Visit from 04/22/2024 in [facility] Office Visit from 02/07/2024 in [facility] - A Dept Of [place]. [facility] ED from 01/18/2024 in [facility] Office Visit from 06/10/2022 in [facility] - A Dept Of [place]. [facility] Office Visit from 12/21/2021 in [facility] - A Dept Of [place]. [facility]  PHQ-2 Total Score 0 0 0 0 0  PHQ-9 Total Score 0 -- 3 -- 3      Flowsheet Row ED from 04/19/2024 in [facility] at [facility] Admission (Discharged) [facility] from 04/02/2024 in [facility] Emergency Department at [facility]  C-SSRS RISK CATEGORY No Risk Low Risk Low Risk       Assessment and Plan: Patient notes that he is doing well on his current medication regimen. No medication changes made today. Patient agreeable to continue medications as prescribed.  1. Alcohol use disorder, moderate, dependence (HCC)  Continue- QUEtiapine  (SEROQUEL ) 100 MG tablet; Take 1 tablet (100 mg total) by mouth at bedtime.  Dispense: 30 tablet; Refill: 3 Continue- traZODone  (DESYREL ) 100 MG tablet; Take 1 tablet (100 mg total) by mouth at bedtime.  Dispense: 30 tablet; Refill: 3 Continue- naltrexone  (DEPADE) 50 MG tablet; Take 1 tablet (50 mg total) by mouth daily.  Dispense: 30 tablet; Refill: 3  2. Neuropathic pain  Continue- gabapentin  (NEURONTIN ) 300 MG capsule; Take 2 capsules (600 mg total) by mouth 3 (three) times daily.  Dispense: 360 capsule; Refill: 3  3. Generalized anxiety disorder (Primary)  Continue- propranolol  (INDERAL ) 10 MG tablet; Take 1 tablet (10 mg total) by mouth 2 (two) times daily.  Dispense: 60 tablet; Refill: 3 Continue- QUEtiapine  (SEROQUEL ) 100 MG tablet; Take 1 tablet (100 mg total) by mouth at bedtime.  Dispense: 30 tablet; Refill: 3 Continue- traZODone  (DESYREL ) 100 MG tablet; Take 1 tablet (100 mg total) by mouth at bedtime.  Dispense: 30 tablet; Refill: 3 Continue- hydrOXYzine  (ATARAX ) 25 MG tablet; Take 1 tablet (25 mg total) by mouth every 6 (six) hours as needed for anxiety (or CIWA score </= 10).  Dispense: 90 tablet; Refill: 3 Continue- DULoxetine  (CYMBALTA ) 60 MG capsule; Take 1 capsule (60 mg total) by mouth daily.  Dispense: 30 capsule; Refill: 3 Continue- gabapentin  (NEURONTIN ) 300 MG capsule; Take 2 capsules (600 mg total) by mouth 3 (  three) times daily.  Dispense: 360 capsule; Refill: 3   Collaboration of Care: Other provider involved in patient's care AEB PCP and primary  psychiatric provider  Patient/Guardian was advised Release of Information must be obtained prior to any record release in order to collaborate their care with an outside provider. Patient/Guardian was advised if they have not already done so to contact the registration department to sign all necessary forms in order for us  to release information regarding their care.   Consent: Patient/Guardian gives verbal consent for treatment and assignment of benefits for services provided during this visit. Patient/Guardian expressed understanding and agreed to proceed.  Follow-up in 3 months  Arlyne Bering, NP 6/9/20252:12 PM

## 2024-04-29 ENCOUNTER — Encounter: Payer: Self-pay | Admitting: *Deleted

## 2024-05-02 ENCOUNTER — Encounter: Payer: Self-pay | Admitting: Neurology

## 2024-05-02 ENCOUNTER — Ambulatory Visit (INDEPENDENT_AMBULATORY_CARE_PROVIDER_SITE_OTHER): Payer: MEDICAID | Admitting: Neurology

## 2024-05-02 VITALS — BP 118/78 | Resp 15 | Ht 68.5 in | Wt 160.0 lb

## 2024-05-02 DIAGNOSIS — R569 Unspecified convulsions: Secondary | ICD-10-CM

## 2024-05-02 DIAGNOSIS — F1020 Alcohol dependence, uncomplicated: Secondary | ICD-10-CM

## 2024-05-02 DIAGNOSIS — R4189 Other symptoms and signs involving cognitive functions and awareness: Secondary | ICD-10-CM

## 2024-05-02 DIAGNOSIS — F1093 Unspecified convulsions: Secondary | ICD-10-CM

## 2024-05-02 NOTE — Progress Notes (Signed)
 [facility]  PATIENT: Xavier Owens DOB: 18-Dec-1970  REQUESTING CLINICIAN: Bevelyn Bryant, MD HISTORY FROM: Patient  REASON FOR VISIT: Memory loss    HISTORICAL  CHIEF COMPLAINT:  Chief Complaint  Patient presents with   New Patient (Initial Visit)    RM12, ALONE, PT IS HERE TO ESTABLISH CARE   Seizures    RM12, ALONE, NP/internal referral for hx seizures: last sz 2 months ago and denied missing sz meds   Memory Loss    RM12, alone, NP/internal referral for memory impairment: MMSE SCORE OF 21    HISTORY OF PRESENT ILLNESS:  This is a 53 year old gentleman past medical history of chronic alcohol use, alcohol related seizures, hypertension, anxiety who is presenting with memory loss.  Patient described memory loss as cannot remember like he used to in the past.  He will forget something that he just read about, reports misplacing items.  He also reports that he cannot even remember the alphabet.  This has been going on for the past year and getting worse.  He tells me that he lives alone, he is independent all actives of daily living.  He does have history of chronic alcohol use, still drinks.  And also has alcohol withdrawal seizures. He also reported a strong family history of dementia including maternal grandfather, paternal grandmother and grandmother sister.  Now that he is having memory loss he is concerned about developing dementia.    TBI:  No past history of TBI Stroke:  no past history of stroke Seizures: Yes  no past history of seizures Sleep:  no history of sleep apnea.   Mood: Yes, currently on medication.  Family history of Dementia: Maternal Grandfather, Grandmother, Grandmother sister  Functional status: independent in all ADLs and IADLs Patient lives alone. Cooking: no issues  Cleaning: no issues  Shopping: no issues  Bathing: no issues Toileting: non issues  Driving: no issues  Bills: no issues  Medications: no issues  Ever left the  stove on by accident?: denies  Forget how to use items around the house?: denies  Getting lost going to familiar places?: denies  Forgetting loved ones names?: Yes  Word finding difficulty? Yes  Sleep: Good    OTHER MEDICAL CONDITIONS: Chronic alcohol use, Alcohol related seizures, hypertension, anxiety    REVIEW OF SYSTEMS: Full 14 system review of systems performed and negative with exception of: As noted in the HPI   ALLERGIES: No Known Allergies  HOME MEDICATIONS: Outpatient Medications Prior to Visit  Medication Sig Dispense Refill   Cholecalciferol  125 MCG (5000 UT) TABS Take 1 tablet (5,000 Units total) by mouth daily. 90 tablet 0   diclofenac  Sodium (VOLTAREN ) 1 % GEL Apply 2 g topically 4 (four) times daily.     DULoxetine  (CYMBALTA ) 60 MG capsule Take 1 capsule (60 mg total) by mouth daily. 30 capsule 3   ferrous sulfate  325 (65 FE) MG tablet Take 1 tablet (325 mg total) by mouth at bedtime. 30 tablet 0   folic acid  (FOLVITE ) 1 MG tablet Take 1 tablet (1 mg total) by mouth daily. 14 tablet 0   gabapentin  (NEURONTIN ) 300 MG capsule Take 2 capsules (600 mg total) by mouth 3 (three) times daily. 360 capsule 3   hydrOXYzine  (ATARAX ) 25 MG tablet Take 1 tablet (25 mg total) by mouth every 6 (six) hours as needed for anxiety (or CIWA score </= 10). 90 tablet 3   Multiple Vitamin (MULTIVITAMIN WITH MINERALS) TABS tablet Take 1 tablet by mouth  daily. 90 tablet 0   naltrexone  (DEPADE) 50 MG tablet Take 1 tablet (50 mg total) by mouth daily. 30 tablet 3   pantoprazole  (PROTONIX ) 40 MG tablet Take 1 tablet (40 mg total) by mouth daily. 30 tablet 0   propranolol  (INDERAL ) 10 MG tablet Take 1 tablet (10 mg total) by mouth 2 (two) times daily. 60 tablet 3   QUEtiapine  (SEROQUEL ) 100 MG tablet Take 1 tablet (100 mg total) by mouth at bedtime. 30 tablet 3   senna-docusate (SENOKOT-S) 8.6-50 MG tablet Take 2 tablets by mouth 2 (two) times daily. 120 tablet 0   traZODone  (DESYREL ) 100 MG  tablet Take 1 tablet (100 mg total) by mouth at bedtime. 30 tablet 3   No facility-administered medications prior to visit.    PAST MEDICAL HISTORY: Past Medical History:  Diagnosis Date   Alcoholic (HCC)    Hypertension    Seizures (HCC)    Tremor     PAST SURGICAL HISTORY: Past Surgical History:  Procedure Laterality Date   dislocated hip     HAND SURGERY     KNEE SURGERY      FAMILY HISTORY: Family History  Problem Relation Age of Onset   Cancer Mother    Cancer Father     SOCIAL HISTORY: Social History   Socioeconomic History   Marital status: Married    Spouse name: Not on file   Number of children: Not on file   Years of education: Not on file   Highest education level: Not on file  Occupational History   Not on file  Tobacco Use   Smoking status: Every Day    Current packs/day: 0.25    Types: Cigarettes   Smokeless tobacco: Never  Vaping Use   Vaping status: Never Used  Substance and Sexual Activity   Alcohol use: Yes    Comment: a fifth of vodka every day for 30 years   Drug use: Yes    Types: Marijuana   Sexual activity: Yes  Other Topics Concern   Not on file  Social History Narrative   ** Merged History Encounter **       Social Drivers of Health   Financial Resource Strain: Not on file  Food Insecurity: No Food Insecurity (04/03/2024)   Hunger Vital Sign    Worried About Running Out of Food in the Last Year: Never true    Ran Out of Food in the Last Year: Never true  Transportation Needs: No Transportation Needs (04/03/2024)   PRAPARE - Administrator, Civil Service (Medical): No    Lack of Transportation (Non-Medical): No  Physical Activity: Not on file  Stress: Not on file  Social Connections: Socially Integrated (01/24/2024)   Social Connection and Isolation Panel    Frequency of Communication with Friends and Family: More than three times a week    Frequency of Social Gatherings with Friends and Family: More than  three times a week    Attends Religious Services: More than 4 times per year    Active Member of [employer] or Organizations: Yes    Attends Banker Meetings: More than 4 times per year    Marital Status: Living with partner  Intimate Partner Violence: Not At Risk (04/03/2024)   Humiliation, Afraid, Rape, and Kick questionnaire    Fear of Current or Ex-Partner: No    Emotionally Abused: No    Physically Abused: No    Sexually Abused: No    PHYSICAL EXAM  GENERAL EXAM/CONSTITUTIONAL: Vitals:  Vitals:   05/02/24 1022  BP: 118/78  Resp: 15  Weight: 160 lb (72.6 kg)  Height: 5' 8.5 (1.74 m)   Body mass index is 23.97 kg/m. Wt Readings from Last 3 Encounters:  05/02/24 160 lb (72.6 kg)  04/19/24 160 lb (72.6 kg)  03/16/24 179 lb 14.3 oz (81.6 kg)   Patient is in no distress; well developed, nourished and groomed; neck is supple  MUSCULOSKELETAL: Gait, strength, tone, movements noted in Neurologic exam below  NEUROLOGIC: MENTAL STATUS:     05/02/2024   10:25 AM  MMSE - Mini Mental State Exam  Orientation to time 4  Orientation to Place 5  Registration 3  Attention/ Calculation 0  Recall 0  Language- name 2 objects 2  Language- repeat 1  Language- follow 3 step command 3  Language- read & follow direction 1  Write a sentence 1  Copy design 1  Total score 21    CRANIAL NERVE:  2nd, 3rd, 4th, 6th- visual fields full to confrontation, extraocular muscles intact, no nystagmus 5th - facial sensation symmetric 7th - facial strength symmetric 8th - hearing intact 9th - palate elevates symmetrically, uvula midline 11th - shoulder shrug symmetric 12th - tongue protrusion midline  MOTOR:  normal bulk and tone, full strength in the BUE, BLE  SENSORY:  normal and symmetric to light touch  COORDINATION:  finger-nose-finger, fine finger movements normal  GAIT/STATION:  Wide based, not ataxic      DIAGNOSTIC DATA (LABS, IMAGING, TESTING) - I reviewed  patient records, labs, notes, testing and imaging myself where available.  Lab Results  Component Value Date   WBC 6.3 04/02/2024   HGB 12.7 (L) 04/02/2024   HCT 39.1 04/02/2024   MCV 88.1 04/02/2024   PLT 448 (H) 04/02/2024      Component Value Date/Time   NA 135 04/04/2024 1829   NA 140 06/10/2022 1028   K 3.8 04/04/2024 1829   CL 102 04/04/2024 1829   CO2 22 04/04/2024 1829   GLUCOSE 109 (H) 04/04/2024 1829   BUN 6 04/04/2024 1829   BUN 7 06/10/2022 1028   CREATININE 0.74 04/04/2024 1829   CALCIUM  9.1 04/04/2024 1829   PROT 8.1 04/02/2024 2304   PROT 7.7 06/10/2022 1028   ALBUMIN 3.8 04/02/2024 2304   ALBUMIN 4.5 06/10/2022 1028   AST 38 04/02/2024 2304   ALT 23 04/02/2024 2304   ALKPHOS 116 04/02/2024 2304   BILITOT 0.8 04/02/2024 2304   BILITOT 0.3 06/10/2022 1028   GFRNONAA >60 04/04/2024 1829   GFRAA >60 04/13/2016 0839   Lab Results  Component Value Date   CHOL 169 04/04/2024   HDL 56 04/04/2024   LDLCALC 74 04/04/2024   TRIG 195 (H) 04/04/2024   CHOLHDL 3.0 04/04/2024   Lab Results  Component Value Date   HGBA1C 4.5 (L) 01/29/2024   Lab Results  Component Value Date   VITAMINB12 585 01/29/2024   Lab Results  Component Value Date   TSH 4.117 01/29/2024    Head CT 03/16/2024 No evidence of acute intracranial abnormality    ASSESSMENT AND PLAN  53 y.o. year old male with with history of alcohol use disorder, still drinking alcohol, alcohol withdrawal seizures, hypertension, anxiety who is presenting with memory loss described as forgetful, inability to remember something that he just read about.  On exam he scored a 21/30 MMSE indicative of impairment.  Even though he does have a strong family history of dementia I  have informed patient that I do believe that his chronic alcohol use is the reason why he is having cognitive impairment.  Her most recent alcohol levels were 257 5/20 and 495 5/2. I strongly advised him to seek assistance in stopping  alcohol.  His most recent exam including B12 and TSH were within normal limits.  Advised him to continue following up with PCP and return as needed.   1. Cognitive impairment   2. Alcohol withdrawal seizure without complication (HCC)   3. Alcohol use disorder      Patient Instructions  Continue to follow up with PCP  Discuss need for alcohol cessation Continue to follow up with PCP  Return as needed   No orders of the defined types were placed in this encounter.   No orders of the defined types were placed in this encounter.   Return if symptoms worsen or fail to improve.    [name] Cleveland, MD 05/03/2024, 3:10 PM  [facility] [address] [place], [place] [address] [phone]

## 2024-05-02 NOTE — Patient Instructions (Signed)
 Continue to follow up with PCP  Discuss need for alcohol cessation Continue to follow up with PCP  Return as needed

## 2024-05-08 ENCOUNTER — Inpatient Hospital Stay (HOSPITAL_COMMUNITY)
Admission: EM | Admit: 2024-05-08 | Discharge: 2024-05-10 | DRG: 923 | Disposition: A | Discharge status: Home / Self Care | Payer: MEDICAID | Attending: Internal Medicine | Admitting: Internal Medicine

## 2024-05-08 ENCOUNTER — Emergency Department (HOSPITAL_COMMUNITY): Payer: MEDICAID

## 2024-05-08 ENCOUNTER — Other Ambulatory Visit: Payer: Self-pay

## 2024-05-08 DIAGNOSIS — G4700 Insomnia, unspecified: Secondary | ICD-10-CM | POA: Diagnosis present

## 2024-05-08 DIAGNOSIS — F10939 Alcohol use, unspecified with withdrawal, unspecified: Secondary | ICD-10-CM | POA: Diagnosis present

## 2024-05-08 DIAGNOSIS — G621 Alcoholic polyneuropathy: Secondary | ICD-10-CM | POA: Diagnosis present

## 2024-05-08 DIAGNOSIS — G44209 Tension-type headache, unspecified, not intractable: Secondary | ICD-10-CM | POA: Diagnosis present

## 2024-05-08 DIAGNOSIS — T6701XA Heatstroke and sunstroke, initial encounter: Secondary | ICD-10-CM

## 2024-05-08 DIAGNOSIS — F1994 Other psychoactive substance use, unspecified with psychoactive substance-induced mood disorder: Secondary | ICD-10-CM | POA: Diagnosis present

## 2024-05-08 DIAGNOSIS — Z720 Tobacco use: Secondary | ICD-10-CM

## 2024-05-08 DIAGNOSIS — G629 Polyneuropathy, unspecified: Secondary | ICD-10-CM

## 2024-05-08 DIAGNOSIS — E860 Dehydration: Secondary | ICD-10-CM | POA: Diagnosis present

## 2024-05-08 DIAGNOSIS — N179 Acute kidney failure, unspecified: Secondary | ICD-10-CM | POA: Diagnosis present

## 2024-05-08 DIAGNOSIS — T673XXA Heat exhaustion, anhydrotic, initial encounter: Secondary | ICD-10-CM | POA: Diagnosis present

## 2024-05-08 DIAGNOSIS — F10930 Alcohol use, unspecified with withdrawal, uncomplicated: Principal | ICD-10-CM

## 2024-05-08 DIAGNOSIS — I10 Essential (primary) hypertension: Secondary | ICD-10-CM | POA: Diagnosis present

## 2024-05-08 DIAGNOSIS — R079 Chest pain, unspecified: Secondary | ICD-10-CM | POA: Diagnosis present

## 2024-05-08 DIAGNOSIS — E872 Acidosis, unspecified: Secondary | ICD-10-CM | POA: Diagnosis present

## 2024-05-08 DIAGNOSIS — F1020 Alcohol dependence, uncomplicated: Secondary | ICD-10-CM | POA: Diagnosis present

## 2024-05-08 DIAGNOSIS — R059 Cough, unspecified: Secondary | ICD-10-CM | POA: Diagnosis present

## 2024-05-08 DIAGNOSIS — Z79899 Other long term (current) drug therapy: Secondary | ICD-10-CM

## 2024-05-08 DIAGNOSIS — D72829 Elevated white blood cell count, unspecified: Secondary | ICD-10-CM | POA: Diagnosis present

## 2024-05-08 DIAGNOSIS — R0602 Shortness of breath: Secondary | ICD-10-CM | POA: Diagnosis present

## 2024-05-08 DIAGNOSIS — X30XXXA Exposure to excessive natural heat, initial encounter: Secondary | ICD-10-CM

## 2024-05-08 DIAGNOSIS — T675XXA Heat exhaustion, unspecified, initial encounter: Principal | ICD-10-CM | POA: Diagnosis present

## 2024-05-08 LAB — COMPREHENSIVE METABOLIC PANEL WITH GFR
ALT: 15 U/L (ref 0–44)
AST: 35 U/L (ref 15–41)
Albumin: 4.8 g/dL (ref 3.5–5.0)
Alkaline Phosphatase: 109 U/L (ref 38–126)
Anion gap: 29 — ABNORMAL HIGH (ref 5–15)
BUN: 8 mg/dL (ref 6–20)
CO2: 10 mmol/L — ABNORMAL LOW (ref 22–32)
Calcium: 10.3 mg/dL (ref 8.9–10.3)
Chloride: 103 mmol/L (ref 98–111)
Creatinine, Ser: 1.31 mg/dL — ABNORMAL HIGH (ref 0.61–1.24)
GFR, Estimated: >60 mL/min (ref >60)
Glucose, Bld: 99 mg/dL (ref 70–99)
Potassium: 4.2 mmol/L (ref 3.5–5.1)
Sodium: 142 mmol/L (ref 135–145)
Total Bilirubin: 2.4 mg/dL — ABNORMAL HIGH (ref 0.0–1.2)
Total Protein: 9.4 g/dL — ABNORMAL HIGH (ref 6.5–8.1)

## 2024-05-08 LAB — CBC
HCT: 46.3 % (ref 39.0–52.0)
Hemoglobin: 14.8 g/dL (ref 13.0–17.0)
MCH: 28.3 pg (ref 26.0–34.0)
MCHC: 32 g/dL (ref 30.0–36.0)
MCV: 88.5 fL (ref 80.0–100.0)
Platelets: 335 10*3/uL (ref 150–400)
RBC: 5.23 MIL/uL (ref 4.22–5.81)
RDW: 14.4 % (ref 11.5–15.5)
WBC: 13.3 10*3/uL — ABNORMAL HIGH (ref 4.0–10.5)
nRBC: 0 % (ref 0.0–0.2)

## 2024-05-08 LAB — TROPONIN I (HIGH SENSITIVITY)
Troponin I (High Sensitivity): 6 ng/L (ref <18)
Troponin I (High Sensitivity): 7 ng/L (ref <18)

## 2024-05-08 LAB — LIPASE, BLOOD: Lipase: 34 U/L (ref 11–51)

## 2024-05-08 LAB — CBG MONITORING, ED: Glucose-Capillary: 105 mg/dL — ABNORMAL HIGH (ref 70–99)

## 2024-05-08 MED ORDER — LACTATED RINGERS IV BOLUS
1000.0000 mL | Freq: Once | INTRAVENOUS | Status: AC
  Administered 2024-05-09: 1000 mL via INTRAVENOUS

## 2024-05-08 MED ORDER — LORAZEPAM 2 MG/ML IJ SOLN
1.0000 mg | Freq: Once | INTRAMUSCULAR | Status: AC
  Administered 2024-05-08: 1 mg via INTRAVENOUS
  Filled 2024-05-08: qty 1

## 2024-05-08 MED ORDER — ASPIRIN 81 MG PO CHEW: 324.0000 mg | CHEWABLE_TABLET | Freq: Once | ORAL | Status: DC

## 2024-05-08 MED ORDER — MORPHINE SULFATE (PF) 4 MG/ML IV SOLN
4.0000 mg | Freq: Once | INTRAVENOUS | Status: AC
  Administered 2024-05-08: 4 mg via INTRAVENOUS
  Filled 2024-05-08: qty 1

## 2024-05-08 MED ORDER — NITROGLYCERIN 0.4 MG SL SUBL
0.4000 mg | SUBLINGUAL_TABLET | SUBLINGUAL | Status: DC | PRN
Start: 2024-05-08 — End: 2024-05-10
  Filled 2024-05-08: qty 1

## 2024-05-08 MED ORDER — IOHEXOL 350 MG/ML SOLN
75.0000 mL | Freq: Once | INTRAVENOUS | Status: AC | PRN
  Administered 2024-05-08: 75 mL via INTRAVENOUS

## 2024-05-08 NOTE — ED Notes (Signed)
 Patient transported to Ultrasound

## 2024-05-08 NOTE — ED Notes (Signed)
 Patient back from [place].

## 2024-05-08 NOTE — ED Triage Notes (Signed)
 BIB [facility] from home with concern of centralized chest pain that started 5 hours ago. Patient became [facility] after chest pain, patient endorses N/V as well with dizziness.  No cardiac history    Given by EMS  nitroglycerin sublingual 324mg  aspirin   BP 200/110---> 138/62 HR 114 98 RA Cbg 100

## 2024-05-08 NOTE — ED Notes (Signed)
 Contacted CT to see when he will be going for CT, they stated there is 2 people ahead of him.

## 2024-05-08 NOTE — ED Notes (Signed)
 Patient transported to CT

## 2024-05-08 NOTE — ED Provider Notes (Signed)
 [place] EMERGENCY DEPARTMENT AT [facility] Provider Note   CSN: [id number] Arrival date & time: 05/08/24  1444     Patient presents with: Chest Pain   Xavier Owens is a 53 y.o. male.    Chest Pain    Patient has a history of alcoholism hypertension seizures tremor.  Patient presents to the ED for evaluation of chest pain.  Patient states that started about 5 hours ago.  He feels like it is a pressure in the center of his chest.  Patient feels like someone has a knee in his chest.  Patient states he has had some shortness of breath with this.  He is also has had some nausea.  He denies any leg swelling.  No fevers or chills.  Pain does not radiate.  Patient denies any history of heart disease.  He does smoke cigarettes occasionally.  He does drink alcohol occasionally.  Prior to Admission medications   Medication Sig Start Date End Date Taking? Authorizing Provider  Cholecalciferol  125 MCG (5000 UT) TABS Take 1 tablet (5,000 Units total) by mouth daily. 02/03/24   Kennyth Starleen RAMAN, MD  diclofenac  Sodium (VOLTAREN ) 1 % GEL Apply 2 g topically 4 (four) times daily. 04/12/24   Kennyth Starleen RAMAN, MD  DULoxetine  (CYMBALTA ) 60 MG capsule Take 1 capsule (60 mg total) by mouth daily. 04/22/24   Harl Zane BRAVO, NP  ferrous sulfate  325 (65 FE) MG tablet Take 1 tablet (325 mg total) by mouth at bedtime. 04/12/24   Kennyth Starleen RAMAN, MD  folic acid  (FOLVITE ) 1 MG tablet Take 1 tablet (1 mg total) by mouth daily. 04/12/24   Kennyth Starleen RAMAN, MD  gabapentin  (NEURONTIN ) 300 MG capsule Take 2 capsules (600 mg total) by mouth 3 (three) times daily. 04/22/24   Harl Zane BRAVO, NP  hydrOXYzine  (ATARAX ) 25 MG tablet Take 1 tablet (25 mg total) by mouth every 6 (six) hours as needed for anxiety (or CIWA score </= 10). 04/22/24   Harl Zane BRAVO, NP  Multiple Vitamin (MULTIVITAMIN WITH MINERALS) TABS tablet Take 1 tablet by mouth daily. 02/07/24   Gabino Boga, MD  naltrexone  (DEPADE) 50  MG tablet Take 1 tablet (50 mg total) by mouth daily. 04/22/24   Harl Zane BRAVO, NP  pantoprazole  (PROTONIX ) 40 MG tablet Take 1 tablet (40 mg total) by mouth daily. 04/12/24   Kennyth Starleen RAMAN, MD  propranolol  (INDERAL ) 10 MG tablet Take 1 tablet (10 mg total) by mouth 2 (two) times daily. 04/22/24   Harl Zane BRAVO, NP  QUEtiapine  (SEROQUEL ) 100 MG tablet Take 1 tablet (100 mg total) by mouth at bedtime. 04/22/24   Harl Zane BRAVO, NP  senna-docusate (SENOKOT-S) 8.6-50 MG tablet Take 2 tablets by mouth 2 (two) times daily. 03/22/24   Gabino Boga, MD  traZODone  (DESYREL ) 100 MG tablet Take 1 tablet (100 mg total) by mouth at bedtime. 04/22/24   Harl Zane BRAVO, NP    Allergies: Patient has no known allergies.    Review of Systems  Cardiovascular:  Positive for chest pain.    Updated Vital Signs BP (!) 139/92   Pulse 98   Temp 98.4 F (36.9 C) (Oral)   Resp 19   Ht 1.727 m (5' 8)   Wt 72.6 kg   SpO2 99%   BMI 24.33 kg/m   Physical Exam Vitals and nursing note reviewed.  Constitutional:      Appearance: He is well-developed. He is ill-appearing and diaphoretic.  HENT:  Head: Normocephalic and atraumatic.     Right Ear: External ear normal.     Left Ear: External ear normal.   Eyes:     General: No scleral icterus.       Right eye: No discharge.        Left eye: No discharge.     Conjunctiva/sclera: Conjunctivae normal.   Neck:     Trachea: No tracheal deviation.   Cardiovascular:     Rate and Rhythm: Normal rate and regular rhythm.  Pulmonary:     Effort: Pulmonary effort is normal. No respiratory distress.     Breath sounds: Normal breath sounds. No stridor. No wheezing or rales.  Abdominal:     General: Bowel sounds are normal. There is no distension.     Palpations: Abdomen is soft.     Tenderness: There is no abdominal tenderness. There is no guarding or rebound.   Musculoskeletal:        General: No tenderness or deformity.     Cervical  back: Neck supple.   Skin:    General: Skin is warm.     Findings: No rash.   Neurological:     General: No focal deficit present.     Mental Status: He is alert.     Cranial Nerves: No cranial nerve deficit, dysarthria or facial asymmetry.     Sensory: No sensory deficit.     Motor: No abnormal muscle tone or seizure activity.     Coordination: Coordination normal.   Psychiatric:        Mood and Affect: Mood normal.     (all labs ordered are listed, but only abnormal results are displayed) Labs Reviewed  CBC - Abnormal; Notable for the following components:      Result Value   WBC 13.3 (*)    All other components within normal limits  COMPREHENSIVE METABOLIC PANEL WITH GFR - Abnormal; Notable for the following components:   CO2 10 (*)    Creatinine, Ser 1.31 (*)    Total Protein 9.4 (*)    Total Bilirubin 2.4 (*)    Anion gap 29 (*)    All other components within normal limits  CBG MONITORING, ED - Abnormal; Notable for the following components:   Glucose-Capillary 105 (*)    All other components within normal limits  LIPASE, BLOOD  TROPONIN I (HIGH SENSITIVITY)  TROPONIN I (HIGH SENSITIVITY)    EKG: EKG Interpretation Date/Time:  Wednesday May 08 2024 14:56:59 EDT Ventricular Rate:  65 PR Interval:  159 QRS Duration:  112 QT Interval:  432 QTC Calculation: 450 R Axis:   20  Text Interpretation: Sinus rhythm Borderline intraventricular conduction delay RSR' in V1 or V2, right VCD or RVH Confirmed by Randol Simmonds [phone]) on 05/08/2024 2:59:47 PM  Radiology: US  Abdomen Limited RUQ (LIVER/GB) Result Date: 05/08/2024 CLINICAL DATA:  Chest pain EXAM: ULTRASOUND ABDOMEN LIMITED RIGHT UPPER QUADRANT COMPARISON:  None Available. FINDINGS: Gallbladder: No gallstones or wall thickening visualized. No sonographic Murphy sign noted by sonographer. Common bile duct: Diameter: Normal caliber, 3 mm Liver: No focal lesion identified. Within normal limits in parenchymal  echogenicity. Portal vein is patent on color Doppler imaging with normal direction of blood flow towards the liver. Other: None. IMPRESSION: Unremarkable right upper quadrant ultrasound. Electronically Signed   By: Franky Crease M.D.   On: 05/08/2024 22:27   CT Angio Chest/Abd/Pel for Dissection W and/or Wo Contrast Result Date: 05/08/2024 CLINICAL DATA:  Chest pain for several hours  EXAM: CT ANGIOGRAPHY CHEST, ABDOMEN AND PELVIS TECHNIQUE: Non-contrast CT of the chest was initially obtained. Multidetector CT imaging through the chest, abdomen and pelvis was performed using the standard protocol during bolus administration of intravenous contrast. Multiplanar reconstructed images and MIPs were obtained and reviewed to evaluate the vascular anatomy. RADIATION DOSE REDUCTION: This exam was performed according to the departmental dose-optimization program which includes automated exposure control, adjustment of the mA and/or kV according to patient size and/or use of iterative reconstruction technique. CONTRAST:  75mL OMNIPAQUE  IOHEXOL  350 MG/ML SOLN COMPARISON:  Plain film from earlier in the same day. FINDINGS: CTA CHEST FINDINGS Cardiovascular: Initial precontrast images show no hyperdense crescent to suggest acute aortic abnormality. Thoracic aorta shows a normal branching pattern. No aneurysmal dilatation or dissection is seen. No cardiac enlargement is noted. The pulmonary artery shows a normal branching pattern bilaterally. The degree of opacification is limited although no large central embolus is noted. Mediastinum/Nodes: Thoracic inlet is within normal limits. No hilar or mediastinal adenopathy is noted. The esophagus is within normal limits. Lungs/Pleura: The lungs are well aerated bilaterally. No focal infiltrate or sizable effusion is seen. Small 2 mm nodule is noted in the right middle lobe best seen on image number 84 of series 7. This is stable in appearance from 2022 and no further follow-up is  recommended. No other nodules are seen. Musculoskeletal: No acute bony abnormality is seen. Review of the MIP images confirms the above findings. CTA ABDOMEN AND PELVIS FINDINGS VASCULAR Aorta: Abdominal aorta demonstrates no significant atherosclerotic calcifications or aneurysmal dilatation. Celiac: Patent without evidence of aneurysm, dissection, vasculitis or significant stenosis. SMA: Patent without evidence of aneurysm, dissection, vasculitis or significant stenosis. Renals: Both renal arteries are patent without evidence of aneurysm, dissection, vasculitis, fibromuscular dysplasia or significant stenosis. IMA: Patent without evidence of aneurysm, dissection, vasculitis or significant stenosis. Inflow: Iliacs are within normal limits bilaterally. Veins: No specific venous abnormality is noted. Review of the MIP images confirms the above findings. NON-VASCULAR Hepatobiliary: No focal liver abnormality is seen. No gallstones, gallbladder wall thickening, or biliary dilatation. Pancreas: Unremarkable. No pancreatic ductal dilatation or surrounding inflammatory changes. Spleen: Normal in size without focal abnormality. Adrenals/Urinary Tract: Adrenal glands are within normal limits. Kidneys demonstrate a normal enhancement pattern. No renal calculi or obstructive changes are seen. The bladder is within normal limits. Stomach/Bowel: No obstructive or inflammatory changes of the colon are seen. Scattered diverticular changes noted without diverticulitis. The appendix is within normal limits. Small bowel and stomach are unremarkable. Lymphatic: No lymphadenopathy is noted. Reproductive: Prostate is unremarkable. Other: No abdominal wall hernia or abnormality. No abdominopelvic ascites. Musculoskeletal: No acute or significant osseous findings. Review of the MIP images confirms the above findings. IMPRESSION: No evidence of aortic abnormality in the chest or abdomen. No evidence of pulmonary emboli. 2 mm nodule in the  right middle lobe stable from a prior exam in 2022. No follow-up is recommended. Diverticulosis without diverticulitis. Electronically Signed   By: Oneil Devonshire M.D.   On: 05/08/2024 20:11   DG Chest Portable 1 View Result Date: 05/08/2024 CLINICAL DATA:  Chest pain and shortness of breath. EXAM: PORTABLE CHEST 1 VIEW COMPARISON:  Mar 16, 2024. FINDINGS: The heart size and mediastinal contours are within normal limits. Both lungs are clear. The visualized skeletal structures are unremarkable. IMPRESSION: No active disease. Electronically Signed   By: Lynwood Landy Raddle M.D.   On: 05/08/2024 15:56     .Critical Care  Performed by: Randol Simmonds, MD  Authorized by: Randol Simmonds, MD   Critical care provider statement:    Critical care time (minutes):  30   Critical care was time spent personally by me on the following activities:  Development of treatment plan with patient or surrogate, discussions with consultants, evaluation of patient's response to treatment, examination of patient, ordering and review of laboratory studies, ordering and review of radiographic studies, ordering and performing treatments and interventions, pulse oximetry, re-evaluation of patient's condition and review of old charts    Medications Ordered in the ED  nitroGLYCERIN (NITROSTAT) SL tablet 0.4 mg (has no administration in time range)  lactated ringers  bolus 1,000 mL (has no administration in time range)  morphine  (PF) 4 MG/ML injection 4 mg (4 mg Intravenous Given 05/08/24 1533)  morphine  (PF) 4 MG/ML injection 4 mg (4 mg Intravenous Given 05/08/24 1740)  iohexol  (OMNIPAQUE ) 350 MG/ML injection 75 mL (75 mLs Intravenous Contrast Given 05/08/24 1943)  LORazepam  (ATIVAN ) injection 1 mg (1 mg Intravenous Given 05/08/24 2111)  LORazepam  (ATIVAN ) injection 1 mg (1 mg Intravenous Given 05/08/24 2338)    Clinical Course as of 05/08/24 2358  Wed May 08, 2024  1602 Chest x-ray without acute finding [JK]  1719 Troponin I (High  Sensitivity) Initial troponin normal.  Patient still complaining of significant pain [JK]  1722 Patient still having pain.  Will proceed with CT angiogram concerning his persistent discomfort.  I will add on lipase. [JK]  2056 Troponin I (High Sensitivity) Delta troponin normal.  Lipase normal.  Bilirubin increased to 2.4 [JK]  2056 CBC(!) White blood cell count elevated 13.3 [JK]  2056 CT scan does not show any acute aortic abnormality in the chest or abdomen.  Patient does not have a known's of gallstones.  No signs of pancreatic inflammation.  There is no evidence of pulmonary embolism [JK]  2104 Pt eating and drinking.  Appears mildly tremulous.  Will give dose of ativan , check ruq us  with elevated bili [JK]  2257 Ultrasound without acute findings [JK]  2335 Patient noted to be persistently tachycardic.  Patient continues to have diaphoresis.  Concerned that he is having persistent alcohol withdrawal symptoms.  I will consult the medical service for admission [JK]    Clinical Course User Index [JK] Randol Simmonds, MD                                 Medical Decision Making Problems Addressed: AKI (acute kidney injury) [facility]): acute illness or injury that poses a threat to life or bodily functions Alcohol withdrawal syndrome without complication [facility]): acute illness or injury that poses a threat to life or bodily functions Chest pain, unspecified type: acute illness or injury that poses a threat to life or bodily functions  Amount and/or Complexity of Data Reviewed Labs: ordered. Decision-making details documented in ED Course. Radiology: ordered and independent interpretation performed.  Risk OTC drugs. Prescription drug management.   Patient presented with complaints of chest pain.  Patient felt like he had a pressure on his chest.  Consider the possibility acute coronary syndrome as well as aortic dissection.  Pancreatitis hepatitis also concerned.  ED workup did not show any signs of  acute dissection or other acute abnormality on CT scan.  Patient serial troponins were normal.  Patient was noted to have an elevated anion gap and bilirubin.  Ultrasound was performed there is no signs of acute cholecystitis.  Patient remained tachycardic as well  as diaphoretic and tremulous.  He was given IV Ativan .  Think the patient symptoms are likely related to possible gastritis and alcohol withdrawal syndrome.  I will consult the medical service for admission.      Final diagnoses:  Alcohol withdrawal syndrome without complication (HCC)  Chest pain, unspecified type  AKI (acute kidney injury) [facility])    ED Discharge Orders     None          Randol Simmonds, MD 05/08/24 2358

## 2024-05-09 DIAGNOSIS — F10939 Alcohol use, unspecified with withdrawal, unspecified: Secondary | ICD-10-CM | POA: Diagnosis present

## 2024-05-09 DIAGNOSIS — T675XXA Heat exhaustion, unspecified, initial encounter: Secondary | ICD-10-CM | POA: Diagnosis present

## 2024-05-09 DIAGNOSIS — E872 Acidosis, unspecified: Secondary | ICD-10-CM | POA: Diagnosis present

## 2024-05-09 DIAGNOSIS — D72829 Elevated white blood cell count, unspecified: Secondary | ICD-10-CM | POA: Diagnosis present

## 2024-05-09 DIAGNOSIS — I10 Essential (primary) hypertension: Secondary | ICD-10-CM

## 2024-05-09 DIAGNOSIS — T6701XA Heatstroke and sunstroke, initial encounter: Secondary | ICD-10-CM

## 2024-05-09 DIAGNOSIS — F1994 Other psychoactive substance use, unspecified with psychoactive substance-induced mood disorder: Secondary | ICD-10-CM | POA: Diagnosis present

## 2024-05-09 DIAGNOSIS — R059 Cough, unspecified: Secondary | ICD-10-CM | POA: Diagnosis present

## 2024-05-09 DIAGNOSIS — G4700 Insomnia, unspecified: Secondary | ICD-10-CM | POA: Diagnosis present

## 2024-05-09 DIAGNOSIS — N179 Acute kidney failure, unspecified: Secondary | ICD-10-CM

## 2024-05-09 DIAGNOSIS — R7989 Other specified abnormal findings of blood chemistry: Secondary | ICD-10-CM

## 2024-05-09 DIAGNOSIS — Z79899 Other long term (current) drug therapy: Secondary | ICD-10-CM | POA: Diagnosis not present

## 2024-05-09 DIAGNOSIS — X30XXXA Exposure to excessive natural heat, initial encounter: Secondary | ICD-10-CM | POA: Diagnosis not present

## 2024-05-09 DIAGNOSIS — Z720 Tobacco use: Secondary | ICD-10-CM | POA: Diagnosis not present

## 2024-05-09 DIAGNOSIS — F1020 Alcohol dependence, uncomplicated: Secondary | ICD-10-CM | POA: Diagnosis present

## 2024-05-09 DIAGNOSIS — F10930 Alcohol use, unspecified with withdrawal, uncomplicated: Secondary | ICD-10-CM

## 2024-05-09 DIAGNOSIS — E860 Dehydration: Secondary | ICD-10-CM | POA: Diagnosis present

## 2024-05-09 DIAGNOSIS — G621 Alcoholic polyneuropathy: Secondary | ICD-10-CM | POA: Diagnosis present

## 2024-05-09 DIAGNOSIS — R0602 Shortness of breath: Secondary | ICD-10-CM | POA: Diagnosis present

## 2024-05-09 DIAGNOSIS — R519 Headache, unspecified: Secondary | ICD-10-CM | POA: Diagnosis not present

## 2024-05-09 DIAGNOSIS — R251 Tremor, unspecified: Secondary | ICD-10-CM

## 2024-05-09 DIAGNOSIS — R079 Chest pain, unspecified: Secondary | ICD-10-CM | POA: Diagnosis present

## 2024-05-09 DIAGNOSIS — T673XXA Heat exhaustion, anhydrotic, initial encounter: Secondary | ICD-10-CM | POA: Diagnosis present

## 2024-05-09 DIAGNOSIS — G44209 Tension-type headache, unspecified, not intractable: Secondary | ICD-10-CM | POA: Diagnosis present

## 2024-05-09 LAB — CBC
HCT: 40.5 % (ref 39.0–52.0)
Hemoglobin: 13.5 g/dL (ref 13.0–17.0)
MCH: 28.4 pg (ref 26.0–34.0)
MCHC: 33.3 g/dL (ref 30.0–36.0)
MCV: 85.3 fL (ref 80.0–100.0)
Platelets: 260 10*3/uL (ref 150–400)
RBC: 4.75 MIL/uL (ref 4.22–5.81)
RDW: 14.2 % (ref 11.5–15.5)
WBC: 7.9 10*3/uL (ref 4.0–10.5)
nRBC: 0 % (ref 0.0–0.2)

## 2024-05-09 LAB — BASIC METABOLIC PANEL WITH GFR
Anion gap: 9 (ref 5–15)
BUN: 6 mg/dL (ref 6–20)
CO2: 21 mmol/L — ABNORMAL LOW (ref 22–32)
Calcium: 9.5 mg/dL (ref 8.9–10.3)
Chloride: 102 mmol/L (ref 98–111)
Creatinine, Ser: 0.74 mg/dL (ref 0.61–1.24)
GFR, Estimated: >60 mL/min (ref >60)
Glucose, Bld: 88 mg/dL (ref 70–99)
Potassium: 3.6 mmol/L (ref 3.5–5.1)
Sodium: 132 mmol/L — ABNORMAL LOW (ref 135–145)

## 2024-05-09 LAB — URINALYSIS, ROUTINE W REFLEX MICROSCOPIC
Bacteria, UA: NONE SEEN
Bilirubin Urine: NEGATIVE
Glucose, UA: NEGATIVE mg/dL
Ketones, ur: 80 mg/dL — AB
Leukocytes,Ua: NEGATIVE
Nitrite: NEGATIVE
Protein, ur: >=300 mg/dL — AB
Specific Gravity, Urine: 1.039 — ABNORMAL HIGH (ref 1.005–1.030)
pH: 5 (ref 5.0–8.0)

## 2024-05-09 LAB — BLOOD GAS, VENOUS
Acid-base deficit: 2.3 mmol/L — ABNORMAL HIGH (ref 0.0–2.0)
Bicarbonate: 21.2 mmol/L (ref 20.0–28.0)
O2 Saturation: 80.6 %
Patient temperature: 37
pCO2, Ven: 32 mmHg — ABNORMAL LOW (ref 44–60)
pH, Ven: 7.43 (ref 7.25–7.43)
pO2, Ven: 50 mmHg — ABNORMAL HIGH (ref 32–45)

## 2024-05-09 LAB — LACTIC ACID, PLASMA: Lactic Acid, Venous: 1.2 mmol/L (ref 0.5–1.9)

## 2024-05-09 LAB — ETHANOL: Alcohol, Ethyl (B): <15 mg/dL (ref <15)

## 2024-05-09 LAB — HEPATIC FUNCTION PANEL
ALT: 15 U/L (ref 0–44)
AST: 32 U/L (ref 15–41)
Albumin: 3.8 g/dL (ref 3.5–5.0)
Alkaline Phosphatase: 92 U/L (ref 38–126)
Bilirubin, Direct: 0.4 mg/dL — ABNORMAL HIGH (ref 0.0–0.2)
Indirect Bilirubin: 1.4 mg/dL — ABNORMAL HIGH (ref 0.3–0.9)
Total Bilirubin: 1.8 mg/dL — ABNORMAL HIGH (ref 0.0–1.2)
Total Protein: 7.2 g/dL (ref 6.5–8.1)

## 2024-05-09 MED ORDER — PROCHLORPERAZINE EDISYLATE 10 MG/2ML IJ SOLN
10.0000 mg | Freq: Once | INTRAMUSCULAR | Status: AC
  Administered 2024-05-09: 10 mg via INTRAVENOUS
  Filled 2024-05-09: qty 2

## 2024-05-09 MED ORDER — LACTATED RINGERS IV BOLUS
1000.0000 mL | Freq: Once | INTRAVENOUS | Status: AC
  Administered 2024-05-09: 1000 mL via INTRAVENOUS

## 2024-05-09 MED ORDER — ENOXAPARIN SODIUM 40 MG/0.4ML IJ SOSY
40.0000 mg | PREFILLED_SYRINGE | Freq: Every day | INTRAMUSCULAR | Status: DC
  Administered 2024-05-09 – 2024-05-10 (×2): 40 mg via SUBCUTANEOUS
  Filled 2024-05-09 (×3): qty 0.4

## 2024-05-09 MED ORDER — LOPERAMIDE HCL 2 MG PO CAPS: 2.0000 mg | ORAL_CAPSULE | ORAL | Status: DC | PRN

## 2024-05-09 MED ORDER — CHLORDIAZEPOXIDE HCL 25 MG PO CAPS: 25.0000 mg | ORAL_CAPSULE | ORAL | Status: DC

## 2024-05-09 MED ORDER — ONDANSETRON 4 MG PO TBDP
4.0000 mg | ORAL_TABLET | Freq: Four times a day (QID) | ORAL | Status: DC | PRN
  Administered 2024-05-09: 4 mg via ORAL
  Filled 2024-05-09: qty 1

## 2024-05-09 MED ORDER — CHLORDIAZEPOXIDE HCL 25 MG PO CAPS: 25.0000 mg | ORAL_CAPSULE | Freq: Four times a day (QID) | ORAL | Status: DC | PRN

## 2024-05-09 MED ORDER — THIAMINE HCL 100 MG/ML IJ SOLN
100.0000 mg | Freq: Once | INTRAMUSCULAR | Status: AC
  Administered 2024-05-09: 100 mg via INTRAMUSCULAR
  Filled 2024-05-09: qty 2

## 2024-05-09 MED ORDER — ADULT MULTIVITAMIN W/MINERALS CH
1.0000 | ORAL_TABLET | Freq: Every day | ORAL | Status: DC
  Administered 2024-05-09 – 2024-05-10 (×2): 1 via ORAL
  Filled 2024-05-09 (×2): qty 1

## 2024-05-09 MED ORDER — CHLORDIAZEPOXIDE HCL 25 MG PO CAPS
25.0000 mg | ORAL_CAPSULE | Freq: Three times a day (TID) | ORAL | Status: DC
  Filled 2024-05-09: qty 1

## 2024-05-09 MED ORDER — CHLORDIAZEPOXIDE HCL 25 MG PO CAPS
25.0000 mg | ORAL_CAPSULE | Freq: Every day | ORAL | Status: DC
Start: 2024-05-12 — End: 2024-05-09

## 2024-05-09 MED ORDER — PROPRANOLOL HCL 10 MG PO TABS
10.0000 mg | ORAL_TABLET | Freq: Two times a day (BID) | ORAL | Status: DC
  Administered 2024-05-10 (×2): 10 mg via ORAL
  Filled 2024-05-09 (×2): qty 1

## 2024-05-09 MED ORDER — CHLORDIAZEPOXIDE HCL 25 MG PO CAPS: 25.0000 mg | ORAL_CAPSULE | Freq: Three times a day (TID) | ORAL | Status: DC

## 2024-05-09 MED ORDER — HYDROXYZINE HCL 25 MG PO TABS: 25.0000 mg | ORAL_TABLET | Freq: Four times a day (QID) | ORAL | Status: DC | PRN

## 2024-05-09 NOTE — ED Notes (Signed)
 CCMD called to admit the patient

## 2024-05-09 NOTE — Hospital Course (Addendum)
 SABRA

## 2024-05-09 NOTE — Progress Notes (Addendum)
 HD#1 Subjective:  Xavier Owens is a 53 y.o. with a pertinent PMH of alcohol use disorder, hypertension who presented to the ED with chest pain and SOB, and admitted for ?alcohol withdrawal.  Per my evaluation today, patient symptoms more consistent with a heat stroke/exhaustion  Overnight Events: No acute event overnight.  Patient examined at bedside.  Prior to his symptom onset, he has been in the sun for about 4 hours.  Of note, patient is a walker.  He endorses nausea, vomiting and feeling fatigued he has a little congestion follow-up.SABRA He said his last drink was on Tuesday, denies any tremors.  Denies auditory or visual hallucination. Objective:  Vital signs in last 24 hours: Vitals:   05/10/24 0545 05/10/24 0600 05/10/24 0615 05/10/24 0617  BP:  112/87    Pulse: 86 72 77   Resp: 12 12 15    Temp:    98.8 F (37.1 C)  TempSrc:    Oral  SpO2: 97% 100% 100%   Weight:      Height:       Supplemental O2: Room Air SpO2: 100 % O2 Flow Rate (L/min): 2 L/min   Physical Exam:   Lying in bed in NAD. Heart regular rate and rhythm, no murmurs. Clear bilateral lungs. Skin warm and dry with goosebumps on bilateral arms and upper chest.  Filed Weights   05/08/24 1501  Weight: 72.6 kg    No intake or output data in the 24 hours ending 05/10/24 0654  Net IO Since Admission: 1,000 mL [05/10/24 0654]  Recent Labs    05/08/24 1537  GLUCAP 105*     Pertinent Labs:    Latest Ref Rng & Units 05/09/2024    9:41 AM 05/08/2024    3:25 PM 04/02/2024   11:04 PM  CBC  WBC 4.0 - 10.5 K/uL 7.9  13.3  6.3   Hemoglobin 13.0 - 17.0 g/dL 86.4  85.1  87.2   Hematocrit 39.0 - 52.0 % 40.5  46.3  39.1   Platelets 150 - 400 K/uL 260  335  448        Latest Ref Rng & Units 05/09/2024    5:34 AM 05/08/2024    3:25 PM 04/04/2024    6:29 PM  CMP  Glucose 70 - 99 mg/dL 88  99  890   BUN 6 - 20 mg/dL 6  8  6    Creatinine 0.61 - 1.24 mg/dL 9.25  8.68  9.25   Sodium 135 - 145 mmol/L  132  142  135   Potassium 3.5 - 5.1 mmol/L 3.6  4.2  3.8   Chloride 98 - 111 mmol/L 102  103  102   CO2 22 - 32 mmol/L 21  10  22    Calcium  8.9 - 10.3 mg/dL 9.5  89.6  9.1   Total Protein 6.5 - 8.1 g/dL 7.2  9.4    Total Bilirubin 0.0 - 1.2 mg/dL 1.8  2.4    Alkaline Phos 38 - 126 U/L 92  109    AST 15 - 41 U/L 32  35    ALT 0 - 44 U/L 15  15      Imaging: No results found.   Assessment/Plan:   Principal Problem:   Heat exhaustion, water deprivation Active Problems:   Dehydration   AKI (acute kidney injury) [facility])   Patient Summary: Xavier Owens is a 53 y.o. with a pertinent PMH of alcohol use disorder, hypertension who presented to the  ED with chest pain and SOB, and admitted for ?alcohol withdrawal.  Per evaluation today, patient symptoms more consistent with a heat stroke/exhaustion  #Heat Exhaustion #AKI #Leukocytosis Patient presented with shortness of breath,  after being in the sun> 4 hours.  Surprisingly his core temperature was normal on presentation.  He had an AKI that improved with IV fluids, and leukocytosis has resolved; likely reactive. On my exam today, his skin is pale and dry with piloerection.  - Will give additional dose of LR bolus. - PT treat and eval.  #Alcohol withdrawal #Alcohol use disorder #Elevated bilirubin I doubt patient is in alcohol withdrawal, as his CIWA score has been 0.  Vital signs stable. I have discontinued on the Librium  taper and PRN librium .  Serum ethanol level <15, consistent with no significant alcohol use in the periods leading to his hospitalization. -Continue with thiamine  and folate supplementation.  Headaches He is reporting bandlike headache, denies any nausea or vomiting.  Neuroexam unremarkable. -Tylenol  and Compazine  ordered.  #Anion gap metabolic acidosis Resolved, was likely in setting of alcoholic ketosis, which has improved with IV fluids.  Follow-up lactic acid was normal. - Will order additional 1 L  lactated Ringer 's, as he looks dehydrated on exam.   #Hypertension BP stable. - Monitor.   #Tremor - Restarted home dose of propranolol  10 mg for his tremors.  Diet: Regular diet IVF: PO intake VTE: enoxaparin  (LOVENOX ) injection 40 mg Start: 05/09/24 1000 Code: Full  ID:  Anti-infectives (From admission, onward)    None      Anticipated discharge to home tomorrow.  Trudy Mliss Dragon, MD 05/10/2024, 6:54 AM

## 2024-05-09 NOTE — H&P (Signed)
 Date: 05/09/2024               Patient Name:  Xavier Owens MRN: [id number]  DOB: [birth year] Age / Sex: 53 y.o., male   PCP: Gabino Boga, MD         Medical Service: Internal Medicine Teaching Service         Attending Physician: Dr. Trudy, Mliss Dragon, MD      First Contact: Dr. Missy Sandhoff, MD     Second Contact: Dr. Hadassah Kristy Ahr, MD          After Hours (After 5p/  First Contact Pager: [phone]  weekends / holidays): Second Contact Pager: [phone]   SUBJECTIVE   Chief Complaint: Chest pain and shortness of breath   History of Present Illness: This is a 53 year old male with a past medical history of alcohol use disorder, alcohol withdrawal with seizure, hypertension, substance-induced mood disorder who presented to the emergency room with concerns of sudden onset chest pain and shortness of breath.  He states he was outside walking and developed chest pain and shortness of breath.  The chest pain was sharp in nature.  It was intermittent in nature.  The shortness of breath was exertional, and resolved on rest.  At the time he denied any diaphoresis or lightheadedness.  Before all of this, the patient did not have any illnesses.  He denied any fever, chills, abdominal pain, nausea, vomiting, diarrhea, constipation, urinary changes, or bowel changes.  ED Course: On arrival to the emergency department, initial vital signs showed patient with pulse of 70, respiration 36, blood pressure 155/93, satting at 100% on room air.  Initial labs showing elevated white count at 13.3, bicarbonate 10, creatinine 1.3, bilirubin 2.4.  With concerns of chest pain and shortness of breath, patient was scanned for PE and dissection.  Imaging was negative.  Patient was going to be discharged, however developed tachycardia concerning for alcohol withdrawal symptoms. IMTS consulted for admission.   Past Medical History Past Medical History:  Diagnosis Date   Alcoholic (HCC)     Hypertension    Seizures (HCC)    Tremor      Meds:  Trazodone  100 mg nightly  Senna-docusate  Seroquel  100 mg nightly  Propanolol 10 mg BID Protonix  40 mg daily  Naltrexone  50 mg daily  Multivitamin  Vitamin D  5000 international units daily Voltaren  gel- not using  Cymbalta  60 mg daily  Ferrous sulfate  325 mg daily  Folic Acid  1 mg daily  Gabapentin  600 mg daily  Hydroxyzine  25 mg daily q6h prn   Past Surgical History  Past Surgical History:  Procedure Laterality Date   dislocated hip     HAND SURGERY     KNEE SURGERY      Social:  Lives With: Alone, in a house Occupation: Not currently working Support: Good support in his wife, however she is living separated from him Level of Function: Independent in all ADLs and IADLs PCP: Dr. Boga Sole, MD Substances: Alcohol use, reports drinking alcohol every weekend, drinks about a pint every weekend, previously drinking 1/5 of vodka daily for the past 30 years.  Daily tobacco user.  No other drugs.  Family History: No pertinent family history  Allergies: Allergies as of 05/08/2024   (No Known Allergies)    Review of Systems: A complete ROS was negative except as per HPI.   OBJECTIVE:   Physical Exam: Blood pressure (!) 139/92, pulse 98, temperature 98.4 F (36.9 C), temperature source Oral, resp.  rate 19, height 5' 8 (1.727 m), weight 72.6 kg, SpO2 99%.   Constitutional: well-appearing, lying in bed, in no acute distress, non-tremulous  Cardiovascular: regular rate and rhythm, no m/r/g Pulmonary/Chest: normal work of breathing on room air, lungs clear to auscultation bilaterally Abdominal: soft, non-tender, non-distended MSK: normal bulk and tone Neurological: alert & oriented x 3, no focal deficit Skin: warm, no diaphoresis  Psych: normal mood and behavior   Labs: CBC    Component Value Date/Time   WBC 13.3 (H) 05/08/2024 1525   RBC 5.23 05/08/2024 1525   HGB 14.8 05/08/2024 1525   HCT 46.3  05/08/2024 1525   PLT 335 05/08/2024 1525   MCV 88.5 05/08/2024 1525   MCH 28.3 05/08/2024 1525   MCHC 32.0 05/08/2024 1525   RDW 14.4 05/08/2024 1525   LYMPHSABS 1.6 04/02/2024 2304   MONOABS 0.7 04/02/2024 2304   EOSABS 0.1 04/02/2024 2304   BASOSABS 0.1 04/02/2024 2304     CMP     Component Value Date/Time   NA 142 05/08/2024 1525   NA 140 06/10/2022 1028   K 4.2 05/08/2024 1525   CL 103 05/08/2024 1525   CO2 10 (L) 05/08/2024 1525   GLUCOSE 99 05/08/2024 1525   BUN 8 05/08/2024 1525   BUN 7 06/10/2022 1028   CREATININE 1.31 (H) 05/08/2024 1525   CALCIUM  10.3 05/08/2024 1525   PROT 9.4 (H) 05/08/2024 1525   PROT 7.7 06/10/2022 1028   ALBUMIN 4.8 05/08/2024 1525   ALBUMIN 4.5 06/10/2022 1028   AST 35 05/08/2024 1525   ALT 15 05/08/2024 1525   ALKPHOS 109 05/08/2024 1525   BILITOT 2.4 (H) 05/08/2024 1525   BILITOT 0.3 06/10/2022 1028   GFRNONAA >60 05/08/2024 1525   GFRAA >60 04/13/2016 0839    Imaging:  CT angio chest/abdomen/pelvis for dissection: No evidence of aortic abnormality in the chest or abdomen.   No evidence of pulmonary emboli.   2 mm nodule in the right middle lobe stable from a prior exam in 2022. No follow-up is recommended.   Diverticulosis without diverticulitis.  Abdominal ultrasound RUQ:  Unremarkable right upper quadrant ultrasound.   EKG: personally reviewed my interpretation is normal sinus rhythm.  No obvious ST segment changes.  When compared to previous EKG, no changes appreciated.  ASSESSMENT & PLAN:   Assessment & Plan by Problem: Principal Problem:   Alcohol withdrawal (HCC) Active Problems:   Essential hypertension   Alcohol use disorder   Substance induced mood disorder (HCC)   Neuropathy   AKI (acute kidney injury) (HCC)   Leukocytosis   Xavier Owens is a 53 y.o. male with past medical history of alcohol use disorder, alcoholic withdrawal with seizures, presents for concerns of chest pain and shortness of  breath.  Patient found to be tachycardic and diaphoretic, concerning for alcohol withdrawal and patient admitted for further evaluation management.  #Alcohol withdrawal #Alcohol use disorder #Elevated bilirubin Patient initially presented with concern of chest pain and shortness of breath.  That has been resolved.  Dissection, PE, MI, esophageal rupture have all been ruled out.  Patient now tachycardic, with concern for alcohol withdrawal.  He normally drinks on the weekends, but before has been drinking 1 pint every day of vodka for the past 30 years.  He does report being adherent to his naltrexone .  Given that he has a history of alcohol withdrawal seizure in March 2025, we will go ahead and initiate Librium  taper.  Will also have as needed Librium .  No concern for alcoholic hepatitis as liver enzymes are normal.  Bilirubin is elevated, will obtain fractionated bilirubin. - Admit to progressive - Initiate Librium  taper - As needed Librium  - CIWA scoring - Folic acid  and thiamine  supplementation - Follow-up ethanol levels  #Anion gap metabolic acidosis Likely in the setting of alcoholic ketosis.  Urine showing evidence of ketones.  Will obtain lactic acid.  Will trend BMP to make sure that bicarb is not trending down.  Will obtain VBG as well. - Obtain VBG - Follow-up lactic acid - Fluid resuscitate  #AKI Elevated creatinine at 1.31.  This is likely prerenal in the setting of dehydration.  Will give IV fluids and recheck in the morning. - Recheck BMP - Lactated Ringer 's IV fluids  #Substance-induced mood disorder No acute concern for mood disturbance at this time.  Resume home meds - Resume home Cymbalta  60 mg daily - Resume home hydroxyzine  25 mg every 6 hours as needed - Resume home trazodone  100 mg nightly - Hold home Seroquel  100 mg nightly, given he does not take this nightly  #Hypertension Initial blood pressure was elevated at 181/87.  Currently at 134/89.  Patient is not on  any home antihypertensives.  Will monitor closely. - Monitor blood pressure closely as patient is actively going through alcohol withdrawal  #Insomnia Will resume home trazodone  - Resume home trazodone  100 mg nightly  #Tremor Patient does have a history of a tremor.  He is on propranolol  10 mg at home.  Will resume. -Resume home propranolol  10 mg twice daily  #Leukocytosis Likely reactive in the setting of acute illness. - Follow-up CBC  #Alcoholic neuropathy Can resume home Cymbalta  and gabapentin  - Resume home gabapentin  600 mg 3 times daily - Resume home Cymbalta  60 mg daily  Diet: Normal VTE: Enoxaparin  IVF: LR bolus Code: Full  Prior to Admission Living Arrangement: Home, living alone Anticipated Discharge Location: Home Barriers to Discharge: Clinical improvement  Dispo: Admit patient to Inpatient with expected length of stay greater than 2 midnights.  Signed: Norman Lobstein, DO Internal Medicine Resident PGY-1 05/09/2024, 1:08 AM   On weekends or after 5pm please page on call intern or resident: First contact: [phone] If no answer in 15 minutes, please contact senior pager at [phone]

## 2024-05-09 NOTE — ED Notes (Signed)
 Upon notifying the patient that he was up for discharge, patient reports headache and dizziness. Pt states he does not feel okay to be discharged at this time. MD notified of situation.

## 2024-05-09 NOTE — ED Notes (Signed)
 Walked patient to the bathroom patient did well patient is now back on the monitor sitting up eating breakfast with call bell in reach

## 2024-05-09 NOTE — ED Notes (Signed)
 Patient is back in the bed on the monitor with call bell in reach

## 2024-05-09 NOTE — ED Notes (Signed)
 Walked patient to the bathroom patient di well

## 2024-05-09 NOTE — Progress Notes (Signed)
 OT Screen Note  Patient Details Name: Xavier Owens MRN: [id number] DOB: 1971-09-14   Cancelled Treatment:    Reason Eval/Treat Not Completed: OT screened, no needs identified, will sign off (Discussed with PT, pt close to functional baseline with mobility and ADLs. No noteable cognitive deficits. OT signing off.)  05/09/2024  AB, OTR/L  Acute Rehabilitation Services  Office: [phone]   [name] 05/09/2024, 4:29 PM

## 2024-05-09 NOTE — Evaluation (Signed)
 Physical Therapy Evaluation Patient Details Name: Xavier Owens MRN: [id number] DOB: 30-May-1971 Today's Date: 05/09/2024  History of Present Illness  Patient is 53 y.o. male presents with sudden onset chest pain and shortness of breathe. PMH significant for HTN, ETOH abuse, seizures, mild cognitive deficits at baseline.  Clinical Impression  Pt Is currently presenting at a mod I level to ind for all functional activities. Pt did not require an AD. Currently pt is presenting at baseline level of functioning and no skilled physical therapy services recommended. Pt will be discharged from skilled physical therapy services at this time; please re-consult if further needs arise.                  Equipment Recommendations None recommended by PT     Functional Status Assessment Patient has had a recent decline in their functional status and demonstrates the ability to make significant improvements in function in a reasonable and predictable amount of time.     Precautions / Restrictions Precautions Precautions: Fall Recall of Precautions/Restrictions: Impaired Restrictions Weight Bearing Restrictions Per Provider Order: No      Mobility  Bed Mobility Overal bed mobility: Modified Independent    Transfers Overall transfer level: Modified independent Equipment used: None      Ambulation/Gait Ambulation/Gait assistance: Modified independent (Device/Increase time) Gait Distance (Feet): 30 Feet Assistive device: None Gait Pattern/deviations: Step-through pattern, Decreased stride length Gait velocity: slightly decreased Gait velocity interpretation: 1.31 - 2.62 ft/sec, indicative of limited community ambulator   General Gait Details: no LOB     Balance Overall balance assessment: Modified Independent         Pertinent Vitals/Pain Pain Assessment Pain Assessment: 0-10 Pain Score: 7  Pain Location: head Pain Descriptors / Indicators: Aching Pain Intervention(s):  Monitored during session, Limited activity within patient's tolerance, Patient requesting pain meds-RN notified    Home Living Family/patient expects to be discharged to:: Private residence Living Arrangements: Alone Available Help at Discharge: Family;Friend(s);Available PRN/intermittently Type of Home: House Home Access: Stairs to enter Entrance Stairs-Rails: None Entrance Stairs-Number of Steps: 2   Home Layout: One level Home Equipment: None 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    Prior Function Prior Level of Function : Independent/Modified Independent;History of Falls (last six months)             Mobility Comments: Ambulates without AD. Pt reports at least 1 falls in the last 63mo. ADLs Comments: Indep with ADLs/IADLs. Relies on his dad or a friend for transportation.     Extremity/Trunk Assessment   Upper Extremity Assessment Upper Extremity Assessment: Overall WFL for tasks assessed    Lower Extremity Assessment Lower Extremity Assessment: Overall WFL for tasks assessed    Cervical / Trunk Assessment Cervical / Trunk Assessment: Normal  Communication   Communication Communication: No apparent difficulties    Cognition Arousal: Alert Behavior During Therapy: WFL for tasks assessed/performed   PT - Cognitive impairments: No apparent impairments     Following commands: Intact       Cueing Cueing Techniques: Verbal cues     General Comments General comments (skin integrity, edema, etc.): No notable skin issues.        Assessment/Plan    PT Assessment Patient does not need any further PT services         PT Goals (Current goals can be found in the Care Plan section)  Acute Rehab PT Goals PT Goal Formulation:  All assessment and education complete, DC therapy            AM-PAC PT 6 Clicks Mobility  Outcome Measure Help needed turning from your back  to your side while in a flat bed without using bedrails?: None Help needed moving from lying on your back to sitting on the side of a flat bed without using bedrails?: None Help needed moving to and from a bed to a chair (including a wheelchair)?: None Help needed standing up from a chair using your arms (e.g., wheelchair or bedside chair)?: None Help needed to walk in hospital room?: None Help needed climbing 3-5 steps with a railing? : None 6 Click Score: 24    End of Session Equipment Utilized During Treatment: Gait belt Activity Tolerance: Patient tolerated treatment well Patient left: in bed Nurse Communication: Mobility status      Time: 1419-1430 PT Time Calculation (min) (ACUTE ONLY): 11 min   Charges:   PT Evaluation $PT Eval Low Complexity: 1 Low   PT General Charges $$ ACUTE PT VISIT: 1 Visit         Dorothyann Maier, DPT, CLT  Acute Rehabilitation Services Office: [phone] (Secure chat preferred)   Dorothyann VEAR Maier 05/09/2024, 3:53 PM

## 2024-05-10 ENCOUNTER — Other Ambulatory Visit (HOSPITAL_COMMUNITY): Payer: Self-pay

## 2024-05-10 ENCOUNTER — Encounter (HOSPITAL_COMMUNITY): Payer: Self-pay | Admitting: Internal Medicine

## 2024-05-10 ENCOUNTER — Encounter: Payer: MEDICAID | Admitting: Student

## 2024-05-10 MED ORDER — MENTHOL 3 MG MT LOZG: 1.0000 | LOZENGE | OROMUCOSAL | Status: DC | PRN

## 2024-05-10 MED ORDER — QUETIAPINE FUMARATE 25 MG PO TABS
100.0000 mg | ORAL_TABLET | Freq: Every day | ORAL | Status: DC
  Administered 2024-05-10: 100 mg via ORAL
  Filled 2024-05-10: qty 4

## 2024-05-10 MED ORDER — MENTHOL 3 MG MT LOZG
1.0000 | LOZENGE | OROMUCOSAL | 0 refills | Status: DC | PRN
  Filled 2024-05-10: qty 18, fill #0

## 2024-05-10 MED ORDER — TRAZODONE HCL 50 MG PO TABS
100.0000 mg | ORAL_TABLET | Freq: Every day | ORAL | Status: DC
  Administered 2024-05-10: 100 mg via ORAL
  Filled 2024-05-10: qty 2

## 2024-05-10 NOTE — ED Notes (Signed)
 2C charge notified

## 2024-05-10 NOTE — Discharge Summary (Addendum)
 Name: Xavier Owens MRN: [id number] DOB: 31-Jan-1971 53 y.o. PCP: Gabino Boga, MD  Date of Admission: 05/08/2024  2:44 PM Date of Discharge: 6/27/20258 Attending Physician: Dr. Trudy  Discharge Diagnosis: Principal Problem:   Heat exhaustion, water deprivation Active Problems:   Dehydration   AKI (acute kidney injury) [facility])    Discharge Medications: Allergies as of 05/10/2024   No Known Allergies      Medication List     TAKE these medications    Cholecalciferol  125 MCG (5000 UT) Tabs Take 1 tablet (5,000 Units total) by mouth daily.   diclofenac  Sodium 1 % Gel Commonly known as: VOLTAREN  Apply 2 g topically 4 (four) times daily. What changed:  when to take this reasons to take this   DULoxetine  60 MG capsule Commonly known as: CYMBALTA  Take 1 capsule (60 mg total) by mouth daily.   ferrous sulfate  325 (65 FE) MG tablet Take 1 tablet (325 mg total) by mouth at bedtime.   folic acid  1 MG tablet Commonly known as: FOLVITE  Take 1 tablet (1 mg total) by mouth daily.   gabapentin  300 MG capsule Commonly known as: NEURONTIN  Take 2 capsules (600 mg total) by mouth 3 (three) times daily.   hydrOXYzine  25 MG tablet Commonly known as: ATARAX  Take 1 tablet (25 mg total) by mouth every 6 (six) hours as needed for anxiety (or CIWA score </= 10).   menthol-cetylpyridinium 3 MG lozenge Commonly known as: CEPACOL Take 1 lozenge (3 mg total) by mouth as needed for sore throat.   multivitamin with minerals Tabs tablet Take 1 tablet by mouth daily.   naltrexone  50 MG tablet Commonly known as: DEPADE Take 1 tablet (50 mg total) by mouth daily.   pantoprazole  40 MG tablet Commonly known as: PROTONIX  Take 1 tablet (40 mg total) by mouth daily.   propranolol  10 MG tablet Commonly known as: INDERAL  Take 1 tablet (10 mg total) by mouth 2 (two) times daily.   QUEtiapine  100 MG tablet Commonly known as: SEROQUEL  Take 1 tablet (100 mg total) by mouth at  bedtime.   Senna-S 8.6-50 MG tablet Generic drug: senna-docusate Take 2 tablets by mouth 2 (two) times daily. What changed:  when to take this reasons to take this   traZODone  100 MG tablet Commonly known as: DESYREL  Take 1 tablet (100 mg total) by mouth at bedtime.        Disposition and follow-up:   [name] was discharged from [facility] in Fair condition.  At the hospital follow up visit please address:  1.  Follow-up:  a.  Heat exhaustion Resolved, back to baseline. - Confirm adequate hydration, and avoidance of extreme prolonged sun exposure.  B.  Dry cough On day of discharge, endorsing dry cough, with mild throat pain.  No URI symptoms.  Likely viral, discharged with lozenges for throat pain. -Please follow-up to ensure resolution of the cough.   2.  Labs / imaging needed at time of follow-up: None  3.  Pending labs/ test needing follow-up: None  Hospital Course by problem list: # Chest pain, SOB # Heat exhaustion # AKI # Leukocytosis Patient presented with chest pain and shortness of breath. Last alcohol intake was 48 hours prior, with no withdrawal symptoms. EKG and CT PE negative. History of heat exposure while walking outside for many hours during high outside temperatures, with minimal fluid intake, leading to dizziness, weakness, chest pain, nausea, confusion, headache, and shortness of breath. Leukocytosis was likely reactive and  has resolved.    #Anion gap metabolic acidosis Resolved, was likely in setting of alcoholic ketosis or dehydration, which has improved with IV fluids.    # Dry cough On day of discharge, endorsing dry cough, with sore throat.  Likely viral, discharged with lozenges tablet.  #Alcohol withdrawal #Alcohol use disorder #Elevated bilirubin Not in alcohol withdrawal, no tremors, CIWA score has been zero. Negative alcohol level. -Continued with thiamine  and folate supplementation.   #  Headaches Tension-like headache, treated with IV Compazine .   Discharge Subjective: Patient evaluated at bedside this AM.  Denies worsening headache.  Has a mild dry cough, started last night.  Denies URI symptoms.  Discharge Exam:   BP 101/75   Pulse 98   Temp 98.8 F (37.1 C) (Oral)   Resp 19   Ht 5' 8 (1.727 m)   Wt 72.6 kg   SpO2 98%   BMI 24.33 kg/m   Well-appearing, NAD Clear bilateral lungs. Regular rate and rhythm Mood and affect appropriate.  Pertinent Labs, Studies, and Procedures:     Latest Ref Rng & Units 05/09/2024    9:41 AM 05/08/2024    3:25 PM 04/02/2024   11:04 PM  CBC  WBC 4.0 - 10.5 K/uL 7.9  13.3  6.3   Hemoglobin 13.0 - 17.0 g/dL 86.4  85.1  87.2   Hematocrit 39.0 - 52.0 % 40.5  46.3  39.1   Platelets 150 - 400 K/uL 260  335  448        Latest Ref Rng & Units 05/09/2024    5:34 AM 05/08/2024    3:25 PM 04/04/2024    6:29 PM  CMP  Glucose 70 - 99 mg/dL 88  99  890   BUN 6 - 20 mg/dL 6  8  6    Creatinine 0.61 - 1.24 mg/dL 9.25  8.68  9.25   Sodium 135 - 145 mmol/L 132  142  135   Potassium 3.5 - 5.1 mmol/L 3.6  4.2  3.8   Chloride 98 - 111 mmol/L 102  103  102   CO2 22 - 32 mmol/L 21  10  22    Calcium  8.9 - 10.3 mg/dL 9.5  89.6  9.1   Total Protein 6.5 - 8.1 g/dL 7.2  9.4    Total Bilirubin 0.0 - 1.2 mg/dL 1.8  2.4    Alkaline Phos 38 - 126 U/L 92  109    AST 15 - 41 U/L 32  35    ALT 0 - 44 U/L 15  15      US  Abdomen Limited RUQ (LIVER/GB) Result Date: 05/08/2024 CLINICAL DATA:  Chest pain EXAM: ULTRASOUND ABDOMEN LIMITED RIGHT UPPER QUADRANT COMPARISON:  None Available. FINDINGS: Gallbladder: No gallstones or wall thickening visualized. No sonographic Murphy sign noted by sonographer. Common bile duct: Diameter: Normal caliber, 3 mm Liver: No focal lesion identified. Within normal limits in parenchymal echogenicity. Portal vein is patent on color Doppler imaging with normal direction of blood flow towards the liver. Other: None. IMPRESSION:  Unremarkable right upper quadrant ultrasound. Electronically Signed   By: Franky Crease M.D.   On: 05/08/2024 22:27   CT Angio Chest/Abd/Pel for Dissection W and/or Wo Contrast Result Date: 05/08/2024 CLINICAL DATA:  Chest pain for several hours EXAM: CT ANGIOGRAPHY CHEST, ABDOMEN AND PELVIS TECHNIQUE: Non-contrast CT of the chest was initially obtained. Multidetector CT imaging through the chest, abdomen and pelvis was performed using the standard protocol during bolus administration of intravenous contrast.  Multiplanar reconstructed images and MIPs were obtained and reviewed to evaluate the vascular anatomy. RADIATION DOSE REDUCTION: This exam was performed according to the departmental dose-optimization program which includes automated exposure control, adjustment of the mA and/or kV according to patient size and/or use of iterative reconstruction technique. CONTRAST:  75mL OMNIPAQUE  IOHEXOL  350 MG/ML SOLN COMPARISON:  Plain film from earlier in the same day. FINDINGS: CTA CHEST FINDINGS Cardiovascular: Initial precontrast images show no hyperdense crescent to suggest acute aortic abnormality. Thoracic aorta shows a normal branching pattern. No aneurysmal dilatation or dissection is seen. No cardiac enlargement is noted. The pulmonary artery shows a normal branching pattern bilaterally. The degree of opacification is limited although no large central embolus is noted. Mediastinum/Nodes: Thoracic inlet is within normal limits. No hilar or mediastinal adenopathy is noted. The esophagus is within normal limits. Lungs/Pleura: The lungs are well aerated bilaterally. No focal infiltrate or sizable effusion is seen. Small 2 mm nodule is noted in the right middle lobe best seen on image number 84 of series 7. This is stable in appearance from 2022 and no further follow-up is recommended. No other nodules are seen. Musculoskeletal: No acute bony abnormality is seen. Review of the MIP images confirms the above findings.  CTA ABDOMEN AND PELVIS FINDINGS VASCULAR Aorta: Abdominal aorta demonstrates no significant atherosclerotic calcifications or aneurysmal dilatation. Celiac: Patent without evidence of aneurysm, dissection, vasculitis or significant stenosis. SMA: Patent without evidence of aneurysm, dissection, vasculitis or significant stenosis. Renals: Both renal arteries are patent without evidence of aneurysm, dissection, vasculitis, fibromuscular dysplasia or significant stenosis. IMA: Patent without evidence of aneurysm, dissection, vasculitis or significant stenosis. Inflow: Iliacs are within normal limits bilaterally. Veins: No specific venous abnormality is noted. Review of the MIP images confirms the above findings. NON-VASCULAR Hepatobiliary: No focal liver abnormality is seen. No gallstones, gallbladder wall thickening, or biliary dilatation. Pancreas: Unremarkable. No pancreatic ductal dilatation or surrounding inflammatory changes. Spleen: Normal in size without focal abnormality. Adrenals/Urinary Tract: Adrenal glands are within normal limits. Kidneys demonstrate a normal enhancement pattern. No renal calculi or obstructive changes are seen. The bladder is within normal limits. Stomach/Bowel: No obstructive or inflammatory changes of the colon are seen. Scattered diverticular changes noted without diverticulitis. The appendix is within normal limits. Small bowel and stomach are unremarkable. Lymphatic: No lymphadenopathy is noted. Reproductive: Prostate is unremarkable. Other: No abdominal wall hernia or abnormality. No abdominopelvic ascites. Musculoskeletal: No acute or significant osseous findings. Review of the MIP images confirms the above findings. IMPRESSION: No evidence of aortic abnormality in the chest or abdomen. No evidence of pulmonary emboli. 2 mm nodule in the right middle lobe stable from a prior exam in 2022. No follow-up is recommended. Diverticulosis without diverticulitis. Electronically Signed    By: Oneil Devonshire M.D.   On: 05/08/2024 20:11   DG Chest Portable 1 View Result Date: 05/08/2024 CLINICAL DATA:  Chest pain and shortness of breath. EXAM: PORTABLE CHEST 1 VIEW COMPARISON:  Mar 16, 2024. FINDINGS: The heart size and mediastinal contours are within normal limits. Both lungs are clear. The visualized skeletal structures are unremarkable. IMPRESSION: No active disease. Electronically Signed   By: Lynwood Landy Raddle M.D.   On: 05/08/2024 15:56     Discharge Instructions: Discharge Instructions     Call MD for:  persistant dizziness or light-headedness   Complete by: As directed    Diet - low sodium heart healthy   Complete by: As directed    Diet - low sodium heart  healthy   Complete by: As directed    Increase activity slowly   Complete by: As directed        Signed: Celestina Czar, MD 05/10/2024, 12:02 PM   Pager: [phone]

## 2024-05-10 NOTE — Discharge Instructions (Addendum)
 You were hospitalized from extreme heat exhaustion. We gave you fluids, and you have recovered. - Please continue to stay hydrated.   - Avoid extensive hours of being in the sun.  - Please START taking:  Lozenges for your sore throat.  Please continue to take your medicines up as prescribed, no new changes.  If you have  new or worsening cough, or new fevers please go to the nearest ED.  Please call our clinic if you have any questions or concerns, we may be able to help and keep you from a long and expensive emergency room wait. Our clinic and after hours phone number is [phone], the best time to call is Monday through Friday 9 am to 4 pm but there is always someone available 24/7 if you have an emergency. If you need medication refills please notify your pharmacy one week in advance and they will send us  a request.   - Dr.Ronalda Walpole

## 2024-05-20 ENCOUNTER — Emergency Department (HOSPITAL_COMMUNITY): Payer: MEDICAID

## 2024-05-20 ENCOUNTER — Inpatient Hospital Stay (HOSPITAL_COMMUNITY)
Admission: EM | Admit: 2024-05-20 | Discharge: 2024-05-24 | DRG: 206 | Disposition: A | Discharge status: Home / Self Care | Payer: MEDICAID | Attending: Internal Medicine | Admitting: Internal Medicine

## 2024-05-20 ENCOUNTER — Encounter (HOSPITAL_COMMUNITY): Payer: Self-pay

## 2024-05-20 ENCOUNTER — Other Ambulatory Visit: Payer: Self-pay

## 2024-05-20 ENCOUNTER — Observation Stay (HOSPITAL_COMMUNITY): Payer: MEDICAID

## 2024-05-20 DIAGNOSIS — E872 Acidosis, unspecified: Secondary | ICD-10-CM | POA: Diagnosis present

## 2024-05-20 DIAGNOSIS — I959 Hypotension, unspecified: Secondary | ICD-10-CM | POA: Diagnosis present

## 2024-05-20 DIAGNOSIS — M940 Chondrocostal junction syndrome [Tietze]: Principal | ICD-10-CM | POA: Diagnosis present

## 2024-05-20 DIAGNOSIS — F1020 Alcohol dependence, uncomplicated: Secondary | ICD-10-CM | POA: Diagnosis present

## 2024-05-20 DIAGNOSIS — Z8249 Family history of ischemic heart disease and other diseases of the circulatory system: Secondary | ICD-10-CM

## 2024-05-20 DIAGNOSIS — E876 Hypokalemia: Secondary | ICD-10-CM | POA: Diagnosis present

## 2024-05-20 DIAGNOSIS — I10 Essential (primary) hypertension: Secondary | ICD-10-CM | POA: Diagnosis present

## 2024-05-20 DIAGNOSIS — Z712 Person consulting for explanation of examination or test findings: Secondary | ICD-10-CM

## 2024-05-20 DIAGNOSIS — N179 Acute kidney failure, unspecified: Secondary | ICD-10-CM | POA: Diagnosis present

## 2024-05-20 DIAGNOSIS — R0789 Other chest pain: Secondary | ICD-10-CM | POA: Diagnosis present

## 2024-05-20 DIAGNOSIS — K449 Diaphragmatic hernia without obstruction or gangrene: Secondary | ICD-10-CM | POA: Diagnosis present

## 2024-05-20 DIAGNOSIS — R079 Chest pain, unspecified: Secondary | ICD-10-CM | POA: Diagnosis not present

## 2024-05-20 DIAGNOSIS — F419 Anxiety disorder, unspecified: Secondary | ICD-10-CM | POA: Diagnosis present

## 2024-05-20 DIAGNOSIS — R7989 Other specified abnormal findings of blood chemistry: Secondary | ICD-10-CM

## 2024-05-20 DIAGNOSIS — E861 Hypovolemia: Secondary | ICD-10-CM | POA: Diagnosis present

## 2024-05-20 DIAGNOSIS — F339 Major depressive disorder, recurrent, unspecified: Secondary | ICD-10-CM | POA: Diagnosis present

## 2024-05-20 DIAGNOSIS — Z79899 Other long term (current) drug therapy: Secondary | ICD-10-CM

## 2024-05-20 DIAGNOSIS — R0781 Pleurodynia: Secondary | ICD-10-CM

## 2024-05-20 DIAGNOSIS — E873 Alkalosis: Secondary | ICD-10-CM | POA: Diagnosis present

## 2024-05-20 DIAGNOSIS — R1319 Other dysphagia: Secondary | ICD-10-CM

## 2024-05-20 DIAGNOSIS — E871 Hypo-osmolality and hyponatremia: Secondary | ICD-10-CM | POA: Diagnosis present

## 2024-05-20 DIAGNOSIS — D72829 Elevated white blood cell count, unspecified: Secondary | ICD-10-CM | POA: Diagnosis present

## 2024-05-20 DIAGNOSIS — R933 Abnormal findings on diagnostic imaging of other parts of digestive tract: Secondary | ICD-10-CM

## 2024-05-20 DIAGNOSIS — F17210 Nicotine dependence, cigarettes, uncomplicated: Secondary | ICD-10-CM | POA: Diagnosis present

## 2024-05-20 DIAGNOSIS — R634 Abnormal weight loss: Secondary | ICD-10-CM

## 2024-05-20 DIAGNOSIS — K222 Esophageal obstruction: Principal | ICD-10-CM | POA: Diagnosis present

## 2024-05-20 DIAGNOSIS — E878 Other disorders of electrolyte and fluid balance, not elsewhere classified: Secondary | ICD-10-CM

## 2024-05-20 DIAGNOSIS — G621 Alcoholic polyneuropathy: Secondary | ICD-10-CM | POA: Diagnosis present

## 2024-05-20 LAB — I-STAT VENOUS BLOOD GAS, ED
Acid-base deficit: 6 mmol/L — ABNORMAL HIGH (ref 0.0–2.0)
Bicarbonate: 15.3 mmol/L — ABNORMAL LOW (ref 20.0–28.0)
Calcium, Ion: 0.94 mmol/L — ABNORMAL LOW (ref 1.15–1.40)
HCT: 48 % (ref 39.0–52.0)
Hemoglobin: 16.3 g/dL (ref 13.0–17.0)
O2 Saturation: 95 %
Potassium: 5.8 mmol/L — ABNORMAL HIGH (ref 3.5–5.1)
Sodium: 128 mmol/L — ABNORMAL LOW (ref 135–145)
TCO2: 16 mmol/L — ABNORMAL LOW (ref 22–32)
pCO2, Ven: 22.4 mmHg — ABNORMAL LOW (ref 44–60)
pH, Ven: 7.443 — ABNORMAL HIGH (ref 7.25–7.43)
pO2, Ven: 71 mmHg — ABNORMAL HIGH (ref 32–45)

## 2024-05-20 LAB — COMPREHENSIVE METABOLIC PANEL WITH GFR
ALT: 21 U/L (ref 0–44)
AST: 52 U/L — ABNORMAL HIGH (ref 15–41)
Albumin: 3.8 g/dL (ref 3.5–5.0)
Alkaline Phosphatase: 97 U/L (ref 38–126)
Anion gap: 25 — ABNORMAL HIGH (ref 5–15)
BUN: 5 mg/dL — ABNORMAL LOW (ref 6–20)
CO2: 11 mmol/L — ABNORMAL LOW (ref 22–32)
Calcium: 9.5 mg/dL (ref 8.9–10.3)
Chloride: 99 mmol/L (ref 98–111)
Creatinine, Ser: 1.16 mg/dL (ref 0.61–1.24)
GFR, Estimated: >60 mL/min (ref >60)
Glucose, Bld: 87 mg/dL (ref 70–99)
Potassium: 3.3 mmol/L — ABNORMAL LOW (ref 3.5–5.1)
Sodium: 135 mmol/L (ref 135–145)
Total Bilirubin: 2.3 mg/dL — ABNORMAL HIGH (ref 0.0–1.2)
Total Protein: 7.9 g/dL (ref 6.5–8.1)

## 2024-05-20 LAB — URINALYSIS, ROUTINE W REFLEX MICROSCOPIC
Bilirubin Urine: NEGATIVE
Glucose, UA: NEGATIVE mg/dL
Ketones, ur: 80 mg/dL — AB
Leukocytes,Ua: NEGATIVE
Nitrite: NEGATIVE
Protein, ur: 100 mg/dL — AB
Specific Gravity, Urine: 1.026 (ref 1.005–1.030)
pH: 5 (ref 5.0–8.0)

## 2024-05-20 LAB — ECHOCARDIOGRAM COMPLETE
Area-P 1/2: 4.96 cm2
Height: 68 in
S' Lateral: 2.5 cm
Weight: 2528 [oz_av]

## 2024-05-20 LAB — HEMOGLOBIN A1C
Hgb A1c MFr Bld: 4.4 % — ABNORMAL LOW (ref 4.8–5.6)
Mean Plasma Glucose: 79.58 mg/dL

## 2024-05-20 LAB — CBC
HCT: 42.8 % (ref 39.0–52.0)
Hemoglobin: 13.8 g/dL (ref 13.0–17.0)
MCH: 28.1 pg (ref 26.0–34.0)
MCHC: 32.2 g/dL (ref 30.0–36.0)
MCV: 87.2 fL (ref 80.0–100.0)
Platelets: 353 K/uL (ref 150–400)
RBC: 4.91 MIL/uL (ref 4.22–5.81)
RDW: 14.2 % (ref 11.5–15.5)
WBC: 15.9 K/uL — ABNORMAL HIGH (ref 4.0–10.5)
nRBC: 0 % (ref 0.0–0.2)

## 2024-05-20 LAB — LACTIC ACID, PLASMA
Lactic Acid, Venous: 2.4 mmol/L (ref 0.5–1.9)
Lactic Acid, Venous: 2.4 mmol/L (ref 0.5–1.9)

## 2024-05-20 LAB — LIPID PANEL
Cholesterol: 208 mg/dL — ABNORMAL HIGH (ref 0–200)
HDL: 79 mg/dL (ref >40)
LDL Cholesterol: 95 mg/dL (ref 0–99)
Total CHOL/HDL Ratio: 2.6 ratio
Triglycerides: 168 mg/dL — ABNORMAL HIGH (ref <150)
VLDL: 34 mg/dL (ref 0–40)

## 2024-05-20 LAB — C-REACTIVE PROTEIN: CRP: 3.3 mg/dL — ABNORMAL HIGH (ref <1.0)

## 2024-05-20 LAB — TROPONIN I (HIGH SENSITIVITY)
Troponin I (High Sensitivity): 131 ng/L (ref <18)
Troponin I (High Sensitivity): 76 ng/L — ABNORMAL HIGH (ref <18)

## 2024-05-20 LAB — ETHANOL: Alcohol, Ethyl (B): <15 mg/dL (ref <15)

## 2024-05-20 LAB — SEDIMENTATION RATE: Sed Rate: 4 mm/h (ref 0–16)

## 2024-05-20 LAB — LIPASE, BLOOD: Lipase: 32 U/L (ref 11–51)

## 2024-05-20 LAB — OSMOLALITY: Osmolality: 279 mosm/kg (ref 275–295)

## 2024-05-20 MED ORDER — THIAMINE HCL 100 MG/ML IJ SOLN: 100.0000 mg | Freq: Every day | INTRAMUSCULAR | Status: DC

## 2024-05-20 MED ORDER — LORAZEPAM 1 MG PO TABS: 0.0000 mg | ORAL_TABLET | Freq: Two times a day (BID) | ORAL | Status: DC

## 2024-05-20 MED ORDER — DEXTROSE IN LACTATED RINGERS 5 % IV SOLN: INTRAVENOUS | Status: AC

## 2024-05-20 MED ORDER — HYDROXYZINE HCL 25 MG PO TABS: 25.0000 mg | ORAL_TABLET | Freq: Four times a day (QID) | ORAL | Status: DC | PRN

## 2024-05-20 MED ORDER — DULOXETINE HCL 60 MG PO CPEP
60.0000 mg | ORAL_CAPSULE | Freq: Every day | ORAL | Status: DC
  Administered 2024-05-21 – 2024-05-24 (×4): 60 mg via ORAL
  Filled 2024-05-20 (×4): qty 1

## 2024-05-20 MED ORDER — ENOXAPARIN SODIUM 40 MG/0.4ML IJ SOSY: 40.0000 mg | PREFILLED_SYRINGE | INTRAMUSCULAR | Status: DC

## 2024-05-20 MED ORDER — LORAZEPAM 2 MG/ML IJ SOLN: 0.0000 mg | Freq: Two times a day (BID) | INTRAMUSCULAR | Status: DC

## 2024-05-20 MED ORDER — LORAZEPAM 2 MG/ML IJ SOLN: 0.0000 mg | Freq: Four times a day (QID) | INTRAMUSCULAR | Status: DC

## 2024-05-20 MED ORDER — FOLIC ACID 1 MG PO TABS
1.0000 mg | ORAL_TABLET | Freq: Every day | ORAL | Status: DC
  Administered 2024-05-20 – 2024-05-24 (×5): 1 mg via ORAL
  Filled 2024-05-20 (×5): qty 1

## 2024-05-20 MED ORDER — DEXTROSE IN LACTATED RINGERS 5 % IV SOLN: INTRAVENOUS | Status: DC

## 2024-05-20 MED ORDER — TRAZODONE HCL 100 MG PO TABS
100.0000 mg | ORAL_TABLET | Freq: Every day | ORAL | Status: DC
  Administered 2024-05-20: 100 mg via ORAL
  Filled 2024-05-20: qty 1

## 2024-05-20 MED ORDER — LORAZEPAM 1 MG PO TABS: 0.0000 mg | ORAL_TABLET | Freq: Four times a day (QID) | ORAL | Status: DC

## 2024-05-20 MED ORDER — LORAZEPAM 1 MG PO TABS: 1.0000 mg | ORAL_TABLET | ORAL | Status: DC | PRN

## 2024-05-20 MED ORDER — QUETIAPINE FUMARATE 50 MG PO TABS
100.0000 mg | ORAL_TABLET | Freq: Every day | ORAL | Status: DC
  Administered 2024-05-20: 100 mg via ORAL
  Filled 2024-05-20: qty 2

## 2024-05-20 MED ORDER — ACETAMINOPHEN 500 MG PO TABS
1000.0000 mg | ORAL_TABLET | Freq: Four times a day (QID) | ORAL | Status: DC
  Administered 2024-05-20 – 2024-05-23 (×7): 1000 mg via ORAL
  Filled 2024-05-20 (×10): qty 2

## 2024-05-20 MED ORDER — THIAMINE MONONITRATE 100 MG PO TABS
100.0000 mg | ORAL_TABLET | Freq: Every day | ORAL | Status: DC
  Administered 2024-05-20 – 2024-05-24 (×5): 100 mg via ORAL
  Filled 2024-05-20 (×5): qty 1

## 2024-05-20 MED ORDER — LORAZEPAM 2 MG/ML IJ SOLN: 1.0000 mg | INTRAMUSCULAR | Status: DC | PRN

## 2024-05-20 MED ORDER — ONDANSETRON HCL 4 MG/2ML IJ SOLN
4.0000 mg | Freq: Once | INTRAMUSCULAR | Status: AC
  Administered 2024-05-20: 4 mg via INTRAVENOUS
  Filled 2024-05-20: qty 2

## 2024-05-20 MED ORDER — ENOXAPARIN SODIUM 40 MG/0.4ML IJ SOSY
40.0000 mg | PREFILLED_SYRINGE | INTRAMUSCULAR | Status: DC
  Administered 2024-05-20 – 2024-05-23 (×4): 40 mg via SUBCUTANEOUS
  Filled 2024-05-20 (×4): qty 0.4

## 2024-05-20 MED ORDER — ADULT MULTIVITAMIN W/MINERALS CH
1.0000 | ORAL_TABLET | Freq: Every day | ORAL | Status: DC
  Administered 2024-05-20 – 2024-05-24 (×5): 1 via ORAL
  Filled 2024-05-20 (×5): qty 1

## 2024-05-20 MED ORDER — PROPRANOLOL HCL 10 MG PO TABS
10.0000 mg | ORAL_TABLET | Freq: Two times a day (BID) | ORAL | Status: DC
  Administered 2024-05-20: 10 mg via ORAL
  Filled 2024-05-20 (×2): qty 1

## 2024-05-20 MED ORDER — GABAPENTIN 300 MG PO CAPS
600.0000 mg | ORAL_CAPSULE | Freq: Three times a day (TID) | ORAL | Status: DC
  Administered 2024-05-20 (×2): 600 mg via ORAL
  Filled 2024-05-20 (×3): qty 2

## 2024-05-20 MED ORDER — LACTATED RINGERS IV BOLUS
1000.0000 mL | Freq: Once | INTRAVENOUS | Status: AC
  Administered 2024-05-20: 1000 mL via INTRAVENOUS

## 2024-05-20 MED ORDER — PANTOPRAZOLE SODIUM 40 MG PO TBEC
40.0000 mg | DELAYED_RELEASE_TABLET | Freq: Every day | ORAL | Status: DC
  Administered 2024-05-21 – 2024-05-24 (×4): 40 mg via ORAL
  Filled 2024-05-20 (×4): qty 1

## 2024-05-20 MED ORDER — ATORVASTATIN CALCIUM 40 MG PO TABS
40.0000 mg | ORAL_TABLET | Freq: Every day | ORAL | Status: DC
  Administered 2024-05-20 – 2024-05-21 (×2): 40 mg via ORAL
  Filled 2024-05-20 (×2): qty 1

## 2024-05-20 MED ORDER — LIDOCAINE 5 % EX PTCH
1.0000 | MEDICATED_PATCH | CUTANEOUS | Status: DC
  Administered 2024-05-20 – 2024-05-21 (×2): 1 via TRANSDERMAL
  Filled 2024-05-20 (×3): qty 1

## 2024-05-20 NOTE — Consult Note (Signed)
 Cardiology Consultation   Patient ID: Xavier Owens MRN: [id number]; DOB: Nov 19, 1970  Admit date: 05/20/2024 Date of Consult: 05/20/2024  PCP:  [name], DO   [facility] Providers Cardiologist:  New to Dr Lonni    Patient Profile: [name] is a 53 y.o. male with a hx of alcohol use disorder, alcohol withdrawal with seizure, hypertension, depression, tobacco use,   who is being seen 05/20/2024 for the evaluation of chest pain at the request of Dr Francesco.  History of Present Illness: Mr. Mcentee with above PMH presented to ER today for c/o chest pain.He states he normally walks up to 2 miles daily and never have any chest pain. This helps him clear his mind and prevent him from drinking ETOH too much. He did not walk like usual for the past 3 days. He was sitting on the couch on Sat where he started having right sided chest pain. Pain was sharp in quality, worsens with breathing and coughing. Pain has been constant for the past 3 days. It was associated with SOB. He has not been eating or drinking much for the past 3 days due to lacking appetite. He states he was not able to keep water down when he drinks. He used to drink ETOH heavily, had head trauma and seizure associated with ETOH in the past. He has cut down the alcohol intake since the beginning of this year, currently drinks 1-2 liquor weekly. He takes propanolol for HTN. He denied any hx of MI, CVA, DM. He states he smokes 1 pack every 3-4 days. He states his father needed CABG at age of 48. He is currently having mild chest pain 2/10, when he takes a deep breath. He also c/o a headache that has been bothering him and felt he never has headache in the past. He states he had a cough during last admission, it resolved and now recurring. He denied any illicit drug use. He further denied any leg edema, orthopnea, PND.   He was recently hospitalized here from 05/09/24 to 05/10/24 for chest pain, SOB, dizziness,  weakness, nausea, that ultimately was felt due to heat exhaustion. He was walking in a 90 degree weather with minimal fluid intake prior to admission. He had Hs trop 6>7. EKG was negative for acute ischemic changes. He had AKI and anion gap metabolic acidosis on labs which both are resolved with IVF. He was not in alcohol withdrawal during his stay.   He has no prior cardiac conditions or workup per chart review.   Per ER workup today. CMP with K 3.3, bicarb 11, Cr 1.16 , anion gap 25, GFR >60. AST 52, ALT 21, TB 2.3. Lipase 32.  CBC with leukocytosis WBC 15900. Hs trop 131>76. VBG PH 7.443. CXR showed no acute finding. EKG showed sinus rhythm, old anterior Q, non-specific ST abnormalities of II. He was admitted to teaching service. Cardiology is consulted today for chest pain.      Past Medical History:  Diagnosis Date   Alcoholic (HCC)    Hypertension    Seizures (HCC)    Tremor     Past Surgical History:  Procedure Laterality Date   dislocated hip     HAND SURGERY     KNEE SURGERY       Home Medications:  Prior to Admission medications   Medication Sig Start Date End Date Taking? Authorizing Provider  Cholecalciferol  125 MCG (5000 UT) TABS Take 1 tablet (5,000 Units total) by mouth daily. 02/03/24  Kennyth Starleen RAMAN, MD  diclofenac  Sodium (VOLTAREN ) 1 % GEL Apply 2 g topically 4 (four) times daily. Patient taking differently: Apply 2 g topically 4 (four) times daily as needed (pain). 04/12/24   Kennyth Starleen RAMAN, MD  DULoxetine  (CYMBALTA ) 60 MG capsule Take 1 capsule (60 mg total) by mouth daily. 04/22/24   Harl Zane BRAVO, NP  ferrous sulfate  325 (65 FE) MG tablet Take 1 tablet (325 mg total) by mouth at bedtime. 04/12/24   Kennyth Starleen RAMAN, MD  folic acid  (FOLVITE ) 1 MG tablet Take 1 tablet (1 mg total) by mouth daily. 04/12/24   Kennyth Starleen RAMAN, MD  gabapentin  (NEURONTIN ) 300 MG capsule Take 2 capsules (600 mg total) by mouth 3 (three) times daily. 04/22/24   Harl Zane BRAVO, NP   hydrOXYzine  (ATARAX ) 25 MG tablet Take 1 tablet (25 mg total) by mouth every 6 (six) hours as needed for anxiety (or CIWA score </= 10). 04/22/24   Harl Zane BRAVO, NP  menthol -cetylpyridinium (CEPACOL) 3 MG lozenge Take 1 lozenge (3 mg total) by mouth as needed for sore throat. 05/10/24   Celestina Czar, MD  Multiple Vitamin (MULTIVITAMIN WITH MINERALS) TABS tablet Take 1 tablet by mouth daily. 02/07/24   Gabino Boga, MD  naltrexone  (DEPADE) 50 MG tablet Take 1 tablet (50 mg total) by mouth daily. 04/22/24   Harl Zane BRAVO, NP  pantoprazole  (PROTONIX ) 40 MG tablet Take 1 tablet (40 mg total) by mouth daily. 04/12/24   Kennyth Starleen RAMAN, MD  propranolol  (INDERAL ) 10 MG tablet Take 1 tablet (10 mg total) by mouth 2 (two) times daily. 04/22/24   Harl Zane BRAVO, NP  QUEtiapine  (SEROQUEL ) 100 MG tablet Take 1 tablet (100 mg total) by mouth at bedtime. 04/22/24   Harl Zane BRAVO, NP  senna-docusate (SENOKOT-S) 8.6-50 MG tablet Take 2 tablets by mouth 2 (two) times daily. Patient taking differently: Take 2 tablets by mouth 2 (two) times daily as needed for mild constipation. 03/22/24   Gabino Boga, MD  traZODone  (DESYREL ) 100 MG tablet Take 1 tablet (100 mg total) by mouth at bedtime. 04/22/24   Harl Zane BRAVO, NP    Scheduled Meds:  [START ON 05/21/2024] DULoxetine   60 mg Oral Daily   enoxaparin  (LOVENOX ) injection  40 mg Subcutaneous Q24H   gabapentin   600 mg Oral TID   LORazepam   0-4 mg Intravenous Q6H   Or   LORazepam   0-4 mg Oral Q6H   [START ON 05/22/2024] LORazepam   0-4 mg Intravenous Q12H   Or   [START ON 05/22/2024] LORazepam   0-4 mg Oral Q12H   [START ON 05/21/2024] pantoprazole   40 mg Oral Daily   propranolol   10 mg Oral BID   QUEtiapine   100 mg Oral QHS   thiamine   100 mg Oral Daily   Or   thiamine   100 mg Intravenous Daily   traZODone   100 mg Oral QHS   Continuous Infusions:  dextrose  5% lactated ringers  75 mL/hr at 05/20/24 1125   PRN  Meds: hydrOXYzine   Allergies:   No Known Allergies  Social History:   Social History   Socioeconomic History   Marital status: Married    Spouse name: Not on file   Number of children: Not on file   Years of education: Not on file   Highest education level: Not on file  Occupational History   Not on file  Tobacco Use   Smoking status: Every Day    Current packs/day: 0.25  Types: Cigarettes   Smokeless tobacco: Never  Vaping Use   Vaping status: Never Used  Substance and Sexual Activity   Alcohol use: Yes    Comment: a fifth of vodka every day for 30 years   Drug use: Not Currently    Types: Marijuana   Sexual activity: Yes  Other Topics Concern   Not on file  Social History Narrative   ** Merged History Encounter **       Social Drivers of Health   Financial Resource Strain: Not on file  Food Insecurity: No Food Insecurity (04/03/2024)   Hunger Vital Sign    Worried About Running Out of Food in the Last Year: Never true    Ran Out of Food in the Last Year: Never true  Transportation Needs: No Transportation Needs (04/03/2024)   PRAPARE - Administrator, Civil Service (Medical): No    Lack of Transportation (Non-Medical): No  Physical Activity: Not on file  Stress: Not on file  Social Connections: Socially Integrated (01/24/2024)   Social Connection and Isolation Panel    Frequency of Communication with Friends and Family: More than three times a week    Frequency of Social Gatherings with Friends and Family: More than three times a week    Attends Religious Services: More than 4 times per year    Active Member of [employer] or Organizations: Yes    Attends Banker Meetings: More than 4 times per year    Marital Status: Living with partner  Intimate Partner Violence: Not At Risk (04/03/2024)   Humiliation, Afraid, Rape, and Kick questionnaire    Fear of Current or Ex-Partner: No    Emotionally Abused: No    Physically Abused: No     Sexually Abused: No    Family History:    Family History  Problem Relation Age of Onset   Cancer Mother    Cancer Father      ROS:  Constitutional: Denied fever, chills, malaise, night sweats Eyes: Denied vision change or loss Ears/Nose/Mouth/Throat: Denied ear ache, sore throat, coughing, sinus pain Cardiovascular: see HPI  Respiratory: see HPI  Gastrointestinal:see HPI  Genital/Urinary: Denied dysuria, hematuria, urinary frequency/urgency Musculoskeletal: Denied muscle ache, joint pain, weakness Skin: Denied rash, wound Neuro: Denied headache, dizziness, syncope Psych: history of depression/anxiety, ETOH abuse  Endocrine: Denied history of diabetes   Physical Exam/Data: Vitals:   05/20/24 0900 05/20/24 0902 05/20/24 1006 05/20/24 1007  BP: (!) 137/94  122/71   Pulse: 72  69   Resp: (!) 24  20   Temp:  97.6 F (36.4 C) 97.8 F (36.6 C)   TempSrc:  Oral Oral   SpO2: 100%  100%   Weight:    71.7 kg  Height:    5' 8 (1.727 m)   No intake or output data in the 24 hours ending 05/20/24 1419    05/20/2024   10:07 AM 05/08/2024    3:01 PM 05/02/2024   10:22 AM  Last 3 Weights  Weight (lbs) 158 lb 160 lb 160 lb  Weight (kg) 71.668 kg 72.576 kg 72.576 kg     Body mass index is 24.02 kg/m.   Vitals:  Vitals:   05/20/24 0902 05/20/24 1006  BP:  122/71  Pulse:  69  Resp:  20  Temp: 97.6 F (36.4 C) 97.8 F (36.6 C)  SpO2:  100%   General Appearance: In no apparent distress, laying in bed HEENT: Normocephalic, atraumatic.  Neck: Supple, trachea  midline, no JVDs Cardiovascular: Regular rate and rhythm, normal S1-S2, no murmur  Respiratory: Resting breathing unlabored, lungs sounds clear to auscultation bilaterally, no use of accessory muscles. On room air.  No wheezes, rales or rhonchi.   Gastrointestinal: Bowel sounds positive, abdomen soft, non-tender, non-distended.  Extremities: Able to move all extremities in bed without difficulty, no edema of  BLE Musculoskeletal: Normal muscle bulk and tone Skin: Intact, warm, dry. No rashes or petechiae noted in exposed areas.  Neurologic: Alert, oriented to person, place and time.no cognitive deficit, no gross focal neuro deficit Psychiatric: Normal affect. Mood is appropriate.       EKG:  The EKG was personally reviewed and demonstrates:    EKG today showed sinus rhythm, old anterior Q, non-specific ST abnormalities of II  Telemetry:  Telemetry was personally reviewed and demonstrates:    Sinus rhythm, artifacts   Relevant CV Studies:  N/A in the past   Laboratory Data: High Sensitivity Troponin:   Recent Labs  Lab 05/08/24 1525 05/08/24 1915 05/20/24 0911 05/20/24 1205  TROPONINIHS 6 7 131* 76*     Chemistry Recent Labs  Lab 05/20/24 0911 05/20/24 1135  NA 135 128*  K 3.3* 5.8*  CL 99  --   CO2 11*  --   GLUCOSE 87  --   BUN 5*  --   CREATININE 1.16  --   CALCIUM  9.5  --   GFRNONAA >60  --   ANIONGAP 25*  --     Recent Labs  Lab 05/20/24 0911  PROT 7.9  ALBUMIN 3.8  AST 52*  ALT 21  ALKPHOS 97  BILITOT 2.3*   Lipids No results for input(s): CHOL, TRIG, HDL, LABVLDL, LDLCALC, CHOLHDL in the last 168 hours.  Hematology Recent Labs  Lab 05/20/24 0911 05/20/24 1135  WBC 15.9*  --   RBC 4.91  --   HGB 13.8 16.3  HCT 42.8 48.0  MCV 87.2  --   MCH 28.1  --   MCHC 32.2  --   RDW 14.2  --   PLT 353  --    Thyroid  No results for input(s): TSH, FREET4 in the last 168 hours.  BNPNo results for input(s): BNP, PROBNP in the last 168 hours.  DDimer No results for input(s): DDIMER in the last 168 hours.  Radiology/Studies:  DG Chest 2 View Result Date: 05/20/2024 CLINICAL DATA:  Chest pain. EXAM: CHEST - 2 VIEW COMPARISON:  05/08/2024. FINDINGS: Low lung volume. Bilateral lung fields are clear. Bilateral costophrenic angles are clear. Normal cardio-mediastinal silhouette. No acute osseous abnormalities. The soft tissues are within  normal limits. IMPRESSION: No active cardiopulmonary disease. Electronically Signed   By: Ree Molt M.D.   On: 05/20/2024 10:20     Assessment and Plan:  Chest pain, pleuritic  Elevated troponin  - presented with right sided resting chest pain started Sat, worsened with deep breathing and coughing, associated SOB, overall atypical  - Hs trop 131>76, flat  - EKG with non-specific findings - CXR no acute finding - Labs otherwise showed mild AKI, hypokalemia, anion gap metabolic acidosis,  mild transaminitis, and leukocytosis  - consider check UDS, correct metabolic derangement/AKI, and workup for pulmonary infection process given cough and leukocytosis (such as CT chest, procal, viral swab etc), will defer to medicine service   - will check Echo, lipid panel, hgb A1C for risk stratification, also check ESR and CRP  - Hs trop flat trend, unlikely ACS; he is a good candidate for  non-urgent coronary CT once metabolic derangement corrected   Mild AKI - Cr 1.16 today, Cr was 0.74 on 05/09/24  - currently getting IVF trial - will watch renal index before final decision on cath/CT if indicated   Leukocytosis  Metabolic acidosis   ETOH use Tobacco use Depression  - per primary team    Risk Assessment/Risk Scores:              For questions or updates, please contact  [facility] Please consult [email or web address] for contact info under    Signed, Sir Mallis, NP  05/20/2024 2:19 PM

## 2024-05-20 NOTE — ED Notes (Signed)
 Date and time results received: 05/20/24 1048 (use smartphrase .now to insert current time)  Test: tROP Critical Value: 131  Name of Provider Notified: Neysa

## 2024-05-20 NOTE — ED Provider Notes (Signed)
 [place] EMERGENCY DEPARTMENT AT [facility] Provider Note   CSN: [id number] Arrival date & time: 05/20/24  9143     Patient presents with: Chest Pain   Quest Tavenner is a 53 y.o. male.   This is a 79 male with alcohol abuse history presenting for chest pain x 2 days.  Having some shortness of breath as well.  Has been vomiting and having headache.  Reportedly has not had alcohol in the past couple days, mild withdrawal   Chest Pain      Prior to Admission medications   Medication Sig Start Date End Date Taking? Authorizing Provider  Cholecalciferol  (VITAMIN D -3 PO) Take 1 capsule by mouth daily.   Yes [provider]  diclofenac  Sodium (VOLTAREN ) 1 % GEL Apply 2 g topically 4 (four) times daily. Patient taking differently: Apply 2 g topically 4 (four) times daily as needed (pain). 04/12/24  Yes Kennyth Starleen RAMAN, MD  DULoxetine  (CYMBALTA ) 60 MG capsule Take 1 capsule (60 mg total) by mouth daily. 04/22/24  Yes Harl Regan E, NP  ferrous sulfate  325 (65 FE) MG tablet Take 1 tablet (325 mg total) by mouth at bedtime. 04/12/24  Yes Kennyth Starleen RAMAN, MD  folic acid  (FOLVITE ) 1 MG tablet Take 1 tablet (1 mg total) by mouth daily. 04/12/24  Yes Kennyth Starleen RAMAN, MD  gabapentin  (NEURONTIN ) 300 MG capsule Take 2 capsules (600 mg total) by mouth 3 (three) times daily. 04/22/24  Yes Harl Regan E, NP  hydrOXYzine  (ATARAX ) 25 MG tablet Take 1 tablet (25 mg total) by mouth every 6 (six) hours as needed for anxiety (or CIWA score </= 10). 04/22/24  Yes Harl Regan BRAVO, NP  Multiple Vitamins-Minerals (MULTIVITAMIN MEN) TABS Take 1 tablet by mouth daily.   Yes [provider]  naltrexone  (DEPADE) 50 MG tablet Take 1 tablet (50 mg total) by mouth daily. 04/22/24  Yes Harl Regan E, NP  pantoprazole  (PROTONIX ) 40 MG tablet Take 1 tablet (40 mg total) by mouth daily. 04/12/24  Yes Kennyth Starleen RAMAN, MD  propranolol  (INDERAL ) 10 MG tablet Take 1 tablet (10 mg  total) by mouth 2 (two) times daily. 04/22/24  Yes Harl Regan E, NP  QUEtiapine  (SEROQUEL ) 100 MG tablet Take 1 tablet (100 mg total) by mouth at bedtime. 04/22/24  Yes Harl Regan E, NP  thiamine  (VITAMIN B1) 100 MG tablet Take 100 mg by mouth daily.   Yes [provider]  traZODone  (DESYREL ) 100 MG tablet Take 1 tablet (100 mg total) by mouth at bedtime. 04/22/24  Yes Harl Regan E, NP  menthol -cetylpyridinium (CEPACOL) 3 MG lozenge Take 1 lozenge (3 mg total) by mouth as needed for sore throat. Patient not taking: Reported on 05/20/2024 05/10/24   Celestina Czar, MD  rivaroxaban  (XARELTO ) 10 MG TABS tablet Take 10 mg by mouth daily. Patient not taking: Reported on 05/20/2024    [provider]    Allergies: Patient has no known allergies.    Review of Systems  Cardiovascular:  Positive for chest pain.    Updated Vital Signs BP 122/71 (BP Location: Right Arm)   Pulse 69   Temp 97.9 F (36.6 C) (Oral)   Resp 20   Ht 5' 8 (1.727 m)   Wt 71.7 kg   SpO2 100%   BMI 24.02 kg/m   Physical Exam Vitals and nursing note reviewed.  Constitutional:      General: He is not in acute distress.    Appearance: He is  not toxic-appearing.  Cardiovascular:     Rate and Rhythm: Normal rate and regular rhythm.     Heart sounds: Normal heart sounds.  Pulmonary:     Effort: Pulmonary effort is normal.  Musculoskeletal:     Cervical back: Normal range of motion.  Neurological:     General: No focal deficit present.     Mental Status: He is alert.  Psychiatric:        Mood and Affect: Mood normal.        Behavior: Behavior normal.     (all labs ordered are listed, but only abnormal results are displayed) Labs Reviewed  CBC - Abnormal; Notable for the following components:      Result Value   WBC 15.9 (*)    All other components within normal limits  COMPREHENSIVE METABOLIC PANEL WITH GFR - Abnormal; Notable for the following components:   Potassium 3.3 (*)     CO2 11 (*)    BUN 5 (*)    AST 52 (*)    Total Bilirubin 2.3 (*)    Anion gap 25 (*)    All other components within normal limits  I-STAT VENOUS BLOOD GAS, ED - Abnormal; Notable for the following components:   pH, Ven 7.443 (*)    pCO2, Ven 22.4 (*)    pO2, Ven 71 (*)    Bicarbonate 15.3 (*)    TCO2 16 (*)    Acid-base deficit 6.0 (*)    Sodium 128 (*)    Potassium 5.8 (*)    Calcium , Ion 0.94 (*)    All other components within normal limits  TROPONIN I (HIGH SENSITIVITY) - Abnormal; Notable for the following components:   Troponin I (High Sensitivity) 131 (*)    All other components within normal limits  TROPONIN I (HIGH SENSITIVITY) - Abnormal; Notable for the following components:   Troponin I (High Sensitivity) 76 (*)    All other components within normal limits  LIPASE, BLOOD  URINALYSIS, ROUTINE W REFLEX MICROSCOPIC  OSMOLALITY  LIPID PANEL  HEMOGLOBIN A1C  C-REACTIVE PROTEIN  SEDIMENTATION RATE  ETHANOL  RAPID URINE DRUG SCREEN, HOSP PERFORMED    EKG: EKG Interpretation Date/Time:  Monday May 20 2024 08:59:15 EDT Ventricular Rate:  70 PR Interval:  171 QRS Duration:  105 QT Interval:  422 QTC Calculation: 456 R Axis:   9  Text Interpretation: Sinus rhythm Consider left atrial enlargement Probable anteroseptal infarct, old Confirmed by Neysa Clap [phone]) on 05/20/2024 9:13:35 AM  Radiology: ECHOCARDIOGRAM COMPLETE Result Date: 05/20/2024    ECHOCARDIOGRAM REPORT   Patient Name:   BRYSON GAVIA Date of Exam: 05/20/2024 Medical Rec #:  [id number]        Height:       68.0 in Accession #:    [id number]       Weight:       158.0 lb Date of Birth:  11-17-1970        BSA:          1.849 m Patient Age:    53 years         BP:           122/71 mmHg Patient Gender: M                HR:           79 bpm. Exam Location:  Inpatient Procedure: 2D Echo, Cardiac Doppler and Color Doppler (Both Spectral and Color  Flow Doppler were utilized during procedure).  Indications:    Chest Pain R07.9  History:        Patient has no prior history of Echocardiogram examinations.  Sonographer:    Tinnie Orion RDCS Referring Phys: [id number] XIKA ZHAO IMPRESSIONS  1. Left ventricular ejection fraction, by estimation, is 60 to 65%. The left ventricle has normal function. The left ventricle has no regional wall motion abnormalities. There is mild left ventricular hypertrophy. Left ventricular diastolic parameters were normal.  2. Right ventricular systolic function is normal. The right ventricular size is normal.  3. The mitral valve is normal in structure. No evidence of mitral valve regurgitation. No evidence of mitral stenosis.  4. The aortic valve was not well visualized. Aortic valve regurgitation is not visualized. No aortic stenosis is present.  5. The inferior vena cava is normal in size with greater than 50% respiratory variability, suggesting right atrial pressure of 3 mmHg. FINDINGS  Left Ventricle: Left ventricular ejection fraction, by estimation, is 60 to 65%. The left ventricle has normal function. The left ventricle has no regional wall motion abnormalities. Strain was performed and the global longitudinal strain is indeterminate. The left ventricular internal cavity size was normal in size. There is mild left ventricular hypertrophy. Left ventricular diastolic parameters were normal. Right Ventricle: The right ventricular size is normal. No increase in right ventricular wall thickness. Right ventricular systolic function is normal. Left Atrium: Left atrial size was normal in size. Right Atrium: Right atrial size was normal in size. Pericardium: There is no evidence of pericardial effusion. Mitral Valve: The mitral valve is normal in structure. No evidence of mitral valve regurgitation. No evidence of mitral valve stenosis. Tricuspid Valve: The tricuspid valve is normal in structure. Tricuspid valve regurgitation is not demonstrated. No evidence of tricuspid stenosis.  Aortic Valve: The aortic valve was not well visualized. Aortic valve regurgitation is not visualized. No aortic stenosis is present. Pulmonic Valve: The pulmonic valve was normal in structure. Pulmonic valve regurgitation is not visualized. No evidence of pulmonic stenosis. Aorta: The aortic root is normal in size and structure. Venous: The inferior vena cava is normal in size with greater than 50% respiratory variability, suggesting right atrial pressure of 3 mmHg. IAS/Shunts: No atrial level shunt detected by color flow Doppler. Additional Comments: 3D was performed not requiring image post processing on an independent workstation and was indeterminate.  LEFT VENTRICLE PLAX 2D LVIDd:         4.10 cm   Diastology LVIDs:         2.50 cm   LV e' medial:    6.96 cm/s LV PW:         1.30 cm   LV E/e' medial:  10.0 LV IVS:        1.20 cm   LV e' lateral:   10.90 cm/s LVOT diam:     2.10 cm   LV E/e' lateral: 6.4 LV SV:         63 LV SV Index:   34 LVOT Area:     3.46 cm  RIGHT VENTRICLE             IVC RV S prime:     12.50 cm/s  IVC diam: 0.90 cm TAPSE (M-mode): 2.6 cm LEFT ATRIUM           Index        RIGHT ATRIUM           Index LA diam:  3.10 cm 1.68 cm/m   RA Area:     10.90 cm LA Vol (A2C): 49.8 ml 26.93 ml/m  RA Volume:   20.20 ml  10.93 ml/m LA Vol (A4C): 18.0 ml 9.74 ml/m  AORTIC VALVE LVOT Vmax:   95.00 cm/s LVOT Vmean:  66.500 cm/s LVOT VTI:    0.183 m  AORTA Ao Root diam: 3.30 cm Ao Asc diam:  3.50 cm MITRAL VALVE MV Area (PHT): 4.96 cm    SHUNTS MV Decel Time: 153 msec    Systemic VTI:  0.18 m MV E velocity: 69.70 cm/s  Systemic Diam: 2.10 cm MV A velocity: 73.40 cm/s MV E/A ratio:  0.95 Maude Emmer MD Electronically signed by Maude Emmer MD Signature Date/Time: 05/20/2024/3:14:42 PM    Final    DG Chest 2 View Result Date: 05/20/2024 CLINICAL DATA:  Chest pain. EXAM: CHEST - 2 VIEW COMPARISON:  05/08/2024. FINDINGS: Low lung volume. Bilateral lung fields are clear. Bilateral costophrenic  angles are clear. Normal cardio-mediastinal silhouette. No acute osseous abnormalities. The soft tissues are within normal limits. IMPRESSION: No active cardiopulmonary disease. Electronically Signed   By: Ree Molt M.D.   On: 05/20/2024 10:20     Procedures   Medications Ordered in the ED  thiamine  (VITAMIN B1) tablet 100 mg (100 mg Oral Given 05/20/24 1020)    Or  thiamine  (VITAMIN B1) injection 100 mg ( Intravenous See Alternative 05/20/24 1020)  DULoxetine  (CYMBALTA ) DR capsule 60 mg (has no administration in time range)  gabapentin  (NEURONTIN ) capsule 600 mg (has no administration in time range)  hydrOXYzine  (ATARAX ) tablet 25 mg (has no administration in time range)  pantoprazole  (PROTONIX ) EC tablet 40 mg (has no administration in time range)  propranolol  (INDERAL ) tablet 10 mg (has no administration in time range)  QUEtiapine  (SEROQUEL ) tablet 100 mg (has no administration in time range)  traZODone  (DESYREL ) tablet 100 mg (has no administration in time range)  acetaminophen  (TYLENOL ) tablet 1,000 mg (has no administration in time range)  folic acid  (FOLVITE ) tablet 1 mg (has no administration in time range)  multivitamin with minerals tablet 1 tablet (has no administration in time range)  atorvastatin  (LIPITOR) tablet 40 mg (has no administration in time range)  dextrose  5 % in lactated ringers  infusion (has no administration in time range)  enoxaparin  (LOVENOX ) injection 40 mg (has no administration in time range)  LORazepam  (ATIVAN ) tablet 1-4 mg (has no administration in time range)    Or  LORazepam  (ATIVAN ) injection 1-4 mg (has no administration in time range)  ondansetron  (ZOFRAN ) injection 4 mg (4 mg Intravenous Given 05/20/24 1021)  lactated ringers  bolus 1,000 mL (1,000 mLs Intravenous New Bag/Given 05/20/24 1203)    Clinical Course as of 05/20/24 1556  Mon May 20, 2024  0902 CTA chest 05/08/24 per my review of chart : MPRESSION: No evidence of aortic abnormality in the  chest or abdomen.   No evidence of pulmonary emboli.   2 mm nodule in the right middle lobe stable from a prior exam in 2022. No follow-up is recommended.   Diverticulosis without diverticulitis.  [TY]    Clinical Course User Index [TY] Neysa Caron PARAS, DO                                 Medical Decision Making This is a 54 year old male presenting emergency department for chest pain.  EKG without STEMI on my independent interpretation.  Does have a elevated troponin of 151, down trended to 76.  Does not appear to have had much done in the way of provocative cardiac testing.  Case discussed with cardiology recommending medicine admission given his other metabolic derangements.  Does appear that he has anion gap which I suspect secondary to alcohol/starvation ketosis given his heavy alcohol abuse.  Per chart review does have history of similar labs.  I have given him some IV fluids.  Case discussed with hospitalist for admission.  Amount and/or Complexity of Data Reviewed External Data Reviewed: notes.    Details: Recent admission for similar ketosis Labs: ordered. Radiology: ordered and independent interpretation performed.    Details: Chest x-ray without pneumonia pneumothorax  Risk Prescription drug management. Decision regarding hospitalization. Diagnosis or treatment significantly limited by social determinants of health. Risk Details: Alcohol abuse history       Final diagnoses:  None    ED Discharge Orders     None          Neysa Caron PARAS, DO 05/20/24 1556

## 2024-05-20 NOTE — ED Triage Notes (Signed)
 Pt to er, pt states that he is here for chest pain, pt states that Saturday and Sunday he was having some shortness of breath and was having some vomiting.  Pt states that today he started seeing stars and having a headache.  Pt states that he is also having some chest pain that started yesterday.

## 2024-05-20 NOTE — Plan of Care (Signed)

## 2024-05-20 NOTE — Progress Notes (Signed)
  Echocardiogram 2D Echocardiogram has been performed.  Tinnie FORBES Gosling RDCS 05/20/2024, 3:13 PM

## 2024-05-20 NOTE — Discharge Instructions (Addendum)
 Thank you for allowing us  to be part of your care. You were hospitalized for chest pain. We treated you with tylenol , pantoprazole , and an EGD procedure.   See the changes in your medications and management of your chronic conditions below: *For your chest pain -We have STARTED you on these following medications:  -atorvastatin  40 mg daily  -lidocaine  5% patches: place one on skin daily  -We have CONTINUED the following medications:              -pantoprazole  40 mg daily  -We have CHANGED the following medications:              -diclofenac  sodium 1% gel: apply 2 g topically every 6 hours         *For your blood pressure  -We have CHANGED the following medications to these new doses:   - trazodone  50 mg daily at bedtime  - seroquel  50 mg daily at bedtime   -We have STOPPED the following medications:  -propanolol 10 mg  -gabapentin  300 mg  -Please see your PCP in 7 to 10 days  FOLLOW UP APPOINTMENTS: We arranged for you to follow up with your family doctor at: [facility] Please visit the [facility] on 05/28/2024 at 2:45 pm  Please make sure to to arrange a colonoscopy with [facility] at the phone number: [phone]  Please call your PCP or our clinic if you have any questions or concerns, we may be able to help and keep you from a long and expensive emergency room wait. Our clinic and after hours phone number is [phone]. The best time to call is Monday through Friday 9 am to 4 pm but there is always someone available 24/7 if you have an emergency. If you need medication refills please notify your pharmacy one week in advance and they will send us  a request.   We are glad you are feeling better,  Brad Prey Schuyler Novak, DO Internal Medicine Inpatient Teaching Service at [facility]                                     Outpatient Substance Abuse  Treatment- uninsured  Narcotics Anonymous 24-HOUR HELPLINE Pre-recorded for  Meeting Schedules [facility] [phone]  [email or web address] ALCOHOLICS ANONYMOUS  [place]   Answering Service [phone] Please Note: All [place] Meetings are Non-smoking [email or web address].es  Alcohol and Drug Services -  Insurance: Medicaid /State funding/private insurance Methadone, suboxone/Intensive outpatient  [place]   [phone] Fax: [phone] [address] [place], [place], [address] [place] [phone] Fax: [phone]    [address], [place], [place], [address] ([address] [place], [place], [place], [place], [place], [place], [place], [place]) [facility] [email or web address] Accepts State funding/Medicaid Transitional housing, Intensive Outpatient Treatment, Outpatient treatment, Veterans Services  Phone: [phone] Fax: [phone] Address: [address] [place] [address]   [employer] [facility] ([email or web address].info/) Insurance: Medicaid Case Management, Administrator, arts, Medication Management, Outpatient Therapy, Psychosocial Rehabilitation, Substance Abuse Intensive Outpatient  Phone: [phone] Fax: [phone] [address], [place], [place], [address]  [facility] Medicaid, most private insurance providers Types of Program: Individual/Group Therapy, Substance Abuse Treatment  Phone: [place] [phone] Fax: [phone] [address], [place], [place], [address] [place] [phone] [address] [place], [place], [address]  [facility]  Medicaid Types of Program: SAIOP  Phone: [phone] Fax: [phone] [address] [place], [place], [place], [address] [facility] Medicaid/state funds Crisis line [phone] [place] [address] [phone] [place] [phone] [address] [place] [phone]  [facility] [address];  [place], [place] [address] [phone]  Substance Abuse Intensive  Outpatient Program [facility] [address] [place], [place] [address] [phone]- Substance abuse treatment  Successful Transitions  Insurance: [facility], [address], sliding scale Types of Program: substance abuse treatment, transportation assistance Phone: [phone] Fax: [phone] Address: [address], [place] [address] [facility] ([email or web address]) Insurance: UHC, [place], [place], [place] Program: addiction counseling, detoxification,  Phone: [phone]  Fax: [phone] Address: [address] [place], [place] [address]  [facility] (statewide facilities/programs) [address] (Medicaid/state funds) [place], [place] [address]                      http://[email or web address]/ [phone] [place]- [phone] [place]- [phone] [facility] [place] (2 Locations) (Medicaid/state funds) --[address]  walk in 8:30-12 and 1-2:30 [place], [id number]   [facility]- [phone] --[address] [place], [place] [address]  [phone] walk in 8:30-12 and 2-3:30  [facility] state funds/medicaid [address] [place], [place] [address] [phone] [facility] (Suboxone clinic) Medicaid/state funds  [address]  [place], [place] [address] [phone]   [facility]  [address], [place], [place] [address]  [phone] (24 hours) [place]- [address] [place], [place] [address]  [phone] (24 hours) [place]- [address] [place] [phone] [place]- [address] [place] [phone] [place] [address] [place] [phone] [facility]- Medicaid and state funds  [place]- [address] [place], [place] [address] [phone] (24 hours) [place]- [address] [place], [place] [address] [phone] [facility]- [address] [place], [place] [address] [phone] (24 hours) [place] [name] [place],  [place]  [address] [phone] [place]- [address] [facility] [address] [place] [phone]

## 2024-05-20 NOTE — Progress Notes (Signed)
 CSW added substance abuse resources to patient's AVS.  Edwin Dada, MSW, LCSW Transitions of Care  Clinical Social Worker II [phone]

## 2024-05-20 NOTE — H&P (Cosign Needed Addendum)
 Date: 05/20/2024         Patient Name:  Xavier Owens MRN: [id number]  DOB: 15-May-1971 Age / Sex: 53 y.o., male   PCP: [name], DO         Medical Service: Internal Medicine Teaching Service         Attending Physician: Dr. Neysa Caron PARAS, DO    First Contact: Dr. Myrna Pager: [phone]  Second Contact: Dr. Elnora Pager: [phone]                 Chief Concern: Breathing trouble, chest hurts, feels like I finished running  History of Present Illness: 53 year old called 911 this morning for chest discomfort and breathing trouble that started Saturday 7/5 while he was sitting on couch at home, and steadily worsened until this morning when symptoms were severe enough that he sought care in emergency room. Discomfort is localized to right upper sternal border region. It is reproducible with palpation and deep breath. He had a similar problem a few weeks ago for which he was admitted to the hospital. Discharge diagnoses for that encounter included heat exhaustion after spending time outside without drinking enough water.  Denies history of heart attack, stroke, and blood clots. Medical history notable for alcohol use disorder with prior withdrawal seizures. He takes naltrexone . He takes gabapentin  for alcohol-related neuropathy. He has anxiety managed with hydroxyzine . He takes acid-suppressing medication for heartburn.  Family history notable for heart attack in father when he was in his 38s. Sounds like mother passed due to complications related to cancer.  Lives in [place]. Enjoys walking, fishing, working in his yard. He celebrated 4th of July with a tall mixed drink but hasn't had anything to drink since then and wasn't drinking heavily prior to this. Smokes 1 pack every 3-4 days. Denies cocaine, marijuana, other drug use.  In ED lab tests showed troponin 131 => 76. Cardiology was called and agreed to see in consult.   Review of Systems  Respiratory:  Negative  for cough and shortness of breath (resolved at time of interview).   Cardiovascular:  Positive for chest pain (ongoing discomfort). Negative for orthopnea and leg swelling.  Gastrointestinal:  Negative for abdominal pain and heartburn.  Neurological:  Positive for headaches.    Allergies: No Known Allergies  Past Medical History: Per above.  Medications: No current facility-administered medications on file prior to encounter.   Current Outpatient Medications on File Prior to Encounter  Medication Sig Dispense Refill   Cholecalciferol  125 MCG (5000 UT) TABS Take 1 tablet (5,000 Units total) by mouth daily. 90 tablet 0   diclofenac  Sodium (VOLTAREN ) 1 % GEL Apply 2 g topically 4 (four) times daily. (Patient taking differently: Apply 2 g topically 4 (four) times daily as needed (pain).)     DULoxetine  (CYMBALTA ) 60 MG capsule Take 1 capsule (60 mg total) by mouth daily. 30 capsule 3   ferrous sulfate  325 (65 FE) MG tablet Take 1 tablet (325 mg total) by mouth at bedtime. 30 tablet 0   folic acid  (FOLVITE ) 1 MG tablet Take 1 tablet (1 mg total) by mouth daily. 14 tablet 0   gabapentin  (NEURONTIN ) 300 MG capsule Take 2 capsules (600 mg total) by mouth 3 (three) times daily. 360 capsule 3   hydrOXYzine  (ATARAX ) 25 MG tablet Take 1 tablet (25 mg total) by mouth every 6 (six) hours as needed for anxiety (or CIWA score </= 10). 90 tablet 3   menthol -cetylpyridinium (CEPACOL)  3 MG lozenge Take 1 lozenge (3 mg total) by mouth as needed for sore throat. 18 tablet 0   Multiple Vitamin (MULTIVITAMIN WITH MINERALS) TABS tablet Take 1 tablet by mouth daily. 90 tablet 0   naltrexone  (DEPADE) 50 MG tablet Take 1 tablet (50 mg total) by mouth daily. 30 tablet 3   pantoprazole  (PROTONIX ) 40 MG tablet Take 1 tablet (40 mg total) by mouth daily. 30 tablet 0   propranolol  (INDERAL ) 10 MG tablet Take 1 tablet (10 mg total) by mouth 2 (two) times daily. 60 tablet 3   QUEtiapine  (SEROQUEL ) 100 MG tablet Take 1  tablet (100 mg total) by mouth at bedtime. 30 tablet 3   senna-docusate (SENOKOT-S) 8.6-50 MG tablet Take 2 tablets by mouth 2 (two) times daily. (Patient taking differently: Take 2 tablets by mouth 2 (two) times daily as needed for mild constipation.) 120 tablet 0   traZODone  (DESYREL ) 100 MG tablet Take 1 tablet (100 mg total) by mouth at bedtime. 30 tablet 3     Surgical History: No pertinent updates.  Family History:  Per above.  Social History:  Per above.  Physical Exam: Blood pressure 122/71, pulse 69, temperature 97.8 F (36.6 C), temperature source Oral, resp. rate 20, height 5' 8 (1.727 m), weight 71.7 kg, SpO2 100%.  Comfortable-appearing Oral mucosa pink and moist Heart rate and rhythm normal, no murmurs, radial and DP pulses normal bilaterally, no JVD Breathing comfortably on 1 L/min, maintained saturation 100% when oxygen discontinued, anterior lung fields clear Tenderness to right upper sternal border, right medial pectoral muscle on palpation No abdominal tenderness Skin warm and dry Right calf tenderness No lower extremity swelling or erythema Alert and oriented, no facial asymmetry, speech is normal, pupils equal and reactive, eye movements normal, symmetric palate elevation, tongue midline, normal finger-nose and heel-shin testing  EKG:  Sinus rhythm, normal axis, no intraventricular conduction delay, normal ST segments  Labs: Troponin 131 => 76 CO2 11 Anion gap 25 AST 52 T. Bili 2.3 VBG 7.4/22 LDL (03/2024) 74 A1c (12/2023) 4.5  Images and other studies: CXR stable from last  Assessment & Plan:  Xavier Owens is a 53 y.o. with history of alcohol use disorder admitted for chest discomfort and elevated troponin.  Principal Problem:   Chest discomfort Stable. Still with some discomfort at time of my exam but EKG looks okay and troponin dropping. Atypical for angina, no clear exertional component, reproducible with palpation and breathing.  Cardiology called from ED and they agreed to see in consult but didn't recommend starting anticoagulation. He was loaded with aspirin  324 mg in ambulance on his way here. I don't think this is acute coronary syndrome. Considered VTE given calf pain but CTA negative for this a couple of weeks ago, no other signs of PE, no right heart strain on echo. Pericarditis and costochondritis also on differential. -s/p aspirin  load -start atorvastatin  40 mg daily -start acetaminophen  1,000 mg q6 hours  Active Problems:   Alcohol use disorder, moderate, dependence (HCC) Chronic and stable. Last drink was an isolated drink a few days ago. Low risk for withdrawal at present. Monitor on CIWA with symptom triggered ativan . Continue outpatient thiamine  and folate. Holding outpatient naltrexone  but will continue at discharge. -regular CIWA checks -thiamine  100 mg daily -folic acid  1 mg daily -multivitamin    Major depression, recurrent (HCC) With comorbid anxiety. Chronic, may be factor in presentation today. Continue outpatient medications. -continue outpatient duloxetine  60 mg daily -outpatient hydroxyzine  25 mg q6  h prn for anxiety -outpatient trazodone  100 mg nightly -outpatient seroquel  100 mg nightly    Electrolyte abnormality Anion gap metabolic acidosis with respiratory alkalosis. He's not hyperventilating at present, respiratory alkalosis will resolve. AGMA on last admission, associated with heat exposure and dehydration. Denies heavy alcohol use of late but he may be using surreptitiously, check ethanol level and UDS. UA pending for look at urinary ketones. -increase D5 in LR rate from 75 => 125 mL/h through 2200 -a.m. BMP  Level of care: cardiac telemetry Diet: regular IVF: D5 in LR VTE: enoxaparin  (LOVENOX ) injection 40 mg Start: 05/20/24 1245 Code: full Surrogate: wife, [name] Pegram  Signed: Ozell Kung MD 05/20/2024, 11:43 AM Pager: [phone]

## 2024-05-20 NOTE — ED Notes (Signed)
Patient left the floor in stable condition with his belongings and staff.

## 2024-05-20 NOTE — ED Notes (Signed)
 Internal medicine at bedside

## 2024-05-21 ENCOUNTER — Observation Stay (HOSPITAL_COMMUNITY): Payer: MEDICAID

## 2024-05-21 DIAGNOSIS — F339 Major depressive disorder, recurrent, unspecified: Secondary | ICD-10-CM | POA: Diagnosis present

## 2024-05-21 DIAGNOSIS — R634 Abnormal weight loss: Secondary | ICD-10-CM | POA: Diagnosis not present

## 2024-05-21 DIAGNOSIS — N179 Acute kidney failure, unspecified: Secondary | ICD-10-CM | POA: Diagnosis present

## 2024-05-21 DIAGNOSIS — R0789 Other chest pain: Secondary | ICD-10-CM

## 2024-05-21 DIAGNOSIS — R1319 Other dysphagia: Secondary | ICD-10-CM | POA: Diagnosis not present

## 2024-05-21 DIAGNOSIS — F17210 Nicotine dependence, cigarettes, uncomplicated: Secondary | ICD-10-CM | POA: Diagnosis present

## 2024-05-21 DIAGNOSIS — M940 Chondrocostal junction syndrome [Tietze]: Secondary | ICD-10-CM | POA: Diagnosis present

## 2024-05-21 DIAGNOSIS — R079 Chest pain, unspecified: Secondary | ICD-10-CM | POA: Diagnosis not present

## 2024-05-21 DIAGNOSIS — I10 Essential (primary) hypertension: Secondary | ICD-10-CM | POA: Diagnosis present

## 2024-05-21 DIAGNOSIS — E876 Hypokalemia: Secondary | ICD-10-CM

## 2024-05-21 DIAGNOSIS — Z8249 Family history of ischemic heart disease and other diseases of the circulatory system: Secondary | ICD-10-CM | POA: Diagnosis not present

## 2024-05-21 DIAGNOSIS — K222 Esophageal obstruction: Secondary | ICD-10-CM | POA: Diagnosis present

## 2024-05-21 DIAGNOSIS — E873 Alkalosis: Secondary | ICD-10-CM | POA: Diagnosis present

## 2024-05-21 DIAGNOSIS — D72829 Elevated white blood cell count, unspecified: Secondary | ICD-10-CM | POA: Diagnosis present

## 2024-05-21 DIAGNOSIS — I959 Hypotension, unspecified: Secondary | ICD-10-CM | POA: Diagnosis present

## 2024-05-21 DIAGNOSIS — R7989 Other specified abnormal findings of blood chemistry: Secondary | ICD-10-CM | POA: Diagnosis not present

## 2024-05-21 DIAGNOSIS — G621 Alcoholic polyneuropathy: Secondary | ICD-10-CM | POA: Diagnosis present

## 2024-05-21 DIAGNOSIS — E861 Hypovolemia: Secondary | ICD-10-CM | POA: Diagnosis present

## 2024-05-21 DIAGNOSIS — F1020 Alcohol dependence, uncomplicated: Secondary | ICD-10-CM | POA: Diagnosis present

## 2024-05-21 DIAGNOSIS — E872 Acidosis, unspecified: Secondary | ICD-10-CM | POA: Diagnosis present

## 2024-05-21 DIAGNOSIS — R933 Abnormal findings on diagnostic imaging of other parts of digestive tract: Secondary | ICD-10-CM | POA: Diagnosis not present

## 2024-05-21 DIAGNOSIS — K449 Diaphragmatic hernia without obstruction or gangrene: Secondary | ICD-10-CM | POA: Diagnosis present

## 2024-05-21 DIAGNOSIS — E871 Hypo-osmolality and hyponatremia: Secondary | ICD-10-CM | POA: Diagnosis present

## 2024-05-21 DIAGNOSIS — R1310 Dysphagia, unspecified: Secondary | ICD-10-CM | POA: Diagnosis not present

## 2024-05-21 DIAGNOSIS — Z79899 Other long term (current) drug therapy: Secondary | ICD-10-CM | POA: Diagnosis not present

## 2024-05-21 DIAGNOSIS — Z712 Person consulting for explanation of examination or test findings: Secondary | ICD-10-CM | POA: Diagnosis not present

## 2024-05-21 DIAGNOSIS — F419 Anxiety disorder, unspecified: Secondary | ICD-10-CM | POA: Diagnosis present

## 2024-05-21 LAB — BASIC METABOLIC PANEL WITH GFR
Anion gap: 8 (ref 5–15)
BUN: 7 mg/dL (ref 6–20)
CO2: 23 mmol/L (ref 22–32)
Calcium: 9 mg/dL (ref 8.9–10.3)
Chloride: 103 mmol/L (ref 98–111)
Creatinine, Ser: 0.91 mg/dL (ref 0.61–1.24)
GFR, Estimated: >60 mL/min (ref >60)
Glucose, Bld: 116 mg/dL — ABNORMAL HIGH (ref 70–99)
Potassium: 3.1 mmol/L — ABNORMAL LOW (ref 3.5–5.1)
Sodium: 134 mmol/L — ABNORMAL LOW (ref 135–145)

## 2024-05-21 LAB — RAPID URINE DRUG SCREEN, HOSP PERFORMED
Amphetamines: NOT DETECTED
Barbiturates: NOT DETECTED
Benzodiazepines: NOT DETECTED
Cocaine: NOT DETECTED
Opiates: NOT DETECTED
Tetrahydrocannabinol: NOT DETECTED

## 2024-05-21 LAB — CBC
HCT: 37.9 % — ABNORMAL LOW (ref 39.0–52.0)
Hemoglobin: 12.9 g/dL — ABNORMAL LOW (ref 13.0–17.0)
MCH: 28.4 pg (ref 26.0–34.0)
MCHC: 34 g/dL (ref 30.0–36.0)
MCV: 83.5 fL (ref 80.0–100.0)
Platelets: 270 K/uL (ref 150–400)
RBC: 4.54 MIL/uL (ref 4.22–5.81)
RDW: 14.5 % (ref 11.5–15.5)
WBC: 6.2 K/uL (ref 4.0–10.5)
nRBC: 0 % (ref 0.0–0.2)

## 2024-05-21 LAB — LACTIC ACID, PLASMA: Lactic Acid, Venous: 1.6 mmol/L (ref 0.5–1.9)

## 2024-05-21 LAB — MAGNESIUM: Magnesium: 1.7 mg/dL (ref 1.7–2.4)

## 2024-05-21 MED ORDER — SODIUM CHLORIDE 0.9 % IV SOLN: INTRAVENOUS | Status: AC

## 2024-05-21 MED ORDER — METOPROLOL TARTRATE 5 MG/5ML IV SOLN
INTRAVENOUS | Status: AC
  Filled 2024-05-21: qty 5

## 2024-05-21 MED ORDER — SODIUM CHLORIDE 0.9 % IV BOLUS
1000.0000 mL | Freq: Once | INTRAVENOUS | Status: AC
  Administered 2024-05-21: 1000 mL via INTRAVENOUS

## 2024-05-21 MED ORDER — NITROGLYCERIN 0.4 MG SL SUBL
0.8000 mg | SUBLINGUAL_TABLET | Freq: Once | SUBLINGUAL | Status: AC
  Administered 2024-05-21: 0.8 mg via SUBLINGUAL

## 2024-05-21 MED ORDER — QUETIAPINE FUMARATE 50 MG PO TABS
50.0000 mg | ORAL_TABLET | Freq: Once | ORAL | Status: AC
  Administered 2024-05-21: 50 mg via ORAL
  Filled 2024-05-21: qty 1

## 2024-05-21 MED ORDER — DEXTROSE IN LACTATED RINGERS 5 % IV SOLN: INTRAVENOUS | Status: DC

## 2024-05-21 MED ORDER — NITROGLYCERIN 0.4 MG SL SUBL
SUBLINGUAL_TABLET | SUBLINGUAL | Status: AC
  Filled 2024-05-21: qty 2

## 2024-05-21 MED ORDER — POTASSIUM CHLORIDE CRYS ER 20 MEQ PO TBCR
40.0000 meq | EXTENDED_RELEASE_TABLET | Freq: Once | ORAL | Status: AC
  Administered 2024-05-21: 40 meq via ORAL
  Filled 2024-05-21: qty 2

## 2024-05-21 MED ORDER — IOHEXOL 350 MG/ML SOLN
100.0000 mL | Freq: Once | INTRAVENOUS | Status: AC | PRN
  Administered 2024-05-21: 100 mL via INTRAVENOUS

## 2024-05-21 MED ORDER — TRAZODONE HCL 50 MG PO TABS
50.0000 mg | ORAL_TABLET | Freq: Once | ORAL | Status: AC
  Administered 2024-05-21: 50 mg via ORAL
  Filled 2024-05-21: qty 1

## 2024-05-21 MED ORDER — METOPROLOL TARTRATE 5 MG/5ML IV SOLN
2.5000 mg | Freq: Once | INTRAVENOUS | Status: AC
  Administered 2024-05-21: 2.5 mg via INTRAVENOUS

## 2024-05-21 MED ORDER — LACTATED RINGERS IV BOLUS
1000.0000 mL | Freq: Once | INTRAVENOUS | Status: AC
  Administered 2024-05-21: 1000 mL via INTRAVENOUS

## 2024-05-21 NOTE — Progress Notes (Signed)
 Repeat lactic acid was 2.4 D5LR @125ml /hr was initiated.

## 2024-05-21 NOTE — Progress Notes (Signed)
 Day team had d/c'd patient's nighttime sleep medications due to sustained hypotension last night. Nursing messaged night team about patient asking for his sleep medications, otherwise he won't be able to sleep at all tonight. Went to discuss with patient the risks of taking his usual medications given his hypotension and not wanting to make him worse. Patient states he has been stable on his Trazodone  and Seroquel  for 2 years, without any changes in his dose at least for the past year. He thinks his hypotensive episode last night is because he was given his propanolol, gabapentin , seroquel , and trazodone  all at once when he usually only takes his seroquel  and trazodone  at night to sleep. States he does not have hypotensive episodes at home. We discussed giving him a half dose of his nighttime regimen to help him sleep but also be cautious of potentially lowering of his blood pressure, to which the patient is amenable. He states he likely will not be able to sleep but will try this plan to be safe. When prompted about other sleep alternatives, he states melatonin does not help him and he has tried it in the past. Will give Seroquel  50mg  and trazodone  50mg . Will monitor his BP. If he still cannot sleep but he remains normotensive, will consider adding another 50mg  of either Seroquel  or Trazodone , but not both. If patient's clinical course changes, please page for further evaluation.   Nishan Ovens, DO Internal Medicine Resident, PGY-1 Please contact the on call pager at [phone] for any urgent or emergent needs. 9:53 PM 05/21/2024

## 2024-05-21 NOTE — Progress Notes (Addendum)
  Progress Note  Patient Name: Xavier Owens Date of Encounter: 05/21/2024 [facility] Cardiologist: Shelda Bruckner, MD   Interval Summary   Continues to have right sided chest pain. Pain is worse with inspiration and movement. Blood pressures have been hypotensive over night. Most recent BP 78/55. Denies lightheadedness but has not stood up. Feels like may become lightheaded after standing up. Feels significantly weaker than baseline. Able to do more than 4  metabolic equivalence of exertion.  Denies SOB, nausea, vomiting, fever, chills, orthopnea, and lower extremity edema.  Vital Signs Vitals:   05/21/24 0136 05/21/24 0249 05/21/24 0424 05/21/24 0803  BP: (!) 83/58 (!) 81/60 (!) 76/57 (!) 78/55  Pulse: 96 88 93 68  Resp: 18  16 18   Temp: 97.8 F (36.6 C)  98.6 F (37 C) 97.8 F (36.6 C)  TempSrc: Oral  Oral Oral  SpO2: 93% 94% 93% 98%  Weight:      Height:        Intake/Output Summary (Last 24 hours) at 05/21/2024 0817 Last data filed at 05/21/2024 0810 Gross per 24 hour  Intake 360 ml  Output 750 ml  Net -390 ml      05/20/2024   10:07 AM 05/08/2024    3:01 PM 05/02/2024   10:22 AM  Last 3 Weights  Weight (lbs) 158 lb 160 lb 160 lb  Weight (kg) 71.668 kg 72.576 kg 72.576 kg      Telemetry/ECG  Normal sinus rhythm 60-80's  - Personally Reviewed  Physical Exam  GEN: No acute distress.   Neck: No JVD Cardiac: RRR, no murmurs, rubs, or gallops. Chest tender to palpation. Respiratory: Clear to auscultation bilaterally. GI: Soft, nontender, non-distended  MS: No edema  Assessment & Plan  Xavier Owens is a 53 y.o. male with a hx of alcohol use disorder, alcohol withdrawal with seizure, hypertension, depression, tobacco use,   who is being seen 05/20/2024 for the evaluation of chest pain.  Chest pain, pleuritic  Elevated troponin 131>76 presented with right sided resting chest pain started Sat, worsened with deep breathing and coughing, also has  shortness of breath, overall atypical chest pain. Hs trop 131>76. EKG with non-specific findings, CXR no acute finding. Pulmonary CTA negative for PE and dissection. Echo this hospitalization showed normal LVEF 60-65%, No RWMA, mild LVH, normal RV function, normal valve function.  Labs otherwise showed mild AKI, hypokalemia, anion gap metabolic acidosis,  mild transaminitis, and leukocytosis. LDL 95, ESR normal at 4, CRP elevated at 3.3. - Creatinine improved this morning. Plan for Cardiac CT today depending on BP. If gets cardiac CT needs to be NPO for 1 hour prior.   Mild AKI resolved Cr worsened after Pulmonary CTA (1.16). Got IV fluids. Today Cr improved to 0.91. plan to proceed with Cardiac CT as above.  Hypotension BP has been hypotensive overnight. Most recent BP 92/69. Improving slightly. This may limit BB rate control for cardiac CT. Will recheck BP  Otherwise managed per primary    For questions or updates, please contact [facility] Please consult [email or web address] for contact info under       Signed, Tayton Decaire, [name]

## 2024-05-21 NOTE — Progress Notes (Signed)
 Dr. Isobel was made aware that Pt's manual BP is 84/58. Lab is on the unit and will be drawn his blood soon. Pt has not voided since the beginning of the shift. D5LR 1L @ 226ml/hr was initiatied.

## 2024-05-21 NOTE — Plan of Care (Signed)
?  Problem: Education: ?Goal: Knowledge of General Education information will improve ?Description: Including pain rating scale, medication(s)/side effects and non-pharmacologic comfort measures ?Outcome: Progressing ?  ?Problem: Clinical Measurements: ?Goal: Ability to maintain clinical measurements within normal limits will improve ?Outcome: Progressing ?Goal: Respiratory complications will improve ?Outcome: Progressing ?  ?Problem: Nutrition: ?Goal: Adequate nutrition will be maintained ?Outcome: Progressing ?  ?

## 2024-05-21 NOTE — Progress Notes (Addendum)
 Dr. Isobel was made aware that the pt c/o 9/10 headache, and tylenol  is not due until 2200hr. He also has 9/10 tightness in his chest on inspiration, similar pain as admission. VSS. Afebrile. Denied SOB. Skin warm and dry. Please advise.  Pt was given tylenol  650mg  PO for headache and lidocaine  patch for chest pain. Pt headache and CP were relieved.

## 2024-05-21 NOTE — Progress Notes (Signed)
 Dr. Isobel was made aware that lactic acid was 2.4. Repeat lactic acid was ordered.

## 2024-05-21 NOTE — Progress Notes (Signed)
 About an hour later,Dr. Isobel was made aware that BP=81/60. hr=90. Pt is still sleeping and has no complaints.

## 2024-05-21 NOTE — Progress Notes (Signed)
 HD#0 SUBJECTIVE:  Patient Summary: Xavier Owens is a 53 y.o. with a pertinent PMH of alcohol use, who presented with chest discomfort and admitted for chest discomfort and elevated troponin. He had a previous admission for similar symptoms as a result of dehydration and being outside which resulted in a AGMA. He had this chest discomfort starting 7/5 and it has continued since then. On 7/4, he described drinking a large cocktail and/or a handle of liquor.   Overnight Events: Cardiology was consulted yesterday for elevated troponin. Observed no significant coronary calcification on review of recent CT angiogram. Suggested further evaluation for coronary disease with coronary CT.   Interim History: Patient continues to have chest discomfort, pain with respiration, and diffuse tenderness to palpation of the chest area. He has been trying to continue adequate oral hydration. He denies nausea, vomiting, diarrhea. He endorses increasing weakness. He received LR bolus overnight and this morning for persistent hypotension with SBP ranging 58-82 and DBP ranging 38-68. BP improving with most recent measurements at 0819 and 1020 at 92/68 and 104/68, respectively.  OBJECTIVE:  Vital Signs: Vitals:   05/21/24 0424 05/21/24 0803 05/21/24 0819 05/21/24 1020  BP: (!) 76/57 (!) 78/55 92/68 104/68  Pulse: 93 68 70 68  Resp: 16 18 19 17   Temp: 98.6 F (37 C) 97.8 F (36.6 C)    TempSrc: Oral Oral    SpO2: 93% 98% 95% 96%  Weight:      Height:       Supplemental O2: Room Air SpO2: 96 %  Filed Weights   05/20/24 1007  Weight: 71.7 kg     Intake/Output Summary (Last 24 hours) at 05/21/2024 1101 Last data filed at 05/21/2024 1019 Gross per 24 hour  Intake 1865.96 ml  Output 750 ml  Net 1115.96 ml   Net IO Since Admission: 1,115.96 mL [05/21/24 1101]  Physical Exam: Physical Exam Constitutional: Awake, alert in NAD HENT: Normocephalic, atraumatic Card: RRR, No MRG, No pitting edema on LE's  bilaterally  Chest: No tenderness to palpation on right or left sternal area Resp: LCTAB, no increased work of breathing Abd: Soft, NTND  Patient Lines/Drains/Airways Status     Active Line/Drains/Airways     Name Placement date Placement time Site Days   Peripheral IV 05/20/24 20 G Anterior;Proximal;Right Forearm 05/20/24  1018  Forearm  1   Peripheral IV 05/21/24 18 G 1.88 Anterior;Distal;Left;Upper Arm 05/21/24  1026  Arm  less than 1            Pertinent labs and imaging:     Latest Ref Rng & Units 05/21/2024    6:14 AM 05/20/2024   11:35 AM 05/20/2024    9:11 AM  CBC  WBC 4.0 - 10.5 K/uL 6.2   15.9   Hemoglobin 13.0 - 17.0 g/dL 87.0  83.6  86.1   Hematocrit 39.0 - 52.0 % 37.9  48.0  42.8   Platelets 150 - 400 K/uL 270   353        Latest Ref Rng & Units 05/21/2024    6:14 AM 05/20/2024   11:35 AM 05/20/2024    9:11 AM  CMP  Glucose 70 - 99 mg/dL 883   87   BUN 6 - 20 mg/dL 7   5   Creatinine 9.38 - 1.24 mg/dL 9.08   8.83   Sodium 864 - 145 mmol/L 134  128  135   Potassium 3.5 - 5.1 mmol/L 3.1  5.8  3.3   Chloride  98 - 111 mmol/L 103   99   CO2 22 - 32 mmol/L 23   11   Calcium  8.9 - 10.3 mg/dL 9.0   9.5   Total Protein 6.5 - 8.1 g/dL   7.9   Total Bilirubin 0.0 - 1.2 mg/dL   2.3   Alkaline Phos 38 - 126 U/L   97   AST 15 - 41 U/L   52   ALT 0 - 44 U/L   21     ECHOCARDIOGRAM COMPLETE Result Date: 05/20/2024    ECHOCARDIOGRAM REPORT   Patient Name:   Xavier Owens Date of Exam: 05/20/2024 Medical Rec #:  [id number]        Height:       68.0 in Accession #:    [id number]       Weight:       158.0 lb Date of Birth:  Feb 25, 1971        BSA:          1.849 m Patient Age:    53 years         BP:           122/71 mmHg Patient Gender: M                HR:           79 bpm. Exam Location:  Inpatient Procedure: 2D Echo, Cardiac Doppler and Color Doppler (Both Spectral and Color            Flow Doppler were utilized during procedure). Indications:    Chest Pain R07.9  History:         Patient has no prior history of Echocardiogram examinations.  Sonographer:    Tinnie Orion RDCS Referring Phys: [id number] XIKA ZHAO IMPRESSIONS  1. Left ventricular ejection fraction, by estimation, is 60 to 65%. The left ventricle has normal function. The left ventricle has no regional wall motion abnormalities. There is mild left ventricular hypertrophy. Left ventricular diastolic parameters were normal.  2. Right ventricular systolic function is normal. The right ventricular size is normal.  3. The mitral valve is normal in structure. No evidence of mitral valve regurgitation. No evidence of mitral stenosis.  4. The aortic valve was not well visualized. Aortic valve regurgitation is not visualized. No aortic stenosis is present.  5. The inferior vena cava is normal in size with greater than 50% respiratory variability, suggesting right atrial pressure of 3 mmHg. FINDINGS  Left Ventricle: Left ventricular ejection fraction, by estimation, is 60 to 65%. The left ventricle has normal function. The left ventricle has no regional wall motion abnormalities. Strain was performed and the global longitudinal strain is indeterminate. The left ventricular internal cavity size was normal in size. There is mild left ventricular hypertrophy. Left ventricular diastolic parameters were normal. Right Ventricle: The right ventricular size is normal. No increase in right ventricular wall thickness. Right ventricular systolic function is normal. Left Atrium: Left atrial size was normal in size. Right Atrium: Right atrial size was normal in size. Pericardium: There is no evidence of pericardial effusion. Mitral Valve: The mitral valve is normal in structure. No evidence of mitral valve regurgitation. No evidence of mitral valve stenosis. Tricuspid Valve: The tricuspid valve is normal in structure. Tricuspid valve regurgitation is not demonstrated. No evidence of tricuspid stenosis. Aortic Valve: The aortic valve was not well  visualized. Aortic valve regurgitation is not visualized. No aortic stenosis is present. Pulmonic Valve: The pulmonic valve was normal in  structure. Pulmonic valve regurgitation is not visualized. No evidence of pulmonic stenosis. Aorta: The aortic root is normal in size and structure. Venous: The inferior vena cava is normal in size with greater than 50% respiratory variability, suggesting right atrial pressure of 3 mmHg. IAS/Shunts: No atrial level shunt detected by color flow Doppler. Additional Comments: 3D was performed not requiring image post processing on an independent workstation and was indeterminate.  LEFT VENTRICLE PLAX 2D LVIDd:         4.10 cm   Diastology LVIDs:         2.50 cm   LV e' medial:    6.96 cm/s LV PW:         1.30 cm   LV E/e' medial:  10.0 LV IVS:        1.20 cm   LV e' lateral:   10.90 cm/s LVOT diam:     2.10 cm   LV E/e' lateral: 6.4 LV SV:         63 LV SV Index:   34 LVOT Area:     3.46 cm  RIGHT VENTRICLE             IVC RV S prime:     12.50 cm/s  IVC diam: 0.90 cm TAPSE (M-mode): 2.6 cm LEFT ATRIUM           Index        RIGHT ATRIUM           Index LA diam:      3.10 cm 1.68 cm/m   RA Area:     10.90 cm LA Vol (A2C): 49.8 ml 26.93 ml/m  RA Volume:   20.20 ml  10.93 ml/m LA Vol (A4C): 18.0 ml 9.74 ml/m  AORTIC VALVE LVOT Vmax:   95.00 cm/s LVOT Vmean:  66.500 cm/s LVOT VTI:    0.183 m  AORTA Ao Root diam: 3.30 cm Ao Asc diam:  3.50 cm MITRAL VALVE MV Area (PHT): 4.96 cm    SHUNTS MV Decel Time: 153 msec    Systemic VTI:  0.18 m MV E velocity: 69.70 cm/s  Systemic Diam: 2.10 cm MV A velocity: 73.40 cm/s MV E/A ratio:  0.95 Maude Emmer MD Electronically signed by Maude Emmer MD Signature Date/Time: 05/20/2024/3:14:42 PM    Final     ASSESSMENT/PLAN:  Assessment: Principal Problem:   Chest discomfort Active Problems:   Alcohol use disorder, moderate, dependence (HCC)   Major depression, recurrent (HCC)   Electrolyte abnormality   Chest pain   Elevated troponin    Hypotension   Plan: #Chest discomfort Elevated troponin Patient continues to have pain with respiration and chest discomfort. Echo done yesterday unremarkable and most recent EKG showing some QT prolongation possibly secondary to Seroquel . Less concerning for PE since US  revealed no heart strain. IVC not distended.  -ok for coronary CT today since BP improvement per cardiology.  continue atorvastatin  40 mg daily -continue acetaminophen  1000 mg q6h  #Hypotension Patient was persistently hypotensive overnight with some improvement after bolus administration, no tachycardia, doesn't seem to be a shock state. Can consider coronary CT today per cardiology. Was given outpatient propanolol, which might have contributed to hypotension.  -LR 1000 mL bolus -continue to monitor BP and hydration status -d/c propanolol, seroquel , and trazodone   #AGMA - resolved Electrolyte abnormality Hypokalemia, potassium replacement ongoing. Follow potassium on BMP.  AGMA resolved with dextrose  containing fluids, likely from alcohol or starvation ketosis. -dextrose  removed from fluids -repeat BMP in AM  #  Alcohol use disorder, moderate, dependence (HCC) CIWA most recently 0 -regular CIWA checks, discontinue lorazepam  -continue thiamine  100 mg every day -continue folic acid  1 mg daily -multivitamin  #Major depression, recurrent (HCC) QT prolongation seen on most recent EKG.  -d/c seroquel  and trazodone  -continue outpatient duloxetine  60 mg daily -continue outpatient hydroxyzine  25 mg q6h PRN  Best Practice: Diet: Regular diet IVF: Fluids: LR, Rate: 999 cc bolus VTE: enoxaparin  (LOVENOX ) injection 40 mg Start: 05/20/24 2200 Code: Full  Disposition planning: Therapy Recs: Pending, DME: none Family Contact: Victory JAYSON Butler (father), to be notified. DISPO: Anticipated discharge pending to Home pending clinical improvement.  Signature:  Brad Prey Medical Student 11:01 AM, 05/21/2024    Attestation for Student Documentation:  I personally was present and performed or re-performed the history, physical exam and medical decision-making activities of this service and have verified that the service and findings are accurately documented in the student's note.  Ozell Kung MD 05/21/2024, 12:26 PM

## 2024-05-21 NOTE — Progress Notes (Signed)
 Dr. Isobel was made aware that  pt's BP=68/50 and 82/68. HR=105 - 110 Pt is Alert and oriented X4. Skin is warm and dry. Pt received inderol 10mg  at 2200 hrs. At 21:35 he received seroquel , trazadone, and neurontin . Repeat BP was done, still low. D5LR was increased to 500ml/hr.

## 2024-05-21 NOTE — TOC Initial Note (Signed)
 Transition of Care [facility]) - Initial/Assessment Note    Patient Details  Name: Xavier Owens MRN: [id number] Date of Birth: [birth year]  [facility] CM/SW Contact:    Sudie Erminio Deems, RN Phone Number: 05/21/2024, 12:38 PM  Clinical Narrative:  Patient presented for chest pain. PTA patient states he was independent from home alone. Patient reports that his family checks on him when needed. Patient has PCP and he states he gets to appointments without any issues. Patient uses [facility] and gets medications without any issues. No home needs identified during this visit. Case Manager will continue to follow for additional needs.   Expected Discharge Plan: Home/Self Care Barriers to Discharge: Continued Medical Work up   Patient Goals and CMS Choice Patient states their goals for this hospitalization and ongoing recovery are:: Patient to return home once stable.   Choice offered to / list presented to : NA      Expected Discharge Plan and Services In-house Referral: NA Discharge Planning Services: CM Consult Post Acute Care Choice: NA Living arrangements for the past 2 months: Single Family Home                   DME Agency: NA       HH Arranged: NA          Prior Living Arrangements/Services Living arrangements for the past 2 months: Single Family Home Lives with:: Self Patient language and need for interpreter reviewed:: Yes Do you feel safe going back to the place where you live?: Yes      Need for Family Participation in Patient Care: No (Comment) Care giver support system in place?: No (comment)   Criminal Activity/Legal Involvement Pertinent to Current Situation/Hospitalization: No - Comment as needed  Activities of Daily Living   ADL Screening (condition at time of admission) Independently performs ADLs?: Yes (appropriate for developmental age) Is the patient deaf or have difficulty hearing?: No Does the patient  have difficulty seeing, even when wearing glasses/contacts?: No Does the patient have difficulty concentrating, remembering, or making decisions?: No  Permission Sought/Granted Permission sought to share information with : Case Manager, Family Supports                Emotional Assessment Appearance:: Appears stated age Attitude/Demeanor/Rapport: Engaged Affect (typically observed): Appropriate Orientation: : Oriented to Self, Oriented to Place, Oriented to  Time, Oriented to Situation Alcohol / Substance Use: Not Applicable Psych Involvement: No (comment)  Admission diagnosis:  Chest discomfort [R07.89] Chest pain, unspecified type [R07.9] Chest pain [R07.9] Patient Active Problem List   Diagnosis Date Noted   Chest pain 05/21/2024   Elevated troponin 05/21/2024   Hypotension 05/21/2024   Chest discomfort 05/20/2024   Electrolyte abnormality 05/20/2024   Alcohol withdrawal (HCC) 05/09/2024   AKI (acute kidney injury) (HCC) 05/09/2024   Leukocytosis 05/09/2024   Heat exhaustion, water deprivation 05/09/2024   Major depression, recurrent (HCC) 04/03/2024   Reactive depression 03/22/2024   Alcohol withdrawal syndrome with complication (HCC) 03/22/2024   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   Dehydration 03/21/2024   Acute lactic acidosis 03/21/2024   Delirium tremens (HCC) 03/18/2024   Nausea & vomiting 03/16/2024   Alcohol abuse 03/16/2024   Tremor 03/16/2024   Neuropathy 03/16/2024   Mood disorder (HCC) 03/16/2024   Memory impairment 02/08/2024   Right knee pain 02/08/2024   Major depressive disorder, recurrent severe without psychotic features (HCC) 01/25/2024   Alcohol withdrawal seizure (HCC) 01/23/2024   Alcohol use disorder,  severe, dependence (HCC) 01/18/2024   Healthcare maintenance 06/10/2022   Nightmares 12/21/2021   Subclinical hypothyroidism 12/21/2021   Substance induced mood disorder (HCC) 12/14/2021   Alcohol use disorder, moderate, dependence (HCC)  06/23/2021   Seizures (HCC) 06/22/2021   Elevated liver enzymes 04/19/2016   Essential hypertension 04/19/2016   PCP:  [name], DO Pharmacy:   [id number] - [place] - [address] [place] ([place]) [place] [address] Phone: [phone] Fax: [phone]  [id number] - [place], [place] - [address] AT [facility] [address]  [place] [address] Phone: [phone] Fax: [phone]  [facility] [address] [place] [address] Phone: [phone] Fax: [phone]  [facility] - [facility] [address] [place] [address] Phone: [phone] Fax: [phone]     Social Drivers of Health (SDOH) Social History: SDOH Screenings   Food Insecurity: No Food Insecurity (05/20/2024)  Housing: Low Risk  (05/20/2024)  Transportation Needs: No Transportation Needs (05/20/2024)  Utilities: Not At Risk (05/20/2024)  Alcohol Screen: Low Risk  (04/22/2024)  Recent Concern: Alcohol Screen - Medium Risk (04/03/2024)  Depression (PHQ2-9): Low Risk  (04/22/2024)  Social Connections: Socially Integrated (01/24/2024)  Tobacco Use: High Risk (05/20/2024)   SDOH Interventions:     Readmission Risk Interventions     No data to display

## 2024-05-22 DIAGNOSIS — R933 Abnormal findings on diagnostic imaging of other parts of digestive tract: Secondary | ICD-10-CM

## 2024-05-22 DIAGNOSIS — R1319 Other dysphagia: Secondary | ICD-10-CM

## 2024-05-22 DIAGNOSIS — F1010 Alcohol abuse, uncomplicated: Secondary | ICD-10-CM

## 2024-05-22 DIAGNOSIS — R0789 Other chest pain: Secondary | ICD-10-CM | POA: Diagnosis not present

## 2024-05-22 DIAGNOSIS — R1310 Dysphagia, unspecified: Secondary | ICD-10-CM

## 2024-05-22 DIAGNOSIS — R634 Abnormal weight loss: Secondary | ICD-10-CM

## 2024-05-22 DIAGNOSIS — Z712 Person consulting for explanation of examination or test findings: Secondary | ICD-10-CM

## 2024-05-22 LAB — CBC
HCT: 33 % — ABNORMAL LOW (ref 39.0–52.0)
Hemoglobin: 11 g/dL — ABNORMAL LOW (ref 13.0–17.0)
MCH: 28.7 pg (ref 26.0–34.0)
MCHC: 33.3 g/dL (ref 30.0–36.0)
MCV: 86.2 fL (ref 80.0–100.0)
Platelets: 219 K/uL (ref 150–400)
RBC: 3.83 MIL/uL — ABNORMAL LOW (ref 4.22–5.81)
RDW: 14.4 % (ref 11.5–15.5)
WBC: 6.1 K/uL (ref 4.0–10.5)
nRBC: 0 % (ref 0.0–0.2)

## 2024-05-22 LAB — BASIC METABOLIC PANEL WITH GFR
Anion gap: 6 (ref 5–15)
BUN: 5 mg/dL — ABNORMAL LOW (ref 6–20)
CO2: 24 mmol/L (ref 22–32)
Calcium: 8.1 mg/dL — ABNORMAL LOW (ref 8.9–10.3)
Chloride: 107 mmol/L (ref 98–111)
Creatinine, Ser: 0.75 mg/dL (ref 0.61–1.24)
GFR, Estimated: >60 mL/min (ref >60)
Glucose, Bld: 100 mg/dL — ABNORMAL HIGH (ref 70–99)
Potassium: 3.6 mmol/L (ref 3.5–5.1)
Sodium: 137 mmol/L (ref 135–145)

## 2024-05-22 MED ORDER — QUETIAPINE FUMARATE 50 MG PO TABS
50.0000 mg | ORAL_TABLET | Freq: Once | ORAL | Status: AC
  Administered 2024-05-22: 50 mg via ORAL
  Filled 2024-05-22: qty 1

## 2024-05-22 MED ORDER — TRAZODONE HCL 50 MG PO TABS
50.0000 mg | ORAL_TABLET | Freq: Once | ORAL | Status: AC
  Administered 2024-05-22: 50 mg via ORAL
  Filled 2024-05-22: qty 1

## 2024-05-22 NOTE — Consult Note (Addendum)
 Consultation Note   Referring Provider:  Internal Medicine Teaching Service PCP: Xavier Braun, DO Primary Gastroenterologist:   Xavier Xavier Owens      Reason for Consultation:  Dysphagia and abn esophagram DOA: 05/20/2024         Hospital Day: 3   ASSESSMENT    53 yo male with a history of Etoh abuse, anxiety, depression admitted with chest pain / elevated HS troponin  Chest pain / elevated HS troponin 131 >> 76 / mildly elevated CRP  Imaging negative for PE . Cardiology evaluated.  Echocardiogram unremarkable, normal coronary CT. Despite elevated troponins, there is concern for noncardiac chest pain, especially since pain  is worse with respiration, movement, and it is somewhat reproducible on exam. Pain doesn't sound esophageal related / GERD in nature  Chronic solid food dysphagia (at least 30 years duration) but slowly progressive Abnormal barium swallow with tertiary contractions and lodging of barium tablet.  No esophageal stricture/narrowing noted. Probably has esophageal dysmotility disorder but stricture should be ruled out  Hypotension  Improving with IV. Had been taking outpatient propanolol which may have contributed.   Prolonged QT  Etoh abuse Mildly elevated LFTs Drank heavily for years but since February has reduced intake to about 1 pint of liquor a week.  Total bilirubin 2.3 , mild elevation in AST.  AST to ALT ratio compatible with EtOH.  RUQ ultrasound 05/08/2024 done during prior ED visit for chest pain was normal.  No gallstones, normal CBD caliber  ? Avascular necrosis on CT scan   See PMH for additional history  Principal Problem:   Chest discomfort Active Xavier Owens:   Alcohol use disorder, moderate, dependence (HCC)   Major depression, recurrent (HCC)   Electrolyte abnormality   Chest pain   Elevated troponin   Hypotension     PLAN:   --CIWA protocol in place --Electrolyte repletion in  progress --Continue empiric pantoprazole  once daily -- Xavier Xavier Owens,  Xavier Xavier Owens. The risks and benefits of EGD with possible biopsies were discussed with the patient who agrees to proceed.  -- Eventual outpatient screening colonoscopy  HPI   Patient was recently discharged from the hospital after admission for chest pain / heat exhaustion / dehydration / AKI /EtOH withdrawal.  During that admission his troponins were normal.  No acute abnormalities on CT scan.  RUQ ultrasound was unremarkable.    Xavier Xavier Owens was readmitted 7/7 after presenting with recurrent nonradiating chest pain with respiration as well as activity/movement.  No related shortness of breath.  Workup remarkable for elevated high sensitivity troponins, electrolyte abnormalities and elevated CRP. Chest CT negative for PE. Cardiology evaluated and recommended coronary CT which showed no evidence of CAD, coronary calcium  score 0.   During this admission Xavier Xavier Owens reported that Xavier has been having Xavier Owens swallowing.  Xavier gives a history of solid food dysphagia for at least 30 years .  Anytime Xavier takes a bite of solid food Xavier has to first take a swallow of liquids and then after the bite of food has to take another swallow with liquids .  It seems like over the years the dysphagia has gotten progressively worse.  Xavier is having to eat smaller  bites.  It is more effort to eat .  No problem swallowing liquid.  Xavier has never had an EGD . Esophagram this admission demonstrated tertiary contraction, small HH. Barium tablet lodged at GEJ and wouldn't pass with water or barium sips.   Xavier Xavier Owens.  Xavier used to have Xavier Owens with acid reflux but not anymore.  Xavier has only occasional loose stool depending on diet.  No blood in stool.  Xavier is never had a screening colonoscopy.  No known family history of colon cancer   Labs and Imaging:  Recent Labs    05/20/24 0911  PROT 7.9   ALBUMIN 3.8  AST 52*  ALT 21  ALKPHOS 97  BILITOT 2.3*   Recent Labs    05/20/24 0911 05/20/24 1135 05/21/24 0614 05/22/24 0409  WBC 15.9*  --  6.2 6.1  HGB 13.8 16.3 12.9* 11.0*  HCT 42.8 48.0 37.9* 33.0*  MCV 87.2  --  83.5 86.2  PLT 353  --  270 219   Recent Labs    05/20/24 0911 05/20/24 1135 05/21/24 0614 05/22/24 0409  NA 135 128* 134* 137  K 3.3* 5.8* 3.1* 3.6  CL 99  --  103 107  CO2 11*  --  23 24  GLUCOSE 87  --  116* 100*  BUN 5*  --  7 5*  CREATININE 1.16  --  0.91 0.75  CALCIUM  9.5  --  9.0 8.1*     DG ESOPHAGUS W SINGLE CM (SOL OR THIN BA) CLINICAL DATA:  53 year old male with choking sensation on solid foods for diagnostic esophagram.  EXAM: ESOPHAGUS/BARIUM SWALLOW/TABLET STUDY  TECHNIQUE: Single contrast examination was performed using thin liquid barium. This exam was performed by Xavier C. Augusta, [name], and was supervised and interpreted by Xavier Xavier Owens.  FLUOROSCOPY: Radiation Exposure Index (as provided by the fluoroscopic device): 55.7 mGy Kerma  COMPARISON:  None Available.  FINDINGS: Swallowing: Appears normal. No vestibular penetration or aspiration seen.  Pharynx: Unremarkable.  Esophagus: Normal appearance, no esophageal lesions or strictures visualized.  Esophageal motility: Tertiary contractions visualized.  Hiatal Hernia: Small hiatal hernia visualized.  Gastroesophageal reflux: None visualized, even with provocative maneuvers.  Ingested 13mm barium tablet: Became lodged at the gastroesophageal junction. 13 mm barium tablet would not pass even with subsequent sips of water, swallows, and barium sips. [name]  Other: Patient was unable to stand leading to limited exam.  IMPRESSION: Small hiatal hernia and tertiary contractions visualized. Barium tablet became stuck at gastroesophageal junction. Otherwise normal esophagram.  Performed By Lavanda Augusta, [name]  Electronically Signed   By: CHRISTELLA.  Shick M.D.   On: 05/21/2024 16:35 CT CORONARY MORPH W/CTA COR W/SCORE W/CA W/CM &/OR WO/CM Addendum: ADDENDUM REPORT: 05/21/2024 16:21   EXAM:  OVER-READ INTERPRETATION  CT CHEST   The following report is an over-read performed by radiologist Dr.  Manford Xavier Owens [facility] on 05/21/2024. This over-read does  not include interpretation of cardiac or coronary anatomy or  pathology. The coronary CTA interpretation by the cardiologist is  attached.   COMPARISON:  CTA chest dated 05/08/2024, 05/03/2021   FINDINGS:  Cardiovascular: Normal appearance of extracardiac vascular  structures.   Mediastinum/Nodes: Normal esophagus. No pathologically enlarged  mediastinal or hilar lymph nodes.   Lungs/Pleura: The imaged central airways are patent. 3 mm right  middle lobe nodule (11:11) is unchanged dating back to at least  05/03/2021, likely benign. No focal  consolidation. No pneumothorax.  No pleural effusion.   Upper abdomen: Normal.   Musculoskeletal: No acute or abnormal lytic or blastic osseous  lesions.   IMPRESSION:  No acute or significant extracardiac abnormalities.   Electronically Signed    By: Limin  Xu M.D.    On: 05/21/2024 16:21 Narrative: HISTORY: 53 yo male with chest pain/anginal equiv, ECGs or troponins abnormal  EXAM: Cardiac/Coronary CTA  TECHNIQUE: The patient was scanned on a [employer].  PROTOCOL: A 100 kV prospective scan was triggered in the descending thoracic aorta at 111 HU's. Axial non-contrast 3 mm slices were carried out through the heart. The data set was analyzed on a dedicated work station and scored using the Agatson method. Gantry rotation speed was 250 msecs and collimation was .6 mm. Beta blockade and 0.8 mg of sl NTG was given. The 3D data set was reconstructed in 5% intervals of the 35-75 % of the R-R cycle. Diastolic phases were analyzed on a dedicated work station  using MPR, MIP and VRT modes. The patient received 100mL OMNIPAQUE  IOHEXOL  350 MG/ML SOLN contrast.  FINDINGS: Quality: Good, HR 73, mis-registration artifact  Coronary calcium  score: The patient's coronary artery calcium  score is 0.  Coronary arteries: Normal coronary origins.  Right dominance.  Right Coronary Artery: Dominant. No disease. Normal R-PLB and R-PDA branches.  Left Main Coronary Artery: Normal. Bifurcates into the LAD and LCx arteries.  Left Anterior Descending Coronary Artery: Large anterior artery that wraps around the apex. No disease. Large D1 and D2 branches, no disease.  Left Circumflex Artery: AV groove vessel, no disease.  Aorta: Normal size, 31 mm at the mid ascending aorta (level of the PA bifurcation) measured double oblique. No calcifications. No dissection.  Aortic Valve: Trileaflet. No calcifications.  Other findings:  Normal pulmonary vein drainage into the left atrium.  Normal left atrial appendage without a thrombus.  Normal size of the pulmonary artery.  IMPRESSION: 1. No evidence of CAD, CADRADS = 0.  2. Coronary artery calcium  score is 0.  3. Normal coronary origin with right dominance.  4. Consider non-coronary causes of chest pain.  Electronically Signed: By: Vinie JAYSON Maxcy M.D. On: 05/21/2024 14:47    Past Medical History:  Diagnosis Date   Alcoholic (HCC)    Hypertension    Seizures (HCC)    Tremor     Past Surgical History:  Procedure Laterality Date   dislocated hip     HAND SURGERY     KNEE SURGERY      Family History  Problem Relation Age of Onset   Cancer Mother    Cancer Father     Prior to Admission medications   Medication Sig Start Date End Date Taking? Authorizing Provider  Cholecalciferol  (VITAMIN D -3 PO) Take 1 capsule by mouth daily.   Yes [provider]  diclofenac  Sodium (VOLTAREN ) 1 % GEL Apply 2 g topically 4 (four) times daily. Patient taking differently: Apply 2 g topically  4 (four) times daily as needed (pain). 04/12/24  Yes Kennyth Starleen RAMAN, MD  DULoxetine  (CYMBALTA ) 60 MG capsule Take 1 capsule (60 mg total) by mouth daily. 04/22/24  Yes Harl Regan E, NP  ferrous sulfate  325 (65 FE) MG tablet Take 1 tablet (325 mg total) by mouth at bedtime. 04/12/24  Yes Kennyth Starleen RAMAN, MD  folic acid  (FOLVITE ) 1 MG tablet Take 1 tablet (1 mg total) by mouth daily. 04/12/24  Yes Kennyth Starleen RAMAN, MD  gabapentin  (NEURONTIN ) 300 MG capsule  Take 2 capsules (600 mg total) by mouth 3 (three) times daily. 04/22/24  Yes Harl Regan E, NP  hydrOXYzine  (ATARAX ) 25 MG tablet Take 1 tablet (25 mg total) by mouth every 6 (six) hours as needed for anxiety (or CIWA score </= 10). 04/22/24  Yes Harl Regan BRAVO, NP  Multiple Vitamins-Minerals (MULTIVITAMIN MEN) TABS Take 1 tablet by mouth daily.   Yes [provider]  naltrexone  (DEPADE) 50 MG tablet Take 1 tablet (50 mg total) by mouth daily. 04/22/24  Yes Harl Regan E, NP  pantoprazole  (PROTONIX ) 40 MG tablet Take 1 tablet (40 mg total) by mouth daily. 04/12/24  Yes Kennyth Starleen RAMAN, MD  propranolol  (INDERAL ) 10 MG tablet Take 1 tablet (10 mg total) by mouth 2 (two) times daily. 04/22/24  Yes Harl Regan E, NP  QUEtiapine  (SEROQUEL ) 100 MG tablet Take 1 tablet (100 mg total) by mouth at bedtime. 04/22/24  Yes Harl Regan E, NP  thiamine  (VITAMIN B1) 100 MG tablet Take 100 mg by mouth daily.   Yes [provider]  traZODone  (DESYREL ) 100 MG tablet Take 1 tablet (100 mg total) by mouth at bedtime. 04/22/24  Yes Harl Regan E, NP  menthol -cetylpyridinium (CEPACOL) 3 MG lozenge Take 1 lozenge (3 mg total) by mouth as needed for sore throat. Patient not taking: Reported on 05/20/2024 05/10/24   Celestina Czar, MD    Current Facility-Administered Medications  Medication Dose Route Frequency Provider Last Rate Last Admin   acetaminophen  (TYLENOL ) tablet 1,000 mg  1,000 mg Oral Q6H McLendon, Michael, MD   1,000 mg  at 05/22/24 9160   atorvastatin  (LIPITOR) tablet 40 mg  40 mg Oral QHS McLendon, Michael, MD   40 mg at 05/21/24 2102   DULoxetine  (CYMBALTA ) DR capsule 60 mg  60 mg Oral Daily McLendon, Michael, MD   60 mg at 05/22/24 9161   enoxaparin  (LOVENOX ) injection 40 mg  40 mg Subcutaneous Q24H Francesco Elsie NOVAK, MD   40 mg at 05/21/24 2102   folic acid  (FOLVITE ) tablet 1 mg  1 mg Oral Daily McLendon, Michael, MD   1 mg at 05/22/24 9160   hydrOXYzine  (ATARAX ) tablet 25 mg  25 mg Oral Q6H PRN Norrine Sharper, MD       lidocaine  (LIDODERM ) 5 % 1 patch  1 patch Transdermal Q24H Amilibia, Jaden, DO   1 patch at 05/21/24 2102   multivitamin with minerals tablet 1 tablet  1 tablet Oral Daily McLendon, Michael, MD   1 tablet at 05/22/24 9161   pantoprazole  (PROTONIX ) EC tablet 40 mg  40 mg Oral Daily McLendon, Michael, MD   40 mg at 05/22/24 9161   thiamine  (VITAMIN B1) tablet 100 mg  100 mg Oral Daily McLendon, Michael, MD   100 mg at 05/22/24 9161    Allergies as of 05/20/2024   (No Known Allergies)    Social History   Socioeconomic History   Marital status: Married    Spouse name: Not on file   Number of children: Not on file   Years of education: Not on file   Highest education level: Not on file  Occupational History   Not on file  Tobacco Use   Smoking status: Every Day    Current packs/day: 0.25    Types: Cigarettes   Smokeless tobacco: Never  Vaping Use   Vaping status: Never Used  Substance and Sexual Activity   Alcohol use: Yes    Comment: a fifth of vodka every day for 30  years   Drug use: Not Currently    Types: Marijuana   Sexual activity: Yes  Other Topics Concern   Not on file  Social History Narrative   ** Merged History Encounter **       Social Drivers of Health   Financial Resource Strain: Not on file  Food Insecurity: No Food Insecurity (05/20/2024)   Hunger Vital Sign    Worried About Running Out of Food in the Last Year: Never true    Ran Out of Food in  the Last Year: Never true  Transportation Needs: No Transportation Needs (05/20/2024)   PRAPARE - Administrator, Civil Service (Medical): No    Lack of Transportation (Non-Medical): No  Physical Activity: Not on file  Stress: Not on file  Social Connections: Socially Integrated (01/24/2024)   Social Connection and Isolation Panel    Frequency of Communication with Friends and Family: More than three times a week    Frequency of Social Gatherings with Friends and Family: More than three times a week    Attends Religious Services: More than 4 times per year    Active Member of [employer] or Organizations: Yes    Attends Banker Meetings: More than 4 times per year    Marital Status: Living with partner  Intimate Partner Violence: Not At Risk (05/20/2024)   Humiliation, Afraid, Rape, and Kick questionnaire    Fear of Current or Ex-Partner: No    Emotionally Abused: No    Physically Abused: No    Sexually Abused: No     Code Status   Code Status: Full Code  Review of Systems: All systems reviewed and negative except where noted in HPI.  Physical Exam: Vital signs in last 24 hours: Temp:  [97.9 F (36.6 C)-98.6 F (37 C)] 97.9 F (36.6 C) (07/09 1218) Pulse Rate:  [71-81] 81 (07/09 1218) Resp:  [16-18] 17 (07/09 1218) BP: (114-141)/(74-89) 141/89 (07/09 1218) SpO2:  [94 %-99 %] 98 % (07/09 1218) Last BM Date : 05/18/24  General:  Pleasant male in NAD Psych:  Cooperative. Normal mood and affect Eyes: Pupils equal Ears:  Normal auditory acuity Nose: No deformity, discharge or lesions Neck:  Supple, no masses felt Lungs:  Clear to auscultation.  Heart:  Regular rate, regular rhythm.  Abdomen:  Soft, nondistended, nontender, active bowel sounds, no masses felt Rectal :  Deferred Msk: Symmetrical without gross deformities.  Neurologic:  Alert, oriented, grossly normal neurologically Extremities : No edema Skin:  Intact without significant lesions.     Intake/Output from previous day: 07/08 0701 - 07/09 0700 In: 2907.7 [P.O.:400; I.V.:506; IV Piggyback:2001.8] Out: 3850 [Urine:3850] Intake/Output this shift:  Total I/O In: 480 [P.O.:480] Out: -    Vina Dasen, NP-C   05/22/2024, 4:43 PM  I have taken an interval history, thoroughly reviewed the chart and examined the patient. I agree with the Advanced Practitioner's note, impression and recommendations, and have recorded additional findings, impressions and recommendations below. I performed a substantive portion of this encounter (>50% time spent), including a complete performance of the medical decision making.  My additional thoughts are as follows:  Many years of solid food dysphagia, but worsening in at least the last 6 months and associated with weight loss.?  Reflux related stricture? EoE Barium study with tertiary contractions and half inch barium tablet lodged at EG junction, though no description of esophageal mass.  Cause of chest pain and elevated troponin as yet unclear, so far his  cardiac workup is negative.  ?  Pericarditis Will touch base with cardiology to make sure they feel this patient is at no more than average risk to undergo upper endoscopy  Ideally, I would like to get Mr. Lascola upper endoscopy with dilation performed prior to discharge given the severity of his symptoms.  I told him the schedule and endoscopy may not allow that tomorrow, but I will keep him n.p.o. after midnight for the option and we will see what things look like.  If not, then I hope we may be able to keep him over until Xavier Owens so I can get it done. Xavier was agreeable to that plan.  Victory LITTIE Brand III Office:[phone]

## 2024-05-22 NOTE — Progress Notes (Addendum)
 HD#1 SUBJECTIVE:  Patient Summary: Xavier Owens is a 53 y.o. with a pertinent PMH of alcohol use, who presented with chest discomfort and was admitted for chest discomfort and elevated troponin. Reproducible with palpation and breathing. He has a history of esophageal dysphagia and food getting stuck while he eats.   Overnight Events: Overnight, he had difficulty sleeping and discussed with Dr. Isobel adjustment to his sleep regimen since his trazodone  and Seroquel  were paused on 7/8 due to subsequent overnight hypotension. Shared decision making was used and patient amenable to half of his trazodone  and Seroquel  regimen. Patient was hemodynamically stable overnight but BP slightly lower at 114/74 and 96/85 overnight, respectively.   Interim History: Pt seen and examined at the bedside this morning. He relays that his chest discomfort has decreased and he has noticed improvement. OBJECTIVE:  Vital Signs: Vitals:   05/21/24 1817 05/21/24 2017 05/21/24 2349 05/22/24 0447  BP:  135/76 120/80 114/74  Pulse:  75 71 79  Resp:  16 18 16   Temp: 98.6 F (37 C) 98.5 F (36.9 C) 98.6 F (37 C) 98.3 F (36.8 C)  TempSrc:  Oral Oral Oral  SpO2:  99% 94% 95%  Weight:      Height:       Supplemental O2: Room Air SpO2: 95 %  Filed Weights   05/20/24 1007  Weight: 71.7 kg     Intake/Output Summary (Last 24 hours) at 05/22/2024 0657 Last data filed at 05/22/2024 0634 Gross per 24 hour  Intake 2907.71 ml  Output 3850 ml  Net -942.29 ml   Net IO Since Admission: -782.29 mL [05/22/24 0657]  Physical Exam: Const:: Awake, alert in NAD. Lying comfortably in bed HENT: Normocephalic, atraumatic Card: RRR, No MRG, No pitting edema on LE's bilaterally  Resp: LCTAB, no increased work of breathing Abd: Soft, NTND, Bsx4 Psych: Normal mood and affect     Patient Lines/Drains/Airways Status     Active Line/Drains/Airways     Name Placement date Placement time Site Days   Peripheral IV  05/20/24 20 G Anterior;Proximal;Right Forearm 05/20/24  1018  Forearm  2   Peripheral IV 05/21/24 18 G 1.88 Anterior;Distal;Left;Upper Arm 05/21/24  1026  Arm  1            Pertinent labs and imaging:     Latest Ref Rng & Units 05/22/2024    4:09 AM 05/21/2024    6:14 AM 05/20/2024   11:35 AM  CBC  WBC 4.0 - 10.5 K/uL 6.1  6.2    Hemoglobin 13.0 - 17.0 g/dL 88.9  87.0  83.6   Hematocrit 39.0 - 52.0 % 33.0  37.9  48.0   Platelets 150 - 400 K/uL 219  270         Latest Ref Rng & Units 05/22/2024    4:09 AM 05/21/2024    6:14 AM 05/20/2024   11:35 AM  CMP  Glucose 70 - 99 mg/dL 899  883    BUN 6 - 20 mg/dL 5  7    Creatinine 9.38 - 1.24 mg/dL 9.24  9.08    Sodium 864 - 145 mmol/L 137  134  128   Potassium 3.5 - 5.1 mmol/L 3.6  3.1  5.8   Chloride 98 - 111 mmol/L 107  103    CO2 22 - 32 mmol/L 24  23    Calcium  8.9 - 10.3 mg/dL 8.1  9.0      DG ESOPHAGUS W SINGLE CM (SOL OR  THIN BA) Result Date: 05/21/2024 CLINICAL DATA:  53 year old male with choking sensation on solid foods for diagnostic esophagram. EXAM: ESOPHAGUS/BARIUM SWALLOW/TABLET STUDY TECHNIQUE: Single contrast examination was performed using thin liquid barium. This exam was performed by [name], [name], and was supervised and interpreted by [name]. FLUOROSCOPY: Radiation Exposure Index (as provided by the fluoroscopic device): 55.7 mGy Kerma COMPARISON:  None Available. FINDINGS: Swallowing: Appears normal. No vestibular penetration or aspiration seen. Pharynx: Unremarkable. Esophagus: Normal appearance, no esophageal lesions or strictures visualized. Esophageal motility: Tertiary contractions visualized. Hiatal Hernia: Small hiatal hernia visualized. Gastroesophageal reflux: None visualized, even with provocative maneuvers. Ingested 13mm barium tablet: Became lodged at the gastroesophageal junction. 13 mm barium tablet would not pass even with subsequent sips of water, swallows, and barium sips. Patient  Education performed on barium tablet dissolution. Other: Patient was unable to stand leading to limited exam. IMPRESSION: Small hiatal hernia and tertiary contractions visualized. Barium tablet became stuck at gastroesophageal junction. Otherwise normal esophagram. Performed By Lavanda Augusta, [name] Electronically Signed   By: CHRISTELLA.  Shick M.D.   On: 05/21/2024 16:35   CT CORONARY MORPH W/CTA COR W/SCORE W/CA W/CM &/OR WO/CM Addendum Date: 05/21/2024 ADDENDUM REPORT: 05/21/2024 16:21 EXAM: OVER-READ INTERPRETATION  CT CHEST The following report is an over-read performed by radiologist Dr. Manford Breaker [facility] on 05/21/2024. This over-read does not include interpretation of cardiac or coronary anatomy or pathology. The coronary CTA interpretation by the cardiologist is attached. COMPARISON:  CTA chest dated 05/08/2024, 05/03/2021 FINDINGS: Cardiovascular: Normal appearance of extracardiac vascular structures. Mediastinum/Nodes: Normal esophagus. No pathologically enlarged mediastinal or hilar lymph nodes. Lungs/Pleura: The imaged central airways are patent. 3 mm right middle lobe nodule (11:11) is unchanged dating back to at least 05/03/2021, likely benign. No focal consolidation. No pneumothorax. No pleural effusion. Upper abdomen: Normal. Musculoskeletal: No acute or abnormal lytic or blastic osseous lesions. IMPRESSION: No acute or significant extracardiac abnormalities. Electronically Signed   By: Limin  Xu M.D.   On: 05/21/2024 16:21   Result Date: 05/21/2024 HISTORY: 53 yo male with chest pain/anginal equiv, ECGs or troponins abnormal EXAM: Cardiac/Coronary CTA TECHNIQUE: The patient was scanned on a [employer]. PROTOCOL: A 100 kV prospective scan was triggered in the descending thoracic aorta at 111 HU's. Axial non-contrast 3 mm slices were carried out through the heart. The data set was analyzed on a dedicated work station and scored using the Agatson method. Gantry rotation speed was  250 msecs and collimation was .6 mm. Beta blockade and 0.8 mg of sl NTG was given. The 3D data set was reconstructed in 5% intervals of the 35-75 % of the R-R cycle. Diastolic phases were analyzed on a dedicated work station using MPR, MIP and VRT modes. The patient received OMNIPAQUE  IOHEXOL  350 MG/ML SOLN contrast. FINDINGS: Quality: Good, HR 73, mis-registration artifact Coronary calcium  score: The patient's coronary artery calcium  score is 0. Coronary arteries: Normal coronary origins.  Right dominance. Right Coronary Artery: Dominant. No disease. Normal R-PLB and R-PDA branches. Left Main Coronary Artery: Normal. Bifurcates into the LAD and LCx arteries. Left Anterior Descending Coronary Artery: Large anterior artery that wraps around the apex. No disease. Large D1 and D2 branches, no disease. Left Circumflex Artery: AV groove vessel, no disease. Aorta: Normal size, 31 mm at the mid ascending aorta (level of the PA bifurcation) measured double oblique. No calcifications. No dissection. Aortic Valve: Trileaflet. No calcifications. Other findings: Normal pulmonary vein drainage into the left atrium. Normal  left atrial appendage without a thrombus. Normal size of the pulmonary artery. IMPRESSION: 1. No evidence of CAD, CADRADS = 0. 2. Coronary artery calcium  score is 0. 3. Normal coronary origin with right dominance. 4. Consider non-coronary causes of chest pain. Electronically Signed: By: Vinie JAYSON Maxcy M.D. On: 05/21/2024 14:47    ASSESSMENT/PLAN:  Assessment: Principal Problem:   Chest discomfort Active Problems:   Alcohol use disorder, moderate, dependence (HCC)   Major depression, recurrent (HCC)   Electrolyte abnormality   Chest pain   Elevated troponin   Hypotension  Plan: #Chest discomfort Elevated troponin Pain improved today. Reassuring tests such as echo, US , and coronary CT to rule out cardiac/PE etiology to chest discomfort which is reproducible to palpation, likely  costochondritis. -continue atorvastatin  -continue tylenol  -continue lidocaine  patch -consider outpatient management of this issue with workup of possible costochondritis.  #Esophageal Dysphagia Patient has difficulty swallowing dry foods, such as bread or biscuits. Is able to swallow foods with sauce or water. Has been able to eat food since here, such as Roast with gravy. Esophagram showed tertiary contractions, hiatal hernia and barium pill stuck at GE junction.  -GI consulted -regular diet as tolerated  #AUD Most recent CIWA 2. Notable for headache. -continue CIWA checks -continue thiamine  100 mg every day -continue folic acid  1 mg every day -continue multivitamin  #Hypotension MDD Qtc prolongation D/c seroquel  and trazodone  yesterday due to hypotension over night of 7/7. Patient had difficulty falling asleep last night so given in half doses.  QT prolongation seen on EKG yesterday and QT interval improved on repeat today at 486. Discussed with patient hypotensive effects of seroquel  and trazodone  and discussed alternatives such as zolpidem . Patient wishes to remain on current regimen.   -continue outpatient duloxetine  60 mg daily -continue outpatient hydroxyzine  25 mg q6h PRN -continue adjusted sleep regimen.   #Electrolyte abnormality - resolved Repeat BMP this morning reassuring with normalized potassium at 3.6  Best Practice: Diet: Regular diet IVF: Fluids: none, Rate: None VTE: enoxaparin  (LOVENOX ) injection 40 mg Start: 05/20/24 2200 Code: Full  Disposition planning: Therapy Recs: Pending, DME: none Family Contact: Xavier Owens (father), to be notified. DISPO: Anticipated discharge pending to Home pending GI consult and clinical improvement.  Signature:  Brad Prey MS3 6:57 AM, 05/22/2024  On Call pager [phone]   I was personally present and re-performed the exam and medical decision making and verified the service and findings are accurately  documented in the student's note.  Schuyler Novak, DO 05/22/2024 3:33 PM

## 2024-05-22 NOTE — Progress Notes (Addendum)
  Progress Note  Patient Name: Xavier Owens Date of Encounter: 05/22/2024 [facility] Cardiologist: Shelda Bruckner, MD   Interval Summary   Reported that he still continues to have some shortness of breath and chest discomfort.  Was able to get up and walk to the bathroom.  After getting up and going to the bathroom had some nausea and vomited.  Vital Signs Vitals:   05/21/24 1817 05/21/24 2017 05/21/24 2349 05/22/24 0447  BP:  135/76 120/80 114/74  Pulse:  75 71 79  Resp:  16 18 16   Temp: 98.6 F (37 C) 98.5 F (36.9 C) 98.6 F (37 C) 98.3 F (36.8 C)  TempSrc:  Oral Oral Oral  SpO2:  99% 94% 95%  Weight:      Height:        Intake/Output Summary (Last 24 hours) at 05/22/2024 0752 Last data filed at 05/22/2024 9365 Gross per 24 hour  Intake 2907.71 ml  Output 3850 ml  Net -942.29 ml      05/20/2024   10:07 AM 05/08/2024    3:01 PM 05/02/2024   10:22 AM  Last 3 Weights  Weight (lbs) 158 lb 160 lb 160 lb  Weight (kg) 71.668 kg 72.576 kg 72.576 kg      Telemetry/ECG  Normal sinus rhythm with heart rates in the 60s to 70s.  Has had multiple brief episodes of SVT- Personally Reviewed  Physical Exam  GEN: No acute distress.  Alert and orientated on room air Neck: No JVD Cardiac: RRR, no murmurs, rubs, or gallops.  Respiratory: Clear to auscultation bilaterally. GI: Soft, nontender, non-distended  MS: No edema  Assessment & Plan  Subhan Hoopes is a 53 y.o. male with a hx of alcohol use disorder, alcohol withdrawal with seizure, hypertension, depression, tobacco use,  who is being seen 05/20/2024 for the evaluation of chest pain.   Chest pain, pleuritic  Elevated troponin 131>76 presented with right sided resting chest pain started Sat, worsened with deep breathing and coughing, also has shortness of breath, overall atypical chest pain. Hs trop 131>76. EKG with non-specific findings, CXR no acute finding. Pulmonary CTA negative for PE and dissection.  Echo this hospitalization showed normal LVEF 60-65%, No RWMA, mild LVH, normal RV function, normal valve function.  Labs otherwise showed mild AKI, hypokalemia, anion gap metabolic acidosis,  mild transaminitis, and leukocytosis. LDL 95, ESR normal at 4, CRP elevated at 3.3. Cardiac CT found a coronary artery calcium  score of 0, and normal coronary arteries.   Hiatal hernia Had an esophagram performed yesterday that found an hiatal hernia and tertiary contractions.  GI is reportedly planning to see the patient.    Mild AKI resolved Cr worsened after Pulmonary CTA (1.16). Improved with IV fluids, Cr 0.75 today.   Hypotension resolved Was able to get cardiac CT and blood pressure has improved today.  Most recent BP 114/74.   Otherwise managed per primary    For questions or updates, please contact [facility] Please consult [email or web address] for contact info under       Signed, Shaft Corigliano, [name]

## 2024-05-23 ENCOUNTER — Inpatient Hospital Stay (HOSPITAL_COMMUNITY): Payer: MEDICAID | Admitting: Anesthesiology

## 2024-05-23 ENCOUNTER — Encounter (HOSPITAL_COMMUNITY): Payer: Self-pay | Admitting: Internal Medicine

## 2024-05-23 ENCOUNTER — Encounter (HOSPITAL_COMMUNITY)
Admission: EM | Disposition: A | Discharge status: Home / Self Care | Payer: Self-pay | Source: Home / Self Care | Attending: Internal Medicine

## 2024-05-23 DIAGNOSIS — K449 Diaphragmatic hernia without obstruction or gangrene: Secondary | ICD-10-CM

## 2024-05-23 DIAGNOSIS — K222 Esophageal obstruction: Secondary | ICD-10-CM

## 2024-05-23 HISTORY — PX: ESOPHAGOGASTRODUODENOSCOPY: SHX5428

## 2024-05-23 LAB — CBC
HCT: 39.5 % (ref 39.0–52.0)
Hemoglobin: 12.8 g/dL — ABNORMAL LOW (ref 13.0–17.0)
MCH: 27.9 pg (ref 26.0–34.0)
MCHC: 32.4 g/dL (ref 30.0–36.0)
MCV: 86.2 fL (ref 80.0–100.0)
Platelets: 252 K/uL (ref 150–400)
RBC: 4.58 MIL/uL (ref 4.22–5.81)
RDW: 14.6 % (ref 11.5–15.5)
WBC: 6.3 K/uL (ref 4.0–10.5)
nRBC: 0 % (ref 0.0–0.2)

## 2024-05-23 LAB — BASIC METABOLIC PANEL WITH GFR
Anion gap: 15 (ref 5–15)
BUN: <5 mg/dL — ABNORMAL LOW (ref 6–20)
CO2: 22 mmol/L (ref 22–32)
Calcium: 9.7 mg/dL (ref 8.9–10.3)
Chloride: 102 mmol/L (ref 98–111)
Creatinine, Ser: 0.64 mg/dL (ref 0.61–1.24)
GFR, Estimated: >60 mL/min (ref >60)
Glucose, Bld: 92 mg/dL (ref 70–99)
Potassium: 3.4 mmol/L — ABNORMAL LOW (ref 3.5–5.1)
Sodium: 139 mmol/L (ref 135–145)

## 2024-05-23 LAB — MAGNESIUM: Magnesium: 1.6 mg/dL — ABNORMAL LOW (ref 1.7–2.4)

## 2024-05-23 SURGERY — EGD (ESOPHAGOGASTRODUODENOSCOPY)
Anesthesia: Monitor Anesthesia Care

## 2024-05-23 MED ORDER — PROPOFOL 500 MG/50ML IV EMUL
INTRAVENOUS | Status: DC | PRN
  Administered 2024-05-23: 150 ug/kg/min via INTRAVENOUS

## 2024-05-23 MED ORDER — SODIUM CHLORIDE 0.9 % IV SOLN: INTRAVENOUS | Status: DC | PRN

## 2024-05-23 MED ORDER — SODIUM CHLORIDE 0.9 % IV SOLN: INTRAVENOUS | Status: DC

## 2024-05-23 MED ORDER — POTASSIUM CHLORIDE 20 MEQ PO PACK
60.0000 meq | PACK | Freq: Once | ORAL | Status: AC
  Administered 2024-05-23: 60 meq via ORAL
  Filled 2024-05-23: qty 3

## 2024-05-23 MED ORDER — PROPOFOL 10 MG/ML IV BOLUS
INTRAVENOUS | Status: DC | PRN
  Administered 2024-05-23: 30 mg via INTRAVENOUS
  Administered 2024-05-23: 40 mg via INTRAVENOUS
  Administered 2024-05-23: 30 mg via INTRAVENOUS
  Administered 2024-05-23: 40 mg via INTRAVENOUS

## 2024-05-23 MED ORDER — LIDOCAINE 2% (20 MG/ML) 5 ML SYRINGE
INTRAMUSCULAR | Status: DC | PRN
  Administered 2024-05-23: 100 mg via INTRAVENOUS

## 2024-05-23 MED ORDER — TRAZODONE HCL 50 MG PO TABS
50.0000 mg | ORAL_TABLET | Freq: Every day | ORAL | Status: DC
  Administered 2024-05-23: 50 mg via ORAL
  Filled 2024-05-23: qty 1

## 2024-05-23 MED ORDER — QUETIAPINE FUMARATE 50 MG PO TABS
50.0000 mg | ORAL_TABLET | Freq: Every day | ORAL | Status: DC
  Administered 2024-05-23: 50 mg via ORAL
  Filled 2024-05-23: qty 1

## 2024-05-23 MED ORDER — ORAL CARE MOUTH RINSE: 15.0000 mL | OROMUCOSAL | Status: DC | PRN

## 2024-05-23 MED ORDER — MAGNESIUM SULFATE 4 GM/100ML IV SOLN
4.0000 g | Freq: Once | INTRAVENOUS | Status: AC
  Administered 2024-05-23: 4 g via INTRAVENOUS
  Filled 2024-05-23: qty 100

## 2024-05-23 MED ORDER — ATORVASTATIN CALCIUM 40 MG PO TABS
40.0000 mg | ORAL_TABLET | Freq: Every day | ORAL | Status: DC
  Administered 2024-05-23 – 2024-05-24 (×2): 40 mg via ORAL
  Filled 2024-05-23 (×2): qty 1

## 2024-05-23 NOTE — Progress Notes (Addendum)
 HD#2 SUBJECTIVE:  Patient Summary: Xavier Owens is a 53 y.o. with a pertinent PMH of alcohol use, who presented with chest discomfort and was admitted for chest discomfort and elevated troponin. Reproducible with palpation and breathing. He has a history of esophageal dysphagia and food getting stuck while he eats.   Overnight Events: Overnight, patient received the same sleep regimen as previous night of half doses of trazodone  and Seroquel  and tolerated this well with better ability to sleep.   Interim History: Patient has had decreased chest discomfort and pain with breathing. He has not eaten in the last day due to being NPO in preparation for EGD per GI instruction. He has been achieving adequate oral hydration and reports more urination. He denies GI symptoms such as diarrhea or nausea.  OBJECTIVE:  Vital Signs: Vitals:   05/22/24 2013 05/22/24 2327 05/23/24 0403 05/23/24 0826  BP: (!) 148/87 112/85 (P) 119/78 127/85  Pulse: 84  (P) 67 (!) 114  Resp: 18 18 (P) 18 16  Temp: 98 F (36.7 C) 98.5 F (36.9 C) (P) 98.2 F (36.8 C) 98.5 F (36.9 C)  TempSrc: Oral Oral (P) Oral Oral  SpO2: 98%  (P) 98%   Weight:      Height:       Supplemental O2: Room Air SpO2: (P) 98 %  Filed Weights   05/20/24 1007  Weight: 71.7 kg     Intake/Output Summary (Last 24 hours) at 05/23/2024 1104 Last data filed at 05/23/2024 0828 Gross per 24 hour  Intake 840 ml  Output --  Net 840 ml   Net IO Since Admission: 297.71 mL [05/23/24 1104]  Physical Exam: Const: Awake, alert in NAD. Lying comfortably in bed HENT: Normocephalic, atraumatic Card: RRR, No MRG, No pitting edema on LE's bilaterally  Resp: LCTAB, no increased work of breathing Abd: Soft, NTND, Bsx4 Psych: Normal mood and affect     Patient Lines/Drains/Airways Status     Active Line/Drains/Airways     Name Placement date Placement time Site Days   Peripheral IV 05/20/24 20 G Anterior;Proximal;Right Forearm 05/20/24   1018  Forearm  2   Peripheral IV 05/21/24 18 G 1.88 Anterior;Distal;Left;Upper Arm 05/21/24  1026  Arm  1            Pertinent labs and imaging:     Latest Ref Rng & Units 05/23/2024    4:12 AM 05/22/2024    4:09 AM 05/21/2024    6:14 AM  CBC  WBC 4.0 - 10.5 K/uL 6.3  6.1  6.2   Hemoglobin 13.0 - 17.0 g/dL 87.1  88.9  87.0   Hematocrit 39.0 - 52.0 % 39.5  33.0  37.9   Platelets 150 - 400 K/uL 252  219  270        Latest Ref Rng & Units 05/23/2024    4:12 AM 05/22/2024    4:09 AM 05/21/2024    6:14 AM  CMP  Glucose 70 - 99 mg/dL 92  899  883   BUN 6 - 20 mg/dL 5  5  7    Creatinine 0.61 - 1.24 mg/dL 9.35  9.24  9.08   Sodium 135 - 145 mmol/L 139  137  134   Potassium 3.5 - 5.1 mmol/L 3.4  3.6  3.1   Chloride 98 - 111 mmol/L 102  107  103   CO2 22 - 32 mmol/L 22  24  23    Calcium  8.9 - 10.3 mg/dL 9.7  8.1  9.0     No results found.   ASSESSMENT/PLAN:  Assessment: Principal Problem:   Chest discomfort Active Problems:   Alcohol use disorder, moderate, dependence (HCC)   Major depression, recurrent (HCC)   Electrolyte abnormality   Chest pain   Elevated troponin   Hypotension   Encounter to discuss test results   Non-cardiac chest pain   Esophageal dysphagia   Abnormal loss of weight   Abnormal finding on GI tract imaging  Plan: #Chest discomfort Non-cardiac chest pain Elevated troponin Pain improved today. Reassuring tests such as echo, US , and coronary CT to rule out cardiac/PE etiology to chest discomfort which is reproducible to palpation, likely costochondritis. Cardio and GI consults yesterday indicated lower suspicions for cardiac or GERD related etiology to his chest discomfort, respectively.  -continue atorvastatin  -continue tylenol  -continue lidocaine  patch -consider outpatient management of this issue with workup of possible costochondritis.  #Esophageal Dysphagia Abnormal finding on GI tract imaging Esophagram showed tertiary contractions, hiatal  hernia and barium pill stuck at GE junction. GI consult yesterday indicated suspicion for esophageal motility issue and recommends EGD with outpatient colonoscopy. -NPO -EGD completion either today or tomorrow  #AUD Most recent CIWA 0.  -continue CIWA checks -continue thiamine  100 mg every day -continue folic acid  1 mg every day -continue multivitamin  #Hypotension -resolved MDD Qtc prolongation - resolved QT prolongation seen on EKG 7/8 and QT interval improved on repeat yesterday at 486. Patient hemodynamically stable overnight with adjusted sleep regimen of half doses of outpatient trazodone  and Seroquel .  -continue outpatient duloxetine  60 mg daily -continue outpatient hydroxyzine  25 mg q6h PRN -continue adjusted sleep regimen.   #Electrolyte abnormality Repeat BMP this morning indicates potassium lower at 3.4. Most recent Mg was 1.6 so potassium replenishment could be considered if Mg also given -replenish Mg and K  Best Practice: Diet: Regular diet IVF: Fluids: none, Rate: None VTE: enoxaparin  (LOVENOX ) injection 40 mg Start: 05/20/24 2200 Code: Full  Disposition planning: Therapy Recs: Pending, DME: none Family Contact: Victory JAYSON Butler (father), to be notified. DISPO: Anticipated discharge pending to Home pending EGD and clinical improvement.  Signature:  I was personally present and re-performed the exam and medical decision making and verified the service and findings are accurately documented in the student's note.  Schuyler Novak, DO 05/23/2024 11:55 AM   Brad Prey MS3 11:04 AM, 05/23/2024

## 2024-05-23 NOTE — Plan of Care (Signed)

## 2024-05-23 NOTE — Op Note (Signed)
 [facility] Patient Name: Xavier Owens Procedure Date : 05/23/2024 MRN: [id number] Attending MD: Victory CROME. Legrand , MD, [id number] Date of Birth: 1971/07/17 CSN: [id number] Age: 53 Admit Type: Inpatient Procedure:                Upper GI endoscopy Indications:              Esophageal dysphagia, Weight loss                           Abnormal barium study showing half-inch barium                            tablet lodged at EG junction                           Patient said dysphagia for many years, worsening                            about the last 6 months and he attributes his                            weight loss to this. Providers:                Victory CROME. Legrand, MD, Randall Lines, RN, Coye Bade, Technician Referring MD:             [facility] Medicines:                Monitored Anesthesia Care Complications:            No immediate complications. Estimated Blood Loss:     Estimated blood loss was minimal. Procedure:                Pre-Anesthesia Assessment:                           - Prior to the procedure, a History and Physical                            was performed, and patient medications and                            allergies were reviewed. The patient's tolerance of                            previous anesthesia was also reviewed. The risks                            and benefits of the procedure and the sedation                            options and risks were discussed with the patient.  All questions were answered, and informed consent                            was obtained. Prior Anticoagulants: The patient has                            taken no anticoagulant or antiplatelet agents. ASA                            Grade Assessment: III - A patient with severe                            systemic disease. After reviewing the risks and                            benefits, the patient  was deemed in satisfactory                            condition to undergo the procedure.                           After obtaining informed consent, the endoscope was                            passed under direct vision. Throughout the                            procedure, the patient's blood pressure, pulse, and                            oxygen saturations were monitored continuously. The                            [id number]-H190 ([id number] Olympus endoscope was introduced                            through the mouth, and advanced to the second part                            of duodenum. The upper GI endoscopy was                            accomplished without difficulty. The patient                            tolerated the procedure. Scope In: Scope Out: Findings:      A small sliding hiatal hernia was present, only visible intermittently       with deep inspiration.      A moderate Schatzki ring (11 to 12 mm diameter) was found at the       gastroesophageal junction. Scope passed easily through it in the entire       EG junction area into the stomach. A TTS dilator was passed through the       scope. Dilation with  a 15-16.5-18 mm balloon dilator was performed to       16.5 mm after 30 seconds of insufflation at both diameters, the scope       was pulled back and forth through the EG junction for further dilation       of the ring. The dilation site was examined and showed moderate mucosal       disruption and moderate improvement in luminal narrowing.      The stomach was normal.      The cardia and gastric fundus were normal on retroflexion.      The examined duodenum was normal. Impression:               - Small hiatal hernia.                           - Moderate Schatzki ring. Dilated.                           - Normal stomach.                           - Normal examined duodenum.                           - No specimens collected. Recommendation:           - Return patient to  hospital ward for ongoing care.                           - Soft diet for 2 days, then return to regular                            diet. Meet should ideally be ground. Chew and cut                            food well, slow mealtime, plenty liquids                           Contact my office after discharge to arrange                            screening colonoscopy and also be seen in the                            office if needed for recurrence of dysphagia.                           Inpatient GI service signing off-call as needed Procedure Code(s):        --- Professional ---                           [phone], Esophagogastroduodenoscopy, flexible,                            transoral; with transendoscopic balloon dilation of  esophagus (less than 30 mm diameter) Diagnosis Code(s):        --- Professional ---                           K44.9, Diaphragmatic hernia without obstruction or                            gangrene                           K22.2, Esophageal obstruction                           R13.14, Dysphagia, pharyngoesophageal phase                           R63.4, Abnormal weight loss CPT copyright 2022 [employer]. All rights reserved. The codes documented in this report are preliminary and upon coder review may  be revised to meet current compliance requirements. Amadi Frady L. Legrand, MD 05/23/2024 3:55:27 PM This report has been signed electronically. Number of Addenda: 0

## 2024-05-23 NOTE — Anesthesia Procedure Notes (Signed)
 Procedure Name: MAC Date/Time: 05/23/2024 3:36 PM  Performed by: Emmitt Millman, CRNAPre-anesthesia Checklist: Patient identified, Emergency Drugs available, Suction available and Patient being monitored Oxygen Delivery Method: Nasal cannula

## 2024-05-23 NOTE — Interval H&P Note (Signed)
 History and Physical Interval Note:  05/23/2024 3:14 PM  Xavier Owens  has presented today for surgery, with the diagnosis of Dysphagia and weight loss.  The various methods of treatment have been discussed with the patient and family. After consideration of risks, benefits and other options for treatment, the patient has consented to  Procedure(s): EGD (ESOPHAGOGASTRODUODENOSCOPY) (N/A) as a surgical intervention.  The patient's history has been reviewed, patient examined, no change in status, stable for surgery.  I have reviewed the patient's chart and labs.  Questions were answered to the patient's satisfaction.     Victory LITTIE Brand III

## 2024-05-23 NOTE — Transfer of Care (Signed)
 Immediate Anesthesia Transfer of Care Note  Patient: Xavier Owens  Procedure(s) Performed: EGD (ESOPHAGOGASTRODUODENOSCOPY)  Patient Location: Endoscopy Unit  Anesthesia Type:MAC  Level of Consciousness: awake  Airway & Oxygen Therapy: Patient Spontanous Breathing and Patient connected to nasal cannula oxygen  Post-op Assessment: Report given to RN and Post -op Vital signs reviewed and stable  Post vital signs: Reviewed and stable  Last Vitals:  Vitals Value Taken Time  BP 101/75 05/23/24 15:50  Temp    Pulse 102 05/23/24 15:50  Resp 19 05/23/24 15:50  SpO2 93 % 05/23/24 15:50  Vitals shown include unfiled device data.  Last Pain:  Vitals:   05/23/24 1406  TempSrc: Temporal  PainSc: 0-No pain      Patients Stated Pain Goal: 0 (05/22/24 2032)  Complications: No notable events documented.

## 2024-05-23 NOTE — Anesthesia Preprocedure Evaluation (Addendum)
 Anesthesia Evaluation  Patient identified by MRN, date of birth, ID band Patient awake    Reviewed: Allergy & Precautions, H&P , NPO status , Patient's Chart, lab work & pertinent test results  Airway Mallampati: II  TM Distance: >3 FB Neck ROM: Full    Dental no notable dental hx. (+) Teeth Intact, Dental Advisory Given   Pulmonary Current Smoker   Pulmonary exam normal breath sounds clear to auscultation       Cardiovascular hypertension, Pt. on medications  Rhythm:Regular Rate:Normal     Neuro/Psych Seizures -, Well Controlled,    Depression       GI/Hepatic negative GI ROS,,,(+)     substance abuse  alcohol use  Endo/Other  negative endocrine ROS    Renal/GU negative Renal ROS  negative genitourinary   Musculoskeletal   Abdominal   Peds  Hematology negative hematology ROS (+)   Anesthesia Other Findings   Reproductive/Obstetrics negative OB ROS                              Anesthesia Physical Anesthesia Plan  ASA: 2  Anesthesia Plan: MAC   Post-op Pain Management: Minimal or no pain anticipated   Induction: Intravenous  PONV Risk Score and Plan: 0 and Propofol  infusion  Airway Management Planned: Natural Airway and Simple Face Mask  Additional Equipment:   Intra-op Plan:   Post-operative Plan:   Informed Consent: I have reviewed the patients History and Physical, chart, labs and discussed the procedure including the risks, benefits and alternatives for the proposed anesthesia with the patient or authorized representative who has indicated his/her understanding and acceptance.     Dental advisory given  Plan Discussed with: CRNA  Anesthesia Plan Comments:          Anesthesia Quick Evaluation

## 2024-05-23 NOTE — Progress Notes (Signed)
 Stop by to check on patient.  He is possibly for an EGD today depending on whether schedule allows.  He remains n.p.o. for now.

## 2024-05-23 NOTE — Plan of Care (Signed)
  Problem: Clinical Measurements: Goal: Will remain free from infection Outcome: Progressing Goal: Respiratory complications will improve Outcome: Progressing Goal: Cardiovascular complication will be avoided Outcome: Progressing   Problem: Activity: Goal: Risk for activity intolerance will decrease Outcome: Progressing   Problem: Pain Managment: Goal: General experience of comfort will improve and/or be controlled Outcome: Progressing   Problem: Safety: Goal: Ability to remain free from injury will improve Outcome: Progressing

## 2024-05-23 NOTE — H&P (View-Only) (Signed)
 Stop by to check on patient.  He is possibly for an EGD today depending on whether schedule allows.  He remains n.p.o. for now.

## 2024-05-24 ENCOUNTER — Other Ambulatory Visit (HOSPITAL_COMMUNITY): Payer: Self-pay

## 2024-05-24 ENCOUNTER — Telehealth (HOSPITAL_COMMUNITY): Payer: Self-pay | Admitting: Pharmacy Technician

## 2024-05-24 DIAGNOSIS — R1319 Other dysphagia: Secondary | ICD-10-CM

## 2024-05-24 LAB — RENAL FUNCTION PANEL
Albumin: 3.3 g/dL — ABNORMAL LOW (ref 3.5–5.0)
Anion gap: 11 (ref 5–15)
BUN: 6 mg/dL (ref 6–20)
CO2: 22 mmol/L (ref 22–32)
Calcium: 9.4 mg/dL (ref 8.9–10.3)
Chloride: 103 mmol/L (ref 98–111)
Creatinine, Ser: 0.61 mg/dL (ref 0.61–1.24)
GFR, Estimated: >60 mL/min (ref >60)
Glucose, Bld: 100 mg/dL — ABNORMAL HIGH (ref 70–99)
Phosphorus: 4.9 mg/dL — ABNORMAL HIGH (ref 2.5–4.6)
Potassium: 3.6 mmol/L (ref 3.5–5.1)
Sodium: 136 mmol/L (ref 135–145)

## 2024-05-24 LAB — CBC
HCT: 38 % — ABNORMAL LOW (ref 39.0–52.0)
Hemoglobin: 12.6 g/dL — ABNORMAL LOW (ref 13.0–17.0)
MCH: 28.9 pg (ref 26.0–34.0)
MCHC: 33.2 g/dL (ref 30.0–36.0)
MCV: 87.2 fL (ref 80.0–100.0)
Platelets: 237 K/uL (ref 150–400)
RBC: 4.36 MIL/uL (ref 4.22–5.81)
RDW: 14.7 % (ref 11.5–15.5)
WBC: 5.1 K/uL (ref 4.0–10.5)
nRBC: 0 % (ref 0.0–0.2)

## 2024-05-24 MED ORDER — QUETIAPINE FUMARATE 50 MG PO TABS
50.0000 mg | ORAL_TABLET | Freq: Every day | ORAL | 0 refills | Status: DC
  Filled 2024-05-24: qty 30, 30d supply, fill #0

## 2024-05-24 MED ORDER — FOLIC ACID 1 MG PO TABS
1.0000 mg | ORAL_TABLET | Freq: Every day | ORAL | 0 refills | Status: DC
  Filled 2024-05-24: qty 30, 30d supply, fill #0

## 2024-05-24 MED ORDER — ATORVASTATIN CALCIUM 40 MG PO TABS
40.0000 mg | ORAL_TABLET | Freq: Every day | ORAL | 0 refills | Status: DC
  Filled 2024-05-24: qty 30, 30d supply, fill #0

## 2024-05-24 MED ORDER — TRAZODONE HCL 50 MG PO TABS
50.0000 mg | ORAL_TABLET | Freq: Every day | ORAL | 0 refills | Status: DC
  Filled 2024-05-24: qty 30, 30d supply, fill #0

## 2024-05-24 MED ORDER — PANTOPRAZOLE SODIUM 40 MG PO TBEC
40.0000 mg | DELAYED_RELEASE_TABLET | Freq: Every day | ORAL | 2 refills | Status: DC
  Filled 2024-05-24: qty 30, 30d supply, fill #0

## 2024-05-24 MED ORDER — LIDOCAINE 5 % EX PTCH
1.0000 | MEDICATED_PATCH | CUTANEOUS | 0 refills | Status: DC
  Filled 2024-05-24: qty 30, 30d supply, fill #0

## 2024-05-24 NOTE — Telephone Encounter (Signed)
 Pharmacy Patient Advocate Encounter   Received notification from Inpatient Request that prior authorization for Lidocaine  5% patches is required/requested.   Insurance verification completed.   The patient is insured through [facility] .   Per test claim: PA required; PA submitted to above mentioned insurance via CoverMyMeds Key/confirmation #/EOC [id number] Status is pending

## 2024-05-24 NOTE — Plan of Care (Signed)
  Problem: Health Behavior/Discharge Planning: Goal: Ability to manage health-related needs will improve Outcome: Progressing   Problem: Clinical Measurements: Goal: Ability to maintain clinical measurements within normal limits will improve Outcome: Progressing Goal: Will remain free from infection Outcome: Progressing Goal: Respiratory complications will improve Outcome: Progressing Goal: Cardiovascular complication will be avoided Outcome: Progressing   Problem: Activity: Goal: Risk for activity intolerance will decrease Outcome: Progressing   Problem: Safety: Goal: Ability to remain free from injury will improve Outcome: Progressing

## 2024-05-24 NOTE — Anesthesia Postprocedure Evaluation (Signed)
 Anesthesia Post Note  Patient: Xavier Owens  Procedure(s) Performed: EGD (ESOPHAGOGASTRODUODENOSCOPY)     Patient location during evaluation: Endoscopy Anesthesia Type: MAC Level of consciousness: awake and alert Pain management: pain level controlled Vital Signs Assessment: post-procedure vital signs reviewed and stable Respiratory status: spontaneous breathing, nonlabored ventilation, respiratory function stable and patient connected to nasal cannula oxygen Cardiovascular status: stable and blood pressure returned to baseline Postop Assessment: no apparent nausea or vomiting Anesthetic complications: no   No notable events documented.               Lilyana Lippman,W. EDMOND

## 2024-05-24 NOTE — Plan of Care (Signed)
  Problem: Clinical Measurements: Goal: Ability to maintain clinical measurements within normal limits will improve Outcome: Adequate for Discharge Goal: Will remain free from infection Outcome: Adequate for Discharge Goal: Diagnostic test results will improve Outcome: Adequate for Discharge Goal: Respiratory complications will improve Outcome: Adequate for Discharge Goal: Cardiovascular complication will be avoided Outcome: Adequate for Discharge   Problem: Pain Managment: Goal: General experience of comfort will improve and/or be controlled Outcome: Adequate for Discharge

## 2024-05-24 NOTE — Hospital Course (Addendum)
 Chest discomfort Patient had chest discomfort and breathing trouble starting Saturday 7/5 while at home that worsened until 7/7 when he sought care in the ED, identified pain in right upper sternal border. It was reproducible to palpation and deep breath. EKG in ED was unremarkable and troponin was decreasing at time of admission. Given a load of 324 mg aspirin  in ambulance. Started on atorvastatin  40 mg daily and acetaminophen  1000 mg q6h upon admission.  Beside US  performed on 7/8 revealed no heart strain and non-distended IVC.  On 7/8, cardio consulted and recommended coronary CT to rule out coronary etiology of chest discomfort and result was unremarkable. Reassuring tests such as echo, US , and coronary CT to rule out cardiac/PE etiology to chest discomfort which is reproducible to palpation, likely costochondritis. Cardio and GI consults 7/9 indicated lower suspicions for cardiac or GERD related etiology to his chest discomfort, respectively. Patient was continued on regimen of atorvastatin , tylenol , lidocaine  patch. Outpatient management of this issue with workup of possible costochondritis recommended prior to discharge.   Esophageal dysphagia Esophagram showed tertiary contractions, hiatal hernia and barium pill stuck at GE junction. GI consult yesterday indicated suspicion for esophageal motility issue and recommends EGD with outpatient colonoscopy. He was placed on NPO and EGD with dilatation completed on 7/10. Patient should be on a soft diet that ends on 7/13.   Hypotension QTC prolongation Seroquel  and trazodone  were discontinued on 7/8 morning due to hypotension overnight 7/7. Given LR 1000 mL bolus and propanolol, seroquel , and trazodone  were d/c. Evening of 7/8, patient had difficulty sleeping because of discontinuation of seroquel  and trazodone  which were previously prescribed for sleep. Patient given half doses of seroquel  and trazodone  and BP monitored overnight with this regimen. QT  prolongation seen on EKG 7/8 and QT interval improved on repeat 7/9 at 486. Patient hemodynamically stable night of 7/6 with adjusted sleep regimen of half doses of outpatient trazodone  and Seroquel . Patient continued on this adjusted sleep regimen until discharge, outpatient duloxetine  60 mg daily, outpatient hydroxyzine  25 mg q6h PRN.   Alcohol Use disorder Last drink on 7/4 and was at low risk for withdrawal at admission. Orders placed for regular CIWA checks, thiamine  100 mg daily, folic acid  1 mg daily, and multivitamin upon admission. CIWA was stable throughout admission.   Electrolyte abnormality AGMA with respiratory alkalosis upon admission. Had similar AGMA on last admission associated with heat exposure and dehydration. Ethanol levels, UDS, and UA were checked upon admission. D5 initiated upon admission and discontinued on 7/8. Repeat BMP morning 7/10 indicated potassium lower at 3.4. Most recent Mg was 1.6 so Mg and K replenished. Major depression

## 2024-05-24 NOTE — Telephone Encounter (Signed)
 Pharmacy Patient Advocate Encounter  Received notification from [facility] that Prior Authorization for Lidocaine  5% patches  has been DENIED.  Full denial letter will be uploaded to the media tab. See denial reason below.   PA #/Case ID/Reference #: [id number]

## 2024-05-24 NOTE — Discharge Summary (Addendum)
 Name: Xavier Owens MRN: [id number] DOB: 1971-06-19 53 y.o. PCP: [name], DO  Date of Admission: 05/20/2024  8:56 AM Date of Discharge: 05/24/2024 Attending Physician: Dr. MICAEL Riis Winfrey  Discharge Diagnosis: 1. Principal Problem:   Chest discomfort Active Problems:   Alcohol use disorder, moderate, dependence (HCC)   Major depression, recurrent (HCC)   Electrolyte abnormality   Chest pain   Elevated troponin   Hypotension   Encounter to discuss test results   Non-cardiac chest pain   Esophageal dysphagia   Abnormal loss of weight   Abnormal finding on GI tract imaging   Schatzki's ring    Discharge Medications: Allergies as of 05/24/2024   No Known Allergies      Medication List     STOP taking these medications    gabapentin  300 MG capsule Commonly known as: NEURONTIN    menthol -cetylpyridinium 3 MG lozenge Commonly known as: CEPACOL   propranolol  10 MG tablet Commonly known as: INDERAL        TAKE these medications    atorvastatin  40 MG tablet Commonly known as: LIPITOR Take 1 tablet (40 mg total) by mouth daily. Start taking on: May 25, 2024   diclofenac  Sodium 1 % Gel Commonly known as: VOLTAREN  Apply 2 g topically 4 (four) times daily. What changed:  when to take this reasons to take this   DULoxetine  60 MG capsule Commonly known as: CYMBALTA  Take 1 capsule (60 mg total) by mouth daily.   ferrous sulfate  325 (65 FE) MG tablet Take 1 tablet (325 mg total) by mouth at bedtime.   folic acid  1 MG tablet Commonly known as: FOLVITE  Take 1 tablet (1 mg total) by mouth daily. Start taking on: May 25, 2024   hydrOXYzine  25 MG tablet Commonly known as: ATARAX  Take 1 tablet (25 mg total) by mouth every 6 (six) hours as needed for anxiety (or CIWA score </= 10).   lidocaine  5 % Commonly known as: LIDODERM  Place 1 patch onto the skin daily. Remove & Discard patch within 12 hours or as directed by MD   Multivitamin Men  Tabs Take 1 tablet by mouth daily.   naltrexone  50 MG tablet Commonly known as: DEPADE Take 1 tablet (50 mg total) by mouth daily.   pantoprazole  40 MG tablet Commonly known as: PROTONIX  Take 1 tablet (40 mg total) by mouth daily.   QUEtiapine  50 MG tablet Commonly known as: SEROQUEL  Take 1 tablet (50 mg total) by mouth at bedtime. What changed:  medication strength how much to take   thiamine  100 MG tablet Commonly known as: VITAMIN B1 Take 100 mg by mouth daily.   traZODone  50 MG tablet Commonly known as: DESYREL  Take 1 tablet (50 mg total) by mouth at bedtime. What changed:  medication strength how much to take   VITAMIN D -3 PO Take 1 capsule by mouth daily.        Disposition and follow-up:   [name] was discharged from [facility] in Good condition.  At the hospital follow up visit please address:  1.  Schatszki Ring--Ensure follow up with GI and he is tolerating regular food. Chest Pain--Likely costochondritis. Ensure resolution of symptoms.   2.  Labs / imaging needed at time of follow-up: BMP (Hypokalemic throughout hospitalization)  3.  Pending labs/ test needing follow-up: Colonoscopy per GI  Follow-up Appointments:  [facility] Medicine Center on 05/28/24 at [facility] Course by problem list: Xavier Owens is a 53 y.o. person living with a  history of alcohol use disorder who presented with chest pain and admitted for chest pain and elevated troponin now being discharged on hospital day 3 with the following pertinent hospital course:  Chest discomfort Esophageal dysphagia On 7/5, pt presented with pleuritic chest pain along the right sternal border. EKG in ED was unremarkable with elevated troponin at time of admission. Beside US  performed on 7/8 revealed no heart strain and non-distended IVC.  On 7/8, cardio consulted and recommended coronary CT to rule out coronary etiology of chest discomfort and result was  unremarkable. GI consulted 7/8 for complaints of chronic esophageal dysphagia and esophagram showing tertiary contractions with the barium tablet becoming stuck at the GE junction. EKG performed showed schatzki ring and dilation performed. 7/11 pt was feeling much better with resolution of his chest pain and deemed safe for discharge.   Hypotension QTC prolongation Seroquel  and trazodone  were discontinued on 7/8 morning due to hypotension overnight 7/7. Given LR 1000 mL bolus and propanolol, seroquel , and trazodone  were d/c. Evening of 7/8, patient had difficulty sleeping because of discontinuation of seroquel  and trazodone  which were previously prescribed for sleep. Patient given half doses of seroquel  and trazodone  and BP monitored overnight with this regimen. QT prolongation seen on EKG 7/8 and QT interval improved on repeat 7/9 at 486. Patient hemodynamically stable night of 7/9 with adjusted sleep regimen of half doses of outpatient trazodone  and Seroquel . Patient continued on this adjusted sleep regimen until discharge, outpatient duloxetine  60 mg daily, outpatient hydroxyzine  25 mg q6h PRN.   Alcohol Use disorder Last drink on 7/4 and was at low risk for withdrawal at admission. Orders placed for regular CIWA checks, thiamine  100 mg daily, folic acid  1 mg daily, and multivitamin upon admission. CIWA was stable throughout admission and pt was deemed safe for discharge on 7/11 and sent home with psychosocial resources.   Electrolyte abnormality AGMA with respiratory alkalosis upon admission. Had similar AGMA on last admission associated with heat exposure and dehydration. D5 initiated upon admission and discontinued on 7/8 due to resolution of AGMA. Persistently hypokalemic throughout hospitalization. Potassium repleted and K+ 3.6 at discharge.   Subjective  Pt seen and examined sitting up in the chair this morning. He states he is feeling much better and that his chest discomfort has resolved.  He would like to go home today. Educated on remaining on soft diet throughout tomorrow and hypotension potentially caused by his centrally acting medications. Pt verbalized understanding and all questions and concerns were addressed at this time.    Discharge Exam:   BP 115/82 (BP Location: Left Arm)   Pulse 89   Temp 98.6 F (37 C) (Oral)   Resp 18   Ht 5' 8 (1.727 m)   Wt 71.7 kg   SpO2 99%   BMI 24.02 kg/m  Discharge exam:  Const: Awake, alert in NAD HENT: Normocephalic, atraumatic, mucus membranes moist Eyes: PERRL Card: RRR, No MRG, No pitting edema on LE's bilaterally. No reproducible chest pain.  Resp: LCTAB, no increased work of breathing Abd: Soft, NTND, Extremities: Warm, pink   Pertinent Labs, Studies, and Procedures:     Latest Ref Rng & Units 05/24/2024    4:40 AM 05/23/2024    4:12 AM 05/22/2024    4:09 AM  CBC  WBC 4.0 - 10.5 K/uL 5.1  6.3  6.1   Hemoglobin 13.0 - 17.0 g/dL 87.3  87.1  88.9   Hematocrit 39.0 - 52.0 % 38.0  39.5  33.0  Platelets 150 - 400 K/uL 237  252  219        Latest Ref Rng & Units 05/24/2024    4:40 AM 05/23/2024    4:12 AM 05/22/2024    4:09 AM  CMP  Glucose 70 - 99 mg/dL 899  92  899   BUN 6 - 20 mg/dL 6  <5  5   Creatinine 9.38 - 1.24 mg/dL 9.38  9.35  9.24   Sodium 135 - 145 mmol/L 136  139  137   Potassium 3.5 - 5.1 mmol/L 3.6  3.4  3.6   Chloride 98 - 111 mmol/L 103  102  107   CO2 22 - 32 mmol/L 22  22  24    Calcium  8.9 - 10.3 mg/dL 9.4  9.7  8.1     DG ESOPHAGUS W SINGLE CM (SOL OR THIN BA) Result Date: 05/21/2024 CLINICAL DATA:  53 year old male with choking sensation on solid foods for diagnostic esophagram. EXAM: ESOPHAGUS/BARIUM SWALLOW/TABLET STUDY TECHNIQUE: Single contrast examination was performed using thin liquid barium. This exam was performed by [name], [name], and was supervised and interpreted by [name]. FLUOROSCOPY: Radiation Exposure Index (as provided by the fluoroscopic device): 55.7 mGy  Kerma COMPARISON:  None Available. FINDINGS: Swallowing: Appears normal. No vestibular penetration or aspiration seen. Pharynx: Unremarkable. Esophagus: Normal appearance, no esophageal lesions or strictures visualized. Esophageal motility: Tertiary contractions visualized. Hiatal Hernia: Small hiatal hernia visualized. Gastroesophageal reflux: None visualized, even with provocative maneuvers. Ingested 13mm barium tablet: Became lodged at the gastroesophageal junction. 13 mm barium tablet would not pass even with subsequent sips of water, swallows, and barium sips. [name] Other: Patient was unable to stand leading to limited exam. IMPRESSION: Small hiatal hernia and tertiary contractions visualized. Barium tablet became stuck at gastroesophageal junction. Otherwise normal esophagram. Performed By Lavanda Augusta, [name] Electronically Signed   By: CHRISTELLA.  Shick M.D.   On: 05/21/2024 16:35   CT CORONARY MORPH W/CTA COR W/SCORE W/CA W/CM &/OR WO/CM Addendum Date: 05/21/2024 ADDENDUM REPORT: 05/21/2024 16:21 EXAM: OVER-READ INTERPRETATION  CT CHEST The following report is an over-read performed by radiologist Dr. Manford Breaker [facility] on 05/21/2024. This over-read does not include interpretation of cardiac or coronary anatomy or pathology. The coronary CTA interpretation by the cardiologist is attached. COMPARISON:  CTA chest dated 05/08/2024, 05/03/2021 FINDINGS: Cardiovascular: Normal appearance of extracardiac vascular structures. Mediastinum/Nodes: Normal esophagus. No pathologically enlarged mediastinal or hilar lymph nodes. Lungs/Pleura: The imaged central airways are patent. 3 mm right middle lobe nodule (11:11) is unchanged dating back to at least 05/03/2021, likely benign. No focal consolidation. No pneumothorax. No pleural effusion. Upper abdomen: Normal. Musculoskeletal: No acute or abnormal lytic or blastic osseous lesions. IMPRESSION: No acute or  significant extracardiac abnormalities. Electronically Signed   By: Limin  Xu M.D.   On: 05/21/2024 16:21   Result Date: 05/21/2024 HISTORY: 53 yo male with chest pain/anginal equiv, ECGs or troponins abnormal EXAM: Cardiac/Coronary CTA TECHNIQUE: The patient was scanned on a [employer]. PROTOCOL: A 100 kV prospective scan was triggered in the descending thoracic aorta at 111 HU's. Axial non-contrast 3 mm slices were carried out through the heart. The data set was analyzed on a dedicated work station and scored using the Agatson method. Gantry rotation speed was 250 msecs and collimation was .6 mm. Beta blockade and 0.8 mg of sl NTG was given. The 3D data set was reconstructed in 5% intervals of the  35-75 % of the R-R cycle. Diastolic phases were analyzed on a dedicated work station using MPR, MIP and VRT modes. The patient received 100mL OMNIPAQUE  IOHEXOL  350 MG/ML SOLN contrast. FINDINGS: Quality: Good, HR 73, mis-registration artifact Coronary calcium  score: The patient's coronary artery calcium  score is 0. Coronary arteries: Normal coronary origins.  Right dominance. Right Coronary Artery: Dominant. No disease. Normal R-PLB and R-PDA branches. Left Main Coronary Artery: Normal. Bifurcates into the LAD and LCx arteries. Left Anterior Descending Coronary Artery: Large anterior artery that wraps around the apex. No disease. Large D1 and D2 branches, no disease. Left Circumflex Artery: AV groove vessel, no disease. Aorta: Normal size, 31 mm at the mid ascending aorta (level of the PA bifurcation) measured double oblique. No calcifications. No dissection. Aortic Valve: Trileaflet. No calcifications. Other findings: Normal pulmonary vein drainage into the left atrium. Normal left atrial appendage without a thrombus. Normal size of the pulmonary artery. IMPRESSION: 1. No evidence of CAD, CADRADS = 0. 2. Coronary artery calcium  score is 0. 3. Normal coronary origin with right dominance. 4. Consider  non-coronary causes of chest pain. Electronically Signed: By: Vinie JAYSON Maxcy M.D. On: 05/21/2024 14:47   ECHOCARDIOGRAM COMPLETE Result Date: 05/20/2024    ECHOCARDIOGRAM REPORT   Patient Name:   Xavier Owens Date of Exam: 05/20/2024 Medical Rec #:  [id number]        Height:       68.0 in Accession #:    [id number]       Weight:       158.0 lb Date of Birth:  [birth year]        BSA:          1.849 m Patient Age:    53 years         BP:           122/71 mmHg Patient Gender: M                HR:           79 bpm. Exam Location:  Inpatient Procedure: 2D Echo, Cardiac Doppler and Color Doppler (Both Spectral and Color            Flow Doppler were utilized during procedure). Indications:    Chest Pain R07.9  History:        Patient has no prior history of Echocardiogram examinations.  Sonographer:    Tinnie Orion RDCS Referring Phys: [id number] XIKA ZHAO IMPRESSIONS  1. Left ventricular ejection fraction, by estimation, is 60 to 65%. The left ventricle has normal function. The left ventricle has no regional wall motion abnormalities. There is mild left ventricular hypertrophy. Left ventricular diastolic parameters were normal.  2. Right ventricular systolic function is normal. The right ventricular size is normal.  3. The mitral valve is normal in structure. No evidence of mitral valve regurgitation. No evidence of mitral stenosis.  4. The aortic valve was not well visualized. Aortic valve regurgitation is not visualized. No aortic stenosis is present.  5. The inferior vena cava is normal in size with greater than 50% respiratory variability, suggesting right atrial pressure of 3 mmHg. FINDINGS  Left Ventricle: Left ventricular ejection fraction, by estimation, is 60 to 65%. The left ventricle has normal function. The left ventricle has no regional wall motion abnormalities. Strain was performed and the global longitudinal strain is indeterminate. The left ventricular internal cavity size was normal in size. There is  mild left ventricular hypertrophy. Left ventricular diastolic parameters were normal. Right  Ventricle: The right ventricular size is normal. No increase in right ventricular wall thickness. Right ventricular systolic function is normal. Left Atrium: Left atrial size was normal in size. Right Atrium: Right atrial size was normal in size. Pericardium: There is no evidence of pericardial effusion. Mitral Valve: The mitral valve is normal in structure. No evidence of mitral valve regurgitation. No evidence of mitral valve stenosis. Tricuspid Valve: The tricuspid valve is normal in structure. Tricuspid valve regurgitation is not demonstrated. No evidence of tricuspid stenosis. Aortic Valve: The aortic valve was not well visualized. Aortic valve regurgitation is not visualized. No aortic stenosis is present. Pulmonic Valve: The pulmonic valve was normal in structure. Pulmonic valve regurgitation is not visualized. No evidence of pulmonic stenosis. Aorta: The aortic root is normal in size and structure. Venous: The inferior vena cava is normal in size with greater than 50% respiratory variability, suggesting right atrial pressure of 3 mmHg. IAS/Shunts: No atrial level shunt detected by color flow Doppler. Additional Comments: 3D was performed not requiring image post processing on an independent workstation and was indeterminate.  LEFT VENTRICLE PLAX 2D LVIDd:         4.10 cm   Diastology LVIDs:         2.50 cm   LV e' medial:    6.96 cm/s LV PW:         1.30 cm   LV E/e' medial:  10.0 LV IVS:        1.20 cm   LV e' lateral:   10.90 cm/s LVOT diam:     2.10 cm   LV E/e' lateral: 6.4 LV SV:         63 LV SV Index:   34 LVOT Area:     3.46 cm  RIGHT VENTRICLE             IVC RV S prime:     12.50 cm/s  IVC diam: 0.90 cm TAPSE (M-mode): 2.6 cm LEFT ATRIUM           Index        RIGHT ATRIUM           Index LA diam:      3.10 cm 1.68 cm/m   RA Area:     10.90 cm LA Vol (A2C): 49.8 ml 26.93 ml/m  RA Volume:   20.20 ml   10.93 ml/m LA Vol (A4C): 18.0 ml 9.74 ml/m  AORTIC VALVE LVOT Vmax:   95.00 cm/s LVOT Vmean:  66.500 cm/s LVOT VTI:    0.183 m  AORTA Ao Root diam: 3.30 cm Ao Asc diam:  3.50 cm MITRAL VALVE MV Area (PHT): 4.96 cm    SHUNTS MV Decel Time: 153 msec    Systemic VTI:  0.18 m MV E velocity: 69.70 cm/s  Systemic Diam: 2.10 cm MV A velocity: 73.40 cm/s MV E/A ratio:  0.95 Maude Emmer MD Electronically signed by Maude Emmer MD Signature Date/Time: 05/20/2024/3:14:42 PM    Final    DG Chest 2 View Result Date: 05/20/2024 CLINICAL DATA:  Chest pain. EXAM: CHEST - 2 VIEW COMPARISON:  05/08/2024. FINDINGS: Low lung volume. Bilateral lung fields are clear. Bilateral costophrenic angles are clear. Normal cardio-mediastinal silhouette. No acute osseous abnormalities. The soft tissues are within normal limits. IMPRESSION: No active cardiopulmonary disease. Electronically Signed   By: Ree Molt M.D.   On: 05/20/2024 10:20     Discharge Instructions: Discharge Instructions     Call MD for:  difficulty breathing,  headache or visual disturbances   Complete by: As directed    Call MD for:  persistant nausea and vomiting   Complete by: As directed    Call MD for:  severe uncontrolled pain   Complete by: As directed    Call MD for:  temperature >100.4   Complete by: As directed    Discharge instructions   Complete by: As directed    Thank you for allowing us  to be part of your care. You were hospitalized for chest pain. We treated you with tylenol , pantoprazole , and an EGD procedure.   See the changes in your medications and management of your chronic conditions below: *For your chest pain -We have STARTED you on these following medications:  -atorvastatin  40 mg daily  -lidocaine  5% patches: place one on skin daily  -We have CONTINUED the following medications:              -pantoprazole  40 mg daily  -We have CHANGED the following medications:              -diclofenac  sodium 1% gel: apply 2 g  topically every 6 hours         *For your blood pressure  -We have CHANGED the following medications to these new doses:   - trazodone  50 mg daily at bedtime  - seroquel  50 mg daily at bedtime   -We have STOPPED the following medications:  -propanolol 10 mg  -gabapentin  300 mg  -Please see your PCP in 7 to 10 days  FOLLOW UP APPOINTMENTS: We arranged for you to follow up with your family doctor at: [facility] Please visit the [facility] on 05/28/2024 at 2:45 pm  Please make sure to to arrange a colonoscopy with [facility] at the phone number: [phone]  Please call your PCP or our clinic if you have any questions or concerns, we may be able to help and keep you from a long and expensive emergency room wait. Our clinic and after hours phone number is [phone]. The best time to call is Monday through Friday 9 am to 4 pm but there is always someone available 24/7 if you have an emergency. If you need medication refills please notify your pharmacy one week in advance and they will send us  a request.   We are glad you are feeling better,  Brad Prey Schuyler Novak, DO Internal Medicine Inpatient Teaching Service at [facility]   Increase activity slowly   Complete by: As directed        Signed: Novak Schuyler, DO 05/24/2024, 1:19 PM

## 2024-05-27 ENCOUNTER — Other Ambulatory Visit (HOSPITAL_COMMUNITY)
Admission: EM | Admit: 2024-05-27 | Payer: MEDICAID | Source: Intra-hospital | Attending: Psychiatry | Admitting: Psychiatry

## 2024-05-27 ENCOUNTER — Ambulatory Visit (HOSPITAL_COMMUNITY)
Admission: EM | Admit: 2024-05-27 | Discharge: 2024-05-27 | Disposition: A | Discharge status: Other Acute Inpatient Hospital | Payer: MEDICAID

## 2024-05-27 DIAGNOSIS — Z87898 Personal history of other specified conditions: Secondary | ICD-10-CM

## 2024-05-27 DIAGNOSIS — F331 Major depressive disorder, recurrent, moderate: Secondary | ICD-10-CM | POA: Insufficient documentation

## 2024-05-27 DIAGNOSIS — E785 Hyperlipidemia, unspecified: Secondary | ICD-10-CM | POA: Diagnosis not present

## 2024-05-27 DIAGNOSIS — F1010 Alcohol abuse, uncomplicated: Secondary | ICD-10-CM | POA: Insufficient documentation

## 2024-05-27 DIAGNOSIS — K219 Gastro-esophageal reflux disease without esophagitis: Secondary | ICD-10-CM | POA: Diagnosis not present

## 2024-05-27 DIAGNOSIS — F1090 Alcohol use, unspecified, uncomplicated: Secondary | ICD-10-CM | POA: Diagnosis present

## 2024-05-27 DIAGNOSIS — F1020 Alcohol dependence, uncomplicated: Secondary | ICD-10-CM

## 2024-05-27 DIAGNOSIS — Z8659 Personal history of other mental and behavioral disorders: Secondary | ICD-10-CM

## 2024-05-27 MED ORDER — ALUM & MAG HYDROXIDE-SIMETH 200-200-20 MG/5ML PO SUSP: 30.0000 mL | ORAL | Status: DC | PRN

## 2024-05-27 MED ORDER — OLANZAPINE 5 MG PO TBDP: 5.0000 mg | ORAL_TABLET | Freq: Three times a day (TID) | ORAL | Status: DC | PRN

## 2024-05-27 MED ORDER — OLANZAPINE 10 MG IM SOLR: 10.0000 mg | Freq: Three times a day (TID) | INTRAMUSCULAR | Status: DC | PRN

## 2024-05-27 MED ORDER — MAGNESIUM HYDROXIDE 400 MG/5ML PO SUSP: 30.0000 mL | Freq: Every day | ORAL | Status: DC | PRN

## 2024-05-27 MED ORDER — ACETAMINOPHEN 325 MG PO TABS: 650.0000 mg | ORAL_TABLET | Freq: Four times a day (QID) | ORAL | Status: DC | PRN

## 2024-05-27 MED ORDER — OLANZAPINE 10 MG IM SOLR: 5.0000 mg | Freq: Three times a day (TID) | INTRAMUSCULAR | Status: DC | PRN

## 2024-05-27 NOTE — BH Assessment (Signed)
 Comprehensive Clinical Assessment (CCA) Note  05/27/2024 Victory Butler [id number]  Chief Complaint:  Chief Complaint  Patient presents with   Alcohol Problem  Disposition: Per Gaither Trudy PIETY patient is recommended for admission to [facility].  The patient demonstrates the following risk factors for suicide: Chronic risk factors for suicide include: psychiatric disorder of MDD and substance use disorder. Acute risk factors for suicide include: N/A. Protective factors for this patient include: hope for the future. Considering these factors, the overall suicide risk at this point appears to be low. Patient is not appropriate for outpatient follow up.   Patient is a 53 year old male with a history of Alcohol use disorder, MDD,seizures who presents voluntarily to [facility] for substance use treatment and detox. Patient resides in the home with his wife and identifies her as their primary support system.Patient reports hopelessness, guilt, worthlessness, and unstable sleeping pattern. Patient has a hx of Substance Abuse:alcohol.  Last use was yesterday 1/5th of liquor, he reports daily alcohol consumption. Patient reports withdrawal symptoms today, vomiting and nausea.Patient denies NSSIB, SI, HI, AVH.  Patient identifies his primary stressors as ongoing alcohol abuse and ongoing grief from his grandmothers death in 02/02/2024.Patient denies history of abuse or trauma. Patient denies current legal problems. Patient is not receiving outpatient therapy but reports medication management through his PCP Dr. Nguyen. Patient reports previous inpatient admission for substance use.  Patient denies access to weapons.      Visit Diagnosis:  Alcohol use disorder    CCA Screening, Triage and Referral (STR)  Patient Reported Information How did you hear about us ? Self  What Is the Reason for Your Visit/Call Today? Per triage note Pt presents to [facility] unaccompanied. Pt states he is having  withdrawls from alcohol starting this morning. Pt reports that he drank yesterday and all weekend. Pt mentions he had a fifth of alcohol yesterday. Pt mentions he is drinking every weekend. Pt denies drug use, Si, Hi and AVH.  How Long Has This Been Causing You Problems? <Week  What Do You Feel Would Help You the Most Today? Medication(s); Alcohol or Drug Use Treatment   Have You Recently Had Any Thoughts About Hurting Yourself? No  Are You Planning to Commit Suicide/Harm Yourself At This time? No   Flowsheet Row ED from 05/27/2024 in [facility] Most recent reading at 05/27/2024 10:20 PM ED from 05/27/2024 in [facility] Most recent reading at 05/27/2024  6:22 PM ED to Hosp-Admission (Discharged) from 05/20/2024 in [place] [facility] Most recent reading at 05/20/2024 10:08 AM  C-SSRS RISK CATEGORY No Risk No Risk No Risk    Have you Recently Had Thoughts About Hurting Someone Sherral? No  Are You Planning to Harm Someone at This Time? No  Explanation: n/a   Have You Used Any Alcohol or Drugs in the Past 24 Hours? Yes  How Long Ago Did You Use Drugs or Alcohol? On Tuesday (01/16/2024)  What Did You Use and How Much? 1/5th of liquor , yesterday   Do You Currently Have a Therapist/Psychiatrist? No  Name of Therapist/Psychiatrist:    Have You Been Recently Discharged From Any Office Practice or Programs? No  Explanation of Discharge From Practice/Program: n/a    CCA Screening Triage Referral Assessment Type of Contact: Face-to-Face  Telemedicine Service Delivery:   Is this Initial or Reassessment?   Date Telepsych consult ordered in [facility]:    Time Telepsych consult ordered in [facility]:  Location of Assessment: [facility] Assessment Services  Provider Location: [facility] Assessment Services   Collateral Involvement: None.   Does Patient Have a Automotive engineer Guardian? No  Legal Guardian Contact Information:  n/a  Copy of Legal Guardianship Form: -- (n/a)  Legal Guardian Notified of Arrival: -- (n/a)  Legal Guardian Notified of Pending Discharge: -- (n/a)  If Minor and Not Living with Parent(s), Who has Custody? n/a  Is CPS involved or ever been involved? Never  Is APS involved or ever been involved? Never   Patient Determined To Be At Risk for Harm To Self or Others Based on Review of Patient Reported Information or Presenting Complaint? No  Method: No Plan  Availability of Means: No access or NA  Intent: Vague intent or NA  Notification Required: No need or identified person  Additional Information for Danger to Others Potential: -- (n/a)  Additional Comments for Danger to Others Potential: None.  Are There Guns or Other Weapons in Your Home? Yes  Types of Guns/Weapons: Pt reports, he has guns in his home but they are secured by his wife  Are These Weapons Safely Secured?                            Yes (per his report)  Who Could Verify You Are Able To Have These Secured: n/a  Do You Have any Outstanding Charges, Pending Court Dates, Parole/Probation? Denies  Contacted To Inform of Risk of Harm To Self or Others: Other: Comment (n/a)    Does Patient Present under Involuntary Commitment? No    [place] of Residence: [place]   Patient Currently Receiving the Following Services: Medication Management   Determination of Need: Urgent (48 hours)   Options For Referral: Facility-Based Crisis     CCA Biopsychosocial Patient Reported Schizophrenia/Schizoaffective Diagnosis in Past: No   Strengths: Pt wants to get his medications addressed.   Mental Health Symptoms Depression:  Increase/decrease in appetite; Sleep (too much or little); Irritability; Hopelessness; Worthlessness   Duration of Depressive symptoms: Duration of Depressive Symptoms: Greater than two weeks   Mania:  None   Anxiety:   Worrying; Restlessness; Tension   Psychosis:  None    Duration of Psychotic symptoms:    Trauma:  None   Obsessions:  None   Compulsions:  None   Inattention:  N/A   Hyperactivity/Impulsivity:  N/A   Oppositional/Defiant Behaviors:  Angry   Emotional Irregularity:  Recurrent suicidal behaviors/gestures/threats; Potentially harmful impulsivity   Other Mood/Personality Symptoms:  None.    Mental Status Exam Appearance and self-care  Stature:  Average   Weight:  Average weight   Clothing:  Casual   Grooming:  Normal   Cosmetic use:  None   Posture/gait:  Normal   Motor activity:  Not Remarkable   Sensorium  Attention:  Normal   Concentration:  Normal   Orientation:  X5   Recall/memory:  Normal   Affect and Mood  Affect:  Anxious   Mood:  Anxious   Relating  Eye contact:  Normal   Facial expression:  Responsive   Attitude toward examiner:  Cooperative   Thought and Language  Speech flow: Normal   Thought content:  Appropriate to Mood and Circumstances   Preoccupation:  None   Hallucinations:  None   Organization:  Coherent   [employer] of Knowledge:  Fair   Intelligence:  Average   Abstraction:  Functional  Judgement:  Poor   Reality Testing:  Adequate   Insight:  Fair   Decision Making:  Impulsive   Social Functioning  Social Maturity:  Impulsive   Social Judgement:  Chief of Staff   Stress  Stressors:  Grief/losses; Other (Comment) (Drinking.)   Coping Ability:  Overwhelmed   Skill Deficits:  Decision making; Self-control   Supports:  Family     Religion: Religion/Spirituality Are You A Religious Person?: No How Might This Affect Treatment?: None.  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?: No  Education: Education Is Patient Currently Attending School?: No Last Grade Completed: 11 Did You Product manager?: No Did You Have An Individualized Education Program (IIEP): No Did You Have Any Difficulty At [employer]?: No Patient's Education Has Been Impacted by Current Illness: No   CCA Family/Childhood History Family and Relationship History: Family history Marital status: Married Number of Years Married: 15 What types of issues is patient dealing with in the relationship?: Denies Additional relationship information: Reports he has been married 15 years and lives with his wife Does patient have children?: Yes How many children?: 1 How is patient's relationship with their children?: Pt reports, he has one daughter, from previous relationship. Per chart he previously reported he had 2 daughters.  Childhood History:  Childhood History By whom was/is the patient raised?: Mother, Father Did patient suffer any verbal/emotional/physical/sexual abuse as a child?: No Did patient suffer from severe childhood neglect?: No Has patient ever been sexually abused/assaulted/raped as an adolescent or adult?: No Was the patient ever a victim of a crime or a disaster?: No Witnessed domestic violence?: No Has patient been affected by domestic violence as an adult?: No       CCA Substance Use Alcohol/Drug Use: Alcohol / Drug Use Pain  Medications: See MAR Prescriptions: See MAR Over the Counter: See MAR History of alcohol / drug use?: Yes Longest period of sobriety (when/how long): Unsure. Negative Consequences of Use:  (Unsure.) Withdrawal Symptoms: Seizures, Irritability, Nausea / Vomiting Onset of Seizures: Unknown Date of most recent seizure: sometime this year, per his report                         ASAM's:  Six Dimensions of Multidimensional Assessment  Dimension 1:  Acute Intoxication and/or Withdrawal Potential:   Dimension 1:  Description of individual's past and current experiences of substance use and withdrawal: Reports ongoing use for the past 30 years  Dimension 2:  Biomedical Conditions and Complications:   Dimension 2:  Description of patient's biomedical conditions and  complications: Per chart, pt has a previous diagnosis of: Subclinical hypothyroidism. Pt reports, he was hospitalized previously for seizures.  Dimension 3:  Emotional, Behavioral, or Cognitive Conditions and Complications:  Dimension  3:  Description of emotional, behavioral, or cognitive conditions and complications: Per chart, pt has a previous diagnosis of: Alcohol use disorder, severe, dependence (HCC) and Substance induced mood disorder (HCC).  Dimension 4:  Readiness to Change:  Dimension 4:  Description of Readiness to Change criteria: Pt is seeking help.  Dimension 5:  Relapse, Continued use, or Continued Problem Potential:  Dimension 5:  Relapse, continued use, or continued problem potential critiera description: Continue use despite medical concerns  Dimension 6:  Recovery/Living Environment:  Dimension 6:  Recovery/Iiving environment criteria description: Pt reports, he lives with his wife.  ASAM Severity Score: ASAM's Severity Rating Score: 7  ASAM Recommended Level of Treatment: ASAM Recommended Level of Treatment: Level I Outpatient Treatment   Substance use Disorder (SUD) Substance Use Disorder (SUD)   Checklist Symptoms of Substance Use: Continued use despite persistent or recurrent social, interpersonal problems, caused or exacerbated by use, Evidence of withdrawal (Comment), Presence of craving or strong urge to use, Persistent desire or unsuccessful efforts to cut down or control use, Continued use despite having a persistent/recurrent physical/psychological problem caused/exacerbated by use (Clinician observed tremors.)  Recommendations for Services/Supports/Treatments: Recommendations for Services/Supports/Treatments Recommendations For Services/Supports/Treatments: Inpatient Hospitalization, Individual Therapy, Medication Management, Facility Based Crisis  Disposition Recommendation per psychiatric provider: Per Gaither Trudy PIETY patient is recommended for admission to [facility].   DSM5 Diagnoses: Patient Active Problem List   Diagnosis Date Noted   Schatzki's ring 05/23/2024   Encounter to discuss test results 05/22/2024   Non-cardiac chest pain 05/22/2024   Esophageal dysphagia 05/22/2024   Abnormal loss of weight 05/22/2024   Abnormal finding on GI tract imaging 05/22/2024   Chest pain 05/21/2024   Elevated troponin 05/21/2024   Hypotension 05/21/2024   Chest discomfort 05/20/2024   Electrolyte abnormality 05/20/2024   Alcohol withdrawal (HCC) 05/09/2024   AKI (acute kidney injury) (HCC) 05/09/2024   Leukocytosis 05/09/2024   Heat exhaustion, water deprivation 05/09/2024   Major depression, recurrent (HCC) 04/03/2024   Reactive depression 03/22/2024   Alcohol withdrawal syndrome with complication (HCC) 03/22/2024   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   Dehydration 03/21/2024   Acute lactic acidosis 03/21/2024   Delirium tremens (HCC) 03/18/2024   Nausea & vomiting 03/16/2024   Alcohol abuse 03/16/2024   Tremor 03/16/2024   Neuropathy 03/16/2024   Mood disorder (HCC) 03/16/2024   Memory impairment 02/08/2024   Right knee pain 02/08/2024   Major depressive disorder,  recurrent severe without psychotic features (HCC) 01/25/2024   Alcohol withdrawal seizure (HCC) 01/23/2024   Alcohol use disorder, severe, dependence (HCC) 01/18/2024   Healthcare maintenance 06/10/2022   Nightmares 12/21/2021   Subclinical hypothyroidism 12/21/2021   Substance induced mood disorder (HCC) 12/14/2021   Alcohol use disorder, moderate, dependence (HCC) 06/23/2021   Seizures (HCC) 06/22/2021   Elevated liver enzymes 04/19/2016   Essential hypertension 04/19/2016     Referrals to Alternative Service(s): Referred to Alternative Service(s):   Place:   Date:   Time:    Referred to Alternative Service(s):   Place:   Date:   Time:    Referred to Alternative Service(s):   Place:   Date:   Time:    Referred to Alternative Service(s):   Place:   Date:   Time:     Irving Lubbers C Marites Nath, LCMHCA

## 2024-05-27 NOTE — Progress Notes (Signed)
   05/27/24 1808  [facility]UC Triage Screening (Walk-ins at [facility] only)  How Did You Hear About Us ? Self  What Is the Reason for Your Visit/Call Today? Pt presents to [facility] unaccompanied. Pt states he is having withdrawls from alcohol starting this morning. Pt reports that he drank yesterday and all weekend. Pt mentions he had a fifth of alcohol yesterday. Pt mentions he is drinking every weekend. Pt denies drug use, Si, Hi and AVH.  How Long Has This Been Causing You Problems? <Week  Have You Recently Had Any Thoughts About Hurting Yourself? No  Are You Planning to Commit Suicide/Harm Yourself At This time? No  Have you Recently Had Thoughts About Hurting Someone Sherral? No  Are You Planning To Harm Someone At This Time? No  Physical Abuse Denies  Verbal Abuse Denies  Sexual Abuse Denies  Exploitation of patient/patient's resources Denies  Self-Neglect Denies  Possible abuse reported to: Other (Comment)  Are you currently experiencing any auditory, visual or other hallucinations? No  Have You Used Any Alcohol or Drugs in the Past 24 Hours? Yes  What Did You Use and How Much? a fifth of alcohol  Do you have any current medical co-morbidities that require immediate attention? No  Clinician description of patient physical appearance/behavior: calm, cooperative  What Do You Feel Would Help You the Most Today? Medication(s);Alcohol or Drug Use Treatment  If access to [facility] was not available, would you have sought care in the Emergency Department? No  Determination of Need Urgent (48 hours)  Options For Referral Outpatient Therapy;Facility-Based Crisis  Determination of Need filed? Yes

## 2024-05-27 NOTE — ED Provider Notes (Signed)
 Facility Based Crisis Admission H&P  Date: 05/28/24 Patient Name: Xavier Owens MRN: [id number] Chief Complaint: looking for detox   Diagnoses:  Final diagnoses:  Alcohol abuse  Depressed affect    HPI: Xavier Owens, 53 y/o male with a history of alcohol abuse, SI, presented to [facility] while anteriorly.  Per the patient he is trying to get detox from alcohol usage.  According to him he relapsed after leaving here the last time.  Review of patient records show multiple admissions for alcohol dependency.  Per the patient he was having withdrawal this morning when asked what withdrawal was he having he stated he vomited.  Patient reports he has been drinking a lot over the weekend and stating he drank 1/5 of liquor but he does not drink beer.  Per the patient he is self-employed, currently lives with his wife.  Face-to-face evaluation of patient, patient is alert and oriented x 4, speech is clear, maintaining eye contact.  Patient denies SI, HI, AVH or paranoia.  Denies illicit drug use.  Reports he drinks 1/5 of liquor daily.  Patient denies access to guns denied wanting to hurt himself or others.  At this present moment patient does not seem to be influenced by internal stimuli.  Patient does not appear to show any sign of withdrawal however patient stated he vomited this morning.  Given patient prior history and current presentation.  Writer discussed with patient the admission process to [facility] for detox  PHQ-9 completed patient scored a 12 Recommend FBC unit   PHQ 2-9:  Flowsheet Row ED from 05/27/2024 in [facility] Office Visit from 04/22/2024 in [facility] ED from 01/18/2024 in [facility]  Thoughts that you would be better off dead, or of hurting yourself in some way Not at all Not at all Not at all  PHQ-9 Total Score 12 0 3    Flowsheet Row ED from 05/27/2024 in [facility] Most recent reading at 05/27/2024 11:10 PM ED from 05/27/2024 in [facility] Most recent reading at 05/27/2024  6:22 PM ED to Hosp-Admission (Discharged) from 05/20/2024 in [place] [facility] Most recent reading at 05/20/2024 10:08 AM  C-SSRS RISK CATEGORY No Risk No Risk No Risk      Total Time spent with patient: 20 minutes  Musculoskeletal  Strength & Muscle Tone: within normal limits Gait & Station: normal Patient leans: N/A  Psychiatric Specialty Exam  Presentation General Appearance:  Casual  Eye Contact: Good  Speech: Clear and Coherent  Speech Volume: Normal  Handedness: Right   Mood and Affect  Mood: Anxious  Affect: Congruent   Thought Process  Thought Processes: Coherent  Descriptions of Associations:Intact  Orientation:Full (Time, Place and Person)  Thought Content:Logical  Diagnosis of Schizophrenia or Schizoaffective disorder in past: No   Hallucinations:Hallucinations: None  Ideas of Reference:None  Suicidal Thoughts:Suicidal Thoughts: No  Homicidal Thoughts:Homicidal Thoughts: No   Sensorium  Memory: Immediate Fair  Judgment: Fair  Insight: Fair   Art therapist  Concentration: Fair  Attention Span: Fair  Recall: [employer] of Knowledge: Fair  Language: Fair   Psychomotor Activity  Psychomotor Activity: Psychomotor Activity: Normal   Assets  Assets: Desire for Improvement; Resilience   Sleep  Sleep: Sleep: Fair Number of Hours of Sleep: 8   Nutritional Assessment (For OBS and FBC admissions only) Has the patient had a weight loss or gain of 10 pounds  or more in the last 3 months?: No Has the patient had a decrease in food intake/or appetite?: No Does the patient have dental problems?: No Does the patient have eating habits or behaviors that may be indicators of an eating disorder including binging or inducing vomiting?: No Has the patient recently  lost weight without trying?: 0 Has the patient been eating poorly because of a decreased appetite?: 0 Malnutrition Screening Tool Score: 0    Physical Exam HENT:     Head: Normocephalic.     Nose: Nose normal.  Eyes:     Pupils: Pupils are equal, round, and reactive to light.  Cardiovascular:     Rate and Rhythm: Normal rate.  Pulmonary:     Effort: Pulmonary effort is normal.  Musculoskeletal:        General: Normal range of motion.     Cervical back: Normal range of motion.  Neurological:     General: No focal deficit present.     Mental Status: He is alert.  Psychiatric:        Mood and Affect: Mood normal.        Behavior: Behavior normal.        Thought Content: Thought content normal.        Judgment: Judgment normal.    Review of Systems  Constitutional: Negative.   HENT: Negative.    Eyes: Negative.   Respiratory: Negative.    Cardiovascular: Negative.   Gastrointestinal: Negative.   Genitourinary: Negative.   Musculoskeletal: Negative.   Skin: Negative.   Neurological: Negative.   Psychiatric/Behavioral:  Positive for substance abuse. The patient is nervous/anxious.     There were no vitals taken for this visit. There is no height or weight on file to calculate BMI.  Past Psychiatric History: Alcohol abuse, SI  Is the patient at risk to self? No  Has the patient been a risk to self in the past 6 months? No .    Has the patient been a risk to self within the distant past? No   Is the patient a risk to others? No   Has the patient been a risk to others in the past 6 months? No   Has the patient been a risk to others within the distant past? No   Past Medical History: See chart Family History: Unknown Social History: Alcohol abuse  Last Labs:  Admission on 05/27/2024  Component Date Value Ref Range Status   WBC 05/27/2024 8.2  4.0 - 10.5 K/uL Final   RBC 05/27/2024 4.86  4.22 - 5.81 MIL/uL Final   Hemoglobin 05/27/2024 13.9  13.0 - 17.0 g/dL Final    HCT 92/85/7974 41.7  39.0 - 52.0 % Final   MCV 05/27/2024 85.8  80.0 - 100.0 fL Final   MCH 05/27/2024 28.6  26.0 - 34.0 pg Final   MCHC 05/27/2024 33.3  30.0 - 36.0 g/dL Final   RDW 92/85/7974 14.3  11.5 - 15.5 % Final   Platelets 05/27/2024 346  150 - 400 K/uL Final   nRBC 05/27/2024 0.0  0.0 - 0.2 % Final   Neutrophils Relative % 05/27/2024 64  % Final   Neutro Abs 05/27/2024 5.3  1.7 - 7.7 K/uL Final   Lymphocytes Relative 05/27/2024 18  % Final   Lymphs Abs 05/27/2024 1.5  0.7 - 4.0 K/uL Final   Monocytes Relative 05/27/2024 16  % Final   Monocytes Absolute 05/27/2024 1.3 (H)  0.1 - 1.0 K/uL Final   Eosinophils Relative  05/27/2024 1  % Final   Eosinophils Absolute 05/27/2024 0.1  0.0 - 0.5 K/uL Final   Basophils Relative 05/27/2024 1  % Final   Basophils Absolute 05/27/2024 0.1  0.0 - 0.1 K/uL Final   Immature Granulocytes 05/27/2024 0  % Final   Abs Immature Granulocytes 05/27/2024 0.03  0.00 - 0.07 K/uL Final   Performed at [facility] Lab, [address], [place], [place] [address]   Sodium 05/27/2024 132 (L)  135 - 145 mmol/L Final   Potassium 05/27/2024 3.2 (L)  3.5 - 5.1 mmol/L Final   Chloride 05/27/2024 94 (L)  98 - 111 mmol/L Final   CO2 05/27/2024 21 (L)  22 - 32 mmol/L Final   Glucose, Bld 05/27/2024 97  70 - 99 mg/dL Final   Glucose reference range applies only to samples taken after fasting for at least 8 hours.   BUN 05/27/2024 7  6 - 20 mg/dL Final   Creatinine, Ser 05/27/2024 0.93  0.61 - 1.24 mg/dL Final   Calcium  05/27/2024 10.0  8.9 - 10.3 mg/dL Final   Total Protein 92/85/7974 7.9  6.5 - 8.1 g/dL Final   Albumin 92/85/7974 4.2  3.5 - 5.0 g/dL Final   AST 92/85/7974 47 (H)  15 - 41 U/L Final   ALT 05/27/2024 24  0 - 44 U/L Final   Alkaline Phosphatase 05/27/2024 120  38 - 126 U/L Final   Total Bilirubin 05/27/2024 0.9  0.0 - 1.2 mg/dL Final   GFR, Estimated 05/27/2024 >60  >60 mL/min Final   Comment: (NOTE) Calculated using the CKD-EPI Creatinine  Equation (2021)    Anion gap 05/27/2024 17 (H)  5 - 15 Final   Performed at [facility], [address] [place], [place] [address]   Alcohol, Ethyl (B) 05/27/2024 <15  <15 mg/dL Final   Comment: (NOTE) For medical purposes only. Performed at [facility] Lab, [address] [place], [place] [address]    TSH 05/27/2024 3.436  0.350 - 4.500 uIU/mL Final   Comment: Performed by a 3rd Generation assay with a functional sensitivity of <=0.01 uIU/mL. Performed at [facility], [address], [place], [place] [address]   Admission on 05/20/2024, Discharged on 05/24/2024  Component Date Value Ref Range Status   WBC 05/20/2024 15.9 (H)  4.0 - 10.5 K/uL Final   RBC 05/20/2024 4.91  4.22 - 5.81 MIL/uL Final   Hemoglobin 05/20/2024 13.8  13.0 - 17.0 g/dL Final   HCT 92/92/7974 42.8  39.0 - 52.0 % Final   MCV 05/20/2024 87.2  80.0 - 100.0 fL Final   MCH 05/20/2024 28.1  26.0 - 34.0 pg Final   MCHC 05/20/2024 32.2  30.0 - 36.0 g/dL Final   RDW 92/92/7974 14.2  11.5 - 15.5 % Final   Platelets 05/20/2024 353  150 - 400 K/uL Final   nRBC 05/20/2024 0.0  0.0 - 0.2 % Final   Performed at [facility], [address] [place], [place] [address]   Sodium 05/20/2024 135  135 - 145 mmol/L Final   Potassium 05/20/2024 3.3 (L)  3.5 - 5.1 mmol/L Final   Chloride 05/20/2024 99  98 - 111 mmol/L Final   CO2 05/20/2024 11 (L)  22 - 32 mmol/L Final   Glucose, Bld 05/20/2024 87  70 - 99 mg/dL Final   Glucose reference range applies only to samples taken after fasting for at least 8 hours.   BUN 05/20/2024 5 (  L)  6 - 20 mg/dL Final   Creatinine, Ser 05/20/2024 1.16  0.61 - 1.24 mg/dL Final   Calcium  05/20/2024 9.5  8.9 - 10.3 mg/dL Final   Total Protein 92/92/7974 7.9  6.5 - 8.1 g/dL Final   Albumin 92/92/7974 3.8  3.5 - 5.0 g/dL Final   AST 92/92/7974 52 (H)  15 - 41 U/L Final   ALT 05/20/2024 21  0 - 44 U/L Final   Alkaline Phosphatase 05/20/2024 97  38 - 126 U/L Final   Total  Bilirubin 05/20/2024 2.3 (H)  0.0 - 1.2 mg/dL Final   GFR, Estimated 05/20/2024 >60  >60 mL/min Final   Comment: (NOTE) Calculated using the CKD-EPI Creatinine Equation (2021)    Anion gap 05/20/2024 25 (H)  5 - 15 Corrected   Comment: REPEATED TO VERIFY Performed at [facility], [address], [place], [place] [address] CORRECTED ON 07/07 AT 1105: PREVIOUSLY REPORTED AS 25 CRITICAL RESULT CALLED TO, READ BACK BY AND VERIFIED WITH DEVOLT,TRAVIS RN 1047 05/20/24 AMIREHSNAIF REPEATED TO VERIFY    Lipase 05/20/2024 32  11 - 51 U/L Final   Performed at [facility], [address], [place], [place] [address]   Troponin I (High Sensitivity) 05/20/2024 131 (HH)  <18 ng/L Final   Comment: CRITICAL RESULT CALLED TO, READ BACK BY AND VERIFIED WITH DEVOLT,TRAVIS RN 1047 05/20/24 AMIREHSNAIF (NOTE) Elevated high sensitivity troponin I (hsTnI) values and significant  changes across serial measurements may suggest ACS but many other  chronic and acute conditions are known to elevate hsTnI results.  Refer to the Links section for chest pain algorithms and additional  guidance. Performed at [facility], [address], [place], [place] [address]    Troponin I (High Sensitivity) 05/20/2024 76 (H)  <18 ng/L Final   Comment: (NOTE) Elevated high sensitivity troponin I (hsTnI) values and significant  changes across serial measurements may suggest ACS but many other  chronic and acute conditions are known to elevate hsTnI results.  Refer to the Links section for chest pain algorithms and additional  guidance. Performed at [facility], [address], [place], [place] [address]    pH, Ven 05/20/2024 7.443 (H)  7.25 - 7.43 Final   pCO2, Ven 05/20/2024 22.4 (L)  44 - 60 mmHg Final   pO2, Ven 05/20/2024 71 (H)  32 - 45 mmHg Final   Bicarbonate 05/20/2024 15.3 (L)  20.0 - 28.0 mmol/L Final   TCO2 05/20/2024 16 (L)  22 - 32 mmol/L Final   O2 Saturation 05/20/2024 95  % Final    Acid-base deficit 05/20/2024 6.0 (H)  0.0 - 2.0 mmol/L Final   Sodium 05/20/2024 128 (L)  135 - 145 mmol/L Final   Potassium 05/20/2024 5.8 (H)  3.5 - 5.1 mmol/L Final   Calcium , Ion 05/20/2024 0.94 (L)  1.15 - 1.40 mmol/L Final   HCT 05/20/2024 48.0  39.0 - 52.0 % Final   Hemoglobin 05/20/2024 16.3  13.0 - 17.0 g/dL Final   Sample type 92/92/7974 VENOUS   Final   Color, Urine 05/20/2024 AMBER (A)  YELLOW Final   BIOCHEMICALS MAY BE AFFECTED BY COLOR   APPearance 05/20/2024 HAZY (A)  CLEAR Final   Specific Gravity, Urine 05/20/2024 1.026  1.005 - 1.030 Final   pH 05/20/2024 5.0  5.0 - 8.0 Final   Glucose, UA 05/20/2024 NEGATIVE  NEGATIVE mg/dL Final   Hgb urine dipstick 05/20/2024 SMALL (A)  NEGATIVE Final  Bilirubin Urine 05/20/2024 NEGATIVE  NEGATIVE Final   Ketones, ur 05/20/2024 80 (A)  NEGATIVE mg/dL Final   Protein, ur 92/92/7974 100 (A)  NEGATIVE mg/dL Final   Nitrite 92/92/7974 NEGATIVE  NEGATIVE Final   Leukocytes,Ua 05/20/2024 NEGATIVE  NEGATIVE Final   RBC / HPF 05/20/2024 0-5  0 - 5 RBC/hpf Final   WBC, UA 05/20/2024 0-5  0 - 5 WBC/hpf Final   Bacteria, UA 05/20/2024 RARE (A)  NONE SEEN Final   Squamous Epithelial / HPF 05/20/2024 0-5  0 - 5 /HPF Final   Mucus 05/20/2024 PRESENT   Final   Hyaline Casts, UA 05/20/2024 PRESENT   Final   Performed at [facility], [address], [place], [place] [address]   Osmolality 05/20/2024 279  275 - 295 mOsm/kg Final   Comment: REPEATED TO VERIFY Performed at [facility], [address], [place], [place] [address]    Weight 05/20/2024 2,528  oz Final   Height 05/20/2024 68  in Final   BP 05/20/2024 122/71  mmHg Final   S' Lateral 05/20/2024 2.50  cm Final   Area-P 1/2 05/20/2024 4.96  cm2 Final   Est EF 05/20/2024 60 - 65%   Final   Cholesterol 05/20/2024 208 (H)  0 - 200 mg/dL Final   Triglycerides 92/92/7974 168 (H)  <150 mg/dL Final   HDL 92/92/7974 79  >40 mg/dL Final   Total CHOL/HDL Ratio 05/20/2024 2.6   RATIO Final   VLDL 05/20/2024 34  0 - 40 mg/dL Final   LDL Cholesterol 05/20/2024 95  0 - 99 mg/dL Final   Comment:        Total Cholesterol/HDL:CHD Risk Coronary Heart Disease Risk Table                     Men   Women  1/2 Average Risk   3.4   3.3  Average Risk       5.0   4.4  2 X Average Risk   9.6   7.1  3 X Average Risk  23.4   11.0        Use the calculated Patient Ratio above and the CHD Risk Table to determine the patient's CHD Risk.        ATP III CLASSIFICATION (LDL):  <100     mg/dL   Optimal  899-870  mg/dL   Near or Above                    Optimal  130-159  mg/dL   Borderline  839-810  mg/dL   High  >809     mg/dL   Very High Performed at [facility], [address], [place], [place] [address]    Hgb A1c MFr Bld 05/20/2024 4.4 (L)  4.8 - 5.6 % Final   Comment: (NOTE) Diagnosis of Diabetes The following HbA1c ranges recommended by the American Diabetes Association (ADA) may be used as an aid in the diagnosis of diabetes mellitus.  Hemoglobin             Suggested A1C NGSP%              Diagnosis  <5.7                   Non Diabetic  5.7-6.4                Pre-Diabetic  >6.4  Diabetic  <7.0                   Glycemic control for                       adults with diabetes.     Mean Plasma Glucose 05/20/2024 79.58  mg/dL Final   Performed at [facility], [address] [place], [place] [address]   CRP 05/20/2024 3.3 (H)  <1.0 mg/dL Final   Performed at [facility], [address] [place], [place] [address]   Sed Rate 05/20/2024 4  0 - 16 mm/hr Final   Performed at [facility], [address] [place], [place] [address]   Alcohol, Ethyl (B) 05/20/2024 <15  <15 mg/dL Final   Comment: (NOTE) For medical purposes only. Performed at [facility], [address], [place], [place] [address]    Opiates 05/21/2024 NONE DETECTED  NONE DETECTED Final   Cocaine 05/21/2024 NONE DETECTED  NONE DETECTED Final    Benzodiazepines 05/21/2024 NONE DETECTED  NONE DETECTED Final   Amphetamines 05/21/2024 NONE DETECTED  NONE DETECTED Final   Tetrahydrocannabinol 05/21/2024 NONE DETECTED  NONE DETECTED Final   Barbiturates 05/21/2024 NONE DETECTED  NONE DETECTED Final   Comment: (NOTE) DRUG SCREEN FOR MEDICAL PURPOSES ONLY.  IF CONFIRMATION IS NEEDED FOR ANY PURPOSE, NOTIFY LAB WITHIN 5 DAYS.  LOWEST DETECTABLE LIMITS FOR URINE DRUG SCREEN Drug Class                     Cutoff (ng/mL) Amphetamine and metabolites    1000 Barbiturate and metabolites    200 Benzodiazepine                 200 Opiates and metabolites        300 Cocaine and metabolites        300 THC                            50 Performed at [facility] Lab, [address], [place], [place] [address]    Sodium 05/21/2024 134 (L)  135 - 145 mmol/L Final   Potassium 05/21/2024 3.1 (L)  3.5 - 5.1 mmol/L Final   Chloride 05/21/2024 103  98 - 111 mmol/L Final   CO2 05/21/2024 23  22 - 32 mmol/L Final   Glucose, Bld 05/21/2024 116 (H)  70 - 99 mg/dL Final   Glucose reference range applies only to samples taken after fasting for at least 8 hours.   BUN 05/21/2024 7  6 - 20 mg/dL Final   Creatinine, Ser 05/21/2024 0.91  0.61 - 1.24 mg/dL Final   Calcium  05/21/2024 9.0  8.9 - 10.3 mg/dL Final   GFR, Estimated 05/21/2024 >60  >60 mL/min Final   Comment: (NOTE) Calculated using the CKD-EPI Creatinine Equation (2021)    Anion gap 05/21/2024 8  5 - 15 Final   Performed at [facility] Lab, [address], [place], [place] [address]   Lactic Acid, Venous 05/20/2024 2.4 (HH)  0.5 - 1.9 mmol/L Final   Comment: CRITICAL RESULT CALLED TO, READ BACK BY AND VERIFIED WITH FABIENE BOWLER, RN AT 2128 07.07.25 JLASIGAN Performed at [facility], [address], [place], [place] [address]    WBC 05/21/2024 6.2  4.0 - 10.5 K/uL Final   RBC 05/21/2024 4.54  4.22 - 5.81  MIL/uL Final   Hemoglobin 05/21/2024 12.9 (L)  13.0 - 17.0 g/dL Final   HCT  92/91/7974 37.9 (L)  39.0 - 52.0 % Final   MCV 05/21/2024 83.5  80.0 - 100.0 fL Final   MCH 05/21/2024 28.4  26.0 - 34.0 pg Final   MCHC 05/21/2024 34.0  30.0 - 36.0 g/dL Final   RDW 92/91/7974 14.5  11.5 - 15.5 % Final   Platelets 05/21/2024 270  150 - 400 K/uL Final   nRBC 05/21/2024 0.0  0.0 - 0.2 % Final   Performed at [facility], [address], [place], [place] [address]   Lactic Acid, Venous 05/20/2024 2.4 (HH)  0.5 - 1.9 mmol/L Final   Comment: CRITICAL VALUE NOTED. VALUE IS CONSISTENT WITH PREVIOUSLY REPORTED/CALLED VALUE Performed at [facility], [address], [place], [place] [address]    Lactic Acid, Venous 05/21/2024 1.6  0.5 - 1.9 mmol/L Final   Performed at [facility], [address], [place], [place] [address]   Magnesium  05/21/2024 1.7  1.7 - 2.4 mg/dL Final   Performed at [facility], [address], [place], [place] [address]   WBC 05/22/2024 6.1  4.0 - 10.5 K/uL Final   RBC 05/22/2024 3.83 (L)  4.22 - 5.81 MIL/uL Final   Hemoglobin 05/22/2024 11.0 (L)  13.0 - 17.0 g/dL Final   HCT 92/90/7974 33.0 (L)  39.0 - 52.0 % Final   MCV 05/22/2024 86.2  80.0 - 100.0 fL Final   MCH 05/22/2024 28.7  26.0 - 34.0 pg Final   MCHC 05/22/2024 33.3  30.0 - 36.0 g/dL Final   RDW 92/90/7974 14.4  11.5 - 15.5 % Final   Platelets 05/22/2024 219  150 - 400 K/uL Final   nRBC 05/22/2024 0.0  0.0 - 0.2 % Final   Performed at [facility], [address] [place], [place] [address]   Sodium 05/22/2024 137  135 - 145 mmol/L Final   Potassium 05/22/2024 3.6  3.5 - 5.1 mmol/L Final   Chloride 05/22/2024 107  98 - 111 mmol/L Final   CO2 05/22/2024 24  22 - 32 mmol/L Final   Glucose, Bld 05/22/2024 100 (H)  70 - 99 mg/dL Final   Glucose reference range applies only to samples taken after fasting for at least 8 hours.   BUN 05/22/2024 5 (L)  6 - 20 mg/dL Final   Creatinine, Ser 05/22/2024 0.75  0.61 - 1.24 mg/dL Final   Calcium  05/22/2024 8.1 (L)  8.9 -  10.3 mg/dL Final   GFR, Estimated 05/22/2024 >60  >60 mL/min Final   Comment: (NOTE) Calculated using the CKD-EPI Creatinine Equation (2021)    Anion gap 05/22/2024 6  5 - 15 Final   Performed at [facility] Lab, [address] [place], [place] [address]   WBC 05/23/2024 6.3  4.0 - 10.5 K/uL Final   RBC 05/23/2024 4.58  4.22 - 5.81 MIL/uL Final   Hemoglobin 05/23/2024 12.8 (L)  13.0 - 17.0 g/dL Final   HCT 92/89/7974 39.5  39.0 - 52.0 % Final   MCV 05/23/2024 86.2  80.0 - 100.0 fL Final   MCH 05/23/2024 27.9  26.0 - 34.0 pg Final   MCHC 05/23/2024 32.4  30.0 - 36.0 g/dL Final   RDW 92/89/7974 14.6  11.5 - 15.5 % Final   Platelets 05/23/2024 252  150 - 400 K/uL Final   nRBC 05/23/2024 0.0  0.0 - 0.2 %  Final   Performed at [facility], [address] [place], [place] [address]   Sodium 05/23/2024 139  135 - 145 mmol/L Final   Potassium 05/23/2024 3.4 (L)  3.5 - 5.1 mmol/L Final   Chloride 05/23/2024 102  98 - 111 mmol/L Final   CO2 05/23/2024 22  22 - 32 mmol/L Final   Glucose, Bld 05/23/2024 92  70 - 99 mg/dL Final   Glucose reference range applies only to samples taken after fasting for at least 8 hours.   BUN 05/23/2024 <5 (L)  6 - 20 mg/dL Final   Creatinine, Ser 05/23/2024 0.64  0.61 - 1.24 mg/dL Final   Calcium  05/23/2024 9.7  8.9 - 10.3 mg/dL Final   GFR, Estimated 05/23/2024 >60  >60 mL/min Final   Comment: (NOTE) Calculated using the CKD-EPI Creatinine Equation (2021)    Anion gap 05/23/2024 15  5 - 15 Final   Performed at [facility] Lab, [address], [place], [place] [address]   Magnesium  05/23/2024 1.6 (L)  1.7 - 2.4 mg/dL Final   Performed at [facility], [address], [place], [place] [address]   WBC 05/24/2024 5.1  4.0 - 10.5 K/uL Final   RBC 05/24/2024 4.36  4.22 - 5.81 MIL/uL Final   Hemoglobin 05/24/2024 12.6 (L)  13.0 - 17.0 g/dL Final   HCT 92/88/7974 38.0 (L)  39.0 - 52.0 % Final   MCV 05/24/2024 87.2  80.0 - 100.0 fL Final   MCH  05/24/2024 28.9  26.0 - 34.0 pg Final   MCHC 05/24/2024 33.2  30.0 - 36.0 g/dL Final   RDW 92/88/7974 14.7  11.5 - 15.5 % Final   Platelets 05/24/2024 237  150 - 400 K/uL Final   nRBC 05/24/2024 0.0  0.0 - 0.2 % Final   Performed at [facility] Lab, [address] [place], [place] [address]   Sodium 05/24/2024 136  135 - 145 mmol/L Final   Potassium 05/24/2024 3.6  3.5 - 5.1 mmol/L Final   Chloride 05/24/2024 103  98 - 111 mmol/L Final   CO2 05/24/2024 22  22 - 32 mmol/L Final   Glucose, Bld 05/24/2024 100 (H)  70 - 99 mg/dL Final   Glucose reference range applies only to samples taken after fasting for at least 8 hours.   BUN 05/24/2024 6  6 - 20 mg/dL Final   Creatinine, Ser 05/24/2024 0.61  0.61 - 1.24 mg/dL Final   Calcium  05/24/2024 9.4  8.9 - 10.3 mg/dL Final   Phosphorus 92/88/7974 4.9 (H)  2.5 - 4.6 mg/dL Final   Albumin 92/88/7974 3.3 (L)  3.5 - 5.0 g/dL Final   GFR, Estimated 05/24/2024 >60  >60 mL/min Final   Comment: (NOTE) Calculated using the CKD-EPI Creatinine Equation (2021)    Anion gap 05/24/2024 11  5 - 15 Final   Performed at [facility], [address] [place], [place] [address]  Admission on 05/08/2024, Discharged on 05/10/2024  Component Date Value Ref Range Status   Glucose-Capillary 05/08/2024 105 (H)  70 - 99 mg/dL Final   Glucose reference range applies only to samples taken after fasting for at least 8 hours.   Troponin I (High Sensitivity) 05/08/2024 6  <18 ng/L Final   Comment: (NOTE) Elevated high sensitivity troponin I (hsTnI) values and significant  changes across serial measurements may suggest ACS but many other  chronic and acute conditions are known to elevate hsTnI results.  Refer  to the Links section for chest pain algorithms and additional  guidance. Performed at [facility], [address], [place], [place] [address]    WBC 05/08/2024 13.3 (H)  4.0 - 10.5 K/uL Final   RBC 05/08/2024 5.23  4.22 - 5.81 MIL/uL Final    Hemoglobin 05/08/2024 14.8  13.0 - 17.0 g/dL Final   HCT 93/74/7974 46.3  39.0 - 52.0 % Final   MCV 05/08/2024 88.5  80.0 - 100.0 fL Final   MCH 05/08/2024 28.3  26.0 - 34.0 pg Final   MCHC 05/08/2024 32.0  30.0 - 36.0 g/dL Final   RDW 93/74/7974 14.4  11.5 - 15.5 % Final   Platelets 05/08/2024 335  150 - 400 K/uL Final   nRBC 05/08/2024 0.0  0.0 - 0.2 % Final   Performed at [facility], [address], [place], [place] [address]   Sodium 05/08/2024 142  135 - 145 mmol/L Final   Potassium 05/08/2024 4.2  3.5 - 5.1 mmol/L Final   Chloride 05/08/2024 103  98 - 111 mmol/L Final   CO2 05/08/2024 10 (L)  22 - 32 mmol/L Final   Glucose, Bld 05/08/2024 99  70 - 99 mg/dL Final   Glucose reference range applies only to samples taken after fasting for at least 8 hours.   BUN 05/08/2024 8  6 - 20 mg/dL Final   Creatinine, Ser 05/08/2024 1.31 (H)  0.61 - 1.24 mg/dL Final   Calcium  05/08/2024 10.3  8.9 - 10.3 mg/dL Final   Total Protein 93/74/7974 9.4 (H)  6.5 - 8.1 g/dL Final   Albumin 93/74/7974 4.8  3.5 - 5.0 g/dL Final   AST 93/74/7974 35  15 - 41 U/L Final   ALT 05/08/2024 15  0 - 44 U/L Final   Alkaline Phosphatase 05/08/2024 109  38 - 126 U/L Final   Total Bilirubin 05/08/2024 2.4 (H)  0.0 - 1.2 mg/dL Final   GFR, Estimated 05/08/2024 >60  >60 mL/min Final   Comment: (NOTE) Calculated using the CKD-EPI Creatinine Equation (2021)    Anion gap 05/08/2024 29 (H)  5 - 15 Final   Comment: ELECTROLYTES REPEATED TO VERIFY Performed at [facility], [address] [place], [place] [address]    Troponin I (High Sensitivity) 05/08/2024 7  <18 ng/L Final   Comment: (NOTE) Elevated high sensitivity troponin I (hsTnI) values and significant  changes across serial measurements may suggest ACS but many other  chronic and acute conditions are known to elevate hsTnI results.  Refer to the Links section for chest pain algorithms and additional  guidance. Performed at [facility], [address], [place], [place] [address]    Lipase 05/08/2024 34  11 - 51 U/L Final   Performed at [facility], [address], [place], [place] [address]   Alcohol, Ethyl (B) 05/09/2024 <15  <15 mg/dL Final   Comment: (NOTE) For medical purposes only. Performed at [facility], [address] [place], [place] [address]    Lactic Acid, Venous 05/09/2024 1.2  0.5 - 1.9 mmol/L Final   Performed at [facility], [address] [place] [address] [place], [place] [address]   Color, Urine 05/09/2024 YELLOW  YELLOW Final   APPearance 05/09/2024 CLEAR  CLEAR Final   Specific Gravity, Urine 05/09/2024 1.039 (H)  1.005 - 1.030 Final   pH 05/09/2024 5.0  5.0 - 8.0 Final   Glucose, UA 05/09/2024 NEGATIVE  NEGATIVE mg/dL  Final   Hgb urine dipstick 05/09/2024 MODERATE (A)  NEGATIVE Final   Bilirubin Urine 05/09/2024 NEGATIVE  NEGATIVE Final   Ketones, ur 05/09/2024 80 (A)  NEGATIVE mg/dL Final   Protein, ur 93/73/7974 >=300 (A)  NEGATIVE mg/dL Final   Nitrite 93/73/7974 NEGATIVE  NEGATIVE Final   Leukocytes,Ua 05/09/2024 NEGATIVE  NEGATIVE Final   RBC / HPF 05/09/2024 6-10  0 - 5 RBC/hpf Final   WBC, UA 05/09/2024 0-5  0 - 5 WBC/hpf Final   Bacteria, UA 05/09/2024 NONE SEEN  NONE SEEN Final   Squamous Epithelial / HPF 05/09/2024 6-10  0 - 5 /HPF Final   Mucus 05/09/2024 PRESENT   Final   Performed at [facility], [address] [place], [place] [address]   Sodium 05/09/2024 132 (L)  135 - 145 mmol/L Final   DELTA CHECK NOTED   Potassium 05/09/2024 3.6  3.5 - 5.1 mmol/L Final   Chloride 05/09/2024 102  98 - 111 mmol/L Final   CO2 05/09/2024 21 (L)  22 - 32 mmol/L Final   Glucose, Bld 05/09/2024 88  70 - 99 mg/dL Final   Glucose reference range applies only to samples taken after fasting for at least 8 hours.   BUN 05/09/2024 6  6 - 20 mg/dL Final   Creatinine, Ser 05/09/2024 0.74  0.61 - 1.24 mg/dL Final   Calcium  05/09/2024 9.5  8.9 - 10.3 mg/dL Final   GFR, Estimated  05/09/2024 >60  >60 mL/min Final   Comment: (NOTE) Calculated using the CKD-EPI Creatinine Equation (2021)    Anion gap 05/09/2024 9  5 - 15 Final   Performed at [facility], [address], [place], [place] [address]   Total Protein 05/09/2024 7.2  6.5 - 8.1 g/dL Final   Albumin 93/73/7974 3.8  3.5 - 5.0 g/dL Final   AST 93/73/7974 32  15 - 41 U/L Final   ALT 05/09/2024 15  0 - 44 U/L Final   Alkaline Phosphatase 05/09/2024 92  38 - 126 U/L Final   Total Bilirubin 05/09/2024 1.8 (H)  0.0 - 1.2 mg/dL Final   Bilirubin, Direct 05/09/2024 0.4 (H)  0.0 - 0.2 mg/dL Final   Indirect Bilirubin 05/09/2024 1.4 (H)  0.3 - 0.9 mg/dL Final   Performed at [facility], [address], [place], [place] [address]   pH, Ven 05/09/2024 7.43  7.25 - 7.43 Final   pCO2, Ven 05/09/2024 32 (L)  44 - 60 mmHg Final   pO2, Ven 05/09/2024 50 (H)  32 - 45 mmHg Final   Bicarbonate 05/09/2024 21.2  20.0 - 28.0 mmol/L Final   Acid-base deficit 05/09/2024 2.3 (H)  0.0 - 2.0 mmol/L Final   O2 Saturation 05/09/2024 80.6  % Final   Patient temperature 05/09/2024 37.0   Final   Performed at [facility], [address] [place], [place] [address]   WBC 05/09/2024 7.9  4.0 - 10.5 K/uL Final   RBC 05/09/2024 4.75  4.22 - 5.81 MIL/uL Final   Hemoglobin 05/09/2024 13.5  13.0 - 17.0 g/dL Final   HCT 93/73/7974 40.5  39.0 - 52.0 % Final   MCV 05/09/2024 85.3  80.0 - 100.0 fL Final   MCH 05/09/2024 28.4  26.0 - 34.0 pg Final   MCHC 05/09/2024 33.3  30.0 - 36.0 g/dL Final   RDW 93/73/7974 14.2  11.5 - 15.5 % Final   Platelets 05/09/2024 260  150 - 400 K/uL Final  nRBC 05/09/2024 0.0  0.0 - 0.2 % Final   Performed at [facility], [address], [place], [place] [address]  Admission on 04/03/2024, Discharged on 04/12/2024  Component Date Value Ref Range Status   Sodium 04/04/2024 135  135 - 145 mmol/L Final   Potassium 04/04/2024 3.8  3.5 - 5.1 mmol/L Final   Chloride 04/04/2024 102  98 - 111  mmol/L Final   CO2 04/04/2024 22  22 - 32 mmol/L Final   Glucose, Bld 04/04/2024 109 (H)  70 - 99 mg/dL Final   Glucose reference range applies only to samples taken after fasting for at least 8 hours.   BUN 04/04/2024 6  6 - 20 mg/dL Final   Creatinine, Ser 04/04/2024 0.74  0.61 - 1.24 mg/dL Final   Calcium  04/04/2024 9.1  8.9 - 10.3 mg/dL Final   GFR, Estimated 04/04/2024 >60  >60 mL/min Final   Comment: (NOTE) Calculated using the CKD-EPI Creatinine Equation (2021)    Anion gap 04/04/2024 11  5 - 15 Final   Performed at [facility], [address], [place], [place] [address]   Cholesterol 04/04/2024 169  0 - 200 mg/dL Final   Triglycerides 94/77/7974 195 (H)  <150 mg/dL Final   HDL 94/77/7974 56  >40 mg/dL Final   Total CHOL/HDL Ratio 04/04/2024 3.0  RATIO Final   VLDL 04/04/2024 39  0 - 40 mg/dL Final   LDL Cholesterol 04/04/2024 74  0 - 99 mg/dL Final   Comment:        Total Cholesterol/HDL:CHD Risk Coronary Heart Disease Risk Table                     Men   Women  1/2 Average Risk   3.4   3.3  Average Risk       5.0   4.4  2 X Average Risk   9.6   7.1  3 X Average Risk  23.4   11.0        Use the calculated Patient Ratio above and the CHD Risk Table to determine the patient's CHD Risk.        ATP III CLASSIFICATION (LDL):  <100     mg/dL   Optimal  899-870  mg/dL   Near or Above                    Optimal  130-159  mg/dL   Borderline  839-810  mg/dL   High  >809     mg/dL   Very High Performed at [facility], [address] [place], [place] [address]   Admission on 04/02/2024, Discharged on 04/03/2024  Component Date Value Ref Range Status   Sodium 04/02/2024 137  135 - 145 mmol/L Final   Potassium 04/02/2024 3.3 (L)  3.5 - 5.1 mmol/L Final   Chloride 04/02/2024 102  98 - 111 mmol/L Final   CO2 04/02/2024 23  22 - 32 mmol/L Final   Glucose, Bld 04/02/2024 90  70 - 99 mg/dL Final   Glucose reference range applies only to  samples taken after fasting for at least 8 hours.   BUN 04/02/2024 <5 (L)  6 - 20 mg/dL Final   Creatinine, Ser 04/02/2024 0.77  0.61 - 1.24 mg/dL Final   Calcium  04/02/2024 9.6  8.9 - 10.3 mg/dL Final   Total Protein 94/79/7974 8.1  6.5 - 8.1 g/dL Final   Albumin 94/79/7974 3.8  3.5 -  5.0 g/dL Final   AST 94/79/7974 38  15 - 41 U/L Final   ALT 04/02/2024 23  0 - 44 U/L Final   Alkaline Phosphatase 04/02/2024 116  38 - 126 U/L Final   Total Bilirubin 04/02/2024 0.8  0.0 - 1.2 mg/dL Final   GFR, Estimated 04/02/2024 >60  >60 mL/min Final   Comment: (NOTE) Calculated using the CKD-EPI Creatinine Equation (2021)    Anion gap 04/02/2024 12  5 - 15 Final   Performed at [facility], [address], [place], [place] [address]   Alcohol, Ethyl (B) 04/02/2024 257 (H)  <15 mg/dL Final   Comment: Please note change in reference range. (NOTE) For medical purposes only. Performed at [facility], [address], [place], [place] [address]    Opiates 04/02/2024 NONE DETECTED  NONE DETECTED Final   Cocaine 04/02/2024 NONE DETECTED  NONE DETECTED Final   Benzodiazepines 04/02/2024 POSITIVE (A)  NONE DETECTED Final   Amphetamines 04/02/2024 NONE DETECTED  NONE DETECTED Final   Tetrahydrocannabinol 04/02/2024 NONE DETECTED  NONE DETECTED Final   Barbiturates 04/02/2024 NONE DETECTED  NONE DETECTED Final   Comment: (NOTE) DRUG SCREEN FOR MEDICAL PURPOSES ONLY.  IF CONFIRMATION IS NEEDED FOR ANY PURPOSE, NOTIFY LAB WITHIN 5 DAYS.  LOWEST DETECTABLE LIMITS FOR URINE DRUG SCREEN Drug Class                     Cutoff (ng/mL) Amphetamine and metabolites    1000 Barbiturate and metabolites    200 Benzodiazepine                 200 Opiates and metabolites        300 Cocaine and metabolites        300 THC                            50 Performed at [facility], [address] [place], [place] [address]    WBC 04/02/2024 6.3  4.0 - 10.5  K/uL Final   RBC 04/02/2024 4.44  4.22 - 5.81 MIL/uL Final   Hemoglobin 04/02/2024 12.7 (L)  13.0 - 17.0 g/dL Final   HCT 94/79/7974 39.1  39.0 - 52.0 % Final   MCV 04/02/2024 88.1  80.0 - 100.0 fL Final   MCH 04/02/2024 28.6  26.0 - 34.0 pg Final   MCHC 04/02/2024 32.5  30.0 - 36.0 g/dL Final   RDW 94/79/7974 14.5  11.5 - 15.5 % Final   Platelets 04/02/2024 448 (H)  150 - 400 K/uL Final   nRBC 04/02/2024 0.0  0.0 - 0.2 % Final   Neutrophils Relative % 04/02/2024 63  % Final   Neutro Abs 04/02/2024 3.9  1.7 - 7.7 K/uL Final   Lymphocytes Relative 04/02/2024 25  % Final   Lymphs Abs 04/02/2024 1.6  0.7 - 4.0 K/uL Final   Monocytes Relative 04/02/2024 10  % Final   Monocytes Absolute 04/02/2024 0.7  0.1 - 1.0 K/uL Final   Eosinophils Relative 04/02/2024 1  % Final   Eosinophils Absolute 04/02/2024 0.1  0.0 - 0.5 K/uL Final   Basophils Relative 04/02/2024 1  % Final   Basophils Absolute 04/02/2024 0.1  0.0 - 0.1 K/uL Final   Immature Granulocytes 04/02/2024 0  % Final   Abs Immature Granulocytes 04/02/2024 0.02  0.00 - 0.07 K/uL Final   Performed at [facility], [address], [place], [place] [address]  No results displayed because visit has over 200 results.    Admission on 03/15/2024, Discharged on 03/16/2024  Component Date Value Ref Range Status   Glucose-Capillary 03/15/2024 97  70 - 99 mg/dL Final   Glucose reference range applies only to samples taken after fasting for at least 8 hours.   Sodium 03/15/2024 142  135 - 145 mmol/L Final   Potassium 03/15/2024 3.3 (L)  3.5 - 5.1 mmol/L Final   Chloride 03/15/2024 101  98 - 111 mmol/L Final   CO2 03/15/2024 19 (L)  22 - 32 mmol/L Final   Glucose, Bld 03/15/2024 90  70 - 99 mg/dL Final   Glucose reference range applies only to samples taken after fasting for at least 8 hours.   BUN 03/15/2024 <5 (L)  6 - 20 mg/dL Final   QA FLAGS AND/OR RANGES MODIFIED BY DEMOGRAPHIC UPDATE ON 05/02 AT 1931   Creatinine, Ser  03/15/2024 0.79  0.61 - 1.24 mg/dL Final   Calcium  03/15/2024 8.6 (L)  8.9 - 10.3 mg/dL Final   Total Protein 94/97/7974 8.0  6.5 - 8.1 g/dL Final   Albumin 94/97/7974 4.0  3.5 - 5.0 g/dL Final   AST 94/97/7974 55 (H)  15 - 41 U/L Final   ALT 03/15/2024 21  0 - 44 U/L Final   Alkaline Phosphatase 03/15/2024 92  38 - 126 U/L Final   Total Bilirubin 03/15/2024 0.7  0.0 - 1.2 mg/dL Final   GFR, Estimated 03/15/2024 59 (L)  >60 mL/min Final   Comment: (NOTE) Calculated using the CKD-EPI Creatinine Equation (2021)    Anion gap 03/15/2024 22 (H)  5 - 15 Final   Comment: ELECTROLYTES REPEATED TO VERIFY Performed at [facility], [address] [place], [place] [address]    WBC 03/15/2024 9.4  4.0 - 10.5 K/uL Final   RBC 03/15/2024 4.64  4.22 - 5.81 MIL/uL Final   Hemoglobin 03/15/2024 13.3  13.0 - 17.0 g/dL Final   HCT 94/97/7974 41.4  39.0 - 52.0 % Final   MCV 03/15/2024 89.2  80.0 - 100.0 fL Final   MCH 03/15/2024 28.7  26.0 - 34.0 pg Final   MCHC 03/15/2024 32.1  30.0 - 36.0 g/dL Final   RDW 94/97/7974 14.0  11.5 - 15.5 % Final   Platelets 03/15/2024 302  150 - 400 K/uL Final   nRBC 03/15/2024 0.0  0.0 - 0.2 % Final   Neutrophils Relative % 03/15/2024 77  % Final   Neutro Abs 03/15/2024 7.3  1.7 - 7.7 K/uL Final   Lymphocytes Relative 03/15/2024 14  % Final   Lymphs Abs 03/15/2024 1.3  0.7 - 4.0 K/uL Final   Monocytes Relative 03/15/2024 7  % Final   Monocytes Absolute 03/15/2024 0.7  0.1 - 1.0 K/uL Final   Eosinophils Relative 03/15/2024 0  % Final   Eosinophils Absolute 03/15/2024 0.0  0.0 - 0.5 K/uL Final   Basophils Relative 03/15/2024 1  % Final   Basophils Absolute 03/15/2024 0.1  0.0 - 0.1 K/uL Final   Immature Granulocytes 03/15/2024 1  % Final   Abs Immature Granulocytes 03/15/2024 0.05  0.00 - 0.07 K/uL Final   Performed at [facility], [address] [place], [place] [address]   Ammonia 03/15/2024 36 (H)  9 - 35 umol/L Final   Performed at [facility], [address], [place], [place] [address]   Sodium 03/15/2024 142  135 -  145 mmol/L Final   Potassium 03/15/2024 3.1 (L)  3.5 - 5.1 mmol/L Final   Chloride 03/15/2024 102  98 - 111 mmol/L Final   BUN 03/15/2024 <3 (L)  6 - 20 mg/dL Final   QA FLAGS AND/OR RANGES MODIFIED BY DEMOGRAPHIC UPDATE ON 05/02 AT 1931   Creatinine, Ser 03/15/2024 1.30 (H)  0.61 - 1.24 mg/dL Final   Glucose, Bld 94/97/7974 93  70 - 99 mg/dL Final   Glucose reference range applies only to samples taken after fasting for at least 8 hours.   Calcium , Ion 03/15/2024 0.89 (LL)  1.15 - 1.40 mmol/L Final   TCO2 03/15/2024 20 (L)  22 - 32 mmol/L Final   Hemoglobin 03/15/2024 15.0  13.0 - 17.0 g/dL Final   HCT 94/97/7974 44.0  39.0 - 52.0 % Final   Comment 03/15/2024 NOTIFIED PHYSICIAN   Final   Opiates 03/15/2024 NONE DETECTED  NONE DETECTED Final   Cocaine 03/15/2024 NONE DETECTED  NONE DETECTED Final   Benzodiazepines 03/15/2024 POSITIVE (A)  NONE DETECTED Final   Amphetamines 03/15/2024 NONE DETECTED  NONE DETECTED Final   Tetrahydrocannabinol 03/15/2024 NONE DETECTED  NONE DETECTED Final   Barbiturates 03/15/2024 NONE DETECTED  NONE DETECTED Final   Comment: (NOTE) DRUG SCREEN FOR MEDICAL PURPOSES ONLY.  IF CONFIRMATION IS NEEDED FOR ANY PURPOSE, NOTIFY LAB WITHIN 5 DAYS.  LOWEST DETECTABLE LIMITS FOR URINE DRUG SCREEN Drug Class                     Cutoff (ng/mL) Amphetamine and metabolites    1000 Barbiturate and metabolites    200 Benzodiazepine                 200 Opiates and metabolites        300 Cocaine and metabolites        300 THC                            50 Performed at [facility], [address] [place], [place] [address]    Alcohol, Ethyl (B) 03/15/2024 495 (HH)  <15 mg/dL Final   Comment: CRITICAL RESULT CALLED TO, READ BACK BY AND VERIFIED WITH C WELLS RN 03/15/2024 1823 BNUNNERY Please note change in reference range. (NOTE) For medical purposes only. Performed at [facility], [address], [place], [place] [address]   Admission on 01/25/2024, Discharged on 02/03/2024  Component Date Value Ref Range Status   Folate 01/29/2024 22.8  >5.9 ng/mL Final   Performed at [facility], [address] [place], [place] [address]   Hgb A1c MFr Bld 01/29/2024 4.5 (L)  4.8 - 5.6 % Final   Comment: (NOTE) Pre diabetes:          5.7%-6.4%  Diabetes:              >6.4%  Glycemic control for   <7.0% adults with diabetes    Mean Plasma Glucose 01/29/2024 82.45  mg/dL Final   Performed at [facility], [address], [place], [place] [address]   Cholesterol 01/29/2024 174  0 - 200 mg/dL Final   Triglycerides 96/82/7974 165 (H)  <150 mg/dL Final   HDL 96/82/7974 50  >40 mg/dL Final   Total CHOL/HDL Ratio 01/29/2024 3.5  RATIO Final   VLDL 01/29/2024 33  0 - 40 mg/dL Final   LDL Cholesterol 01/29/2024 91  0 -  99 mg/dL Final   Comment:        Total Cholesterol/HDL:CHD Risk Coronary Heart Disease Risk Table                     Men   Women  1/2 Average Risk   3.4   3.3  Average Risk       5.0   4.4  2 X Average Risk   9.6   7.1  3 X Average Risk  23.4   11.0        Use the calculated Patient Ratio above and the CHD Risk Table to determine the patient's CHD Risk.        ATP III CLASSIFICATION (LDL):  <100     mg/dL   Optimal  899-870  mg/dL   Near or Above                    Optimal  130-159  mg/dL   Borderline  839-810  mg/dL   High  >809     mg/dL   Very High Performed at [facility], [address] [place], [place] [address]    RPR Ser Ql 01/29/2024 NON REACTIVE  NON REACTIVE Final   Performed at [facility], [address], [place], [place] [address]   TSH 01/29/2024 4.117  0.350 - 4.500 uIU/mL Final   Comment: Performed by a 3rd Generation assay with a functional sensitivity of <=0.01 uIU/mL. Performed at [facility], [address], [place], [place] [address]     Vitamin B-12 01/29/2024 585  180 - 914 pg/mL Final   Comment: (NOTE) This assay is not validated for testing neonatal or myeloproliferative syndrome specimens for Vitamin B12 levels. Performed at [facility], [address] [place], [place] [address]    Vit D, 25-Hydroxy 01/29/2024 29.42 (L)  30 - 100 ng/mL Final   Comment: (NOTE) Vitamin D  deficiency has been defined by the Institute of Medicine  and an Endocrine Society practice guideline as a level of serum 25-OH  vitamin D  less than 20 ng/mL (1,2). The Endocrine Society went on to  further define vitamin D  insufficiency as a level between 21 and 29  ng/mL (2).  1. IOM (Institute of Medicine). 2010. Dietary reference intakes for  calcium  and D. [place]: [employer]. 2. Holick MF, Binkley [place], Bischoff-Ferrari HA, et al. Evaluation,  treatment, and prevention of vitamin D  deficiency: an Endocrine  Society clinical practice guideline, JCEM. 2011 Jul; 96(7): 1911-30.  Performed at [facility], [address] [place], [place] [address]    Free T4 02/01/2024 0.74  0.61 - 1.12 ng/dL Final   Comment: (NOTE) Biotin ingestion may interfere with free T4 tests. If the results are inconsistent with the TSH level, previous test results, or the clinical presentation, then consider biotin interference. If needed, order repeat testing after stopping biotin. Performed at [facility], [address], [place], [place] [address]   Admission on 01/23/2024, Discharged on 01/25/2024  Component Date Value Ref Range Status   Glucose-Capillary 01/23/2024 111 (H)  70 - 99 mg/dL Final   Glucose reference range applies only to samples taken after fasting for at least 8 hours.   Sodium 01/23/2024 133 (L)  135 - 145 mmol/L Final   Potassium 01/23/2024 4.3  3.5 - 5.1 mmol/L Final   Chloride 01/23/2024 102  98 - 111 mmol/L Final  CO2 01/23/2024 23  22 - 32 mmol/L Final   Glucose, Bld 01/23/2024  100 (H)  70 - 99 mg/dL Final   Glucose reference range applies only to samples taken after fasting for at least 8 hours.   BUN 01/23/2024 8  6 - 20 mg/dL Final   Creatinine, Ser 01/23/2024 0.92  0.61 - 1.24 mg/dL Final   Calcium  01/23/2024 9.4  8.9 - 10.3 mg/dL Final   Total Protein 96/88/7974 7.4  6.5 - 8.1 g/dL Final   Albumin 96/88/7974 3.6  3.5 - 5.0 g/dL Final   AST 96/88/7974 125 (H)  15 - 41 U/L Final   ALT 01/23/2024 57 (H)  0 - 44 U/L Final   Alkaline Phosphatase 01/23/2024 85  38 - 126 U/L Final   Total Bilirubin 01/23/2024 0.7  0.0 - 1.2 mg/dL Final   GFR, Estimated 01/23/2024 >60  >60 mL/min Final   Comment: (NOTE) Calculated using the CKD-EPI Creatinine Equation (2021)    Anion gap 01/23/2024 8  5 - 15 Final   Performed at [facility], [address], [place], [place] [address]   WBC 01/23/2024 5.2  4.0 - 10.5 K/uL Final   RBC 01/23/2024 3.69 (L)  4.22 - 5.81 MIL/uL Final   Hemoglobin 01/23/2024 11.7 (L)  13.0 - 17.0 g/dL Final   HCT 96/88/7974 35.7 (L)  39.0 - 52.0 % Final   MCV 01/23/2024 96.7  80.0 - 100.0 fL Final   MCH 01/23/2024 31.7  26.0 - 34.0 pg Final   MCHC 01/23/2024 32.8  30.0 - 36.0 g/dL Final   RDW 96/88/7974 13.8  11.5 - 15.5 % Final   Platelets 01/23/2024 291  150 - 400 K/uL Final   nRBC 01/23/2024 0.0  0.0 - 0.2 % Final   Neutrophils Relative % 01/23/2024 65  % Final   Neutro Abs 01/23/2024 3.3  1.7 - 7.7 K/uL Final   Lymphocytes Relative 01/23/2024 16  % Final   Lymphs Abs 01/23/2024 0.8  0.7 - 4.0 K/uL Final   Monocytes Relative 01/23/2024 15  % Final   Monocytes Absolute 01/23/2024 0.8  0.1 - 1.0 K/uL Final   Eosinophils Relative 01/23/2024 3  % Final   Eosinophils Absolute 01/23/2024 0.2  0.0 - 0.5 K/uL Final   Basophils Relative 01/23/2024 1  % Final   Basophils Absolute 01/23/2024 0.1  0.0 - 0.1 K/uL Final   Immature Granulocytes 01/23/2024 0  % Final   Abs Immature Granulocytes 01/23/2024 0.02  0.00 - 0.07 K/uL Final   Performed at  [facility], [address], [place], [place] [address]   Magnesium  01/23/2024 2.1  1.7 - 2.4 mg/dL Final   Performed at [facility], [address], [place], [place] [address]   Alcohol, Ethyl (B) 01/23/2024 <10  <10 mg/dL Final   Comment: (NOTE) Lowest detectable limit for serum alcohol is 10 mg/dL.  For medical purposes only. Performed at [facility], [address] [place], [place] [address]    Opiates 01/23/2024 NONE DETECTED  NONE DETECTED Final   Cocaine 01/23/2024 NONE DETECTED  NONE DETECTED Final   Benzodiazepines 01/23/2024 POSITIVE (A)  NONE DETECTED Final   Amphetamines 01/23/2024 NONE DETECTED  NONE DETECTED Final   Tetrahydrocannabinol 01/23/2024 NONE DETECTED  NONE DETECTED Final   Barbiturates 01/23/2024 NONE DETECTED  NONE DETECTED Final   Comment: (NOTE) DRUG SCREEN FOR MEDICAL PURPOSES ONLY.  IF CONFIRMATION IS NEEDED FOR ANY PURPOSE, NOTIFY LAB WITHIN  5 DAYS.  LOWEST DETECTABLE LIMITS FOR URINE DRUG SCREEN Drug Class                     Cutoff (ng/mL) Amphetamine and metabolites    1000 Barbiturate and metabolites    200 Benzodiazepine                 200 Opiates and metabolites        300 Cocaine and metabolites        300 THC                            50 Performed at [facility], [address], [place], [place] [address]    Color, Urine 01/23/2024 YELLOW  YELLOW Final   APPearance 01/23/2024 CLEAR  CLEAR Final   Specific Gravity, Urine 01/23/2024 1.011  1.005 - 1.030 Final   pH 01/23/2024 6.0  5.0 - 8.0 Final   Glucose, UA 01/23/2024 NEGATIVE  NEGATIVE mg/dL Final   Hgb urine dipstick 01/23/2024 NEGATIVE  NEGATIVE Final   Bilirubin Urine 01/23/2024 NEGATIVE  NEGATIVE Final   Ketones, ur 01/23/2024 NEGATIVE  NEGATIVE mg/dL Final   Protein, ur 96/88/7974 NEGATIVE  NEGATIVE mg/dL Final   Nitrite 96/88/7974 NEGATIVE  NEGATIVE Final   Leukocytes,Ua 01/23/2024 NEGATIVE  NEGATIVE Final   Performed at [facility],  [address] [place], [place] [address]   Glucose-Capillary 01/23/2024 87  70 - 99 mg/dL Final   Glucose reference range applies only to samples taken after fasting for at least 8 hours.   Sodium 01/23/2024 133 (L)  135 - 145 mmol/L Final   Potassium 01/23/2024 4.3  3.5 - 5.1 mmol/L Final   Chloride 01/23/2024 101  98 - 111 mmol/L Final   BUN 01/23/2024 9  6 - 20 mg/dL Final   Creatinine, Ser 01/23/2024 0.90  0.61 - 1.24 mg/dL Final   Glucose, Bld 96/88/7974 93  70 - 99 mg/dL Final   Glucose reference range applies only to samples taken after fasting for at least 8 hours.   Calcium , Ion 01/23/2024 1.05 (L)  1.15 - 1.40 mmol/L Final   TCO2 01/23/2024 25  22 - 32 mmol/L Final   Hemoglobin 01/23/2024 12.2 (L)  13.0 - 17.0 g/dL Final   HCT 96/88/7974 36.0 (L)  39.0 - 52.0 % Final   HIV Screen 4th Generation wRfx 01/24/2024 Non Reactive  Non Reactive Final   Performed at [facility], [address], [place], [place] [address]   WBC 01/24/2024 4.5  4.0 - 10.5 K/uL Final   RBC 01/24/2024 3.75 (L)  4.22 - 5.81 MIL/uL Final   Hemoglobin 01/24/2024 11.8 (L)  13.0 - 17.0 g/dL Final   HCT 96/87/7974 35.2 (L)  39.0 - 52.0 % Final   MCV 01/24/2024 93.9  80.0 - 100.0 fL Final   MCH 01/24/2024 31.5  26.0 - 34.0 pg Final   MCHC 01/24/2024 33.5  30.0 - 36.0 g/dL Final   RDW 96/87/7974 13.8  11.5 - 15.5 % Final   Platelets 01/24/2024 301  150 - 400 K/uL Final   nRBC 01/24/2024 0.0  0.0 - 0.2 % Final   Performed at [facility], [address] [place], [place] [address]   Sodium 01/24/2024 135  135 - 145 mmol/L Final   Potassium 01/24/2024 3.9  3.5 - 5.1 mmol/L Final   Chloride 01/24/2024 104  98 -  111 mmol/L Final   CO2 01/24/2024 22  22 - 32 mmol/L Final   Glucose, Bld 01/24/2024 96  70 - 99 mg/dL Final   Glucose reference range applies only to samples taken after fasting for at least 8 hours.   BUN 01/24/2024 8  6 - 20 mg/dL Final   Creatinine, Ser 01/24/2024 0.96  0.61 - 1.24 mg/dL  Final   Calcium  01/24/2024 9.5  8.9 - 10.3 mg/dL Final   Total Protein 96/87/7974 7.5  6.5 - 8.1 g/dL Final   Albumin 96/87/7974 3.6  3.5 - 5.0 g/dL Final   AST 96/87/7974 86 (H)  15 - 41 U/L Final   ALT 01/24/2024 51 (H)  0 - 44 U/L Final   Alkaline Phosphatase 01/24/2024 81  38 - 126 U/L Final   Total Bilirubin 01/24/2024 0.7  0.0 - 1.2 mg/dL Final   GFR, Estimated 01/24/2024 >60  >60 mL/min Final   Comment: (NOTE) Calculated using the CKD-EPI Creatinine Equation (2021)    Anion gap 01/24/2024 9  5 - 15 Final   Performed at [facility], [address] [place], [place] [address]   WBC 01/25/2024 4.6  4.0 - 10.5 K/uL Final   RBC 01/25/2024 3.72 (L)  4.22 - 5.81 MIL/uL Final   Hemoglobin 01/25/2024 11.7 (L)  13.0 - 17.0 g/dL Final   HCT 96/86/7974 35.1 (L)  39.0 - 52.0 % Final   MCV 01/25/2024 94.4  80.0 - 100.0 fL Final   MCH 01/25/2024 31.5  26.0 - 34.0 pg Final   MCHC 01/25/2024 33.3  30.0 - 36.0 g/dL Final   RDW 96/86/7974 13.8  11.5 - 15.5 % Final   Platelets 01/25/2024 292  150 - 400 K/uL Final   nRBC 01/25/2024 0.0  0.0 - 0.2 % Final   Performed at [facility], [address] [place], [place] [address]   Sodium 01/25/2024 138  135 - 145 mmol/L Final   Potassium 01/25/2024 4.3  3.5 - 5.1 mmol/L Final   Chloride 01/25/2024 104  98 - 111 mmol/L Final   CO2 01/25/2024 24  22 - 32 mmol/L Final   Glucose, Bld 01/25/2024 93  70 - 99 mg/dL Final   Glucose reference range applies only to samples taken after fasting for at least 8 hours.   BUN 01/25/2024 8  6 - 20 mg/dL Final   Creatinine, Ser 01/25/2024 0.79  0.61 - 1.24 mg/dL Final   Calcium  01/25/2024 9.2  8.9 - 10.3 mg/dL Final   GFR, Estimated 01/25/2024 >60  >60 mL/min Final   Comment: (NOTE) Calculated using the CKD-EPI Creatinine Equation (2021)    Anion gap 01/25/2024 10  5 - 15 Final   Performed at [facility], [address], [place], [place] [address]  Admission on 01/18/2024, Discharged on  01/23/2024  Component Date Value Ref Range Status   POC Amphetamine UR 01/18/2024 None Detected  NONE DETECTED (Cut Off Level 1000 ng/mL) Final   POC Secobarbital (BAR) 01/18/2024 None Detected  NONE DETECTED (Cut Off Level 300 ng/mL) Final   POC Buprenorphine (BUP) 01/18/2024 None Detected  NONE DETECTED (Cut Off Level 10 ng/mL) Final   POC Oxazepam (BZO) 01/18/2024 Positive (A)  NONE DETECTED (Cut Off Level 300 ng/mL) Final   POC Cocaine UR 01/18/2024 None Detected  NONE DETECTED (Cut Off Level 300 ng/mL) Final   POC Methamphetamine UR 01/18/2024 None Detected  NONE DETECTED (Cut Off Level 1000 ng/mL) Final   POC  Morphine  01/18/2024 None Detected  NONE DETECTED (Cut Off Level 300 ng/mL) Final   POC Methadone UR 01/18/2024 None Detected  NONE DETECTED (Cut Off Level 300 ng/mL) Final   POC Oxycodone  UR 01/18/2024 None Detected  NONE DETECTED (Cut Off Level 100 ng/mL) Final   POC Marijuana UR 01/18/2024 Positive (A)  NONE DETECTED (Cut Off Level 50 ng/mL) Final   Alcohol, Ethyl (B) 01/18/2024 <10  <10 mg/dL Final   Comment: (NOTE) Lowest detectable limit for serum alcohol is 10 mg/dL.  For medical purposes only. Performed at [facility], [address] [place], [place] [address]    Sodium 01/18/2024 138  135 - 145 mmol/L Final   Potassium 01/18/2024 3.7  3.5 - 5.1 mmol/L Final   Chloride 01/18/2024 95 (L)  98 - 111 mmol/L Final   CO2 01/18/2024 26  22 - 32 mmol/L Final   Glucose, Bld 01/18/2024 94  70 - 99 mg/dL Final   Glucose reference range applies only to samples taken after fasting for at least 8 hours.   BUN 01/18/2024 <5 (L)  6 - 20 mg/dL Final   Creatinine, Ser 01/18/2024 0.76  0.61 - 1.24 mg/dL Final   Calcium  01/18/2024 9.2  8.9 - 10.3 mg/dL Final   Total Protein 96/93/7974 7.9  6.5 - 8.1 g/dL Final   Albumin 96/93/7974 3.7  3.5 - 5.0 g/dL Final   AST 96/93/7974 68 (H)  15 - 41 U/L Final   ALT 01/18/2024 23  0 - 44 U/L Final   Alkaline Phosphatase 01/18/2024 111  38  - 126 U/L Final   Total Bilirubin 01/18/2024 1.4 (H)  0.0 - 1.2 mg/dL Final   GFR, Estimated 01/18/2024 >60  >60 mL/min Final   Comment: (NOTE) Calculated using the CKD-EPI Creatinine Equation (2021)    Anion gap 01/18/2024 17 (H)  5 - 15 Final   Performed at [facility], [address] [place], [place] [address]   Sodium 01/21/2024 134 (L)  135 - 145 mmol/L Final   Potassium 01/21/2024 4.1  3.5 - 5.1 mmol/L Final   Chloride 01/21/2024 99  98 - 111 mmol/L Final   CO2 01/21/2024 23  22 - 32 mmol/L Final   Glucose, Bld 01/21/2024 80  70 - 99 mg/dL Final   Glucose reference range applies only to samples taken after fasting for at least 8 hours.   BUN 01/21/2024 8  6 - 20 mg/dL Final   Creatinine, Ser 01/21/2024 0.69  0.61 - 1.24 mg/dL Final   Calcium  01/21/2024 9.9  8.9 - 10.3 mg/dL Final   Total Protein 96/90/7974 7.8  6.5 - 8.1 g/dL Final   Albumin 96/90/7974 3.8  3.5 - 5.0 g/dL Final   AST 96/90/7974 78 (H)  15 - 41 U/L Final   ALT 01/21/2024 33  0 - 44 U/L Final   Alkaline Phosphatase 01/21/2024 90  38 - 126 U/L Final   Total Bilirubin 01/21/2024 0.7  0.0 - 1.2 mg/dL Final   GFR, Estimated 01/21/2024 >60  >60 mL/min Final   Comment: (NOTE) Calculated using the CKD-EPI Creatinine Equation (2021)    Anion gap 01/21/2024 12  5 - 15 Final   Performed at [facility], [address], [place], [place] [address]   Prothrombin Time 01/21/2024 13.0  11.4 - 15.2 seconds Final   INR 01/21/2024 1.0  0.8 - 1.2 Final   Comment: (NOTE) INR goal varies based on device and disease states. Performed at  [facility], [address], [place], [place] [address]    WBC 01/21/2024 5.5  4.0 - 10.5 K/uL Final   RBC 01/21/2024 3.75 (L)  4.22 - 5.81 MIL/uL Final   Hemoglobin 01/21/2024 11.9 (L)  13.0 - 17.0 g/dL Final   HCT 96/90/7974 36.1 (L)  39.0 - 52.0 % Final   MCV 01/21/2024 96.3  80.0 - 100.0 fL Final   MCH 01/21/2024 31.7  26.0 - 34.0 pg Final   MCHC 01/21/2024 33.0  30.0  - 36.0 g/dL Final   RDW 96/90/7974 14.3  11.5 - 15.5 % Final   Platelets 01/21/2024 261  150 - 400 K/uL Final   nRBC 01/21/2024 0.0  0.0 - 0.2 % Final   Performed at [facility], [address] [place], [place] [address]   Magnesium  01/21/2024 1.9  1.7 - 2.4 mg/dL Final   Performed at [facility], [address] [place], [place] [address]  There may be more visits with results that are not included.    Allergies: Patient has no known allergies.  Medications:  Facility Ordered Medications  Medication   acetaminophen  (TYLENOL ) tablet 650 mg   alum & mag hydroxide-simeth (MAALOX/MYLANTA) 200-200-20 MG/5ML suspension 30 mL   magnesium  hydroxide (MILK OF MAGNESIA) suspension 30 mL   OLANZapine  zydis (ZYPREXA ) disintegrating tablet 5 mg   OLANZapine  (ZYPREXA ) injection 5 mg   OLANZapine  (ZYPREXA ) injection 10 mg   PTA Medications  Medication Sig   ferrous sulfate  325 (65 FE) MG tablet Take 1 tablet (325 mg total) by mouth at bedtime.   diclofenac  Sodium (VOLTAREN ) 1 % GEL Apply 2 g topically 4 (four) times daily. (Patient taking differently: Apply 2 g topically 4 (four) times daily as needed (pain).)   hydrOXYzine  (ATARAX ) 25 MG tablet Take 1 tablet (25 mg total) by mouth every 6 (six) hours as needed for anxiety (or CIWA score </= 10).   naltrexone  (DEPADE) 50 MG tablet Take 1 tablet (50 mg total) by mouth daily.   DULoxetine  (CYMBALTA ) 60 MG capsule Take 1 capsule (60 mg total) by mouth daily.   Cholecalciferol  (VITAMIN D -3 PO) Take 1 capsule by mouth daily.   Multiple Vitamins-Minerals (MULTIVITAMIN MEN) TABS Take 1 tablet by mouth daily.   thiamine  (VITAMIN B1) 100 MG tablet Take 100 mg by mouth daily.   atorvastatin  (LIPITOR) 40 MG tablet Take 1 tablet (40 mg total) by mouth daily.   QUEtiapine  (SEROQUEL ) 50 MG tablet Take 1 tablet (50 mg total) by mouth at bedtime.   traZODone  (DESYREL ) 50 MG tablet Take 1 tablet (50 mg total) by mouth at bedtime.   folic acid   (FOLVITE ) 1 MG tablet Take 1 tablet (1 mg total) by mouth daily.   lidocaine  (LIDODERM ) 5 % Place 1 patch onto the skin daily. Remove & Discard patch within 12 hours or as directed by MD   pantoprazole  (PROTONIX ) 40 MG tablet Take 1 tablet (40 mg total) by mouth daily.    Long Term Goals: Improvement in symptoms so as ready for discharge  Short Term Goals: Patient will verbalize feelings in meetings with treatment team members., Patient will attend at least of 50% of the groups daily., Pt will complete the PHQ9 on admission, day 3 and discharge., Patient will participate in completing the [place] Suicide Severity Rating Scale, Patient will score a low risk of violence for 24 hours prior to discharge, and Patient will take medications as prescribed daily.  Medical Decision Making  [facility]BC unit    Recommendations  Based on my evaluation the patient does not appear to have an emergency medical condition.  Gaither Pouch, NP 05/28/24  4:49 AM

## 2024-05-28 ENCOUNTER — Encounter: Payer: MEDICAID | Admitting: Student

## 2024-05-28 DIAGNOSIS — E785 Hyperlipidemia, unspecified: Secondary | ICD-10-CM | POA: Diagnosis not present

## 2024-05-28 DIAGNOSIS — F331 Major depressive disorder, recurrent, moderate: Secondary | ICD-10-CM | POA: Diagnosis not present

## 2024-05-28 DIAGNOSIS — F1010 Alcohol abuse, uncomplicated: Secondary | ICD-10-CM | POA: Diagnosis not present

## 2024-05-28 DIAGNOSIS — K219 Gastro-esophageal reflux disease without esophagitis: Secondary | ICD-10-CM | POA: Diagnosis not present

## 2024-05-28 LAB — CBC WITH DIFFERENTIAL/PLATELET
Abs Immature Granulocytes: 0.03 K/uL (ref 0.00–0.07)
Basophils Absolute: 0.1 K/uL (ref 0.0–0.1)
Basophils Relative: 1 %
Eosinophils Absolute: 0.1 K/uL (ref 0.0–0.5)
Eosinophils Relative: 1 %
HCT: 41.7 % (ref 39.0–52.0)
Hemoglobin: 13.9 g/dL (ref 13.0–17.0)
Immature Granulocytes: 0 %
Lymphocytes Relative: 18 %
Lymphs Abs: 1.5 K/uL (ref 0.7–4.0)
MCH: 28.6 pg (ref 26.0–34.0)
MCHC: 33.3 g/dL (ref 30.0–36.0)
MCV: 85.8 fL (ref 80.0–100.0)
Monocytes Absolute: 1.3 K/uL — ABNORMAL HIGH (ref 0.1–1.0)
Monocytes Relative: 16 %
Neutro Abs: 5.3 K/uL (ref 1.7–7.7)
Neutrophils Relative %: 64 %
Platelets: 346 K/uL (ref 150–400)
RBC: 4.86 MIL/uL (ref 4.22–5.81)
RDW: 14.3 % (ref 11.5–15.5)
WBC: 8.2 K/uL (ref 4.0–10.5)
nRBC: 0 % (ref 0.0–0.2)

## 2024-05-28 LAB — COMPREHENSIVE METABOLIC PANEL WITH GFR
ALT: 24 U/L (ref 0–44)
AST: 47 U/L — ABNORMAL HIGH (ref 15–41)
Albumin: 4.2 g/dL (ref 3.5–5.0)
Alkaline Phosphatase: 120 U/L (ref 38–126)
Anion gap: 17 — ABNORMAL HIGH (ref 5–15)
BUN: 7 mg/dL (ref 6–20)
CO2: 21 mmol/L — ABNORMAL LOW (ref 22–32)
Calcium: 10 mg/dL (ref 8.9–10.3)
Chloride: 94 mmol/L — ABNORMAL LOW (ref 98–111)
Creatinine, Ser: 0.93 mg/dL (ref 0.61–1.24)
GFR, Estimated: >60 mL/min (ref >60)
Glucose, Bld: 97 mg/dL (ref 70–99)
Potassium: 3.2 mmol/L — ABNORMAL LOW (ref 3.5–5.1)
Sodium: 132 mmol/L — ABNORMAL LOW (ref 135–145)
Total Bilirubin: 0.9 mg/dL (ref 0.0–1.2)
Total Protein: 7.9 g/dL (ref 6.5–8.1)

## 2024-05-28 LAB — TSH: TSH: 3.436 u[IU]/mL (ref 0.350–4.500)

## 2024-05-28 LAB — ETHANOL: Alcohol, Ethyl (B): <15 mg/dL (ref <15)

## 2024-05-28 MED ORDER — QUETIAPINE FUMARATE 50 MG PO TABS
50.0000 mg | ORAL_TABLET | Freq: Every day | ORAL | Status: DC
  Administered 2024-05-28: 50 mg via ORAL
  Filled 2024-05-28: qty 1

## 2024-05-28 MED ORDER — THIAMINE HCL 100 MG/ML IJ SOLN
100.0000 mg | Freq: Once | INTRAMUSCULAR | Status: AC
  Administered 2024-05-28: 100 mg via INTRAMUSCULAR
  Filled 2024-05-28: qty 2

## 2024-05-28 MED ORDER — ADULT MULTIVITAMIN W/MINERALS CH
1.0000 | ORAL_TABLET | Freq: Every day | ORAL | Status: DC
  Administered 2024-05-28: 1 via ORAL
  Filled 2024-05-28: qty 1

## 2024-05-28 MED ORDER — LOPERAMIDE HCL 2 MG PO CAPS: 2.0000 mg | ORAL_CAPSULE | ORAL | Status: AC | PRN

## 2024-05-28 MED ORDER — FERROUS SULFATE 325 (65 FE) MG PO TABS
325.0000 mg | ORAL_TABLET | Freq: Every day | ORAL | Status: DC
  Administered 2024-05-28 – 2024-05-30 (×3): 325 mg via ORAL
  Filled 2024-05-28 (×3): qty 1

## 2024-05-28 MED ORDER — PROPRANOLOL HCL 10 MG PO TABS
10.0000 mg | ORAL_TABLET | Freq: Two times a day (BID) | ORAL | Status: DC
  Administered 2024-05-28 – 2024-05-29 (×4): 10 mg via ORAL
  Filled 2024-05-28 (×5): qty 1

## 2024-05-28 MED ORDER — THIAMINE MONONITRATE 100 MG PO TABS
100.0000 mg | ORAL_TABLET | Freq: Every day | ORAL | Status: DC
  Administered 2024-05-29 – 2024-06-03 (×6): 100 mg via ORAL
  Filled 2024-05-28 (×6): qty 1

## 2024-05-28 MED ORDER — LORAZEPAM 1 MG PO TABS
1.0000 mg | ORAL_TABLET | Freq: Every day | ORAL | Status: AC
Start: 2024-05-31 — End: 2024-05-31
  Administered 2024-05-31: 1 mg via ORAL
  Filled 2024-05-28: qty 1

## 2024-05-28 MED ORDER — LORAZEPAM 1 MG PO TABS
1.0000 mg | ORAL_TABLET | Freq: Two times a day (BID) | ORAL | Status: AC
  Administered 2024-05-30 (×2): 1 mg via ORAL
  Filled 2024-05-28 (×3): qty 1

## 2024-05-28 MED ORDER — POTASSIUM CHLORIDE CRYS ER 20 MEQ PO TBCR
40.0000 meq | EXTENDED_RELEASE_TABLET | Freq: Once | ORAL | Status: AC
  Administered 2024-05-28: 40 meq via ORAL
  Filled 2024-05-28: qty 2

## 2024-05-28 MED ORDER — HYDROXYZINE HCL 25 MG PO TABS
25.0000 mg | ORAL_TABLET | Freq: Four times a day (QID) | ORAL | Status: AC | PRN
  Administered 2024-05-29: 25 mg via ORAL
  Filled 2024-05-28: qty 1

## 2024-05-28 MED ORDER — TRAZODONE HCL 50 MG PO TABS
50.0000 mg | ORAL_TABLET | Freq: Every day | ORAL | Status: DC
Start: 2024-05-28 — End: 2024-05-29
  Administered 2024-05-28: 50 mg via ORAL
  Filled 2024-05-28: qty 1

## 2024-05-28 MED ORDER — LORAZEPAM 1 MG PO TABS
1.0000 mg | ORAL_TABLET | Freq: Four times a day (QID) | ORAL | Status: AC
  Administered 2024-05-28 (×3): 1 mg via ORAL
  Filled 2024-05-28 (×3): qty 1

## 2024-05-28 MED ORDER — GABAPENTIN 300 MG PO CAPS
600.0000 mg | ORAL_CAPSULE | Freq: Three times a day (TID) | ORAL | Status: DC
  Administered 2024-05-28 – 2024-06-03 (×18): 600 mg via ORAL
  Filled 2024-05-28 (×18): qty 2

## 2024-05-28 MED ORDER — NALTREXONE HCL 50 MG PO TABS
50.0000 mg | ORAL_TABLET | Freq: Every day | ORAL | Status: DC
  Administered 2024-05-28 – 2024-06-03 (×7): 50 mg via ORAL
  Filled 2024-05-28 (×7): qty 1

## 2024-05-28 MED ORDER — PANTOPRAZOLE SODIUM 40 MG PO TBEC
40.0000 mg | DELAYED_RELEASE_TABLET | Freq: Every day | ORAL | Status: DC
  Administered 2024-05-28 – 2024-06-03 (×7): 40 mg via ORAL
  Filled 2024-05-28 (×7): qty 1

## 2024-05-28 MED ORDER — LORAZEPAM 1 MG PO TABS: 1.0000 mg | ORAL_TABLET | Freq: Four times a day (QID) | ORAL | Status: AC | PRN

## 2024-05-28 MED ORDER — LORAZEPAM 1 MG PO TABS
1.0000 mg | ORAL_TABLET | Freq: Three times a day (TID) | ORAL | Status: AC
  Administered 2024-05-29 (×3): 1 mg via ORAL
  Filled 2024-05-28 (×3): qty 1

## 2024-05-28 MED ORDER — ATORVASTATIN CALCIUM 40 MG PO TABS
40.0000 mg | ORAL_TABLET | Freq: Every evening | ORAL | Status: DC
Start: 2024-05-28 — End: 2024-06-02
  Administered 2024-05-28 – 2024-06-02 (×6): 40 mg via ORAL
  Filled 2024-05-28 (×6): qty 1

## 2024-05-28 MED ORDER — ADULT MULTIVITAMIN W/MINERALS CH
1.0000 | ORAL_TABLET | Freq: Every day | ORAL | Status: DC
  Administered 2024-05-29 – 2024-06-03 (×6): 1 via ORAL
  Filled 2024-05-28 (×6): qty 1

## 2024-05-28 MED ORDER — LORAZEPAM 2 MG/ML IJ SOLN
2.0000 mg | INTRAMUSCULAR | Status: AC
  Administered 2024-05-28: 2 mg via INTRAMUSCULAR
  Filled 2024-05-28: qty 1

## 2024-05-28 MED ORDER — ATORVASTATIN CALCIUM 40 MG PO TABS: 40.0000 mg | ORAL_TABLET | Freq: Every day | ORAL | Status: DC

## 2024-05-28 MED ORDER — FOLIC ACID 1 MG PO TABS
1.0000 mg | ORAL_TABLET | Freq: Every day | ORAL | Status: DC
Start: 2024-05-28 — End: 2024-06-03
  Administered 2024-05-28 – 2024-06-03 (×7): 1 mg via ORAL
  Filled 2024-05-28 (×7): qty 1

## 2024-05-28 MED ORDER — ONDANSETRON 4 MG PO TBDP
4.0000 mg | ORAL_TABLET | Freq: Four times a day (QID) | ORAL | Status: AC | PRN
  Administered 2024-05-28 – 2024-05-29 (×2): 4 mg via ORAL
  Filled 2024-05-28 (×2): qty 1

## 2024-05-28 MED ORDER — DULOXETINE HCL 60 MG PO CPEP
60.0000 mg | ORAL_CAPSULE | Freq: Every day | ORAL | Status: DC
  Administered 2024-05-28 – 2024-06-03 (×7): 60 mg via ORAL
  Filled 2024-05-28 (×7): qty 1

## 2024-05-28 NOTE — ED Notes (Signed)
 Pt was provided lunch

## 2024-05-28 NOTE — Group Note (Signed)
 Group Topic: Recovery Basics  Group Date: 05/28/2024 Start Time: 1000 End Time: 1030 Facilitators: Judi Monico RAMAN, NT  Department: [facility]  Number of Participants: 6  Group Focus: check in Treatment Modality:  Psychoeducation Interventions utilized were support Purpose: increase insight  Name: Xavier Owens Date of Birth: 06-15-1971  MR: [id number]    Level of Participation: Pt did not attend group.  Patients Problems:  Patient Active Problem List   Diagnosis Date Noted   Schatzki's ring 05/23/2024   Encounter to discuss test results 05/22/2024   Non-cardiac chest pain 05/22/2024   Esophageal dysphagia 05/22/2024   Abnormal loss of weight 05/22/2024   Abnormal finding on GI tract imaging 05/22/2024   Chest pain 05/21/2024   Elevated troponin 05/21/2024   Hypotension 05/21/2024   Chest discomfort 05/20/2024   Electrolyte abnormality 05/20/2024   Alcohol withdrawal (HCC) 05/09/2024   AKI (acute kidney injury) (HCC) 05/09/2024   Leukocytosis 05/09/2024   Heat exhaustion, water deprivation 05/09/2024   Major depression, recurrent (HCC) 04/03/2024   Reactive depression 03/22/2024   Alcohol withdrawal syndrome with complication (HCC) 03/22/2024   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   Dehydration 03/21/2024   Acute lactic acidosis 03/21/2024   Delirium tremens (HCC) 03/18/2024   Nausea & vomiting 03/16/2024   Alcohol abuse 03/16/2024   Tremor 03/16/2024   Neuropathy 03/16/2024   Mood disorder (HCC) 03/16/2024   Memory impairment 02/08/2024   Right knee pain 02/08/2024   Major depressive disorder, recurrent severe without psychotic features (HCC) 01/25/2024   Alcohol withdrawal seizure (HCC) 01/23/2024   Alcohol use disorder, severe, dependence (HCC) 01/18/2024   Healthcare maintenance 06/10/2022   Nightmares 12/21/2021   Subclinical hypothyroidism 12/21/2021   Substance induced mood disorder (HCC) 12/14/2021   Alcohol use disorder,  moderate, dependence (HCC) 06/23/2021   Seizures (HCC) 06/22/2021   Elevated liver enzymes 04/19/2016   Essential hypertension 04/19/2016

## 2024-05-28 NOTE — ED Notes (Signed)
 Pt has been resting much of shift.  OOB for meals.  Pt is taking in fluids well.  CIWA score 4. Reports Nausea, anxiety,  slight sweats. Safety maintained thus far this shift

## 2024-05-28 NOTE — ED Notes (Signed)
 Xavier Owens during shift change he was vomiting in the bathroom. We tried to reach a provider and get him medication but he said, I am okay I don't nee medication. No more vomiting now he is doing well.

## 2024-05-28 NOTE — Group Note (Signed)
 Group Topic: Social Support  Group Date: 05/28/2024 Start Time: 2000 End Time: 2100 Facilitators: Joan Plowman B  Department: [facility]  Number of Participants: 3  Group Focus: abuse issues, activities of daily living skills, check in, communication, coping skills, daily focus, goals/reality orientation, individual meeting, and relapse prevention Treatment Modality:  Individual Therapy Interventions utilized were leisure development, patient education, problem solving, and support Purpose: enhance coping skills, express feelings, increase insight, relapse prevention strategies, and trigger / craving management  Name: Xavier Owens Date of Birth: [birth year]  MR: [id number]    Level of Participation: PT DID NOT ATTEND GROUP HE WAS SICK.  Quality of Participation: cooperative Interactions with others: gave feedback Mood/Affect: appropriate Triggers (if applicable): NA Cognition: coherent/clear Progress: None Response: NA Plan: patient will be encouraged to go to group when he feels better.   Patients Problems:  Patient Active Problem List   Diagnosis Date Noted   Schatzki's ring 05/23/2024   Encounter to discuss test results 05/22/2024   Non-cardiac chest pain 05/22/2024   Esophageal dysphagia 05/22/2024   Abnormal loss of weight 05/22/2024   Abnormal finding on GI tract imaging 05/22/2024   Chest pain 05/21/2024   Elevated troponin 05/21/2024   Hypotension 05/21/2024   Chest discomfort 05/20/2024   Electrolyte abnormality 05/20/2024   Alcohol withdrawal (HCC) 05/09/2024   AKI (acute kidney injury) (HCC) 05/09/2024   Leukocytosis 05/09/2024   Heat exhaustion, water deprivation 05/09/2024   Major depression, recurrent (HCC) 04/03/2024   Reactive depression 03/22/2024   Alcohol withdrawal syndrome with complication (HCC) 03/22/2024   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   Dehydration 03/21/2024   Acute lactic acidosis 03/21/2024   Delirium  tremens (HCC) 03/18/2024   Nausea & vomiting 03/16/2024   Alcohol abuse 03/16/2024   Tremor 03/16/2024   Neuropathy 03/16/2024   Mood disorder (HCC) 03/16/2024   Memory impairment 02/08/2024   Right knee pain 02/08/2024   Major depressive disorder, recurrent severe without psychotic features (HCC) 01/25/2024   Alcohol withdrawal seizure (HCC) 01/23/2024   Alcohol use disorder, severe, dependence (HCC) 01/18/2024   Healthcare maintenance 06/10/2022   Nightmares 12/21/2021   Subclinical hypothyroidism 12/21/2021   Substance induced mood disorder (HCC) 12/14/2021   Alcohol use disorder, moderate, dependence (HCC) 06/23/2021   Seizures (HCC) 06/22/2021   Elevated liver enzymes 04/19/2016   Essential hypertension 04/19/2016

## 2024-05-28 NOTE — Discharge Planning (Signed)
 LCSW met with patient to assess current mood, affect, physical state, and inquire about needs/goals while here in [facility] and after discharge. Patient reports he presented due to alcohol dependence and wanting detox. Patient reports he has been drinking 1/5 of liquor daily. Patient lives with his wife and has an adult child of age 54. Patient does have a primary care physician and was taking medications prescribed. Patient stated he was not feeling well and asked if we could talk another time. He stated he was dizzy when he opened his eyes. SW made RN aware and will follow up with patient to discuss his goals for care following detox.  No other needs were reported at this time by patient.

## 2024-05-28 NOTE — ED Provider Notes (Signed)
 Facility Based Crisis Admission H&P  Date: 05/28/24 Patient Name: Xavier Owens MRN: [id number] Chief Complaint: SI & worsening alcohol dependence  Diagnoses:  Final diagnoses:  Hyperlipidemia, unspecified hyperlipidemia type  Gastroesophageal reflux disease without esophagitis  Alcohol use disorder  Moderate episode of recurrent major depressive disorder (HCC)  History of seizure due to alcohol withdrawal   HPI: Xavier Owens is a 53 y.o. male who [facility] [place] [facility] with worsening depressive symptoms & SI as well as alcohol withdrawals symptoms. Per triage:  Pt presents to [facility] unaccompanied. Pt states he is having withdrawls from alcohol starting this morning. Pt reports that he drank yesterday and all weekend. Pt mentions he had a fifth of alcohol yesterday. Pt mentions he is drinking every weekend.  Assessment and review of psychiatry symptoms: During encounter with patient, he reports that he has been drinking alcohol every day for at least the past month, drinks 1/5 of liquor daily specifically vodka, with his last alcoholic beverage the day prior to presenting to this facility.  Patient reports 4-5 seizures in the past related to alcohol use withdrawals.  Reports that the last seizure was while at this facility, which shortened his stay here, because he was transferred to the hospital, and ended up in ICU.  He denies any history of DTs, reports many blackouts in the past that were also related to alcohol use.  Patient reports starting alcohol use when he was 38-57 years old, reports that his use has been intermittent.  Patient denies any other substance use, denies nicotine  use.  Reports a history of being to rehab 3 times in the past, but unable to sustain his sobriety.  Patient verbalizes motivation this time around to go to rehab, with a goal of regaining his sobriety, and maintaining it.  Patient reports current stressors as his inability  to maintain sobriety, reports that another stressor is that he is separated currently from his wife, but they are working on their relationship.  Patient currently denies most depressive symptoms;reports that sleep is good on trazodone  and Seroquel  at night, reports that he would like to continue those medications during this hospitalization.  He denies that energy is an issue, denies that he has trouble with his concentration or motivation, reports that he was eating well at home prior to presenting to this hospital.  Denies mental clouding or psychomotor retardation.  He however reports guilt related to his alcohol use, reports anhedonia, feels like he cannot enjoy things that make him happy anymore, due to his worsening alcohol use.  Patient reports symptoms consistent with GAD; worrying excessively, restlessness, muscle tension, reports persistent alcohol use to self-medicate his symptoms.  Patient denies symptoms significant for any other mental health conditions such as bipolar 1 or 2 disorders, denies symptoms significant for OCD, denies any emotional, physical, or sexual abuse in the past.  Denies PTSD type symptoms, denies psychosis; specifically denies auditory, visual, or tactile hallucinations at times, pleasant, or most recently.  Denies paranoia, denies delusional thinking, there are no overt signs of psychosis.  Patient denies first rank symptoms, denies a history of such in the past.  Patient reports a history of hypercholesterolemia, hypertension, denies any other medical conditions.  Reports that his home medications consist of Seroquel , trazodone , gabapentin , states that medications were helpful at home.  Reports that her medications were helpful, reports mental health diagnosis of GAD, MDD, and insomnia.  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  Denies any legal issues,  reports that highest level of education is high school, denies any legal problems.  Reports current withdrawal symptoms and has generalized malaise, sensitivity to light, muscle cramping.  Patient medicated with 2 mg IM Ativan  given that he has a history of alcohol withdrawal seizures and detox protocol was not started until this morning.  Pt with flat affect and depressed mood, attention to personal hygiene and grooming is fair, eye contact is good, speech is clear & coherent. Thought contents are organized and logical, and pt currently denies SI/HI/AVH or paranoia. There is no evidence of delusional thoughts.   Minimal: No identifiable suicidal ideation.  Patients presenting with no risk factors but with morbid ruminations; may be classified as minimal risk based on the severity of the depressive symptoms. Patient denies intent or plan to harm self or others.  PHQ 2-9:  Flowsheet Row ED from 05/27/2024 in [facility] Office Visit from 04/22/2024 in [facility] ED from 01/18/2024 in [facility]  Thoughts that you would be better off dead, or of hurting yourself in some way Not at all Not at all Not at all  PHQ-9 Total Score 12 0 3    Flowsheet Row ED from 05/27/2024 in [facility] Most recent reading at 05/27/2024 11:10 PM ED from 05/27/2024 in [facility] Most recent reading at 05/27/2024  6:22 PM ED to Hosp-Admission (Discharged) from 05/20/2024 in [facility] Most recent reading at 05/20/2024 10:08 AM  C-SSRS RISK CATEGORY No Risk No Risk No Risk    Screenings    Flowsheet Row Most Recent Value  CIWA-Ar Total 1    Total Time spent with patient: 1.5 hours  Musculoskeletal  Strength & Muscle Tone: within normal limits Gait & Station: normal Patient leans: N/A  Psychiatric Specialty Exam  Presentation General Appearance:   Casual  Eye Contact: Good  Speech: Clear and Coherent  Speech Volume: Normal  Handedness: Right   Mood and Affect  Mood: Anxious  Affect: Congruent   Thought Process  Thought Processes: Coherent  Descriptions of Associations:Intact  Orientation:Full (Time, Place and Person)  Thought Content:Logical  Diagnosis of Schizophrenia or Schizoaffective disorder in past: No   Hallucinations:Hallucinations: None  Ideas of Reference:None  Suicidal Thoughts:Suicidal Thoughts: No  Homicidal Thoughts:Homicidal Thoughts: No   Sensorium  Memory: Immediate Fair  Judgment: Fair  Insight: Fair   Art therapist  Concentration: Fair  Attention Span: Fair  Recall: [employer] of Knowledge: Fair  Language: Fair   Psychomotor Activity  Psychomotor Activity: Psychomotor Activity: Normal   Assets  Assets: Desire for Improvement; Resilience   Sleep  Sleep: Sleep: Fair Number of Hours of Sleep: 8   Nutritional Assessment (For OBS and FBC admissions only) Has the patient had a weight loss or gain of 10 pounds or more in the last 3 months?: No Has the patient had a decrease in food intake/or appetite?: No Does the patient have dental problems?: No Does the patient have eating habits or behaviors that may be indicators of an eating disorder including binging or inducing vomiting?: No Has the patient recently lost weight without trying?: 0 Has the patient been eating poorly because of a decreased appetite?: 0 Malnutrition Screening Tool Score: 0    Physical Exam ROS  Blood pressure (!) 145/91, pulse 95,  temperature 98.6 F (37 C), temperature source Oral, resp. rate 18, SpO2 97%. There is no height or weight on file to calculate BMI.  Past Psychiatric History: MDD, GAD   Is the patient at risk to self? Yes  Has the patient been a risk to self in the past 6 months? Yes .    Has the patient been a risk to self within the distant past?  Yes   Is the patient a risk to others? No   Has the patient been a risk to others in the past 6 months? No   Has the patient been a risk to others within the distant past? No   Past Medical History: Hypercholesteremia, htn Family History: denies  Social History: denies   Last Labs:  Admission on 05/27/2024  Component Date Value Ref Range Status   WBC 05/27/2024 8.2  4.0 - 10.5 K/uL Final   RBC 05/27/2024 4.86  4.22 - 5.81 MIL/uL Final   Hemoglobin 05/27/2024 13.9  13.0 - 17.0 g/dL Final   HCT 92/85/7974 41.7  39.0 - 52.0 % Final   MCV 05/27/2024 85.8  80.0 - 100.0 fL Final   MCH 05/27/2024 28.6  26.0 - 34.0 pg Final   MCHC 05/27/2024 33.3  30.0 - 36.0 g/dL Final   RDW 92/85/7974 14.3  11.5 - 15.5 % Final   Platelets 05/27/2024 346  150 - 400 K/uL Final   nRBC 05/27/2024 0.0  0.0 - 0.2 % Final   Neutrophils Relative % 05/27/2024 64  % Final   Neutro Abs 05/27/2024 5.3  1.7 - 7.7 K/uL Final   Lymphocytes Relative 05/27/2024 18  % Final   Lymphs Abs 05/27/2024 1.5  0.7 - 4.0 K/uL Final   Monocytes Relative 05/27/2024 16  % Final   Monocytes Absolute 05/27/2024 1.3 (H)  0.1 - 1.0 K/uL Final   Eosinophils Relative 05/27/2024 1  % Final   Eosinophils Absolute 05/27/2024 0.1  0.0 - 0.5 K/uL Final   Basophils Relative 05/27/2024 1  % Final   Basophils Absolute 05/27/2024 0.1  0.0 - 0.1 K/uL Final   Immature Granulocytes 05/27/2024 0  % Final   Abs Immature Granulocytes 05/27/2024 0.03  0.00 - 0.07 K/uL Final   Performed at [facility], [address] [place], [place] [address]   Sodium 05/27/2024 132 (L)  135 - 145 mmol/L Final   Potassium 05/27/2024 3.2 (L)  3.5 - 5.1 mmol/L Final   Chloride 05/27/2024 94 (L)  98 - 111 mmol/L Final   CO2 05/27/2024 21 (L)  22 - 32 mmol/L Final   Glucose, Bld 05/27/2024 97  70 - 99 mg/dL Final   Glucose reference range applies only to samples taken after fasting for at least 8 hours.   BUN 05/27/2024 7  6 - 20 mg/dL Final   Creatinine, Ser  05/27/2024 0.93  0.61 - 1.24 mg/dL Final   Calcium  05/27/2024 10.0  8.9 - 10.3 mg/dL Final   Total Protein 92/85/7974 7.9  6.5 - 8.1 g/dL Final   Albumin 92/85/7974 4.2  3.5 - 5.0 g/dL Final   AST 92/85/7974 47 (H)  15 - 41 U/L Final   ALT 05/27/2024 24  0 - 44 U/L Final   Alkaline Phosphatase 05/27/2024 120  38 - 126 U/L Final   Total Bilirubin 05/27/2024 0.9  0.0 - 1.2 mg/dL Final   GFR, Estimated 05/27/2024 >60  >60 mL/min Final   Comment: (NOTE) Calculated using the CKD-EPI Creatinine Equation (2021)  Anion gap 05/27/2024 17 (H)  5 - 15 Final   Performed at [facility], [address] [place], [place] [address]   Alcohol, Ethyl (B) 05/27/2024 <15  <15 mg/dL Final   Comment: (NOTE) For medical purposes only. Performed at [facility], [address] [place], [place] [address]    TSH 05/27/2024 3.436  0.350 - 4.500 uIU/mL Final   Comment: Performed by a 3rd Generation assay with a functional sensitivity of <=0.01 uIU/mL. Performed at [facility], [address] [place], [place] [address]   Admission on 05/20/2024, Discharged on 05/24/2024  Component Date Value Ref Range Status   WBC 05/20/2024 15.9 (H)  4.0 - 10.5 K/uL Final   RBC 05/20/2024 4.91  4.22 - 5.81 MIL/uL Final   Hemoglobin 05/20/2024 13.8  13.0 - 17.0 g/dL Final   HCT 92/92/7974 42.8  39.0 - 52.0 % Final   MCV 05/20/2024 87.2  80.0 - 100.0 fL Final   MCH 05/20/2024 28.1  26.0 - 34.0 pg Final   MCHC 05/20/2024 32.2  30.0 - 36.0 g/dL Final   RDW 92/92/7974 14.2  11.5 - 15.5 % Final   Platelets 05/20/2024 353  150 - 400 K/uL Final   nRBC 05/20/2024 0.0  0.0 - 0.2 % Final   Performed at [facility], [address], [place], [place] [address]   Sodium 05/20/2024 135  135 - 145 mmol/L Final   Potassium 05/20/2024 3.3 (L)  3.5 - 5.1 mmol/L Final   Chloride 05/20/2024 99  98 - 111 mmol/L Final   CO2 05/20/2024 11 (L)  22 - 32 mmol/L Final   Glucose, Bld 05/20/2024 87  70 - 99 mg/dL Final    Glucose reference range applies only to samples taken after fasting for at least 8 hours.   BUN 05/20/2024 5 (L)  6 - 20 mg/dL Final   Creatinine, Ser 05/20/2024 1.16  0.61 - 1.24 mg/dL Final   Calcium  05/20/2024 9.5  8.9 - 10.3 mg/dL Final   Total Protein 92/92/7974 7.9  6.5 - 8.1 g/dL Final   Albumin 92/92/7974 3.8  3.5 - 5.0 g/dL Final   AST 92/92/7974 52 (H)  15 - 41 U/L Final   ALT 05/20/2024 21  0 - 44 U/L Final   Alkaline Phosphatase 05/20/2024 97  38 - 126 U/L Final   Total Bilirubin 05/20/2024 2.3 (H)  0.0 - 1.2 mg/dL Final   GFR, Estimated 05/20/2024 >60  >60 mL/min Final   Comment: (NOTE) Calculated using the CKD-EPI Creatinine Equation (2021)    Anion gap 05/20/2024 25 (H)  5 - 15 Corrected   Comment: REPEATED TO VERIFY Performed at [facility], [address] [place], [place] [address] CORRECTED ON 07/07 AT 1105: PREVIOUSLY REPORTED AS 25 CRITICAL RESULT CALLED TO, READ BACK BY AND VERIFIED WITH DEVOLT,TRAVIS RN 1047 05/20/24 AMIREHSNAIF REPEATED TO VERIFY    Lipase 05/20/2024 32  11 - 51 U/L Final   Performed at [facility], [address] [place], [place] [address]   Troponin I (High Sensitivity) 05/20/2024 131 (HH)  <18 ng/L Final   Comment: CRITICAL RESULT CALLED TO, READ BACK BY AND VERIFIED WITH DEVOLT,TRAVIS RN 1047 05/20/24 AMIREHSNAIF (NOTE) Elevated high sensitivity troponin I (hsTnI) values and significant  changes across serial measurements may suggest ACS but many other  chronic and acute conditions are known to elevate hsTnI results.  Refer to the Links section for chest pain  algorithms and additional  guidance. Performed at [facility], [address], [place], [place] [address]    Troponin I (High Sensitivity) 05/20/2024 76 (H)  <18 ng/L Final   Comment: (NOTE) Elevated high sensitivity troponin I (hsTnI) values and significant  changes across serial measurements may suggest ACS but many other  chronic and acute conditions are  known to elevate hsTnI results.  Refer to the Links section for chest pain algorithms and additional  guidance. Performed at [facility], [address], [place], [place] [address]    pH, Ven 05/20/2024 7.443 (H)  7.25 - 7.43 Final   pCO2, Ven 05/20/2024 22.4 (L)  44 - 60 mmHg Final   pO2, Ven 05/20/2024 71 (H)  32 - 45 mmHg Final   Bicarbonate 05/20/2024 15.3 (L)  20.0 - 28.0 mmol/L Final   TCO2 05/20/2024 16 (L)  22 - 32 mmol/L Final   O2 Saturation 05/20/2024 95  % Final   Acid-base deficit 05/20/2024 6.0 (H)  0.0 - 2.0 mmol/L Final   Sodium 05/20/2024 128 (L)  135 - 145 mmol/L Final   Potassium 05/20/2024 5.8 (H)  3.5 - 5.1 mmol/L Final   Calcium , Ion 05/20/2024 0.94 (L)  1.15 - 1.40 mmol/L Final   HCT 05/20/2024 48.0  39.0 - 52.0 % Final   Hemoglobin 05/20/2024 16.3  13.0 - 17.0 g/dL Final   Sample type 92/92/7974 VENOUS   Final   Color, Urine 05/20/2024 AMBER (A)  YELLOW Final   BIOCHEMICALS MAY BE AFFECTED BY COLOR   APPearance 05/20/2024 HAZY (A)  CLEAR Final   Specific Gravity, Urine 05/20/2024 1.026  1.005 - 1.030 Final   pH 05/20/2024 5.0  5.0 - 8.0 Final   Glucose, UA 05/20/2024 NEGATIVE  NEGATIVE mg/dL Final   Hgb urine dipstick 05/20/2024 SMALL (A)  NEGATIVE Final   Bilirubin Urine 05/20/2024 NEGATIVE  NEGATIVE Final   Ketones, ur 05/20/2024 80 (A)  NEGATIVE mg/dL Final   Protein, ur 92/92/7974 100 (A)  NEGATIVE mg/dL Final   Nitrite 92/92/7974 NEGATIVE  NEGATIVE Final   Leukocytes,Ua 05/20/2024 NEGATIVE  NEGATIVE Final   RBC / HPF 05/20/2024 0-5  0 - 5 RBC/hpf Final   WBC, UA 05/20/2024 0-5  0 - 5 WBC/hpf Final   Bacteria, UA 05/20/2024 RARE (A)  NONE SEEN Final   Squamous Epithelial / HPF 05/20/2024 0-5  0 - 5 /HPF Final   Mucus 05/20/2024 PRESENT   Final   Hyaline Casts, UA 05/20/2024 PRESENT   Final   Performed at [facility], [address], [place], [place] [address]   Osmolality 05/20/2024 279  275 - 295 mOsm/kg Final   Comment: REPEATED TO  VERIFY Performed at [facility], [address] [place], [place] [address]    Weight 05/20/2024 2,528  oz Final   Height 05/20/2024 68  in Final   BP 05/20/2024 122/71  mmHg Final   S' Lateral 05/20/2024 2.50  cm Final   Area-P 1/2 05/20/2024 4.96  cm2 Final   Est EF 05/20/2024 60 - 65%   Final   Cholesterol 05/20/2024 208 (H)  0 - 200 mg/dL Final   Triglycerides 92/92/7974 168 (H)  <150 mg/dL Final   HDL 92/92/7974 79  >40 mg/dL Final   Total CHOL/HDL Ratio 05/20/2024 2.6  RATIO Final   VLDL 05/20/2024 34  0 - 40 mg/dL Final   LDL Cholesterol 05/20/2024 95  0 - 99 mg/dL Final   Comment:  Total Cholesterol/HDL:CHD Risk Coronary Heart Disease Risk Table                     Men   Women  1/2 Average Risk   3.4   3.3  Average Risk       5.0   4.4  2 X Average Risk   9.6   7.1  3 X Average Risk  23.4   11.0        Use the calculated Patient Ratio above and the CHD Risk Table to determine the patient's CHD Risk.        ATP III CLASSIFICATION (LDL):  <100     mg/dL   Optimal  899-870  mg/dL   Near or Above                    Optimal  130-159  mg/dL   Borderline  839-810  mg/dL   High  >809     mg/dL   Very High Performed at [facility], [address], [place], [place] [address]    Hgb A1c MFr Bld 05/20/2024 4.4 (L)  4.8 - 5.6 % Final   Comment: (NOTE) Diagnosis of Diabetes The following HbA1c ranges recommended by the American Diabetes Association (ADA) may be used as an aid in the diagnosis of diabetes mellitus.  Hemoglobin             Suggested A1C NGSP%              Diagnosis  <5.7                   Non Diabetic  5.7-6.4                Pre-Diabetic  >6.4                   Diabetic  <7.0                   Glycemic control for                       adults with diabetes.     Mean Plasma Glucose 05/20/2024 79.58  mg/dL Final   Performed at [facility], [address] [place], [place] [address]   CRP 05/20/2024 3.3 (H)  <1.0 mg/dL Final    Performed at [facility], [address], [place], [place] [address]   Sed Rate 05/20/2024 4  0 - 16 mm/hr Final   Performed at [facility], [address] [place], [place] [address]   Alcohol, Ethyl (B) 05/20/2024 <15  <15 mg/dL Final   Comment: (NOTE) For medical purposes only. Performed at [facility], [address], [place], [place] [address]    Opiates 05/21/2024 NONE DETECTED  NONE DETECTED Final   Cocaine 05/21/2024 NONE DETECTED  NONE DETECTED Final   Benzodiazepines 05/21/2024 NONE DETECTED  NONE DETECTED Final   Amphetamines 05/21/2024 NONE DETECTED  NONE DETECTED Final   Tetrahydrocannabinol 05/21/2024 NONE DETECTED  NONE DETECTED Final   Barbiturates 05/21/2024 NONE DETECTED  NONE DETECTED Final   Comment: (NOTE) DRUG SCREEN FOR MEDICAL PURPOSES ONLY.  IF CONFIRMATION IS NEEDED FOR ANY PURPOSE, NOTIFY LAB WITHIN 5 DAYS.  LOWEST DETECTABLE LIMITS FOR URINE DRUG SCREEN Drug Class  Cutoff (ng/mL) Amphetamine and metabolites    1000 Barbiturate and metabolites    200 Benzodiazepine                 200 Opiates and metabolites        300 Cocaine and metabolites        300 THC                            50 Performed at [name], [address] [place], [place] [address]    Sodium 05/21/2024 134 (L)  135 - 145 mmol/L Final   Potassium 05/21/2024 3.1 (L)  3.5 - 5.1 mmol/L Final   Chloride 05/21/2024 103  98 - 111 mmol/L Final   CO2 05/21/2024 23  22 - 32 mmol/L Final   Glucose, Bld 05/21/2024 116 (H)  70 - 99 mg/dL Final   Glucose reference range applies only to samples taken after fasting for at least 8 hours.   BUN 05/21/2024 7  6 - 20 mg/dL Final   Creatinine, Ser 05/21/2024 0.91  0.61 - 1.24 mg/dL Final   Calcium  05/21/2024 9.0  8.9 - 10.3 mg/dL Final   GFR, Estimated 05/21/2024 >60  >60 mL/min Final   Comment: (NOTE) Calculated using the CKD-EPI Creatinine Equation (2021)    Anion gap 05/21/2024 8  5 - 15  Final   Performed at [facility], [address], [place], [place] [address]   Lactic Acid, Venous 05/20/2024 2.4 (HH)  0.5 - 1.9 mmol/L Final   Comment: CRITICAL RESULT CALLED TO, READ BACK BY AND VERIFIED WITH FABIENE BOWLER, RN AT 2128 07.07.25 JLASIGAN Performed at [facility], [address] [place], [place] [address]    WBC 05/21/2024 6.2  4.0 - 10.5 K/uL Final   RBC 05/21/2024 4.54  4.22 - 5.81 MIL/uL Final   Hemoglobin 05/21/2024 12.9 (L)  13.0 - 17.0 g/dL Final   HCT 92/91/7974 37.9 (L)  39.0 - 52.0 % Final   MCV 05/21/2024 83.5  80.0 - 100.0 fL Final   MCH 05/21/2024 28.4  26.0 - 34.0 pg Final   MCHC 05/21/2024 34.0  30.0 - 36.0 g/dL Final   RDW 92/91/7974 14.5  11.5 - 15.5 % Final   Platelets 05/21/2024 270  150 - 400 K/uL Final   nRBC 05/21/2024 0.0  0.0 - 0.2 % Final   Performed at [facility], [address] [place], [place] [address]   Lactic Acid, Venous 05/20/2024 2.4 (HH)  0.5 - 1.9 mmol/L Final   Comment: CRITICAL VALUE NOTED. VALUE IS CONSISTENT WITH PREVIOUSLY REPORTED/CALLED VALUE Performed at [facility], [address] [place], [place] [address]    Lactic Acid, Venous 05/21/2024 1.6  0.5 - 1.9 mmol/L Final   Performed at [facility], [address], [place], [place] [address]   Magnesium  05/21/2024 1.7  1.7 - 2.4 mg/dL Final   Performed at [facility], [address] [place], [place] [address]   WBC 05/22/2024 6.1  4.0 - 10.5 K/uL Final   RBC 05/22/2024 3.83 (L)  4.22 - 5.81 MIL/uL Final   Hemoglobin 05/22/2024 11.0 (L)  13.0 - 17.0 g/dL Final   HCT 92/90/7974 33.0 (L)  39.0 - 52.0 % Final   MCV 05/22/2024 86.2  80.0 - 100.0 fL Final   MCH 05/22/2024 28.7  26.0 - 34.0 pg Final   MCHC  05/22/2024 33.3  30.0 - 36.0 g/dL Final   RDW 92/90/7974 14.4  11.5 - 15.5 % Final   Platelets 05/22/2024 219  150 - 400 K/uL Final   nRBC 05/22/2024 0.0  0.0 - 0.2 % Final   Performed at [facility], [address],  [place], [place] [address]   Sodium 05/22/2024 137  135 - 145 mmol/L Final   Potassium 05/22/2024 3.6  3.5 - 5.1 mmol/L Final   Chloride 05/22/2024 107  98 - 111 mmol/L Final   CO2 05/22/2024 24  22 - 32 mmol/L Final   Glucose, Bld 05/22/2024 100 (H)  70 - 99 mg/dL Final   Glucose reference range applies only to samples taken after fasting for at least 8 hours.   BUN 05/22/2024 5 (L)  6 - 20 mg/dL Final   Creatinine, Ser 05/22/2024 0.75  0.61 - 1.24 mg/dL Final   Calcium  05/22/2024 8.1 (L)  8.9 - 10.3 mg/dL Final   GFR, Estimated 05/22/2024 >60  >60 mL/min Final   Comment: (NOTE) Calculated using the CKD-EPI Creatinine Equation (2021)    Anion gap 05/22/2024 6  5 - 15 Final   Performed at [facility] Lab, [address] [place], [place] [address]   WBC 05/23/2024 6.3  4.0 - 10.5 K/uL Final   RBC 05/23/2024 4.58  4.22 - 5.81 MIL/uL Final   Hemoglobin 05/23/2024 12.8 (L)  13.0 - 17.0 g/dL Final   HCT 92/89/7974 39.5  39.0 - 52.0 % Final   MCV 05/23/2024 86.2  80.0 - 100.0 fL Final   MCH 05/23/2024 27.9  26.0 - 34.0 pg Final   MCHC 05/23/2024 32.4  30.0 - 36.0 g/dL Final   RDW 92/89/7974 14.6  11.5 - 15.5 % Final   Platelets 05/23/2024 252  150 - 400 K/uL Final   nRBC 05/23/2024 0.0  0.0 - 0.2 % Final   Performed at [facility] Lab, [address], [place], [place] [address]   Sodium 05/23/2024 139  135 - 145 mmol/L Final   Potassium 05/23/2024 3.4 (L)  3.5 - 5.1 mmol/L Final   Chloride 05/23/2024 102  98 - 111 mmol/L Final   CO2 05/23/2024 22  22 - 32 mmol/L Final   Glucose, Bld 05/23/2024 92  70 - 99 mg/dL Final   Glucose reference range applies only to samples taken after fasting for at least 8 hours.   BUN 05/23/2024 <5 (L)  6 - 20 mg/dL Final   Creatinine, Ser 05/23/2024 0.64  0.61 - 1.24 mg/dL Final   Calcium  05/23/2024 9.7  8.9 - 10.3 mg/dL Final   GFR, Estimated 05/23/2024 >60  >60 mL/min Final   Comment: (NOTE) Calculated using the CKD-EPI Creatinine Equation (2021)     Anion gap 05/23/2024 15  5 - 15 Final   Performed at [facility], [address], [place], [place] [address]   Magnesium  05/23/2024 1.6 (L)  1.7 - 2.4 mg/dL Final   Performed at [facility], [address], [place], [place] [address]   WBC 05/24/2024 5.1  4.0 - 10.5 K/uL Final   RBC 05/24/2024 4.36  4.22 - 5.81 MIL/uL Final   Hemoglobin 05/24/2024 12.6 (L)  13.0 - 17.0 g/dL Final   HCT 92/88/7974 38.0 (L)  39.0 - 52.0 % Final   MCV 05/24/2024 87.2  80.0 - 100.0 fL Final   MCH 05/24/2024 28.9  26.0 - 34.0 pg Final   MCHC 05/24/2024 33.2  30.0 - 36.0 g/dL Final   RDW 92/88/7974 14.7  11.5 - 15.5 % Final   Platelets 05/24/2024 237  150 - 400 K/uL Final   nRBC 05/24/2024 0.0  0.0 - 0.2 % Final   Performed at [facility], [address], [place], [place] [address]   Sodium 05/24/2024 136  135 - 145 mmol/L Final   Potassium 05/24/2024 3.6  3.5 - 5.1 mmol/L Final   Chloride 05/24/2024 103  98 - 111 mmol/L Final   CO2 05/24/2024 22  22 - 32 mmol/L Final   Glucose, Bld 05/24/2024 100 (H)  70 - 99 mg/dL Final   Glucose reference range applies only to samples taken after fasting for at least 8 hours.   BUN 05/24/2024 6  6 - 20 mg/dL Final   Creatinine, Ser 05/24/2024 0.61  0.61 - 1.24 mg/dL Final   Calcium  05/24/2024 9.4  8.9 - 10.3 mg/dL Final   Phosphorus 92/88/7974 4.9 (H)  2.5 - 4.6 mg/dL Final   Albumin 92/88/7974 3.3 (L)  3.5 - 5.0 g/dL Final   GFR, Estimated 05/24/2024 >60  >60 mL/min Final   Comment: (NOTE) Calculated using the CKD-EPI Creatinine Equation (2021)    Anion gap 05/24/2024 11  5 - 15 Final   Performed at [facility], [address], [place], [place] [address]  Admission on 05/08/2024, Discharged on 05/10/2024  Component Date Value Ref Range Status   Glucose-Capillary 05/08/2024 105 (H)  70 - 99 mg/dL Final   Glucose reference range applies only to samples taken after fasting for at least 8 hours.   Troponin I (High Sensitivity) 05/08/2024 6   <18 ng/L Final   Comment: (NOTE) Elevated high sensitivity troponin I (hsTnI) values and significant  changes across serial measurements may suggest ACS but many other  chronic and acute conditions are known to elevate hsTnI results.  Refer to the Links section for chest pain algorithms and additional  guidance. Performed at [facility], [address], [place], [place] [address]    WBC 05/08/2024 13.3 (H)  4.0 - 10.5 K/uL Final   RBC 05/08/2024 5.23  4.22 - 5.81 MIL/uL Final   Hemoglobin 05/08/2024 14.8  13.0 - 17.0 g/dL Final   HCT 93/74/7974 46.3  39.0 - 52.0 % Final   MCV 05/08/2024 88.5  80.0 - 100.0 fL Final   MCH 05/08/2024 28.3  26.0 - 34.0 pg Final   MCHC 05/08/2024 32.0  30.0 - 36.0 g/dL Final   RDW 93/74/7974 14.4  11.5 - 15.5 % Final   Platelets 05/08/2024 335  150 - 400 K/uL Final   nRBC 05/08/2024 0.0  0.0 - 0.2 % Final   Performed at [facility], [address], [place], [place] [address]   Sodium 05/08/2024 142  135 - 145 mmol/L Final   Potassium 05/08/2024 4.2  3.5 - 5.1 mmol/L Final   Chloride 05/08/2024 103  98 - 111 mmol/L Final   CO2 05/08/2024 10 (L)  22 - 32 mmol/L Final   Glucose, Bld 05/08/2024 99  70 - 99 mg/dL Final   Glucose reference range applies only to samples taken after fasting for at least 8 hours.   BUN 05/08/2024 8  6 - 20 mg/dL Final   Creatinine, Ser 05/08/2024 1.31 (H)  0.61 - 1.24 mg/dL Final   Calcium  05/08/2024 10.3  8.9 - 10.3 mg/dL Final   Total Protein 93/74/7974 9.4 (H)  6.5 - 8.1 g/dL Final  Albumin 05/08/2024 4.8  3.5 - 5.0 g/dL Final   AST 93/74/7974 35  15 - 41 U/L Final   ALT 05/08/2024 15  0 - 44 U/L Final   Alkaline Phosphatase 05/08/2024 109  38 - 126 U/L Final   Total Bilirubin 05/08/2024 2.4 (H)  0.0 - 1.2 mg/dL Final   GFR, Estimated 05/08/2024 >60  >60 mL/min Final   Comment: (NOTE) Calculated using the CKD-EPI Creatinine Equation (2021)    Anion gap 05/08/2024 29 (H)  5 - 15 Final   Comment:  ELECTROLYTES REPEATED TO VERIFY Performed at [facility], [address], [place], [place] [address]    Troponin I (High Sensitivity) 05/08/2024 7  <18 ng/L Final   Comment: (NOTE) Elevated high sensitivity troponin I (hsTnI) values and significant  changes across serial measurements may suggest ACS but many other  chronic and acute conditions are known to elevate hsTnI results.  Refer to the Links section for chest pain algorithms and additional  guidance. Performed at [facility], [address] [place], [place] [address]    Lipase 05/08/2024 34  11 - 51 U/L Final   Performed at [facility], [address], [place], [place] [address]   Alcohol, Ethyl (B) 05/09/2024 <15  <15 mg/dL Final   Comment: (NOTE) For medical purposes only. Performed at [facility], [address], [place], [place] [address]    Lactic Acid, Venous 05/09/2024 1.2  0.5 - 1.9 mmol/L Final   Performed at [facility], [address] [place], [place] [address]   Color, Urine 05/09/2024 YELLOW  YELLOW Final   APPearance 05/09/2024 CLEAR  CLEAR Final   Specific Gravity, Urine 05/09/2024 1.039 (H)  1.005 - 1.030 Final   pH 05/09/2024 5.0  5.0 - 8.0 Final   Glucose, UA 05/09/2024 NEGATIVE  NEGATIVE mg/dL Final   Hgb urine dipstick 05/09/2024 MODERATE (A)  NEGATIVE Final   Bilirubin Urine 05/09/2024 NEGATIVE  NEGATIVE Final   Ketones, ur 05/09/2024 80 (A)  NEGATIVE mg/dL Final   Protein, ur 93/73/7974 >=300 (A)  NEGATIVE mg/dL Final   Nitrite 93/73/7974 NEGATIVE  NEGATIVE Final   Leukocytes,Ua 05/09/2024 NEGATIVE  NEGATIVE Final   RBC / HPF 05/09/2024 6-10  0 - 5 RBC/hpf Final   WBC, UA 05/09/2024 0-5  0 - 5 WBC/hpf Final   Bacteria, UA 05/09/2024 NONE SEEN  NONE SEEN Final   Squamous Epithelial / HPF 05/09/2024 6-10  0 - 5 /HPF Final   Mucus 05/09/2024 PRESENT   Final   Performed at [facility], [address], [place], [place] [address]   Sodium 05/09/2024 132 (L)   135 - 145 mmol/L Final   DELTA CHECK NOTED   Potassium 05/09/2024 3.6  3.5 - 5.1 mmol/L Final   Chloride 05/09/2024 102  98 - 111 mmol/L Final   CO2 05/09/2024 21 (L)  22 - 32 mmol/L Final   Glucose, Bld 05/09/2024 88  70 - 99 mg/dL Final   Glucose reference range applies only to samples taken after fasting for at least 8 hours.   BUN 05/09/2024 6  6 - 20 mg/dL Final   Creatinine, Ser 05/09/2024 0.74  0.61 - 1.24 mg/dL Final   Calcium  05/09/2024 9.5  8.9 - 10.3 mg/dL Final   GFR, Estimated 05/09/2024 >60  >60 mL/min Final   Comment: (NOTE) Calculated using the CKD-EPI Creatinine Equation (2021)    Anion gap 05/09/2024 9  5 -  15 Final   Performed at [facility], [address], [place], [place] [address]   Total Protein 05/09/2024 7.2  6.5 - 8.1 g/dL Final   Albumin 93/73/7974 3.8  3.5 - 5.0 g/dL Final   AST 93/73/7974 32  15 - 41 U/L Final   ALT 05/09/2024 15  0 - 44 U/L Final   Alkaline Phosphatase 05/09/2024 92  38 - 126 U/L Final   Total Bilirubin 05/09/2024 1.8 (H)  0.0 - 1.2 mg/dL Final   Bilirubin, Direct 05/09/2024 0.4 (H)  0.0 - 0.2 mg/dL Final   Indirect Bilirubin 05/09/2024 1.4 (H)  0.3 - 0.9 mg/dL Final   Performed at [facility], [address] [place], [place] [address]   pH, Ven 05/09/2024 7.43  7.25 - 7.43 Final   pCO2, Ven 05/09/2024 32 (L)  44 - 60 mmHg Final   pO2, Ven 05/09/2024 50 (H)  32 - 45 mmHg Final   Bicarbonate 05/09/2024 21.2  20.0 - 28.0 mmol/L Final   Acid-base deficit 05/09/2024 2.3 (H)  0.0 - 2.0 mmol/L Final   O2 Saturation 05/09/2024 80.6  % Final   Patient temperature 05/09/2024 37.0   Final   Performed at [facility], [address], [place], [place] [address]   WBC 05/09/2024 7.9  4.0 - 10.5 K/uL Final   RBC 05/09/2024 4.75  4.22 - 5.81 MIL/uL Final   Hemoglobin 05/09/2024 13.5  13.0 - 17.0 g/dL Final   HCT 93/73/7974 40.5  39.0 - 52.0 % Final   MCV 05/09/2024 85.3  80.0 - 100.0 fL Final   MCH 05/09/2024 28.4  26.0 -  34.0 pg Final   MCHC 05/09/2024 33.3  30.0 - 36.0 g/dL Final   RDW 93/73/7974 14.2  11.5 - 15.5 % Final   Platelets 05/09/2024 260  150 - 400 K/uL Final   nRBC 05/09/2024 0.0  0.0 - 0.2 % Final   Performed at [facility], [address], [place], [place] [address]  Admission on 04/03/2024, Discharged on 04/12/2024  Component Date Value Ref Range Status   Sodium 04/04/2024 135  135 - 145 mmol/L Final   Potassium 04/04/2024 3.8  3.5 - 5.1 mmol/L Final   Chloride 04/04/2024 102  98 - 111 mmol/L Final   CO2 04/04/2024 22  22 - 32 mmol/L Final   Glucose, Bld 04/04/2024 109 (H)  70 - 99 mg/dL Final   Glucose reference range applies only to samples taken after fasting for at least 8 hours.   BUN 04/04/2024 6  6 - 20 mg/dL Final   Creatinine, Ser 04/04/2024 0.74  0.61 - 1.24 mg/dL Final   Calcium  04/04/2024 9.1  8.9 - 10.3 mg/dL Final   GFR, Estimated 04/04/2024 >60  >60 mL/min Final   Comment: (NOTE) Calculated using the CKD-EPI Creatinine Equation (2021)    Anion gap 04/04/2024 11  5 - 15 Final   Performed at [facility], [address] [place], [place] [address]   Cholesterol 04/04/2024 169  0 - 200 mg/dL Final   Triglycerides 94/77/7974 195 (H)  <150 mg/dL Final   HDL 94/77/7974 56  >40 mg/dL Final   Total CHOL/HDL Ratio 04/04/2024 3.0  RATIO Final   VLDL 04/04/2024 39  0 - 40 mg/dL Final   LDL Cholesterol 04/04/2024 74  0 - 99 mg/dL Final   Comment:        Total Cholesterol/HDL:CHD Risk Coronary Heart Disease Risk Table  Men   Women  1/2 Average Risk   3.4   3.3  Average Risk       5.0   4.4  2 X Average Risk   9.6   7.1  3 X Average Risk  23.4   11.0        Use the calculated Patient Ratio above and the CHD Risk Table to determine the patient's CHD Risk.        ATP III CLASSIFICATION (LDL):  <100     mg/dL   Optimal  899-870  mg/dL   Near or Above                    Optimal  130-159  mg/dL   Borderline  839-810  mg/dL    High  >809     mg/dL   Very High Performed at [facility], [address] [place], [place] [address]   Admission on 04/02/2024, Discharged on 04/03/2024  Component Date Value Ref Range Status   Sodium 04/02/2024 137  135 - 145 mmol/L Final   Potassium 04/02/2024 3.3 (L)  3.5 - 5.1 mmol/L Final   Chloride 04/02/2024 102  98 - 111 mmol/L Final   CO2 04/02/2024 23  22 - 32 mmol/L Final   Glucose, Bld 04/02/2024 90  70 - 99 mg/dL Final   Glucose reference range applies only to samples taken after fasting for at least 8 hours.   BUN 04/02/2024 <5 (L)  6 - 20 mg/dL Final   Creatinine, Ser 04/02/2024 0.77  0.61 - 1.24 mg/dL Final   Calcium  04/02/2024 9.6  8.9 - 10.3 mg/dL Final   Total Protein 94/79/7974 8.1  6.5 - 8.1 g/dL Final   Albumin 94/79/7974 3.8  3.5 - 5.0 g/dL Final   AST 94/79/7974 38  15 - 41 U/L Final   ALT 04/02/2024 23  0 - 44 U/L Final   Alkaline Phosphatase 04/02/2024 116  38 - 126 U/L Final   Total Bilirubin 04/02/2024 0.8  0.0 - 1.2 mg/dL Final   GFR, Estimated 04/02/2024 >60  >60 mL/min Final   Comment: (NOTE) Calculated using the CKD-EPI Creatinine Equation (2021)    Anion gap 04/02/2024 12  5 - 15 Final   Performed at [name], [address], [place], [place] [address]   Alcohol, Ethyl (B) 04/02/2024 257 (H)  <15 mg/dL Final   Comment: Please note change in reference range. (NOTE) For medical purposes only. Performed at [facility], [address], [place], [place] [address]    Opiates 04/02/2024 NONE DETECTED  NONE DETECTED Final   Cocaine 04/02/2024 NONE DETECTED  NONE DETECTED Final   Benzodiazepines 04/02/2024 POSITIVE (A)  NONE DETECTED Final   Amphetamines 04/02/2024 NONE DETECTED  NONE DETECTED Final   Tetrahydrocannabinol 04/02/2024 NONE DETECTED  NONE DETECTED Final   Barbiturates 04/02/2024 NONE DETECTED  NONE DETECTED Final   Comment: (NOTE) DRUG SCREEN FOR MEDICAL PURPOSES ONLY.  IF  CONFIRMATION IS NEEDED FOR ANY PURPOSE, NOTIFY LAB WITHIN 5 DAYS.  LOWEST DETECTABLE LIMITS FOR URINE DRUG SCREEN Drug Class                     Cutoff (ng/mL) Amphetamine and metabolites    1000 Barbiturate and metabolites    200 Benzodiazepine                 200 Opiates and metabolites  300 Cocaine and metabolites        300 THC                            50 Performed at [facility], [address] [place], [place] [address]    WBC 04/02/2024 6.3  4.0 - 10.5 K/uL Final   RBC 04/02/2024 4.44  4.22 - 5.81 MIL/uL Final   Hemoglobin 04/02/2024 12.7 (L)  13.0 - 17.0 g/dL Final   HCT 94/79/7974 39.1  39.0 - 52.0 % Final   MCV 04/02/2024 88.1  80.0 - 100.0 fL Final   MCH 04/02/2024 28.6  26.0 - 34.0 pg Final   MCHC 04/02/2024 32.5  30.0 - 36.0 g/dL Final   RDW 94/79/7974 14.5  11.5 - 15.5 % Final   Platelets 04/02/2024 448 (H)  150 - 400 K/uL Final   nRBC 04/02/2024 0.0  0.0 - 0.2 % Final   Neutrophils Relative % 04/02/2024 63  % Final   Neutro Abs 04/02/2024 3.9  1.7 - 7.7 K/uL Final   Lymphocytes Relative 04/02/2024 25  % Final   Lymphs Abs 04/02/2024 1.6  0.7 - 4.0 K/uL Final   Monocytes Relative 04/02/2024 10  % Final   Monocytes Absolute 04/02/2024 0.7  0.1 - 1.0 K/uL Final   Eosinophils Relative 04/02/2024 1  % Final   Eosinophils Absolute 04/02/2024 0.1  0.0 - 0.5 K/uL Final   Basophils Relative 04/02/2024 1  % Final   Basophils Absolute 04/02/2024 0.1  0.0 - 0.1 K/uL Final   Immature Granulocytes 04/02/2024 0  % Final   Abs Immature Granulocytes 04/02/2024 0.02  0.00 - 0.07 K/uL Final   Performed at [facility], [address] [place], [place] [address]  No results displayed because visit has over 200 results.    Admission on 01/25/2024, Discharged on 02/03/2024  Component Date Value Ref Range Status   Folate 01/29/2024 22.8  >5.9 ng/mL Final   Performed at [facility], [address]  [place], [place] [address]   Hgb A1c MFr Bld 01/29/2024 4.5 (L)  4.8 - 5.6 % Final   Comment: (NOTE) Pre diabetes:          5.7%-6.4%  Diabetes:              >6.4%  Glycemic control for   <7.0% adults with diabetes    Mean Plasma Glucose 01/29/2024 82.45  mg/dL Final   Performed at [facility], [address] [place], [place] [address]   Cholesterol 01/29/2024 174  0 - 200 mg/dL Final   Triglycerides 96/82/7974 165 (H)  <150 mg/dL Final   HDL 96/82/7974 50  >40 mg/dL Final   Total CHOL/HDL Ratio 01/29/2024 3.5  RATIO Final   VLDL 01/29/2024 33  0 - 40 mg/dL Final   LDL Cholesterol 01/29/2024 91  0 - 99 mg/dL Final   Comment:        Total Cholesterol/HDL:CHD Risk Coronary Heart Disease Risk Table                     Men   Women  1/2 Average Risk   3.4   3.3  Average Risk       5.0   4.4  2 X Average Risk   9.6   7.1  3 X Average Risk  23.4   11.0  Use the calculated Patient Ratio above and the CHD Risk Table to determine the patient's CHD Risk.        ATP III CLASSIFICATION (LDL):  <100     mg/dL   Optimal  899-870  mg/dL   Near or Above                    Optimal  130-159  mg/dL   Borderline  839-810  mg/dL   High  >809     mg/dL   Very High Performed at [facility], [address], [place], [place] [address]    RPR Ser Ql 01/29/2024 NON REACTIVE  NON REACTIVE Final   Performed at [facility], [address] [place], [place] [address]   TSH 01/29/2024 4.117  0.350 - 4.500 uIU/mL Final   Comment: Performed by a 3rd Generation assay with a functional sensitivity of <=0.01 uIU/mL. Performed at [facility], [address], [place], [place] [address]    Vitamin B-12 01/29/2024 585  180 - 914 pg/mL Final   Comment: (NOTE) This assay is not validated for testing neonatal or myeloproliferative syndrome specimens for Vitamin B12 levels. Performed at [facility], [address] [place], [place]  [address]    Vit D, 25-Hydroxy 01/29/2024 29.42 (L)  30 - 100 ng/mL Final   Comment: (NOTE) Vitamin D  deficiency has been defined by the Institute of Medicine  and an Endocrine Society practice guideline as a level of serum 25-OH  vitamin D  less than 20 ng/mL (1,2). The Endocrine Society went on to  further define vitamin D  insufficiency as a level between 21 and 29  ng/mL (2).  1. IOM (Institute of Medicine). 2010. Dietary reference intakes for  calcium  and D. [place]: [employer]. 2. Holick MF, Binkley Braidwood, Bischoff-Ferrari HA, et al. Evaluation,  treatment, and prevention of vitamin D  deficiency: an Endocrine  Society clinical practice guideline, JCEM. 2011 Jul; 96(7): 1911-30.  Performed at [facility], [address], [place], [place] [address]    Free T4 02/01/2024 0.74  0.61 - 1.12 ng/dL Final   Comment: (NOTE) Biotin ingestion may interfere with free T4 tests. If the results are inconsistent with the TSH level, previous test results, or the clinical presentation, then consider biotin interference. If needed, order repeat testing after stopping biotin. Performed at [facility], [address] [place], [place] [address]   Admission on 01/23/2024, Discharged on 01/25/2024  Component Date Value Ref Range Status   Glucose-Capillary 01/23/2024 111 (H)  70 - 99 mg/dL Final   Glucose reference range applies only to samples taken after fasting for at least 8 hours.   Sodium 01/23/2024 133 (L)  135 - 145 mmol/L Final   Potassium 01/23/2024 4.3  3.5 - 5.1 mmol/L Final   Chloride 01/23/2024 102  98 - 111 mmol/L Final   CO2 01/23/2024 23  22 - 32 mmol/L Final   Glucose, Bld 01/23/2024 100 (H)  70 - 99 mg/dL Final   Glucose reference range applies only to samples taken after fasting for at least 8 hours.   BUN 01/23/2024 8  6 - 20 mg/dL Final   Creatinine, Ser 01/23/2024 0.92  0.61 - 1.24 mg/dL Final   Calcium  01/23/2024 9.4  8.9 - 10.3 mg/dL Final    Total Protein 01/23/2024 7.4  6.5 - 8.1 g/dL Final   Albumin 96/88/7974 3.6  3.5 - 5.0 g/dL  Final   AST 01/23/2024 125 (H)  15 - 41 U/L Final   ALT 01/23/2024 57 (H)  0 - 44 U/L Final   Alkaline Phosphatase 01/23/2024 85  38 - 126 U/L Final   Total Bilirubin 01/23/2024 0.7  0.0 - 1.2 mg/dL Final   GFR, Estimated 01/23/2024 >60  >60 mL/min Final   Comment: (NOTE) Calculated using the CKD-EPI Creatinine Equation (2021)    Anion gap 01/23/2024 8  5 - 15 Final   Performed at [facility], [address] [place], [place] [address]   WBC 01/23/2024 5.2  4.0 - 10.5 K/uL Final   RBC 01/23/2024 3.69 (L)  4.22 - 5.81 MIL/uL Final   Hemoglobin 01/23/2024 11.7 (L)  13.0 - 17.0 g/dL Final   HCT 96/88/7974 35.7 (L)  39.0 - 52.0 % Final   MCV 01/23/2024 96.7  80.0 - 100.0 fL Final   MCH 01/23/2024 31.7  26.0 - 34.0 pg Final   MCHC 01/23/2024 32.8  30.0 - 36.0 g/dL Final   RDW 96/88/7974 13.8  11.5 - 15.5 % Final   Platelets 01/23/2024 291  150 - 400 K/uL Final   nRBC 01/23/2024 0.0  0.0 - 0.2 % Final   Neutrophils Relative % 01/23/2024 65  % Final   Neutro Abs 01/23/2024 3.3  1.7 - 7.7 K/uL Final   Lymphocytes Relative 01/23/2024 16  % Final   Lymphs Abs 01/23/2024 0.8  0.7 - 4.0 K/uL Final   Monocytes Relative 01/23/2024 15  % Final   Monocytes Absolute 01/23/2024 0.8  0.1 - 1.0 K/uL Final   Eosinophils Relative 01/23/2024 3  % Final   Eosinophils Absolute 01/23/2024 0.2  0.0 - 0.5 K/uL Final   Basophils Relative 01/23/2024 1  % Final   Basophils Absolute 01/23/2024 0.1  0.0 - 0.1 K/uL Final   Immature Granulocytes 01/23/2024 0  % Final   Abs Immature Granulocytes 01/23/2024 0.02  0.00 - 0.07 K/uL Final   Performed at [facility], [address] [place], [place] [address]   Magnesium  01/23/2024 2.1  1.7 - 2.4 mg/dL Final   Performed at [facility], [address] [place], [place] [address]   Alcohol, Ethyl (B) 01/23/2024 <10  <10 mg/dL Final   Comment: (NOTE) Lowest  detectable limit for serum alcohol is 10 mg/dL.  For medical purposes only. Performed at [facility], [address], [place], [place] [address]    Opiates 01/23/2024 NONE DETECTED  NONE DETECTED Final   Cocaine 01/23/2024 NONE DETECTED  NONE DETECTED Final   Benzodiazepines 01/23/2024 POSITIVE (A)  NONE DETECTED Final   Amphetamines 01/23/2024 NONE DETECTED  NONE DETECTED Final   Tetrahydrocannabinol 01/23/2024 NONE DETECTED  NONE DETECTED Final   Barbiturates 01/23/2024 NONE DETECTED  NONE DETECTED Final   Comment: (NOTE) DRUG SCREEN FOR MEDICAL PURPOSES ONLY.  IF CONFIRMATION IS NEEDED FOR ANY PURPOSE, NOTIFY LAB WITHIN 5 DAYS.  LOWEST DETECTABLE LIMITS FOR URINE DRUG SCREEN Drug Class                     Cutoff (ng/mL) Amphetamine and metabolites    1000 Barbiturate and metabolites    200 Benzodiazepine                 200 Opiates and metabolites        300 Cocaine and metabolites        300 THC  50 Performed at [facility], [address] [place], [place] [address]    Color, Urine 01/23/2024 YELLOW  YELLOW Final   APPearance 01/23/2024 CLEAR  CLEAR Final   Specific Gravity, Urine 01/23/2024 1.011  1.005 - 1.030 Final   pH 01/23/2024 6.0  5.0 - 8.0 Final   Glucose, UA 01/23/2024 NEGATIVE  NEGATIVE mg/dL Final   Hgb urine dipstick 01/23/2024 NEGATIVE  NEGATIVE Final   Bilirubin Urine 01/23/2024 NEGATIVE  NEGATIVE Final   Ketones, ur 01/23/2024 NEGATIVE  NEGATIVE mg/dL Final   Protein, ur 96/88/7974 NEGATIVE  NEGATIVE mg/dL Final   Nitrite 96/88/7974 NEGATIVE  NEGATIVE Final   Leukocytes,Ua 01/23/2024 NEGATIVE  NEGATIVE Final   Performed at [facility], [address], [place], [place] [address]   Glucose-Capillary 01/23/2024 87  70 - 99 mg/dL Final   Glucose reference range applies only to samples taken after fasting for at least 8 hours.   Sodium 01/23/2024 133 (L)  135 - 145 mmol/L Final   Potassium 01/23/2024 4.3  3.5  - 5.1 mmol/L Final   Chloride 01/23/2024 101  98 - 111 mmol/L Final   BUN 01/23/2024 9  6 - 20 mg/dL Final   Creatinine, Ser 01/23/2024 0.90  0.61 - 1.24 mg/dL Final   Glucose, Bld 96/88/7974 93  70 - 99 mg/dL Final   Glucose reference range applies only to samples taken after fasting for at least 8 hours.   Calcium , Ion 01/23/2024 1.05 (L)  1.15 - 1.40 mmol/L Final   TCO2 01/23/2024 25  22 - 32 mmol/L Final   Hemoglobin 01/23/2024 12.2 (L)  13.0 - 17.0 g/dL Final   HCT 96/88/7974 36.0 (L)  39.0 - 52.0 % Final   HIV Screen 4th Generation wRfx 01/24/2024 Non Reactive  Non Reactive Final   Performed at [facility], [address], [place], [place] [address]   WBC 01/24/2024 4.5  4.0 - 10.5 K/uL Final   RBC 01/24/2024 3.75 (L)  4.22 - 5.81 MIL/uL Final   Hemoglobin 01/24/2024 11.8 (L)  13.0 - 17.0 g/dL Final   HCT 96/87/7974 35.2 (L)  39.0 - 52.0 % Final   MCV 01/24/2024 93.9  80.0 - 100.0 fL Final   MCH 01/24/2024 31.5  26.0 - 34.0 pg Final   MCHC 01/24/2024 33.5  30.0 - 36.0 g/dL Final   RDW 96/87/7974 13.8  11.5 - 15.5 % Final   Platelets 01/24/2024 301  150 - 400 K/uL Final   nRBC 01/24/2024 0.0  0.0 - 0.2 % Final   Performed at [facility], [address], [place], [place] [address]   Sodium 01/24/2024 135  135 - 145 mmol/L Final   Potassium 01/24/2024 3.9  3.5 - 5.1 mmol/L Final   Chloride 01/24/2024 104  98 - 111 mmol/L Final   CO2 01/24/2024 22  22 - 32 mmol/L Final   Glucose, Bld 01/24/2024 96  70 - 99 mg/dL Final   Glucose reference range applies only to samples taken after fasting for at least 8 hours.   BUN 01/24/2024 8  6 - 20 mg/dL Final   Creatinine, Ser 01/24/2024 0.96  0.61 - 1.24 mg/dL Final   Calcium  01/24/2024 9.5  8.9 - 10.3 mg/dL Final   Total Protein 96/87/7974 7.5  6.5 - 8.1 g/dL Final   Albumin 96/87/7974 3.6  3.5 - 5.0 g/dL Final   AST 96/87/7974 86 (H)  15 - 41 U/L Final   ALT 01/24/2024  51 (H)  0 - 44 U/L Final   Alkaline Phosphatase  01/24/2024 81  38 - 126 U/L Final   Total Bilirubin 01/24/2024 0.7  0.0 - 1.2 mg/dL Final   GFR, Estimated 01/24/2024 >60  >60 mL/min Final   Comment: (NOTE) Calculated using the CKD-EPI Creatinine Equation (2021)    Anion gap 01/24/2024 9  5 - 15 Final   Performed at [facility], [address], [place], [place] [address]   WBC 01/25/2024 4.6  4.0 - 10.5 K/uL Final   RBC 01/25/2024 3.72 (L)  4.22 - 5.81 MIL/uL Final   Hemoglobin 01/25/2024 11.7 (L)  13.0 - 17.0 g/dL Final   HCT 96/86/7974 35.1 (L)  39.0 - 52.0 % Final   MCV 01/25/2024 94.4  80.0 - 100.0 fL Final   MCH 01/25/2024 31.5  26.0 - 34.0 pg Final   MCHC 01/25/2024 33.3  30.0 - 36.0 g/dL Final   RDW 96/86/7974 13.8  11.5 - 15.5 % Final   Platelets 01/25/2024 292  150 - 400 K/uL Final   nRBC 01/25/2024 0.0  0.0 - 0.2 % Final   Performed at [facility], [address] [place], [place] [address]   Sodium 01/25/2024 138  135 - 145 mmol/L Final   Potassium 01/25/2024 4.3  3.5 - 5.1 mmol/L Final   Chloride 01/25/2024 104  98 - 111 mmol/L Final   CO2 01/25/2024 24  22 - 32 mmol/L Final   Glucose, Bld 01/25/2024 93  70 - 99 mg/dL Final   Glucose reference range applies only to samples taken after fasting for at least 8 hours.   BUN 01/25/2024 8  6 - 20 mg/dL Final   Creatinine, Ser 01/25/2024 0.79  0.61 - 1.24 mg/dL Final   Calcium  01/25/2024 9.2  8.9 - 10.3 mg/dL Final   GFR, Estimated 01/25/2024 >60  >60 mL/min Final   Comment: (NOTE) Calculated using the CKD-EPI Creatinine Equation (2021)    Anion gap 01/25/2024 10  5 - 15 Final   Performed at [facility], [address], [place], [place] [address]  Admission on 01/18/2024, Discharged on 01/23/2024  Component Date Value Ref Range Status   POC Amphetamine UR 01/18/2024 None Detected  NONE DETECTED (Cut Off Level 1000 ng/mL) Final   POC Secobarbital (BAR) 01/18/2024 None Detected  NONE DETECTED (Cut Off Level 300 ng/mL) Final   POC Buprenorphine (BUP)  01/18/2024 None Detected  NONE DETECTED (Cut Off Level 10 ng/mL) Final   POC Oxazepam (BZO) 01/18/2024 Positive (A)  NONE DETECTED (Cut Off Level 300 ng/mL) Final   POC Cocaine UR 01/18/2024 None Detected  NONE DETECTED (Cut Off Level 300 ng/mL) Final   POC Methamphetamine UR 01/18/2024 None Detected  NONE DETECTED (Cut Off Level 1000 ng/mL) Final   POC Morphine  01/18/2024 None Detected  NONE DETECTED (Cut Off Level 300 ng/mL) Final   POC Methadone UR 01/18/2024 None Detected  NONE DETECTED (Cut Off Level 300 ng/mL) Final   POC Oxycodone  UR 01/18/2024 None Detected  NONE DETECTED (Cut Off Level 100 ng/mL) Final   POC Marijuana UR 01/18/2024 Positive (A)  NONE DETECTED (Cut Off Level 50 ng/mL) Final   Alcohol, Ethyl (B) 01/18/2024 <10  <10 mg/dL Final   Comment: (NOTE) Lowest detectable limit for serum alcohol is 10 mg/dL.  For medical purposes only. Performed at [facility], [address], [place], [place] [address]    Sodium 01/18/2024 138  135 - 145  mmol/L Final   Potassium 01/18/2024 3.7  3.5 - 5.1 mmol/L Final   Chloride 01/18/2024 95 (L)  98 - 111 mmol/L Final   CO2 01/18/2024 26  22 - 32 mmol/L Final   Glucose, Bld 01/18/2024 94  70 - 99 mg/dL Final   Glucose reference range applies only to samples taken after fasting for at least 8 hours.   BUN 01/18/2024 <5 (L)  6 - 20 mg/dL Final   Creatinine, Ser 01/18/2024 0.76  0.61 - 1.24 mg/dL Final   Calcium  01/18/2024 9.2  8.9 - 10.3 mg/dL Final   Total Protein 96/93/7974 7.9  6.5 - 8.1 g/dL Final   Albumin 96/93/7974 3.7  3.5 - 5.0 g/dL Final   AST 96/93/7974 68 (H)  15 - 41 U/L Final   ALT 01/18/2024 23  0 - 44 U/L Final   Alkaline Phosphatase 01/18/2024 111  38 - 126 U/L Final   Total Bilirubin 01/18/2024 1.4 (H)  0.0 - 1.2 mg/dL Final   GFR, Estimated 01/18/2024 >60  >60 mL/min Final   Comment: (NOTE) Calculated using the CKD-EPI Creatinine Equation (2021)    Anion gap 01/18/2024 17 (H)  5 - 15 Final   Performed at  [facility], [address] [place], [place] [address]   Sodium 01/21/2024 134 (L)  135 - 145 mmol/L Final   Potassium 01/21/2024 4.1  3.5 - 5.1 mmol/L Final   Chloride 01/21/2024 99  98 - 111 mmol/L Final   CO2 01/21/2024 23  22 - 32 mmol/L Final   Glucose, Bld 01/21/2024 80  70 - 99 mg/dL Final   Glucose reference range applies only to samples taken after fasting for at least 8 hours.   BUN 01/21/2024 8  6 - 20 mg/dL Final   Creatinine, Ser 01/21/2024 0.69  0.61 - 1.24 mg/dL Final   Calcium  01/21/2024 9.9  8.9 - 10.3 mg/dL Final   Total Protein 96/90/7974 7.8  6.5 - 8.1 g/dL Final   Albumin 96/90/7974 3.8  3.5 - 5.0 g/dL Final   AST 96/90/7974 78 (H)  15 - 41 U/L Final   ALT 01/21/2024 33  0 - 44 U/L Final   Alkaline Phosphatase 01/21/2024 90  38 - 126 U/L Final   Total Bilirubin 01/21/2024 0.7  0.0 - 1.2 mg/dL Final   GFR, Estimated 01/21/2024 >60  >60 mL/min Final   Comment: (NOTE) Calculated using the CKD-EPI Creatinine Equation (2021)    Anion gap 01/21/2024 12  5 - 15 Final   Performed at [facility], [address] [place], [place] [address]   Prothrombin Time 01/21/2024 13.0  11.4 - 15.2 seconds Final   INR 01/21/2024 1.0  0.8 - 1.2 Final   Comment: (NOTE) INR goal varies based on device and disease states. Performed at [facility], [address] [place], [place] [address]    WBC 01/21/2024 5.5  4.0 - 10.5 K/uL Final   RBC 01/21/2024 3.75 (L)  4.22 - 5.81 MIL/uL Final   Hemoglobin 01/21/2024 11.9 (L)  13.0 - 17.0 g/dL Final   HCT 96/90/7974 36.1 (L)  39.0 - 52.0 % Final   MCV 01/21/2024 96.3  80.0 - 100.0 fL Final   MCH 01/21/2024 31.7  26.0 - 34.0 pg Final   MCHC 01/21/2024 33.0  30.0 - 36.0 g/dL Final   RDW 96/90/7974 14.3  11.5 - 15.5 % Final   Platelets 01/21/2024 261  150 - 400 K/uL Final  nRBC 01/21/2024 0.0  0.0 - 0.2 % Final   Performed at [facility], [address], [place], [place] [address]   Magnesium  01/21/2024 1.9  1.7 -  2.4 mg/dL Final   Performed at [facility], [address], [place], [place] [address]  Admission on 01/16/2024, Discharged on 01/17/2024  Component Date Value Ref Range Status   Sodium 01/17/2024 138  135 - 145 mmol/L Final   Potassium 01/17/2024 3.0 (L)  3.5 - 5.1 mmol/L Final   Chloride 01/17/2024 101  98 - 111 mmol/L Final   CO2 01/17/2024 23  22 - 32 mmol/L Final   Glucose, Bld 01/17/2024 102 (H)  70 - 99 mg/dL Final   Glucose reference range applies only to samples taken after fasting for at least 8 hours.   BUN 01/17/2024 5 (L)  6 - 20 mg/dL Final   Creatinine, Ser 01/17/2024 0.57 (L)  0.61 - 1.24 mg/dL Final   Calcium  01/17/2024 8.4 (L)  8.9 - 10.3 mg/dL Final   Total Protein 96/94/7974 7.8  6.5 - 8.1 g/dL Final   Albumin 96/94/7974 3.8  3.5 - 5.0 g/dL Final   AST 96/94/7974 129 (H)  15 - 41 U/L Final   ALT 01/17/2024 27  0 - 44 U/L Final   Alkaline Phosphatase 01/17/2024 108  38 - 126 U/L Final   Total Bilirubin 01/17/2024 0.6  0.0 - 1.2 mg/dL Final   GFR, Estimated 01/17/2024 >60  >60 mL/min Final   Comment: (NOTE) Calculated using the CKD-EPI Creatinine Equation (2021)    Anion gap 01/17/2024 14  5 - 15 Final   Performed at [facility], [address] [place], [place] [address]   WBC 01/17/2024 3.6 (L)  4.0 - 10.5 K/uL Final   RBC 01/17/2024 3.54 (L)  4.22 - 5.81 MIL/uL Final   Hemoglobin 01/17/2024 10.9 (L)  13.0 - 17.0 g/dL Final   HCT 96/94/7974 33.8 (L)  39.0 - 52.0 % Final   MCV 01/17/2024 95.5  80.0 - 100.0 fL Final   MCH 01/17/2024 30.8  26.0 - 34.0 pg Final   MCHC 01/17/2024 32.2  30.0 - 36.0 g/dL Final   RDW 96/94/7974 14.8  11.5 - 15.5 % Final   Platelets 01/17/2024 218  150 - 400 K/uL Final   nRBC 01/17/2024 0.0  0.0 - 0.2 % Final   Neutrophils Relative % 01/17/2024 58  % Final   Neutro Abs 01/17/2024 2.1  1.7 - 7.7 K/uL Final   Lymphocytes Relative 01/17/2024 29  % Final   Lymphs Abs 01/17/2024 1.0  0.7 - 4.0 K/uL Final    Monocytes Relative 01/17/2024 10  % Final   Monocytes Absolute 01/17/2024 0.4  0.1 - 1.0 K/uL Final   Eosinophils Relative 01/17/2024 2  % Final   Eosinophils Absolute 01/17/2024 0.1  0.0 - 0.5 K/uL Final   Basophils Relative 01/17/2024 1  % Final   Basophils Absolute 01/17/2024 0.1  0.0 - 0.1 K/uL Final   Immature Granulocytes 01/17/2024 0  % Final   Abs Immature Granulocytes 01/17/2024 0.01  0.00 - 0.07 K/uL Final   Performed at [facility], [address], [place], [place] [address]   Alcohol, Ethyl (B) 01/17/2024 499 (HH)  <10 mg/dL Final   Comment: CRITICAL RESULT CALLED TO, READ BACK BY AND VERIFIED WITH MAYWEATHER, S. RN AT 0132 ON 3.5.25. FA (NOTE) Lowest detectable limit for serum alcohol is 10 mg/dL.  For medical purposes  only. Performed at [facility], [address], [place], [place] [address]   There may be more visits with results that are not included.    Allergies: Patient has no known allergies.  Medications:  Facility Ordered Medications  Medication   acetaminophen  (TYLENOL ) tablet 650 mg   alum & mag hydroxide-simeth (MAALOX/MYLANTA) 200-200-20 MG/5ML suspension 30 mL   magnesium  hydroxide (MILK OF MAGNESIA) suspension 30 mL   OLANZapine  zydis (ZYPREXA ) disintegrating tablet 5 mg   OLANZapine  (ZYPREXA ) injection 5 mg   OLANZapine  (ZYPREXA ) injection 10 mg   [COMPLETED] thiamine  (VITAMIN B1) injection 100 mg   [START ON 05/29/2024] thiamine  (VITAMIN B1) tablet 100 mg   LORazepam  (ATIVAN ) tablet 1 mg   hydrOXYzine  (ATARAX ) tablet 25 mg   loperamide  (IMODIUM ) capsule 2-4 mg   ondansetron  (ZOFRAN -ODT) disintegrating tablet 4 mg   LORazepam  (ATIVAN ) tablet 1 mg   Followed by   NOREEN ON 05/29/2024] LORazepam  (ATIVAN ) tablet 1 mg   Followed by   NOREEN ON 05/30/2024] LORazepam  (ATIVAN ) tablet 1 mg   Followed by   NOREEN ON 05/31/2024] LORazepam  (ATIVAN ) tablet 1 mg   [COMPLETED] LORazepam  (ATIVAN ) injection 2 mg    DULoxetine  (CYMBALTA ) DR capsule 60 mg   QUEtiapine  (SEROQUEL ) tablet 50 mg   traZODone  (DESYREL ) tablet 50 mg   pantoprazole  (PROTONIX ) EC tablet 40 mg   ferrous sulfate  tablet 325 mg   folic acid  (FOLVITE ) tablet 1 mg   naltrexone  (DEPADE) tablet 50 mg   gabapentin  (NEURONTIN ) capsule 600 mg   [START ON 05/29/2024] multivitamin with minerals tablet 1 tablet   propranolol  (INDERAL ) tablet 10 mg   atorvastatin  (LIPITOR) tablet 40 mg   potassium chloride  SA (KLOR-CON  M) CR tablet 40 mEq   PTA Medications  Medication Sig   ferrous sulfate  325 (65 FE) MG tablet Take 1 tablet (325 mg total) by mouth at bedtime.   hydrOXYzine  (ATARAX ) 25 MG tablet Take 1 tablet (25 mg total) by mouth every 6 (six) hours as needed for anxiety (or CIWA score </= 10).   naltrexone  (DEPADE) 50 MG tablet Take 1 tablet (50 mg total) by mouth daily.   DULoxetine  (CYMBALTA ) 60 MG capsule Take 1 capsule (60 mg total) by mouth daily.   Cholecalciferol  (VITAMIN D -3 PO) Take 1 capsule by mouth daily.   thiamine  (VITAMIN B1) 100 MG tablet Take 100 mg by mouth daily.   atorvastatin  (LIPITOR) 40 MG tablet Take 1 tablet (40 mg total) by mouth daily.   QUEtiapine  (SEROQUEL ) 50 MG tablet Take 1 tablet (50 mg total) by mouth at bedtime.   traZODone  (DESYREL ) 50 MG tablet Take 1 tablet (50 mg total) by mouth at bedtime.   folic acid  (FOLVITE ) 1 MG tablet Take 1 tablet (1 mg total) by mouth daily.   pantoprazole  (PROTONIX ) 40 MG tablet Take 1 tablet (40 mg total) by mouth daily.   Multiple Vitamin (MULTIVITAMIN WITH MINERALS) TABS tablet Take 1 tablet by mouth daily.   gabapentin  (NEURONTIN ) 300 MG capsule Take 600 mg by mouth 3 (three) times daily.   propranolol  (INDERAL ) 10 MG tablet Take 10 mg by mouth 2 (two) times daily.   Long Term Goals: Improvement in symptoms so as ready for discharge  Short Term Goals: Patient will verbalize feelings in meetings with treatment team members., Patient will attend at least of 50% of the  groups daily., Pt will complete the PHQ9 on admission, day 3 and discharge., Patient will participate in completing the [place] Suicide Severity Rating  Scale, Patient will score a low risk of violence for 24 hours prior to discharge, and Patient will take medications as prescribed daily.  Medical Decision Making  -Start Ativan  taper as per the [facility] -Give one time dose of Ativan  2 mg IM for seizure prophylaxis - Restart the following home medications: -Trazodone  50 mg nightly for sleep -Seroquel  50 mg at bedtime for mood stabilization/sleep -Inderal  10 mg twice daily for GAD  - Naltrexone  50 mg daily for alcohol use disorder -Cymbalta  60 mg daily for depressive symptoms/pain -Lipitor 40 mg every evening with dinner for hypercholesterolemia -Ferrous sulfate  325 mg nightly at bedtime -Continue supportive medications: MOM/Tylenol /Maalox/agitation protocol medications as needed as per the MAR-Please see MAR for details.  Labs Reviewed: Potassium is 3.2, given 40 mEq of potassium, we will recheck BMP in the morning.  Sodium is 132.  Urinalysis as last 1 from 07/7 shows rare bacteria. Vitamins & B12 ordered.    Recommendations  -Continue FBC admission  Safety and Monitoring: Voluntary admission to inpatient psychiatric unit for safety, stabilization and treatment Daily contact with patient to assess and evaluate symptoms and progress in treatment Patient's case to be discussed in multi-disciplinary team meeting Observation Level : q15 minute checks Vital signs: q12 hours Precautions: Safety CIWA per protocol  Oral thiamine  and MVI replacement  Discharge Planning: Social work and case management to assist with discharge planning and identification of hospital follow-up needs prior to discharge Estimated LOS: 5-7 days Discharge Concerns: Need to establish a safety plan; Medication compliance and effectiveness Discharge Goals: Abstinence from substances encouraged  SW to look into options  for outpatient SA treatment at discharge  Total Time Spent in Direct Patient Care:  I personally spent 50 minutes on the unit in direct patient care. The direct patient care time included face-to-face time with the patient, reviewing the patient's chart, communicating with other professionals, and coordinating care. Greater than 50% of this time was spent in counseling or coordinating care with the patient regarding goals of hospitalization, psycho-education, and discharge planning needs.    I certify that inpatient services furnished can reasonably be expected to improve the patient's condition.    Donia Snell, NP 7/15/20252:28 PM

## 2024-05-28 NOTE — Group Note (Signed)
 Group Topic: Communication  Group Date: 05/28/2024 Start Time: 1500 End Time: 1515 Facilitators: Carletha Iha, RN  Department: [facility]  Number of Participants: 4  Group Focus: other medication mangement Treatment Modality:  Psychoeducation Interventions utilized were patient education Purpose: increase insight  Name: Xavier Owens Date of Birth: 03-Feb-1971  MR: [id number]    Level of Participation: did not attend Quality of Participation:  Interactions with others:  Mood/Affect:  Triggers (if applicable):  Cognition:  Progress:  Response:  Plan:   Patients Problems:  Patient Active Problem List   Diagnosis Date Noted   Schatzki's ring 05/23/2024   Encounter to discuss test results 05/22/2024   Non-cardiac chest pain 05/22/2024   Esophageal dysphagia 05/22/2024   Abnormal loss of weight 05/22/2024   Abnormal finding on GI tract imaging 05/22/2024   Chest pain 05/21/2024   Elevated troponin 05/21/2024   Hypotension 05/21/2024   Chest discomfort 05/20/2024   Electrolyte abnormality 05/20/2024   Alcohol withdrawal (HCC) 05/09/2024   AKI (acute kidney injury) (HCC) 05/09/2024   Leukocytosis 05/09/2024   Heat exhaustion, water deprivation 05/09/2024   Major depression, recurrent (HCC) 04/03/2024   Reactive depression 03/22/2024   Alcohol withdrawal syndrome with complication (HCC) 03/22/2024   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   Dehydration 03/21/2024   Acute lactic acidosis 03/21/2024   Delirium tremens (HCC) 03/18/2024   Nausea & vomiting 03/16/2024   Alcohol abuse 03/16/2024   Tremor 03/16/2024   Neuropathy 03/16/2024   Mood disorder (HCC) 03/16/2024   Memory impairment 02/08/2024   Right knee pain 02/08/2024   Major depressive disorder, recurrent severe without psychotic features (HCC) 01/25/2024   Alcohol withdrawal seizure (HCC) 01/23/2024   Alcohol use disorder, severe, dependence (HCC) 01/18/2024   Healthcare  maintenance 06/10/2022   Nightmares 12/21/2021   Subclinical hypothyroidism 12/21/2021   Substance induced mood disorder (HCC) 12/14/2021   Alcohol use disorder, moderate, dependence (HCC) 06/23/2021   Seizures (HCC) 06/22/2021   Elevated liver enzymes 04/19/2016   Essential hypertension 04/19/2016

## 2024-05-28 NOTE — ED Notes (Signed)
 Patient vomited twice this evening. No other compliant. He denied other problems except throwing up sporadically.

## 2024-05-28 NOTE — ED Notes (Signed)
 Pt observed lying in bed. Eyes closed respirations even and non labored. NAD q 15 minute observations continue for safety.

## 2024-05-28 NOTE — ED Notes (Signed)
 Mht was doing rounds and seen the pt puking in the bathroom got the RN he went back to his room I went to check on him and he was in the bed said now that he got all that out he feels 100% better. Will keep checking on him.

## 2024-05-28 NOTE — ED Notes (Signed)
 Pt noted to be OOB for meals.   Pt is being encouraged to drink fluids and he is being provided with gatorade and he is drinking that well. Slight tremors noted.  Clammy skin noted.  Although patient denied withdrawal symptoms.   Ativan  taper in place.  Pt is being monitored via CIWA protocol.  Pt last CIWA score 2. Pt denied current SI plan and intent Q 15 minute observations for safety continue

## 2024-05-28 NOTE — ED Notes (Signed)
 Patient admitted [facility] for alcohol detox. On arrival patient A/Ox4, MAE.  Denies SI/HI/AVH. Tolerated the admission process well and answered all questions. Skin assessments WNL. Environment secured per policy. Will monitor for safety.

## 2024-05-28 NOTE — ED Notes (Signed)
 Patient sleeping with eyes closed. NAD, Respirations even and unlabored. Will monitor for safety.

## 2024-05-29 DIAGNOSIS — E785 Hyperlipidemia, unspecified: Secondary | ICD-10-CM | POA: Diagnosis not present

## 2024-05-29 DIAGNOSIS — K219 Gastro-esophageal reflux disease without esophagitis: Secondary | ICD-10-CM | POA: Diagnosis not present

## 2024-05-29 DIAGNOSIS — F331 Major depressive disorder, recurrent, moderate: Secondary | ICD-10-CM | POA: Diagnosis not present

## 2024-05-29 DIAGNOSIS — F1010 Alcohol abuse, uncomplicated: Secondary | ICD-10-CM | POA: Diagnosis not present

## 2024-05-29 MED ORDER — QUETIAPINE FUMARATE 100 MG PO TABS
100.0000 mg | ORAL_TABLET | Freq: Every day | ORAL | Status: DC
  Administered 2024-05-29 – 2024-06-02 (×5): 100 mg via ORAL
  Filled 2024-05-29 (×5): qty 1

## 2024-05-29 MED ORDER — TRAZODONE HCL 100 MG PO TABS
100.0000 mg | ORAL_TABLET | Freq: Every day | ORAL | Status: DC
  Administered 2024-05-29 – 2024-06-02 (×5): 100 mg via ORAL
  Filled 2024-05-29 (×5): qty 1

## 2024-05-29 NOTE — ED Notes (Signed)
 Pt administered prn atarax  for c/o anxiety. Pt currently watching TV in dayroom with other pts.

## 2024-05-29 NOTE — ED Notes (Signed)
 Pt declined to eat dinner, pt seems to be maintaining hydration. Pt currently watching TV in dayroom with other pts. Pt compliant with evening medication lipitor. Pt generally cooperative, no complaints reported at this time.

## 2024-05-29 NOTE — ED Notes (Signed)
 Pt affirms to have had enough breakfast. Pt denies physical discomforts or pain. Pt denies si hi and avh.

## 2024-05-29 NOTE — ED Notes (Signed)
 Pt c/o nausea, prn zofran  administered. Pt reports that he ate about half of lunch.

## 2024-05-29 NOTE — ED Notes (Signed)
 RN discussed all scheduled medications, questions denied. Pt seems to have poor hygiene- significant body odor.

## 2024-05-29 NOTE — Group Note (Signed)
 Group Topic: Overcoming Obstacles  Group Date: 05/29/2024 Start Time: 1930 End Time: 2000 Facilitators: Verdon Jacqualyn BRAVO, NT  Department: [facility]  Number of Participants: 4  Group Focus: coping skills Treatment Modality:  Individual Therapy Interventions utilized were assignment Purpose: trigger / craving management  Name: Xavier Owens Date of Birth: 1971/11/10  MR: [id number]    Level of Participation: active Quality of Participation: cooperative Interactions with others: gave feedback Mood/Affect: appropriate Triggers (if applicable): arguing about nothing, death, cemeteries Cognition: coherent/clear Progress: Moderate Response: n/a Plan: follow-up needed  Patients Problems:  Patient Active Problem List   Diagnosis Date Noted   Schatzki's ring 05/23/2024   Encounter to discuss test results 05/22/2024   Non-cardiac chest pain 05/22/2024   Esophageal dysphagia 05/22/2024   Abnormal loss of weight 05/22/2024   Abnormal finding on GI tract imaging 05/22/2024   Chest pain 05/21/2024   Elevated troponin 05/21/2024   Hypotension 05/21/2024   Chest discomfort 05/20/2024   Electrolyte abnormality 05/20/2024   Alcohol withdrawal (HCC) 05/09/2024   AKI (acute kidney injury) (HCC) 05/09/2024   Leukocytosis 05/09/2024   Heat exhaustion, water deprivation 05/09/2024   Major depression, recurrent (HCC) 04/03/2024   Reactive depression 03/22/2024   Alcohol withdrawal syndrome with complication (HCC) 03/22/2024   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   Dehydration 03/21/2024   Acute lactic acidosis 03/21/2024   Delirium tremens (HCC) 03/18/2024   Nausea & vomiting 03/16/2024   Alcohol abuse 03/16/2024   Tremor 03/16/2024   Neuropathy 03/16/2024   Mood disorder (HCC) 03/16/2024   Memory impairment 02/08/2024   Right knee pain 02/08/2024   Major depressive disorder, recurrent severe without psychotic features (HCC) 01/25/2024   Alcohol  withdrawal seizure (HCC) 01/23/2024   Alcohol use disorder, severe, dependence (HCC) 01/18/2024   Healthcare maintenance 06/10/2022   Nightmares 12/21/2021   Subclinical hypothyroidism 12/21/2021   Substance induced mood disorder (HCC) 12/14/2021   Alcohol use disorder, moderate, dependence (HCC) 06/23/2021   Seizures (HCC) 06/22/2021   Elevated liver enzymes 04/19/2016   Essential hypertension 04/19/2016

## 2024-05-29 NOTE — ED Notes (Signed)
 Pt is in the bedroom calm and sleeping. NAD Respirations are even and unlabored. Environment secured per policy.  Will continue to monitor for safety.

## 2024-05-29 NOTE — Group Note (Signed)
 Group Topic: Emotional Regulation  Group Date: 05/29/2024 Start Time: 1710 End Time: 1740 Facilitators: Daved Tinnie HERO, RN  Department: [facility]  Number of Participants: 4  Group Focus: anger management and self-awareness Treatment Modality:  Psychoeducation Interventions utilized were patient education Purpose: express feelings  Name: Xavier Owens Date of Birth: 05-Mar-1971  MR: [id number]    Level of Participation: active Quality of Participation: attentive and cooperative Interactions with others: gave feedback Mood/Affect: appropriate Triggers (if applicable): n/a Cognition: coherent/clear Progress: Gaining insight Response: pt says he will maintain emotional awareness by increasing mindfulness and being intentional Plan: patient will be encouraged to attend future RN education groups  Patients Problems:  Patient Active Problem List   Diagnosis Date Noted   Schatzki's ring 05/23/2024   Encounter to discuss test results 05/22/2024   Non-cardiac chest pain 05/22/2024   Esophageal dysphagia 05/22/2024   Abnormal loss of weight 05/22/2024   Abnormal finding on GI tract imaging 05/22/2024   Chest pain 05/21/2024   Elevated troponin 05/21/2024   Hypotension 05/21/2024   Chest discomfort 05/20/2024   Electrolyte abnormality 05/20/2024   Alcohol withdrawal (HCC) 05/09/2024   AKI (acute kidney injury) (HCC) 05/09/2024   Leukocytosis 05/09/2024   Heat exhaustion, water deprivation 05/09/2024   Major depression, recurrent (HCC) 04/03/2024   Reactive depression 03/22/2024   Alcohol withdrawal syndrome with complication (HCC) 03/22/2024   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   Dehydration 03/21/2024   Acute lactic acidosis 03/21/2024   Delirium tremens (HCC) 03/18/2024   Nausea & vomiting 03/16/2024   Alcohol abuse 03/16/2024   Tremor 03/16/2024   Neuropathy 03/16/2024   Mood disorder (HCC) 03/16/2024   Memory impairment 02/08/2024    Right knee pain 02/08/2024   Major depressive disorder, recurrent severe without psychotic features (HCC) 01/25/2024   Alcohol withdrawal seizure (HCC) 01/23/2024   Alcohol use disorder, severe, dependence (HCC) 01/18/2024   Healthcare maintenance 06/10/2022   Nightmares 12/21/2021   Subclinical hypothyroidism 12/21/2021   Substance induced mood disorder (HCC) 12/14/2021   Alcohol use disorder, moderate, dependence (HCC) 06/23/2021   Seizures (HCC) 06/22/2021   Elevated liver enzymes 04/19/2016   Essential hypertension 04/19/2016

## 2024-05-29 NOTE — ED Provider Notes (Addendum)
 Behavioral Health Progress Note  Date and Time: 05/29/2024 3:00 PM Name: Xavier Owens MRN:  [id number]  HPI: Xavier Owens is a 53 y.o. male who [facility] [place] [facility] with worsening depressive symptoms & SI as well as alcohol withdrawals symptoms. Per triage:  Pt presents to [facility] unaccompanied. Pt states he is having withdrawls from alcohol starting this morning. Pt reports that he drank yesterday and all weekend. Pt mentions he had a fifth of alcohol yesterday. Pt mentions he is drinking every weekend.  Patient assessment note: On assessment today, the pt reports that their mood is still depressed, but improving. Reports that anxiety is also improving. Sleep was poor last night due to not having his right dose of Seroquel  and Trazodone . Shares that he takes 50 mg of each nightly at home. Educated that we will order this for him tonight. Appetite is fair. Concentration is fair.  Energy level is low as per patient due to poor sleep.  Denies suicidal thoughts. Denies suicidal intent and plan.  Denies having any HI.  Denies having psychotic symptoms.   Denies having side effects to current psychiatric medications.   We discussed changes to current medication regimen, including increasing doses of Seroquel  and Trazodone  to 100 mg each from 50 mg each.  Discussed the following psychosocial stressors: Recurrent substance use with inability to maintain sobriety and pt states that he is hoping to go to rehab this time in an effort to regain his sobriety. Patient is tolerating Ativan  detox protocol well, denies any side effects to medications, denies withdrawal symptoms currently, reports that he had nausea last night, but shares that it has resolved.  Diagnosis:  Final diagnoses:  Hyperlipidemia, unspecified hyperlipidemia type  Gastroesophageal reflux disease without esophagitis  Alcohol use disorder  Moderate episode of recurrent major depressive  disorder (HCC)  History of seizure due to alcohol withdrawal   Total Time spent with patient: 45 minutes  Additional Social History:    Pain Medications: See MAR Prescriptions: See MAR Over the Counter: See MAR History of alcohol / drug use?: Yes Longest period of sobriety (when/how long): Unsure. Negative Consequences of Use:  (Unsure.) Withdrawal Symptoms: Seizures, Irritability, Nausea / Vomiting Onset of Seizures: Unknown Date of most recent seizure: sometime this year, per his report    Sleep: Poor  Appetite:  Fair  Current Medications:  Current Facility-Administered Medications  Medication Dose Route Frequency Provider Last Rate Last Admin   acetaminophen  (TYLENOL ) tablet 650 mg  650 mg Oral Q6H PRN Trudy Carwin, NP       alum & mag hydroxide-simeth (MAALOX/MYLANTA) 200-200-20 MG/5ML suspension 30 mL  30 mL Oral Q4H PRN Trudy Carwin, NP       atorvastatin  (LIPITOR) tablet 40 mg  40 mg Oral QPM Lashina Milles, NP   40 mg at 05/28/24 1720   DULoxetine  (CYMBALTA ) DR capsule 60 mg  60 mg Oral Daily Melonie Germani, Donia, NP   60 mg at 05/29/24 1017   ferrous sulfate  tablet 325 mg  325 mg Oral QHS Aspyn Warnke, NP   325 mg at 05/28/24 2142   folic acid  (FOLVITE ) tablet 1 mg  1 mg Oral Daily Rosanne Wohlfarth, NP   1 mg at 05/29/24 1018   gabapentin  (NEURONTIN ) capsule 600 mg  600 mg Oral TID Aurianna Earlywine, NP   600 mg at 05/29/24 1018   hydrOXYzine  (ATARAX ) tablet 25 mg  25 mg Oral Q6H PRN Cole Kandi BROCKS, MD  loperamide  (IMODIUM ) capsule 2-4 mg  2-4 mg Oral PRN Bethea, Terrence C, MD       LORazepam  (ATIVAN ) tablet 1 mg  1 mg Oral Q6H PRN Cole Kandi BROCKS, MD       LORazepam  (ATIVAN ) tablet 1 mg  1 mg Oral TID Bethea, Terrence C, MD   1 mg at 05/29/24 1018   Followed by   NOREEN ON 05/30/2024] LORazepam  (ATIVAN ) tablet 1 mg  1 mg Oral BID Cole Kandi BROCKS, MD       Followed by   NOREEN ON 05/31/2024] LORazepam  (ATIVAN ) tablet 1 mg  1 mg Oral Daily Bethea, Terrence C,  MD       magnesium  hydroxide (MILK OF MAGNESIA) suspension 30 mL  30 mL Oral Daily PRN Trudy Carwin, NP       multivitamin with minerals tablet 1 tablet  1 tablet Oral Daily Tex Drilling, NP   1 tablet at 05/29/24 1019   naltrexone  (DEPADE) tablet 50 mg  50 mg Oral Daily Kamron Vanwyhe, NP   50 mg at 05/29/24 1020   OLANZapine  (ZYPREXA ) injection 10 mg  10 mg Intramuscular TID PRN Trudy Carwin, NP       OLANZapine  (ZYPREXA ) injection 5 mg  5 mg Intramuscular TID PRN Trudy Carwin, NP       OLANZapine  zydis (ZYPREXA ) disintegrating tablet 5 mg  5 mg Oral TID PRN Trudy Carwin, NP       ondansetron  (ZOFRAN -ODT) disintegrating tablet 4 mg  4 mg Oral Q6H PRN Bethea, Terrence C, MD   4 mg at 05/28/24 2145   pantoprazole  (PROTONIX ) EC tablet 40 mg  40 mg Oral Daily Jerry Haugen, Drilling, NP   40 mg at 05/29/24 1020   propranolol  (INDERAL ) tablet 10 mg  10 mg Oral BID Tex Drilling, NP   10 mg at 05/29/24 1020   QUEtiapine  (SEROQUEL ) tablet 50 mg  50 mg Oral QHS Tex Drilling, NP   50 mg at 05/28/24 2142   thiamine  (VITAMIN B1) tablet 100 mg  100 mg Oral Daily Cole Kandi C, MD   100 mg at 05/29/24 1020   traZODone  (DESYREL ) tablet 50 mg  50 mg Oral QHS Tex Drilling, NP   50 mg at 05/28/24 2142   Current Outpatient Medications  Medication Sig Dispense Refill   atorvastatin  (LIPITOR) 40 MG tablet Take 1 tablet (40 mg total) by mouth daily. 30 tablet 0   Cholecalciferol  (VITAMIN D -3 PO) Take 1 capsule by mouth daily.     DULoxetine  (CYMBALTA ) 60 MG capsule Take 1 capsule (60 mg total) by mouth daily. 30 capsule 3   ferrous sulfate  325 (65 FE) MG tablet Take 1 tablet (325 mg total) by mouth at bedtime. 30 tablet 0   folic acid  (FOLVITE ) 1 MG tablet Take 1 tablet (1 mg total) by mouth daily. 30 tablet 0   gabapentin  (NEURONTIN ) 300 MG capsule Take 600 mg by mouth 3 (three) times daily.     hydrOXYzine  (ATARAX ) 25 MG tablet Take 1 tablet (25 mg total) by mouth every 6 (six) hours as needed for  anxiety (or CIWA score </= 10). 90 tablet 3   Multiple Vitamin (MULTIVITAMIN WITH MINERALS) TABS tablet Take 1 tablet by mouth daily.     naltrexone  (DEPADE) 50 MG tablet Take 1 tablet (50 mg total) by mouth daily. 30 tablet 3   pantoprazole  (PROTONIX ) 40 MG tablet Take 1 tablet (40 mg total) by mouth daily. 30 tablet 2   propranolol  (INDERAL ) 10 MG  tablet Take 10 mg by mouth 2 (two) times daily.     QUEtiapine  (SEROQUEL ) 50 MG tablet Take 1 tablet (50 mg total) by mouth at bedtime. 30 tablet 0   thiamine  (VITAMIN B1) 100 MG tablet Take 100 mg by mouth daily.     traZODone  (DESYREL ) 50 MG tablet Take 1 tablet (50 mg total) by mouth at bedtime. 30 tablet 0    Labs  Lab Results:  Admission on 05/27/2024  Component Date Value Ref Range Status   WBC 05/27/2024 8.2  4.0 - 10.5 K/uL Final   RBC 05/27/2024 4.86  4.22 - 5.81 MIL/uL Final   Hemoglobin 05/27/2024 13.9  13.0 - 17.0 g/dL Final   HCT 92/85/7974 41.7  39.0 - 52.0 % Final   MCV 05/27/2024 85.8  80.0 - 100.0 fL Final   MCH 05/27/2024 28.6  26.0 - 34.0 pg Final   MCHC 05/27/2024 33.3  30.0 - 36.0 g/dL Final   RDW 92/85/7974 14.3  11.5 - 15.5 % Final   Platelets 05/27/2024 346  150 - 400 K/uL Final   nRBC 05/27/2024 0.0  0.0 - 0.2 % Final   Neutrophils Relative % 05/27/2024 64  % Final   Neutro Abs 05/27/2024 5.3  1.7 - 7.7 K/uL Final   Lymphocytes Relative 05/27/2024 18  % Final   Lymphs Abs 05/27/2024 1.5  0.7 - 4.0 K/uL Final   Monocytes Relative 05/27/2024 16  % Final   Monocytes Absolute 05/27/2024 1.3 (H)  0.1 - 1.0 K/uL Final   Eosinophils Relative 05/27/2024 1  % Final   Eosinophils Absolute 05/27/2024 0.1  0.0 - 0.5 K/uL Final   Basophils Relative 05/27/2024 1  % Final   Basophils Absolute 05/27/2024 0.1  0.0 - 0.1 K/uL Final   Immature Granulocytes 05/27/2024 0  % Final   Abs Immature Granulocytes 05/27/2024 0.03  0.00 - 0.07 K/uL Final   Performed at [facility], [address], [place], [place] [address]    Sodium 05/27/2024 132 (L)  135 - 145 mmol/L Final   Potassium 05/27/2024 3.2 (L)  3.5 - 5.1 mmol/L Final   Chloride 05/27/2024 94 (L)  98 - 111 mmol/L Final   CO2 05/27/2024 21 (L)  22 - 32 mmol/L Final   Glucose, Bld 05/27/2024 97  70 - 99 mg/dL Final   Glucose reference range applies only to samples taken after fasting for at least 8 hours.   BUN 05/27/2024 7  6 - 20 mg/dL Final   Creatinine, Ser 05/27/2024 0.93  0.61 - 1.24 mg/dL Final   Calcium  05/27/2024 10.0  8.9 - 10.3 mg/dL Final   Total Protein 92/85/7974 7.9  6.5 - 8.1 g/dL Final   Albumin 92/85/7974 4.2  3.5 - 5.0 g/dL Final   AST 92/85/7974 47 (H)  15 - 41 U/L Final   ALT 05/27/2024 24  0 - 44 U/L Final   Alkaline Phosphatase 05/27/2024 120  38 - 126 U/L Final   Total Bilirubin 05/27/2024 0.9  0.0 - 1.2 mg/dL Final   GFR, Estimated 05/27/2024 >60  >60 mL/min Final   Comment: (NOTE) Calculated using the CKD-EPI Creatinine Equation (2021)    Anion gap 05/27/2024 17 (H)  5 - 15 Final   Performed at [facility], [address], [place], [place] [address]   Alcohol, Ethyl (B) 05/27/2024 <15  <15 mg/dL Final   Comment: (NOTE) For medical purposes only. Performed at [facility], [address],  [place], [place] [address]    TSH 05/27/2024 3.436  0.350 - 4.500 uIU/mL Final   Comment: Performed by a 3rd Generation assay with a functional sensitivity of <=0.01 uIU/mL. Performed at [facility] Lab, [address] [place], [place] [address]   Admission on 05/20/2024, Discharged on 05/24/2024  Component Date Value Ref Range Status   WBC 05/20/2024 15.9 (H)  4.0 - 10.5 K/uL Final   RBC 05/20/2024 4.91  4.22 - 5.81 MIL/uL Final   Hemoglobin 05/20/2024 13.8  13.0 - 17.0 g/dL Final   HCT 92/92/7974 42.8  39.0 - 52.0 % Final   MCV 05/20/2024 87.2  80.0 - 100.0 fL Final   MCH 05/20/2024 28.1  26.0 - 34.0 pg Final   MCHC 05/20/2024 32.2  30.0 - 36.0 g/dL Final   RDW 92/92/7974 14.2  11.5 - 15.5 % Final   Platelets  05/20/2024 353  150 - 400 K/uL Final   nRBC 05/20/2024 0.0  0.0 - 0.2 % Final   Performed at [facility], [address], [place], [place] [address]   Sodium 05/20/2024 135  135 - 145 mmol/L Final   Potassium 05/20/2024 3.3 (L)  3.5 - 5.1 mmol/L Final   Chloride 05/20/2024 99  98 - 111 mmol/L Final   CO2 05/20/2024 11 (L)  22 - 32 mmol/L Final   Glucose, Bld 05/20/2024 87  70 - 99 mg/dL Final   Glucose reference range applies only to samples taken after fasting for at least 8 hours.   BUN 05/20/2024 5 (L)  6 - 20 mg/dL Final   Creatinine, Ser 05/20/2024 1.16  0.61 - 1.24 mg/dL Final   Calcium  05/20/2024 9.5  8.9 - 10.3 mg/dL Final   Total Protein 92/92/7974 7.9  6.5 - 8.1 g/dL Final   Albumin 92/92/7974 3.8  3.5 - 5.0 g/dL Final   AST 92/92/7974 52 (H)  15 - 41 U/L Final   ALT 05/20/2024 21  0 - 44 U/L Final   Alkaline Phosphatase 05/20/2024 97  38 - 126 U/L Final   Total Bilirubin 05/20/2024 2.3 (H)  0.0 - 1.2 mg/dL Final   GFR, Estimated 05/20/2024 >60  >60 mL/min Final   Comment: (NOTE) Calculated using the CKD-EPI Creatinine Equation (2021)    Anion gap 05/20/2024 25 (H)  5 - 15 Corrected   Comment: REPEATED TO VERIFY Performed at [facility], [address], [place], [place] [address] CORRECTED ON 07/07 AT 1105: PREVIOUSLY REPORTED AS 25 CRITICAL RESULT CALLED TO, READ BACK BY AND VERIFIED WITH DEVOLT,TRAVIS RN 1047 05/20/24 AMIREHSNAIF REPEATED TO VERIFY    Lipase 05/20/2024 32  11 - 51 U/L Final   Performed at [facility], [address], [place], [place] [address]   Troponin I (High Sensitivity) 05/20/2024 131 (HH)  <18 ng/L Final   Comment: CRITICAL RESULT CALLED TO, READ BACK BY AND VERIFIED WITH DEVOLT,TRAVIS RN 1047 05/20/24 AMIREHSNAIF (NOTE) Elevated high sensitivity troponin I (hsTnI) values and significant  changes across serial measurements may suggest ACS but many other  chronic and acute conditions are known to elevate hsTnI results.  Refer to  the Links section for chest pain algorithms and additional  guidance. Performed at [facility], [address], [place], [place] [address]    Troponin I (High Sensitivity) 05/20/2024 76 (H)  <18 ng/L Final   Comment: (NOTE) Elevated high sensitivity troponin I (hsTnI) values and significant  changes across serial measurements may suggest ACS  but many other  chronic and acute conditions are known to elevate hsTnI results.  Refer to the Links section for chest pain algorithms and additional  guidance. Performed at [facility], [address], [place], [place] [address]    pH, Ven 05/20/2024 7.443 (H)  7.25 - 7.43 Final   pCO2, Ven 05/20/2024 22.4 (L)  44 - 60 mmHg Final   pO2, Ven 05/20/2024 71 (H)  32 - 45 mmHg Final   Bicarbonate 05/20/2024 15.3 (L)  20.0 - 28.0 mmol/L Final   TCO2 05/20/2024 16 (L)  22 - 32 mmol/L Final   O2 Saturation 05/20/2024 95  % Final   Acid-base deficit 05/20/2024 6.0 (H)  0.0 - 2.0 mmol/L Final   Sodium 05/20/2024 128 (L)  135 - 145 mmol/L Final   Potassium 05/20/2024 5.8 (H)  3.5 - 5.1 mmol/L Final   Calcium , Ion 05/20/2024 0.94 (L)  1.15 - 1.40 mmol/L Final   HCT 05/20/2024 48.0  39.0 - 52.0 % Final   Hemoglobin 05/20/2024 16.3  13.0 - 17.0 g/dL Final   Sample type 92/92/7974 VENOUS   Final   Color, Urine 05/20/2024 AMBER (A)  YELLOW Final   BIOCHEMICALS MAY BE AFFECTED BY COLOR   APPearance 05/20/2024 HAZY (A)  CLEAR Final   Specific Gravity, Urine 05/20/2024 1.026  1.005 - 1.030 Final   pH 05/20/2024 5.0  5.0 - 8.0 Final   Glucose, UA 05/20/2024 NEGATIVE  NEGATIVE mg/dL Final   Hgb urine dipstick 05/20/2024 SMALL (A)  NEGATIVE Final   Bilirubin Urine 05/20/2024 NEGATIVE  NEGATIVE Final   Ketones, ur 05/20/2024 80 (A)  NEGATIVE mg/dL Final   Protein, ur 92/92/7974 100 (A)  NEGATIVE mg/dL Final   Nitrite 92/92/7974 NEGATIVE  NEGATIVE Final   Leukocytes,Ua 05/20/2024 NEGATIVE  NEGATIVE Final   RBC / HPF 05/20/2024 0-5  0 - 5 RBC/hpf  Final   WBC, UA 05/20/2024 0-5  0 - 5 WBC/hpf Final   Bacteria, UA 05/20/2024 RARE (A)  NONE SEEN Final   Squamous Epithelial / HPF 05/20/2024 0-5  0 - 5 /HPF Final   Mucus 05/20/2024 PRESENT   Final   Hyaline Casts, UA 05/20/2024 PRESENT   Final   Performed at [facility], [address] [place], [place] [address]   Osmolality 05/20/2024 279  275 - 295 mOsm/kg Final   Comment: REPEATED TO VERIFY Performed at [facility], [address], [place], [place] [address]    Weight 05/20/2024 2,528  oz Final   Height 05/20/2024 68  in Final   BP 05/20/2024 122/71  mmHg Final   S' Lateral 05/20/2024 2.50  cm Final   Area-P 1/2 05/20/2024 4.96  cm2 Final   Est EF 05/20/2024 60 - 65%   Final   Cholesterol 05/20/2024 208 (H)  0 - 200 mg/dL Final   Triglycerides 92/92/7974 168 (H)  <150 mg/dL Final   HDL 92/92/7974 79  >40 mg/dL Final   Total CHOL/HDL Ratio 05/20/2024 2.6  RATIO Final   VLDL 05/20/2024 34  0 - 40 mg/dL Final   LDL Cholesterol 05/20/2024 95  0 - 99 mg/dL Final   Comment:        Total Cholesterol/HDL:CHD Risk Coronary Heart Disease Risk Table                     Men   Women  1/2 Average Risk   3.4   3.3  Average Risk  5.0   4.4  2 X Average Risk   9.6   7.1  3 X Average Risk  23.4   11.0        Use the calculated Patient Ratio above and the CHD Risk Table to determine the patient's CHD Risk.        ATP III CLASSIFICATION (LDL):  <100     mg/dL   Optimal  899-870  mg/dL   Near or Above                    Optimal  130-159  mg/dL   Borderline  839-810  mg/dL   High  >809     mg/dL   Very High Performed at [facility], [address] [place], [place] [address]    Hgb A1c MFr Bld 05/20/2024 4.4 (L)  4.8 - 5.6 % Final   Comment: (NOTE) Diagnosis of Diabetes The following HbA1c ranges recommended by the American Diabetes Association (ADA) may be used as an aid in the diagnosis of diabetes mellitus.  Hemoglobin             Suggested A1C NGSP%               Diagnosis  <5.7                   Non Diabetic  5.7-6.4                Pre-Diabetic  >6.4                   Diabetic  <7.0                   Glycemic control for                       adults with diabetes.     Mean Plasma Glucose 05/20/2024 79.58  mg/dL Final   Performed at [facility], [address] [place], [place] [address]   CRP 05/20/2024 3.3 (H)  <1.0 mg/dL Final   Performed at [facility], [address], [place], [place] [address]   Sed Rate 05/20/2024 4  0 - 16 mm/hr Final   Performed at [facility], [address], [place], [place] [address]   Alcohol, Ethyl (B) 05/20/2024 <15  <15 mg/dL Final   Comment: (NOTE) For medical purposes only. Performed at [facility], [address] [place], [place] [address]    Opiates 05/21/2024 NONE DETECTED  NONE DETECTED Final   Cocaine 05/21/2024 NONE DETECTED  NONE DETECTED Final   Benzodiazepines 05/21/2024 NONE DETECTED  NONE DETECTED Final   Amphetamines 05/21/2024 NONE DETECTED  NONE DETECTED Final   Tetrahydrocannabinol 05/21/2024 NONE DETECTED  NONE DETECTED Final   Barbiturates 05/21/2024 NONE DETECTED  NONE DETECTED Final   Comment: (NOTE) DRUG SCREEN FOR MEDICAL PURPOSES ONLY.  IF CONFIRMATION IS NEEDED FOR ANY PURPOSE, NOTIFY LAB WITHIN 5 DAYS.  LOWEST DETECTABLE LIMITS FOR URINE DRUG SCREEN Drug Class                     Cutoff (ng/mL) Amphetamine and metabolites    1000 Barbiturate and metabolites    200 Benzodiazepine                 200 Opiates and metabolites        300 Cocaine and metabolites  300 THC                            50 Performed at [facility], [address] [place], [place] [address]    Sodium 05/21/2024 134 (L)  135 - 145 mmol/L Final   Potassium 05/21/2024 3.1 (L)  3.5 - 5.1 mmol/L Final   Chloride 05/21/2024 103  98 - 111 mmol/L Final   CO2 05/21/2024 23  22 - 32 mmol/L Final   Glucose, Bld 05/21/2024 116 (H)  70 - 99 mg/dL Final    Glucose reference range applies only to samples taken after fasting for at least 8 hours.   BUN 05/21/2024 7  6 - 20 mg/dL Final   Creatinine, Ser 05/21/2024 0.91  0.61 - 1.24 mg/dL Final   Calcium  05/21/2024 9.0  8.9 - 10.3 mg/dL Final   GFR, Estimated 05/21/2024 >60  >60 mL/min Final   Comment: (NOTE) Calculated using the CKD-EPI Creatinine Equation (2021)    Anion gap 05/21/2024 8  5 - 15 Final   Performed at [facility], [address] [place], [place] [address]   Lactic Acid, Venous 05/20/2024 2.4 (HH)  0.5 - 1.9 mmol/L Final   Comment: CRITICAL RESULT CALLED TO, READ BACK BY AND VERIFIED WITH FABIENE BOWLER, RN AT 2128 07.07.25 JLASIGAN Performed at [facility], [address], [place], [place] [address]    WBC 05/21/2024 6.2  4.0 - 10.5 K/uL Final   RBC 05/21/2024 4.54  4.22 - 5.81 MIL/uL Final   Hemoglobin 05/21/2024 12.9 (L)  13.0 - 17.0 g/dL Final   HCT 92/91/7974 37.9 (L)  39.0 - 52.0 % Final   MCV 05/21/2024 83.5  80.0 - 100.0 fL Final   MCH 05/21/2024 28.4  26.0 - 34.0 pg Final   MCHC 05/21/2024 34.0  30.0 - 36.0 g/dL Final   RDW 92/91/7974 14.5  11.5 - 15.5 % Final   Platelets 05/21/2024 270  150 - 400 K/uL Final   nRBC 05/21/2024 0.0  0.0 - 0.2 % Final   Performed at [facility], [address], [place], [place] [address]   Lactic Acid, Venous 05/20/2024 2.4 (HH)  0.5 - 1.9 mmol/L Final   Comment: CRITICAL VALUE NOTED. VALUE IS CONSISTENT WITH PREVIOUSLY REPORTED/CALLED VALUE Performed at [facility], [address], [place], [place] [address]    Lactic Acid, Venous 05/21/2024 1.6  0.5 - 1.9 mmol/L Final   Performed at [facility], [address], [place], [place] [address]   Magnesium  05/21/2024 1.7  1.7 - 2.4 mg/dL Final   Performed at [facility], [address], [place], [place] [address]   WBC 05/22/2024 6.1  4.0 - 10.5 K/uL Final   RBC 05/22/2024 3.83 (L)  4.22 - 5.81 MIL/uL Final   Hemoglobin 05/22/2024 11.0 (L)  13.0  - 17.0 g/dL Final   HCT 92/90/7974 33.0 (L)  39.0 - 52.0 % Final   MCV 05/22/2024 86.2  80.0 - 100.0 fL Final   MCH 05/22/2024 28.7  26.0 - 34.0 pg Final   MCHC 05/22/2024 33.3  30.0 - 36.0 g/dL Final   RDW 92/90/7974 14.4  11.5 - 15.5 % Final   Platelets 05/22/2024 219  150 - 400 K/uL Final   nRBC 05/22/2024 0.0  0.0 - 0.2 % Final   Performed at [facility] Lab, [address] [place],  [place] [address]   Sodium 05/22/2024 137  135 - 145 mmol/L Final   Potassium 05/22/2024 3.6  3.5 - 5.1 mmol/L Final   Chloride 05/22/2024 107  98 - 111 mmol/L Final   CO2 05/22/2024 24  22 - 32 mmol/L Final   Glucose, Bld 05/22/2024 100 (H)  70 - 99 mg/dL Final   Glucose reference range applies only to samples taken after fasting for at least 8 hours.   BUN 05/22/2024 5 (L)  6 - 20 mg/dL Final   Creatinine, Ser 05/22/2024 0.75  0.61 - 1.24 mg/dL Final   Calcium  05/22/2024 8.1 (L)  8.9 - 10.3 mg/dL Final   GFR, Estimated 05/22/2024 >60  >60 mL/min Final   Comment: (NOTE) Calculated using the CKD-EPI Creatinine Equation (2021)    Anion gap 05/22/2024 6  5 - 15 Final   Performed at [facility] Lab, [address], [place], [place] [address]   WBC 05/23/2024 6.3  4.0 - 10.5 K/uL Final   RBC 05/23/2024 4.58  4.22 - 5.81 MIL/uL Final   Hemoglobin 05/23/2024 12.8 (L)  13.0 - 17.0 g/dL Final   HCT 92/89/7974 39.5  39.0 - 52.0 % Final   MCV 05/23/2024 86.2  80.0 - 100.0 fL Final   MCH 05/23/2024 27.9  26.0 - 34.0 pg Final   MCHC 05/23/2024 32.4  30.0 - 36.0 g/dL Final   RDW 92/89/7974 14.6  11.5 - 15.5 % Final   Platelets 05/23/2024 252  150 - 400 K/uL Final   nRBC 05/23/2024 0.0  0.0 - 0.2 % Final   Performed at [facility] Lab, [address], [place], [place] [address]   Sodium 05/23/2024 139  135 - 145 mmol/L Final   Potassium 05/23/2024 3.4 (L)  3.5 - 5.1 mmol/L Final   Chloride 05/23/2024 102  98 - 111 mmol/L Final   CO2 05/23/2024 22  22 - 32 mmol/L Final   Glucose, Bld 05/23/2024 92   70 - 99 mg/dL Final   Glucose reference range applies only to samples taken after fasting for at least 8 hours.   BUN 05/23/2024 <5 (L)  6 - 20 mg/dL Final   Creatinine, Ser 05/23/2024 0.64  0.61 - 1.24 mg/dL Final   Calcium  05/23/2024 9.7  8.9 - 10.3 mg/dL Final   GFR, Estimated 05/23/2024 >60  >60 mL/min Final   Comment: (NOTE) Calculated using the CKD-EPI Creatinine Equation (2021)    Anion gap 05/23/2024 15  5 - 15 Final   Performed at [facility], [address], [place], [place] [address]   Magnesium  05/23/2024 1.6 (L)  1.7 - 2.4 mg/dL Final   Performed at [facility], [address] [place], [place] [address]   WBC 05/24/2024 5.1  4.0 - 10.5 K/uL Final   RBC 05/24/2024 4.36  4.22 - 5.81 MIL/uL Final   Hemoglobin 05/24/2024 12.6 (L)  13.0 - 17.0 g/dL Final   HCT 92/88/7974 38.0 (L)  39.0 - 52.0 % Final   MCV 05/24/2024 87.2  80.0 - 100.0 fL Final   MCH 05/24/2024 28.9  26.0 - 34.0 pg Final   MCHC 05/24/2024 33.2  30.0 - 36.0 g/dL Final   RDW 92/88/7974 14.7  11.5 - 15.5 % Final   Platelets 05/24/2024 237  150 - 400 K/uL Final   nRBC 05/24/2024 0.0  0.0 - 0.2 % Final   Performed at [facility], [address], [place], [place] [address]  Sodium 05/24/2024 136  135 - 145 mmol/L Final   Potassium 05/24/2024 3.6  3.5 - 5.1 mmol/L Final   Chloride 05/24/2024 103  98 - 111 mmol/L Final   CO2 05/24/2024 22  22 - 32 mmol/L Final   Glucose, Bld 05/24/2024 100 (H)  70 - 99 mg/dL Final   Glucose reference range applies only to samples taken after fasting for at least 8 hours.   BUN 05/24/2024 6  6 - 20 mg/dL Final   Creatinine, Ser 05/24/2024 0.61  0.61 - 1.24 mg/dL Final   Calcium  05/24/2024 9.4  8.9 - 10.3 mg/dL Final   Phosphorus 92/88/7974 4.9 (H)  2.5 - 4.6 mg/dL Final   Albumin 92/88/7974 3.3 (L)  3.5 - 5.0 g/dL Final   GFR, Estimated 05/24/2024 >60  >60 mL/min Final   Comment: (NOTE) Calculated using the CKD-EPI Creatinine Equation (2021)    Anion gap  05/24/2024 11  5 - 15 Final   Performed at [facility], [address], [place], [place] [address]  Admission on 05/08/2024, Discharged on 05/10/2024  Component Date Value Ref Range Status   Glucose-Capillary 05/08/2024 105 (H)  70 - 99 mg/dL Final   Glucose reference range applies only to samples taken after fasting for at least 8 hours.   Troponin I (High Sensitivity) 05/08/2024 6  <18 ng/L Final   Comment: (NOTE) Elevated high sensitivity troponin I (hsTnI) values and significant  changes across serial measurements may suggest ACS but many other  chronic and acute conditions are known to elevate hsTnI results.  Refer to the Links section for chest pain algorithms and additional  guidance. Performed at [facility], [address] [place], [place] [address]    WBC 05/08/2024 13.3 (H)  4.0 - 10.5 K/uL Final   RBC 05/08/2024 5.23  4.22 - 5.81 MIL/uL Final   Hemoglobin 05/08/2024 14.8  13.0 - 17.0 g/dL Final   HCT 93/74/7974 46.3  39.0 - 52.0 % Final   MCV 05/08/2024 88.5  80.0 - 100.0 fL Final   MCH 05/08/2024 28.3  26.0 - 34.0 pg Final   MCHC 05/08/2024 32.0  30.0 - 36.0 g/dL Final   RDW 93/74/7974 14.4  11.5 - 15.5 % Final   Platelets 05/08/2024 335  150 - 400 K/uL Final   nRBC 05/08/2024 0.0  0.0 - 0.2 % Final   Performed at [facility], [address] [place], [place] [address]   Sodium 05/08/2024 142  135 - 145 mmol/L Final   Potassium 05/08/2024 4.2  3.5 - 5.1 mmol/L Final   Chloride 05/08/2024 103  98 - 111 mmol/L Final   CO2 05/08/2024 10 (L)  22 - 32 mmol/L Final   Glucose, Bld 05/08/2024 99  70 - 99 mg/dL Final   Glucose reference range applies only to samples taken after fasting for at least 8 hours.   BUN 05/08/2024 8  6 - 20 mg/dL Final   Creatinine, Ser 05/08/2024 1.31 (H)  0.61 - 1.24 mg/dL Final   Calcium  05/08/2024 10.3  8.9 - 10.3 mg/dL Final   Total Protein 93/74/7974 9.4 (H)  6.5 - 8.1 g/dL Final   Albumin 93/74/7974 4.8  3.5 - 5.0 g/dL  Final   AST 93/74/7974 35  15 - 41 U/L Final   ALT 05/08/2024 15  0 - 44 U/L Final   Alkaline Phosphatase 05/08/2024 109  38 - 126 U/L Final   Total Bilirubin 05/08/2024 2.4 (H)  0.0 -  1.2 mg/dL Final   GFR, Estimated 05/08/2024 >60  >60 mL/min Final   Comment: (NOTE) Calculated using the CKD-EPI Creatinine Equation (2021)    Anion gap 05/08/2024 29 (H)  5 - 15 Final   Comment: ELECTROLYTES REPEATED TO VERIFY Performed at [facility], [address], [place], [place] [address]    Troponin I (High Sensitivity) 05/08/2024 7  <18 ng/L Final   Comment: (NOTE) Elevated high sensitivity troponin I (hsTnI) values and significant  changes across serial measurements may suggest ACS but many other  chronic and acute conditions are known to elevate hsTnI results.  Refer to the Links section for chest pain algorithms and additional  guidance. Performed at [facility], [address] [place], [place] [address]    Lipase 05/08/2024 34  11 - 51 U/L Final   Performed at [facility], [address], [place], [place] [address]   Alcohol, Ethyl (B) 05/09/2024 <15  <15 mg/dL Final   Comment: (NOTE) For medical purposes only. Performed at [facility], [address] [place], [place] [address]    Lactic Acid, Venous 05/09/2024 1.2  0.5 - 1.9 mmol/L Final   Performed at [facility], [address], [place], [place] [address]   Color, Urine 05/09/2024 YELLOW  YELLOW Final   APPearance 05/09/2024 CLEAR  CLEAR Final   Specific Gravity, Urine 05/09/2024 1.039 (H)  1.005 - 1.030 Final   pH 05/09/2024 5.0  5.0 - 8.0 Final   Glucose, UA 05/09/2024 NEGATIVE  NEGATIVE mg/dL Final   Hgb urine dipstick 05/09/2024 MODERATE (A)  NEGATIVE Final   Bilirubin Urine 05/09/2024 NEGATIVE  NEGATIVE Final   Ketones, ur 05/09/2024 80 (A)  NEGATIVE mg/dL Final   Protein, ur 93/73/7974 >=300 (A)  NEGATIVE mg/dL Final   Nitrite 93/73/7974 NEGATIVE  NEGATIVE Final   Leukocytes,Ua  05/09/2024 NEGATIVE  NEGATIVE Final   RBC / HPF 05/09/2024 6-10  0 - 5 RBC/hpf Final   WBC, UA 05/09/2024 0-5  0 - 5 WBC/hpf Final   Bacteria, UA 05/09/2024 NONE SEEN  NONE SEEN Final   Squamous Epithelial / HPF 05/09/2024 6-10  0 - 5 /HPF Final   Mucus 05/09/2024 PRESENT   Final   Performed at [facility], [address], [place], [place] [address]   Sodium 05/09/2024 132 (L)  135 - 145 mmol/L Final   DELTA CHECK NOTED   Potassium 05/09/2024 3.6  3.5 - 5.1 mmol/L Final   Chloride 05/09/2024 102  98 - 111 mmol/L Final   CO2 05/09/2024 21 (L)  22 - 32 mmol/L Final   Glucose, Bld 05/09/2024 88  70 - 99 mg/dL Final   Glucose reference range applies only to samples taken after fasting for at least 8 hours.   BUN 05/09/2024 6  6 - 20 mg/dL Final   Creatinine, Ser 05/09/2024 0.74  0.61 - 1.24 mg/dL Final   Calcium  05/09/2024 9.5  8.9 - 10.3 mg/dL Final   GFR, Estimated 05/09/2024 >60  >60 mL/min Final   Comment: (NOTE) Calculated using the CKD-EPI Creatinine Equation (2021)    Anion gap 05/09/2024 9  5 - 15 Final   Performed at [facility], [address] [place], [place] [address]   Total Protein 05/09/2024 7.2  6.5 - 8.1 g/dL Final   Albumin 93/73/7974 3.8  3.5 - 5.0 g/dL Final   AST 93/73/7974 32  15 - 41 U/L Final  ALT 05/09/2024 15  0 - 44 U/L Final   Alkaline Phosphatase 05/09/2024 92  38 - 126 U/L Final   Total Bilirubin 05/09/2024 1.8 (H)  0.0 - 1.2 mg/dL Final   Bilirubin, Direct 05/09/2024 0.4 (H)  0.0 - 0.2 mg/dL Final   Indirect Bilirubin 05/09/2024 1.4 (H)  0.3 - 0.9 mg/dL Final   Performed at [facility], [address], [place], [place] [address]   pH, Ven 05/09/2024 7.43  7.25 - 7.43 Final   pCO2, Ven 05/09/2024 32 (L)  44 - 60 mmHg Final   pO2, Ven 05/09/2024 50 (H)  32 - 45 mmHg Final   Bicarbonate 05/09/2024 21.2  20.0 - 28.0 mmol/L Final   Acid-base deficit 05/09/2024 2.3 (H)  0.0 - 2.0 mmol/L Final   O2 Saturation 05/09/2024 80.6  % Final    Patient temperature 05/09/2024 37.0   Final   Performed at [facility], [address] [place], [place] [address]   WBC 05/09/2024 7.9  4.0 - 10.5 K/uL Final   RBC 05/09/2024 4.75  4.22 - 5.81 MIL/uL Final   Hemoglobin 05/09/2024 13.5  13.0 - 17.0 g/dL Final   HCT 93/73/7974 40.5  39.0 - 52.0 % Final   MCV 05/09/2024 85.3  80.0 - 100.0 fL Final   MCH 05/09/2024 28.4  26.0 - 34.0 pg Final   MCHC 05/09/2024 33.3  30.0 - 36.0 g/dL Final   RDW 93/73/7974 14.2  11.5 - 15.5 % Final   Platelets 05/09/2024 260  150 - 400 K/uL Final   nRBC 05/09/2024 0.0  0.0 - 0.2 % Final   Performed at [facility], [address], [place], [place] [address]  Admission on 04/03/2024, Discharged on 04/12/2024  Component Date Value Ref Range Status   Sodium 04/04/2024 135  135 - 145 mmol/L Final   Potassium 04/04/2024 3.8  3.5 - 5.1 mmol/L Final   Chloride 04/04/2024 102  98 - 111 mmol/L Final   CO2 04/04/2024 22  22 - 32 mmol/L Final   Glucose, Bld 04/04/2024 109 (H)  70 - 99 mg/dL Final   Glucose reference range applies only to samples taken after fasting for at least 8 hours.   BUN 04/04/2024 6  6 - 20 mg/dL Final   Creatinine, Ser 04/04/2024 0.74  0.61 - 1.24 mg/dL Final   Calcium  04/04/2024 9.1  8.9 - 10.3 mg/dL Final   GFR, Estimated 04/04/2024 >60  >60 mL/min Final   Comment: (NOTE) Calculated using the CKD-EPI Creatinine Equation (2021)    Anion gap 04/04/2024 11  5 - 15 Final   Performed at [facility], [address], [place], [place] [address]   Cholesterol 04/04/2024 169  0 - 200 mg/dL Final   Triglycerides 94/77/7974 195 (H)  <150 mg/dL Final   HDL 94/77/7974 56  >40 mg/dL Final   Total CHOL/HDL Ratio 04/04/2024 3.0  RATIO Final   VLDL 04/04/2024 39  0 - 40 mg/dL Final   LDL Cholesterol 04/04/2024 74  0 - 99 mg/dL Final   Comment:        Total Cholesterol/HDL:CHD Risk Coronary Heart Disease Risk Table                     Men   Women  1/2 Average Risk   3.4    3.3  Average Risk       5.0   4.4  2 X Average Risk  9.6   7.1  3 X Average Risk  23.4   11.0        Use the calculated Patient Ratio above and the CHD Risk Table to determine the patient's CHD Risk.        ATP III CLASSIFICATION (LDL):  <100     mg/dL   Optimal  899-870  mg/dL   Near or Above                    Optimal  130-159  mg/dL   Borderline  839-810  mg/dL   High  >809     mg/dL   Very High Performed at [facility], [address], [place], [place] [address]   Admission on 04/02/2024, Discharged on 04/03/2024  Component Date Value Ref Range Status   Sodium 04/02/2024 137  135 - 145 mmol/L Final   Potassium 04/02/2024 3.3 (L)  3.5 - 5.1 mmol/L Final   Chloride 04/02/2024 102  98 - 111 mmol/L Final   CO2 04/02/2024 23  22 - 32 mmol/L Final   Glucose, Bld 04/02/2024 90  70 - 99 mg/dL Final   Glucose reference range applies only to samples taken after fasting for at least 8 hours.   BUN 04/02/2024 <5 (L)  6 - 20 mg/dL Final   Creatinine, Ser 04/02/2024 0.77  0.61 - 1.24 mg/dL Final   Calcium  04/02/2024 9.6  8.9 - 10.3 mg/dL Final   Total Protein 94/79/7974 8.1  6.5 - 8.1 g/dL Final   Albumin 94/79/7974 3.8  3.5 - 5.0 g/dL Final   AST 94/79/7974 38  15 - 41 U/L Final   ALT 04/02/2024 23  0 - 44 U/L Final   Alkaline Phosphatase 04/02/2024 116  38 - 126 U/L Final   Total Bilirubin 04/02/2024 0.8  0.0 - 1.2 mg/dL Final   GFR, Estimated 04/02/2024 >60  >60 mL/min Final   Comment: (NOTE) Calculated using the CKD-EPI Creatinine Equation (2021)    Anion gap 04/02/2024 12  5 - 15 Final   Performed at [facility], [address] [place], [place] [address]   Alcohol, Ethyl (B) 04/02/2024 257 (H)  <15 mg/dL Final   Comment: Please note change in reference range. (NOTE) For medical purposes only. Performed at [facility], [address], [place], [place] [address]    Opiates 04/02/2024 NONE DETECTED  NONE DETECTED  Final   Cocaine 04/02/2024 NONE DETECTED  NONE DETECTED Final   Benzodiazepines 04/02/2024 POSITIVE (A)  NONE DETECTED Final   Amphetamines 04/02/2024 NONE DETECTED  NONE DETECTED Final   Tetrahydrocannabinol 04/02/2024 NONE DETECTED  NONE DETECTED Final   Barbiturates 04/02/2024 NONE DETECTED  NONE DETECTED Final   Comment: (NOTE) DRUG SCREEN FOR MEDICAL PURPOSES ONLY.  IF CONFIRMATION IS NEEDED FOR ANY PURPOSE, NOTIFY LAB WITHIN 5 DAYS.  LOWEST DETECTABLE LIMITS FOR URINE DRUG SCREEN Drug Class                     Cutoff (ng/mL) Amphetamine and metabolites    1000 Barbiturate and metabolites    200 Benzodiazepine                 200 Opiates and metabolites        300 Cocaine and metabolites        300 THC  50 Performed at [facility], [address] [place] [address], [place], [place] [address]    WBC 04/02/2024 6.3  4.0 - 10.5 K/uL Final   RBC 04/02/2024 4.44  4.22 - 5.81 MIL/uL Final   Hemoglobin 04/02/2024 12.7 (L)  13.0 - 17.0 g/dL Final   HCT 94/79/7974 39.1  39.0 - 52.0 % Final   MCV 04/02/2024 88.1  80.0 - 100.0 fL Final   MCH 04/02/2024 28.6  26.0 - 34.0 pg Final   MCHC 04/02/2024 32.5  30.0 - 36.0 g/dL Final   RDW 94/79/7974 14.5  11.5 - 15.5 % Final   Platelets 04/02/2024 448 (H)  150 - 400 K/uL Final   nRBC 04/02/2024 0.0  0.0 - 0.2 % Final   Neutrophils Relative % 04/02/2024 63  % Final   Neutro Abs 04/02/2024 3.9  1.7 - 7.7 K/uL Final   Lymphocytes Relative 04/02/2024 25  % Final   Lymphs Abs 04/02/2024 1.6  0.7 - 4.0 K/uL Final   Monocytes Relative 04/02/2024 10  % Final   Monocytes Absolute 04/02/2024 0.7  0.1 - 1.0 K/uL Final   Eosinophils Relative 04/02/2024 1  % Final   Eosinophils Absolute 04/02/2024 0.1  0.0 - 0.5 K/uL Final   Basophils Relative 04/02/2024 1  % Final   Basophils Absolute 04/02/2024 0.1  0.0 - 0.1 K/uL Final   Immature Granulocytes 04/02/2024 0  % Final   Abs Immature Granulocytes 04/02/2024 0.02  0.00 -  0.07 K/uL Final   Performed at [facility], [address] [place], [place] [address]  No results displayed because visit has over 200 results.    Admission on 01/25/2024, Discharged on 02/03/2024  Component Date Value Ref Range Status   Folate 01/29/2024 22.8  >5.9 ng/mL Final   Performed at [facility], [address], [place], [place] [address]   Hgb A1c MFr Bld 01/29/2024 4.5 (L)  4.8 - 5.6 % Final   Comment: (NOTE) Pre diabetes:          5.7%-6.4%  Diabetes:              >6.4%  Glycemic control for   <7.0% adults with diabetes    Mean Plasma Glucose 01/29/2024 82.45  mg/dL Final   Performed at [facility], [address] [place], [place] [address]   Cholesterol 01/29/2024 174  0 - 200 mg/dL Final   Triglycerides 96/82/7974 165 (H)  <150 mg/dL Final   HDL 96/82/7974 50  >40 mg/dL Final   Total CHOL/HDL Ratio 01/29/2024 3.5  RATIO Final   VLDL 01/29/2024 33  0 - 40 mg/dL Final   LDL Cholesterol 01/29/2024 91  0 - 99 mg/dL Final   Comment:        Total Cholesterol/HDL:CHD Risk Coronary Heart Disease Risk Table                     Men   Women  1/2 Average Risk   3.4   3.3  Average Risk       5.0   4.4  2 X Average Risk   9.6   7.1  3 X Average Risk  23.4   11.0        Use the calculated Patient Ratio above and the CHD Risk Table to determine the patient's CHD Risk.        ATP III CLASSIFICATION (LDL):  <100     mg/dL   Optimal  899-870  mg/dL   Near or Above                    Optimal  130-159  mg/dL   Borderline  839-810  mg/dL   High  >809     mg/dL   Very High Performed at [facility], [address] [place], [place] [address]    RPR Ser Ql 01/29/2024 NON REACTIVE  NON REACTIVE Final   Performed at [facility], [address] [place], [place] [address]   TSH 01/29/2024 4.117  0.350 - 4.500 uIU/mL Final   Comment: Performed by a 3rd Generation assay with a functional sensitivity of  <=0.01 uIU/mL. Performed at [facility], [address], [place], [place] [address]    Vitamin B-12 01/29/2024 585  180 - 914 pg/mL Final   Comment: (NOTE) This assay is not validated for testing neonatal or myeloproliferative syndrome specimens for Vitamin B12 levels. Performed at [facility], [address] [place], [place] [address]    Vit D, 25-Hydroxy 01/29/2024 29.42 (L)  30 - 100 ng/mL Final   Comment: (NOTE) Vitamin D  deficiency has been defined by the Institute of Medicine  and an Endocrine Society practice guideline as a level of serum 25-OH  vitamin D  less than 20 ng/mL (1,2). The Endocrine Society went on to  further define vitamin D  insufficiency as a level between 21 and 29  ng/mL (2).  1. IOM (Institute of Medicine). 2010. Dietary reference intakes for  calcium  and D. [place]: [employer]. 2. Holick MF, Binkley [place], Bischoff-Ferrari HA, et al. Evaluation,  treatment, and prevention of vitamin D  deficiency: an Endocrine  Society clinical practice guideline, JCEM. 2011 Jul; 96(7): 1911-30.  Performed at [facility], [address], [place], [place] [address]    Free T4 02/01/2024 0.74  0.61 - 1.12 ng/dL Final   Comment: (NOTE) Biotin ingestion may interfere with free T4 tests. If the results are inconsistent with the TSH level, previous test results, or the clinical presentation, then consider biotin interference. If needed, order repeat testing after stopping biotin. Performed at [facility], [address] [place], [place] [address]   Admission on 01/23/2024, Discharged on 01/25/2024  Component Date Value Ref Range Status   Glucose-Capillary 01/23/2024 111 (H)  70 - 99 mg/dL Final   Glucose reference range applies only to samples taken after fasting for at least 8 hours.   Sodium 01/23/2024 133 (L)  135 - 145 mmol/L Final   Potassium 01/23/2024 4.3  3.5 - 5.1 mmol/L Final   Chloride  01/23/2024 102  98 - 111 mmol/L Final   CO2 01/23/2024 23  22 - 32 mmol/L Final   Glucose, Bld 01/23/2024 100 (H)  70 - 99 mg/dL Final   Glucose reference range applies only to samples taken after fasting for at least 8 hours.   BUN 01/23/2024 8  6 - 20 mg/dL Final   Creatinine, Ser 01/23/2024 0.92  0.61 - 1.24 mg/dL Final   Calcium  01/23/2024 9.4  8.9 - 10.3 mg/dL Final   Total Protein 96/88/7974 7.4  6.5 - 8.1 g/dL Final   Albumin 96/88/7974 3.6  3.5 - 5.0 g/dL Final   AST 96/88/7974 125 (H)  15 - 41 U/L Final   ALT 01/23/2024 57 (H)  0 - 44 U/L Final   Alkaline Phosphatase 01/23/2024 85  38 - 126 U/L Final   Total Bilirubin  01/23/2024 0.7  0.0 - 1.2 mg/dL Final   GFR, Estimated 01/23/2024 >60  >60 mL/min Final   Comment: (NOTE) Calculated using the CKD-EPI Creatinine Equation (2021)    Anion gap 01/23/2024 8  5 - 15 Final   Performed at [facility], [address], [place], [place] [address]   WBC 01/23/2024 5.2  4.0 - 10.5 K/uL Final   RBC 01/23/2024 3.69 (L)  4.22 - 5.81 MIL/uL Final   Hemoglobin 01/23/2024 11.7 (L)  13.0 - 17.0 g/dL Final   HCT 96/88/7974 35.7 (L)  39.0 - 52.0 % Final   MCV 01/23/2024 96.7  80.0 - 100.0 fL Final   MCH 01/23/2024 31.7  26.0 - 34.0 pg Final   MCHC 01/23/2024 32.8  30.0 - 36.0 g/dL Final   RDW 96/88/7974 13.8  11.5 - 15.5 % Final   Platelets 01/23/2024 291  150 - 400 K/uL Final   nRBC 01/23/2024 0.0  0.0 - 0.2 % Final   Neutrophils Relative % 01/23/2024 65  % Final   Neutro Abs 01/23/2024 3.3  1.7 - 7.7 K/uL Final   Lymphocytes Relative 01/23/2024 16  % Final   Lymphs Abs 01/23/2024 0.8  0.7 - 4.0 K/uL Final   Monocytes Relative 01/23/2024 15  % Final   Monocytes Absolute 01/23/2024 0.8  0.1 - 1.0 K/uL Final   Eosinophils Relative 01/23/2024 3  % Final   Eosinophils Absolute 01/23/2024 0.2  0.0 - 0.5 K/uL Final   Basophils Relative 01/23/2024 1  % Final   Basophils Absolute 01/23/2024 0.1  0.0 - 0.1 K/uL Final   Immature Granulocytes  01/23/2024 0  % Final   Abs Immature Granulocytes 01/23/2024 0.02  0.00 - 0.07 K/uL Final   Performed at [facility], [address] [place], [place] [address]   Magnesium  01/23/2024 2.1  1.7 - 2.4 mg/dL Final   Performed at [facility], [address] [place], [place] [address]   Alcohol, Ethyl (B) 01/23/2024 <10  <10 mg/dL Final   Comment: (NOTE) Lowest detectable limit for serum alcohol is 10 mg/dL.  For medical purposes only. Performed at [facility], [address] [place], [place] [address]    Opiates 01/23/2024 NONE DETECTED  NONE DETECTED Final   Cocaine 01/23/2024 NONE DETECTED  NONE DETECTED Final   Benzodiazepines 01/23/2024 POSITIVE (A)  NONE DETECTED Final   Amphetamines 01/23/2024 NONE DETECTED  NONE DETECTED Final   Tetrahydrocannabinol 01/23/2024 NONE DETECTED  NONE DETECTED Final   Barbiturates 01/23/2024 NONE DETECTED  NONE DETECTED Final   Comment: (NOTE) DRUG SCREEN FOR MEDICAL PURPOSES ONLY.  IF CONFIRMATION IS NEEDED FOR ANY PURPOSE, NOTIFY LAB WITHIN 5 DAYS.  LOWEST DETECTABLE LIMITS FOR URINE DRUG SCREEN Drug Class                     Cutoff (ng/mL) Amphetamine and metabolites    1000 Barbiturate and metabolites    200 Benzodiazepine                 200 Opiates and metabolites        300 Cocaine and metabolites        300 THC                            50 Performed at [facility], [address], [place], [place] [address]    Color, Urine 01/23/2024 YELLOW  YELLOW Final   APPearance 01/23/2024 CLEAR  CLEAR Final   Specific Gravity, Urine 01/23/2024 1.011  1.005 - 1.030 Final   pH 01/23/2024 6.0  5.0 - 8.0 Final   Glucose, UA 01/23/2024 NEGATIVE  NEGATIVE mg/dL Final   Hgb urine dipstick 01/23/2024 NEGATIVE  NEGATIVE Final   Bilirubin Urine 01/23/2024 NEGATIVE  NEGATIVE Final   Ketones, ur 01/23/2024 NEGATIVE  NEGATIVE mg/dL Final   Protein, ur 96/88/7974 NEGATIVE  NEGATIVE mg/dL Final   Nitrite 96/88/7974 NEGATIVE   NEGATIVE Final   Leukocytes,Ua 01/23/2024 NEGATIVE  NEGATIVE Final   Performed at [facility], [address], [place], [place] [address]   Glucose-Capillary 01/23/2024 87  70 - 99 mg/dL Final   Glucose reference range applies only to samples taken after fasting for at least 8 hours.   Sodium 01/23/2024 133 (L)  135 - 145 mmol/L Final   Potassium 01/23/2024 4.3  3.5 - 5.1 mmol/L Final   Chloride 01/23/2024 101  98 - 111 mmol/L Final   BUN 01/23/2024 9  6 - 20 mg/dL Final   Creatinine, Ser 01/23/2024 0.90  0.61 - 1.24 mg/dL Final   Glucose, Bld 96/88/7974 93  70 - 99 mg/dL Final   Glucose reference range applies only to samples taken after fasting for at least 8 hours.   Calcium , Ion 01/23/2024 1.05 (L)  1.15 - 1.40 mmol/L Final   TCO2 01/23/2024 25  22 - 32 mmol/L Final   Hemoglobin 01/23/2024 12.2 (L)  13.0 - 17.0 g/dL Final   HCT 96/88/7974 36.0 (L)  39.0 - 52.0 % Final   HIV Screen 4th Generation wRfx 01/24/2024 Non Reactive  Non Reactive Final   Performed at [facility], [address] [place], [place] [address]   WBC 01/24/2024 4.5  4.0 - 10.5 K/uL Final   RBC 01/24/2024 3.75 (L)  4.22 - 5.81 MIL/uL Final   Hemoglobin 01/24/2024 11.8 (L)  13.0 - 17.0 g/dL Final   HCT 96/87/7974 35.2 (L)  39.0 - 52.0 % Final   MCV 01/24/2024 93.9  80.0 - 100.0 fL Final   MCH 01/24/2024 31.5  26.0 - 34.0 pg Final   MCHC 01/24/2024 33.5  30.0 - 36.0 g/dL Final   RDW 96/87/7974 13.8  11.5 - 15.5 % Final   Platelets 01/24/2024 301  150 - 400 K/uL Final   nRBC 01/24/2024 0.0  0.0 - 0.2 % Final   Performed at [facility], [address] [place], [place] [address]   Sodium 01/24/2024 135  135 - 145 mmol/L Final   Potassium 01/24/2024 3.9  3.5 - 5.1 mmol/L Final   Chloride 01/24/2024 104  98 - 111 mmol/L Final   CO2 01/24/2024 22  22 - 32 mmol/L Final   Glucose, Bld 01/24/2024 96  70 - 99 mg/dL Final   Glucose reference range applies only to samples taken after fasting for at  least 8 hours.   BUN 01/24/2024 8  6 - 20 mg/dL Final   Creatinine, Ser 01/24/2024 0.96  0.61 - 1.24 mg/dL Final   Calcium  01/24/2024 9.5  8.9 - 10.3 mg/dL Final   Total Protein 96/87/7974 7.5  6.5 - 8.1 g/dL Final   Albumin 96/87/7974 3.6  3.5 - 5.0 g/dL Final   AST 96/87/7974 86 (H)  15 - 41 U/L Final   ALT 01/24/2024 51 (H)  0 - 44 U/L Final   Alkaline Phosphatase 01/24/2024 81  38 - 126 U/L Final  Total Bilirubin 01/24/2024 0.7  0.0 - 1.2 mg/dL Final   GFR, Estimated 01/24/2024 >60  >60 mL/min Final   Comment: (NOTE) Calculated using the CKD-EPI Creatinine Equation (2021)    Anion gap 01/24/2024 9  5 - 15 Final   Performed at [facility], [address], [place], [place] [address]   WBC 01/25/2024 4.6  4.0 - 10.5 K/uL Final   RBC 01/25/2024 3.72 (L)  4.22 - 5.81 MIL/uL Final   Hemoglobin 01/25/2024 11.7 (L)  13.0 - 17.0 g/dL Final   HCT 96/86/7974 35.1 (L)  39.0 - 52.0 % Final   MCV 01/25/2024 94.4  80.0 - 100.0 fL Final   MCH 01/25/2024 31.5  26.0 - 34.0 pg Final   MCHC 01/25/2024 33.3  30.0 - 36.0 g/dL Final   RDW 96/86/7974 13.8  11.5 - 15.5 % Final   Platelets 01/25/2024 292  150 - 400 K/uL Final   nRBC 01/25/2024 0.0  0.0 - 0.2 % Final   Performed at [facility], [address], [place], [place] [address]   Sodium 01/25/2024 138  135 - 145 mmol/L Final   Potassium 01/25/2024 4.3  3.5 - 5.1 mmol/L Final   Chloride 01/25/2024 104  98 - 111 mmol/L Final   CO2 01/25/2024 24  22 - 32 mmol/L Final   Glucose, Bld 01/25/2024 93  70 - 99 mg/dL Final   Glucose reference range applies only to samples taken after fasting for at least 8 hours.   BUN 01/25/2024 8  6 - 20 mg/dL Final   Creatinine, Ser 01/25/2024 0.79  0.61 - 1.24 mg/dL Final   Calcium  01/25/2024 9.2  8.9 - 10.3 mg/dL Final   GFR, Estimated 01/25/2024 >60  >60 mL/min Final   Comment: (NOTE) Calculated using the CKD-EPI Creatinine Equation (2021)    Anion gap 01/25/2024 10  5 - 15 Final   Performed at  [facility], [address] [place], [place] [address]  Admission on 01/18/2024, Discharged on 01/23/2024  Component Date Value Ref Range Status   POC Amphetamine UR 01/18/2024 None Detected  NONE DETECTED (Cut Off Level 1000 ng/mL) Final   POC Secobarbital (BAR) 01/18/2024 None Detected  NONE DETECTED (Cut Off Level 300 ng/mL) Final   POC Buprenorphine (BUP) 01/18/2024 None Detected  NONE DETECTED (Cut Off Level 10 ng/mL) Final   POC Oxazepam (BZO) 01/18/2024 Positive (A)  NONE DETECTED (Cut Off Level 300 ng/mL) Final   POC Cocaine UR 01/18/2024 None Detected  NONE DETECTED (Cut Off Level 300 ng/mL) Final   POC Methamphetamine UR 01/18/2024 None Detected  NONE DETECTED (Cut Off Level 1000 ng/mL) Final   POC Morphine  01/18/2024 None Detected  NONE DETECTED (Cut Off Level 300 ng/mL) Final   POC Methadone UR 01/18/2024 None Detected  NONE DETECTED (Cut Off Level 300 ng/mL) Final   POC Oxycodone  UR 01/18/2024 None Detected  NONE DETECTED (Cut Off Level 100 ng/mL) Final   POC Marijuana UR 01/18/2024 Positive (A)  NONE DETECTED (Cut Off Level 50 ng/mL) Final   Alcohol, Ethyl (B) 01/18/2024 <10  <10 mg/dL Final   Comment: (NOTE) Lowest detectable limit for serum alcohol is 10 mg/dL.  For medical purposes only. Performed at [facility], [address], [place], [place] [address]    Sodium 01/18/2024 138  135 - 145 mmol/L Final   Potassium 01/18/2024 3.7  3.5 - 5.1 mmol/L Final   Chloride 01/18/2024 95 (L)  98 - 111  mmol/L Final   CO2 01/18/2024 26  22 - 32 mmol/L Final   Glucose, Bld 01/18/2024 94  70 - 99 mg/dL Final   Glucose reference range applies only to samples taken after fasting for at least 8 hours.   BUN 01/18/2024 <5 (L)  6 - 20 mg/dL Final   Creatinine, Ser 01/18/2024 0.76  0.61 - 1.24 mg/dL Final   Calcium  01/18/2024 9.2  8.9 - 10.3 mg/dL Final   Total Protein 96/93/7974 7.9  6.5 - 8.1 g/dL Final   Albumin 96/93/7974 3.7  3.5 - 5.0 g/dL Final   AST 96/93/7974 68  (H)  15 - 41 U/L Final   ALT 01/18/2024 23  0 - 44 U/L Final   Alkaline Phosphatase 01/18/2024 111  38 - 126 U/L Final   Total Bilirubin 01/18/2024 1.4 (H)  0.0 - 1.2 mg/dL Final   GFR, Estimated 01/18/2024 >60  >60 mL/min Final   Comment: (NOTE) Calculated using the CKD-EPI Creatinine Equation (2021)    Anion gap 01/18/2024 17 (H)  5 - 15 Final   Performed at [facility], [address], [place], [place] [address]   Sodium 01/21/2024 134 (L)  135 - 145 mmol/L Final   Potassium 01/21/2024 4.1  3.5 - 5.1 mmol/L Final   Chloride 01/21/2024 99  98 - 111 mmol/L Final   CO2 01/21/2024 23  22 - 32 mmol/L Final   Glucose, Bld 01/21/2024 80  70 - 99 mg/dL Final   Glucose reference range applies only to samples taken after fasting for at least 8 hours.   BUN 01/21/2024 8  6 - 20 mg/dL Final   Creatinine, Ser 01/21/2024 0.69  0.61 - 1.24 mg/dL Final   Calcium  01/21/2024 9.9  8.9 - 10.3 mg/dL Final   Total Protein 96/90/7974 7.8  6.5 - 8.1 g/dL Final   Albumin 96/90/7974 3.8  3.5 - 5.0 g/dL Final   AST 96/90/7974 78 (H)  15 - 41 U/L Final   ALT 01/21/2024 33  0 - 44 U/L Final   Alkaline Phosphatase 01/21/2024 90  38 - 126 U/L Final   Total Bilirubin 01/21/2024 0.7  0.0 - 1.2 mg/dL Final   GFR, Estimated 01/21/2024 >60  >60 mL/min Final   Comment: (NOTE) Calculated using the CKD-EPI Creatinine Equation (2021)    Anion gap 01/21/2024 12  5 - 15 Final   Performed at [facility], [address] [place], [place] [address]   Prothrombin Time 01/21/2024 13.0  11.4 - 15.2 seconds Final   INR 01/21/2024 1.0  0.8 - 1.2 Final   Comment: (NOTE) INR goal varies based on device and disease states. Performed at [facility], [address], [place], [place] [address]    WBC 01/21/2024 5.5  4.0 - 10.5 K/uL Final   RBC 01/21/2024 3.75 (L)  4.22 - 5.81 MIL/uL Final   Hemoglobin 01/21/2024 11.9 (L)  13.0 - 17.0 g/dL Final   HCT 96/90/7974 36.1 (L)  39.0 - 52.0 % Final   MCV  01/21/2024 96.3  80.0 - 100.0 fL Final   MCH 01/21/2024 31.7  26.0 - 34.0 pg Final   MCHC 01/21/2024 33.0  30.0 - 36.0 g/dL Final   RDW 96/90/7974 14.3  11.5 - 15.5 % Final   Platelets 01/21/2024 261  150 - 400 K/uL Final   nRBC 01/21/2024 0.0  0.0 - 0.2 % Final   Performed at [facility], [address], [place],  [place] [address]   Magnesium  01/21/2024 1.9  1.7 - 2.4 mg/dL Final   Performed at [facility], [address] [place], [place] [address]  Admission on 01/16/2024, Discharged on 01/17/2024  Component Date Value Ref Range Status   Sodium 01/17/2024 138  135 - 145 mmol/L Final   Potassium 01/17/2024 3.0 (L)  3.5 - 5.1 mmol/L Final   Chloride 01/17/2024 101  98 - 111 mmol/L Final   CO2 01/17/2024 23  22 - 32 mmol/L Final   Glucose, Bld 01/17/2024 102 (H)  70 - 99 mg/dL Final   Glucose reference range applies only to samples taken after fasting for at least 8 hours.   BUN 01/17/2024 5 (L)  6 - 20 mg/dL Final   Creatinine, Ser 01/17/2024 0.57 (L)  0.61 - 1.24 mg/dL Final   Calcium  01/17/2024 8.4 (L)  8.9 - 10.3 mg/dL Final   Total Protein 96/94/7974 7.8  6.5 - 8.1 g/dL Final   Albumin 96/94/7974 3.8  3.5 - 5.0 g/dL Final   AST 96/94/7974 129 (H)  15 - 41 U/L Final   ALT 01/17/2024 27  0 - 44 U/L Final   Alkaline Phosphatase 01/17/2024 108  38 - 126 U/L Final   Total Bilirubin 01/17/2024 0.6  0.0 - 1.2 mg/dL Final   GFR, Estimated 01/17/2024 >60  >60 mL/min Final   Comment: (NOTE) Calculated using the CKD-EPI Creatinine Equation (2021)    Anion gap 01/17/2024 14  5 - 15 Final   Performed at [facility], [address] [place], [place] [address]   WBC 01/17/2024 3.6 (L)  4.0 - 10.5 K/uL Final   RBC 01/17/2024 3.54 (L)  4.22 - 5.81 MIL/uL Final   Hemoglobin 01/17/2024 10.9 (L)  13.0 - 17.0 g/dL Final   HCT 96/94/7974 33.8 (L)  39.0 - 52.0 % Final   MCV 01/17/2024 95.5  80.0 - 100.0 fL Final   MCH 01/17/2024 30.8  26.0 - 34.0 pg Final   MCHC  01/17/2024 32.2  30.0 - 36.0 g/dL Final   RDW 96/94/7974 14.8  11.5 - 15.5 % Final   Platelets 01/17/2024 218  150 - 400 K/uL Final   nRBC 01/17/2024 0.0  0.0 - 0.2 % Final   Neutrophils Relative % 01/17/2024 58  % Final   Neutro Abs 01/17/2024 2.1  1.7 - 7.7 K/uL Final   Lymphocytes Relative 01/17/2024 29  % Final   Lymphs Abs 01/17/2024 1.0  0.7 - 4.0 K/uL Final   Monocytes Relative 01/17/2024 10  % Final   Monocytes Absolute 01/17/2024 0.4  0.1 - 1.0 K/uL Final   Eosinophils Relative 01/17/2024 2  % Final   Eosinophils Absolute 01/17/2024 0.1  0.0 - 0.5 K/uL Final   Basophils Relative 01/17/2024 1  % Final   Basophils Absolute 01/17/2024 0.1  0.0 - 0.1 K/uL Final   Immature Granulocytes 01/17/2024 0  % Final   Abs Immature Granulocytes 01/17/2024 0.01  0.00 - 0.07 K/uL Final   Performed at [facility], [address] [place] [address], [place], [place] [address]   Alcohol, Ethyl (B) 01/17/2024 499 (HH)  <10 mg/dL Final   Comment: CRITICAL RESULT CALLED TO, READ BACK BY AND VERIFIED WITH MAYWEATHER, S. RN AT 0132 ON 3.5.25. FA (NOTE) Lowest detectable limit for serum alcohol is 10 mg/dL.  For medical purposes only. Performed at [facility], [address], [place], [place] [address]   There may be more visits with results  that are not included.    Blood Alcohol level:  Lab Results  Component Value Date   [facility] <15 05/27/2024   ETH <15 05/20/2024    Metabolic Disorder Labs: Lab Results  Component Value Date   HGBA1C 4.4 (L) 05/20/2024   MPG 79.58 05/20/2024   MPG 82.45 01/29/2024   No results found for: PROLACTIN Lab Results  Component Value Date   CHOL 208 (H) 05/20/2024   TRIG 168 (H) 05/20/2024   HDL 79 05/20/2024   CHOLHDL 2.6 05/20/2024   VLDL 34 05/20/2024   LDLCALC 95 05/20/2024   LDLCALC 74 04/04/2024    Therapeutic Lab Levels: No results found for: LITHIUM No results found for: VALPROATE No results found for:  CBMZ  Physical Findings   AIMS    Flowsheet Row [facility]  AIMS Total Score 0   AUDIT    Flowsheet Row Office Visit from 04/22/2024 in [facility] Admission (Discharged) [facility] Ad[facility] (Discharged) [facility] Ad[facility] (Discharged[facility]  Alcohol Use Disorder Identification Test Final Score (AUDIT) 6 10 0 29   CAGE-AID    Flowsheet Row ED to Hosp-Admission (Discharged) from 06/22/2021 in [facility]  CAGE-AID Score 3   GAD-7    Flowsheet Row Office Visit from 04/22/2024 in [facility]  Total GAD-7 Score 0   Mini-Mental    Flowsheet Row Office Visit from 05/02/2024 in [facility]  Total Score (max 30 points ) 21   PHQ2-9    Flowsheet Row ED from 05/27/2024 in [facility] Office Visit from 04/22/2024 in [facility] Office Visit from 02/07/2024 in [facility] [place] ED from 01/18/2024 in [facility] Office Visit from 06/10/2022 in [facility] [place]  PHQ-2 Total Score 6 0 0 0 0  PHQ-9 Total Score 12 0 -- 3 --   Flowsheet Row ED from 05/27/2024 in [facility] Most recent reading at 05/27/2024 11:10 PM ED from 05/27/2024 in [facility] Most recent reading at 05/27/2024  6:22 PM ED to Hosp-Admission (Discharged) from 05/20/2024 in [facility] Most recent reading at 05/20/2024 10:08 AM  C-SSRS RISK CATEGORY No Risk No Risk No Risk     Musculoskeletal  Strength & Muscle Tone: within  normal limits Gait & Station: normal Patient leans: N/A  Psychiatric Specialty Exam  Presentation  General Appearance:  Fairly Groomed  Eye Contact: Fair  Speech: Clear and Coherent  Speech Volume: Normal  Handedness: Right   Mood and Affect  Mood: Depressed; Anxious  Affect: Congruent   Thought Process  Thought Processes: Coherent  Descriptions of Associations:Intact  Orientation:Full (Time, Place and Person)  Thought Content:Logical  Diagnosis of Schizophrenia or Schizoaffective disorder in past: No    Hallucinations:Hallucinations: None  Ideas of Reference:None  Suicidal Thoughts:Suicidal Thoughts: No  Homicidal Thoughts:Homicidal Thoughts: No   Sensorium  Memory: Immediate Fair  Judgment: Fair  Insight: Fair   Art therapist  Concentration: Fair  Attention Span: Fair  Recall: Fair  Fund of Knowledge: Poor  Language: Fair   Psychomotor Activity  Psychomotor Activity:Psychomotor Activity: Normal   Assets  Assets: Resilience   Sleep  Sleep:Sleep: Fair  Estimated Sleeping Duration (Last 24 Hours): 12.00-16.00 hours  No data recorded  Physical Exam  Physical Exam Constitutional:      Appearance: Normal appearance.  Musculoskeletal:        General: Normal range of motion.     Cervical back: Normal range of motion.  Neurological:     General: No focal deficit present.     Mental Status: He is alert and oriented to person, place, and time.    Review of Systems  Psychiatric/Behavioral:  Positive for depression and substance abuse. Negative for hallucinations, memory loss and suicidal ideas. The patient is nervous/anxious and has insomnia.   All other systems reviewed and are negative.  Blood pressure 103/80, pulse 84, temperature 98.6 F (37 C), temperature source Oral, resp. rate 16, SpO2 94%. There is no height or weight on file to calculate BMI.  Treatment Plan Summary: Daily contact with patient to  assess and evaluate symptoms and progress in treatment and Medication management  Medications Adjustments: -Increase Trazodone  from 50 mg to 100 mg nightly for sleep -Increase Seroquel  from 50 mg to 100 mg for mood stabilization Please see MAR for complete medications list.  Donia Snell, NP 05/29/2024 3:00 PM

## 2024-05-29 NOTE — ED Notes (Signed)
 Patient is in the Dayroom calm and composed watching TV with other colleagues NAD. Denies SI/HI/AVH.  Respirations even and unlabored. Environment secured per policy. Will monitor for safety

## 2024-05-29 NOTE — ED Notes (Signed)
 Sou sleeps well. He vomited twice, early evening and medications time. He received PRN medication Zofran . Since then no vomiting. At 3 AM requested for Ice water and administered. He went back to bed, no other issue noted.

## 2024-05-30 DIAGNOSIS — E785 Hyperlipidemia, unspecified: Secondary | ICD-10-CM | POA: Diagnosis not present

## 2024-05-30 DIAGNOSIS — K219 Gastro-esophageal reflux disease without esophagitis: Secondary | ICD-10-CM

## 2024-05-30 DIAGNOSIS — F1010 Alcohol abuse, uncomplicated: Secondary | ICD-10-CM | POA: Diagnosis not present

## 2024-05-30 DIAGNOSIS — Z87898 Personal history of other specified conditions: Secondary | ICD-10-CM

## 2024-05-30 DIAGNOSIS — F331 Major depressive disorder, recurrent, moderate: Secondary | ICD-10-CM | POA: Diagnosis not present

## 2024-05-30 LAB — BASIC METABOLIC PANEL WITH GFR
Anion gap: 12 (ref 5–15)
BUN: 8 mg/dL (ref 6–20)
CO2: 27 mmol/L (ref 22–32)
Calcium: 9.6 mg/dL (ref 8.9–10.3)
Chloride: 95 mmol/L — ABNORMAL LOW (ref 98–111)
Creatinine, Ser: 0.82 mg/dL (ref 0.61–1.24)
GFR, Estimated: >60 mL/min (ref >60)
Glucose, Bld: 48 mg/dL — ABNORMAL LOW (ref 70–99)
Potassium: 3.6 mmol/L (ref 3.5–5.1)
Sodium: 134 mmol/L — ABNORMAL LOW (ref 135–145)

## 2024-05-30 LAB — URINALYSIS, COMPLETE (UACMP) WITH MICROSCOPIC
Bacteria, UA: NONE SEEN
Bilirubin Urine: NEGATIVE
Glucose, UA: NEGATIVE mg/dL
Ketones, ur: NEGATIVE mg/dL
Leukocytes,Ua: NEGATIVE
Nitrite: NEGATIVE
Protein, ur: NEGATIVE mg/dL
Specific Gravity, Urine: 1.004 — ABNORMAL LOW (ref 1.005–1.030)
pH: 7 (ref 5.0–8.0)

## 2024-05-30 LAB — POCT URINE DRUG SCREEN - MANUAL ENTRY (I-SCREEN)
POC Amphetamine UR: NOT DETECTED
POC Buprenorphine (BUP): NOT DETECTED
POC Cocaine UR: NOT DETECTED
POC Marijuana UR: POSITIVE — AB
POC Methadone UR: NOT DETECTED
POC Methamphetamine UR: NOT DETECTED
POC Morphine: NOT DETECTED
POC Oxazepam (BZO): POSITIVE — AB
POC Oxycodone UR: NOT DETECTED
POC Secobarbital (BAR): NOT DETECTED

## 2024-05-30 LAB — VITAMIN B12: Vitamin B-12: 393 pg/mL (ref 180–914)

## 2024-05-30 LAB — VITAMIN D 25 HYDROXY (VIT D DEFICIENCY, FRACTURES): Vit D, 25-Hydroxy: 59.65 ng/mL (ref 30–100)

## 2024-05-30 LAB — SARS CORONAVIRUS 2 BY RT PCR: SARS Coronavirus 2 by RT PCR: NEGATIVE

## 2024-05-30 MED ORDER — MELATONIN 3 MG PO TABS
3.0000 mg | ORAL_TABLET | Freq: Every day | ORAL | Status: DC
  Administered 2024-05-30 – 2024-06-02 (×4): 3 mg via ORAL
  Filled 2024-05-30 (×4): qty 1

## 2024-05-30 NOTE — ED Notes (Signed)
 Pt is in the dayroom watching TV with peers. Pt denies SI/HI/AVH. Pt has no further complain.No acute distress noted.

## 2024-05-30 NOTE — ED Notes (Signed)
 Pt asleep, CIWA assessment could not be done. Will monitor for safety

## 2024-05-30 NOTE — ED Provider Notes (Signed)
 Behavioral Health Progress Note  Date and Time: 05/30/2024 2:41 PM Name: Xavier Owens MRN:  [id number]  HPI: Xavier Owens is a 53 y.o. male who [facility] [place] [facility] with worsening depressive symptoms & SI as well as alcohol withdrawals symptoms. Per triage:  Pt presents to [facility] unaccompanied. Pt states he is having withdrawls from alcohol starting this morning. Pt reports that he drank yesterday and all weekend. Pt mentions he had a fifth of alcohol yesterday. Pt mentions he is drinking every weekend.  Subjective:  Patient reports feeling alright today. Sleep has improved but remains a chronic issue. He believes naltrexone  helps with his drinking but cannot explain further context of relapse. He reports chronic issues with variability in blood pressure but is okay with plan to discontinue propranolol  until after completion of lorazepam  taper. He also concurs that we should continue to observe him well past the completion of lorazepam  taper given his most recent w/d seizure occurred AFTER completion of taper. Patient claims that he eats well but admits that food intake may not be optimal when he's drinking . . . 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  Diagnosis:  Final diagnoses:  Hyperlipidemia, unspecified hyperlipidemia type  Gastroesophageal reflux disease without esophagitis  Alcohol use disorder  Moderate episode of recurrent major depressive disorder (HCC)  History of seizure due to alcohol withdrawal   Total Time spent with patient: I personally spent 30 minutes on the unit in direct patient care. The direct patient care time included face-to-face time with the patient, reviewing the patient's chart, communicating with other professionals, and coordinating care. Greater than 50% of this time was spent in counseling or coordinating care with the patient regarding goals of hospitalization, psycho-education, and discharge planning needs.  Social  History:    Pain Medications: See MAR Prescriptions: See MAR Over the Counter: See MAR History of alcohol / drug use?: Yes Longest period of sobriety (when/how long): Unsure. Negative Consequences of Use:  (Unsure.) Withdrawal Symptoms: Seizures, Irritability, Nausea / Vomiting Onset of Seizures: Unknown Date of most recent seizure: sometime this year, per his report    Sleep: Fair  Appetite:  Fair  Current Medications:  Current Facility-Administered Medications  Medication Dose Route Frequency Provider Last Rate Last Admin   acetaminophen  (TYLENOL ) tablet 650 mg  650 mg Oral Q6H PRN Trudy Carwin, NP       alum & mag hydroxide-simeth (MAALOX/MYLANTA) 200-200-20 MG/5ML suspension 30 mL  30 mL Oral Q4H PRN Trudy Carwin, NP       atorvastatin  (LIPITOR) tablet 40 mg  40 mg Oral QPM Nkwenti, Doris, NP   40 mg at 05/29/24 1753   DULoxetine  (CYMBALTA ) DR capsule 60 mg  60 mg Oral Daily Nkwenti, Doris, NP   60 mg at 05/30/24 9063   ferrous sulfate  tablet 325 mg  325 mg Oral QHS Nkwenti, Doris, NP   325 mg at 05/29/24 2109   folic acid  (FOLVITE ) tablet 1 mg  1 mg Oral Daily Nkwenti, Donia, NP   1 mg at 05/30/24 9063   gabapentin  (NEURONTIN ) capsule 600 mg  600 mg Oral TID Nkwenti, Doris, NP   600 mg at 05/30/24 9062   hydrOXYzine  (ATARAX ) tablet 25 mg  25 mg Oral Q6H PRN Cole Kandi BROCKS, MD   25 mg at 05/29/24 1841   loperamide  (IMODIUM ) capsule 2-4 mg  2-4 mg Oral PRN Cole Kandi BROCKS, MD       LORazepam  (ATIVAN ) tablet 1 mg  1 mg  Oral Q6H PRN Cole Kandi BROCKS, MD       LORazepam  (ATIVAN ) tablet 1 mg  1 mg Oral BID Elis Sauber C, MD   1 mg at 05/30/24 1030   Followed by   NOREEN ON 05/31/2024] LORazepam  (ATIVAN ) tablet 1 mg  1 mg Oral Daily Sadiq Mccauley C, MD       magnesium  hydroxide (MILK OF MAGNESIA) suspension 30 mL  30 mL Oral Daily PRN Trudy Carwin, NP       multivitamin with minerals tablet 1 tablet  1 tablet Oral Daily Tex Drilling, NP   1 tablet at 05/30/24  9062   naltrexone  (DEPADE) tablet 50 mg  50 mg Oral Daily Nkwenti, Doris, NP   50 mg at 05/30/24 9063   OLANZapine  (ZYPREXA ) injection 10 mg  10 mg Intramuscular TID PRN Trudy Carwin, NP       OLANZapine  (ZYPREXA ) injection 5 mg  5 mg Intramuscular TID PRN Trudy Carwin, NP       OLANZapine  zydis (ZYPREXA ) disintegrating tablet 5 mg  5 mg Oral TID PRN Trudy Carwin, NP       ondansetron  (ZOFRAN -ODT) disintegrating tablet 4 mg  4 mg Oral Q6H PRN Clorinda Wyble C, MD   4 mg at 05/29/24 1556   pantoprazole  (PROTONIX ) EC tablet 40 mg  40 mg Oral Daily Nkwenti, Doris, NP   40 mg at 05/30/24 9062   propranolol  (INDERAL ) tablet 10 mg  10 mg Oral BID Tex Drilling, NP   10 mg at 05/29/24 2109   QUEtiapine  (SEROQUEL ) tablet 100 mg  100 mg Oral QHS Tex Drilling, NP   100 mg at 05/29/24 2109   thiamine  (VITAMIN B1) tablet 100 mg  100 mg Oral Daily Cole Kandi BROCKS, MD   100 mg at 05/30/24 9062   traZODone  (DESYREL ) tablet 100 mg  100 mg Oral QHS Tex Drilling, NP   100 mg at 05/29/24 2109   Current Outpatient Medications  Medication Sig Dispense Refill   atorvastatin  (LIPITOR) 40 MG tablet Take 1 tablet (40 mg total) by mouth daily. 30 tablet 0   Cholecalciferol  (VITAMIN D -3 PO) Take 1 capsule by mouth daily.     DULoxetine  (CYMBALTA ) 60 MG capsule Take 1 capsule (60 mg total) by mouth daily. 30 capsule 3   ferrous sulfate  325 (65 FE) MG tablet Take 1 tablet (325 mg total) by mouth at bedtime. 30 tablet 0   folic acid  (FOLVITE ) 1 MG tablet Take 1 tablet (1 mg total) by mouth daily. 30 tablet 0   gabapentin  (NEURONTIN ) 300 MG capsule Take 600 mg by mouth 3 (three) times daily.     hydrOXYzine  (ATARAX ) 25 MG tablet Take 1 tablet (25 mg total) by mouth every 6 (six) hours as needed for anxiety (or CIWA score </= 10). 90 tablet 3   Multiple Vitamin (MULTIVITAMIN WITH MINERALS) TABS tablet Take 1 tablet by mouth daily.     naltrexone  (DEPADE) 50 MG tablet Take 1 tablet (50 mg total) by mouth daily. 30  tablet 3   pantoprazole  (PROTONIX ) 40 MG tablet Take 1 tablet (40 mg total) by mouth daily. 30 tablet 2   propranolol  (INDERAL ) 10 MG tablet Take 10 mg by mouth 2 (two) times daily.     QUEtiapine  (SEROQUEL ) 50 MG tablet Take 1 tablet (50 mg total) by mouth at bedtime. 30 tablet 0   thiamine  (VITAMIN B1) 100 MG tablet Take 100 mg by mouth daily.     traZODone  (DESYREL ) 50 MG tablet  Take 1 tablet (50 mg total) by mouth at bedtime. 30 tablet 0    Labs  Lab Results:  Admission on 05/27/2024  Component Date Value Ref Range Status   WBC 05/27/2024 8.2  4.0 - 10.5 K/uL Final   RBC 05/27/2024 4.86  4.22 - 5.81 MIL/uL Final   Hemoglobin 05/27/2024 13.9  13.0 - 17.0 g/dL Final   HCT 92/85/7974 41.7  39.0 - 52.0 % Final   MCV 05/27/2024 85.8  80.0 - 100.0 fL Final   MCH 05/27/2024 28.6  26.0 - 34.0 pg Final   MCHC 05/27/2024 33.3  30.0 - 36.0 g/dL Final   RDW 92/85/7974 14.3  11.5 - 15.5 % Final   Platelets 05/27/2024 346  150 - 400 K/uL Final   nRBC 05/27/2024 0.0  0.0 - 0.2 % Final   Neutrophils Relative % 05/27/2024 64  % Final   Neutro Abs 05/27/2024 5.3  1.7 - 7.7 K/uL Final   Lymphocytes Relative 05/27/2024 18  % Final   Lymphs Abs 05/27/2024 1.5  0.7 - 4.0 K/uL Final   Monocytes Relative 05/27/2024 16  % Final   Monocytes Absolute 05/27/2024 1.3 (H)  0.1 - 1.0 K/uL Final   Eosinophils Relative 05/27/2024 1  % Final   Eosinophils Absolute 05/27/2024 0.1  0.0 - 0.5 K/uL Final   Basophils Relative 05/27/2024 1  % Final   Basophils Absolute 05/27/2024 0.1  0.0 - 0.1 K/uL Final   Immature Granulocytes 05/27/2024 0  % Final   Abs Immature Granulocytes 05/27/2024 0.03  0.00 - 0.07 K/uL Final   Performed at [facility], [address] [place], [place] [address]   Sodium 05/27/2024 132 (L)  135 - 145 mmol/L Final   Potassium 05/27/2024 3.2 (L)  3.5 - 5.1 mmol/L Final   Chloride 05/27/2024 94 (L)  98 - 111 mmol/L Final   CO2 05/27/2024 21 (L)  22 - 32 mmol/L Final   Glucose, Bld  05/27/2024 97  70 - 99 mg/dL Final   Glucose reference range applies only to samples taken after fasting for at least 8 hours.   BUN 05/27/2024 7  6 - 20 mg/dL Final   Creatinine, Ser 05/27/2024 0.93  0.61 - 1.24 mg/dL Final   Calcium  05/27/2024 10.0  8.9 - 10.3 mg/dL Final   Total Protein 92/85/7974 7.9  6.5 - 8.1 g/dL Final   Albumin 92/85/7974 4.2  3.5 - 5.0 g/dL Final   AST 92/85/7974 47 (H)  15 - 41 U/L Final   ALT 05/27/2024 24  0 - 44 U/L Final   Alkaline Phosphatase 05/27/2024 120  38 - 126 U/L Final   Total Bilirubin 05/27/2024 0.9  0.0 - 1.2 mg/dL Final   GFR, Estimated 05/27/2024 >60  >60 mL/min Final   Comment: (NOTE) Calculated using the CKD-EPI Creatinine Equation (2021)    Anion gap 05/27/2024 17 (H)  5 - 15 Final   Performed at [facility], [address] [place], [place] [address]   Alcohol, Ethyl (B) 05/27/2024 <15  <15 mg/dL Final   Comment: (NOTE) For medical purposes only. Performed at [facility], [address], [place], [place] [address]    TSH 05/27/2024 3.436  0.350 - 4.500 uIU/mL Final   Comment: Performed by a 3rd Generation assay with a functional sensitivity of <=0.01 uIU/mL. Performed at [facility], [address], [place], [place] [address]    POC Amphetamine UR 05/30/2024 None Detected  NONE  DETECTED (Cut Off Level 1000 ng/mL) Final   POC Secobarbital (BAR) 05/30/2024 None Detected  NONE DETECTED (Cut Off Level 300 ng/mL) Final   POC Buprenorphine (BUP) 05/30/2024 None Detected  NONE DETECTED (Cut Off Level 10 ng/mL) Final   POC Oxazepam (BZO) 05/30/2024 Positive (A)  NONE DETECTED (Cut Off Level 300 ng/mL) Final   POC Cocaine UR 05/30/2024 None Detected  NONE DETECTED (Cut Off Level 300 ng/mL) Final   POC Methamphetamine UR 05/30/2024 None Detected  NONE DETECTED (Cut Off Level 1000 ng/mL) Final   POC Morphine  05/30/2024 None Detected  NONE DETECTED (Cut Off Level 300 ng/mL) Final   POC Methadone UR 05/30/2024 None Detected   NONE DETECTED (Cut Off Level 300 ng/mL) Final   POC Oxycodone  UR 05/30/2024 None Detected  NONE DETECTED (Cut Off Level 100 ng/mL) Final   POC Marijuana UR 05/30/2024 Positive (A)  NONE DETECTED (Cut Off Level 50 ng/mL) Final  Admission on 05/20/2024, Discharged on 05/24/2024  Component Date Value Ref Range Status   WBC 05/20/2024 15.9 (H)  4.0 - 10.5 K/uL Final   RBC 05/20/2024 4.91  4.22 - 5.81 MIL/uL Final   Hemoglobin 05/20/2024 13.8  13.0 - 17.0 g/dL Final   HCT 92/92/7974 42.8  39.0 - 52.0 % Final   MCV 05/20/2024 87.2  80.0 - 100.0 fL Final   MCH 05/20/2024 28.1  26.0 - 34.0 pg Final   MCHC 05/20/2024 32.2  30.0 - 36.0 g/dL Final   RDW 92/92/7974 14.2  11.5 - 15.5 % Final   Platelets 05/20/2024 353  150 - 400 K/uL Final   nRBC 05/20/2024 0.0  0.0 - 0.2 % Final   Performed at [facility], [address], [place], [place] [address]   Sodium 05/20/2024 135  135 - 145 mmol/L Final   Potassium 05/20/2024 3.3 (L)  3.5 - 5.1 mmol/L Final   Chloride 05/20/2024 99  98 - 111 mmol/L Final   CO2 05/20/2024 11 (L)  22 - 32 mmol/L Final   Glucose, Bld 05/20/2024 87  70 - 99 mg/dL Final   Glucose reference range applies only to samples taken after fasting for at least 8 hours.   BUN 05/20/2024 5 (L)  6 - 20 mg/dL Final   Creatinine, Ser 05/20/2024 1.16  0.61 - 1.24 mg/dL Final   Calcium  05/20/2024 9.5  8.9 - 10.3 mg/dL Final   Total Protein 92/92/7974 7.9  6.5 - 8.1 g/dL Final   Albumin 92/92/7974 3.8  3.5 - 5.0 g/dL Final   AST 92/92/7974 52 (H)  15 - 41 U/L Final   ALT 05/20/2024 21  0 - 44 U/L Final   Alkaline Phosphatase 05/20/2024 97  38 - 126 U/L Final   Total Bilirubin 05/20/2024 2.3 (H)  0.0 - 1.2 mg/dL Final   GFR, Estimated 05/20/2024 >60  >60 mL/min Final   Comment: (NOTE) Calculated using the CKD-EPI Creatinine Equation (2021)    Anion gap 05/20/2024 25 (H)  5 - 15 Corrected   Comment: REPEATED TO VERIFY Performed at [facility], [address]  [place], [place] [address] CORRECTED ON 07/07 AT 1105: PREVIOUSLY REPORTED AS 25 CRITICAL RESULT CALLED TO, READ BACK BY AND VERIFIED WITH DEVOLT,TRAVIS RN 1047 05/20/24 AMIREHSNAIF REPEATED TO VERIFY    Lipase 05/20/2024 32  11 - 51 U/L Final   Performed at [facility], [address] [place], [place] [address]   Troponin I (High Sensitivity) 05/20/2024 131 (HH)  <18  ng/L Final   Comment: CRITICAL RESULT CALLED TO, READ BACK BY AND VERIFIED WITH DEVOLT,TRAVIS RN 1047 05/20/24 AMIREHSNAIF (NOTE) Elevated high sensitivity troponin I (hsTnI) values and significant  changes across serial measurements may suggest ACS but many other  chronic and acute conditions are known to elevate hsTnI results.  Refer to the Links section for chest pain algorithms and additional  guidance. Performed at [facility], [address], [place], [place] [address]    Troponin I (High Sensitivity) 05/20/2024 76 (H)  <18 ng/L Final   Comment: (NOTE) Elevated high sensitivity troponin I (hsTnI) values and significant  changes across serial measurements may suggest ACS but many other  chronic and acute conditions are known to elevate hsTnI results.  Refer to the Links section for chest pain algorithms and additional  guidance. Performed at [facility], [address], [place], [place] [address]    pH, Ven 05/20/2024 7.443 (H)  7.25 - 7.43 Final   pCO2, Ven 05/20/2024 22.4 (L)  44 - 60 mmHg Final   pO2, Ven 05/20/2024 71 (H)  32 - 45 mmHg Final   Bicarbonate 05/20/2024 15.3 (L)  20.0 - 28.0 mmol/L Final   TCO2 05/20/2024 16 (L)  22 - 32 mmol/L Final   O2 Saturation 05/20/2024 95  % Final   Acid-base deficit 05/20/2024 6.0 (H)  0.0 - 2.0 mmol/L Final   Sodium 05/20/2024 128 (L)  135 - 145 mmol/L Final   Potassium 05/20/2024 5.8 (H)  3.5 - 5.1 mmol/L Final   Calcium , Ion 05/20/2024 0.94 (L)  1.15 - 1.40 mmol/L Final   HCT 05/20/2024 48.0  39.0 - 52.0 % Final   Hemoglobin 05/20/2024 16.3  13.0 -  17.0 g/dL Final   Sample type 92/92/7974 VENOUS   Final   Color, Urine 05/20/2024 AMBER (A)  YELLOW Final   BIOCHEMICALS MAY BE AFFECTED BY COLOR   APPearance 05/20/2024 HAZY (A)  CLEAR Final   Specific Gravity, Urine 05/20/2024 1.026  1.005 - 1.030 Final   pH 05/20/2024 5.0  5.0 - 8.0 Final   Glucose, UA 05/20/2024 NEGATIVE  NEGATIVE mg/dL Final   Hgb urine dipstick 05/20/2024 SMALL (A)  NEGATIVE Final   Bilirubin Urine 05/20/2024 NEGATIVE  NEGATIVE Final   Ketones, ur 05/20/2024 80 (A)  NEGATIVE mg/dL Final   Protein, ur 92/92/7974 100 (A)  NEGATIVE mg/dL Final   Nitrite 92/92/7974 NEGATIVE  NEGATIVE Final   Leukocytes,Ua 05/20/2024 NEGATIVE  NEGATIVE Final   RBC / HPF 05/20/2024 0-5  0 - 5 RBC/hpf Final   WBC, UA 05/20/2024 0-5  0 - 5 WBC/hpf Final   Bacteria, UA 05/20/2024 RARE (A)  NONE SEEN Final   Squamous Epithelial / HPF 05/20/2024 0-5  0 - 5 /HPF Final   Mucus 05/20/2024 PRESENT   Final   Hyaline Casts, UA 05/20/2024 PRESENT   Final   Performed at [facility], [address], [place], [place] [address]   Osmolality 05/20/2024 279  275 - 295 mOsm/kg Final   Comment: REPEATED TO VERIFY Performed at [facility], [address] [place], [place] [address]    Weight 05/20/2024 2,528  oz Final   Height 05/20/2024 68  in Final   BP 05/20/2024 122/71  mmHg Final   S' Lateral 05/20/2024 2.50  cm Final   Area-P 1/2 05/20/2024 4.96  cm2 Final   Est EF 05/20/2024 60 - 65%   Final   Cholesterol 05/20/2024 208 (H)  0 - 200 mg/dL Final   Triglycerides 92/92/7974 168 (H)  <150 mg/dL Final   HDL 92/92/7974 79  >40 mg/dL Final   Total CHOL/HDL Ratio 05/20/2024 2.6  RATIO Final   VLDL 05/20/2024 34  0 - 40 mg/dL Final   LDL Cholesterol 05/20/2024 95  0 - 99 mg/dL Final   Comment:        Total Cholesterol/HDL:CHD Risk Coronary Heart Disease Risk Table                     Men   Women  1/2 Average Risk   3.4   3.3  Average Risk       5.0   4.4  2 X Average Risk   9.6    7.1  3 X Average Risk  23.4   11.0        Use the calculated Patient Ratio above and the CHD Risk Table to determine the patient's CHD Risk.        ATP III CLASSIFICATION (LDL):  <100     mg/dL   Optimal  899-870  mg/dL   Near or Above                    Optimal  130-159  mg/dL   Borderline  839-810  mg/dL   High  >809     mg/dL   Very High Performed at [facility], [address] [place], [place] [address]    Hgb A1c MFr Bld 05/20/2024 4.4 (L)  4.8 - 5.6 % Final   Comment: (NOTE) Diagnosis of Diabetes The following HbA1c ranges recommended by the American Diabetes Association (ADA) may be used as an aid in the diagnosis of diabetes mellitus.  Hemoglobin             Suggested A1C NGSP%              Diagnosis  <5.7                   Non Diabetic  5.7-6.4                Pre-Diabetic  >6.4                   Diabetic  <7.0                   Glycemic control for                       adults with diabetes.     Mean Plasma Glucose 05/20/2024 79.58  mg/dL Final   Performed at [facility], [address] [place], [place] [address]   CRP 05/20/2024 3.3 (H)  <1.0 mg/dL Final   Performed at [facility], [address] [place], [place] [address]   Sed Rate 05/20/2024 4  0 - 16 mm/hr Final   Performed at [facility], [address], [place], [place] [address]   Alcohol, Ethyl (B) 05/20/2024 <15  <15 mg/dL Final   Comment: (NOTE) For medical purposes only. Performed at [facility], [address], [place], [place] [address]    Opiates 05/21/2024 NONE DETECTED  NONE DETECTED Final   Cocaine 05/21/2024 NONE DETECTED  NONE DETECTED Final   Benzodiazepines 05/21/2024 NONE DETECTED  NONE DETECTED Final   Amphetamines 05/21/2024 NONE DETECTED  NONE DETECTED Final   Tetrahydrocannabinol 05/21/2024 NONE DETECTED  NONE DETECTED Final   Barbiturates 05/21/2024 NONE DETECTED  NONE DETECTED Final   Comment: (NOTE) DRUG SCREEN FOR MEDICAL  PURPOSES ONLY.  IF CONFIRMATION IS NEEDED FOR ANY PURPOSE, NOTIFY LAB WITHIN 5 DAYS.  LOWEST DETECTABLE LIMITS FOR URINE DRUG SCREEN Drug Class                     Cutoff (ng/mL) Amphetamine and metabolites    1000 Barbiturate and metabolites    200 Benzodiazepine                 200 Opiates and metabolites        300 Cocaine and metabolites        300 THC                            50 Performed at [facility], [address] [place], [place] [address]    Sodium 05/21/2024 134 (L)  135 - 145 mmol/L Final   Potassium 05/21/2024 3.1 (L)  3.5 - 5.1 mmol/L Final   Chloride 05/21/2024 103  98 - 111 mmol/L Final   CO2 05/21/2024 23  22 - 32 mmol/L Final   Glucose, Bld 05/21/2024 116 (H)  70 - 99 mg/dL Final   Glucose reference range applies only to samples taken after fasting for at least 8 hours.   BUN 05/21/2024 7  6 - 20 mg/dL Final   Creatinine, Ser 05/21/2024 0.91  0.61 - 1.24 mg/dL Final   Calcium  05/21/2024 9.0  8.9 - 10.3 mg/dL Final   GFR, Estimated 05/21/2024 >60  >60 mL/min Final   Comment: (NOTE) Calculated using the CKD-EPI Creatinine Equation (2021)    Anion gap 05/21/2024 8  5 - 15 Final   Performed at [facility], [address] [place], [place] [address]   Lactic Acid, Venous 05/20/2024 2.4 (HH)  0.5 - 1.9 mmol/L Final   Comment: CRITICAL RESULT CALLED TO, READ BACK BY AND VERIFIED WITH FABIENE BOWLER, RN AT 2128 07.07.25 JLASIGAN Performed at [facility], [address] [place], [place] [address]    WBC 05/21/2024 6.2  4.0 - 10.5 K/uL Final   RBC 05/21/2024 4.54  4.22 - 5.81 MIL/uL Final   Hemoglobin 05/21/2024 12.9 (L)  13.0 - 17.0 g/dL Final   HCT 92/91/7974 37.9 (L)  39.0 - 52.0 % Final   MCV 05/21/2024 83.5  80.0 - 100.0 fL Final   MCH 05/21/2024 28.4  26.0 - 34.0 pg Final   MCHC 05/21/2024 34.0  30.0 - 36.0 g/dL Final   RDW 92/91/7974 14.5  11.5 - 15.5 % Final   Platelets 05/21/2024 270  150 - 400 K/uL Final   nRBC 05/21/2024 0.0  0.0  - 0.2 % Final   Performed at [facility], [address], [place], [place] [address]   Lactic Acid, Venous 05/20/2024 2.4 (HH)  0.5 - 1.9 mmol/L Final   Comment: CRITICAL VALUE NOTED. VALUE IS CONSISTENT WITH PREVIOUSLY REPORTED/CALLED VALUE Performed at [facility], [address], [place], [place] [address]    Lactic Acid, Venous 05/21/2024 1.6  0.5 - 1.9 mmol/L Final   Performed at [facility], [address], [place], [place] [address]   Magnesium  05/21/2024 1.7  1.7 - 2.4 mg/dL Final   Performed at [facility], [address], [place], [place] [address]   WBC 05/22/2024 6.1  4.0 - 10.5 K/uL Final   RBC 05/22/2024 3.83 (L)  4.22 - 5.81 MIL/uL Final   Hemoglobin 05/22/2024 11.0 (L)  13.0 - 17.0 g/dL Final   HCT 92/90/7974 33.0 (L)  39.0 - 52.0 % Final   MCV 05/22/2024 86.2  80.0 - 100.0 fL Final   MCH 05/22/2024 28.7  26.0 - 34.0 pg Final   MCHC 05/22/2024 33.3  30.0 - 36.0 g/dL Final   RDW 92/90/7974 14.4  11.5 - 15.5 % Final   Platelets 05/22/2024 219  150 - 400 K/uL Final   nRBC 05/22/2024 0.0  0.0 - 0.2 % Final   Performed at [facility], [address], [place], [place] [address]   Sodium 05/22/2024 137  135 - 145 mmol/L Final   Potassium 05/22/2024 3.6  3.5 - 5.1 mmol/L Final   Chloride 05/22/2024 107  98 - 111 mmol/L Final   CO2 05/22/2024 24  22 - 32 mmol/L Final   Glucose, Bld 05/22/2024 100 (H)  70 - 99 mg/dL Final   Glucose reference range applies only to samples taken after fasting for at least 8 hours.   BUN 05/22/2024 5 (L)  6 - 20 mg/dL Final   Creatinine, Ser 05/22/2024 0.75  0.61 - 1.24 mg/dL Final   Calcium  05/22/2024 8.1 (L)  8.9 - 10.3 mg/dL Final   GFR, Estimated 05/22/2024 >60  >60 mL/min Final   Comment: (NOTE) Calculated using the CKD-EPI Creatinine Equation (2021)    Anion gap 05/22/2024 6  5 - 15 Final   Performed at [facility], [address], [place], [place] [address]   WBC 05/23/2024 6.3  4.0 - 10.5 K/uL  Final   RBC 05/23/2024 4.58  4.22 - 5.81 MIL/uL Final   Hemoglobin 05/23/2024 12.8 (L)  13.0 - 17.0 g/dL Final   HCT 92/89/7974 39.5  39.0 - 52.0 % Final   MCV 05/23/2024 86.2  80.0 - 100.0 fL Final   MCH 05/23/2024 27.9  26.0 - 34.0 pg Final   MCHC 05/23/2024 32.4  30.0 - 36.0 g/dL Final   RDW 92/89/7974 14.6  11.5 - 15.5 % Final   Platelets 05/23/2024 252  150 - 400 K/uL Final   nRBC 05/23/2024 0.0  0.0 - 0.2 % Final   Performed at [facility] Lab, [address], [place], [place] [address]   Sodium 05/23/2024 139  135 - 145 mmol/L Final   Potassium 05/23/2024 3.4 (L)  3.5 - 5.1 mmol/L Final   Chloride 05/23/2024 102  98 - 111 mmol/L Final   CO2 05/23/2024 22  22 - 32 mmol/L Final   Glucose, Bld 05/23/2024 92  70 - 99 mg/dL Final   Glucose reference range applies only to samples taken after fasting for at least 8 hours.   BUN 05/23/2024 <5 (L)  6 - 20 mg/dL Final   Creatinine, Ser 05/23/2024 0.64  0.61 - 1.24 mg/dL Final   Calcium  05/23/2024 9.7  8.9 - 10.3 mg/dL Final   GFR, Estimated 05/23/2024 >60  >60 mL/min Final   Comment: (NOTE) Calculated using the CKD-EPI Creatinine Equation (2021)    Anion gap 05/23/2024 15  5 - 15 Final   Performed at [facility], [address] [place], [place] [address]   Magnesium  05/23/2024 1.6 (L)  1.7 - 2.4 mg/dL Final   Performed at [facility], [address], [place], [place] [address]   WBC 05/24/2024 5.1  4.0 - 10.5  K/uL Final   RBC 05/24/2024 4.36  4.22 - 5.81 MIL/uL Final   Hemoglobin 05/24/2024 12.6 (L)  13.0 - 17.0 g/dL Final   HCT 92/88/7974 38.0 (L)  39.0 - 52.0 % Final   MCV 05/24/2024 87.2  80.0 - 100.0 fL Final   MCH 05/24/2024 28.9  26.0 - 34.0 pg Final   MCHC 05/24/2024 33.2  30.0 - 36.0 g/dL Final   RDW 92/88/7974 14.7  11.5 - 15.5 % Final   Platelets 05/24/2024 237  150 - 400 K/uL Final   nRBC 05/24/2024 0.0  0.0 - 0.2 % Final   Performed at [facility], [address] [place], [place] [address]    Sodium 05/24/2024 136  135 - 145 mmol/L Final   Potassium 05/24/2024 3.6  3.5 - 5.1 mmol/L Final   Chloride 05/24/2024 103  98 - 111 mmol/L Final   CO2 05/24/2024 22  22 - 32 mmol/L Final   Glucose, Bld 05/24/2024 100 (H)  70 - 99 mg/dL Final   Glucose reference range applies only to samples taken after fasting for at least 8 hours.   BUN 05/24/2024 6  6 - 20 mg/dL Final   Creatinine, Ser 05/24/2024 0.61  0.61 - 1.24 mg/dL Final   Calcium  05/24/2024 9.4  8.9 - 10.3 mg/dL Final   Phosphorus 92/88/7974 4.9 (H)  2.5 - 4.6 mg/dL Final   Albumin 92/88/7974 3.3 (L)  3.5 - 5.0 g/dL Final   GFR, Estimated 05/24/2024 >60  >60 mL/min Final   Comment: (NOTE) Calculated using the CKD-EPI Creatinine Equation (2021)    Anion gap 05/24/2024 11  5 - 15 Final   Performed at [facility], [address] [place], [place] [address]  Admission on 05/08/2024, Discharged on 05/10/2024  Component Date Value Ref Range Status   Glucose-Capillary 05/08/2024 105 (H)  70 - 99 mg/dL Final   Glucose reference range applies only to samples taken after fasting for at least 8 hours.   Troponin I (High Sensitivity) 05/08/2024 6  <18 ng/L Final   Comment: (NOTE) Elevated high sensitivity troponin I (hsTnI) values and significant  changes across serial measurements may suggest ACS but many other  chronic and acute conditions are known to elevate hsTnI results.  Refer to the Links section for chest pain algorithms and additional  guidance. Performed at [facility], [address], [place], [place] [address]    WBC 05/08/2024 13.3 (H)  4.0 - 10.5 K/uL Final   RBC 05/08/2024 5.23  4.22 - 5.81 MIL/uL Final   Hemoglobin 05/08/2024 14.8  13.0 - 17.0 g/dL Final   HCT 93/74/7974 46.3  39.0 - 52.0 % Final   MCV 05/08/2024 88.5  80.0 - 100.0 fL Final   MCH 05/08/2024 28.3  26.0 - 34.0 pg Final   MCHC 05/08/2024 32.0  30.0 - 36.0 g/dL Final   RDW 93/74/7974 14.4  11.5 - 15.5 % Final   Platelets 05/08/2024 335   150 - 400 K/uL Final   nRBC 05/08/2024 0.0  0.0 - 0.2 % Final   Performed at [facility], [address], [place], [place] [address]   Sodium 05/08/2024 142  135 - 145 mmol/L Final   Potassium 05/08/2024 4.2  3.5 - 5.1 mmol/L Final   Chloride 05/08/2024 103  98 - 111 mmol/L Final   CO2 05/08/2024 10 (L)  22 - 32 mmol/L Final   Glucose, Bld 05/08/2024 99  70 - 99 mg/dL  Final   Glucose reference range applies only to samples taken after fasting for at least 8 hours.   BUN 05/08/2024 8  6 - 20 mg/dL Final   Creatinine, Ser 05/08/2024 1.31 (H)  0.61 - 1.24 mg/dL Final   Calcium  05/08/2024 10.3  8.9 - 10.3 mg/dL Final   Total Protein 93/74/7974 9.4 (H)  6.5 - 8.1 g/dL Final   Albumin 93/74/7974 4.8  3.5 - 5.0 g/dL Final   AST 93/74/7974 35  15 - 41 U/L Final   ALT 05/08/2024 15  0 - 44 U/L Final   Alkaline Phosphatase 05/08/2024 109  38 - 126 U/L Final   Total Bilirubin 05/08/2024 2.4 (H)  0.0 - 1.2 mg/dL Final   GFR, Estimated 05/08/2024 >60  >60 mL/min Final   Comment: (NOTE) Calculated using the CKD-EPI Creatinine Equation (2021)    Anion gap 05/08/2024 29 (H)  5 - 15 Final   Comment: ELECTROLYTES REPEATED TO VERIFY Performed at [facility], [address] [place], [place] [address]    Troponin I (High Sensitivity) 05/08/2024 7  <18 ng/L Final   Comment: (NOTE) Elevated high sensitivity troponin I (hsTnI) values and significant  changes across serial measurements may suggest ACS but many other  chronic and acute conditions are known to elevate hsTnI results.  Refer to the Links section for chest pain algorithms and additional  guidance. Performed at [facility], [address] [place], [place] [address]    Lipase 05/08/2024 34  11 - 51 U/L Final   Performed at [facility], [address], [place], [place] [address]   Alcohol, Ethyl (B) 05/09/2024 <15  <15 mg/dL Final   Comment: (NOTE) For medical purposes only. Performed at [facility], [address], [place], [place] [address]    Lactic Acid, Venous 05/09/2024 1.2  0.5 - 1.9 mmol/L Final   Performed at [facility], [address] [place], [place] [address]   Color, Urine 05/09/2024 YELLOW  YELLOW Final   APPearance 05/09/2024 CLEAR  CLEAR Final   Specific Gravity, Urine 05/09/2024 1.039 (H)  1.005 - 1.030 Final   pH 05/09/2024 5.0  5.0 - 8.0 Final   Glucose, UA 05/09/2024 NEGATIVE  NEGATIVE mg/dL Final   Hgb urine dipstick 05/09/2024 MODERATE (A)  NEGATIVE Final   Bilirubin Urine 05/09/2024 NEGATIVE  NEGATIVE Final   Ketones, ur 05/09/2024 80 (A)  NEGATIVE mg/dL Final   Protein, ur 93/73/7974 >=300 (A)  NEGATIVE mg/dL Final   Nitrite 93/73/7974 NEGATIVE  NEGATIVE Final   Leukocytes,Ua 05/09/2024 NEGATIVE  NEGATIVE Final   RBC / HPF 05/09/2024 6-10  0 - 5 RBC/hpf Final   WBC, UA 05/09/2024 0-5  0 - 5 WBC/hpf Final   Bacteria, UA 05/09/2024 NONE SEEN  NONE SEEN Final   Squamous Epithelial / HPF 05/09/2024 6-10  0 - 5 /HPF Final   Mucus 05/09/2024 PRESENT   Final   Performed at [facility], [address], [place], [place] [address]   Sodium 05/09/2024 132 (L)  135 - 145 mmol/L Final   DELTA CHECK NOTED   Potassium 05/09/2024 3.6  3.5 - 5.1 mmol/L Final   Chloride 05/09/2024 102  98 - 111 mmol/L Final   CO2 05/09/2024 21 (L)  22 - 32 mmol/L Final   Glucose, Bld 05/09/2024 88  70 - 99 mg/dL Final   Glucose reference range applies only to samples taken after fasting for at  least 8 hours.   BUN 05/09/2024 6  6 - 20 mg/dL Final   Creatinine, Ser 05/09/2024 0.74  0.61 - 1.24 mg/dL Final   Calcium  05/09/2024 9.5  8.9 - 10.3 mg/dL Final   GFR, Estimated 05/09/2024 >60  >60 mL/min Final   Comment: (NOTE) Calculated using the CKD-EPI Creatinine Equation (2021)    Anion gap 05/09/2024 9  5 - 15 Final   Performed at [facility], [address], [place], [place] [address]   Total Protein 05/09/2024 7.2  6.5 - 8.1 g/dL Final   Albumin 93/73/7974 3.8   3.5 - 5.0 g/dL Final   AST 93/73/7974 32  15 - 41 U/L Final   ALT 05/09/2024 15  0 - 44 U/L Final   Alkaline Phosphatase 05/09/2024 92  38 - 126 U/L Final   Total Bilirubin 05/09/2024 1.8 (H)  0.0 - 1.2 mg/dL Final   Bilirubin, Direct 05/09/2024 0.4 (H)  0.0 - 0.2 mg/dL Final   Indirect Bilirubin 05/09/2024 1.4 (H)  0.3 - 0.9 mg/dL Final   Performed at [facility], [address] [place], [place] [address]   pH, Ven 05/09/2024 7.43  7.25 - 7.43 Final   pCO2, Ven 05/09/2024 32 (L)  44 - 60 mmHg Final   pO2, Ven 05/09/2024 50 (H)  32 - 45 mmHg Final   Bicarbonate 05/09/2024 21.2  20.0 - 28.0 mmol/L Final   Acid-base deficit 05/09/2024 2.3 (H)  0.0 - 2.0 mmol/L Final   O2 Saturation 05/09/2024 80.6  % Final   Patient temperature 05/09/2024 37.0   Final   Performed at [facility], [address] [place] [address], [place], [place] [address]   WBC 05/09/2024 7.9  4.0 - 10.5 K/uL Final   RBC 05/09/2024 4.75  4.22 - 5.81 MIL/uL Final   Hemoglobin 05/09/2024 13.5  13.0 - 17.0 g/dL Final   HCT 93/73/7974 40.5  39.0 - 52.0 % Final   MCV 05/09/2024 85.3  80.0 - 100.0 fL Final   MCH 05/09/2024 28.4  26.0 - 34.0 pg Final   MCHC 05/09/2024 33.3  30.0 - 36.0 g/dL Final   RDW 93/73/7974 14.2  11.5 - 15.5 % Final   Platelets 05/09/2024 260  150 - 400 K/uL Final   nRBC 05/09/2024 0.0  0.0 - 0.2 % Final   Performed at [facility], [address], [place], [place] [address]  Admission on 04/03/2024, Discharged on 04/12/2024  Component Date Value Ref Range Status   Sodium 04/04/2024 135  135 - 145 mmol/L Final   Potassium 04/04/2024 3.8  3.5 - 5.1 mmol/L Final   Chloride 04/04/2024 102  98 - 111 mmol/L Final   CO2 04/04/2024 22  22 - 32 mmol/L Final   Glucose, Bld 04/04/2024 109 (H)  70 - 99 mg/dL Final   Glucose reference range applies only to samples taken after fasting for at least 8 hours.   BUN 04/04/2024 6  6 - 20 mg/dL Final   Creatinine, Ser 04/04/2024 0.74  0.61 - 1.24 mg/dL Final    Calcium  04/04/2024 9.1  8.9 - 10.3 mg/dL Final   GFR, Estimated 04/04/2024 >60  >60 mL/min Final   Comment: (NOTE) Calculated using the CKD-EPI Creatinine Equation (2021)    Anion gap 04/04/2024 11  5 - 15 Final   Performed at [facility], [address], [place], [place] [address]   Cholesterol 04/04/2024 169  0 - 200 mg/dL Final  Triglycerides 04/04/2024 195 (H)  <150 mg/dL Final   HDL 94/77/7974 56  >40 mg/dL Final   Total CHOL/HDL Ratio 04/04/2024 3.0  RATIO Final   VLDL 04/04/2024 39  0 - 40 mg/dL Final   LDL Cholesterol 04/04/2024 74  0 - 99 mg/dL Final   Comment:        Total Cholesterol/HDL:CHD Risk Coronary Heart Disease Risk Table                     Men   Women  1/2 Average Risk   3.4   3.3  Average Risk       5.0   4.4  2 X Average Risk   9.6   7.1  3 X Average Risk  23.4   11.0        Use the calculated Patient Ratio above and the CHD Risk Table to determine the patient's CHD Risk.        ATP III CLASSIFICATION (LDL):  <100     mg/dL   Optimal  899-870  mg/dL   Near or Above                    Optimal  130-159  mg/dL   Borderline  839-810  mg/dL   High  >809     mg/dL   Very High Performed at [facility], [address], [place], [place] [address]   Admission on 04/02/2024, Discharged on 04/03/2024  Component Date Value Ref Range Status   Sodium 04/02/2024 137  135 - 145 mmol/L Final   Potassium 04/02/2024 3.3 (L)  3.5 - 5.1 mmol/L Final   Chloride 04/02/2024 102  98 - 111 mmol/L Final   CO2 04/02/2024 23  22 - 32 mmol/L Final   Glucose, Bld 04/02/2024 90  70 - 99 mg/dL Final   Glucose reference range applies only to samples taken after fasting for at least 8 hours.   BUN 04/02/2024 <5 (L)  6 - 20 mg/dL Final   Creatinine, Ser 04/02/2024 0.77  0.61 - 1.24 mg/dL Final   Calcium  04/02/2024 9.6  8.9 - 10.3 mg/dL Final   Total Protein 94/79/7974 8.1  6.5 - 8.1 g/dL Final   Albumin 94/79/7974 3.8  3.5 - 5.0 g/dL Final    AST 94/79/7974 38  15 - 41 U/L Final   ALT 04/02/2024 23  0 - 44 U/L Final   Alkaline Phosphatase 04/02/2024 116  38 - 126 U/L Final   Total Bilirubin 04/02/2024 0.8  0.0 - 1.2 mg/dL Final   GFR, Estimated 04/02/2024 >60  >60 mL/min Final   Comment: (NOTE) Calculated using the CKD-EPI Creatinine Equation (2021)    Anion gap 04/02/2024 12  5 - 15 Final   Performed at [facility], [address], [place], [place] [address]   Alcohol, Ethyl (B) 04/02/2024 257 (H)  <15 mg/dL Final   Comment: Please note change in reference range. (NOTE) For medical purposes only. Performed at [name], [address] [place], [place] [address]    Opiates 04/02/2024 NONE DETECTED  NONE DETECTED Final   Cocaine 04/02/2024 NONE DETECTED  NONE DETECTED Final   Benzodiazepines 04/02/2024 POSITIVE (A)  NONE DETECTED Final   Amphetamines 04/02/2024 NONE DETECTED  NONE DETECTED Final   Tetrahydrocannabinol 04/02/2024 NONE DETECTED  NONE DETECTED Final   Barbiturates 04/02/2024 NONE DETECTED  NONE DETECTED Final   Comment: (NOTE) DRUG SCREEN FOR MEDICAL PURPOSES ONLY.  IF CONFIRMATION IS NEEDED FOR ANY PURPOSE, NOTIFY LAB WITHIN 5 DAYS.  LOWEST DETECTABLE LIMITS FOR URINE DRUG SCREEN Drug Class                     Cutoff (ng/mL) Amphetamine and metabolites    1000 Barbiturate and metabolites    200 Benzodiazepine                 200 Opiates and metabolites        300 Cocaine and metabolites        300 THC                            50 Performed at [facility], [address] [place], [place] [address]    WBC 04/02/2024 6.3  4.0 - 10.5 K/uL Final   RBC 04/02/2024 4.44  4.22 - 5.81 MIL/uL Final   Hemoglobin 04/02/2024 12.7 (L)  13.0 - 17.0 g/dL Final   HCT 94/79/7974 39.1  39.0 - 52.0 % Final   MCV 04/02/2024 88.1  80.0 - 100.0 fL Final   MCH 04/02/2024 28.6  26.0 - 34.0 pg Final   MCHC 04/02/2024 32.5  30.0 - 36.0 g/dL Final   RDW  94/79/7974 14.5  11.5 - 15.5 % Final   Platelets 04/02/2024 448 (H)  150 - 400 K/uL Final   nRBC 04/02/2024 0.0  0.0 - 0.2 % Final   Neutrophils Relative % 04/02/2024 63  % Final   Neutro Abs 04/02/2024 3.9  1.7 - 7.7 K/uL Final   Lymphocytes Relative 04/02/2024 25  % Final   Lymphs Abs 04/02/2024 1.6  0.7 - 4.0 K/uL Final   Monocytes Relative 04/02/2024 10  % Final   Monocytes Absolute 04/02/2024 0.7  0.1 - 1.0 K/uL Final   Eosinophils Relative 04/02/2024 1  % Final   Eosinophils Absolute 04/02/2024 0.1  0.0 - 0.5 K/uL Final   Basophils Relative 04/02/2024 1  % Final   Basophils Absolute 04/02/2024 0.1  0.0 - 0.1 K/uL Final   Immature Granulocytes 04/02/2024 0  % Final   Abs Immature Granulocytes 04/02/2024 0.02  0.00 - 0.07 K/uL Final   Performed at [facility], [address] [place], [place] [address]  No results displayed because visit has over 200 results.    Admission on 03/15/2024, Discharged on 03/16/2024  Component Date Value Ref Range Status   Glucose-Capillary 03/15/2024 97  70 - 99 mg/dL Final   Glucose reference range applies only to samples taken after fasting for at least 8 hours.   Sodium 03/15/2024 142  135 - 145 mmol/L Final   Potassium 03/15/2024 3.3 (L)  3.5 - 5.1 mmol/L Final   Chloride 03/15/2024 101  98 - 111 mmol/L Final   CO2 03/15/2024 19 (L)  22 - 32 mmol/L Final   Glucose, Bld 03/15/2024 90  70 - 99 mg/dL Final   Glucose reference range applies only to samples taken after fasting for at least 8 hours.   BUN 03/15/2024 <5 (L)  6 - 20 mg/dL Final   QA FLAGS AND/OR RANGES MODIFIED BY DEMOGRAPHIC UPDATE ON 05/02 AT 1931   Creatinine, Ser 03/15/2024 0.79  0.61 - 1.24 mg/dL Final   Calcium  03/15/2024 8.6 (L)  8.9 - 10.3 mg/dL Final   Total Protein 94/97/7974 8.0  6.5 - 8.1 g/dL Final   Albumin 94/97/7974 4.0  3.5 - 5.0 g/dL Final  AST 03/15/2024 55 (H)  15 - 41 U/L Final   ALT 03/15/2024 21  0 - 44 U/L Final   Alkaline Phosphatase  03/15/2024 92  38 - 126 U/L Final   Total Bilirubin 03/15/2024 0.7  0.0 - 1.2 mg/dL Final   GFR, Estimated 03/15/2024 59 (L)  >60 mL/min Final   Comment: (NOTE) Calculated using the CKD-EPI Creatinine Equation (2021)    Anion gap 03/15/2024 22 (H)  5 - 15 Final   Comment: ELECTROLYTES REPEATED TO VERIFY Performed at [facility], [address] [place], [place] [address]    WBC 03/15/2024 9.4  4.0 - 10.5 K/uL Final   RBC 03/15/2024 4.64  4.22 - 5.81 MIL/uL Final   Hemoglobin 03/15/2024 13.3  13.0 - 17.0 g/dL Final   HCT 94/97/7974 41.4  39.0 - 52.0 % Final   MCV 03/15/2024 89.2  80.0 - 100.0 fL Final   MCH 03/15/2024 28.7  26.0 - 34.0 pg Final   MCHC 03/15/2024 32.1  30.0 - 36.0 g/dL Final   RDW 94/97/7974 14.0  11.5 - 15.5 % Final   Platelets 03/15/2024 302  150 - 400 K/uL Final   nRBC 03/15/2024 0.0  0.0 - 0.2 % Final   Neutrophils Relative % 03/15/2024 77  % Final   Neutro Abs 03/15/2024 7.3  1.7 - 7.7 K/uL Final   Lymphocytes Relative 03/15/2024 14  % Final   Lymphs Abs 03/15/2024 1.3  0.7 - 4.0 K/uL Final   Monocytes Relative 03/15/2024 7  % Final   Monocytes Absolute 03/15/2024 0.7  0.1 - 1.0 K/uL Final   Eosinophils Relative 03/15/2024 0  % Final   Eosinophils Absolute 03/15/2024 0.0  0.0 - 0.5 K/uL Final   Basophils Relative 03/15/2024 1  % Final   Basophils Absolute 03/15/2024 0.1  0.0 - 0.1 K/uL Final   Immature Granulocytes 03/15/2024 1  % Final   Abs Immature Granulocytes 03/15/2024 0.05  0.00 - 0.07 K/uL Final   Performed at [facility], [address], [place], [place] [address]   Ammonia 03/15/2024 36 (H)  9 - 35 umol/L Final   Performed at [facility], [address], [place], [place] [address]   Sodium 03/15/2024 142  135 - 145 mmol/L Final   Potassium 03/15/2024 3.1 (L)  3.5 - 5.1 mmol/L Final   Chloride 03/15/2024 102  98 - 111 mmol/L Final   BUN 03/15/2024 <3 (L)  6 - 20 mg/dL Final   QA FLAGS AND/OR RANGES MODIFIED BY DEMOGRAPHIC UPDATE  ON 05/02 AT 1931   Creatinine, Ser 03/15/2024 1.30 (H)  0.61 - 1.24 mg/dL Final   Glucose, Bld 94/97/7974 93  70 - 99 mg/dL Final   Glucose reference range applies only to samples taken after fasting for at least 8 hours.   Calcium , Ion 03/15/2024 0.89 (LL)  1.15 - 1.40 mmol/L Final   TCO2 03/15/2024 20 (L)  22 - 32 mmol/L Final   Hemoglobin 03/15/2024 15.0  13.0 - 17.0 g/dL Final   HCT 94/97/7974 44.0  39.0 - 52.0 % Final   Comment 03/15/2024 NOTIFIED PHYSICIAN   Final   Opiates 03/15/2024 NONE DETECTED  NONE DETECTED Final   Cocaine 03/15/2024 NONE DETECTED  NONE DETECTED Final   Benzodiazepines 03/15/2024 POSITIVE (A)  NONE DETECTED Final   Amphetamines 03/15/2024 NONE DETECTED  NONE DETECTED Final   Tetrahydrocannabinol 03/15/2024 NONE DETECTED  NONE DETECTED Final   Barbiturates 03/15/2024 NONE DETECTED  NONE DETECTED  Final   Comment: (NOTE) DRUG SCREEN FOR MEDICAL PURPOSES ONLY.  IF CONFIRMATION IS NEEDED FOR ANY PURPOSE, NOTIFY LAB WITHIN 5 DAYS.  LOWEST DETECTABLE LIMITS FOR URINE DRUG SCREEN Drug Class                     Cutoff (ng/mL) Amphetamine and metabolites    1000 Barbiturate and metabolites    200 Benzodiazepine                 200 Opiates and metabolites        300 Cocaine and metabolites        300 THC                            50 Performed at [facility], [address], [place], [place] [address]    Alcohol, Ethyl (B) 03/15/2024 495 (HH)  <15 mg/dL Final   Comment: CRITICAL RESULT CALLED TO, READ BACK BY AND VERIFIED WITH C WELLS RN 03/15/2024 1823 BNUNNERY Please note change in reference range. (NOTE) For medical purposes only. Performed at [facility], [address] [place], [place] [address]   Admission on 01/25/2024, Discharged on 02/03/2024  Component Date Value Ref Range Status   Folate 01/29/2024 22.8  >5.9 ng/mL Final   Performed at [facility], [address] [place], [place] [address]   Hgb A1c MFr Bld  01/29/2024 4.5 (L)  4.8 - 5.6 % Final   Comment: (NOTE) Pre diabetes:          5.7%-6.4%  Diabetes:              >6.4%  Glycemic control for   <7.0% adults with diabetes    Mean Plasma Glucose 01/29/2024 82.45  mg/dL Final   Performed at [facility], [address] [place], [place] [address]   Cholesterol 01/29/2024 174  0 - 200 mg/dL Final   Triglycerides 96/82/7974 165 (H)  <150 mg/dL Final   HDL 96/82/7974 50  >40 mg/dL Final   Total CHOL/HDL Ratio 01/29/2024 3.5  RATIO Final   VLDL 01/29/2024 33  0 - 40 mg/dL Final   LDL Cholesterol 01/29/2024 91  0 - 99 mg/dL Final   Comment:        Total Cholesterol/HDL:CHD Risk Coronary Heart Disease Risk Table                     Men   Women  1/2 Average Risk   3.4   3.3  Average Risk       5.0   4.4  2 X Average Risk   9.6   7.1  3 X Average Risk  23.4   11.0        Use the calculated Patient Ratio above and the CHD Risk Table to determine the patient's CHD Risk.        ATP III CLASSIFICATION (LDL):  <100     mg/dL   Optimal  899-870  mg/dL   Near or Above                    Optimal  130-159  mg/dL   Borderline  839-810  mg/dL   High  >809     mg/dL   Very High Performed at [facility], [address], [place], [place] [address]    RPR Ser Ql 01/29/2024 NON  REACTIVE  NON REACTIVE Final   Performed at [facility], [address], [place], [place] [address]   TSH 01/29/2024 4.117  0.350 - 4.500 uIU/mL Final   Comment: Performed by a 3rd Generation assay with a functional sensitivity of <=0.01 uIU/mL. Performed at [facility], [address], [place], [place] [address]    Vitamin B-12 01/29/2024 585  180 - 914 pg/mL Final   Comment: (NOTE) This assay is not validated for testing neonatal or myeloproliferative syndrome specimens for Vitamin B12 levels. Performed at [facility], [address], [place], [place] [address]    Vit D, 25-Hydroxy 01/29/2024  29.42 (L)  30 - 100 ng/mL Final   Comment: (NOTE) Vitamin D  deficiency has been defined by the Institute of Medicine  and an Endocrine Society practice guideline as a level of serum 25-OH  vitamin D  less than 20 ng/mL (1,2). The Endocrine Society went on to  further define vitamin D  insufficiency as a level between 21 and 29  ng/mL (2).  1. IOM (Institute of Medicine). 2010. Dietary reference intakes for  calcium  and D. [place]: [employer]. 2. Holick MF, Binkley Acequia, Bischoff-Ferrari HA, et al. Evaluation,  treatment, and prevention of vitamin D  deficiency: an Endocrine  Society clinical practice guideline, JCEM. 2011 Jul; 96(7): 1911-30.  Performed at [facility], [address] [place], [place] [address]    Free T4 02/01/2024 0.74  0.61 - 1.12 ng/dL Final   Comment: (NOTE) Biotin ingestion may interfere with free T4 tests. If the results are inconsistent with the TSH level, previous test results, or the clinical presentation, then consider biotin interference. If needed, order repeat testing after stopping biotin. Performed at [facility], [address], [place], [place] [address]   Admission on 01/23/2024, Discharged on 01/25/2024  Component Date Value Ref Range Status   Glucose-Capillary 01/23/2024 111 (H)  70 - 99 mg/dL Final   Glucose reference range applies only to samples taken after fasting for at least 8 hours.   Sodium 01/23/2024 133 (L)  135 - 145 mmol/L Final   Potassium 01/23/2024 4.3  3.5 - 5.1 mmol/L Final   Chloride 01/23/2024 102  98 - 111 mmol/L Final   CO2 01/23/2024 23  22 - 32 mmol/L Final   Glucose, Bld 01/23/2024 100 (H)  70 - 99 mg/dL Final   Glucose reference range applies only to samples taken after fasting for at least 8 hours.   BUN 01/23/2024 8  6 - 20 mg/dL Final   Creatinine, Ser 01/23/2024 0.92  0.61 - 1.24 mg/dL Final   Calcium  01/23/2024 9.4  8.9 - 10.3 mg/dL Final   Total Protein 96/88/7974 7.4  6.5 -  8.1 g/dL Final   Albumin 96/88/7974 3.6  3.5 - 5.0 g/dL Final   AST 96/88/7974 125 (H)  15 - 41 U/L Final   ALT 01/23/2024 57 (H)  0 - 44 U/L Final   Alkaline Phosphatase 01/23/2024 85  38 - 126 U/L Final   Total Bilirubin 01/23/2024 0.7  0.0 - 1.2 mg/dL Final   GFR, Estimated 01/23/2024 >60  >60 mL/min Final   Comment: (NOTE) Calculated using the CKD-EPI Creatinine Equation (2021)    Anion gap 01/23/2024 8  5 - 15 Final   Performed at [facility], [address] [place], [place] [address]   WBC 01/23/2024 5.2  4.0 - 10.5 K/uL Final   RBC 01/23/2024 3.69 (  L)  4.22 - 5.81 MIL/uL Final   Hemoglobin 01/23/2024 11.7 (L)  13.0 - 17.0 g/dL Final   HCT 96/88/7974 35.7 (L)  39.0 - 52.0 % Final   MCV 01/23/2024 96.7  80.0 - 100.0 fL Final   MCH 01/23/2024 31.7  26.0 - 34.0 pg Final   MCHC 01/23/2024 32.8  30.0 - 36.0 g/dL Final   RDW 96/88/7974 13.8  11.5 - 15.5 % Final   Platelets 01/23/2024 291  150 - 400 K/uL Final   nRBC 01/23/2024 0.0  0.0 - 0.2 % Final   Neutrophils Relative % 01/23/2024 65  % Final   Neutro Abs 01/23/2024 3.3  1.7 - 7.7 K/uL Final   Lymphocytes Relative 01/23/2024 16  % Final   Lymphs Abs 01/23/2024 0.8  0.7 - 4.0 K/uL Final   Monocytes Relative 01/23/2024 15  % Final   Monocytes Absolute 01/23/2024 0.8  0.1 - 1.0 K/uL Final   Eosinophils Relative 01/23/2024 3  % Final   Eosinophils Absolute 01/23/2024 0.2  0.0 - 0.5 K/uL Final   Basophils Relative 01/23/2024 1  % Final   Basophils Absolute 01/23/2024 0.1  0.0 - 0.1 K/uL Final   Immature Granulocytes 01/23/2024 0  % Final   Abs Immature Granulocytes 01/23/2024 0.02  0.00 - 0.07 K/uL Final   Performed at [facility], [address] [place], [place] [address]   Magnesium  01/23/2024 2.1  1.7 - 2.4 mg/dL Final   Performed at [facility], [address], [place], [place] [address]   Alcohol, Ethyl (B) 01/23/2024 <10  <10 mg/dL Final   Comment: (NOTE) Lowest detectable limit for serum alcohol is  10 mg/dL.  For medical purposes only. Performed at [facility], [address] [place], [place] [address]    Opiates 01/23/2024 NONE DETECTED  NONE DETECTED Final   Cocaine 01/23/2024 NONE DETECTED  NONE DETECTED Final   Benzodiazepines 01/23/2024 POSITIVE (A)  NONE DETECTED Final   Amphetamines 01/23/2024 NONE DETECTED  NONE DETECTED Final   Tetrahydrocannabinol 01/23/2024 NONE DETECTED  NONE DETECTED Final   Barbiturates 01/23/2024 NONE DETECTED  NONE DETECTED Final   Comment: (NOTE) DRUG SCREEN FOR MEDICAL PURPOSES ONLY.  IF CONFIRMATION IS NEEDED FOR ANY PURPOSE, NOTIFY LAB WITHIN 5 DAYS.  LOWEST DETECTABLE LIMITS FOR URINE DRUG SCREEN Drug Class                     Cutoff (ng/mL) Amphetamine and metabolites    1000 Barbiturate and metabolites    200 Benzodiazepine                 200 Opiates and metabolites        300 Cocaine and metabolites        300 THC                            50 Performed at [facility], [address], [place], [place] [address]    Color, Urine 01/23/2024 YELLOW  YELLOW Final   APPearance 01/23/2024 CLEAR  CLEAR Final   Specific Gravity, Urine 01/23/2024 1.011  1.005 - 1.030 Final   pH 01/23/2024 6.0  5.0 - 8.0 Final   Glucose, UA 01/23/2024 NEGATIVE  NEGATIVE mg/dL Final   Hgb urine dipstick 01/23/2024 NEGATIVE  NEGATIVE Final   Bilirubin Urine 01/23/2024 NEGATIVE  NEGATIVE Final   Ketones, ur 01/23/2024 NEGATIVE  NEGATIVE mg/dL Final  Protein, ur 01/23/2024 NEGATIVE  NEGATIVE mg/dL Final   Nitrite 96/88/7974 NEGATIVE  NEGATIVE Final   Leukocytes,Ua 01/23/2024 NEGATIVE  NEGATIVE Final   Performed at [facility], [address], [place], [place] [address]   Glucose-Capillary 01/23/2024 87  70 - 99 mg/dL Final   Glucose reference range applies only to samples taken after fasting for at least 8 hours.   Sodium 01/23/2024 133 (L)  135 - 145 mmol/L Final   Potassium 01/23/2024 4.3  3.5 - 5.1 mmol/L Final   Chloride  01/23/2024 101  98 - 111 mmol/L Final   BUN 01/23/2024 9  6 - 20 mg/dL Final   Creatinine, Ser 01/23/2024 0.90  0.61 - 1.24 mg/dL Final   Glucose, Bld 96/88/7974 93  70 - 99 mg/dL Final   Glucose reference range applies only to samples taken after fasting for at least 8 hours.   Calcium , Ion 01/23/2024 1.05 (L)  1.15 - 1.40 mmol/L Final   TCO2 01/23/2024 25  22 - 32 mmol/L Final   Hemoglobin 01/23/2024 12.2 (L)  13.0 - 17.0 g/dL Final   HCT 96/88/7974 36.0 (L)  39.0 - 52.0 % Final   HIV Screen 4th Generation wRfx 01/24/2024 Non Reactive  Non Reactive Final   Performed at [facility], [address], [place], [place] [address]   WBC 01/24/2024 4.5  4.0 - 10.5 K/uL Final   RBC 01/24/2024 3.75 (L)  4.22 - 5.81 MIL/uL Final   Hemoglobin 01/24/2024 11.8 (L)  13.0 - 17.0 g/dL Final   HCT 96/87/7974 35.2 (L)  39.0 - 52.0 % Final   MCV 01/24/2024 93.9  80.0 - 100.0 fL Final   MCH 01/24/2024 31.5  26.0 - 34.0 pg Final   MCHC 01/24/2024 33.5  30.0 - 36.0 g/dL Final   RDW 96/87/7974 13.8  11.5 - 15.5 % Final   Platelets 01/24/2024 301  150 - 400 K/uL Final   nRBC 01/24/2024 0.0  0.0 - 0.2 % Final   Performed at [facility], [address] [place], [place] [address]   Sodium 01/24/2024 135  135 - 145 mmol/L Final   Potassium 01/24/2024 3.9  3.5 - 5.1 mmol/L Final   Chloride 01/24/2024 104  98 - 111 mmol/L Final   CO2 01/24/2024 22  22 - 32 mmol/L Final   Glucose, Bld 01/24/2024 96  70 - 99 mg/dL Final   Glucose reference range applies only to samples taken after fasting for at least 8 hours.   BUN 01/24/2024 8  6 - 20 mg/dL Final   Creatinine, Ser 01/24/2024 0.96  0.61 - 1.24 mg/dL Final   Calcium  01/24/2024 9.5  8.9 - 10.3 mg/dL Final   Total Protein 96/87/7974 7.5  6.5 - 8.1 g/dL Final   Albumin 96/87/7974 3.6  3.5 - 5.0 g/dL Final   AST 96/87/7974 86 (H)  15 - 41 U/L Final   ALT 01/24/2024 51 (H)  0 - 44 U/L Final   Alkaline Phosphatase 01/24/2024 81  38 - 126 U/L Final    Total Bilirubin 01/24/2024 0.7  0.0 - 1.2 mg/dL Final   GFR, Estimated 01/24/2024 >60  >60 mL/min Final   Comment: (NOTE) Calculated using the CKD-EPI Creatinine Equation (2021)    Anion gap 01/24/2024 9  5 - 15 Final   Performed at [facility], [address], [place], [place] [address]   WBC 01/25/2024 4.6  4.0 - 10.5 K/uL Final   RBC  01/25/2024 3.72 (L)  4.22 - 5.81 MIL/uL Final   Hemoglobin 01/25/2024 11.7 (L)  13.0 - 17.0 g/dL Final   HCT 96/86/7974 35.1 (L)  39.0 - 52.0 % Final   MCV 01/25/2024 94.4  80.0 - 100.0 fL Final   MCH 01/25/2024 31.5  26.0 - 34.0 pg Final   MCHC 01/25/2024 33.3  30.0 - 36.0 g/dL Final   RDW 96/86/7974 13.8  11.5 - 15.5 % Final   Platelets 01/25/2024 292  150 - 400 K/uL Final   nRBC 01/25/2024 0.0  0.0 - 0.2 % Final   Performed at [facility], [address], [place], [place] [address]   Sodium 01/25/2024 138  135 - 145 mmol/L Final   Potassium 01/25/2024 4.3  3.5 - 5.1 mmol/L Final   Chloride 01/25/2024 104  98 - 111 mmol/L Final   CO2 01/25/2024 24  22 - 32 mmol/L Final   Glucose, Bld 01/25/2024 93  70 - 99 mg/dL Final   Glucose reference range applies only to samples taken after fasting for at least 8 hours.   BUN 01/25/2024 8  6 - 20 mg/dL Final   Creatinine, Ser 01/25/2024 0.79  0.61 - 1.24 mg/dL Final   Calcium  01/25/2024 9.2  8.9 - 10.3 mg/dL Final   GFR, Estimated 01/25/2024 >60  >60 mL/min Final   Comment: (NOTE) Calculated using the CKD-EPI Creatinine Equation (2021)    Anion gap 01/25/2024 10  5 - 15 Final   Performed at [facility], [address], [place], [place] [address]  Admission on 01/18/2024, Discharged on 01/23/2024  Component Date Value Ref Range Status   POC Amphetamine UR 01/18/2024 None Detected  NONE DETECTED (Cut Off Level 1000 ng/mL) Final   POC Secobarbital (BAR) 01/18/2024 None Detected  NONE DETECTED (Cut Off Level 300 ng/mL) Final   POC Buprenorphine (BUP) 01/18/2024 None Detected  NONE DETECTED  (Cut Off Level 10 ng/mL) Final   POC Oxazepam (BZO) 01/18/2024 Positive (A)  NONE DETECTED (Cut Off Level 300 ng/mL) Final   POC Cocaine UR 01/18/2024 None Detected  NONE DETECTED (Cut Off Level 300 ng/mL) Final   POC Methamphetamine UR 01/18/2024 None Detected  NONE DETECTED (Cut Off Level 1000 ng/mL) Final   POC Morphine  01/18/2024 None Detected  NONE DETECTED (Cut Off Level 300 ng/mL) Final   POC Methadone UR 01/18/2024 None Detected  NONE DETECTED (Cut Off Level 300 ng/mL) Final   POC Oxycodone  UR 01/18/2024 None Detected  NONE DETECTED (Cut Off Level 100 ng/mL) Final   POC Marijuana UR 01/18/2024 Positive (A)  NONE DETECTED (Cut Off Level 50 ng/mL) Final   Alcohol, Ethyl (B) 01/18/2024 <10  <10 mg/dL Final   Comment: (NOTE) Lowest detectable limit for serum alcohol is 10 mg/dL.  For medical purposes only. Performed at [facility], [address], [place], [place] [address]    Sodium 01/18/2024 138  135 - 145 mmol/L Final   Potassium 01/18/2024 3.7  3.5 - 5.1 mmol/L Final   Chloride 01/18/2024 95 (L)  98 - 111 mmol/L Final   CO2 01/18/2024 26  22 - 32 mmol/L Final   Glucose, Bld 01/18/2024 94  70 - 99 mg/dL Final   Glucose reference range applies only to samples taken after fasting for at least 8 hours.   BUN 01/18/2024 <5 (L)  6 - 20 mg/dL Final   Creatinine, Ser 01/18/2024 0.76  0.61 - 1.24 mg/dL Final   Calcium  01/18/2024 9.2  8.9 - 10.3 mg/dL Final   Total Protein 96/93/7974 7.9  6.5 - 8.1 g/dL Final   Albumin 96/93/7974 3.7  3.5 - 5.0 g/dL Final   AST 96/93/7974 68 (H)  15 - 41 U/L Final   ALT 01/18/2024 23  0 - 44 U/L Final   Alkaline Phosphatase 01/18/2024 111  38 - 126 U/L Final   Total Bilirubin 01/18/2024 1.4 (H)  0.0 - 1.2 mg/dL Final   GFR, Estimated 01/18/2024 >60  >60 mL/min Final   Comment: (NOTE) Calculated using the CKD-EPI Creatinine Equation (2021)    Anion gap 01/18/2024 17 (H)  5 - 15 Final   Performed at [facility], [address]  [place], [place] [address]   Sodium 01/21/2024 134 (L)  135 - 145 mmol/L Final   Potassium 01/21/2024 4.1  3.5 - 5.1 mmol/L Final   Chloride 01/21/2024 99  98 - 111 mmol/L Final   CO2 01/21/2024 23  22 - 32 mmol/L Final   Glucose, Bld 01/21/2024 80  70 - 99 mg/dL Final   Glucose reference range applies only to samples taken after fasting for at least 8 hours.   BUN 01/21/2024 8  6 - 20 mg/dL Final   Creatinine, Ser 01/21/2024 0.69  0.61 - 1.24 mg/dL Final   Calcium  01/21/2024 9.9  8.9 - 10.3 mg/dL Final   Total Protein 96/90/7974 7.8  6.5 - 8.1 g/dL Final   Albumin 96/90/7974 3.8  3.5 - 5.0 g/dL Final   AST 96/90/7974 78 (H)  15 - 41 U/L Final   ALT 01/21/2024 33  0 - 44 U/L Final   Alkaline Phosphatase 01/21/2024 90  38 - 126 U/L Final   Total Bilirubin 01/21/2024 0.7  0.0 - 1.2 mg/dL Final   GFR, Estimated 01/21/2024 >60  >60 mL/min Final   Comment: (NOTE) Calculated using the CKD-EPI Creatinine Equation (2021)    Anion gap 01/21/2024 12  5 - 15 Final   Performed at [facility], [address], [place], [place] [address]   Prothrombin Time 01/21/2024 13.0  11.4 - 15.2 seconds Final   INR 01/21/2024 1.0  0.8 - 1.2 Final   Comment: (NOTE) INR goal varies based on device and disease states. Performed at [facility], [address], [place], [place] [address]    WBC 01/21/2024 5.5  4.0 - 10.5 K/uL Final   RBC 01/21/2024 3.75 (L)  4.22 - 5.81 MIL/uL Final   Hemoglobin 01/21/2024 11.9 (L)  13.0 - 17.0 g/dL Final   HCT 96/90/7974 36.1 (L)  39.0 - 52.0 % Final   MCV 01/21/2024 96.3  80.0 - 100.0 fL Final   MCH 01/21/2024 31.7  26.0 - 34.0 pg Final   MCHC 01/21/2024 33.0  30.0 - 36.0 g/dL Final   RDW 96/90/7974 14.3  11.5 - 15.5 % Final   Platelets 01/21/2024 261  150 - 400 K/uL Final   nRBC 01/21/2024 0.0  0.0 - 0.2 % Final   Performed at [facility], [address] [place], [place] [address]   Magnesium  01/21/2024 1.9  1.7 - 2.4 mg/dL Final   Performed at [facility], [address] [place], [place] [address]  There may be more visits with results that are not included.    Blood Alcohol level:  Lab Results  Component Value Date   [facility] <15 05/27/2024   ETH <15 05/20/2024    Metabolic Disorder Labs: Lab Results  Component Value Date   HGBA1C 4.4 (L) 05/20/2024   MPG 79.58 05/20/2024   MPG 82.45 01/29/2024   No results found for: PROLACTIN Lab Results  Component Value Date   CHOL 208 (H) 05/20/2024   TRIG 168 (H) 05/20/2024   HDL 79 05/20/2024   CHOLHDL 2.6 05/20/2024   VLDL 34 05/20/2024   LDLCALC 95 05/20/2024   LDLCALC 74 04/04/2024    Therapeutic Lab Levels: No results found for: LITHIUM No results found for: VALPROATE No results found for: CBMZ  Physical Findings   AIMS    Flowsheet Row [facility]  AIMS Total Score 0   AUDIT    Flowsheet Row Office Visit from 04/22/2024 in [facility] Admission (Discharged) [facility] Ad[facility] (Discharged) from [facility] Ad[facility] (Discharged[facility]  Alcohol Use Disorder Identification Test Final Score (AUDIT) 6 10 0 29   CAGE-AID    Flowsheet Row ED to Hosp-Admission (Discharged) from 06/22/2021 in [facility]  CAGE-AID Score 3   GAD-7    Flowsheet Row Office Visit from 04/22/2024 in [facility]  Total GAD-7 Score 0   Mini-Mental    Flowsheet Row Office Visit from 05/02/2024 in [facility]  Total Score (max 30 points ) 21   PHQ2-9    Flowsheet Row ED from 05/27/2024 in [facility] Office Visit from 04/22/2024 in [facility] Office Visit from 02/07/2024 in [facility] [place] ED from 01/18/2024 in [facility] Office Visit from 06/10/2022 in [facility] [place]  PHQ-2 Total Score 6 0 0 0 0  PHQ-9 Total Score 12 0 -- 3 --   Flowsheet Row ED from 05/27/2024 in [facility] Most recent reading at 05/27/2024 11:10 PM ED from 05/27/2024 in [facility] Most recent reading at 05/27/2024  6:22 PM ED to Hosp-Admission (Discharged) from 05/20/2024 in [place] [facility] Most recent reading at 05/20/2024 10:08 AM  C-SSRS RISK CATEGORY No Risk No Risk No Risk     Musculoskeletal  Strength & Muscle Tone: within normal limits Gait & Station: normal Patient leans: N/A  Psychiatric Specialty Exam  Presentation  General Appearance:  Appropriate for Environment; Casual; Fairly Groomed  Eye Contact: Fair  Speech: Clear and Coherent  Speech Volume: Normal  Handedness: Right  Mood and Affect  Mood: Euthymic  Affect: Congruent  Thought Process  Thought Processes: Coherent  Descriptions of Associations:Intact  Orientation:Full (Time, Place and Person)  Thought Content:Logical  Diagnosis of Schizophrenia or Schizoaffective disorder in past: No    Hallucinations:Hallucinations: None  Ideas of Reference:None  Suicidal Thoughts:Suicidal Thoughts: No  Homicidal Thoughts:Homicidal Thoughts: No   Sensorium  Memory: Immediate Fair; Recent Fair; Remote Fair  Judgment: Fair  Insight: Fair   Chartered certified accountant: Fair  Attention Span: Fair  Recall: [employer] of Knowledge: Fair  Language: Fair  Psychomotor Activity  Psychomotor Activity: Psychomotor Activity: Psychomotor Retardation G Assets  Assets: Communication Skills; Financial Resources/Insurance; Housing; Resilience  Sleep  Sleep: Sleep: Fair  Estimated  Sleeping Duration (Last 24 Hours): 11.50-13.50 hours  No data recorded  Physical Exam  Physical Exam ROS Blood pressure 100/61, pulse 77, temperature 98.4 F (36.9 C), temperature source Oral, resp. rate 18, SpO2 96%. There is no height or weight on file to calculate BMI.  Treatment Plan Summary: Given patient's complicated withdrawal history we will continue lorazepam  taper through 7/18 and then monitor closely (including CIWA) for 48-72hrs. Patient appears ambivalent about entering treatment at this time but is amenable to continuing naltrexone . May explore optimizing naltrexone  versus additional pharmacotherapy. Surveillance labs for likely micronutrient deficiencies such as Mg, Zn, B12, vit D. Initiate Mg preferably glycinate for deficiency and melatonin for sleep.  KANDI JAYSON HAHN, MD 05/30/2024 2:41 PM

## 2024-05-30 NOTE — ED Notes (Signed)
 Patients b/p = 85/61 and propanolol and ativan  held.  Dr Cole made aware.  B/P will be rechecked and when it is stable he will receive ativan  as per Dr Cole.  Propanolol dcd.

## 2024-05-30 NOTE — Group Note (Signed)
 Group Topic: Relapse and Recovery  Group Date: 05/30/2024 Start Time: 1210 End Time: 1315 Facilitators: Stanly Stabile, RN  Department: [facility]  Number of Participants: 4  Group Focus: chemical dependency education Treatment Modality:  Behavior Modification Therapy Interventions utilized were clarification, exploration, patient education, problem solving, and reality testing Purpose: enhance coping skills, explore maladaptive thinking, express feelings, express irrational fears, improve communication skills, increase insight, regain self-worth, reinforce self-care, and relapse prevention strategies  Name: Xavier Owens Date of Birth: [birth year]  MR: [id number]    Level of Participation: minimal Quality of Participation: attentive Interactions with others: gave feedback Mood/Affect: appropriate Triggers (if applicable):   Cognition: coherent/clear Progress: None Response:   Plan: follow-up needed  Patients Problems:  Patient Active Problem List   Diagnosis Date Noted   Schatzki's ring 05/23/2024   Encounter to discuss test results 05/22/2024   Non-cardiac chest pain 05/22/2024   Esophageal dysphagia 05/22/2024   Abnormal loss of weight 05/22/2024   Abnormal finding on GI tract imaging 05/22/2024   Chest pain 05/21/2024   Elevated troponin 05/21/2024   Hypotension 05/21/2024   Chest discomfort 05/20/2024   Electrolyte abnormality 05/20/2024   Alcohol withdrawal (HCC) 05/09/2024   AKI (acute kidney injury) (HCC) 05/09/2024   Leukocytosis 05/09/2024   Heat exhaustion, water deprivation 05/09/2024   Major depression, recurrent (HCC) 04/03/2024   Reactive depression 03/22/2024   Alcohol withdrawal syndrome with complication (HCC) 03/22/2024   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   Dehydration 03/21/2024   Acute lactic acidosis 03/21/2024   Delirium tremens (HCC) 03/18/2024   Nausea & vomiting 03/16/2024   Alcohol abuse 03/16/2024    Tremor 03/16/2024   Neuropathy 03/16/2024   Mood disorder (HCC) 03/16/2024   Memory impairment 02/08/2024   Right knee pain 02/08/2024   Major depressive disorder, recurrent severe without psychotic features (HCC) 01/25/2024   Alcohol withdrawal seizure (HCC) 01/23/2024   Alcohol use disorder, severe, dependence (HCC) 01/18/2024   Healthcare maintenance 06/10/2022   Nightmares 12/21/2021   Subclinical hypothyroidism 12/21/2021   Substance induced mood disorder (HCC) 12/14/2021   Alcohol use disorder, moderate, dependence (HCC) 06/23/2021   Seizures (HCC) 06/22/2021   Elevated liver enzymes 04/19/2016   Essential hypertension 04/19/2016

## 2024-05-30 NOTE — ED Notes (Signed)
 Patient is asleep in bed at this time.  No evidence of withdrawal.  No somatic distress.  He is calm and pleasant on approach.  Will monitor.

## 2024-05-30 NOTE — Discharge Planning (Signed)
 SW spoke with patient to get an update on his disposition and patient reported that he did not want to seek a residential program and is planning to return home with wife when stable for DC. SW made him aware it would likely be Monday for MD to DC him and he voiced full agreement. SW will arrange follow up appointment with his PCP for aftercare.

## 2024-05-30 NOTE — ED Notes (Addendum)
 Patient is in the bedroom calm and composed. NAD. No c/o nausea of vomiting over night. Will continue to monitor for safety.

## 2024-05-30 NOTE — Group Note (Signed)
 Group Topic: Social Support  Group Date: 05/30/2024 Start Time: 2000 End Time: 2030 Facilitators: Joan Plowman B  Department: [facility]  Number of Participants: 2  Group Focus: abuse issues, check in, coping skills, and daily focus Treatment Modality:  Individual Therapy Interventions utilized were leisure development Purpose: express feelings  Name: Xavier Owens Date of Birth: [birth year]  MR: [id number]    Level of Participation: active Quality of Participation: attentive, cooperative, and initiates communication Interactions with others: gave feedback Mood/Affect: appropriate Triggers (if applicable): NA Cognition: coherent/clear Progress: Gaining insight Response: NA Plan: patient will be encouraged to keep going to groups.   Patients Problems:  Patient Active Problem List   Diagnosis Date Noted   History of seizure due to alcohol withdrawal 05/30/2024   Gastroesophageal reflux disease without esophagitis 05/30/2024   Hyperlipidemia 05/30/2024   Schatzki's ring 05/23/2024   Encounter to discuss test results 05/22/2024   Non-cardiac chest pain 05/22/2024   Esophageal dysphagia 05/22/2024   Abnormal loss of weight 05/22/2024   Abnormal finding on GI tract imaging 05/22/2024   Chest pain 05/21/2024   Elevated troponin 05/21/2024   Hypotension 05/21/2024   Chest discomfort 05/20/2024   Electrolyte abnormality 05/20/2024   Alcohol withdrawal (HCC) 05/09/2024   AKI (acute kidney injury) (HCC) 05/09/2024   Leukocytosis 05/09/2024   Heat exhaustion, water deprivation 05/09/2024   Major depression, recurrent (HCC) 04/03/2024   Reactive depression 03/22/2024   Alcohol withdrawal syndrome with complication (HCC) 03/22/2024   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   Dehydration 03/21/2024   Acute lactic acidosis 03/21/2024   Delirium tremens (HCC) 03/18/2024   Nausea & vomiting 03/16/2024   Alcohol use disorder 03/16/2024   Tremor  03/16/2024   Neuropathy 03/16/2024   Mood disorder (HCC) 03/16/2024   Memory impairment 02/08/2024   Right knee pain 02/08/2024   Major depressive disorder, recurrent severe without psychotic features (HCC) 01/25/2024   Alcohol withdrawal seizure (HCC) 01/23/2024   Alcohol use disorder, severe, dependence (HCC) 01/18/2024   Healthcare maintenance 06/10/2022   Nightmares 12/21/2021   Subclinical hypothyroidism 12/21/2021   Substance induced mood disorder (HCC) 12/14/2021   Alcohol use disorder, moderate, dependence (HCC) 06/23/2021   Seizures (HCC) 06/22/2021   Elevated liver enzymes 04/19/2016   Essential hypertension 04/19/2016

## 2024-05-31 DIAGNOSIS — K219 Gastro-esophageal reflux disease without esophagitis: Secondary | ICD-10-CM | POA: Diagnosis not present

## 2024-05-31 DIAGNOSIS — F1010 Alcohol abuse, uncomplicated: Secondary | ICD-10-CM | POA: Diagnosis not present

## 2024-05-31 DIAGNOSIS — Z8639 Personal history of other endocrine, nutritional and metabolic disease: Secondary | ICD-10-CM | POA: Diagnosis not present

## 2024-05-31 DIAGNOSIS — E785 Hyperlipidemia, unspecified: Secondary | ICD-10-CM | POA: Diagnosis not present

## 2024-05-31 DIAGNOSIS — Z09 Encounter for follow-up examination after completed treatment for conditions other than malignant neoplasm: Secondary | ICD-10-CM

## 2024-05-31 DIAGNOSIS — F331 Major depressive disorder, recurrent, moderate: Secondary | ICD-10-CM | POA: Diagnosis not present

## 2024-05-31 LAB — GLUCOSE, CAPILLARY: Glucose-Capillary: 100 mg/dL — ABNORMAL HIGH (ref 70–99)

## 2024-05-31 NOTE — ED Notes (Signed)
 Pt provided dinner.

## 2024-05-31 NOTE — ED Notes (Signed)
 Patient resting with eyes closed in no apparent acute distress. Respirations even and unlabored. Environment secured. Safety checks in place according to facility policy.

## 2024-05-31 NOTE — ED Notes (Signed)
 Patient is sleeping. Respirations equal and unlabored. No change in assessment or acuity. Routine safety checks conducted according to facility protocol.

## 2024-05-31 NOTE — ED Provider Notes (Signed)
 Behavioral Health Progress Note  Date and Time: 05/31/2024 4:27 PM Name: Xavier Owens MRN:  [id number]  HPI: Xavier Owens is a 53 y.o. male who [facility] [place] [facility] with worsening depressive symptoms & SI as well as alcohol withdrawals symptoms. Per triage:  Pt presents to [facility] unaccompanied. Pt states he is having withdrawls from alcohol starting this morning. Pt reports that he drank yesterday and all weekend. Pt mentions he had a fifth of alcohol yesterday. Pt mentions he is drinking every weekend.  Subjective:  Patient is feeling pretty alright today. He slept well overnight. He denies symptoms of withdrawal. He hopes to discharge early next week and finds Tuesday to be acceptable. He has marginally moved from being pre-contemplative with regards to EtOH treatment. He does not want residential but he's willing to consider outpatient if I can find the time. He does know why he's been taking iron supplement inpatient, he denies anemia history or prior treatment with Fe supplements. He denies history of symptoms associated with hypoglycemia as well.   Diagnosis:  Final diagnoses:  Hyperlipidemia, unspecified hyperlipidemia type  Gastroesophageal reflux disease without esophagitis  Alcohol use disorder  Moderate episode of recurrent major depressive disorder (HCC)  History of seizure due to alcohol withdrawal   Total Time spent with patient: I personally spent 25 minutes on the unit in direct patient care. The direct patient care time included face-to-face time with the patient, reviewing the patient's chart, communicating with other professionals, and coordinating care. Greater than 50% of this time was spent in counseling or coordinating care with the patient regarding goals of hospitalization, psycho-education, and discharge planning needs.   Social History:  Pain Medications: See MAR Prescriptions: See MAR Over the Counter: See MAR History  of alcohol / drug use?: Yes Longest period of sobriety (when/how long): Unsure. Negative Consequences of Use:  (Unsure.) Withdrawal Symptoms: Seizures, Irritability, Nausea / Vomiting Onset of Seizures: Unknown Date of most recent seizure: sometime this year, per his report  Sleep:good   Appetite:  Fair   Current Medications:  Current Facility-Administered Medications  Medication Dose Route Frequency Provider Last Rate Last Admin   acetaminophen  (TYLENOL ) tablet 650 mg  650 mg Oral Q6H PRN Trudy Carwin, NP       alum & mag hydroxide-simeth (MAALOX/MYLANTA) 200-200-20 MG/5ML suspension 30 mL  30 mL Oral Q4H PRN Trudy Carwin, NP       atorvastatin  (LIPITOR) tablet 40 mg  40 mg Oral QPM Nkwenti, Doris, NP   40 mg at 05/30/24 1724   DULoxetine  (CYMBALTA ) DR capsule 60 mg  60 mg Oral Daily Nkwenti, Doris, NP   60 mg at 05/31/24 9065   ferrous sulfate  tablet 325 mg  325 mg Oral QHS Nkwenti, Donia, NP   325 mg at 05/30/24 2116   folic acid  (FOLVITE ) tablet 1 mg  1 mg Oral Daily Nkwenti, Donia, NP   1 mg at 05/31/24 9065   gabapentin  (NEURONTIN ) capsule 600 mg  600 mg Oral TID Tex Donia, NP   600 mg at 05/31/24 0933   magnesium  hydroxide (MILK OF MAGNESIA) suspension 30 mL  30 mL Oral Daily PRN Trudy Carwin, NP       melatonin tablet 3 mg  3 mg Oral QHS Cole Kandi BROCKS, MD   3 mg at 05/30/24 2116   multivitamin with minerals tablet 1 tablet  1 tablet Oral Daily Tex Donia, NP   1 tablet at 05/31/24 0933   naltrexone  (DEPADE) tablet  50 mg  50 mg Oral Daily Tex Drilling, NP   50 mg at 05/31/24 0934   OLANZapine  (ZYPREXA ) injection 10 mg  10 mg Intramuscular TID PRN Trudy Carwin, NP       OLANZapine  (ZYPREXA ) injection 5 mg  5 mg Intramuscular TID PRN Trudy Carwin, NP       OLANZapine  zydis (ZYPREXA ) disintegrating tablet 5 mg  5 mg Oral TID PRN Trudy Carwin, NP       pantoprazole  (PROTONIX ) EC tablet 40 mg  40 mg Oral Daily Nkwenti, Doris, NP   40 mg at 05/31/24 0933    QUEtiapine  (SEROQUEL ) tablet 100 mg  100 mg Oral QHS Tex Drilling, NP   100 mg at 05/30/24 2116   thiamine  (VITAMIN B1) tablet 100 mg  100 mg Oral Daily Cole Kandi BROCKS, MD   100 mg at 05/31/24 9065   traZODone  (DESYREL ) tablet 100 mg  100 mg Oral QHS Tex Drilling, NP   100 mg at 05/30/24 2116   Current Outpatient Medications  Medication Sig Dispense Refill   atorvastatin  (LIPITOR) 40 MG tablet Take 1 tablet (40 mg total) by mouth daily. 30 tablet 0   Cholecalciferol  (VITAMIN D -3 PO) Take 1 capsule by mouth daily.     DULoxetine  (CYMBALTA ) 60 MG capsule Take 1 capsule (60 mg total) by mouth daily. 30 capsule 3   ferrous sulfate  325 (65 FE) MG tablet Take 1 tablet (325 mg total) by mouth at bedtime. 30 tablet 0   folic acid  (FOLVITE ) 1 MG tablet Take 1 tablet (1 mg total) by mouth daily. 30 tablet 0   gabapentin  (NEURONTIN ) 300 MG capsule Take 600 mg by mouth 3 (three) times daily.     hydrOXYzine  (ATARAX ) 25 MG tablet Take 1 tablet (25 mg total) by mouth every 6 (six) hours as needed for anxiety (or CIWA score </= 10). 90 tablet 3   Multiple Vitamin (MULTIVITAMIN WITH MINERALS) TABS tablet Take 1 tablet by mouth daily.     naltrexone  (DEPADE) 50 MG tablet Take 1 tablet (50 mg total) by mouth daily. 30 tablet 3   pantoprazole  (PROTONIX ) 40 MG tablet Take 1 tablet (40 mg total) by mouth daily. 30 tablet 2   propranolol  (INDERAL ) 10 MG tablet Take 10 mg by mouth 2 (two) times daily.     QUEtiapine  (SEROQUEL ) 50 MG tablet Take 1 tablet (50 mg total) by mouth at bedtime. 30 tablet 0   thiamine  (VITAMIN B1) 100 MG tablet Take 100 mg by mouth daily.     traZODone  (DESYREL ) 50 MG tablet Take 1 tablet (50 mg total) by mouth at bedtime. 30 tablet 0    Labs  Lab Results:  Admission on 05/27/2024  Component Date Value Ref Range Status   SARS Coronavirus 2 by RT PCR 05/30/2024 NEGATIVE  NEGATIVE Final   Performed at [facility], [address], [place], [place] [address]   WBC 05/27/2024  8.2  4.0 - 10.5 K/uL Final   RBC 05/27/2024 4.86  4.22 - 5.81 MIL/uL Final   Hemoglobin 05/27/2024 13.9  13.0 - 17.0 g/dL Final   HCT 92/85/7974 41.7  39.0 - 52.0 % Final   MCV 05/27/2024 85.8  80.0 - 100.0 fL Final   MCH 05/27/2024 28.6  26.0 - 34.0 pg Final   MCHC 05/27/2024 33.3  30.0 - 36.0 g/dL Final   RDW 92/85/7974 14.3  11.5 - 15.5 % Final   Platelets 05/27/2024 346  150 - 400 K/uL Final  nRBC 05/27/2024 0.0  0.0 - 0.2 % Final   Neutrophils Relative % 05/27/2024 64  % Final   Neutro Abs 05/27/2024 5.3  1.7 - 7.7 K/uL Final   Lymphocytes Relative 05/27/2024 18  % Final   Lymphs Abs 05/27/2024 1.5  0.7 - 4.0 K/uL Final   Monocytes Relative 05/27/2024 16  % Final   Monocytes Absolute 05/27/2024 1.3 (H)  0.1 - 1.0 K/uL Final   Eosinophils Relative 05/27/2024 1  % Final   Eosinophils Absolute 05/27/2024 0.1  0.0 - 0.5 K/uL Final   Basophils Relative 05/27/2024 1  % Final   Basophils Absolute 05/27/2024 0.1  0.0 - 0.1 K/uL Final   Immature Granulocytes 05/27/2024 0  % Final   Abs Immature Granulocytes 05/27/2024 0.03  0.00 - 0.07 K/uL Final   Performed at [facility], [address], [place], [place] [address]   Sodium 05/27/2024 132 (L)  135 - 145 mmol/L Final   Potassium 05/27/2024 3.2 (L)  3.5 - 5.1 mmol/L Final   Chloride 05/27/2024 94 (L)  98 - 111 mmol/L Final   CO2 05/27/2024 21 (L)  22 - 32 mmol/L Final   Glucose, Bld 05/27/2024 97  70 - 99 mg/dL Final   Glucose reference range applies only to samples taken after fasting for at least 8 hours.   BUN 05/27/2024 7  6 - 20 mg/dL Final   Creatinine, Ser 05/27/2024 0.93  0.61 - 1.24 mg/dL Final   Calcium  05/27/2024 10.0  8.9 - 10.3 mg/dL Final   Total Protein 92/85/7974 7.9  6.5 - 8.1 g/dL Final   Albumin 92/85/7974 4.2  3.5 - 5.0 g/dL Final   AST 92/85/7974 47 (H)  15 - 41 U/L Final   ALT 05/27/2024 24  0 - 44 U/L Final   Alkaline Phosphatase 05/27/2024 120  38 - 126 U/L Final   Total Bilirubin 05/27/2024 0.9  0.0 -  1.2 mg/dL Final   GFR, Estimated 05/27/2024 >60  >60 mL/min Final   Comment: (NOTE) Calculated using the CKD-EPI Creatinine Equation (2021)    Anion gap 05/27/2024 17 (H)  5 - 15 Final   Performed at [facility], [address], [place], [place] [address]   Alcohol, Ethyl (B) 05/27/2024 <15  <15 mg/dL Final   Comment: (NOTE) For medical purposes only. Performed at [facility], [address] [place], [place] [address]    TSH 05/27/2024 3.436  0.350 - 4.500 uIU/mL Final   Comment: Performed by a 3rd Generation assay with a functional sensitivity of <=0.01 uIU/mL. Performed at [facility], [address], [place], [place] [address]    POC Amphetamine UR 05/30/2024 None Detected  NONE DETECTED (Cut Off Level 1000 ng/mL) Final   POC Secobarbital (BAR) 05/30/2024 None Detected  NONE DETECTED (Cut Off Level 300 ng/mL) Final   POC Buprenorphine (BUP) 05/30/2024 None Detected  NONE DETECTED (Cut Off Level 10 ng/mL) Final   POC Oxazepam (BZO) 05/30/2024 Positive (A)  NONE DETECTED (Cut Off Level 300 ng/mL) Final   POC Cocaine UR 05/30/2024 None Detected  NONE DETECTED (Cut Off Level 300 ng/mL) Final   POC Methamphetamine UR 05/30/2024 None Detected  NONE DETECTED (Cut Off Level 1000 ng/mL) Final   POC Morphine  05/30/2024 None Detected  NONE DETECTED (Cut Off Level 300 ng/mL) Final   POC Methadone UR 05/30/2024 None Detected  NONE DETECTED (Cut Off Level 300 ng/mL) Final   POC Oxycodone  UR 05/30/2024 None  Detected  NONE DETECTED (Cut Off Level 100 ng/mL) Final   POC Marijuana UR 05/30/2024 Positive (A)  NONE DETECTED (Cut Off Level 50 ng/mL) Final   Color, Urine 05/30/2024 YELLOW  YELLOW Final   APPearance 05/30/2024 CLEAR  CLEAR Final   Specific Gravity, Urine 05/30/2024 1.004 (L)  1.005 - 1.030 Final   pH 05/30/2024 7.0  5.0 - 8.0 Final   Glucose, UA 05/30/2024 NEGATIVE  NEGATIVE mg/dL Final   Hgb urine dipstick 05/30/2024 SMALL (A)  NEGATIVE Final   Bilirubin Urine  05/30/2024 NEGATIVE  NEGATIVE Final   Ketones, ur 05/30/2024 NEGATIVE  NEGATIVE mg/dL Final   Protein, ur 92/82/7974 NEGATIVE  NEGATIVE mg/dL Final   Nitrite 92/82/7974 NEGATIVE  NEGATIVE Final   Leukocytes,Ua 05/30/2024 NEGATIVE  NEGATIVE Final   RBC / HPF 05/30/2024 0-5  0 - 5 RBC/hpf Final   WBC, UA 05/30/2024 0-5  0 - 5 WBC/hpf Final   Bacteria, UA 05/30/2024 NONE SEEN  NONE SEEN Final   Squamous Epithelial / HPF 05/30/2024 0-5  0 - 5 /HPF Final   Performed at [facility], [address] [place], [place] [address]   Sodium 05/30/2024 134 (L)  135 - 145 mmol/L Final   Potassium 05/30/2024 3.6  3.5 - 5.1 mmol/L Final   Chloride 05/30/2024 95 (L)  98 - 111 mmol/L Final   CO2 05/30/2024 27  22 - 32 mmol/L Final   Glucose, Bld 05/30/2024 48 (L)  70 - 99 mg/dL Final   Glucose reference range applies only to samples taken after fasting for at least 8 hours.   BUN 05/30/2024 8  6 - 20 mg/dL Final   Creatinine, Ser 05/30/2024 0.82  0.61 - 1.24 mg/dL Final   Calcium  05/30/2024 9.6  8.9 - 10.3 mg/dL Final   GFR, Estimated 05/30/2024 >60  >60 mL/min Final   Comment: (NOTE) Calculated using the CKD-EPI Creatinine Equation (2021)    Anion gap 05/30/2024 12  5 - 15 Final   Performed at [facility], [address] [place], [place] [address]   Vit D, 25-Hydroxy 05/30/2024 59.65  30 - 100 ng/mL Final   Comment: (NOTE) Vitamin D  deficiency has been defined by the Institute of Medicine  and an Endocrine Society practice guideline as a level of serum 25-OH  vitamin D  less than 20 ng/mL (1,2). The Endocrine Society went on to  further define vitamin D  insufficiency as a level between 21 and 29  ng/mL (2).  1. IOM (Institute of Medicine). 2010. Dietary reference intakes for  calcium  and D. [place]: [employer]. 2. Holick MF, Binkley Zaleski, Bischoff-Ferrari HA, et al. Evaluation,  treatment, and prevention of vitamin D  deficiency: an Endocrine  Society clinical  practice guideline, JCEM. 2011 Jul; 96(7): 1911-30.  Performed at [facility], [address], [place], [place] [address]    Vitamin B-12 05/30/2024 393  180 - 914 pg/mL Final   Comment: (NOTE) This assay is not validated for testing neonatal or myeloproliferative syndrome specimens for Vitamin B12 levels. Performed at [facility], [address] [place], [place] [address]   Admission on 05/20/2024, Discharged on 05/24/2024  Component Date Value Ref Range Status   WBC 05/20/2024 15.9 (H)  4.0 - 10.5 K/uL Final   RBC 05/20/2024 4.91  4.22 - 5.81 MIL/uL Final   Hemoglobin 05/20/2024 13.8  13.0 - 17.0 g/dL Final   HCT 92/92/7974 42.8  39.0 - 52.0 % Final  MCV 05/20/2024 87.2  80.0 - 100.0 fL Final   MCH 05/20/2024 28.1  26.0 - 34.0 pg Final   MCHC 05/20/2024 32.2  30.0 - 36.0 g/dL Final   RDW 92/92/7974 14.2  11.5 - 15.5 % Final   Platelets 05/20/2024 353  150 - 400 K/uL Final   nRBC 05/20/2024 0.0  0.0 - 0.2 % Final   Performed at [facility], [address] [place], [place] [address]   Sodium 05/20/2024 135  135 - 145 mmol/L Final   Potassium 05/20/2024 3.3 (L)  3.5 - 5.1 mmol/L Final   Chloride 05/20/2024 99  98 - 111 mmol/L Final   CO2 05/20/2024 11 (L)  22 - 32 mmol/L Final   Glucose, Bld 05/20/2024 87  70 - 99 mg/dL Final   Glucose reference range applies only to samples taken after fasting for at least 8 hours.   BUN 05/20/2024 5 (L)  6 - 20 mg/dL Final   Creatinine, Ser 05/20/2024 1.16  0.61 - 1.24 mg/dL Final   Calcium  05/20/2024 9.5  8.9 - 10.3 mg/dL Final   Total Protein 92/92/7974 7.9  6.5 - 8.1 g/dL Final   Albumin 92/92/7974 3.8  3.5 - 5.0 g/dL Final   AST 92/92/7974 52 (H)  15 - 41 U/L Final   ALT 05/20/2024 21  0 - 44 U/L Final   Alkaline Phosphatase 05/20/2024 97  38 - 126 U/L Final   Total Bilirubin 05/20/2024 2.3 (H)  0.0 - 1.2 mg/dL Final   GFR, Estimated 05/20/2024 >60  >60 mL/min Final   Comment: (NOTE) Calculated using the CKD-EPI  Creatinine Equation (2021)    Anion gap 05/20/2024 25 (H)  5 - 15 Corrected   Comment: REPEATED TO VERIFY Performed at [facility], [address], [place], [place] [address] CORRECTED ON 07/07 AT 1105: PREVIOUSLY REPORTED AS 25 CRITICAL RESULT CALLED TO, READ BACK BY AND VERIFIED WITH DEVOLT,TRAVIS RN 1047 05/20/24 AMIREHSNAIF REPEATED TO VERIFY    Lipase 05/20/2024 32  11 - 51 U/L Final   Performed at [facility], [address] [place], [place] [address]   Troponin I (High Sensitivity) 05/20/2024 131 (HH)  <18 ng/L Final   Comment: CRITICAL RESULT CALLED TO, READ BACK BY AND VERIFIED WITH DEVOLT,TRAVIS RN 1047 05/20/24 AMIREHSNAIF (NOTE) Elevated high sensitivity troponin I (hsTnI) values and significant  changes across serial measurements may suggest ACS but many other  chronic and acute conditions are known to elevate hsTnI results.  Refer to the Links section for chest pain algorithms and additional  guidance. Performed at [facility], [address] [place], [place] [address]    Troponin I (High Sensitivity) 05/20/2024 76 (H)  <18 ng/L Final   Comment: (NOTE) Elevated high sensitivity troponin I (hsTnI) values and significant  changes across serial measurements may suggest ACS but many other  chronic and acute conditions are known to elevate hsTnI results.  Refer to the Links section for chest pain algorithms and additional  guidance. Performed at [facility], [address] [place], [place] [address]    pH, Ven 05/20/2024 7.443 (H)  7.25 - 7.43 Final   pCO2, Ven 05/20/2024 22.4 (L)  44 - 60 mmHg Final   pO2, Ven 05/20/2024 71 (H)  32 - 45 mmHg Final   Bicarbonate 05/20/2024 15.3 (L)  20.0 - 28.0 mmol/L Final   TCO2 05/20/2024 16 (L)  22 - 32 mmol/L Final   O2  Saturation 05/20/2024 95  % Final   Acid-base deficit 05/20/2024 6.0 (H)  0.0 - 2.0 mmol/L Final   Sodium 05/20/2024 128 (L)  135 - 145 mmol/L Final   Potassium 05/20/2024 5.8 (H)  3.5  - 5.1 mmol/L Final   Calcium , Ion 05/20/2024 0.94 (L)  1.15 - 1.40 mmol/L Final   HCT 05/20/2024 48.0  39.0 - 52.0 % Final   Hemoglobin 05/20/2024 16.3  13.0 - 17.0 g/dL Final   Sample type 92/92/7974 VENOUS   Final   Color, Urine 05/20/2024 AMBER (A)  YELLOW Final   BIOCHEMICALS MAY BE AFFECTED BY COLOR   APPearance 05/20/2024 HAZY (A)  CLEAR Final   Specific Gravity, Urine 05/20/2024 1.026  1.005 - 1.030 Final   pH 05/20/2024 5.0  5.0 - 8.0 Final   Glucose, UA 05/20/2024 NEGATIVE  NEGATIVE mg/dL Final   Hgb urine dipstick 05/20/2024 SMALL (A)  NEGATIVE Final   Bilirubin Urine 05/20/2024 NEGATIVE  NEGATIVE Final   Ketones, ur 05/20/2024 80 (A)  NEGATIVE mg/dL Final   Protein, ur 92/92/7974 100 (A)  NEGATIVE mg/dL Final   Nitrite 92/92/7974 NEGATIVE  NEGATIVE Final   Leukocytes,Ua 05/20/2024 NEGATIVE  NEGATIVE Final   RBC / HPF 05/20/2024 0-5  0 - 5 RBC/hpf Final   WBC, UA 05/20/2024 0-5  0 - 5 WBC/hpf Final   Bacteria, UA 05/20/2024 RARE (A)  NONE SEEN Final   Squamous Epithelial / HPF 05/20/2024 0-5  0 - 5 /HPF Final   Mucus 05/20/2024 PRESENT   Final   Hyaline Casts, UA 05/20/2024 PRESENT   Final   Performed at [facility], [address] [place], [place] [address]   Osmolality 05/20/2024 279  275 - 295 mOsm/kg Final   Comment: REPEATED TO VERIFY Performed at [facility], [address], [place], [place] [address]    Weight 05/20/2024 2,528  oz Final   Height 05/20/2024 68  in Final   BP 05/20/2024 122/71  mmHg Final   S' Lateral 05/20/2024 2.50  cm Final   Area-P 1/2 05/20/2024 4.96  cm2 Final   Est EF 05/20/2024 60 - 65%   Final   Cholesterol 05/20/2024 208 (H)  0 - 200 mg/dL Final   Triglycerides 92/92/7974 168 (H)  <150 mg/dL Final   HDL 92/92/7974 79  >40 mg/dL Final   Total CHOL/HDL Ratio 05/20/2024 2.6  RATIO Final   VLDL 05/20/2024 34  0 - 40 mg/dL Final   LDL Cholesterol 05/20/2024 95  0 - 99 mg/dL Final   Comment:        Total Cholesterol/HDL:CHD  Risk Coronary Heart Disease Risk Table                     Men   Women  1/2 Average Risk   3.4   3.3  Average Risk       5.0   4.4  2 X Average Risk   9.6   7.1  3 X Average Risk  23.4   11.0        Use the calculated Patient Ratio above and the CHD Risk Table to determine the patient's CHD Risk.        ATP III CLASSIFICATION (LDL):  <100     mg/dL   Optimal  899-870  mg/dL   Near or Above                    Optimal  130-159  mg/dL   Borderline  839-810  mg/dL   High  >809     mg/dL   Very High Performed at [facility], [address], [place], [place] [address]    Hgb A1c MFr Bld 05/20/2024 4.4 (L)  4.8 - 5.6 % Final   Comment: (NOTE) Diagnosis of Diabetes The following HbA1c ranges recommended by the American Diabetes Association (ADA) may be used as an aid in the diagnosis of diabetes mellitus.  Hemoglobin             Suggested A1C NGSP%              Diagnosis  <5.7                   Non Diabetic  5.7-6.4                Pre-Diabetic  >6.4                   Diabetic  <7.0                   Glycemic control for                       adults with diabetes.     Mean Plasma Glucose 05/20/2024 79.58  mg/dL Final   Performed at [facility], [address], [place], [place] [address]   CRP 05/20/2024 3.3 (H)  <1.0 mg/dL Final   Performed at [facility], [address], [place], [place] [address]   Sed Rate 05/20/2024 4  0 - 16 mm/hr Final   Performed at [facility], [address] [place], [place] [address]   Alcohol, Ethyl (B) 05/20/2024 <15  <15 mg/dL Final   Comment: (NOTE) For medical purposes only. Performed at [facility], [address] [place], [place] [address]    Opiates 05/21/2024 NONE DETECTED  NONE DETECTED Final   Cocaine 05/21/2024 NONE DETECTED  NONE DETECTED Final   Benzodiazepines 05/21/2024 NONE DETECTED  NONE DETECTED Final   Amphetamines 05/21/2024 NONE DETECTED  NONE DETECTED Final   Tetrahydrocannabinol  05/21/2024 NONE DETECTED  NONE DETECTED Final   Barbiturates 05/21/2024 NONE DETECTED  NONE DETECTED Final   Comment: (NOTE) DRUG SCREEN FOR MEDICAL PURPOSES ONLY.  IF CONFIRMATION IS NEEDED FOR ANY PURPOSE, NOTIFY LAB WITHIN 5 DAYS.  LOWEST DETECTABLE LIMITS FOR URINE DRUG SCREEN Drug Class                     Cutoff (ng/mL) Amphetamine and metabolites    1000 Barbiturate and metabolites    200 Benzodiazepine                 200 Opiates and metabolites        300 Cocaine and metabolites        300 THC                            50 Performed at [facility] Lab, [address] [place], [place] [address]    Sodium 05/21/2024 134 (L)  135 - 145 mmol/L Final   Potassium 05/21/2024 3.1 (L)  3.5 - 5.1 mmol/L Final   Chloride 05/21/2024 103  98 - 111 mmol/L Final   CO2 05/21/2024 23  22 - 32 mmol/L Final   Glucose, Bld 05/21/2024 116 (H)  70 - 99 mg/dL Final   Glucose reference range applies only to samples taken after fasting for at least 8 hours.   BUN 05/21/2024 7  6 - 20 mg/dL Final   Creatinine, Ser 05/21/2024 0.91  0.61 - 1.24 mg/dL Final   Calcium  05/21/2024 9.0  8.9 - 10.3 mg/dL Final   GFR, Estimated 05/21/2024 >60  >60 mL/min Final   Comment: (NOTE) Calculated using the CKD-EPI Creatinine Equation (2021)    Anion gap 05/21/2024 8  5 - 15 Final   Performed at [facility], [address] [place], [place] [address]   Lactic Acid, Venous 05/20/2024 2.4 (HH)  0.5 - 1.9 mmol/L Final   Comment: CRITICAL RESULT CALLED TO, READ BACK BY AND VERIFIED WITH FABIENE BOWLER, RN AT 2128 07.07.25 JLASIGAN Performed at [facility] Lab, [address] [place], [place] [address]    WBC 05/21/2024 6.2  4.0 - 10.5 K/uL Final   RBC 05/21/2024 4.54  4.22 - 5.81 MIL/uL Final   Hemoglobin 05/21/2024 12.9 (L)  13.0 - 17.0 g/dL Final   HCT 92/91/7974 37.9 (L)  39.0 - 52.0 % Final   MCV 05/21/2024 83.5  80.0 - 100.0 fL Final   MCH 05/21/2024 28.4  26.0 - 34.0 pg Final   MCHC 05/21/2024  34.0  30.0 - 36.0 g/dL Final   RDW 92/91/7974 14.5  11.5 - 15.5 % Final   Platelets 05/21/2024 270  150 - 400 K/uL Final   nRBC 05/21/2024 0.0  0.0 - 0.2 % Final   Performed at [facility] Lab, [address] [place], [place] [address]   Lactic Acid, Venous 05/20/2024 2.4 (HH)  0.5 - 1.9 mmol/L Final   Comment: CRITICAL VALUE NOTED. VALUE IS CONSISTENT WITH PREVIOUSLY REPORTED/CALLED VALUE Performed at [facility] Lab, [address] [place], [place] [address]    Lactic Acid, Venous 05/21/2024 1.6  0.5 - 1.9 mmol/L Final   Performed at [facility] Lab, [address] [place], [place] [address]   Magnesium  05/21/2024 1.7  1.7 - 2.4 mg/dL Final   Performed at [facility] Lab, [address] [place], [place] [address]   WBC 05/22/2024 6.1  4.0 - 10.5 K/uL Final   RBC 05/22/2024 3.83 (L)  4.22 - 5.81 MIL/uL Final   Hemoglobin 05/22/2024 11.0 (L)  13.0 - 17.0 g/dL Final   HCT 92/90/7974 33.0 (L)  39.0 - 52.0 % Final   MCV 05/22/2024 86.2  80.0 - 100.0 fL Final   MCH 05/22/2024 28.7  26.0 - 34.0 pg Final   MCHC 05/22/2024 33.3  30.0 - 36.0 g/dL Final   RDW 92/90/7974 14.4  11.5 - 15.5 % Final   Platelets 05/22/2024 219  150 - 400 K/uL Final   nRBC 05/22/2024 0.0  0.0 - 0.2 % Final   Performed at [facility], [address], [place], [place] [address]   Sodium 05/22/2024 137  135 - 145 mmol/L Final   Potassium 05/22/2024 3.6  3.5 - 5.1 mmol/L Final   Chloride 05/22/2024 107  98 - 111 mmol/L Final   CO2 05/22/2024 24  22 - 32 mmol/L Final   Glucose, Bld 05/22/2024 100 (H)  70 - 99 mg/dL Final   Glucose reference range applies only to samples taken after fasting for at least 8 hours.   BUN 05/22/2024 5 (L)  6 - 20 mg/dL Final   Creatinine, Ser 05/22/2024 0.75  0.61 - 1.24 mg/dL  Final   Calcium  05/22/2024 8.1 (L)  8.9 - 10.3 mg/dL Final   GFR, Estimated 05/22/2024 >60  >60 mL/min Final   Comment: (NOTE) Calculated using the CKD-EPI Creatinine Equation (2021)    Anion  gap 05/22/2024 6  5 - 15 Final   Performed at [facility] [facility], [address] [place], [place] [address]   WBC 05/23/2024 6.3  4.0 - 10.5 K/uL Final   RBC 05/23/2024 4.58  4.22 - 5.81 MIL/uL Final   Hemoglobin 05/23/2024 12.8 (L)  13.0 - 17.0 g/dL Final   HCT 92/89/7974 39.5  39.0 - 52.0 % Final   MCV 05/23/2024 86.2  80.0 - 100.0 fL Final   MCH 05/23/2024 27.9  26.0 - 34.0 pg Final   MCHC 05/23/2024 32.4  30.0 - 36.0 g/dL Final   RDW 92/89/7974 14.6  11.5 - 15.5 % Final   Platelets 05/23/2024 252  150 - 400 K/uL Final   nRBC 05/23/2024 0.0  0.0 - 0.2 % Final   Performed at [facility], [address], [place], [place] [address]   Sodium 05/23/2024 139  135 - 145 mmol/L Final   Potassium 05/23/2024 3.4 (L)  3.5 - 5.1 mmol/L Final   Chloride 05/23/2024 102  98 - 111 mmol/L Final   CO2 05/23/2024 22  22 - 32 mmol/L Final   Glucose, Bld 05/23/2024 92  70 - 99 mg/dL Final   Glucose reference range applies only to samples taken after fasting for at least 8 hours.   BUN 05/23/2024 <5 (L)  6 - 20 mg/dL Final   Creatinine, Ser 05/23/2024 0.64  0.61 - 1.24 mg/dL Final   Calcium  05/23/2024 9.7  8.9 - 10.3 mg/dL Final   GFR, Estimated 05/23/2024 >60  >60 mL/min Final   Comment: (NOTE) Calculated using the CKD-EPI Creatinine Equation (2021)    Anion gap 05/23/2024 15  5 - 15 Final   Performed at [facility], [address], [place], [place] [address]   Magnesium  05/23/2024 1.6 (L)  1.7 - 2.4 mg/dL Final   Performed at [facility], [address], [place], [place] [address]   WBC 05/24/2024 5.1  4.0 - 10.5 K/uL Final   RBC 05/24/2024 4.36  4.22 - 5.81 MIL/uL Final   Hemoglobin 05/24/2024 12.6 (L)  13.0 - 17.0 g/dL Final   HCT 92/88/7974 38.0 (L)  39.0 - 52.0 % Final   MCV 05/24/2024 87.2  80.0 - 100.0 fL Final   MCH 05/24/2024 28.9  26.0 - 34.0 pg Final   MCHC 05/24/2024 33.2  30.0 - 36.0 g/dL Final   RDW 92/88/7974 14.7  11.5 - 15.5 % Final   Platelets 05/24/2024  237  150 - 400 K/uL Final   nRBC 05/24/2024 0.0  0.0 - 0.2 % Final   Performed at [facility], [address], [place], [place] [address]   Sodium 05/24/2024 136  135 - 145 mmol/L Final   Potassium 05/24/2024 3.6  3.5 - 5.1 mmol/L Final   Chloride 05/24/2024 103  98 - 111 mmol/L Final   CO2 05/24/2024 22  22 - 32 mmol/L Final   Glucose, Bld 05/24/2024 100 (H)  70 - 99 mg/dL Final   Glucose reference range applies only to samples taken after fasting for at least 8 hours.   BUN 05/24/2024 6  6 - 20 mg/dL Final   Creatinine, Ser 05/24/2024 0.61  0.61 - 1.24 mg/dL Final   Calcium   05/24/2024 9.4  8.9 - 10.3 mg/dL Final   Phosphorus 92/88/7974 4.9 (H)  2.5 - 4.6 mg/dL Final   Albumin 92/88/7974 3.3 (L)  3.5 - 5.0 g/dL Final   GFR, Estimated 05/24/2024 >60  >60 mL/min Final   Comment: (NOTE) Calculated using the CKD-EPI Creatinine Equation (2021)    Anion gap 05/24/2024 11  5 - 15 Final   Performed at [facility], [address], [place], [place] [address]  Admission on 05/08/2024, Discharged on 05/10/2024  Component Date Value Ref Range Status   Glucose-Capillary 05/08/2024 105 (H)  70 - 99 mg/dL Final   Glucose reference range applies only to samples taken after fasting for at least 8 hours.   Troponin I (High Sensitivity) 05/08/2024 6  <18 ng/L Final   Comment: (NOTE) Elevated high sensitivity troponin I (hsTnI) values and significant  changes across serial measurements may suggest ACS but many other  chronic and acute conditions are known to elevate hsTnI results.  Refer to the Links section for chest pain algorithms and additional  guidance. Performed at [facility], [address] [place], [place] [address]    WBC 05/08/2024 13.3 (H)  4.0 - 10.5 K/uL Final   RBC 05/08/2024 5.23  4.22 - 5.81 MIL/uL Final   Hemoglobin 05/08/2024 14.8  13.0 - 17.0 g/dL Final   HCT 93/74/7974 46.3  39.0 - 52.0 % Final   MCV 05/08/2024 88.5  80.0 - 100.0 fL Final   MCH  05/08/2024 28.3  26.0 - 34.0 pg Final   MCHC 05/08/2024 32.0  30.0 - 36.0 g/dL Final   RDW 93/74/7974 14.4  11.5 - 15.5 % Final   Platelets 05/08/2024 335  150 - 400 K/uL Final   nRBC 05/08/2024 0.0  0.0 - 0.2 % Final   Performed at [facility], [address] [place], [place] [address]   Sodium 05/08/2024 142  135 - 145 mmol/L Final   Potassium 05/08/2024 4.2  3.5 - 5.1 mmol/L Final   Chloride 05/08/2024 103  98 - 111 mmol/L Final   CO2 05/08/2024 10 (L)  22 - 32 mmol/L Final   Glucose, Bld 05/08/2024 99  70 - 99 mg/dL Final   Glucose reference range applies only to samples taken after fasting for at least 8 hours.   BUN 05/08/2024 8  6 - 20 mg/dL Final   Creatinine, Ser 05/08/2024 1.31 (H)  0.61 - 1.24 mg/dL Final   Calcium  05/08/2024 10.3  8.9 - 10.3 mg/dL Final   Total Protein 93/74/7974 9.4 (H)  6.5 - 8.1 g/dL Final   Albumin 93/74/7974 4.8  3.5 - 5.0 g/dL Final   AST 93/74/7974 35  15 - 41 U/L Final   ALT 05/08/2024 15  0 - 44 U/L Final   Alkaline Phosphatase 05/08/2024 109  38 - 126 U/L Final   Total Bilirubin 05/08/2024 2.4 (H)  0.0 - 1.2 mg/dL Final   GFR, Estimated 05/08/2024 >60  >60 mL/min Final   Comment: (NOTE) Calculated using the CKD-EPI Creatinine Equation (2021)    Anion gap 05/08/2024 29 (H)  5 - 15 Final   Comment: ELECTROLYTES REPEATED TO VERIFY Performed at [facility], [address], [place], [place] [address]    Troponin I (High Sensitivity) 05/08/2024 7  <18 ng/L Final   Comment: (NOTE) Elevated high sensitivity troponin I (hsTnI) values and significant  changes across serial measurements may suggest ACS but many other  chronic and acute  conditions are known to elevate hsTnI results.  Refer to the Links section for chest pain algorithms and additional  guidance. Performed at [facility], [address], [place], [place] [address]    Lipase 05/08/2024 34  11 - 51 U/L Final   Performed at [facility], [address],  [place], [place] [address]   Alcohol, Ethyl (B) 05/09/2024 <15  <15 mg/dL Final   Comment: (NOTE) For medical purposes only. Performed at [facility], [address], [place], [place] [address]    Lactic Acid, Venous 05/09/2024 1.2  0.5 - 1.9 mmol/L Final   Performed at [facility], [address], [place], [place] [address]   Color, Urine 05/09/2024 YELLOW  YELLOW Final   APPearance 05/09/2024 CLEAR  CLEAR Final   Specific Gravity, Urine 05/09/2024 1.039 (H)  1.005 - 1.030 Final   pH 05/09/2024 5.0  5.0 - 8.0 Final   Glucose, UA 05/09/2024 NEGATIVE  NEGATIVE mg/dL Final   Hgb urine dipstick 05/09/2024 MODERATE (A)  NEGATIVE Final   Bilirubin Urine 05/09/2024 NEGATIVE  NEGATIVE Final   Ketones, ur 05/09/2024 80 (A)  NEGATIVE mg/dL Final   Protein, ur 93/73/7974 >=300 (A)  NEGATIVE mg/dL Final   Nitrite 93/73/7974 NEGATIVE  NEGATIVE Final   Leukocytes,Ua 05/09/2024 NEGATIVE  NEGATIVE Final   RBC / HPF 05/09/2024 6-10  0 - 5 RBC/hpf Final   WBC, UA 05/09/2024 0-5  0 - 5 WBC/hpf Final   Bacteria, UA 05/09/2024 NONE SEEN  NONE SEEN Final   Squamous Epithelial / HPF 05/09/2024 6-10  0 - 5 /HPF Final   Mucus 05/09/2024 PRESENT   Final   Performed at [facility], [address] [place], [place] [address]   Sodium 05/09/2024 132 (L)  135 - 145 mmol/L Final   DELTA CHECK NOTED   Potassium 05/09/2024 3.6  3.5 - 5.1 mmol/L Final   Chloride 05/09/2024 102  98 - 111 mmol/L Final   CO2 05/09/2024 21 (L)  22 - 32 mmol/L Final   Glucose, Bld 05/09/2024 88  70 - 99 mg/dL Final   Glucose reference range applies only to samples taken after fasting for at least 8 hours.   BUN 05/09/2024 6  6 - 20 mg/dL Final   Creatinine, Ser 05/09/2024 0.74  0.61 - 1.24 mg/dL Final   Calcium  05/09/2024 9.5  8.9 - 10.3 mg/dL Final   GFR, Estimated 05/09/2024 >60  >60 mL/min Final   Comment: (NOTE) Calculated using the CKD-EPI Creatinine Equation (2021)    Anion gap 05/09/2024 9  5 - 15 Final    Performed at [facility], [address], [place], [place] [address]   Total Protein 05/09/2024 7.2  6.5 - 8.1 g/dL Final   Albumin 93/73/7974 3.8  3.5 - 5.0 g/dL Final   AST 93/73/7974 32  15 - 41 U/L Final   ALT 05/09/2024 15  0 - 44 U/L Final   Alkaline Phosphatase 05/09/2024 92  38 - 126 U/L Final   Total Bilirubin 05/09/2024 1.8 (H)  0.0 - 1.2 mg/dL Final   Bilirubin, Direct 05/09/2024 0.4 (H)  0.0 - 0.2 mg/dL Final   Indirect Bilirubin 05/09/2024 1.4 (H)  0.3 - 0.9 mg/dL Final   Performed at [facility], [address], [place], [place] [address]   pH, Ven 05/09/2024 7.43  7.25 - 7.43 Final   pCO2, Ven 05/09/2024 32 (L)  44 - 60  mmHg Final   pO2, Ven 05/09/2024 50 (H)  32 - 45 mmHg Final   Bicarbonate 05/09/2024 21.2  20.0 - 28.0 mmol/L Final   Acid-base deficit 05/09/2024 2.3 (H)  0.0 - 2.0 mmol/L Final   O2 Saturation 05/09/2024 80.6  % Final   Patient temperature 05/09/2024 37.0   Final   Performed at [facility], [address] [place], [place] [address]   WBC 05/09/2024 7.9  4.0 - 10.5 K/uL Final   RBC 05/09/2024 4.75  4.22 - 5.81 MIL/uL Final   Hemoglobin 05/09/2024 13.5  13.0 - 17.0 g/dL Final   HCT 93/73/7974 40.5  39.0 - 52.0 % Final   MCV 05/09/2024 85.3  80.0 - 100.0 fL Final   MCH 05/09/2024 28.4  26.0 - 34.0 pg Final   MCHC 05/09/2024 33.3  30.0 - 36.0 g/dL Final   RDW 93/73/7974 14.2  11.5 - 15.5 % Final   Platelets 05/09/2024 260  150 - 400 K/uL Final   nRBC 05/09/2024 0.0  0.0 - 0.2 % Final   Performed at [facility], [address] [place], [place] [address]  Admission on 04/03/2024, Discharged on 04/12/2024  Component Date Value Ref Range Status   Sodium 04/04/2024 135  135 - 145 mmol/L Final   Potassium 04/04/2024 3.8  3.5 - 5.1 mmol/L Final   Chloride 04/04/2024 102  98 - 111 mmol/L Final   CO2 04/04/2024 22  22 - 32 mmol/L Final   Glucose, Bld 04/04/2024 109 (H)  70 - 99 mg/dL Final   Glucose reference range applies only to  samples taken after fasting for at least 8 hours.   BUN 04/04/2024 6  6 - 20 mg/dL Final   Creatinine, Ser 04/04/2024 0.74  0.61 - 1.24 mg/dL Final   Calcium  04/04/2024 9.1  8.9 - 10.3 mg/dL Final   GFR, Estimated 04/04/2024 >60  >60 mL/min Final   Comment: (NOTE) Calculated using the CKD-EPI Creatinine Equation (2021)    Anion gap 04/04/2024 11  5 - 15 Final   Performed at [facility], [address], [place], [place] [address]   Cholesterol 04/04/2024 169  0 - 200 mg/dL Final   Triglycerides 94/77/7974 195 (H)  <150 mg/dL Final   HDL 94/77/7974 56  >40 mg/dL Final   Total CHOL/HDL Ratio 04/04/2024 3.0  RATIO Final   VLDL 04/04/2024 39  0 - 40 mg/dL Final   LDL Cholesterol 04/04/2024 74  0 - 99 mg/dL Final   Comment:        Total Cholesterol/HDL:CHD Risk Coronary Heart Disease Risk Table                     Men   Women  1/2 Average Risk   3.4   3.3  Average Risk       5.0   4.4  2 X Average Risk   9.6   7.1  3 X Average Risk  23.4   11.0        Use the calculated Patient Ratio above and the CHD Risk Table to determine the patient's CHD Risk.        ATP III CLASSIFICATION (LDL):  <100     mg/dL   Optimal  899-870  mg/dL   Near or Above                    Optimal  130-159  mg/dL   Borderline  839-810  mg/dL   High  >809     mg/dL   Very High Performed at [facility], [address], [place], [place] [address]   Admission on 04/02/2024, Discharged on 04/03/2024  Component Date Value Ref Range Status   Sodium 04/02/2024 137  135 - 145 mmol/L Final   Potassium 04/02/2024 3.3 (L)  3.5 - 5.1 mmol/L Final   Chloride 04/02/2024 102  98 - 111 mmol/L Final   CO2 04/02/2024 23  22 - 32 mmol/L Final   Glucose, Bld 04/02/2024 90  70 - 99 mg/dL Final   Glucose reference range applies only to samples taken after fasting for at least 8 hours.   BUN 04/02/2024 <5 (L)  6 - 20 mg/dL Final   Creatinine, Ser 04/02/2024 0.77  0.61 - 1.24 mg/dL Final    Calcium  04/02/2024 9.6  8.9 - 10.3 mg/dL Final   Total Protein 94/79/7974 8.1  6.5 - 8.1 g/dL Final   Albumin 94/79/7974 3.8  3.5 - 5.0 g/dL Final   AST 94/79/7974 38  15 - 41 U/L Final   ALT 04/02/2024 23  0 - 44 U/L Final   Alkaline Phosphatase 04/02/2024 116  38 - 126 U/L Final   Total Bilirubin 04/02/2024 0.8  0.0 - 1.2 mg/dL Final   GFR, Estimated 04/02/2024 >60  >60 mL/min Final   Comment: (NOTE) Calculated using the CKD-EPI Creatinine Equation (2021)    Anion gap 04/02/2024 12  5 - 15 Final   Performed at [facility], [address] [place], [place] [address]   Alcohol, Ethyl (B) 04/02/2024 257 (H)  <15 mg/dL Final   Comment: Please note change in reference range. (NOTE) For medical purposes only. Performed at [facility], [address] [place], [place] [address]    Opiates 04/02/2024 NONE DETECTED  NONE DETECTED Final   Cocaine 04/02/2024 NONE DETECTED  NONE DETECTED Final   Benzodiazepines 04/02/2024 POSITIVE (A)  NONE DETECTED Final   Amphetamines 04/02/2024 NONE DETECTED  NONE DETECTED Final   Tetrahydrocannabinol 04/02/2024 NONE DETECTED  NONE DETECTED Final   Barbiturates 04/02/2024 NONE DETECTED  NONE DETECTED Final   Comment: (NOTE) DRUG SCREEN FOR MEDICAL PURPOSES ONLY.  IF CONFIRMATION IS NEEDED FOR ANY PURPOSE, NOTIFY LAB WITHIN 5 DAYS.  LOWEST DETECTABLE LIMITS FOR URINE DRUG SCREEN Drug Class                     Cutoff (ng/mL) Amphetamine and metabolites    1000 Barbiturate and metabolites    200 Benzodiazepine                 200 Opiates and metabolites        300 Cocaine and metabolites        300 THC                            50 Performed at [facility], [address] [place], [place] [address]    WBC 04/02/2024 6.3  4.0 - 10.5 K/uL Final   RBC 04/02/2024 4.44  4.22 - 5.81 MIL/uL Final   Hemoglobin 04/02/2024 12.7 (L)  13.0 - 17.0 g/dL Final   HCT 94/79/7974 39.1  39.0 - 52.0 %  Final   MCV 04/02/2024 88.1  80.0 - 100.0 fL Final   MCH 04/02/2024 28.6  26.0 - 34.0 pg Final   MCHC 04/02/2024 32.5  30.0 - 36.0 g/dL  Final   RDW 04/02/2024 14.5  11.5 - 15.5 % Final   Platelets 04/02/2024 448 (H)  150 - 400 K/uL Final   nRBC 04/02/2024 0.0  0.0 - 0.2 % Final   Neutrophils Relative % 04/02/2024 63  % Final   Neutro Abs 04/02/2024 3.9  1.7 - 7.7 K/uL Final   Lymphocytes Relative 04/02/2024 25  % Final   Lymphs Abs 04/02/2024 1.6  0.7 - 4.0 K/uL Final   Monocytes Relative 04/02/2024 10  % Final   Monocytes Absolute 04/02/2024 0.7  0.1 - 1.0 K/uL Final   Eosinophils Relative 04/02/2024 1  % Final   Eosinophils Absolute 04/02/2024 0.1  0.0 - 0.5 K/uL Final   Basophils Relative 04/02/2024 1  % Final   Basophils Absolute 04/02/2024 0.1  0.0 - 0.1 K/uL Final   Immature Granulocytes 04/02/2024 0  % Final   Abs Immature Granulocytes 04/02/2024 0.02  0.00 - 0.07 K/uL Final   Performed at [facility], [address], [place], [place] [address]  No results displayed because visit has over 200 results.    Admission on 03/15/2024, Discharged on 03/16/2024  Component Date Value Ref Range Status   Glucose-Capillary 03/15/2024 97  70 - 99 mg/dL Final   Glucose reference range applies only to samples taken after fasting for at least 8 hours.   Sodium 03/15/2024 142  135 - 145 mmol/L Final   Potassium 03/15/2024 3.3 (L)  3.5 - 5.1 mmol/L Final   Chloride 03/15/2024 101  98 - 111 mmol/L Final   CO2 03/15/2024 19 (L)  22 - 32 mmol/L Final   Glucose, Bld 03/15/2024 90  70 - 99 mg/dL Final   Glucose reference range applies only to samples taken after fasting for at least 8 hours.   BUN 03/15/2024 <5 (L)  6 - 20 mg/dL Final   QA FLAGS AND/OR RANGES MODIFIED BY DEMOGRAPHIC UPDATE ON 05/02 AT 1931   Creatinine, Ser 03/15/2024 0.79  0.61 - 1.24 mg/dL Final   Calcium  03/15/2024 8.6 (L)  8.9 - 10.3 mg/dL Final   Total Protein 94/97/7974 8.0  6.5 - 8.1 g/dL Final    Albumin 94/97/7974 4.0  3.5 - 5.0 g/dL Final   AST 94/97/7974 55 (H)  15 - 41 U/L Final   ALT 03/15/2024 21  0 - 44 U/L Final   Alkaline Phosphatase 03/15/2024 92  38 - 126 U/L Final   Total Bilirubin 03/15/2024 0.7  0.0 - 1.2 mg/dL Final   GFR, Estimated 03/15/2024 59 (L)  >60 mL/min Final   Comment: (NOTE) Calculated using the CKD-EPI Creatinine Equation (2021)    Anion gap 03/15/2024 22 (H)  5 - 15 Final   Comment: ELECTROLYTES REPEATED TO VERIFY Performed at [facility], [address], [place], [place] [address]    WBC 03/15/2024 9.4  4.0 - 10.5 K/uL Final   RBC 03/15/2024 4.64  4.22 - 5.81 MIL/uL Final   Hemoglobin 03/15/2024 13.3  13.0 - 17.0 g/dL Final   HCT 94/97/7974 41.4  39.0 - 52.0 % Final   MCV 03/15/2024 89.2  80.0 - 100.0 fL Final   MCH 03/15/2024 28.7  26.0 - 34.0 pg Final   MCHC 03/15/2024 32.1  30.0 - 36.0 g/dL Final   RDW 94/97/7974 14.0  11.5 - 15.5 % Final   Platelets 03/15/2024 302  150 - 400 K/uL Final   nRBC 03/15/2024 0.0  0.0 - 0.2 % Final   Neutrophils Relative % 03/15/2024  77  % Final   Neutro Abs 03/15/2024 7.3  1.7 - 7.7 K/uL Final   Lymphocytes Relative 03/15/2024 14  % Final   Lymphs Abs 03/15/2024 1.3  0.7 - 4.0 K/uL Final   Monocytes Relative 03/15/2024 7  % Final   Monocytes Absolute 03/15/2024 0.7  0.1 - 1.0 K/uL Final   Eosinophils Relative 03/15/2024 0  % Final   Eosinophils Absolute 03/15/2024 0.0  0.0 - 0.5 K/uL Final   Basophils Relative 03/15/2024 1  % Final   Basophils Absolute 03/15/2024 0.1  0.0 - 0.1 K/uL Final   Immature Granulocytes 03/15/2024 1  % Final   Abs Immature Granulocytes 03/15/2024 0.05  0.00 - 0.07 K/uL Final   Performed at [facility], [address], [place], [place] [address]   Ammonia 03/15/2024 36 (H)  9 - 35 umol/L Final   Performed at [facility], [address] [place], [place] [address]   Sodium 03/15/2024 142  135 - 145 mmol/L Final   Potassium 03/15/2024 3.1 (L)  3.5 - 5.1 mmol/L Final    Chloride 03/15/2024 102  98 - 111 mmol/L Final   BUN 03/15/2024 <3 (L)  6 - 20 mg/dL Final   QA FLAGS AND/OR RANGES MODIFIED BY DEMOGRAPHIC UPDATE ON 05/02 AT 1931   Creatinine, Ser 03/15/2024 1.30 (H)  0.61 - 1.24 mg/dL Final   Glucose, Bld 94/97/7974 93  70 - 99 mg/dL Final   Glucose reference range applies only to samples taken after fasting for at least 8 hours.   Calcium , Ion 03/15/2024 0.89 (LL)  1.15 - 1.40 mmol/L Final   TCO2 03/15/2024 20 (L)  22 - 32 mmol/L Final   Hemoglobin 03/15/2024 15.0  13.0 - 17.0 g/dL Final   HCT 94/97/7974 44.0  39.0 - 52.0 % Final   Comment 03/15/2024 NOTIFIED PHYSICIAN   Final   Opiates 03/15/2024 NONE DETECTED  NONE DETECTED Final   Cocaine 03/15/2024 NONE DETECTED  NONE DETECTED Final   Benzodiazepines 03/15/2024 POSITIVE (A)  NONE DETECTED Final   Amphetamines 03/15/2024 NONE DETECTED  NONE DETECTED Final   Tetrahydrocannabinol 03/15/2024 NONE DETECTED  NONE DETECTED Final   Barbiturates 03/15/2024 NONE DETECTED  NONE DETECTED Final   Comment: (NOTE) DRUG SCREEN FOR MEDICAL PURPOSES ONLY.  IF CONFIRMATION IS NEEDED FOR ANY PURPOSE, NOTIFY LAB WITHIN 5 DAYS.  LOWEST DETECTABLE LIMITS FOR URINE DRUG SCREEN Drug Class                     Cutoff (ng/mL) Amphetamine and metabolites    1000 Barbiturate and metabolites    200 Benzodiazepine                 200 Opiates and metabolites        300 Cocaine and metabolites        300 THC                            50 Performed at [facility], [address], [place], [place] [address]    Alcohol, Ethyl (B) 03/15/2024 495 (HH)  <15 mg/dL Final   Comment: CRITICAL RESULT CALLED TO, READ BACK BY AND VERIFIED WITH C WELLS RN 03/15/2024 1823 BNUNNERY Please note change in reference range. (NOTE) For medical purposes only. Performed at [facility], [name] [place], [place] [address]   Admission on 01/25/2024, Discharged on 02/03/2024  Component  Date Value Ref Range Status    Folate 01/29/2024 22.8  >5.9 ng/mL Final   Performed at [facility], [address] [place], [place] [address]   Hgb A1c MFr Bld 01/29/2024 4.5 (L)  4.8 - 5.6 % Final   Comment: (NOTE) Pre diabetes:          5.7%-6.4%  Diabetes:              >6.4%  Glycemic control for   <7.0% adults with diabetes    Mean Plasma Glucose 01/29/2024 82.45  mg/dL Final   Performed at [facility], [address], [place], [place] [address]   Cholesterol 01/29/2024 174  0 - 200 mg/dL Final   Triglycerides 96/82/7974 165 (H)  <150 mg/dL Final   HDL 96/82/7974 50  >40 mg/dL Final   Total CHOL/HDL Ratio 01/29/2024 3.5  RATIO Final   VLDL 01/29/2024 33  0 - 40 mg/dL Final   LDL Cholesterol 01/29/2024 91  0 - 99 mg/dL Final   Comment:        Total Cholesterol/HDL:CHD Risk Coronary Heart Disease Risk Table                     Men   Women  1/2 Average Risk   3.4   3.3  Average Risk       5.0   4.4  2 X Average Risk   9.6   7.1  3 X Average Risk  23.4   11.0        Use the calculated Patient Ratio above and the CHD Risk Table to determine the patient's CHD Risk.        ATP III CLASSIFICATION (LDL):  <100     mg/dL   Optimal  899-870  mg/dL   Near or Above                    Optimal  130-159  mg/dL   Borderline  839-810  mg/dL   High  >809     mg/dL   Very High Performed at [facility], [address] [place], [place] [address]    RPR Ser Ql 01/29/2024 NON REACTIVE  NON REACTIVE Final   Performed at [facility], [address], [place], [place] [address]   TSH 01/29/2024 4.117  0.350 - 4.500 uIU/mL Final   Comment: Performed by a 3rd Generation assay with a functional sensitivity of <=0.01 uIU/mL. Performed at [facility], [address], [place], [place] [address]    Vitamin B-12 01/29/2024 585  180 - 914 pg/mL Final   Comment: (NOTE) This assay is not validated for testing neonatal or myeloproliferative syndrome  specimens for Vitamin B12 levels. Performed at [facility], [address], [place], [place] [address]    Vit D, 25-Hydroxy 01/29/2024 29.42 (L)  30 - 100 ng/mL Final   Comment: (NOTE) Vitamin D  deficiency has been defined by the Institute of Medicine  and an Endocrine Society practice guideline as a level of serum 25-OH  vitamin D  less than 20 ng/mL (1,2). The Endocrine Society went on to  further define vitamin D  insufficiency as a level between 21 and 29  ng/mL (2).  1. IOM (Institute of Medicine). 2010. Dietary reference intakes for  calcium  and D. [place]: [employer]. 2. Holick MF, [place], Bischoff-Ferrari HA, et al. Evaluation,  treatment, and prevention of vitamin D   deficiency: an Endocrine  Society clinical practice guideline, JCEM. 2011 Jul; 96(7): 1911-30.  Performed at [facility], [address] [place], [place] [address]    Free T4 02/01/2024 0.74  0.61 - 1.12 ng/dL Final   Comment: (NOTE) Biotin ingestion may interfere with free T4 tests. If the results are inconsistent with the TSH level, previous test results, or the clinical presentation, then consider biotin interference. If needed, order repeat testing after stopping biotin. Performed at [facility], [address] [place], [place] [address]   Admission on 01/23/2024, Discharged on 01/25/2024  Component Date Value Ref Range Status   Glucose-Capillary 01/23/2024 111 (H)  70 - 99 mg/dL Final   Glucose reference range applies only to samples taken after fasting for at least 8 hours.   Sodium 01/23/2024 133 (L)  135 - 145 mmol/L Final   Potassium 01/23/2024 4.3  3.5 - 5.1 mmol/L Final   Chloride 01/23/2024 102  98 - 111 mmol/L Final   CO2 01/23/2024 23  22 - 32 mmol/L Final   Glucose, Bld 01/23/2024 100 (H)  70 - 99 mg/dL Final   Glucose reference range applies only to samples taken after fasting for at least 8 hours.   BUN 01/23/2024 8  6 - 20  mg/dL Final   Creatinine, Ser 01/23/2024 0.92  0.61 - 1.24 mg/dL Final   Calcium  01/23/2024 9.4  8.9 - 10.3 mg/dL Final   Total Protein 96/88/7974 7.4  6.5 - 8.1 g/dL Final   Albumin 96/88/7974 3.6  3.5 - 5.0 g/dL Final   AST 96/88/7974 125 (H)  15 - 41 U/L Final   ALT 01/23/2024 57 (H)  0 - 44 U/L Final   Alkaline Phosphatase 01/23/2024 85  38 - 126 U/L Final   Total Bilirubin 01/23/2024 0.7  0.0 - 1.2 mg/dL Final   GFR, Estimated 01/23/2024 >60  >60 mL/min Final   Comment: (NOTE) Calculated using the CKD-EPI Creatinine Equation (2021)    Anion gap 01/23/2024 8  5 - 15 Final   Performed at [facility], [address] [place], [place] [address]   WBC 01/23/2024 5.2  4.0 - 10.5 K/uL Final   RBC 01/23/2024 3.69 (L)  4.22 - 5.81 MIL/uL Final   Hemoglobin 01/23/2024 11.7 (L)  13.0 - 17.0 g/dL Final   HCT 96/88/7974 35.7 (L)  39.0 - 52.0 % Final   MCV 01/23/2024 96.7  80.0 - 100.0 fL Final   MCH 01/23/2024 31.7  26.0 - 34.0 pg Final   MCHC 01/23/2024 32.8  30.0 - 36.0 g/dL Final   RDW 96/88/7974 13.8  11.5 - 15.5 % Final   Platelets 01/23/2024 291  150 - 400 K/uL Final   nRBC 01/23/2024 0.0  0.0 - 0.2 % Final   Neutrophils Relative % 01/23/2024 65  % Final   Neutro Abs 01/23/2024 3.3  1.7 - 7.7 K/uL Final   Lymphocytes Relative 01/23/2024 16  % Final   Lymphs Abs 01/23/2024 0.8  0.7 - 4.0 K/uL Final   Monocytes Relative 01/23/2024 15  % Final   Monocytes Absolute 01/23/2024 0.8  0.1 - 1.0 K/uL Final   Eosinophils Relative 01/23/2024 3  % Final   Eosinophils Absolute 01/23/2024 0.2  0.0 - 0.5 K/uL Final   Basophils Relative 01/23/2024 1  % Final   Basophils Absolute 01/23/2024 0.1  0.0 - 0.1 K/uL Final   Immature Granulocytes 01/23/2024 0  % Final   Abs Immature Granulocytes 01/23/2024  0.02  0.00 - 0.07 K/uL Final   Performed at [facility], [address] [place], [place] [address]   Magnesium  01/23/2024 2.1  1.7 - 2.4 mg/dL Final   Performed at [facility]  Lab, [address], [place], [place] [address]   Alcohol, Ethyl (B) 01/23/2024 <10  <10 mg/dL Final   Comment: (NOTE) Lowest detectable limit for serum alcohol is 10 mg/dL.  For medical purposes only. Performed at [facility], [address] [place], [place] [address]    Opiates 01/23/2024 NONE DETECTED  NONE DETECTED Final   Cocaine 01/23/2024 NONE DETECTED  NONE DETECTED Final   Benzodiazepines 01/23/2024 POSITIVE (A)  NONE DETECTED Final   Amphetamines 01/23/2024 NONE DETECTED  NONE DETECTED Final   Tetrahydrocannabinol 01/23/2024 NONE DETECTED  NONE DETECTED Final   Barbiturates 01/23/2024 NONE DETECTED  NONE DETECTED Final   Comment: (NOTE) DRUG SCREEN FOR MEDICAL PURPOSES ONLY.  IF CONFIRMATION IS NEEDED FOR ANY PURPOSE, NOTIFY LAB WITHIN 5 DAYS.  LOWEST DETECTABLE LIMITS FOR URINE DRUG SCREEN Drug Class                     Cutoff (ng/mL) Amphetamine and metabolites    1000 Barbiturate and metabolites    200 Benzodiazepine                 200 Opiates and metabolites        300 Cocaine and metabolites        300 THC                            50 Performed at [facility], [address], [place], [place] [address]    Color, Urine 01/23/2024 YELLOW  YELLOW Final   APPearance 01/23/2024 CLEAR  CLEAR Final   Specific Gravity, Urine 01/23/2024 1.011  1.005 - 1.030 Final   pH 01/23/2024 6.0  5.0 - 8.0 Final   Glucose, UA 01/23/2024 NEGATIVE  NEGATIVE mg/dL Final   Hgb urine dipstick 01/23/2024 NEGATIVE  NEGATIVE Final   Bilirubin Urine 01/23/2024 NEGATIVE  NEGATIVE Final   Ketones, ur 01/23/2024 NEGATIVE  NEGATIVE mg/dL Final   Protein, ur 96/88/7974 NEGATIVE  NEGATIVE mg/dL Final   Nitrite 96/88/7974 NEGATIVE  NEGATIVE Final   Leukocytes,Ua 01/23/2024 NEGATIVE  NEGATIVE Final   Performed at [facility], [address], [place], [place] [address]   Glucose-Capillary 01/23/2024 87  70 - 99 mg/dL Final   Glucose reference range applies only to samples  taken after fasting for at least 8 hours.   Sodium 01/23/2024 133 (L)  135 - 145 mmol/L Final   Potassium 01/23/2024 4.3  3.5 - 5.1 mmol/L Final   Chloride 01/23/2024 101  98 - 111 mmol/L Final   BUN 01/23/2024 9  6 - 20 mg/dL Final   Creatinine, Ser 01/23/2024 0.90  0.61 - 1.24 mg/dL Final   Glucose, Bld 96/88/7974 93  70 - 99 mg/dL Final   Glucose reference range applies only to samples taken after fasting for at least 8 hours.   Calcium , Ion 01/23/2024 1.05 (L)  1.15 - 1.40 mmol/L Final   TCO2 01/23/2024 25  22 - 32 mmol/L Final   Hemoglobin 01/23/2024 12.2 (L)  13.0 - 17.0 g/dL Final   HCT 96/88/7974 36.0 (L)  39.0 - 52.0 % Final   HIV Screen 4th Generation wRfx 01/24/2024 Non Reactive  Non Reactive Final  Performed at [facility], [address] [place], [place] [address]   WBC 01/24/2024 4.5  4.0 - 10.5 K/uL Final   RBC 01/24/2024 3.75 (L)  4.22 - 5.81 MIL/uL Final   Hemoglobin 01/24/2024 11.8 (L)  13.0 - 17.0 g/dL Final   HCT 96/87/7974 35.2 (L)  39.0 - 52.0 % Final   MCV 01/24/2024 93.9  80.0 - 100.0 fL Final   MCH 01/24/2024 31.5  26.0 - 34.0 pg Final   MCHC 01/24/2024 33.5  30.0 - 36.0 g/dL Final   RDW 96/87/7974 13.8  11.5 - 15.5 % Final   Platelets 01/24/2024 301  150 - 400 K/uL Final   nRBC 01/24/2024 0.0  0.0 - 0.2 % Final   Performed at [facility], [address], [place], [place] [address]   Sodium 01/24/2024 135  135 - 145 mmol/L Final   Potassium 01/24/2024 3.9  3.5 - 5.1 mmol/L Final   Chloride 01/24/2024 104  98 - 111 mmol/L Final   CO2 01/24/2024 22  22 - 32 mmol/L Final   Glucose, Bld 01/24/2024 96  70 - 99 mg/dL Final   Glucose reference range applies only to samples taken after fasting for at least 8 hours.   BUN 01/24/2024 8  6 - 20 mg/dL Final   Creatinine, Ser 01/24/2024 0.96  0.61 - 1.24 mg/dL Final   Calcium  01/24/2024 9.5  8.9 - 10.3 mg/dL Final   Total Protein 96/87/7974 7.5  6.5 - 8.1 g/dL Final   Albumin 96/87/7974 3.6  3.5 - 5.0  g/dL Final   AST 96/87/7974 86 (H)  15 - 41 U/L Final   ALT 01/24/2024 51 (H)  0 - 44 U/L Final   Alkaline Phosphatase 01/24/2024 81  38 - 126 U/L Final   Total Bilirubin 01/24/2024 0.7  0.0 - 1.2 mg/dL Final   GFR, Estimated 01/24/2024 >60  >60 mL/min Final   Comment: (NOTE) Calculated using the CKD-EPI Creatinine Equation (2021)    Anion gap 01/24/2024 9  5 - 15 Final   Performed at [facility], [address] [place], [place] [address]   WBC 01/25/2024 4.6  4.0 - 10.5 K/uL Final   RBC 01/25/2024 3.72 (L)  4.22 - 5.81 MIL/uL Final   Hemoglobin 01/25/2024 11.7 (L)  13.0 - 17.0 g/dL Final   HCT 96/86/7974 35.1 (L)  39.0 - 52.0 % Final   MCV 01/25/2024 94.4  80.0 - 100.0 fL Final   MCH 01/25/2024 31.5  26.0 - 34.0 pg Final   MCHC 01/25/2024 33.3  30.0 - 36.0 g/dL Final   RDW 96/86/7974 13.8  11.5 - 15.5 % Final   Platelets 01/25/2024 292  150 - 400 K/uL Final   nRBC 01/25/2024 0.0  0.0 - 0.2 % Final   Performed at [facility], [address], [place], [place] [address]   Sodium 01/25/2024 138  135 - 145 mmol/L Final   Potassium 01/25/2024 4.3  3.5 - 5.1 mmol/L Final   Chloride 01/25/2024 104  98 - 111 mmol/L Final   CO2 01/25/2024 24  22 - 32 mmol/L Final   Glucose, Bld 01/25/2024 93  70 - 99 mg/dL Final   Glucose reference range applies only to samples taken after fasting for at least 8 hours.   BUN 01/25/2024 8  6 - 20 mg/dL Final   Creatinine, Ser 01/25/2024 0.79  0.61 - 1.24 mg/dL Final   Calcium  01/25/2024 9.2  8.9 -  10.3 mg/dL Final   GFR, Estimated 01/25/2024 >60  >60 mL/min Final   Comment: (NOTE) Calculated using the CKD-EPI Creatinine Equation (2021)    Anion gap 01/25/2024 10  5 - 15 Final   Performed at [facility], [address] [place], [place] [address]  Admission on 01/18/2024, Discharged on 01/23/2024  Component Date Value Ref Range Status   POC Amphetamine UR 01/18/2024 None Detected  NONE DETECTED (Cut Off Level 1000 ng/mL) Final   POC  Secobarbital (BAR) 01/18/2024 None Detected  NONE DETECTED (Cut Off Level 300 ng/mL) Final   POC Buprenorphine (BUP) 01/18/2024 None Detected  NONE DETECTED (Cut Off Level 10 ng/mL) Final   POC Oxazepam (BZO) 01/18/2024 Positive (A)  NONE DETECTED (Cut Off Level 300 ng/mL) Final   POC Cocaine UR 01/18/2024 None Detected  NONE DETECTED (Cut Off Level 300 ng/mL) Final   POC Methamphetamine UR 01/18/2024 None Detected  NONE DETECTED (Cut Off Level 1000 ng/mL) Final   POC Morphine  01/18/2024 None Detected  NONE DETECTED (Cut Off Level 300 ng/mL) Final   POC Methadone UR 01/18/2024 None Detected  NONE DETECTED (Cut Off Level 300 ng/mL) Final   POC Oxycodone  UR 01/18/2024 None Detected  NONE DETECTED (Cut Off Level 100 ng/mL) Final   POC Marijuana UR 01/18/2024 Positive (A)  NONE DETECTED (Cut Off Level 50 ng/mL) Final   Alcohol, Ethyl (B) 01/18/2024 <10  <10 mg/dL Final   Comment: (NOTE) Lowest detectable limit for serum alcohol is 10 mg/dL.  For medical purposes only. Performed at [facility], [address] [place], [place] [address]    Sodium 01/18/2024 138  135 - 145 mmol/L Final   Potassium 01/18/2024 3.7  3.5 - 5.1 mmol/L Final   Chloride 01/18/2024 95 (L)  98 - 111 mmol/L Final   CO2 01/18/2024 26  22 - 32 mmol/L Final   Glucose, Bld 01/18/2024 94  70 - 99 mg/dL Final   Glucose reference range applies only to samples taken after fasting for at least 8 hours.   BUN 01/18/2024 <5 (L)  6 - 20 mg/dL Final   Creatinine, Ser 01/18/2024 0.76  0.61 - 1.24 mg/dL Final   Calcium  01/18/2024 9.2  8.9 - 10.3 mg/dL Final   Total Protein 96/93/7974 7.9  6.5 - 8.1 g/dL Final   Albumin 96/93/7974 3.7  3.5 - 5.0 g/dL Final   AST 96/93/7974 68 (H)  15 - 41 U/L Final   ALT 01/18/2024 23  0 - 44 U/L Final   Alkaline Phosphatase 01/18/2024 111  38 - 126 U/L Final   Total Bilirubin 01/18/2024 1.4 (H)  0.0 - 1.2 mg/dL Final   GFR, Estimated 01/18/2024 >60  >60 mL/min Final   Comment:  (NOTE) Calculated using the CKD-EPI Creatinine Equation (2021)    Anion gap 01/18/2024 17 (H)  5 - 15 Final   Performed at [facility], [address], [place], [place] [address]   Sodium 01/21/2024 134 (L)  135 - 145 mmol/L Final   Potassium 01/21/2024 4.1  3.5 - 5.1 mmol/L Final   Chloride 01/21/2024 99  98 - 111 mmol/L Final   CO2 01/21/2024 23  22 - 32 mmol/L Final   Glucose, Bld 01/21/2024 80  70 - 99 mg/dL Final   Glucose reference range applies only to samples taken after fasting for at least 8 hours.   BUN 01/21/2024 8  6 - 20 mg/dL Final   Creatinine, Ser 01/21/2024 0.69  0.61 - 1.24 mg/dL Final   Calcium  01/21/2024 9.9  8.9 - 10.3 mg/dL Final   Total Protein 96/90/7974 7.8  6.5 - 8.1 g/dL Final   Albumin 96/90/7974 3.8  3.5 - 5.0 g/dL Final   AST 96/90/7974 78 (H)  15 - 41 U/L Final   ALT 01/21/2024 33  0 - 44 U/L Final   Alkaline Phosphatase 01/21/2024 90  38 - 126 U/L Final   Total Bilirubin 01/21/2024 0.7  0.0 - 1.2 mg/dL Final   GFR, Estimated 01/21/2024 >60  >60 mL/min Final   Comment: (NOTE) Calculated using the CKD-EPI Creatinine Equation (2021)    Anion gap 01/21/2024 12  5 - 15 Final   Performed at [facility], [address], [place], [place] [address]   Prothrombin Time 01/21/2024 13.0  11.4 - 15.2 seconds Final   INR 01/21/2024 1.0  0.8 - 1.2 Final   Comment: (NOTE) INR goal varies based on device and disease states. Performed at [facility], [address] [place], [place] [address]    WBC 01/21/2024 5.5  4.0 - 10.5 K/uL Final   RBC 01/21/2024 3.75 (L)  4.22 - 5.81 MIL/uL Final   Hemoglobin 01/21/2024 11.9 (L)  13.0 - 17.0 g/dL Final   HCT 96/90/7974 36.1 (L)  39.0 - 52.0 % Final   MCV 01/21/2024 96.3  80.0 - 100.0 fL Final   MCH 01/21/2024 31.7  26.0 - 34.0 pg Final   MCHC 01/21/2024 33.0  30.0 - 36.0 g/dL Final   RDW 96/90/7974 14.3  11.5 - 15.5 % Final   Platelets 01/21/2024 261  150 - 400 K/uL Final   nRBC 01/21/2024 0.0  0.0  - 0.2 % Final   Performed at [facility], [address], [place], [place] [address]   Magnesium  01/21/2024 1.9  1.7 - 2.4 mg/dL Final   Performed at [facility], [address], [place], [place] [address]  There may be more visits with results that are not included.    Blood Alcohol level:  Lab Results  Component Value Date   [facility] <15 05/27/2024   ETH <15 05/20/2024    Metabolic Disorder Labs: Lab Results  Component Value Date   HGBA1C 4.4 (L) 05/20/2024   MPG 79.58 05/20/2024   MPG 82.45 01/29/2024   No results found for: PROLACTIN Lab Results  Component Value Date   CHOL 208 (H) 05/20/2024   TRIG 168 (H) 05/20/2024   HDL 79 05/20/2024   CHOLHDL 2.6 05/20/2024   VLDL 34 05/20/2024   LDLCALC 95 05/20/2024   LDLCALC 74 04/04/2024    Therapeutic Lab Levels: No results found for: LITHIUM No results found for: VALPROATE No results found for: CBMZ  Physical Findings   AIMS    Flowsheet Row [facility]  AIMS Total Score 0   AUDIT    Flowsheet Row Office Visit from 04/22/2024 in [facility] Admission (Discharged) [facility] Admission (Discharged) from [facility]/25[facility]2024 [facility] Admission (Discharged) [facility]  Alcohol Use Disorder Identification Test Final Score (AUDIT) 6 10 0 29   CAGE-AID    Flowsheet Row ED to Hosp-Admission (Discharged) from 06/22/2021 in [facility]  CAGE-AID Score 3   GAD-7    Flowsheet Row Office Visit from 04/22/2024 in  [facility]  Total GAD-7 Score 0   Mini-Mental    Flowsheet Row Office Visit from 05/02/2024 in [facility]  Total Score (max 30 points ) 21   PHQ2-9    Flowsheet Row ED from 05/27/2024  in [facility] Office Visit from 04/22/2024 in [facility] Office Visit from 02/07/2024 in [facility] [place] ED from 01/18/2024 in [facility] Office Visit from 06/10/2022 in [facility] [place]  PHQ-2 Total Score 6 0 0 0 0  PHQ-9 Total Score 12 0 -- 3 --   Flowsheet Row ED from 05/27/2024 in [facility] Most recent reading at 05/27/2024 11:10 PM ED from 05/27/2024 in [facility] Most recent reading at 05/27/2024  6:22 PM ED to Hosp-Admission (Discharged) from 05/20/2024 in [facility] Most recent reading at 05/20/2024 10:08 AM  C-SSRS RISK CATEGORY No Risk No Risk No Risk     Musculoskeletal  Strength & Muscle Tone: within normal limits Gait & Station: normal Patient leans: N/A  Psychiatric Specialty Exam  Presentation  General Appearance:  Appropriate for Environment  Eye Contact: Fair  Speech: Clear and Coherent  Speech Volume: Normal  Handedness: Right   Mood and Affect  Mood: Euthymic  Affect: Appropriate; Congruent   Thought Process  Thought Processes: Coherent  Descriptions of Associations:Intact  Orientation:Full (Time, Place and Person)  Thought Content:Logical  Diagnosis of Schizophrenia or Schizoaffective disorder in past: No    Hallucinations:Hallucinations: None  Ideas of Reference:None  Suicidal Thoughts:Suicidal Thoughts: No  Homicidal Thoughts:Homicidal Thoughts: No   Sensorium  Memory: Immediate Fair; Recent Poor; Remote Poor  Judgment: Fair  Insight: Shallow   Executive Functions  Concentration: -- (impaired)  Attention Span: -- (impaired)  Recall: -- (impaired)  Fund of Knowledge: -- (impaired)  Language: Fair  Psychomotor Activity  Psychomotor  Activity: Psychomotor Activity: Normal  Assets  Assets: Manufacturing systems engineer; Financial Resources/Insurance; Resilience  Sleep  Sleep: Sleep: Good  Estimated Sleeping Duration (Last 24 Hours): 13.50-14.75 hours  No data recorded  Physical Exam  Physical Exam ROS Blood pressure 122/60, pulse 96, temperature 98.3 F (36.8 C), temperature source Oral, resp. rate 17, SpO2 96%. There is no height or weight on file to calculate BMI.  Treatment Plan Summary: Last dose of lorazepam  was today.  Given patient's complicated withdrawal history we will continue continue to monitor closely (including CIWA) for 48-72hrs. Patient appears ambivalent about entering treatment at this time but is amenable to continuing naltrexone . May explore optimizing naltrexone  versus additional pharmacotherapy. 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 Will order POC glucose x 2 days, if abnormal followup with A1c, insulin on Monday. Patient has history of hypomagnesemia. Consider starting Mg preferably glycinate for deficiency.Discontinue Fe supplement given no current indication (Hgb/HCT/MCV grossly wnl) and possible competition with other important divalent cations Mg/Zn.  KANDI JAYSON HAHN, MD 05/31/2024 4:27 PM

## 2024-05-31 NOTE — Group Note (Signed)
 Group Topic: Overcoming Obstacles  Group Date: 05/31/2024 Start Time: 1000 End Time: 1030 Facilitators: Angie Piercey, Zane HERO, RN  Department: [facility]  Number of Participants: 5  Group Focus: acceptance, activities of daily living skills, affirmation, chemical dependency education, communication, community group, daily focus, forgiveness, and substance abuse education Treatment Modality:  Interpersonal Therapy Interventions utilized were confrontation, exploration, orientation, and support Purpose: enhance coping skills, express feelings, improve communication skills, increase insight, and relapse prevention strategies  Name: Xavier Owens Date of Birth: 14-Jun-1971  MR: [id number]    Level of Participation: active Quality of Participation: attentive and cooperative Interactions with others: gave feedback Mood/Affect: appropriate Triggers (if applicable): None identified at this time Cognition: coherent/clear, insightful, and logical Progress: Gaining insight Response: Patient actively participated in group. No concerns voiced at this time. Plan: patient will be encouraged to continue to attend groups and programming in the milieu.  Patients Problems:  Patient Active Problem List   Diagnosis Date Noted   History of seizure due to alcohol withdrawal 05/30/2024   Gastroesophageal reflux disease without esophagitis 05/30/2024   Hyperlipidemia 05/30/2024   Schatzki's ring 05/23/2024   Encounter to discuss test results 05/22/2024   Non-cardiac chest pain 05/22/2024   Esophageal dysphagia 05/22/2024   Abnormal loss of weight 05/22/2024   Abnormal finding on GI tract imaging 05/22/2024   Chest pain 05/21/2024   Elevated troponin 05/21/2024   Hypotension 05/21/2024   Chest discomfort 05/20/2024   Electrolyte abnormality 05/20/2024   Alcohol withdrawal (HCC) 05/09/2024   AKI (acute kidney injury) (HCC) 05/09/2024   Leukocytosis 05/09/2024   Heat  exhaustion, water deprivation 05/09/2024   Major depression, recurrent (HCC) 04/03/2024   Reactive depression 03/22/2024   Alcohol withdrawal syndrome with complication (HCC) 03/22/2024   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   Dehydration 03/21/2024   Acute lactic acidosis 03/21/2024   Delirium tremens (HCC) 03/18/2024   Nausea & vomiting 03/16/2024   Alcohol use disorder 03/16/2024   Tremor 03/16/2024   Neuropathy 03/16/2024   Mood disorder (HCC) 03/16/2024   Memory impairment 02/08/2024   Right knee pain 02/08/2024   Major depressive disorder, recurrent severe without psychotic features (HCC) 01/25/2024   Alcohol withdrawal seizure (HCC) 01/23/2024   Alcohol use disorder, severe, dependence (HCC) 01/18/2024   Healthcare maintenance 06/10/2022   Nightmares 12/21/2021   Subclinical hypothyroidism 12/21/2021   Substance induced mood disorder (HCC) 12/14/2021   Alcohol use disorder, moderate, dependence (HCC) 06/23/2021   Seizures (HCC) 06/22/2021   Elevated liver enzymes 04/19/2016   Essential hypertension 04/19/2016

## 2024-05-31 NOTE — ED Notes (Signed)
 Pt provided lunch.

## 2024-05-31 NOTE — Group Note (Signed)
 Group Topic: Change and Accountability  Group Date: 05/31/2024 Start Time: 1930 End Time: 2000 Facilitators: Anice Benton LABOR, NT  Department: [facility]  Number of Participants: 2  Group Focus: Abuse Issues Treatment Modality:  Behavior Modification Therapy Interventions utilized were problem solving Purpose: trigger / craving management  Name: Xavier Owens Date of Birth: Mar 03, 1971  MR: [id number]    Level of Participation: minimal Quality of Participation: attentive Interactions with others: intrusive Mood/Affect: appropriate Triggers (if applicable): No Cognition: coherent/clear Progress: Minimal Response: Good Plan: follow-up needed  Patients Problems:  Patient Active Problem List   Diagnosis Date Noted   History of seizure due to alcohol withdrawal 05/30/2024   Gastroesophageal reflux disease without esophagitis 05/30/2024   Hyperlipidemia 05/30/2024   Schatzki's ring 05/23/2024   Encounter to discuss test results 05/22/2024   Non-cardiac chest pain 05/22/2024   Esophageal dysphagia 05/22/2024   Abnormal loss of weight 05/22/2024   Abnormal finding on GI tract imaging 05/22/2024   Chest pain 05/21/2024   Elevated troponin 05/21/2024   Hypotension 05/21/2024   Chest discomfort 05/20/2024   Electrolyte abnormality 05/20/2024   Alcohol withdrawal (HCC) 05/09/2024   AKI (acute kidney injury) (HCC) 05/09/2024   Leukocytosis 05/09/2024   Heat exhaustion, water deprivation 05/09/2024   Major depression, recurrent (HCC) 04/03/2024   Reactive depression 03/22/2024   Alcohol withdrawal syndrome with complication (HCC) 03/22/2024   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   Dehydration 03/21/2024   Acute lactic acidosis 03/21/2024   Delirium tremens (HCC) 03/18/2024   Nausea & vomiting 03/16/2024   Alcohol use disorder 03/16/2024   Tremor 03/16/2024   Neuropathy 03/16/2024   Mood disorder (HCC) 03/16/2024   Memory impairment 02/08/2024    Right knee pain 02/08/2024   Major depressive disorder, recurrent severe without psychotic features (HCC) 01/25/2024   Alcohol withdrawal seizure (HCC) 01/23/2024   Alcohol use disorder, severe, dependence (HCC) 01/18/2024   Healthcare maintenance 06/10/2022   Nightmares 12/21/2021   Subclinical hypothyroidism 12/21/2021   Substance induced mood disorder (HCC) 12/14/2021   Alcohol use disorder, moderate, dependence (HCC) 06/23/2021   Seizures (HCC) 06/22/2021   Elevated liver enzymes 04/19/2016   Essential hypertension 04/19/2016

## 2024-05-31 NOTE — Group Note (Signed)
 Group Topic: Communication  Group Date: 05/31/2024 Start Time: 0800 End Time: 0830 Facilitators: Judi Monico RAMAN, NT  Department: [facility]  Number of Participants: 5  Group Focus: community group Treatment Modality:  Psychoeducation Interventions utilized were other Community Group Purpose: Check In  Name: Xavier Owens Date of Birth: 1971-07-26  MR: [id number]    Level of Participation: active Quality of Participation: attentive Interactions with others: gave feedback Mood/Affect: appropriate Triggers (if applicable): n/a Cognition: coherent/clear Progress: Gaining insight Response: Pt was told [facility] guidelines and expressed understanding. Pt did not have any needs this morning.  Plan: patient will be encouraged to attend future groups.  Patients Problems:  Patient Active Problem List   Diagnosis Date Noted   History of seizure due to alcohol withdrawal 05/30/2024   Gastroesophageal reflux disease without esophagitis 05/30/2024   Hyperlipidemia 05/30/2024   Schatzki's ring 05/23/2024   Encounter to discuss test results 05/22/2024   Non-cardiac chest pain 05/22/2024   Esophageal dysphagia 05/22/2024   Abnormal loss of weight 05/22/2024   Abnormal finding on GI tract imaging 05/22/2024   Chest pain 05/21/2024   Elevated troponin 05/21/2024   Hypotension 05/21/2024   Chest discomfort 05/20/2024   Electrolyte abnormality 05/20/2024   Alcohol withdrawal (HCC) 05/09/2024   AKI (acute kidney injury) (HCC) 05/09/2024   Leukocytosis 05/09/2024   Heat exhaustion, water deprivation 05/09/2024   Major depression, recurrent (HCC) 04/03/2024   Reactive depression 03/22/2024   Alcohol withdrawal syndrome with complication (HCC) 03/22/2024   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   Dehydration 03/21/2024   Acute lactic acidosis 03/21/2024   Delirium tremens (HCC) 03/18/2024   Nausea & vomiting 03/16/2024   Alcohol use disorder 03/16/2024   Tremor  03/16/2024   Neuropathy 03/16/2024   Mood disorder (HCC) 03/16/2024   Memory impairment 02/08/2024   Right knee pain 02/08/2024   Major depressive disorder, recurrent severe without psychotic features (HCC) 01/25/2024   Alcohol withdrawal seizure (HCC) 01/23/2024   Alcohol use disorder, severe, dependence (HCC) 01/18/2024   Healthcare maintenance 06/10/2022   Nightmares 12/21/2021   Subclinical hypothyroidism 12/21/2021   Substance induced mood disorder (HCC) 12/14/2021   Alcohol use disorder, moderate, dependence (HCC) 06/23/2021   Seizures (HCC) 06/22/2021   Elevated liver enzymes 04/19/2016   Essential hypertension 04/19/2016

## 2024-05-31 NOTE — [facility] Group Notes (Signed)
 SPIRITUALITY GROUP NOTE  Spirituality group facilitated by Chaplain Cody Albus, MDiv, [facility].  Group Description: Group focused on topic of hope. Patients participated in facilitated discussion around topic, connecting with one another around experiences and definitions for hope. Group members engaged with visual explorer photos, reflecting on what hope looks like for them today. Group engaged in discussion around how their definitions of hope are present today in hospital.  Modalities: Psycho-social ed, Adlerian, Narrative, MI  Patient Progress: Present throughout group.  Minimal engagement when prompted by facilitator

## 2024-05-31 NOTE — ED Notes (Signed)
 Patient observed/assessed at bedside lying in bed asleep. Patient alert and oriented to self and location. Patient denies pain and anxiety. He denies A/V/H. He denies having any thoughts/plan of self harm and harm towards others. Fluid and snack offered. Verbalizes no further complaints at this time.

## 2024-05-31 NOTE — ED Notes (Signed)
 Patient alert & oriented x4. Denies intent to harm self or others when asked. Denies A/VH. Patient denies any physical complaints when asked. No acute distress noted. Support and encouragement provided. Patient observed in milieu. No inappropriate behaviors seen or reported. Scheduled medications administered with no complications. Patient has a seizure history, Clinical research associate asked patient if he normally has any feeling prior to a seizure coming on, patient states he is able to know when a seizure is oncoming. Writer encouraged patient to alert staff if this feeling arises, patient voiced understanding and agreement. Routine safety checks conducted per facility protocol. Encouraged patient to notify staff if any thoughts of harm towards self or others arise. Patient verbalizes understanding and agreement.

## 2024-05-31 NOTE — ED Notes (Signed)
Pt attended group 

## 2024-06-01 DIAGNOSIS — F331 Major depressive disorder, recurrent, moderate: Secondary | ICD-10-CM | POA: Diagnosis not present

## 2024-06-01 DIAGNOSIS — F1010 Alcohol abuse, uncomplicated: Secondary | ICD-10-CM | POA: Diagnosis not present

## 2024-06-01 DIAGNOSIS — Z8639 Personal history of other endocrine, nutritional and metabolic disease: Secondary | ICD-10-CM | POA: Diagnosis not present

## 2024-06-01 DIAGNOSIS — K219 Gastro-esophageal reflux disease without esophagitis: Secondary | ICD-10-CM | POA: Diagnosis not present

## 2024-06-01 DIAGNOSIS — Z09 Encounter for follow-up examination after completed treatment for conditions other than malignant neoplasm: Secondary | ICD-10-CM | POA: Diagnosis not present

## 2024-06-01 DIAGNOSIS — E785 Hyperlipidemia, unspecified: Secondary | ICD-10-CM | POA: Diagnosis not present

## 2024-06-01 LAB — GLUCOSE, CAPILLARY
Glucose-Capillary: 99 mg/dL (ref 70–99)
Glucose-Capillary: 94 mg/dL (ref 70–99)
Glucose-Capillary: 105 mg/dL — ABNORMAL HIGH (ref 70–99)

## 2024-06-01 MED ORDER — MAGNESIUM OXIDE -MG SUPPLEMENT 400 (240 MG) MG PO TABS
400.0000 mg | ORAL_TABLET | Freq: Two times a day (BID) | ORAL | Status: DC
  Administered 2024-06-01 – 2024-06-03 (×4): 400 mg via ORAL
  Filled 2024-06-01 (×5): qty 1

## 2024-06-01 MED ORDER — MAGNESIUM CITRATE PO SOLN
1.0000 | Freq: Once | ORAL | Status: DC
  Filled 2024-06-01: qty 296

## 2024-06-01 NOTE — Group Note (Signed)
 Group Topic: Communication  Group Date: 06/01/2024 Start Time: 0800 End Time: 0830 Facilitators: Nena Lesley PARAS, NT  Department: [facility]  Number of Participants: 4  Group Focus: check in, community group, daily focus, feeling awareness/expression, and goals/reality orientation Treatment Modality:  Psychoeducation Interventions utilized were clarification, exploration, and patient education Purpose: increase insight  Name: Xavier Owens Date of Birth: 1971-07-10  MR: [id number]    Level of Participation: active Quality of Participation: engaged Interactions with others: gave feedback Mood/Affect: appropriate Triggers (if applicable): N/A Cognition: coherent/clear Progress: Gaining insight Response: PT voiced understanding to the [facility] guidelines. PT did not have any concerns at this time. Plan: patient will be encouraged to continue coming to groups.  Patients Problems:  Patient Active Problem List   Diagnosis Date Noted   History of seizure due to alcohol withdrawal 05/30/2024   Gastroesophageal reflux disease without esophagitis 05/30/2024   Hyperlipidemia 05/30/2024   Schatzki's ring 05/23/2024   Encounter to discuss test results 05/22/2024   Non-cardiac chest pain 05/22/2024   Esophageal dysphagia 05/22/2024   Abnormal loss of weight 05/22/2024   Abnormal finding on GI tract imaging 05/22/2024   Chest pain 05/21/2024   Elevated troponin 05/21/2024   Hypotension 05/21/2024   Chest discomfort 05/20/2024   Electrolyte abnormality 05/20/2024   Alcohol withdrawal (HCC) 05/09/2024   AKI (acute kidney injury) (HCC) 05/09/2024   Leukocytosis 05/09/2024   Heat exhaustion, water deprivation 05/09/2024   Major depression, recurrent (HCC) 04/03/2024   Reactive depression 03/22/2024   Alcohol withdrawal syndrome with complication (HCC) 03/22/2024   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   Dehydration 03/21/2024   Acute lactic acidosis 03/21/2024    Delirium tremens (HCC) 03/18/2024   Nausea & vomiting 03/16/2024   Alcohol use disorder 03/16/2024   Tremor 03/16/2024   Neuropathy 03/16/2024   Mood disorder (HCC) 03/16/2024   Memory impairment 02/08/2024   Right knee pain 02/08/2024   Major depressive disorder, recurrent severe without psychotic features (HCC) 01/25/2024   Alcohol withdrawal seizure (HCC) 01/23/2024   Alcohol use disorder, severe, dependence (HCC) 01/18/2024   Healthcare maintenance 06/10/2022   Nightmares 12/21/2021   Subclinical hypothyroidism 12/21/2021   Substance induced mood disorder (HCC) 12/14/2021   Alcohol use disorder, moderate, dependence (HCC) 06/23/2021   Seizures (HCC) 06/22/2021   Elevated liver enzymes 04/19/2016   Essential hypertension 04/19/2016

## 2024-06-01 NOTE — ED Notes (Signed)
 Pt denies si hi avh- verbal contract for safety provided. Blood glucose check prior to breakfast- 99 mmHg. Pt denies discomfort and pain at this time. Pt reports that anxiety level has somewhat improved since admission to facility.

## 2024-06-01 NOTE — ED Provider Notes (Cosign Needed Addendum)
 Behavioral Health Progress Note  Date and Time: 06/01/2024 3:49 PM Name: Xavier Owens MRN:  [id number]  HPI: Xavier Owens is a 53 y.o. male who [facility] [place] [facility] with worsening depressive symptoms & SI as well as alcohol withdrawals symptoms. Per triage:  Pt presents to [facility] unaccompanied. Pt states he is having withdrawls from alcohol starting this morning. Pt reports that he drank yesterday and all weekend. Pt mentions he had a fifth of alcohol yesterday. Pt mentions he is drinking every weekend.  Patient Assessment: On assessment today, the pt reports that their mood is still very depressed, and rates depression a 10, 10 being worse.  He is also rates anxiety as a 10, 10 being worst.  He reports being isolative, not engaging in unit activities due to his depressive state of mind.  Patient reports racing thoughts, states that Ativan  taper has come to an end, anxiety and depression have worsened.  He reports that he typically drinks alcohol, to cope with depressive symptoms and anxiety at home.  Patient states if I go home today I will drink, but denies that he has cravings for alcohol. He states rather that depression and anxiety are the most bothersome. He states that he is not dealing with physiological withdrawal symptoms from alcohol either, but is facing his reality, without the alcohol being in his system to numb what he is feeling. Patient shares that everything is the problem, when prompted to talk about his stressors; He adds that relationship issues with wife have a lot to do with it, but states that they are not currently residing together, but plan to move to [place] once he is out of the hospital.  He reports feeling sad, depressed, anxious, helpless, reports wishing that symptoms will resolve soon, but he denies suicidal ideations, denies homicidal ideations, denies auditory or visual hallucinations.  Paranoia, there are no overt signs  of psychosis. Patient reports: Sleep is fair. Appetite is fair. Concentration is decreased. Energy level is very low. Denies suicidal thoughts.  Denies suicidal intent and plan.  Denies having any HI.  Denies having psychotic symptoms.   Denies having side effects to current psychiatric medications.   We discussed changes including possibly discontinuing the Cymbalta , Gabapentin  & restarting him on a medication that is more stimulating for him. Due to his mood today, he seems not to have apprehended the rationales, benefits and side effects of medications as presented to him. We will continue current medication regimen, and will revisit medications adjustments tomorrow.  Discussed the following psychosocial stressors:Marital stressors; active listening and empathy are provided.    Patient is requiring additional days at the [facility] after the alcohol taper has been completed to ensure that he is adequately monitored, and continues to be seizure free prior to being discharged. Patient is not interested in pursuing the option f substance abuse rehabilitation at this time, and would like to be discharged home. We talked about discharging him on Tuesday, 06/04/2024 as long as he continues to be free of alcohol related complications, to which he is receptive.  Diagnosis:  Final diagnoses:  Hyperlipidemia, unspecified hyperlipidemia type  Gastroesophageal reflux disease without esophagitis  Alcohol use disorder  Moderate episode of recurrent major depressive disorder (HCC)  History of seizure due to alcohol withdrawal    Total Time spent with patient: 45 minutes  Pain Medications: See MAR Prescriptions: See MAR Over the Counter: See MAR History of alcohol / drug use?: Yes Longest period of sobriety (when/how  long): Unsure. Negative Consequences of Use:  (Unsure.) Withdrawal Symptoms: Seizures, Irritability, Nausea / Vomiting Onset of Seizures: Unknown Date of most recent seizure: sometime  this year, per his report     Sleep: Fair  Appetite:  Fair  Current Medications:  Current Facility-Administered Medications  Medication Dose Route Frequency Provider Last Rate Last Admin   acetaminophen  (TYLENOL ) tablet 650 mg  650 mg Oral Q6H PRN Trudy Carwin, NP       alum & mag hydroxide-simeth (MAALOX/MYLANTA) 200-200-20 MG/5ML suspension 30 mL  30 mL Oral Q4H PRN Trudy Carwin, NP       atorvastatin  (LIPITOR) tablet 40 mg  40 mg Oral QPM Mindi Akerson, NP   40 mg at 05/31/24 1714   DULoxetine  (CYMBALTA ) DR capsule 60 mg  60 mg Oral Daily Soriyah Osberg, NP   60 mg at 06/01/24 0913   folic acid  (FOLVITE ) tablet 1 mg  1 mg Oral Daily Mahealani Sulak, NP   1 mg at 06/01/24 9086   gabapentin  (NEURONTIN ) capsule 600 mg  600 mg Oral TID Tex Drilling, NP   600 mg at 06/01/24 1538   magnesium  citrate solution 1 Bottle  1 Bottle Oral Once Ustin Cruickshank, NP       magnesium  hydroxide (MILK OF MAGNESIA) suspension 30 mL  30 mL Oral Daily PRN Trudy Carwin, NP       magnesium  oxide (MAG-OX) tablet 400 mg  400 mg Oral BID Tex Drilling, NP       melatonin tablet 3 mg  3 mg Oral QHS Cole Kandi BROCKS, MD   3 mg at 05/31/24 2103   multivitamin with minerals tablet 1 tablet  1 tablet Oral Daily Tex Drilling, NP   1 tablet at 06/01/24 0912   naltrexone  (DEPADE) tablet 50 mg  50 mg Oral Daily Maryfer Tauzin, NP   50 mg at 06/01/24 9085   OLANZapine  (ZYPREXA ) injection 10 mg  10 mg Intramuscular TID PRN Trudy Carwin, NP       OLANZapine  (ZYPREXA ) injection 5 mg  5 mg Intramuscular TID PRN Trudy Carwin, NP       OLANZapine  zydis (ZYPREXA ) disintegrating tablet 5 mg  5 mg Oral TID PRN Trudy Carwin, NP       pantoprazole  (PROTONIX ) EC tablet 40 mg  40 mg Oral Daily Mac Dowdell, NP   40 mg at 06/01/24 0914   QUEtiapine  (SEROQUEL ) tablet 100 mg  100 mg Oral QHS Twinkle Sockwell, NP   100 mg at 05/31/24 2102   thiamine  (VITAMIN B1) tablet 100 mg  100 mg Oral Daily Bethea, Terrence C, MD   100 mg  at 06/01/24 0913   traZODone  (DESYREL ) tablet 100 mg  100 mg Oral QHS Tex Drilling, NP   100 mg at 05/31/24 2103   Current Outpatient Medications  Medication Sig Dispense Refill   atorvastatin  (LIPITOR) 40 MG tablet Take 1 tablet (40 mg total) by mouth daily. 30 tablet 0   Cholecalciferol  (VITAMIN D -3 PO) Take 1 capsule by mouth daily.     DULoxetine  (CYMBALTA ) 60 MG capsule Take 1 capsule (60 mg total) by mouth daily. 30 capsule 3   ferrous sulfate  325 (65 FE) MG tablet Take 1 tablet (325 mg total) by mouth at bedtime. 30 tablet 0   folic acid  (FOLVITE ) 1 MG tablet Take 1 tablet (1 mg total) by mouth daily. 30 tablet 0   gabapentin  (NEURONTIN ) 300 MG capsule Take 600 mg by mouth 3 (three) times daily.  hydrOXYzine  (ATARAX ) 25 MG tablet Take 1 tablet (25 mg total) by mouth every 6 (six) hours as needed for anxiety (or CIWA score </= 10). 90 tablet 3   Multiple Vitamin (MULTIVITAMIN WITH MINERALS) TABS tablet Take 1 tablet by mouth daily.     naltrexone  (DEPADE) 50 MG tablet Take 1 tablet (50 mg total) by mouth daily. 30 tablet 3   pantoprazole  (PROTONIX ) 40 MG tablet Take 1 tablet (40 mg total) by mouth daily. 30 tablet 2   propranolol  (INDERAL ) 10 MG tablet Take 10 mg by mouth 2 (two) times daily.     QUEtiapine  (SEROQUEL ) 50 MG tablet Take 1 tablet (50 mg total) by mouth at bedtime. 30 tablet 0   thiamine  (VITAMIN B1) 100 MG tablet Take 100 mg by mouth daily.     traZODone  (DESYREL ) 50 MG tablet Take 1 tablet (50 mg total) by mouth at bedtime. 30 tablet 0    Labs  Lab Results:  Admission on 05/27/2024  Component Date Value Ref Range Status   SARS Coronavirus 2 by RT PCR 05/30/2024 NEGATIVE  NEGATIVE Final   Performed at [facility] Lab, [address] [place], [place] [address]   WBC 05/27/2024 8.2  4.0 - 10.5 K/uL Final   RBC 05/27/2024 4.86  4.22 - 5.81 MIL/uL Final   Hemoglobin 05/27/2024 13.9  13.0 - 17.0 g/dL Final   HCT 92/85/7974 41.7  39.0 - 52.0 % Final   MCV  05/27/2024 85.8  80.0 - 100.0 fL Final   MCH 05/27/2024 28.6  26.0 - 34.0 pg Final   MCHC 05/27/2024 33.3  30.0 - 36.0 g/dL Final   RDW 92/85/7974 14.3  11.5 - 15.5 % Final   Platelets 05/27/2024 346  150 - 400 K/uL Final   nRBC 05/27/2024 0.0  0.0 - 0.2 % Final   Neutrophils Relative % 05/27/2024 64  % Final   Neutro Abs 05/27/2024 5.3  1.7 - 7.7 K/uL Final   Lymphocytes Relative 05/27/2024 18  % Final   Lymphs Abs 05/27/2024 1.5  0.7 - 4.0 K/uL Final   Monocytes Relative 05/27/2024 16  % Final   Monocytes Absolute 05/27/2024 1.3 (H)  0.1 - 1.0 K/uL Final   Eosinophils Relative 05/27/2024 1  % Final   Eosinophils Absolute 05/27/2024 0.1  0.0 - 0.5 K/uL Final   Basophils Relative 05/27/2024 1  % Final   Basophils Absolute 05/27/2024 0.1  0.0 - 0.1 K/uL Final   Immature Granulocytes 05/27/2024 0  % Final   Abs Immature Granulocytes 05/27/2024 0.03  0.00 - 0.07 K/uL Final   Performed at [facility] Lab, [address] [place], [place] [address]   Sodium 05/27/2024 132 (L)  135 - 145 mmol/L Final   Potassium 05/27/2024 3.2 (L)  3.5 - 5.1 mmol/L Final   Chloride 05/27/2024 94 (L)  98 - 111 mmol/L Final   CO2 05/27/2024 21 (L)  22 - 32 mmol/L Final   Glucose, Bld 05/27/2024 97  70 - 99 mg/dL Final   Glucose reference range applies only to samples taken after fasting for at least 8 hours.   BUN 05/27/2024 7  6 - 20 mg/dL Final   Creatinine, Ser 05/27/2024 0.93  0.61 - 1.24 mg/dL Final   Calcium  05/27/2024 10.0  8.9 - 10.3 mg/dL Final   Total Protein 92/85/7974 7.9  6.5 - 8.1 g/dL Final   Albumin 92/85/7974 4.2  3.5 - 5.0 g/dL Final   AST 92/85/7974 47 (H)  15 - 41 U/L Final   ALT 05/27/2024 24  0 - 44 U/L Final   Alkaline Phosphatase 05/27/2024 120  38 - 126 U/L Final   Total Bilirubin 05/27/2024 0.9  0.0 - 1.2 mg/dL Final   GFR, Estimated 05/27/2024 >60  >60 mL/min Final   Comment: (NOTE) Calculated using the CKD-EPI Creatinine Equation (2021)    Anion gap 05/27/2024 17 (H)  5 -  15 Final   Performed at [facility] Lab, [address], [place], [place] [address]   Alcohol, Ethyl (B) 05/27/2024 <15  <15 mg/dL Final   Comment: (NOTE) For medical purposes only. Performed at [facility] Lab, [address] [place], [place] [address]    TSH 05/27/2024 3.436  0.350 - 4.500 uIU/mL Final   Comment: Performed by a 3rd Generation assay with a functional sensitivity of <=0.01 uIU/mL. Performed at [facility] Lab, [address], [place], [place] [address]    POC Amphetamine UR 05/30/2024 None Detected  NONE DETECTED (Cut Off Level 1000 ng/mL) Final   POC Secobarbital (BAR) 05/30/2024 None Detected  NONE DETECTED (Cut Off Level 300 ng/mL) Final   POC Buprenorphine (BUP) 05/30/2024 None Detected  NONE DETECTED (Cut Off Level 10 ng/mL) Final   POC Oxazepam (BZO) 05/30/2024 Positive (A)  NONE DETECTED (Cut Off Level 300 ng/mL) Final   POC Cocaine UR 05/30/2024 None Detected  NONE DETECTED (Cut Off Level 300 ng/mL) Final   POC Methamphetamine UR 05/30/2024 None Detected  NONE DETECTED (Cut Off Level 1000 ng/mL) Final   POC Morphine  05/30/2024 None Detected  NONE DETECTED (Cut Off Level 300 ng/mL) Final   POC Methadone UR 05/30/2024 None Detected  NONE DETECTED (Cut Off Level 300 ng/mL) Final   POC Oxycodone  UR 05/30/2024 None Detected  NONE DETECTED (Cut Off Level 100 ng/mL) Final   POC Marijuana UR 05/30/2024 Positive (A)  NONE DETECTED (Cut Off Level 50 ng/mL) Final   Color, Urine 05/30/2024 YELLOW  YELLOW Final   APPearance 05/30/2024 CLEAR  CLEAR Final   Specific Gravity, Urine 05/30/2024 1.004 (L)  1.005 - 1.030 Final   pH 05/30/2024 7.0  5.0 - 8.0 Final   Glucose, UA 05/30/2024 NEGATIVE  NEGATIVE mg/dL Final   Hgb urine dipstick 05/30/2024 SMALL (A)  NEGATIVE Final   Bilirubin Urine 05/30/2024 NEGATIVE  NEGATIVE Final   Ketones, ur 05/30/2024 NEGATIVE  NEGATIVE mg/dL Final   Protein, ur 92/82/7974 NEGATIVE  NEGATIVE mg/dL Final   Nitrite 92/82/7974 NEGATIVE   NEGATIVE Final   Leukocytes,Ua 05/30/2024 NEGATIVE  NEGATIVE Final   RBC / HPF 05/30/2024 0-5  0 - 5 RBC/hpf Final   WBC, UA 05/30/2024 0-5  0 - 5 WBC/hpf Final   Bacteria, UA 05/30/2024 NONE SEEN  NONE SEEN Final   Squamous Epithelial / HPF 05/30/2024 0-5  0 - 5 /HPF Final   Performed at [facility] Lab, [address], [place], [place] [address]   Sodium 05/30/2024 134 (L)  135 - 145 mmol/L Final   Potassium 05/30/2024 3.6  3.5 - 5.1 mmol/L Final   Chloride 05/30/2024 95 (L)  98 - 111 mmol/L Final   CO2 05/30/2024 27  22 - 32 mmol/L Final   Glucose, Bld 05/30/2024 48 (L)  70 - 99 mg/dL Final   Glucose reference range applies only to samples taken after fasting for at least 8 hours.   BUN 05/30/2024 8  6 - 20 mg/dL Final   Creatinine, Ser 05/30/2024 0.82  0.61 -  1.24 mg/dL Final   Calcium  05/30/2024 9.6  8.9 - 10.3 mg/dL Final   GFR, Estimated 05/30/2024 >60  >60 mL/min Final   Comment: (NOTE) Calculated using the CKD-EPI Creatinine Equation (2021)    Anion gap 05/30/2024 12  5 - 15 Final   Performed at [facility] Lab, [address] [place], [place] [address]   Vit D, 25-Hydroxy 05/30/2024 59.65  30 - 100 ng/mL Final   Comment: (NOTE) Vitamin D  deficiency has been defined by the Institute of Medicine  and an Endocrine Society practice guideline as a level of serum 25-OH  vitamin D  less than 20 ng/mL (1,2). The Endocrine Society went on to  further define vitamin D  insufficiency as a level between 21 and 29  ng/mL (2).  1. IOM (Institute of Medicine). 2010. Dietary reference intakes for  calcium  and D. [place]: [employer]. 2. Holick MF, Binkley [place], Bischoff-Ferrari HA, et al. Evaluation,  treatment, and prevention of vitamin D  deficiency: an Endocrine  Society clinical practice guideline, JCEM. 2011 Jul; 96(7): 1911-30.  Performed at [facility] Lab, [address], [place], [place] [address]    Vitamin B-12 05/30/2024 393  180 - 914 pg/mL  Final   Comment: (NOTE) This assay is not validated for testing neonatal or myeloproliferative syndrome specimens for Vitamin B12 levels. Performed at [facility] Lab, [address], [place], [place] [address]    Glucose-Capillary 05/31/2024 100 (H)  70 - 99 mg/dL Final   Glucose reference range applies only to samples taken after fasting for at least 8 hours.   Glucose-Capillary 06/01/2024 99  70 - 99 mg/dL Final   Glucose reference range applies only to samples taken after fasting for at least 8 hours.   Glucose-Capillary 06/01/2024 94  70 - 99 mg/dL Final   Glucose reference range applies only to samples taken after fasting for at least 8 hours.  Admission on 05/20/2024, Discharged on 05/24/2024  Component Date Value Ref Range Status   WBC 05/20/2024 15.9 (H)  4.0 - 10.5 K/uL Final   RBC 05/20/2024 4.91  4.22 - 5.81 MIL/uL Final   Hemoglobin 05/20/2024 13.8  13.0 - 17.0 g/dL Final   HCT 92/92/7974 42.8  39.0 - 52.0 % Final   MCV 05/20/2024 87.2  80.0 - 100.0 fL Final   MCH 05/20/2024 28.1  26.0 - 34.0 pg Final   MCHC 05/20/2024 32.2  30.0 - 36.0 g/dL Final   RDW 92/92/7974 14.2  11.5 - 15.5 % Final   Platelets 05/20/2024 353  150 - 400 K/uL Final   nRBC 05/20/2024 0.0  0.0 - 0.2 % Final   Performed at [facility] Lab, [address] [place], [place] [address]   Sodium 05/20/2024 135  135 - 145 mmol/L Final   Potassium 05/20/2024 3.3 (L)  3.5 - 5.1 mmol/L Final   Chloride 05/20/2024 99  98 - 111 mmol/L Final   CO2 05/20/2024 11 (L)  22 - 32 mmol/L Final   Glucose, Bld 05/20/2024 87  70 - 99 mg/dL Final   Glucose reference range applies only to samples taken after fasting for at least 8 hours.   BUN 05/20/2024 5 (L)  6 - 20 mg/dL Final   Creatinine, Ser 05/20/2024 1.16  0.61 - 1.24 mg/dL Final   Calcium  05/20/2024 9.5  8.9 - 10.3 mg/dL Final   Total Protein 92/92/7974 7.9  6.5 - 8.1 g/dL Final   Albumin 92/92/7974 3.8  3.5 -  5.0 g/dL Final   AST 92/92/7974 52 (H)  15 -  41 U/L Final   ALT 05/20/2024 21  0 - 44 U/L Final   Alkaline Phosphatase 05/20/2024 97  38 - 126 U/L Final   Total Bilirubin 05/20/2024 2.3 (H)  0.0 - 1.2 mg/dL Final   GFR, Estimated 05/20/2024 >60  >60 mL/min Final   Comment: (NOTE) Calculated using the CKD-EPI Creatinine Equation (2021)    Anion gap 05/20/2024 25 (H)  5 - 15 Corrected   Comment: REPEATED TO VERIFY Performed at [facility] Lab, [address], [place], [place] [address] CORRECTED ON 07/07 AT 1105: PREVIOUSLY REPORTED AS 25 CRITICAL RESULT CALLED TO, READ BACK BY AND VERIFIED WITH DEVOLT,TRAVIS RN 1047 05/20/24 AMIREHSNAIF REPEATED TO VERIFY    Lipase 05/20/2024 32  11 - 51 U/L Final   Performed at [facility] Lab, [address] [place], [place] [address]   Troponin I (High Sensitivity) 05/20/2024 131 (HH)  <18 ng/L Final   Comment: CRITICAL RESULT CALLED TO, READ BACK BY AND VERIFIED WITH DEVOLT,TRAVIS RN 1047 05/20/24 AMIREHSNAIF (NOTE) Elevated high sensitivity troponin I (hsTnI) values and significant  changes across serial measurements may suggest ACS but many other  chronic and acute conditions are known to elevate hsTnI results.  Refer to the Links section for chest pain algorithms and additional  guidance. Performed at [facility] Lab, [address] [place], [place] [address]    Troponin I (High Sensitivity) 05/20/2024 76 (H)  <18 ng/L Final   Comment: (NOTE) Elevated high sensitivity troponin I (hsTnI) values and significant  changes across serial measurements may suggest ACS but many other  chronic and acute conditions are known to elevate hsTnI results.  Refer to the Links section for chest pain algorithms and additional  guidance. Performed at [facility] Lab, [address] [place], [place] [address]    pH, Ven 05/20/2024 7.443 (H)  7.25 - 7.43 Final   pCO2, Ven 05/20/2024 22.4 (L)  44 - 60 mmHg Final   pO2, Ven 05/20/2024 71 (H)  32 - 45 mmHg Final   Bicarbonate 05/20/2024 15.3  (L)  20.0 - 28.0 mmol/L Final   TCO2 05/20/2024 16 (L)  22 - 32 mmol/L Final   O2 Saturation 05/20/2024 95  % Final   Acid-base deficit 05/20/2024 6.0 (H)  0.0 - 2.0 mmol/L Final   Sodium 05/20/2024 128 (L)  135 - 145 mmol/L Final   Potassium 05/20/2024 5.8 (H)  3.5 - 5.1 mmol/L Final   Calcium , Ion 05/20/2024 0.94 (L)  1.15 - 1.40 mmol/L Final   HCT 05/20/2024 48.0  39.0 - 52.0 % Final   Hemoglobin 05/20/2024 16.3  13.0 - 17.0 g/dL Final   Sample type 92/92/7974 VENOUS   Final   Color, Urine 05/20/2024 AMBER (A)  YELLOW Final   BIOCHEMICALS MAY BE AFFECTED BY COLOR   APPearance 05/20/2024 HAZY (A)  CLEAR Final   Specific Gravity, Urine 05/20/2024 1.026  1.005 - 1.030 Final   pH 05/20/2024 5.0  5.0 - 8.0 Final   Glucose, UA 05/20/2024 NEGATIVE  NEGATIVE mg/dL Final   Hgb urine dipstick 05/20/2024 SMALL (A)  NEGATIVE Final   Bilirubin Urine 05/20/2024 NEGATIVE  NEGATIVE Final   Ketones, ur 05/20/2024 80 (A)  NEGATIVE mg/dL Final   Protein, ur 92/92/7974 100 (A)  NEGATIVE mg/dL Final   Nitrite 92/92/7974 NEGATIVE  NEGATIVE Final   Leukocytes,Ua 05/20/2024 NEGATIVE  NEGATIVE Final   RBC /  HPF 05/20/2024 0-5  0 - 5 RBC/hpf Final   WBC, UA 05/20/2024 0-5  0 - 5 WBC/hpf Final   Bacteria, UA 05/20/2024 RARE (A)  NONE SEEN Final   Squamous Epithelial / HPF 05/20/2024 0-5  0 - 5 /HPF Final   Mucus 05/20/2024 PRESENT   Final   Hyaline Casts, UA 05/20/2024 PRESENT   Final   Performed at [facility] Lab, [address] [place], [place] [address]   Osmolality 05/20/2024 279  275 - 295 mOsm/kg Final   Comment: REPEATED TO VERIFY Performed at [facility] Lab, [address], [place], [place] [address]    Weight 05/20/2024 2,528  oz Final   Height 05/20/2024 68  in Final   BP 05/20/2024 122/71  mmHg Final   S' Lateral 05/20/2024 2.50  cm Final   Area-P 1/2 05/20/2024 4.96  cm2 Final   Est EF 05/20/2024 60 - 65%   Final   Cholesterol 05/20/2024 208 (H)  0 - 200 mg/dL Final    Triglycerides 05/20/2024 168 (H)  <150 mg/dL Final   HDL 92/92/7974 79  >40 mg/dL Final   Total CHOL/HDL Ratio 05/20/2024 2.6  RATIO Final   VLDL 05/20/2024 34  0 - 40 mg/dL Final   LDL Cholesterol 05/20/2024 95  0 - 99 mg/dL Final   Comment:        Total Cholesterol/HDL:CHD Risk Coronary Heart Disease Risk Table                     Men   Women  1/2 Average Risk   3.4   3.3  Average Risk       5.0   4.4  2 X Average Risk   9.6   7.1  3 X Average Risk  23.4   11.0        Use the calculated Patient Ratio above and the CHD Risk Table to determine the patient's CHD Risk.        ATP III CLASSIFICATION (LDL):  <100     mg/dL   Optimal  899-870  mg/dL   Near or Above                    Optimal  130-159  mg/dL   Borderline  839-810  mg/dL   High  >809     mg/dL   Very High Performed at [facility] Lab, [address], [place], [place] [address]    Hgb A1c MFr Bld 05/20/2024 4.4 (L)  4.8 - 5.6 % Final   Comment: (NOTE) Diagnosis of Diabetes The following HbA1c ranges recommended by the American Diabetes Association (ADA) may be used as an aid in the diagnosis of diabetes mellitus.  Hemoglobin             Suggested A1C NGSP%              Diagnosis  <5.7                   Non Diabetic  5.7-6.4                Pre-Diabetic  >6.4                   Diabetic  <7.0                   Glycemic control for  adults with diabetes.     Mean Plasma Glucose 05/20/2024 79.58  mg/dL Final   Performed at [facility] Lab, [address] [place], [place] [address]   CRP 05/20/2024 3.3 (H)  <1.0 mg/dL Final   Performed at [facility], [address] [place], [place] [address]   Sed Rate 05/20/2024 4  0 - 16 mm/hr Final   Performed at [facility] Lab, [address], [place], [place] [address]   Alcohol, Ethyl (B) 05/20/2024 <15  <15 mg/dL Final   Comment: (NOTE) For medical purposes only. Performed at [facility] Lab, [address]  [place], [place] [address]    Opiates 05/21/2024 NONE DETECTED  NONE DETECTED Final   Cocaine 05/21/2024 NONE DETECTED  NONE DETECTED Final   Benzodiazepines 05/21/2024 NONE DETECTED  NONE DETECTED Final   Amphetamines 05/21/2024 NONE DETECTED  NONE DETECTED Final   Tetrahydrocannabinol 05/21/2024 NONE DETECTED  NONE DETECTED Final   Barbiturates 05/21/2024 NONE DETECTED  NONE DETECTED Final   Comment: (NOTE) DRUG SCREEN FOR MEDICAL PURPOSES ONLY.  IF CONFIRMATION IS NEEDED FOR ANY PURPOSE, NOTIFY LAB WITHIN 5 DAYS.  LOWEST DETECTABLE LIMITS FOR URINE DRUG SCREEN Drug Class                     Cutoff (ng/mL) Amphetamine and metabolites    1000 Barbiturate and metabolites    200 Benzodiazepine                 200 Opiates and metabolites        300 Cocaine and metabolites        300 THC                            50 Performed at [facility] Lab, [address] [place], [place] [address]    Sodium 05/21/2024 134 (L)  135 - 145 mmol/L Final   Potassium 05/21/2024 3.1 (L)  3.5 - 5.1 mmol/L Final   Chloride 05/21/2024 103  98 - 111 mmol/L Final   CO2 05/21/2024 23  22 - 32 mmol/L Final   Glucose, Bld 05/21/2024 116 (H)  70 - 99 mg/dL Final   Glucose reference range applies only to samples taken after fasting for at least 8 hours.   BUN 05/21/2024 7  6 - 20 mg/dL Final   Creatinine, Ser 05/21/2024 0.91  0.61 - 1.24 mg/dL Final   Calcium  05/21/2024 9.0  8.9 - 10.3 mg/dL Final   GFR, Estimated 05/21/2024 >60  >60 mL/min Final   Comment: (NOTE) Calculated using the CKD-EPI Creatinine Equation (2021)    Anion gap 05/21/2024 8  5 - 15 Final   Performed at [facility] Lab, [address], [place], [place] [address]   Lactic Acid, Venous 05/20/2024 2.4 (HH)  0.5 - 1.9 mmol/L Final   Comment: CRITICAL RESULT CALLED TO, READ BACK BY AND VERIFIED WITH FABIENE BOWLER, RN AT 2128 07.07.25 JLASIGAN Performed at [facility] Lab, [name], [place], [place] [address]    WBC 05/21/2024  6.2  4.0 - 10.5 K/uL Final   RBC 05/21/2024 4.54  4.22 - 5.81 MIL/uL Final   Hemoglobin 05/21/2024 12.9 (L)  13.0 - 17.0 g/dL Final   HCT 92/91/7974 37.9 (L)  39.0 - 52.0 % Final   MCV 05/21/2024 83.5  80.0 - 100.0 fL Final   MCH 05/21/2024 28.4  26.0 -  34.0 pg Final   MCHC 05/21/2024 34.0  30.0 - 36.0 g/dL Final   RDW 92/91/7974 14.5  11.5 - 15.5 % Final   Platelets 05/21/2024 270  150 - 400 K/uL Final   nRBC 05/21/2024 0.0  0.0 - 0.2 % Final   Performed at [facility], [address] [place], [place] [address]   Lactic Acid, Venous 05/20/2024 2.4 (HH)  0.5 - 1.9 mmol/L Final   Comment: CRITICAL VALUE NOTED. VALUE IS CONSISTENT WITH PREVIOUSLY REPORTED/CALLED VALUE Performed at [facility], [address] [place], [place] [address]    Lactic Acid, Venous 05/21/2024 1.6  0.5 - 1.9 mmol/L Final   Performed at [facility], [address] [place], [place] [address]   Magnesium  05/21/2024 1.7  1.7 - 2.4 mg/dL Final   Performed at [facility], [address], [place], [place] [address]   WBC 05/22/2024 6.1  4.0 - 10.5 K/uL Final   RBC 05/22/2024 3.83 (L)  4.22 - 5.81 MIL/uL Final   Hemoglobin 05/22/2024 11.0 (L)  13.0 - 17.0 g/dL Final   HCT 92/90/7974 33.0 (L)  39.0 - 52.0 % Final   MCV 05/22/2024 86.2  80.0 - 100.0 fL Final   MCH 05/22/2024 28.7  26.0 - 34.0 pg Final   MCHC 05/22/2024 33.3  30.0 - 36.0 g/dL Final   RDW 92/90/7974 14.4  11.5 - 15.5 % Final   Platelets 05/22/2024 219  150 - 400 K/uL Final   nRBC 05/22/2024 0.0  0.0 - 0.2 % Final   Performed at [facility], [address], [place], [place] [address]   Sodium 05/22/2024 137  135 - 145 mmol/L Final   Potassium 05/22/2024 3.6  3.5 - 5.1 mmol/L Final   Chloride 05/22/2024 107  98 - 111 mmol/L Final   CO2 05/22/2024 24  22 - 32 mmol/L Final   Glucose, Bld 05/22/2024 100 (H)  70 - 99 mg/dL Final   Glucose reference range applies only to samples taken after fasting for at least 8 hours.    BUN 05/22/2024 5 (L)  6 - 20 mg/dL Final   Creatinine, Ser 05/22/2024 0.75  0.61 - 1.24 mg/dL Final   Calcium  05/22/2024 8.1 (L)  8.9 - 10.3 mg/dL Final   GFR, Estimated 05/22/2024 >60  >60 mL/min Final   Comment: (NOTE) Calculated using the CKD-EPI Creatinine Equation (2021)    Anion gap 05/22/2024 6  5 - 15 Final   Performed at [facility], [address] [place], [place] [address]   WBC 05/23/2024 6.3  4.0 - 10.5 K/uL Final   RBC 05/23/2024 4.58  4.22 - 5.81 MIL/uL Final   Hemoglobin 05/23/2024 12.8 (L)  13.0 - 17.0 g/dL Final   HCT 92/89/7974 39.5  39.0 - 52.0 % Final   MCV 05/23/2024 86.2  80.0 - 100.0 fL Final   MCH 05/23/2024 27.9  26.0 - 34.0 pg Final   MCHC 05/23/2024 32.4  30.0 - 36.0 g/dL Final   RDW 92/89/7974 14.6  11.5 - 15.5 % Final   Platelets 05/23/2024 252  150 - 400 K/uL Final   nRBC 05/23/2024 0.0  0.0 - 0.2 % Final   Performed at [facility], [address], [place], [place] [address]   Sodium 05/23/2024 139  135 - 145 mmol/L Final   Potassium 05/23/2024 3.4 (L)  3.5 - 5.1 mmol/L Final   Chloride 05/23/2024 102  98 -  111 mmol/L Final   CO2 05/23/2024 22  22 - 32 mmol/L Final   Glucose, Bld 05/23/2024 92  70 - 99 mg/dL Final   Glucose reference range applies only to samples taken after fasting for at least 8 hours.   BUN 05/23/2024 <5 (L)  6 - 20 mg/dL Final   Creatinine, Ser 05/23/2024 0.64  0.61 - 1.24 mg/dL Final   Calcium  05/23/2024 9.7  8.9 - 10.3 mg/dL Final   GFR, Estimated 05/23/2024 >60  >60 mL/min Final   Comment: (NOTE) Calculated using the CKD-EPI Creatinine Equation (2021)    Anion gap 05/23/2024 15  5 - 15 Final   Performed at [facility], [address] [place], [place] [address]   Magnesium  05/23/2024 1.6 (L)  1.7 - 2.4 mg/dL Final   Performed at [facility], [address] [place], [place] [address]   WBC 05/24/2024 5.1  4.0 - 10.5 K/uL Final   RBC 05/24/2024 4.36  4.22 - 5.81 MIL/uL Final   Hemoglobin  05/24/2024 12.6 (L)  13.0 - 17.0 g/dL Final   HCT 92/88/7974 38.0 (L)  39.0 - 52.0 % Final   MCV 05/24/2024 87.2  80.0 - 100.0 fL Final   MCH 05/24/2024 28.9  26.0 - 34.0 pg Final   MCHC 05/24/2024 33.2  30.0 - 36.0 g/dL Final   RDW 92/88/7974 14.7  11.5 - 15.5 % Final   Platelets 05/24/2024 237  150 - 400 K/uL Final   nRBC 05/24/2024 0.0  0.0 - 0.2 % Final   Performed at [facility], [address], [place], [place] [address]   Sodium 05/24/2024 136  135 - 145 mmol/L Final   Potassium 05/24/2024 3.6  3.5 - 5.1 mmol/L Final   Chloride 05/24/2024 103  98 - 111 mmol/L Final   CO2 05/24/2024 22  22 - 32 mmol/L Final   Glucose, Bld 05/24/2024 100 (H)  70 - 99 mg/dL Final   Glucose reference range applies only to samples taken after fasting for at least 8 hours.   BUN 05/24/2024 6  6 - 20 mg/dL Final   Creatinine, Ser 05/24/2024 0.61  0.61 - 1.24 mg/dL Final   Calcium  05/24/2024 9.4  8.9 - 10.3 mg/dL Final   Phosphorus 92/88/7974 4.9 (H)  2.5 - 4.6 mg/dL Final   Albumin 92/88/7974 3.3 (L)  3.5 - 5.0 g/dL Final   GFR, Estimated 05/24/2024 >60  >60 mL/min Final   Comment: (NOTE) Calculated using the CKD-EPI Creatinine Equation (2021)    Anion gap 05/24/2024 11  5 - 15 Final   Performed at [facility], [address], [place], [place] [address]  Admission on 05/08/2024, Discharged on 05/10/2024  Component Date Value Ref Range Status   Glucose-Capillary 05/08/2024 105 (H)  70 - 99 mg/dL Final   Glucose reference range applies only to samples taken after fasting for at least 8 hours.   Troponin I (High Sensitivity) 05/08/2024 6  <18 ng/L Final   Comment: (NOTE) Elevated high sensitivity troponin I (hsTnI) values and significant  changes across serial measurements may suggest ACS but many other  chronic and acute conditions are known to elevate hsTnI results.  Refer to the Links section for chest pain algorithms and additional  guidance. Performed at [facility],  [address] [place], [place] [address]    WBC 05/08/2024 13.3 (H)  4.0 - 10.5 K/uL Final   RBC 05/08/2024 5.23  4.22 -  5.81 MIL/uL Final   Hemoglobin 05/08/2024 14.8  13.0 - 17.0 g/dL Final   HCT 93/74/7974 46.3  39.0 - 52.0 % Final   MCV 05/08/2024 88.5  80.0 - 100.0 fL Final   MCH 05/08/2024 28.3  26.0 - 34.0 pg Final   MCHC 05/08/2024 32.0  30.0 - 36.0 g/dL Final   RDW 93/74/7974 14.4  11.5 - 15.5 % Final   Platelets 05/08/2024 335  150 - 400 K/uL Final   nRBC 05/08/2024 0.0  0.0 - 0.2 % Final   Performed at [facility], [address], [place], [place] [address]   Sodium 05/08/2024 142  135 - 145 mmol/L Final   Potassium 05/08/2024 4.2  3.5 - 5.1 mmol/L Final   Chloride 05/08/2024 103  98 - 111 mmol/L Final   CO2 05/08/2024 10 (L)  22 - 32 mmol/L Final   Glucose, Bld 05/08/2024 99  70 - 99 mg/dL Final   Glucose reference range applies only to samples taken after fasting for at least 8 hours.   BUN 05/08/2024 8  6 - 20 mg/dL Final   Creatinine, Ser 05/08/2024 1.31 (H)  0.61 - 1.24 mg/dL Final   Calcium  05/08/2024 10.3  8.9 - 10.3 mg/dL Final   Total Protein 93/74/7974 9.4 (H)  6.5 - 8.1 g/dL Final   Albumin 93/74/7974 4.8  3.5 - 5.0 g/dL Final   AST 93/74/7974 35  15 - 41 U/L Final   ALT 05/08/2024 15  0 - 44 U/L Final   Alkaline Phosphatase 05/08/2024 109  38 - 126 U/L Final   Total Bilirubin 05/08/2024 2.4 (H)  0.0 - 1.2 mg/dL Final   GFR, Estimated 05/08/2024 >60  >60 mL/min Final   Comment: (NOTE) Calculated using the CKD-EPI Creatinine Equation (2021)    Anion gap 05/08/2024 29 (H)  5 - 15 Final   Comment: ELECTROLYTES REPEATED TO VERIFY Performed at [facility], [address] [place], [place] [address]    Troponin I (High Sensitivity) 05/08/2024 7  <18 ng/L Final   Comment: (NOTE) Elevated high sensitivity troponin I (hsTnI) values and significant  changes across serial measurements may suggest ACS but many other  chronic and acute conditions are known  to elevate hsTnI results.  Refer to the Links section for chest pain algorithms and additional  guidance. Performed at [facility], [address] [place], [place] [address]    Lipase 05/08/2024 34  11 - 51 U/L Final   Performed at [facility], [address], [place], [place] [address]   Alcohol, Ethyl (B) 05/09/2024 <15  <15 mg/dL Final   Comment: (NOTE) For medical purposes only. Performed at [facility], [address], [place], [place] [address]    Lactic Acid, Venous 05/09/2024 1.2  0.5 - 1.9 mmol/L Final   Performed at [facility], [address] [place], [place] [address]   Color, Urine 05/09/2024 YELLOW  YELLOW Final   APPearance 05/09/2024 CLEAR  CLEAR Final   Specific Gravity, Urine 05/09/2024 1.039 (H)  1.005 - 1.030 Final   pH 05/09/2024 5.0  5.0 - 8.0 Final   Glucose, UA 05/09/2024 NEGATIVE  NEGATIVE mg/dL Final   Hgb urine dipstick 05/09/2024 MODERATE (A)  NEGATIVE Final   Bilirubin Urine 05/09/2024 NEGATIVE  NEGATIVE Final   Ketones, ur 05/09/2024 80 (A)  NEGATIVE mg/dL Final   Protein, ur 93/73/7974 >=300 (A)  NEGATIVE mg/dL Final   Nitrite 93/73/7974  NEGATIVE  NEGATIVE Final   Leukocytes,Ua 05/09/2024 NEGATIVE  NEGATIVE Final   RBC / HPF 05/09/2024 6-10  0 - 5 RBC/hpf Final   WBC, UA 05/09/2024 0-5  0 - 5 WBC/hpf Final   Bacteria, UA 05/09/2024 NONE SEEN  NONE SEEN Final   Squamous Epithelial / HPF 05/09/2024 6-10  0 - 5 /HPF Final   Mucus 05/09/2024 PRESENT   Final   Performed at [facility], [address] [place], [place] [address]   Sodium 05/09/2024 132 (L)  135 - 145 mmol/L Final   DELTA CHECK NOTED   Potassium 05/09/2024 3.6  3.5 - 5.1 mmol/L Final   Chloride 05/09/2024 102  98 - 111 mmol/L Final   CO2 05/09/2024 21 (L)  22 - 32 mmol/L Final   Glucose, Bld 05/09/2024 88  70 - 99 mg/dL Final   Glucose reference range applies only to samples taken after fasting for at least 8 hours.   BUN 05/09/2024 6  6 - 20 mg/dL  Final   Creatinine, Ser 05/09/2024 0.74  0.61 - 1.24 mg/dL Final   Calcium  05/09/2024 9.5  8.9 - 10.3 mg/dL Final   GFR, Estimated 05/09/2024 >60  >60 mL/min Final   Comment: (NOTE) Calculated using the CKD-EPI Creatinine Equation (2021)    Anion gap 05/09/2024 9  5 - 15 Final   Performed at [facility], [address] [place], [place] [address]   Total Protein 05/09/2024 7.2  6.5 - 8.1 g/dL Final   Albumin 93/73/7974 3.8  3.5 - 5.0 g/dL Final   AST 93/73/7974 32  15 - 41 U/L Final   ALT 05/09/2024 15  0 - 44 U/L Final   Alkaline Phosphatase 05/09/2024 92  38 - 126 U/L Final   Total Bilirubin 05/09/2024 1.8 (H)  0.0 - 1.2 mg/dL Final   Bilirubin, Direct 05/09/2024 0.4 (H)  0.0 - 0.2 mg/dL Final   Indirect Bilirubin 05/09/2024 1.4 (H)  0.3 - 0.9 mg/dL Final   Performed at [facility], [address], [place], [place] [address]   pH, Ven 05/09/2024 7.43  7.25 - 7.43 Final   pCO2, Ven 05/09/2024 32 (L)  44 - 60 mmHg Final   pO2, Ven 05/09/2024 50 (H)  32 - 45 mmHg Final   Bicarbonate 05/09/2024 21.2  20.0 - 28.0 mmol/L Final   Acid-base deficit 05/09/2024 2.3 (H)  0.0 - 2.0 mmol/L Final   O2 Saturation 05/09/2024 80.6  % Final   Patient temperature 05/09/2024 37.0   Final   Performed at [facility], [address], [place], [place] [address]   WBC 05/09/2024 7.9  4.0 - 10.5 K/uL Final   RBC 05/09/2024 4.75  4.22 - 5.81 MIL/uL Final   Hemoglobin 05/09/2024 13.5  13.0 - 17.0 g/dL Final   HCT 93/73/7974 40.5  39.0 - 52.0 % Final   MCV 05/09/2024 85.3  80.0 - 100.0 fL Final   MCH 05/09/2024 28.4  26.0 - 34.0 pg Final   MCHC 05/09/2024 33.3  30.0 - 36.0 g/dL Final   RDW 93/73/7974 14.2  11.5 - 15.5 % Final   Platelets 05/09/2024 260  150 - 400 K/uL Final   nRBC 05/09/2024 0.0  0.0 - 0.2 % Final   Performed at [facility], [address] [place], [place] [address]  Admission on 04/03/2024, Discharged on 04/12/2024  Component Date Value Ref Range Status    Sodium 04/04/2024 135  135 -  145 mmol/L Final   Potassium 04/04/2024 3.8  3.5 - 5.1 mmol/L Final   Chloride 04/04/2024 102  98 - 111 mmol/L Final   CO2 04/04/2024 22  22 - 32 mmol/L Final   Glucose, Bld 04/04/2024 109 (H)  70 - 99 mg/dL Final   Glucose reference range applies only to samples taken after fasting for at least 8 hours.   BUN 04/04/2024 6  6 - 20 mg/dL Final   Creatinine, Ser 04/04/2024 0.74  0.61 - 1.24 mg/dL Final   Calcium  04/04/2024 9.1  8.9 - 10.3 mg/dL Final   GFR, Estimated 04/04/2024 >60  >60 mL/min Final   Comment: (NOTE) Calculated using the CKD-EPI Creatinine Equation (2021)    Anion gap 04/04/2024 11  5 - 15 Final   Performed at [facility], [address] [place], [place] [address]   Cholesterol 04/04/2024 169  0 - 200 mg/dL Final   Triglycerides 94/77/7974 195 (H)  <150 mg/dL Final   HDL 94/77/7974 56  >40 mg/dL Final   Total CHOL/HDL Ratio 04/04/2024 3.0  RATIO Final   VLDL 04/04/2024 39  0 - 40 mg/dL Final   LDL Cholesterol 04/04/2024 74  0 - 99 mg/dL Final   Comment:        Total Cholesterol/HDL:CHD Risk Coronary Heart Disease Risk Table                     Men   Women  1/2 Average Risk   3.4   3.3  Average Risk       5.0   4.4  2 X Average Risk   9.6   7.1  3 X Average Risk  23.4   11.0        Use the calculated Patient Ratio above and the CHD Risk Table to determine the patient's CHD Risk.        ATP III CLASSIFICATION (LDL):  <100     mg/dL   Optimal  899-870  mg/dL   Near or Above                    Optimal  130-159  mg/dL   Borderline  839-810  mg/dL   High  >809     mg/dL   Very High Performed at [facility], [address], [place], [place] [address]   Admission on 04/02/2024, Discharged on 04/03/2024  Component Date Value Ref Range Status   Sodium 04/02/2024 137  135 - 145 mmol/L Final   Potassium 04/02/2024 3.3 (L)  3.5 - 5.1 mmol/L Final   Chloride 04/02/2024 102  98 - 111 mmol/L Final    CO2 04/02/2024 23  22 - 32 mmol/L Final   Glucose, Bld 04/02/2024 90  70 - 99 mg/dL Final   Glucose reference range applies only to samples taken after fasting for at least 8 hours.   BUN 04/02/2024 <5 (L)  6 - 20 mg/dL Final   Creatinine, Ser 04/02/2024 0.77  0.61 - 1.24 mg/dL Final   Calcium  04/02/2024 9.6  8.9 - 10.3 mg/dL Final   Total Protein 94/79/7974 8.1  6.5 - 8.1 g/dL Final   Albumin 94/79/7974 3.8  3.5 - 5.0 g/dL Final   AST 94/79/7974 38  15 - 41 U/L Final   ALT 04/02/2024 23  0 - 44 U/L Final   Alkaline Phosphatase 04/02/2024 116  38 - 126 U/L Final   Total Bilirubin 04/02/2024 0.8  0.0 -  1.2 mg/dL Final   GFR, Estimated 04/02/2024 >60  >60 mL/min Final   Comment: (NOTE) Calculated using the CKD-EPI Creatinine Equation (2021)    Anion gap 04/02/2024 12  5 - 15 Final   Performed at [facility], [address] [place], [place] [address]   Alcohol, Ethyl (B) 04/02/2024 257 (H)  <15 mg/dL Final   Comment: Please note change in reference range. (NOTE) For medical purposes only. Performed at [facility], [address] [place], [place] [address]    Opiates 04/02/2024 NONE DETECTED  NONE DETECTED Final   Cocaine 04/02/2024 NONE DETECTED  NONE DETECTED Final   Benzodiazepines 04/02/2024 POSITIVE (A)  NONE DETECTED Final   Amphetamines 04/02/2024 NONE DETECTED  NONE DETECTED Final   Tetrahydrocannabinol 04/02/2024 NONE DETECTED  NONE DETECTED Final   Barbiturates 04/02/2024 NONE DETECTED  NONE DETECTED Final   Comment: (NOTE) DRUG SCREEN FOR MEDICAL PURPOSES ONLY.  IF CONFIRMATION IS NEEDED FOR ANY PURPOSE, NOTIFY LAB WITHIN 5 DAYS.  LOWEST DETECTABLE LIMITS FOR URINE DRUG SCREEN Drug Class                     Cutoff (ng/mL) Amphetamine and metabolites    1000 Barbiturate and metabolites    200 Benzodiazepine                 200 Opiates and metabolites        300 Cocaine and metabolites        300 THC                             50 Performed at [facility], [address], [place], [place] [address]    WBC 04/02/2024 6.3  4.0 - 10.5 K/uL Final   RBC 04/02/2024 4.44  4.22 - 5.81 MIL/uL Final   Hemoglobin 04/02/2024 12.7 (L)  13.0 - 17.0 g/dL Final   HCT 94/79/7974 39.1  39.0 - 52.0 % Final   MCV 04/02/2024 88.1  80.0 - 100.0 fL Final   MCH 04/02/2024 28.6  26.0 - 34.0 pg Final   MCHC 04/02/2024 32.5  30.0 - 36.0 g/dL Final   RDW 94/79/7974 14.5  11.5 - 15.5 % Final   Platelets 04/02/2024 448 (H)  150 - 400 K/uL Final   nRBC 04/02/2024 0.0  0.0 - 0.2 % Final   Neutrophils Relative % 04/02/2024 63  % Final   Neutro Abs 04/02/2024 3.9  1.7 - 7.7 K/uL Final   Lymphocytes Relative 04/02/2024 25  % Final   Lymphs Abs 04/02/2024 1.6  0.7 - 4.0 K/uL Final   Monocytes Relative 04/02/2024 10  % Final   Monocytes Absolute 04/02/2024 0.7  0.1 - 1.0 K/uL Final   Eosinophils Relative 04/02/2024 1  % Final   Eosinophils Absolute 04/02/2024 0.1  0.0 - 0.5 K/uL Final   Basophils Relative 04/02/2024 1  % Final   Basophils Absolute 04/02/2024 0.1  0.0 - 0.1 K/uL Final   Immature Granulocytes 04/02/2024 0  % Final   Abs Immature Granulocytes 04/02/2024 0.02  0.00 - 0.07 K/uL Final   Performed at [facility], [address], [place], [place] [address]  No results displayed because visit has over 200 results.    Admission on 01/25/2024, Discharged on 02/03/2024  Component Date Value Ref Range Status   Folate 01/29/2024 22.8  >5.9 ng/mL Final   Performed at [facility], [address] [place], [place] [address]   Hgb A1c MFr Bld 01/29/2024 4.5 (L)  4.8 - 5.6 % Final   Comment: (NOTE) Pre diabetes:          5.7%-6.4%  Diabetes:              >6.4%  Glycemic control for   <7.0% adults with diabetes    Mean Plasma Glucose 01/29/2024 82.45  mg/dL Final   Performed at [facility], [address] [place], [place] [address]   Cholesterol 01/29/2024 174  0 -  200 mg/dL Final   Triglycerides 96/82/7974 165 (H)  <150 mg/dL Final   HDL 96/82/7974 50  >40 mg/dL Final   Total CHOL/HDL Ratio 01/29/2024 3.5  RATIO Final   VLDL 01/29/2024 33  0 - 40 mg/dL Final   LDL Cholesterol 01/29/2024 91  0 - 99 mg/dL Final   Comment:        Total Cholesterol/HDL:CHD Risk Coronary Heart Disease Risk Table                     Men   Women  1/2 Average Risk   3.4   3.3  Average Risk       5.0   4.4  2 X Average Risk   9.6   7.1  3 X Average Risk  23.4   11.0        Use the calculated Patient Ratio above and the CHD Risk Table to determine the patient's CHD Risk.        ATP III CLASSIFICATION (LDL):  <100     mg/dL   Optimal  899-870  mg/dL   Near or Above                    Optimal  130-159  mg/dL   Borderline  839-810  mg/dL   High  >809     mg/dL   Very High Performed at [facility], [address], [place], [place] [address]    RPR Ser Ql 01/29/2024 NON REACTIVE  NON REACTIVE Final   Performed at [facility], [address] [place], [place] [address]   TSH 01/29/2024 4.117  0.350 - 4.500 uIU/mL Final   Comment: Performed by a 3rd Generation assay with a functional sensitivity of <=0.01 uIU/mL. Performed at [facility], [address], [place], [place] [address]    Vitamin B-12 01/29/2024 585  180 - 914 pg/mL Final   Comment: (NOTE) This assay is not validated for testing neonatal or myeloproliferative syndrome specimens for Vitamin B12 levels. Performed at [facility], [address], [place], [place] [address]    Vit D, 25-Hydroxy 01/29/2024 29.42 (L)  30 - 100 ng/mL Final   Comment: (NOTE) Vitamin D  deficiency has been defined by the Institute of Medicine  and an Endocrine Society practice guideline as a level of serum 25-OH  vitamin D  less than 20 ng/mL (1,2). The Endocrine Society went on to  further define vitamin D  insufficiency as a level between 21 and 29  ng/mL  (2).  1. IOM (Institute of Medicine). 2010. Dietary reference intakes for  calcium  and D. [place]: [employer]. 2. Holick MF, Binkley [place], Bischoff-Ferrari HA, et al. Evaluation,  treatment, and prevention of vitamin D  deficiency: an Endocrine  Society clinical practice guideline, JCEM. 2011 Jul; 96(7): 1911-30.  Performed at [facility],  [address], [place], [place] [address]    Free T4 02/01/2024 0.74  0.61 - 1.12 ng/dL Final   Comment: (NOTE) Biotin ingestion may interfere with free T4 tests. If the results are inconsistent with the TSH level, previous test results, or the clinical presentation, then consider biotin interference. If needed, order repeat testing after stopping biotin. Performed at [facility], [address], [place], [place] [address]   Admission on 01/23/2024, Discharged on 01/25/2024  Component Date Value Ref Range Status   Glucose-Capillary 01/23/2024 111 (H)  70 - 99 mg/dL Final   Glucose reference range applies only to samples taken after fasting for at least 8 hours.   Sodium 01/23/2024 133 (L)  135 - 145 mmol/L Final   Potassium 01/23/2024 4.3  3.5 - 5.1 mmol/L Final   Chloride 01/23/2024 102  98 - 111 mmol/L Final   CO2 01/23/2024 23  22 - 32 mmol/L Final   Glucose, Bld 01/23/2024 100 (H)  70 - 99 mg/dL Final   Glucose reference range applies only to samples taken after fasting for at least 8 hours.   BUN 01/23/2024 8  6 - 20 mg/dL Final   Creatinine, Ser 01/23/2024 0.92  0.61 - 1.24 mg/dL Final   Calcium  01/23/2024 9.4  8.9 - 10.3 mg/dL Final   Total Protein 96/88/7974 7.4  6.5 - 8.1 g/dL Final   Albumin 96/88/7974 3.6  3.5 - 5.0 g/dL Final   AST 96/88/7974 125 (H)  15 - 41 U/L Final   ALT 01/23/2024 57 (H)  0 - 44 U/L Final   Alkaline Phosphatase 01/23/2024 85  38 - 126 U/L Final   Total Bilirubin 01/23/2024 0.7  0.0 - 1.2 mg/dL Final   GFR, Estimated 01/23/2024 >60  >60 mL/min Final   Comment:  (NOTE) Calculated using the CKD-EPI Creatinine Equation (2021)    Anion gap 01/23/2024 8  5 - 15 Final   Performed at [facility], [address] [place], [place] [address]   WBC 01/23/2024 5.2  4.0 - 10.5 K/uL Final   RBC 01/23/2024 3.69 (L)  4.22 - 5.81 MIL/uL Final   Hemoglobin 01/23/2024 11.7 (L)  13.0 - 17.0 g/dL Final   HCT 96/88/7974 35.7 (L)  39.0 - 52.0 % Final   MCV 01/23/2024 96.7  80.0 - 100.0 fL Final   MCH 01/23/2024 31.7  26.0 - 34.0 pg Final   MCHC 01/23/2024 32.8  30.0 - 36.0 g/dL Final   RDW 96/88/7974 13.8  11.5 - 15.5 % Final   Platelets 01/23/2024 291  150 - 400 K/uL Final   nRBC 01/23/2024 0.0  0.0 - 0.2 % Final   Neutrophils Relative % 01/23/2024 65  % Final   Neutro Abs 01/23/2024 3.3  1.7 - 7.7 K/uL Final   Lymphocytes Relative 01/23/2024 16  % Final   Lymphs Abs 01/23/2024 0.8  0.7 - 4.0 K/uL Final   Monocytes Relative 01/23/2024 15  % Final   Monocytes Absolute 01/23/2024 0.8  0.1 - 1.0 K/uL Final   Eosinophils Relative 01/23/2024 3  % Final   Eosinophils Absolute 01/23/2024 0.2  0.0 - 0.5 K/uL Final   Basophils Relative 01/23/2024 1  % Final   Basophils Absolute 01/23/2024 0.1  0.0 - 0.1 K/uL Final   Immature Granulocytes 01/23/2024 0  % Final   Abs Immature Granulocytes 01/23/2024 0.02  0.00 - 0.07 K/uL Final   Performed at [facility], [address], [place],  [place] [address]   Magnesium  01/23/2024 2.1  1.7 - 2.4 mg/dL Final   Performed at [facility], [address] [place], [place] [address]   Alcohol, Ethyl (B) 01/23/2024 <10  <10 mg/dL Final   Comment: (NOTE) Lowest detectable limit for serum alcohol is 10 mg/dL.  For medical purposes only. Performed at [facility], [address], [place], [place] [address]    Opiates 01/23/2024 NONE DETECTED  NONE DETECTED Final   Cocaine 01/23/2024 NONE DETECTED  NONE DETECTED Final   Benzodiazepines 01/23/2024 POSITIVE (A)  NONE DETECTED Final   Amphetamines 01/23/2024 NONE  DETECTED  NONE DETECTED Final   Tetrahydrocannabinol 01/23/2024 NONE DETECTED  NONE DETECTED Final   Barbiturates 01/23/2024 NONE DETECTED  NONE DETECTED Final   Comment: (NOTE) DRUG SCREEN FOR MEDICAL PURPOSES ONLY.  IF CONFIRMATION IS NEEDED FOR ANY PURPOSE, NOTIFY LAB WITHIN 5 DAYS.  LOWEST DETECTABLE LIMITS FOR URINE DRUG SCREEN Drug Class                     Cutoff (ng/mL) Amphetamine and metabolites    1000 Barbiturate and metabolites    200 Benzodiazepine                 200 Opiates and metabolites        300 Cocaine and metabolites        300 THC                            50 Performed at [facility], [address] [place], [place] [address]    Color, Urine 01/23/2024 YELLOW  YELLOW Final   APPearance 01/23/2024 CLEAR  CLEAR Final   Specific Gravity, Urine 01/23/2024 1.011  1.005 - 1.030 Final   pH 01/23/2024 6.0  5.0 - 8.0 Final   Glucose, UA 01/23/2024 NEGATIVE  NEGATIVE mg/dL Final   Hgb urine dipstick 01/23/2024 NEGATIVE  NEGATIVE Final   Bilirubin Urine 01/23/2024 NEGATIVE  NEGATIVE Final   Ketones, ur 01/23/2024 NEGATIVE  NEGATIVE mg/dL Final   Protein, ur 96/88/7974 NEGATIVE  NEGATIVE mg/dL Final   Nitrite 96/88/7974 NEGATIVE  NEGATIVE Final   Leukocytes,Ua 01/23/2024 NEGATIVE  NEGATIVE Final   Performed at [facility], [address] [place], [place] [address]   Glucose-Capillary 01/23/2024 87  70 - 99 mg/dL Final   Glucose reference range applies only to samples taken after fasting for at least 8 hours.   Sodium 01/23/2024 133 (L)  135 - 145 mmol/L Final   Potassium 01/23/2024 4.3  3.5 - 5.1 mmol/L Final   Chloride 01/23/2024 101  98 - 111 mmol/L Final   BUN 01/23/2024 9  6 - 20 mg/dL Final   Creatinine, Ser 01/23/2024 0.90  0.61 - 1.24 mg/dL Final   Glucose, Bld 96/88/7974 93  70 - 99 mg/dL Final   Glucose reference range applies only to samples taken after fasting for at least 8 hours.   Calcium , Ion 01/23/2024 1.05 (L)  1.15 - 1.40 mmol/L  Final   TCO2 01/23/2024 25  22 - 32 mmol/L Final   Hemoglobin 01/23/2024 12.2 (L)  13.0 - 17.0 g/dL Final   HCT 96/88/7974 36.0 (L)  39.0 - 52.0 % Final   HIV Screen 4th Generation wRfx 01/24/2024 Non Reactive  Non Reactive Final   Performed at [facility], [address], [place], [place] [address]   WBC 01/24/2024 4.5  4.0 - 10.5 K/uL Final   RBC 01/24/2024 3.75 (L)  4.22 - 5.81 MIL/uL Final   Hemoglobin 01/24/2024 11.8 (L)  13.0 - 17.0 g/dL Final   HCT 96/87/7974 35.2 (L)  39.0 - 52.0 % Final   MCV 01/24/2024 93.9  80.0 - 100.0 fL Final   MCH 01/24/2024 31.5  26.0 - 34.0 pg Final   MCHC 01/24/2024 33.5  30.0 - 36.0 g/dL Final   RDW 96/87/7974 13.8  11.5 - 15.5 % Final   Platelets 01/24/2024 301  150 - 400 K/uL Final   nRBC 01/24/2024 0.0  0.0 - 0.2 % Final   Performed at [facility], [address] [place], [place] [address]   Sodium 01/24/2024 135  135 - 145 mmol/L Final   Potassium 01/24/2024 3.9  3.5 - 5.1 mmol/L Final   Chloride 01/24/2024 104  98 - 111 mmol/L Final   CO2 01/24/2024 22  22 - 32 mmol/L Final   Glucose, Bld 01/24/2024 96  70 - 99 mg/dL Final   Glucose reference range applies only to samples taken after fasting for at least 8 hours.   BUN 01/24/2024 8  6 - 20 mg/dL Final   Creatinine, Ser 01/24/2024 0.96  0.61 - 1.24 mg/dL Final   Calcium  01/24/2024 9.5  8.9 - 10.3 mg/dL Final   Total Protein 96/87/7974 7.5  6.5 - 8.1 g/dL Final   Albumin 96/87/7974 3.6  3.5 - 5.0 g/dL Final   AST 96/87/7974 86 (H)  15 - 41 U/L Final   ALT 01/24/2024 51 (H)  0 - 44 U/L Final   Alkaline Phosphatase 01/24/2024 81  38 - 126 U/L Final   Total Bilirubin 01/24/2024 0.7  0.0 - 1.2 mg/dL Final   GFR, Estimated 01/24/2024 >60  >60 mL/min Final   Comment: (NOTE) Calculated using the CKD-EPI Creatinine Equation (2021)    Anion gap 01/24/2024 9  5 - 15 Final   Performed at [facility], [address] [place], [place] [address]   WBC 01/25/2024 4.6  4.0 - 10.5 K/uL  Final   RBC 01/25/2024 3.72 (L)  4.22 - 5.81 MIL/uL Final   Hemoglobin 01/25/2024 11.7 (L)  13.0 - 17.0 g/dL Final   HCT 96/86/7974 35.1 (L)  39.0 - 52.0 % Final   MCV 01/25/2024 94.4  80.0 - 100.0 fL Final   MCH 01/25/2024 31.5  26.0 - 34.0 pg Final   MCHC 01/25/2024 33.3  30.0 - 36.0 g/dL Final   RDW 96/86/7974 13.8  11.5 - 15.5 % Final   Platelets 01/25/2024 292  150 - 400 K/uL Final   nRBC 01/25/2024 0.0  0.0 - 0.2 % Final   Performed at [facility], [address] [place], [place] [address]   Sodium 01/25/2024 138  135 - 145 mmol/L Final   Potassium 01/25/2024 4.3  3.5 - 5.1 mmol/L Final   Chloride 01/25/2024 104  98 - 111 mmol/L Final   CO2 01/25/2024 24  22 - 32 mmol/L Final   Glucose, Bld 01/25/2024 93  70 - 99 mg/dL Final   Glucose reference range applies only to samples taken after fasting for at least 8 hours.   BUN 01/25/2024 8  6 - 20 mg/dL Final   Creatinine, Ser 01/25/2024 0.79  0.61 - 1.24 mg/dL Final   Calcium  01/25/2024 9.2  8.9 - 10.3 mg/dL Final   GFR, Estimated 01/25/2024 >60  >60 mL/min Final   Comment: (NOTE) Calculated using  the CKD-EPI Creatinine Equation (2021)    Anion gap 01/25/2024 10  5 - 15 Final   Performed at [facility], [address] [place], [place] [address]  Admission on 01/18/2024, Discharged on 01/23/2024  Component Date Value Ref Range Status   POC Amphetamine UR 01/18/2024 None Detected  NONE DETECTED (Cut Off Level 1000 ng/mL) Final   POC Secobarbital (BAR) 01/18/2024 None Detected  NONE DETECTED (Cut Off Level 300 ng/mL) Final   POC Buprenorphine (BUP) 01/18/2024 None Detected  NONE DETECTED (Cut Off Level 10 ng/mL) Final   POC Oxazepam (BZO) 01/18/2024 Positive (A)  NONE DETECTED (Cut Off Level 300 ng/mL) Final   POC Cocaine UR 01/18/2024 None Detected  NONE DETECTED (Cut Off Level 300 ng/mL) Final   POC Methamphetamine UR 01/18/2024 None Detected  NONE DETECTED (Cut Off Level 1000 ng/mL) Final   POC Morphine  01/18/2024  None Detected  NONE DETECTED (Cut Off Level 300 ng/mL) Final   POC Methadone UR 01/18/2024 None Detected  NONE DETECTED (Cut Off Level 300 ng/mL) Final   POC Oxycodone  UR 01/18/2024 None Detected  NONE DETECTED (Cut Off Level 100 ng/mL) Final   POC Marijuana UR 01/18/2024 Positive (A)  NONE DETECTED (Cut Off Level 50 ng/mL) Final   Alcohol, Ethyl (B) 01/18/2024 <10  <10 mg/dL Final   Comment: (NOTE) Lowest detectable limit for serum alcohol is 10 mg/dL.  For medical purposes only. Performed at [facility], [address], [place], [place] [address]    Sodium 01/18/2024 138  135 - 145 mmol/L Final   Potassium 01/18/2024 3.7  3.5 - 5.1 mmol/L Final   Chloride 01/18/2024 95 (L)  98 - 111 mmol/L Final   CO2 01/18/2024 26  22 - 32 mmol/L Final   Glucose, Bld 01/18/2024 94  70 - 99 mg/dL Final   Glucose reference range applies only to samples taken after fasting for at least 8 hours.   BUN 01/18/2024 <5 (L)  6 - 20 mg/dL Final   Creatinine, Ser 01/18/2024 0.76  0.61 - 1.24 mg/dL Final   Calcium  01/18/2024 9.2  8.9 - 10.3 mg/dL Final   Total Protein 96/93/7974 7.9  6.5 - 8.1 g/dL Final   Albumin 96/93/7974 3.7  3.5 - 5.0 g/dL Final   AST 96/93/7974 68 (H)  15 - 41 U/L Final   ALT 01/18/2024 23  0 - 44 U/L Final   Alkaline Phosphatase 01/18/2024 111  38 - 126 U/L Final   Total Bilirubin 01/18/2024 1.4 (H)  0.0 - 1.2 mg/dL Final   GFR, Estimated 01/18/2024 >60  >60 mL/min Final   Comment: (NOTE) Calculated using the CKD-EPI Creatinine Equation (2021)    Anion gap 01/18/2024 17 (H)  5 - 15 Final   Performed at [facility], [address], [place], [place] [address]   Sodium 01/21/2024 134 (L)  135 - 145 mmol/L Final   Potassium 01/21/2024 4.1  3.5 - 5.1 mmol/L Final   Chloride 01/21/2024 99  98 - 111 mmol/L Final   CO2 01/21/2024 23  22 - 32 mmol/L Final   Glucose, Bld 01/21/2024 80  70 - 99 mg/dL Final   Glucose reference range applies only to samples taken after fasting  for at least 8 hours.   BUN 01/21/2024 8  6 - 20 mg/dL Final   Creatinine, Ser 01/21/2024 0.69  0.61 - 1.24 mg/dL Final   Calcium  01/21/2024 9.9  8.9 - 10.3 mg/dL Final   Total  Protein 01/21/2024 7.8  6.5 - 8.1 g/dL Final   Albumin 96/90/7974 3.8  3.5 - 5.0 g/dL Final   AST 96/90/7974 78 (H)  15 - 41 U/L Final   ALT 01/21/2024 33  0 - 44 U/L Final   Alkaline Phosphatase 01/21/2024 90  38 - 126 U/L Final   Total Bilirubin 01/21/2024 0.7  0.0 - 1.2 mg/dL Final   GFR, Estimated 01/21/2024 >60  >60 mL/min Final   Comment: (NOTE) Calculated using the CKD-EPI Creatinine Equation (2021)    Anion gap 01/21/2024 12  5 - 15 Final   Performed at [facility], [address], [place], [place] [address]   Prothrombin Time 01/21/2024 13.0  11.4 - 15.2 seconds Final   INR 01/21/2024 1.0  0.8 - 1.2 Final   Comment: (NOTE) INR goal varies based on device and disease states. Performed at [facility], [address], [place], [place] [address]    WBC 01/21/2024 5.5  4.0 - 10.5 K/uL Final   RBC 01/21/2024 3.75 (L)  4.22 - 5.81 MIL/uL Final   Hemoglobin 01/21/2024 11.9 (L)  13.0 - 17.0 g/dL Final   HCT 96/90/7974 36.1 (L)  39.0 - 52.0 % Final   MCV 01/21/2024 96.3  80.0 - 100.0 fL Final   MCH 01/21/2024 31.7  26.0 - 34.0 pg Final   MCHC 01/21/2024 33.0  30.0 - 36.0 g/dL Final   RDW 96/90/7974 14.3  11.5 - 15.5 % Final   Platelets 01/21/2024 261  150 - 400 K/uL Final   nRBC 01/21/2024 0.0  0.0 - 0.2 % Final   Performed at [facility], [address], [place], [place] [address]   Magnesium  01/21/2024 1.9  1.7 - 2.4 mg/dL Final   Performed at [facility], [address], [place], [place] [address]  Admission on 01/16/2024, Discharged on 01/17/2024  Component Date Value Ref Range Status   Sodium 01/17/2024 138  135 - 145 mmol/L Final   Potassium 01/17/2024 3.0 (L)  3.5 - 5.1 mmol/L Final   Chloride 01/17/2024 101  98 - 111 mmol/L Final   CO2 01/17/2024 23  22 - 32 mmol/L  Final   Glucose, Bld 01/17/2024 102 (H)  70 - 99 mg/dL Final   Glucose reference range applies only to samples taken after fasting for at least 8 hours.   BUN 01/17/2024 5 (L)  6 - 20 mg/dL Final   Creatinine, Ser 01/17/2024 0.57 (L)  0.61 - 1.24 mg/dL Final   Calcium  01/17/2024 8.4 (L)  8.9 - 10.3 mg/dL Final   Total Protein 96/94/7974 7.8  6.5 - 8.1 g/dL Final   Albumin 96/94/7974 3.8  3.5 - 5.0 g/dL Final   AST 96/94/7974 129 (H)  15 - 41 U/L Final   ALT 01/17/2024 27  0 - 44 U/L Final   Alkaline Phosphatase 01/17/2024 108  38 - 126 U/L Final   Total Bilirubin 01/17/2024 0.6  0.0 - 1.2 mg/dL Final   GFR, Estimated 01/17/2024 >60  >60 mL/min Final   Comment: (NOTE) Calculated using the CKD-EPI Creatinine Equation (2021)    Anion gap 01/17/2024 14  5 - 15 Final   Performed at [facility], [address] [place], [place] [address]   WBC 01/17/2024 3.6 (L)  4.0 - 10.5 K/uL Final   RBC 01/17/2024 3.54 (L)  4.22 - 5.81 MIL/uL Final   Hemoglobin 01/17/2024 10.9 (L)  13.0 - 17.0 g/dL Final  HCT 01/17/2024 33.8 (L)  39.0 - 52.0 % Final   MCV 01/17/2024 95.5  80.0 - 100.0 fL Final   MCH 01/17/2024 30.8  26.0 - 34.0 pg Final   MCHC 01/17/2024 32.2  30.0 - 36.0 g/dL Final   RDW 96/94/7974 14.8  11.5 - 15.5 % Final   Platelets 01/17/2024 218  150 - 400 K/uL Final   nRBC 01/17/2024 0.0  0.0 - 0.2 % Final   Neutrophils Relative % 01/17/2024 58  % Final   Neutro Abs 01/17/2024 2.1  1.7 - 7.7 K/uL Final   Lymphocytes Relative 01/17/2024 29  % Final   Lymphs Abs 01/17/2024 1.0  0.7 - 4.0 K/uL Final   Monocytes Relative 01/17/2024 10  % Final   Monocytes Absolute 01/17/2024 0.4  0.1 - 1.0 K/uL Final   Eosinophils Relative 01/17/2024 2  % Final   Eosinophils Absolute 01/17/2024 0.1  0.0 - 0.5 K/uL Final   Basophils Relative 01/17/2024 1  % Final   Basophils Absolute 01/17/2024 0.1  0.0 - 0.1 K/uL Final   Immature Granulocytes 01/17/2024 0  % Final   Abs Immature  Granulocytes 01/17/2024 0.01  0.00 - 0.07 K/uL Final   Performed at [facility], [address] [place], [place] [address]   Alcohol, Ethyl (B) 01/17/2024 499 (HH)  <10 mg/dL Final   Comment: CRITICAL RESULT CALLED TO, READ BACK BY AND VERIFIED WITH MAYWEATHER, S. RN AT 0132 ON 3.5.25. FA (NOTE) Lowest detectable limit for serum alcohol is 10 mg/dL.  For medical purposes only. Performed at [facility], [address], [place], [place] [address]   There may be more visits with results that are not included.    Blood Alcohol level:  Lab Results  Component Value Date   [facility] <15 05/27/2024   ETH <15 05/20/2024    Metabolic Disorder Labs: Lab Results  Component Value Date   HGBA1C 4.4 (L) 05/20/2024   MPG 79.58 05/20/2024   MPG 82.45 01/29/2024   No results found for: PROLACTIN Lab Results  Component Value Date   CHOL 208 (H) 05/20/2024   TRIG 168 (H) 05/20/2024   HDL 79 05/20/2024   CHOLHDL 2.6 05/20/2024   VLDL 34 05/20/2024   LDLCALC 95 05/20/2024   LDLCALC 74 04/04/2024    Therapeutic Lab Levels: No results found for: LITHIUM No results found for: VALPROATE No results found for: CBMZ  Physical Findings   AIMS    Flowsheet Row [facility]  AIMS Total Score 0   AUDIT    Flowsheet Row Office Visit from 04/22/2024 in [facility] Admission (Discharged) from [facility] Admission (Discharged) from [facility]24 [facility] Ad[facility] (Discharged) [facility]  Alcohol Use Disorder Identification Test Final Score (AUDIT) 6 10 0 29   CAGE-AID    Flowsheet Row ED to Hosp-Admission (Discharged) from 06/22/2021 in [facility]  CAGE-AID Score 3   GAD-7    Flowsheet Row  Office Visit from 04/22/2024 in [facility]  Total GAD-7 Score 0   Mini-Mental    Flowsheet Row Office Visit from 05/02/2024 in [facility]  Total Score (max 30 points ) 21   PHQ2-9    Flowsheet Row ED from 05/27/2024 in [facility] Office Visit  from 04/22/2024 in [facility] Office Visit from 02/07/2024 in [facility] [place] ED from 01/18/2024 in [facility] Office Visit from 06/10/2022 in [facility] [place]  PHQ-2 Total Score 6 0 0 0 0  PHQ-9 Total Score 12 0 -- 3 --   Flowsheet Row ED from 05/27/2024 in [facility] Most recent reading at 05/27/2024 11:10 PM ED from 05/27/2024 in [facility] Most recent reading at 05/27/2024  6:22 PM ED to Hosp-Admission (Discharged) from 05/20/2024 in [facility] Most recent reading at 05/20/2024 10:08 AM  C-SSRS RISK CATEGORY No Risk No Risk No Risk     Musculoskeletal  Strength & Muscle Tone: within normal limits Gait & Station: normal Patient leans: N/A  Psychiatric Specialty Exam  Presentation  General Appearance:  Fairly Groomed  Eye Contact: Fair  Speech: Clear and Coherent  Speech Volume: Normal  Handedness: Right   Mood and Affect  Mood: Anxious; Depressed  Affect: Congruent   Thought Process  Thought Processes: Coherent  Descriptions of Associations:Intact  Orientation:Full (Time, Place and Person)  Thought Content:Logical  Diagnosis of Schizophrenia or Schizoaffective disorder in past: No    Hallucinations:Hallucinations: None  Ideas of Reference:None  Suicidal Thoughts:Suicidal Thoughts: No  Homicidal Thoughts:Homicidal Thoughts: No   Sensorium  Memory: Immediate  Fair  Judgment: Fair  Insight: Fair   Art therapist  Concentration: Fair  Attention Span: Fair  Recall: -- (impaired)  Fund of Knowledge: Fair  Language: Fair   Psychomotor Activity  Psychomotor Activity: Psychomotor Activity: Normal   Assets  Assets: Resilience; Social Support   Sleep  Sleep: Sleep: Fair  Estimated Sleeping Duration (Last 24 Hours): 13.75 hours  No data recorded  Physical Exam  Physical Exam Vitals and nursing note reviewed.  Musculoskeletal:        General: Normal range of motion.  Neurological:     General: No focal deficit present.     Mental Status: He is oriented to person, place, and time.    Review of Systems  Psychiatric/Behavioral:  Positive for depression and substance abuse. Negative for hallucinations, memory loss and suicidal ideas. The patient has insomnia. The patient is not nervous/anxious.   All other systems reviewed and are negative.  Blood pressure 112/71, pulse 94, temperature 97.8 F (36.6 C), temperature source Oral, resp. rate 16, SpO2 98%. There is no height or weight on file to calculate BMI.  Treatment Plan Summary: Daily contact with patient to assess and evaluate symptoms and progress in treatment and Medication management  Safety and Monitoring: Voluntary admission to inpatient psychiatric unit for safety, stabilization and treatment Daily contact with patient to assess and evaluate symptoms and progress in treatment Patient's case to be discussed in multi-disciplinary team meeting Observation Level : q15 minute checks Vital signs: q12 hours Precautions: Safety  Long Term Goal(s): Improvement in symptoms so as ready for discharge  Short Term Goals: Ability to identify changes in lifestyle to reduce recurrence of condition will improve, Ability to verbalize feelings will improve, Ability to disclose and discuss suicidal ideas, Ability to demonstrate self-control will improve, Ability to  identify and develop effective coping behaviors will improve, Ability to maintain clinical measurements within normal limits will improve, Compliance with prescribed medications will improve, and Ability to identify triggers associated with substance abuse/mental health issues  will improve  Diagnoses Principal Problem:   Alcohol use disorder Active Problems:   History of seizure due to alcohol withdrawal   Gastroesophageal reflux disease without esophagitis   Hyperlipidemia  Medications: -Last dose of Ativan  taper was on 05/31/2024-As per attending Psychiatrist's note from yesterday, Given patient's complicated withdrawal history we will continue continue to monitor closely (including CIWA) for 48-72hrs. -Continue Cymbalta  60 mg daily for depressive symptoms and GAD -Continue Seroquel  100 mg at nightly for mood stabilization -Continue gabapentin  600 mg 3 times daily for GAD and alcohol use. - Continue Lipitor 40 mg nightly for hypercholesterolemia - Continue trazodone  100 mg nightly for sleep -Give one-time dose Mg Citrate for constipation - Start Mg Oxide 500 mg BID for brain & bone health (Substituted since [place] does not carry Magnesium  Glycinate).  PRNS -Continue Tylenol  650 mg every 6 hours PRN for mild pain -Continue Maalox 30 mg every 4 hrs PRN for indigestion -Continue Milk of Magnesia as needed every 6 hrs for constipation -Continue Agitation protocol meds as per the MAR-See MAR for details  Labs: As per attending Psychiatrist yesterday, Will order POC glucose x 2 days, if abnormal followup with A1c, insulin on Monday. Patient has history of hypomagnesemia. Consider starting Mg preferably glycinate for deficiency.Discontinue Fe.- POC  glucose x 2 days was WNL at 94 & 99 (both were completed today). Fe was dc'd yesterday. Will start Mg Glycinate beginning tomorrow morning, and will give Mg Citrate x 1 dose today due to complaints of no BM since hospitalization-pt reports  +flatus, abd. Is nontender, non distended.  Discharge Planning: Social work and case management to assist with discharge planning and identification of hospital follow-up needs prior to discharge Estimated LOS: 3-4 days Discharge Concerns: Need to establish a safety plan; Medication compliance and effectiveness Discharge Goals: Return home with outpatient referrals for mental health follow-up including medication management/psychotherapy  I certify that inpatient services furnished can reasonably be expected to improve the patient's condition.    Donia Snell, NP 7/19/20253:49 PM    Donia Snell, NP 06/01/2024 3:49 PM

## 2024-06-01 NOTE — ED Notes (Signed)
 Patient is sleeping. Respirations equal and unlabored. No change in assessment or acuity. Routine safety checks conducted according to facility protocol.

## 2024-06-01 NOTE — ED Notes (Signed)
 Pt generally cooperative throughout shift. Pt currently socializing in dayroom with other pts. Pt denies continues s/s of nausea.

## 2024-06-01 NOTE — Group Note (Signed)
 Group Topic: Emotional Regulation  Group Date: 06/01/2024 Start Time: 1710 End Time: 1740 Facilitators: Daved Tinnie HERO, RN  Department: [facility]  Number of Participants: 7  Group Focus: loss/grief issues Treatment Modality:  Psychoeducation Interventions utilized were exploration Purpose: increase insight  Name: Xavier Owens Date of Birth: Jul 28, 1971  MR: [id number]    Level of Participation: active Quality of Participation: attentive and cooperative Interactions with others: gave feedback Mood/Affect: appropriate Triggers (if applicable): n/a Cognition: coherent/clear Progress: Significant Response: pt says he will help navigate grieving by going to work Plan: patient will be encouraged to attend future RN education groups  Patients Problems:  Patient Active Problem List   Diagnosis Date Noted   History of seizure due to alcohol withdrawal 05/30/2024   Gastroesophageal reflux disease without esophagitis 05/30/2024   Hyperlipidemia 05/30/2024   Schatzki's ring 05/23/2024   Encounter to discuss test results 05/22/2024   Non-cardiac chest pain 05/22/2024   Esophageal dysphagia 05/22/2024   Abnormal loss of weight 05/22/2024   Abnormal finding on GI tract imaging 05/22/2024   Chest pain 05/21/2024   Elevated troponin 05/21/2024   Hypotension 05/21/2024   Chest discomfort 05/20/2024   Electrolyte abnormality 05/20/2024   Alcohol withdrawal (HCC) 05/09/2024   AKI (acute kidney injury) (HCC) 05/09/2024   Leukocytosis 05/09/2024   Heat exhaustion, water deprivation 05/09/2024   Major depression, recurrent (HCC) 04/03/2024   Reactive depression 03/22/2024   Alcohol withdrawal syndrome with complication (HCC) 03/22/2024   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   Dehydration 03/21/2024   Acute lactic acidosis 03/21/2024   Delirium tremens (HCC) 03/18/2024   Nausea & vomiting 03/16/2024   Alcohol use disorder 03/16/2024   Tremor 03/16/2024    Neuropathy 03/16/2024   Mood disorder (HCC) 03/16/2024   Memory impairment 02/08/2024   Right knee pain 02/08/2024   Major depressive disorder, recurrent severe without psychotic features (HCC) 01/25/2024   Alcohol withdrawal seizure (HCC) 01/23/2024   Alcohol use disorder, severe, dependence (HCC) 01/18/2024   Healthcare maintenance 06/10/2022   Nightmares 12/21/2021   Subclinical hypothyroidism 12/21/2021   Substance induced mood disorder (HCC) 12/14/2021   Alcohol use disorder, moderate, dependence (HCC) 06/23/2021   Seizures (HCC) 06/22/2021   Elevated liver enzymes 04/19/2016   Essential hypertension 04/19/2016

## 2024-06-01 NOTE — ED Notes (Signed)
 Pt says he only ate one bite of lunch, reports minor nausea but decline prn medication. Pt reports s/s of anxiety, pt currently watching TV in dayroom.

## 2024-06-01 NOTE — ED Notes (Signed)
 Patient is sleeping. Respirations equal and unlabored, skin warm and dry. No change in assessment or acuity. Routine safety checks conducted according to facility protocol.

## 2024-06-01 NOTE — ED Notes (Signed)
 Pt declined scheduled magnesium  oxide and citrate, saying that he does not need it at this time.

## 2024-06-02 DIAGNOSIS — F331 Major depressive disorder, recurrent, moderate: Secondary | ICD-10-CM | POA: Diagnosis not present

## 2024-06-02 DIAGNOSIS — E785 Hyperlipidemia, unspecified: Secondary | ICD-10-CM | POA: Diagnosis not present

## 2024-06-02 DIAGNOSIS — K219 Gastro-esophageal reflux disease without esophagitis: Secondary | ICD-10-CM | POA: Diagnosis not present

## 2024-06-02 DIAGNOSIS — F1010 Alcohol abuse, uncomplicated: Secondary | ICD-10-CM | POA: Diagnosis not present

## 2024-06-02 MED ORDER — HYDROXYZINE HCL 25 MG PO TABS
50.0000 mg | ORAL_TABLET | Freq: Once | ORAL | Status: AC
  Administered 2024-06-02: 50 mg via ORAL
  Filled 2024-06-02: qty 2

## 2024-06-02 MED ORDER — ATORVASTATIN CALCIUM 40 MG PO TABS
40.0000 mg | ORAL_TABLET | Freq: Every day | ORAL | Status: DC
  Administered 2024-06-02: 40 mg via ORAL
  Filled 2024-06-02: qty 1

## 2024-06-02 MED ORDER — HYDROXYZINE HCL 25 MG PO TABS
25.0000 mg | ORAL_TABLET | Freq: Three times a day (TID) | ORAL | Status: DC | PRN
  Administered 2024-06-03: 25 mg via ORAL
  Filled 2024-06-02 (×2): qty 1

## 2024-06-02 NOTE — ED Notes (Signed)
 Patient is resting in bed at this time.  No distress or complaint.  Denies signs or symptoms or withdrawal and no seizure activity noted.  Will monitor and provide support as needed.

## 2024-06-02 NOTE — ED Notes (Signed)
 Patient is sleeping. Respirations equal and unlabored, skin warm and dry. No change in assessment or acuity. Routine safety checks conducted according to facility protocol.

## 2024-06-02 NOTE — Group Note (Signed)
 Group Topic: Decisional Balance/Substance Abuse  Group Date: 06/02/2024 Start Time: 0800 End Time: 0900 Facilitators: Lonzell Dwayne RAMAN, NT  Department: [facility]  Number of Participants: 8  Group Focus: chemical dependency issues Treatment Modality:  Patient-Centered Therapy Interventions utilized were patient education Purpose: reinforce self-care  Name: Xavier Owens Date of Birth: 1970/11/17  MR: [id number]    Level of Participation: Patient attended groupactive Quality of Participation: attentive Interactions with others: gave feedback Mood/Affect: positive Triggers (if applicable): N/A Cognition: coherent/clear Progress: Moderate Response: appropriate  Plan: patient will be encouraged to continue with treatment plan.  Patients Problems:  Patient Active Problem List   Diagnosis Date Noted   History of seizure due to alcohol withdrawal 05/30/2024   Gastroesophageal reflux disease without esophagitis 05/30/2024   Hyperlipidemia 05/30/2024   Schatzki's ring 05/23/2024   Encounter to discuss test results 05/22/2024   Non-cardiac chest pain 05/22/2024   Esophageal dysphagia 05/22/2024   Abnormal loss of weight 05/22/2024   Abnormal finding on GI tract imaging 05/22/2024   Chest pain 05/21/2024   Elevated troponin 05/21/2024   Hypotension 05/21/2024   Chest discomfort 05/20/2024   Electrolyte abnormality 05/20/2024   Alcohol withdrawal (HCC) 05/09/2024   AKI (acute kidney injury) (HCC) 05/09/2024   Leukocytosis 05/09/2024   Heat exhaustion, water deprivation 05/09/2024   Major depression, recurrent (HCC) 04/03/2024   Reactive depression 03/22/2024   Alcohol withdrawal syndrome with complication (HCC) 03/22/2024   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   Dehydration 03/21/2024   Acute lactic acidosis 03/21/2024   Delirium tremens (HCC) 03/18/2024   Nausea & vomiting 03/16/2024   Alcohol use disorder 03/16/2024   Tremor 03/16/2024    Neuropathy 03/16/2024   Mood disorder (HCC) 03/16/2024   Memory impairment 02/08/2024   Right knee pain 02/08/2024   Major depressive disorder, recurrent severe without psychotic features (HCC) 01/25/2024   Alcohol withdrawal seizure (HCC) 01/23/2024   Alcohol use disorder, severe, dependence (HCC) 01/18/2024   Healthcare maintenance 06/10/2022   Nightmares 12/21/2021   Subclinical hypothyroidism 12/21/2021   Substance induced mood disorder (HCC) 12/14/2021   Alcohol use disorder, moderate, dependence (HCC) 06/23/2021   Seizures (HCC) 06/22/2021   Elevated liver enzymes 04/19/2016   Essential hypertension 04/19/2016

## 2024-06-02 NOTE — ED Provider Notes (Signed)
 Behavioral Health Progress Note  Date and Time: 06/02/2024 5:01 PM Name: Xavier Owens MRN:  [id number]  HPI: Xavier Owens is a 53 y.o. male who [facility] [place] [facility] with worsening depressive symptoms & SI as well as alcohol withdrawals symptoms. Per triage:  Pt presents to [facility] unaccompanied. Pt states he is having withdrawls from alcohol starting this morning. Pt reports that he drank yesterday and all weekend. Pt mentions he had a fifth of alcohol yesterday. Pt mentions he is drinking every weekend.  Patient assessment note, 06/02/2024: On assessment today, the pt reports that their mood is still depressed, but much improved as compared to yesterday. He is observed to be laughing and is more jovial than yesterday, and is more interactive with staff and peers as compared to yesterday. Sleep was fair and improved last night as compared to night prior.  Appetite is fair, but improving. Concentration is fair.  Energy level is low as per patient due to poor sleep.  Denies suicidal thoughts. Denies suicidal intent and plan.  Denies having any HI.  Denies having psychotic symptoms. There are no overt signs of psychosis, denies AVH, denies paranoia, denies first rank symptoms.  Denies having side effects to current psychiatric medications.   We discussed changes to current medication regimen, change in his antidepressant medication regimen, but patient is persistent to continuing Seroquel  or trazodone , does not want Cymbalta  discontinued here, also wanting the dose to stay the same at 60 mg, when offered a dose of 90 mg due to his continuous reports of depression. We will defer further medication management to outpatient psychiatric team.  Patient is persistent on getting hydroxyzine , and has been educated on the anticholinergic hydroxyzine  for his age, verbalizes understanding.  Persistent alternating this medication, so writer ordered hydroxyzine  25 mg 3  times daily as needed for anxiety.  All other medications are continued with no changes at this time.  We are continuing to monitor, to discharge date for patient is 06/04/24.  Discharge to be coordinated by CSW.  Diagnosis:  Final diagnoses:  Hyperlipidemia, unspecified hyperlipidemia type  Gastroesophageal reflux disease without esophagitis  Alcohol use disorder  Moderate episode of recurrent major depressive disorder (HCC)  History of seizure due to alcohol withdrawal   Total Time spent with patient: 45 minutes  Additional Social History:    Pain Medications: See MAR Prescriptions: See MAR Over the Counter: See MAR History of alcohol / drug use?: Yes Longest period of sobriety (when/how long): Unsure. Negative Consequences of Use:  (Unsure.) Withdrawal Symptoms: Seizures, Irritability, Nausea / Vomiting Onset of Seizures: Unknown Date of most recent seizure: sometime this year, per his report    Sleep: Poor  Appetite:  Fair  Current Medications:  Current Facility-Administered Medications  Medication Dose Route Frequency Provider Last Rate Last Admin   acetaminophen  (TYLENOL ) tablet 650 mg  650 mg Oral Q6H PRN Trudy Carwin, NP       alum & mag hydroxide-simeth (MAALOX/MYLANTA) 200-200-20 MG/5ML suspension 30 mL  30 mL Oral Q4H PRN Trudy Carwin, NP       atorvastatin  (LIPITOR) tablet 40 mg  40 mg Oral QPM Timberly Yott, NP   40 mg at 06/01/24 1750   DULoxetine  (CYMBALTA ) DR capsule 60 mg  60 mg Oral Daily Iris Hairston, NP   60 mg at 06/02/24 9063   folic acid  (FOLVITE ) tablet 1 mg  1 mg Oral Daily Alayne Estrella, NP   1 mg at 06/02/24 9063   gabapentin  (  NEURONTIN ) capsule 600 mg  600 mg Oral TID Tex Drilling, NP   600 mg at 06/02/24 1514   hydrOXYzine  (ATARAX ) tablet 25 mg  25 mg Oral TID PRN Tex Drilling, NP       magnesium  citrate solution 1 Bottle  1 Bottle Oral Once Lya Holben, NP       magnesium  hydroxide (MILK OF MAGNESIA) suspension 30 mL  30 mL Oral  Daily PRN Trudy Carwin, NP       magnesium  oxide (MAG-OX) tablet 400 mg  400 mg Oral BID Susette Seminara, NP   400 mg at 06/02/24 0936   melatonin tablet 3 mg  3 mg Oral QHS Bethea, Terrence C, MD   3 mg at 06/01/24 2114   multivitamin with minerals tablet 1 tablet  1 tablet Oral Daily Tex Drilling, NP   1 tablet at 06/02/24 9063   naltrexone  (DEPADE) tablet 50 mg  50 mg Oral Daily Leeman Johnsey, NP   50 mg at 06/02/24 9063   OLANZapine  (ZYPREXA ) injection 10 mg  10 mg Intramuscular TID PRN Trudy Carwin, NP       OLANZapine  (ZYPREXA ) injection 5 mg  5 mg Intramuscular TID PRN Trudy Carwin, NP       OLANZapine  zydis (ZYPREXA ) disintegrating tablet 5 mg  5 mg Oral TID PRN Trudy Carwin, NP       pantoprazole  (PROTONIX ) EC tablet 40 mg  40 mg Oral Daily Wilmer Berryhill, NP   40 mg at 06/02/24 0936   QUEtiapine  (SEROQUEL ) tablet 100 mg  100 mg Oral QHS Tex Drilling, NP   100 mg at 06/01/24 2114   thiamine  (VITAMIN B1) tablet 100 mg  100 mg Oral Daily Cole Salles C, MD   100 mg at 06/02/24 9063   traZODone  (DESYREL ) tablet 100 mg  100 mg Oral QHS Tex Drilling, NP   100 mg at 06/01/24 2114   Current Outpatient Medications  Medication Sig Dispense Refill   atorvastatin  (LIPITOR) 40 MG tablet Take 1 tablet (40 mg total) by mouth daily. 30 tablet 0   Cholecalciferol  (VITAMIN D -3 PO) Take 1 capsule by mouth daily.     DULoxetine  (CYMBALTA ) 60 MG capsule Take 1 capsule (60 mg total) by mouth daily. 30 capsule 3   ferrous sulfate  325 (65 FE) MG tablet Take 1 tablet (325 mg total) by mouth at bedtime. 30 tablet 0   folic acid  (FOLVITE ) 1 MG tablet Take 1 tablet (1 mg total) by mouth daily. 30 tablet 0   gabapentin  (NEURONTIN ) 300 MG capsule Take 600 mg by mouth 3 (three) times daily.     hydrOXYzine  (ATARAX ) 25 MG tablet Take 1 tablet (25 mg total) by mouth every 6 (six) hours as needed for anxiety (or CIWA score </= 10). 90 tablet 3   Multiple Vitamin (MULTIVITAMIN WITH MINERALS) TABS tablet  Take 1 tablet by mouth daily.     naltrexone  (DEPADE) 50 MG tablet Take 1 tablet (50 mg total) by mouth daily. 30 tablet 3   pantoprazole  (PROTONIX ) 40 MG tablet Take 1 tablet (40 mg total) by mouth daily. 30 tablet 2   propranolol  (INDERAL ) 10 MG tablet Take 10 mg by mouth 2 (two) times daily.     QUEtiapine  (SEROQUEL ) 50 MG tablet Take 1 tablet (50 mg total) by mouth at bedtime. 30 tablet 0   thiamine  (VITAMIN B1) 100 MG tablet Take 100 mg by mouth daily.     traZODone  (DESYREL ) 50 MG tablet Take 1 tablet (50 mg  total) by mouth at bedtime. 30 tablet 0    Labs  Lab Results:  Admission on 05/27/2024  Component Date Value Ref Range Status   SARS Coronavirus 2 by RT PCR 05/30/2024 NEGATIVE  NEGATIVE Final   Performed at [facility], [address], [place], [place] [address]   WBC 05/27/2024 8.2  4.0 - 10.5 K/uL Final   RBC 05/27/2024 4.86  4.22 - 5.81 MIL/uL Final   Hemoglobin 05/27/2024 13.9  13.0 - 17.0 g/dL Final   HCT 92/85/7974 41.7  39.0 - 52.0 % Final   MCV 05/27/2024 85.8  80.0 - 100.0 fL Final   MCH 05/27/2024 28.6  26.0 - 34.0 pg Final   MCHC 05/27/2024 33.3  30.0 - 36.0 g/dL Final   RDW 92/85/7974 14.3  11.5 - 15.5 % Final   Platelets 05/27/2024 346  150 - 400 K/uL Final   nRBC 05/27/2024 0.0  0.0 - 0.2 % Final   Neutrophils Relative % 05/27/2024 64  % Final   Neutro Abs 05/27/2024 5.3  1.7 - 7.7 K/uL Final   Lymphocytes Relative 05/27/2024 18  % Final   Lymphs Abs 05/27/2024 1.5  0.7 - 4.0 K/uL Final   Monocytes Relative 05/27/2024 16  % Final   Monocytes Absolute 05/27/2024 1.3 (H)  0.1 - 1.0 K/uL Final   Eosinophils Relative 05/27/2024 1  % Final   Eosinophils Absolute 05/27/2024 0.1  0.0 - 0.5 K/uL Final   Basophils Relative 05/27/2024 1  % Final   Basophils Absolute 05/27/2024 0.1  0.0 - 0.1 K/uL Final   Immature Granulocytes 05/27/2024 0  % Final   Abs Immature Granulocytes 05/27/2024 0.03  0.00 - 0.07 K/uL Final   Performed at [facility] Lab, [address] [place], [place] [address]   Sodium 05/27/2024 132 (L)  135 - 145 mmol/L Final   Potassium 05/27/2024 3.2 (L)  3.5 - 5.1 mmol/L Final   Chloride 05/27/2024 94 (L)  98 - 111 mmol/L Final   CO2 05/27/2024 21 (L)  22 - 32 mmol/L Final   Glucose, Bld 05/27/2024 97  70 - 99 mg/dL Final   Glucose reference range applies only to samples taken after fasting for at least 8 hours.   BUN 05/27/2024 7  6 - 20 mg/dL Final   Creatinine, Ser 05/27/2024 0.93  0.61 - 1.24 mg/dL Final   Calcium  05/27/2024 10.0  8.9 - 10.3 mg/dL Final   Total Protein 92/85/7974 7.9  6.5 - 8.1 g/dL Final   Albumin 92/85/7974 4.2  3.5 - 5.0 g/dL Final   AST 92/85/7974 47 (H)  15 - 41 U/L Final   ALT 05/27/2024 24  0 - 44 U/L Final   Alkaline Phosphatase 05/27/2024 120  38 - 126 U/L Final   Total Bilirubin 05/27/2024 0.9  0.0 - 1.2 mg/dL Final   GFR, Estimated 05/27/2024 >60  >60 mL/min Final   Comment: (NOTE) Calculated using the CKD-EPI Creatinine Equation (2021)    Anion gap 05/27/2024 17 (H)  5 - 15 Final   Performed at [facility], [address] [place], [place] [address]   Alcohol, Ethyl (B) 05/27/2024 <15  <15 mg/dL Final   Comment: (NOTE) For medical purposes only. Performed at [facility] Lab, [address] [place], [place] [address]    TSH 05/27/2024 3.436  0.350 - 4.500 uIU/mL Final   Comment: Performed by a 3rd Generation assay with a functional sensitivity of <=0.01 uIU/mL. Performed  at [facility] Lab, [address], [place], [place] [address]    POC Amphetamine UR 05/30/2024 None Detected  NONE DETECTED (Cut Off Level 1000 ng/mL) Final   POC Secobarbital (BAR) 05/30/2024 None Detected  NONE DETECTED (Cut Off Level 300 ng/mL) Final   POC Buprenorphine (BUP) 05/30/2024 None Detected  NONE DETECTED (Cut Off Level 10 ng/mL) Final   POC Oxazepam (BZO) 05/30/2024 Positive (A)  NONE DETECTED (Cut Off Level 300 ng/mL) Final   POC Cocaine UR 05/30/2024 None Detected  NONE DETECTED (Cut Off  Level 300 ng/mL) Final   POC Methamphetamine UR 05/30/2024 None Detected  NONE DETECTED (Cut Off Level 1000 ng/mL) Final   POC Morphine  05/30/2024 None Detected  NONE DETECTED (Cut Off Level 300 ng/mL) Final   POC Methadone UR 05/30/2024 None Detected  NONE DETECTED (Cut Off Level 300 ng/mL) Final   POC Oxycodone  UR 05/30/2024 None Detected  NONE DETECTED (Cut Off Level 100 ng/mL) Final   POC Marijuana UR 05/30/2024 Positive (A)  NONE DETECTED (Cut Off Level 50 ng/mL) Final   Color, Urine 05/30/2024 YELLOW  YELLOW Final   APPearance 05/30/2024 CLEAR  CLEAR Final   Specific Gravity, Urine 05/30/2024 1.004 (L)  1.005 - 1.030 Final   pH 05/30/2024 7.0  5.0 - 8.0 Final   Glucose, UA 05/30/2024 NEGATIVE  NEGATIVE mg/dL Final   Hgb urine dipstick 05/30/2024 SMALL (A)  NEGATIVE Final   Bilirubin Urine 05/30/2024 NEGATIVE  NEGATIVE Final   Ketones, ur 05/30/2024 NEGATIVE  NEGATIVE mg/dL Final   Protein, ur 92/82/7974 NEGATIVE  NEGATIVE mg/dL Final   Nitrite 92/82/7974 NEGATIVE  NEGATIVE Final   Leukocytes,Ua 05/30/2024 NEGATIVE  NEGATIVE Final   RBC / HPF 05/30/2024 0-5  0 - 5 RBC/hpf Final   WBC, UA 05/30/2024 0-5  0 - 5 WBC/hpf Final   Bacteria, UA 05/30/2024 NONE SEEN  NONE SEEN Final   Squamous Epithelial / HPF 05/30/2024 0-5  0 - 5 /HPF Final   Performed at [facility] Lab, [address] [place], [place] [address]   Sodium 05/30/2024 134 (L)  135 - 145 mmol/L Final   Potassium 05/30/2024 3.6  3.5 - 5.1 mmol/L Final   Chloride 05/30/2024 95 (L)  98 - 111 mmol/L Final   CO2 05/30/2024 27  22 - 32 mmol/L Final   Glucose, Bld 05/30/2024 48 (L)  70 - 99 mg/dL Final   Glucose reference range applies only to samples taken after fasting for at least 8 hours.   BUN 05/30/2024 8  6 - 20 mg/dL Final   Creatinine, Ser 05/30/2024 0.82  0.61 - 1.24 mg/dL Final   Calcium  05/30/2024 9.6  8.9 - 10.3 mg/dL Final   GFR, Estimated 05/30/2024 >60  >60 mL/min Final   Comment: (NOTE) Calculated using the  CKD-EPI Creatinine Equation (2021)    Anion gap 05/30/2024 12  5 - 15 Final   Performed at [facility] Lab, [address], [place], [place] [address]   Vit D, 25-Hydroxy 05/30/2024 59.65  30 - 100 ng/mL Final   Comment: (NOTE) Vitamin D  deficiency has been defined by the Institute of Medicine  and an Endocrine Society practice guideline as a level of serum 25-OH  vitamin D  less than 20 ng/mL (1,2). The Endocrine Society went on to  further define vitamin D  insufficiency as a level between 21 and 29  ng/mL (2).  1. IOM (Institute of Medicine). 2010. Dietary reference intakes for  calcium  and D. [place]: [employer]. 2. Holick MF, Binkley Pike, Bischoff-Ferrari HA, et al. Evaluation,  treatment, and prevention of vitamin D  deficiency: an Endocrine  Society clinical practice guideline, JCEM. 2011 Jul; 96(7): 1911-30.  Performed at [facility] Lab, [address] [place], [place] [address]    Vitamin B-12 05/30/2024 393  180 - 914 pg/mL Final   Comment: (NOTE) This assay is not validated for testing neonatal or myeloproliferative syndrome specimens for Vitamin B12 levels. Performed at [facility], [address], [place], [place] [address]    Glucose-Capillary 05/31/2024 100 (H)  70 - 99 mg/dL Final   Glucose reference range applies only to samples taken after fasting for at least 8 hours.   Glucose-Capillary 06/01/2024 99  70 - 99 mg/dL Final   Glucose reference range applies only to samples taken after fasting for at least 8 hours.   Glucose-Capillary 06/01/2024 94  70 - 99 mg/dL Final   Glucose reference range applies only to samples taken after fasting for at least 8 hours.   Glucose-Capillary 06/01/2024 105 (H)  70 - 99 mg/dL Final   Glucose reference range applies only to samples taken after fasting for at least 8 hours.  Admission on 05/20/2024, Discharged on 05/24/2024  Component Date Value Ref Range Status   WBC 05/20/2024 15.9 (H)  4.0 -  10.5 K/uL Final   RBC 05/20/2024 4.91  4.22 - 5.81 MIL/uL Final   Hemoglobin 05/20/2024 13.8  13.0 - 17.0 g/dL Final   HCT 92/92/7974 42.8  39.0 - 52.0 % Final   MCV 05/20/2024 87.2  80.0 - 100.0 fL Final   MCH 05/20/2024 28.1  26.0 - 34.0 pg Final   MCHC 05/20/2024 32.2  30.0 - 36.0 g/dL Final   RDW 92/92/7974 14.2  11.5 - 15.5 % Final   Platelets 05/20/2024 353  150 - 400 K/uL Final   nRBC 05/20/2024 0.0  0.0 - 0.2 % Final   Performed at [facility] Lab, [address], [place], [place] [address]   Sodium 05/20/2024 135  135 - 145 mmol/L Final   Potassium 05/20/2024 3.3 (L)  3.5 - 5.1 mmol/L Final   Chloride 05/20/2024 99  98 - 111 mmol/L Final   CO2 05/20/2024 11 (L)  22 - 32 mmol/L Final   Glucose, Bld 05/20/2024 87  70 - 99 mg/dL Final   Glucose reference range applies only to samples taken after fasting for at least 8 hours.   BUN 05/20/2024 5 (L)  6 - 20 mg/dL Final   Creatinine, Ser 05/20/2024 1.16  0.61 - 1.24 mg/dL Final   Calcium  05/20/2024 9.5  8.9 - 10.3 mg/dL Final   Total Protein 92/92/7974 7.9  6.5 - 8.1 g/dL Final   Albumin 92/92/7974 3.8  3.5 - 5.0 g/dL Final   AST 92/92/7974 52 (H)  15 - 41 U/L Final   ALT 05/20/2024 21  0 - 44 U/L Final   Alkaline Phosphatase 05/20/2024 97  38 - 126 U/L Final   Total Bilirubin 05/20/2024 2.3 (H)  0.0 - 1.2 mg/dL Final   GFR, Estimated 05/20/2024 >60  >60 mL/min Final   Comment: (NOTE) Calculated using the CKD-EPI Creatinine Equation (2021)    Anion gap 05/20/2024 25 (H)  5 - 15 Corrected   Comment: REPEATED TO VERIFY Performed at [facility], [address] [place], [place] [address] CORRECTED ON 07/07 AT 1105: PREVIOUSLY REPORTED AS 25 CRITICAL RESULT CALLED TO, READ BACK  BY AND VERIFIED WITH DEVOLT,TRAVIS RN 1047 05/20/24 AMIREHSNAIF REPEATED TO VERIFY    Lipase 05/20/2024 32  11 - 51 U/L Final   Performed at [facility], [address], [place], [place] [address]   Troponin I (High Sensitivity) 05/20/2024  131 (HH)  <18 ng/L Final   Comment: CRITICAL RESULT CALLED TO, READ BACK BY AND VERIFIED WITH DEVOLT,TRAVIS RN 1047 05/20/24 AMIREHSNAIF (NOTE) Elevated high sensitivity troponin I (hsTnI) values and significant  changes across serial measurements may suggest ACS but many other  chronic and acute conditions are known to elevate hsTnI results.  Refer to the Links section for chest pain algorithms and additional  guidance. Performed at [facility], [address], [place], [place] [address]    Troponin I (High Sensitivity) 05/20/2024 76 (H)  <18 ng/L Final   Comment: (NOTE) Elevated high sensitivity troponin I (hsTnI) values and significant  changes across serial measurements may suggest ACS but many other  chronic and acute conditions are known to elevate hsTnI results.  Refer to the Links section for chest pain algorithms and additional  guidance. Performed at [facility], [address] [place], [place] [address]    pH, Ven 05/20/2024 7.443 (H)  7.25 - 7.43 Final   pCO2, Ven 05/20/2024 22.4 (L)  44 - 60 mmHg Final   pO2, Ven 05/20/2024 71 (H)  32 - 45 mmHg Final   Bicarbonate 05/20/2024 15.3 (L)  20.0 - 28.0 mmol/L Final   TCO2 05/20/2024 16 (L)  22 - 32 mmol/L Final   O2 Saturation 05/20/2024 95  % Final   Acid-base deficit 05/20/2024 6.0 (H)  0.0 - 2.0 mmol/L Final   Sodium 05/20/2024 128 (L)  135 - 145 mmol/L Final   Potassium 05/20/2024 5.8 (H)  3.5 - 5.1 mmol/L Final   Calcium , Ion 05/20/2024 0.94 (L)  1.15 - 1.40 mmol/L Final   HCT 05/20/2024 48.0  39.0 - 52.0 % Final   Hemoglobin 05/20/2024 16.3  13.0 - 17.0 g/dL Final   Sample type 92/92/7974 VENOUS   Final   Color, Urine 05/20/2024 AMBER (A)  YELLOW Final   BIOCHEMICALS MAY BE AFFECTED BY COLOR   APPearance 05/20/2024 HAZY (A)  CLEAR Final   Specific Gravity, Urine 05/20/2024 1.026  1.005 - 1.030 Final   pH 05/20/2024 5.0  5.0 - 8.0 Final   Glucose, UA 05/20/2024 NEGATIVE  NEGATIVE mg/dL Final   Hgb  urine dipstick 05/20/2024 SMALL (A)  NEGATIVE Final   Bilirubin Urine 05/20/2024 NEGATIVE  NEGATIVE Final   Ketones, ur 05/20/2024 80 (A)  NEGATIVE mg/dL Final   Protein, ur 92/92/7974 100 (A)  NEGATIVE mg/dL Final   Nitrite 92/92/7974 NEGATIVE  NEGATIVE Final   Leukocytes,Ua 05/20/2024 NEGATIVE  NEGATIVE Final   RBC / HPF 05/20/2024 0-5  0 - 5 RBC/hpf Final   WBC, UA 05/20/2024 0-5  0 - 5 WBC/hpf Final   Bacteria, UA 05/20/2024 RARE (A)  NONE SEEN Final   Squamous Epithelial / HPF 05/20/2024 0-5  0 - 5 /HPF Final   Mucus 05/20/2024 PRESENT   Final   Hyaline Casts, UA 05/20/2024 PRESENT   Final   Performed at [facility], [address], [place], [place] [address]   Osmolality 05/20/2024 279  275 - 295 mOsm/kg Final   Comment: REPEATED TO VERIFY Performed at [facility], [address], [place], [place] [address]    Weight 05/20/2024 2,528  oz Final  Height 05/20/2024 68  in Final   BP 05/20/2024 122/71  mmHg Final   S' Lateral 05/20/2024 2.50  cm Final   Area-P 1/2 05/20/2024 4.96  cm2 Final   Est EF 05/20/2024 60 - 65%   Final   Cholesterol 05/20/2024 208 (H)  0 - 200 mg/dL Final   Triglycerides 92/92/7974 168 (H)  <150 mg/dL Final   HDL 92/92/7974 79  >40 mg/dL Final   Total CHOL/HDL Ratio 05/20/2024 2.6  RATIO Final   VLDL 05/20/2024 34  0 - 40 mg/dL Final   LDL Cholesterol 05/20/2024 95  0 - 99 mg/dL Final   Comment:        Total Cholesterol/HDL:CHD Risk Coronary Heart Disease Risk Table                     Men   Women  1/2 Average Risk   3.4   3.3  Average Risk       5.0   4.4  2 X Average Risk   9.6   7.1  3 X Average Risk  23.4   11.0        Use the calculated Patient Ratio above and the CHD Risk Table to determine the patient's CHD Risk.        ATP III CLASSIFICATION (LDL):  <100     mg/dL   Optimal  899-870  mg/dL   Near or Above                    Optimal  130-159  mg/dL   Borderline  839-810  mg/dL   High  >809     mg/dL   Very  High Performed at [facility] Lab, [address], [place], [place] [address]    Hgb A1c MFr Bld 05/20/2024 4.4 (L)  4.8 - 5.6 % Final   Comment: (NOTE) Diagnosis of Diabetes The following HbA1c ranges recommended by the American Diabetes Association (ADA) may be used as an aid in the diagnosis of diabetes mellitus.  Hemoglobin             Suggested A1C NGSP%              Diagnosis  <5.7                   Non Diabetic  5.7-6.4                Pre-Diabetic  >6.4                   Diabetic  <7.0                   Glycemic control for                       adults with diabetes.     Mean Plasma Glucose 05/20/2024 79.58  mg/dL Final   Performed at [facility] Lab, [address], [place], [place] [address]   CRP 05/20/2024 3.3 (H)  <1.0 mg/dL Final   Performed at [facility], [address] [place], [place] [address]   Sed Rate 05/20/2024 4  0 - 16 mm/hr Final   Performed at [facility] Lab, [address], [place], [place] [address]   Alcohol, Ethyl (B) 05/20/2024 <15  <15 mg/dL Final   Comment: (NOTE) For medical purposes only. Performed at [facility], [address]  [place], [place] [address]    Opiates 05/21/2024 NONE DETECTED  NONE DETECTED Final   Cocaine 05/21/2024 NONE DETECTED  NONE DETECTED Final   Benzodiazepines 05/21/2024 NONE DETECTED  NONE DETECTED Final   Amphetamines 05/21/2024 NONE DETECTED  NONE DETECTED Final   Tetrahydrocannabinol 05/21/2024 NONE DETECTED  NONE DETECTED Final   Barbiturates 05/21/2024 NONE DETECTED  NONE DETECTED Final   Comment: (NOTE) DRUG SCREEN FOR MEDICAL PURPOSES ONLY.  IF CONFIRMATION IS NEEDED FOR ANY PURPOSE, NOTIFY LAB WITHIN 5 DAYS.  LOWEST DETECTABLE LIMITS FOR URINE DRUG SCREEN Drug Class                     Cutoff (ng/mL) Amphetamine and metabolites    1000 Barbiturate and metabolites    200 Benzodiazepine                 200 Opiates and metabolites        300 Cocaine and metabolites         300 THC                            50 Performed at [facility] Lab, [address], [place], [place] [address]    Sodium 05/21/2024 134 (L)  135 - 145 mmol/L Final   Potassium 05/21/2024 3.1 (L)  3.5 - 5.1 mmol/L Final   Chloride 05/21/2024 103  98 - 111 mmol/L Final   CO2 05/21/2024 23  22 - 32 mmol/L Final   Glucose, Bld 05/21/2024 116 (H)  70 - 99 mg/dL Final   Glucose reference range applies only to samples taken after fasting for at least 8 hours.   BUN 05/21/2024 7  6 - 20 mg/dL Final   Creatinine, Ser 05/21/2024 0.91  0.61 - 1.24 mg/dL Final   Calcium  05/21/2024 9.0  8.9 - 10.3 mg/dL Final   GFR, Estimated 05/21/2024 >60  >60 mL/min Final   Comment: (NOTE) Calculated using the CKD-EPI Creatinine Equation (2021)    Anion gap 05/21/2024 8  5 - 15 Final   Performed at [facility] Lab, [address], [place], [place] [address]   Lactic Acid, Venous 05/20/2024 2.4 (HH)  0.5 - 1.9 mmol/L Final   Comment: CRITICAL RESULT CALLED TO, READ BACK BY AND VERIFIED WITH FABIENE BOWLER, RN AT 2128 07.07.25 JLASIGAN Performed at [facility] Lab, [address], [place], [place] [address]    WBC 05/21/2024 6.2  4.0 - 10.5 K/uL Final   RBC 05/21/2024 4.54  4.22 - 5.81 MIL/uL Final   Hemoglobin 05/21/2024 12.9 (L)  13.0 - 17.0 g/dL Final   HCT 92/91/7974 37.9 (L)  39.0 - 52.0 % Final   MCV 05/21/2024 83.5  80.0 - 100.0 fL Final   MCH 05/21/2024 28.4  26.0 - 34.0 pg Final   MCHC 05/21/2024 34.0  30.0 - 36.0 g/dL Final   RDW 92/91/7974 14.5  11.5 - 15.5 % Final   Platelets 05/21/2024 270  150 - 400 K/uL Final   nRBC 05/21/2024 0.0  0.0 - 0.2 % Final   Performed at [facility] Lab, [address], [place], [place] [address]   Lactic Acid, Venous 05/20/2024 2.4 (HH)  0.5 - 1.9 mmol/L Final   Comment: CRITICAL VALUE NOTED. VALUE IS CONSISTENT WITH PREVIOUSLY REPORTED/CALLED VALUE Performed at [facility] Lab, [address], [place], [place] [address]    Lactic Acid, Venous  05/21/2024 1.6  0.5 - 1.9 mmol/L Final   Performed at [facility] Lab, [address], [place], [place] [address]   Magnesium  05/21/2024 1.7  1.7 - 2.4 mg/dL Final   Performed at [facility], [address], [place], [place] [address]   WBC 05/22/2024 6.1  4.0 - 10.5 K/uL Final   RBC 05/22/2024 3.83 (L)  4.22 - 5.81 MIL/uL Final   Hemoglobin 05/22/2024 11.0 (L)  13.0 - 17.0 g/dL Final   HCT 92/90/7974 33.0 (L)  39.0 - 52.0 % Final   MCV 05/22/2024 86.2  80.0 - 100.0 fL Final   MCH 05/22/2024 28.7  26.0 - 34.0 pg Final   MCHC 05/22/2024 33.3  30.0 - 36.0 g/dL Final   RDW 92/90/7974 14.4  11.5 - 15.5 % Final   Platelets 05/22/2024 219  150 - 400 K/uL Final   nRBC 05/22/2024 0.0  0.0 - 0.2 % Final   Performed at [facility], [address], [place], [place] [address]   Sodium 05/22/2024 137  135 - 145 mmol/L Final   Potassium 05/22/2024 3.6  3.5 - 5.1 mmol/L Final   Chloride 05/22/2024 107  98 - 111 mmol/L Final   CO2 05/22/2024 24  22 - 32 mmol/L Final   Glucose, Bld 05/22/2024 100 (H)  70 - 99 mg/dL Final   Glucose reference range applies only to samples taken after fasting for at least 8 hours.   BUN 05/22/2024 5 (L)  6 - 20 mg/dL Final   Creatinine, Ser 05/22/2024 0.75  0.61 - 1.24 mg/dL Final   Calcium  05/22/2024 8.1 (L)  8.9 - 10.3 mg/dL Final   GFR, Estimated 05/22/2024 >60  >60 mL/min Final   Comment: (NOTE) Calculated using the CKD-EPI Creatinine Equation (2021)    Anion gap 05/22/2024 6  5 - 15 Final   Performed at [facility], [address] [place], [place] [address]   WBC 05/23/2024 6.3  4.0 - 10.5 K/uL Final   RBC 05/23/2024 4.58  4.22 - 5.81 MIL/uL Final   Hemoglobin 05/23/2024 12.8 (L)  13.0 - 17.0 g/dL Final   HCT 92/89/7974 39.5  39.0 - 52.0 % Final   MCV 05/23/2024 86.2  80.0 - 100.0 fL Final   MCH 05/23/2024 27.9  26.0 - 34.0 pg Final   MCHC 05/23/2024 32.4  30.0 - 36.0 g/dL Final   RDW 92/89/7974 14.6  11.5 - 15.5 % Final   Platelets  05/23/2024 252  150 - 400 K/uL Final   nRBC 05/23/2024 0.0  0.0 - 0.2 % Final   Performed at [facility], [address] [place], [place] [address]   Sodium 05/23/2024 139  135 - 145 mmol/L Final   Potassium 05/23/2024 3.4 (L)  3.5 - 5.1 mmol/L Final   Chloride 05/23/2024 102  98 - 111 mmol/L Final   CO2 05/23/2024 22  22 - 32 mmol/L Final   Glucose, Bld 05/23/2024 92  70 - 99 mg/dL Final   Glucose reference range applies only to samples taken after fasting for at least 8 hours.   BUN 05/23/2024 <5 (L)  6 - 20 mg/dL Final   Creatinine, Ser 05/23/2024 0.64  0.61 - 1.24 mg/dL Final   Calcium  05/23/2024 9.7  8.9 - 10.3 mg/dL Final   GFR, Estimated 05/23/2024 >60  >60 mL/min Final   Comment: (NOTE) Calculated using the CKD-EPI Creatinine Equation (2021)    Anion gap 05/23/2024 15  5 - 15  Final   Performed at [facility], [address] [place], [place] [address]   Magnesium  05/23/2024 1.6 (L)  1.7 - 2.4 mg/dL Final   Performed at [facility], [address] [place], [place] [address]   WBC 05/24/2024 5.1  4.0 - 10.5 K/uL Final   RBC 05/24/2024 4.36  4.22 - 5.81 MIL/uL Final   Hemoglobin 05/24/2024 12.6 (L)  13.0 - 17.0 g/dL Final   HCT 92/88/7974 38.0 (L)  39.0 - 52.0 % Final   MCV 05/24/2024 87.2  80.0 - 100.0 fL Final   MCH 05/24/2024 28.9  26.0 - 34.0 pg Final   MCHC 05/24/2024 33.2  30.0 - 36.0 g/dL Final   RDW 92/88/7974 14.7  11.5 - 15.5 % Final   Platelets 05/24/2024 237  150 - 400 K/uL Final   nRBC 05/24/2024 0.0  0.0 - 0.2 % Final   Performed at [facility], [address] [place], [place] [address]   Sodium 05/24/2024 136  135 - 145 mmol/L Final   Potassium 05/24/2024 3.6  3.5 - 5.1 mmol/L Final   Chloride 05/24/2024 103  98 - 111 mmol/L Final   CO2 05/24/2024 22  22 - 32 mmol/L Final   Glucose, Bld 05/24/2024 100 (H)  70 - 99 mg/dL Final   Glucose reference range applies only to samples taken after fasting for at least 8 hours.   BUN  05/24/2024 6  6 - 20 mg/dL Final   Creatinine, Ser 05/24/2024 0.61  0.61 - 1.24 mg/dL Final   Calcium  05/24/2024 9.4  8.9 - 10.3 mg/dL Final   Phosphorus 92/88/7974 4.9 (H)  2.5 - 4.6 mg/dL Final   Albumin 92/88/7974 3.3 (L)  3.5 - 5.0 g/dL Final   GFR, Estimated 05/24/2024 >60  >60 mL/min Final   Comment: (NOTE) Calculated using the CKD-EPI Creatinine Equation (2021)    Anion gap 05/24/2024 11  5 - 15 Final   Performed at [facility], [address], [place], [place] [address]  Admission on 05/08/2024, Discharged on 05/10/2024  Component Date Value Ref Range Status   Glucose-Capillary 05/08/2024 105 (H)  70 - 99 mg/dL Final   Glucose reference range applies only to samples taken after fasting for at least 8 hours.   Troponin I (High Sensitivity) 05/08/2024 6  <18 ng/L Final   Comment: (NOTE) Elevated high sensitivity troponin I (hsTnI) values and significant  changes across serial measurements may suggest ACS but many other  chronic and acute conditions are known to elevate hsTnI results.  Refer to the Links section for chest pain algorithms and additional  guidance. Performed at [facility], [address] [place], [place] [address]    WBC 05/08/2024 13.3 (H)  4.0 - 10.5 K/uL Final   RBC 05/08/2024 5.23  4.22 - 5.81 MIL/uL Final   Hemoglobin 05/08/2024 14.8  13.0 - 17.0 g/dL Final   HCT 93/74/7974 46.3  39.0 - 52.0 % Final   MCV 05/08/2024 88.5  80.0 - 100.0 fL Final   MCH 05/08/2024 28.3  26.0 - 34.0 pg Final   MCHC 05/08/2024 32.0  30.0 - 36.0 g/dL Final   RDW 93/74/7974 14.4  11.5 - 15.5 % Final   Platelets 05/08/2024 335  150 - 400 K/uL Final   nRBC 05/08/2024 0.0  0.0 - 0.2 % Final   Performed at [facility], [address], [place], [place] [address]   Sodium 05/08/2024 142  135 - 145 mmol/L Final   Potassium 05/08/2024 4.2  3.5 - 5.1 mmol/L Final   Chloride 05/08/2024 103  98 - 111 mmol/L Final   CO2 05/08/2024 10 (L)  22 - 32 mmol/L Final    Glucose, Bld 05/08/2024 99  70 - 99 mg/dL Final   Glucose reference range applies only to samples taken after fasting for at least 8 hours.   BUN 05/08/2024 8  6 - 20 mg/dL Final   Creatinine, Ser 05/08/2024 1.31 (H)  0.61 - 1.24 mg/dL Final   Calcium  05/08/2024 10.3  8.9 - 10.3 mg/dL Final   Total Protein 93/74/7974 9.4 (H)  6.5 - 8.1 g/dL Final   Albumin 93/74/7974 4.8  3.5 - 5.0 g/dL Final   AST 93/74/7974 35  15 - 41 U/L Final   ALT 05/08/2024 15  0 - 44 U/L Final   Alkaline Phosphatase 05/08/2024 109  38 - 126 U/L Final   Total Bilirubin 05/08/2024 2.4 (H)  0.0 - 1.2 mg/dL Final   GFR, Estimated 05/08/2024 >60  >60 mL/min Final   Comment: (NOTE) Calculated using the CKD-EPI Creatinine Equation (2021)    Anion gap 05/08/2024 29 (H)  5 - 15 Final   Comment: ELECTROLYTES REPEATED TO VERIFY Performed at [facility], [address] [place], [place] [address]    Troponin I (High Sensitivity) 05/08/2024 7  <18 ng/L Final   Comment: (NOTE) Elevated high sensitivity troponin I (hsTnI) values and significant  changes across serial measurements may suggest ACS but many other  chronic and acute conditions are known to elevate hsTnI results.  Refer to the Links section for chest pain algorithms and additional  guidance. Performed at [facility], [address] [place], [place] [address]    Lipase 05/08/2024 34  11 - 51 U/L Final   Performed at [facility], [address], [place], [place] [address]   Alcohol, Ethyl (B) 05/09/2024 <15  <15 mg/dL Final   Comment: (NOTE) For medical purposes only. Performed at [facility], [address] [place], [place] [address]    Lactic Acid, Venous 05/09/2024 1.2  0.5 - 1.9 mmol/L Final   Performed at [facility], [address] [place], [place] [address]   Color, Urine 05/09/2024 YELLOW  YELLOW Final   APPearance 05/09/2024 CLEAR  CLEAR Final   Specific Gravity, Urine 05/09/2024 1.039 (H)  1.005 - 1.030  Final   pH 05/09/2024 5.0  5.0 - 8.0 Final   Glucose, UA 05/09/2024 NEGATIVE  NEGATIVE mg/dL Final   Hgb urine dipstick 05/09/2024 MODERATE (A)  NEGATIVE Final   Bilirubin Urine 05/09/2024 NEGATIVE  NEGATIVE Final   Ketones, ur 05/09/2024 80 (A)  NEGATIVE mg/dL Final   Protein, ur 93/73/7974 >=300 (A)  NEGATIVE mg/dL Final   Nitrite 93/73/7974 NEGATIVE  NEGATIVE Final   Leukocytes,Ua 05/09/2024 NEGATIVE  NEGATIVE Final   RBC / HPF 05/09/2024 6-10  0 - 5 RBC/hpf Final   WBC, UA 05/09/2024 0-5  0 - 5 WBC/hpf Final   Bacteria, UA 05/09/2024 NONE SEEN  NONE SEEN Final   Squamous Epithelial / HPF 05/09/2024 6-10  0 - 5 /HPF Final   Mucus 05/09/2024 PRESENT   Final   Performed at [facility], [address], [place], [place] [address]   Sodium 05/09/2024 132 (L)  135 - 145 mmol/L Final   DELTA CHECK NOTED   Potassium 05/09/2024 3.6  3.5 - 5.1  mmol/L Final   Chloride 05/09/2024 102  98 - 111 mmol/L Final   CO2 05/09/2024 21 (L)  22 - 32 mmol/L Final   Glucose, Bld 05/09/2024 88  70 - 99 mg/dL Final   Glucose reference range applies only to samples taken after fasting for at least 8 hours.   BUN 05/09/2024 6  6 - 20 mg/dL Final   Creatinine, Ser 05/09/2024 0.74  0.61 - 1.24 mg/dL Final   Calcium  05/09/2024 9.5  8.9 - 10.3 mg/dL Final   GFR, Estimated 05/09/2024 >60  >60 mL/min Final   Comment: (NOTE) Calculated using the CKD-EPI Creatinine Equation (2021)    Anion gap 05/09/2024 9  5 - 15 Final   Performed at [facility], [address] [place], [place] [address]   Total Protein 05/09/2024 7.2  6.5 - 8.1 g/dL Final   Albumin 93/73/7974 3.8  3.5 - 5.0 g/dL Final   AST 93/73/7974 32  15 - 41 U/L Final   ALT 05/09/2024 15  0 - 44 U/L Final   Alkaline Phosphatase 05/09/2024 92  38 - 126 U/L Final   Total Bilirubin 05/09/2024 1.8 (H)  0.0 - 1.2 mg/dL Final   Bilirubin, Direct 05/09/2024 0.4 (H)  0.0 - 0.2 mg/dL Final   Indirect Bilirubin 05/09/2024 1.4 (H)  0.3 - 0.9 mg/dL  Final   Performed at [facility], [address] [place], [place] [address]   pH, Ven 05/09/2024 7.43  7.25 - 7.43 Final   pCO2, Ven 05/09/2024 32 (L)  44 - 60 mmHg Final   pO2, Ven 05/09/2024 50 (H)  32 - 45 mmHg Final   Bicarbonate 05/09/2024 21.2  20.0 - 28.0 mmol/L Final   Acid-base deficit 05/09/2024 2.3 (H)  0.0 - 2.0 mmol/L Final   O2 Saturation 05/09/2024 80.6  % Final   Patient temperature 05/09/2024 37.0   Final   Performed at [facility], [address], [place], [place] [address]   WBC 05/09/2024 7.9  4.0 - 10.5 K/uL Final   RBC 05/09/2024 4.75  4.22 - 5.81 MIL/uL Final   Hemoglobin 05/09/2024 13.5  13.0 - 17.0 g/dL Final   HCT 93/73/7974 40.5  39.0 - 52.0 % Final   MCV 05/09/2024 85.3  80.0 - 100.0 fL Final   MCH 05/09/2024 28.4  26.0 - 34.0 pg Final   MCHC 05/09/2024 33.3  30.0 - 36.0 g/dL Final   RDW 93/73/7974 14.2  11.5 - 15.5 % Final   Platelets 05/09/2024 260  150 - 400 K/uL Final   nRBC 05/09/2024 0.0  0.0 - 0.2 % Final   Performed at [facility], [address] [place], [place] [address]  Admission on 04/03/2024, Discharged on 04/12/2024  Component Date Value Ref Range Status   Sodium 04/04/2024 135  135 - 145 mmol/L Final   Potassium 04/04/2024 3.8  3.5 - 5.1 mmol/L Final   Chloride 04/04/2024 102  98 - 111 mmol/L Final   CO2 04/04/2024 22  22 - 32 mmol/L Final   Glucose, Bld 04/04/2024 109 (H)  70 - 99 mg/dL Final   Glucose reference range applies only to samples taken after fasting for at least 8 hours.   BUN 04/04/2024 6  6 - 20 mg/dL Final   Creatinine, Ser 04/04/2024 0.74  0.61 - 1.24 mg/dL Final   Calcium  04/04/2024 9.1  8.9 - 10.3 mg/dL Final   GFR, Estimated 04/04/2024 >60  >60 mL/min Final  Comment: (NOTE) Calculated using the CKD-EPI Creatinine Equation (2021)    Anion gap 04/04/2024 11  5 - 15 Final   Performed at [facility], [address], [place], [place] [address]   Cholesterol 04/04/2024 169  0 -  200 mg/dL Final   Triglycerides 94/77/7974 195 (H)  <150 mg/dL Final   HDL 94/77/7974 56  >40 mg/dL Final   Total CHOL/HDL Ratio 04/04/2024 3.0  RATIO Final   VLDL 04/04/2024 39  0 - 40 mg/dL Final   LDL Cholesterol 04/04/2024 74  0 - 99 mg/dL Final   Comment:        Total Cholesterol/HDL:CHD Risk Coronary Heart Disease Risk Table                     Men   Women  1/2 Average Risk   3.4   3.3  Average Risk       5.0   4.4  2 X Average Risk   9.6   7.1  3 X Average Risk  23.4   11.0        Use the calculated Patient Ratio above and the CHD Risk Table to determine the patient's CHD Risk.        ATP III CLASSIFICATION (LDL):  <100     mg/dL   Optimal  899-870  mg/dL   Near or Above                    Optimal  130-159  mg/dL   Borderline  839-810  mg/dL   High  >809     mg/dL   Very High Performed at [facility], [address], [place], [place] [address]   Admission on 04/02/2024, Discharged on 04/03/2024  Component Date Value Ref Range Status   Sodium 04/02/2024 137  135 - 145 mmol/L Final   Potassium 04/02/2024 3.3 (L)  3.5 - 5.1 mmol/L Final   Chloride 04/02/2024 102  98 - 111 mmol/L Final   CO2 04/02/2024 23  22 - 32 mmol/L Final   Glucose, Bld 04/02/2024 90  70 - 99 mg/dL Final   Glucose reference range applies only to samples taken after fasting for at least 8 hours.   BUN 04/02/2024 <5 (L)  6 - 20 mg/dL Final   Creatinine, Ser 04/02/2024 0.77  0.61 - 1.24 mg/dL Final   Calcium  04/02/2024 9.6  8.9 - 10.3 mg/dL Final   Total Protein 94/79/7974 8.1  6.5 - 8.1 g/dL Final   Albumin 94/79/7974 3.8  3.5 - 5.0 g/dL Final   AST 94/79/7974 38  15 - 41 U/L Final   ALT 04/02/2024 23  0 - 44 U/L Final   Alkaline Phosphatase 04/02/2024 116  38 - 126 U/L Final   Total Bilirubin 04/02/2024 0.8  0.0 - 1.2 mg/dL Final   GFR, Estimated 04/02/2024 >60  >60 mL/min Final   Comment: (NOTE) Calculated using the CKD-EPI Creatinine Equation (2021)    Anion gap 04/02/2024  12  5 - 15 Final   Performed at [facility], [address] [place], [place] [address]   Alcohol, Ethyl (B) 04/02/2024 257 (H)  <15 mg/dL Final   Comment: Please note change in reference range. (NOTE) For medical purposes only. Performed at [facility], [address] [place], [place] [address]    Opiates 04/02/2024 NONE DETECTED  NONE DETECTED Final   Cocaine 04/02/2024 NONE DETECTED  NONE DETECTED Final  Benzodiazepines 04/02/2024 POSITIVE (A)  NONE DETECTED Final   Amphetamines 04/02/2024 NONE DETECTED  NONE DETECTED Final   Tetrahydrocannabinol 04/02/2024 NONE DETECTED  NONE DETECTED Final   Barbiturates 04/02/2024 NONE DETECTED  NONE DETECTED Final   Comment: (NOTE) DRUG SCREEN FOR MEDICAL PURPOSES ONLY.  IF CONFIRMATION IS NEEDED FOR ANY PURPOSE, NOTIFY LAB WITHIN 5 DAYS.  LOWEST DETECTABLE LIMITS FOR URINE DRUG SCREEN Drug Class                     Cutoff (ng/mL) Amphetamine and metabolites    1000 Barbiturate and metabolites    200 Benzodiazepine                 200 Opiates and metabolites        300 Cocaine and metabolites        300 THC                            50 Performed at [facility], [address], [place], [place] [address]    WBC 04/02/2024 6.3  4.0 - 10.5 K/uL Final   RBC 04/02/2024 4.44  4.22 - 5.81 MIL/uL Final   Hemoglobin 04/02/2024 12.7 (L)  13.0 - 17.0 g/dL Final   HCT 94/79/7974 39.1  39.0 - 52.0 % Final   MCV 04/02/2024 88.1  80.0 - 100.0 fL Final   MCH 04/02/2024 28.6  26.0 - 34.0 pg Final   MCHC 04/02/2024 32.5  30.0 - 36.0 g/dL Final   RDW 94/79/7974 14.5  11.5 - 15.5 % Final   Platelets 04/02/2024 448 (H)  150 - 400 K/uL Final   nRBC 04/02/2024 0.0  0.0 - 0.2 % Final   Neutrophils Relative % 04/02/2024 63  % Final   Neutro Abs 04/02/2024 3.9  1.7 - 7.7 K/uL Final   Lymphocytes Relative 04/02/2024 25  % Final   Lymphs Abs 04/02/2024 1.6  0.7 - 4.0 K/uL Final   Monocytes  Relative 04/02/2024 10  % Final   Monocytes Absolute 04/02/2024 0.7  0.1 - 1.0 K/uL Final   Eosinophils Relative 04/02/2024 1  % Final   Eosinophils Absolute 04/02/2024 0.1  0.0 - 0.5 K/uL Final   Basophils Relative 04/02/2024 1  % Final   Basophils Absolute 04/02/2024 0.1  0.0 - 0.1 K/uL Final   Immature Granulocytes 04/02/2024 0  % Final   Abs Immature Granulocytes 04/02/2024 0.02  0.00 - 0.07 K/uL Final   Performed at [facility], [address], [place], [place] [address]  No results displayed because visit has over 200 results.    Admission on 01/25/2024, Discharged on 02/03/2024  Component Date Value Ref Range Status   Folate 01/29/2024 22.8  >5.9 ng/mL Final   Performed at [facility], [address] [place], [place] [address]   Hgb A1c MFr Bld 01/29/2024 4.5 (L)  4.8 - 5.6 % Final   Comment: (NOTE) Pre diabetes:          5.7%-6.4%  Diabetes:              >6.4%  Glycemic control for   <7.0% adults with diabetes    Mean Plasma Glucose 01/29/2024 82.45  mg/dL Final   Performed at [facility], [address], [place], [place] [address]   Cholesterol 01/29/2024 174  0 - 200 mg/dL Final   Triglycerides 96/82/7974 165 (H)  <150 mg/dL Final  HDL 01/29/2024 50  >40 mg/dL Final   Total CHOL/HDL Ratio 01/29/2024 3.5  RATIO Final   VLDL 01/29/2024 33  0 - 40 mg/dL Final   LDL Cholesterol 01/29/2024 91  0 - 99 mg/dL Final   Comment:        Total Cholesterol/HDL:CHD Risk Coronary Heart Disease Risk Table                     Men   Women  1/2 Average Risk   3.4   3.3  Average Risk       5.0   4.4  2 X Average Risk   9.6   7.1  3 X Average Risk  23.4   11.0        Use the calculated Patient Ratio above and the CHD Risk Table to determine the patient's CHD Risk.        ATP III CLASSIFICATION (LDL):  <100     mg/dL   Optimal  899-870  mg/dL   Near or Above                    Optimal  130-159  mg/dL   Borderline  839-810  mg/dL    High  >809     mg/dL   Very High Performed at [name], [address] [place], [place] [address]    RPR Ser Ql 01/29/2024 NON REACTIVE  NON REACTIVE Final   Performed at [facility], [address], [place], [place] [address]   TSH 01/29/2024 4.117  0.350 - 4.500 uIU/mL Final   Comment: Performed by a 3rd Generation assay with a functional sensitivity of <=0.01 uIU/mL. Performed at [facility], [address] [place], [place] [address]    Vitamin B-12 01/29/2024 585  180 - 914 pg/mL Final   Comment: (NOTE) This assay is not validated for testing neonatal or myeloproliferative syndrome specimens for Vitamin B12 levels. Performed at [facility], [address] [place], [place] [address]    Vit D, 25-Hydroxy 01/29/2024 29.42 (L)  30 - 100 ng/mL Final   Comment: (NOTE) Vitamin D  deficiency has been defined by the Institute of Medicine  and an Endocrine Society practice guideline as a level of serum 25-OH  vitamin D  less than 20 ng/mL (1,2). The Endocrine Society went on to  further define vitamin D  insufficiency as a level between 21 and 29  ng/mL (2).  1. IOM (Institute of Medicine). 2010. Dietary reference intakes for  calcium  and D. [place]: [employer]. 2. Holick MF, Binkley [place], Bischoff-Ferrari HA, et al. Evaluation,  treatment, and prevention of vitamin D  deficiency: an Endocrine  Society clinical practice guideline, JCEM. 2011 Jul; 96(7): 1911-30.  Performed at [facility], [address], [place], [place] [address]    Free T4 02/01/2024 0.74  0.61 - 1.12 ng/dL Final   Comment: (NOTE) Biotin ingestion may interfere with free T4 tests. If the results are inconsistent with the TSH level, previous test results, or the clinical presentation, then consider biotin interference. If needed, order repeat testing after stopping biotin. Performed at [facility], [address], [place], [place] [address]   Admission on 01/23/2024, Discharged on 01/25/2024  Component Date Value Ref Range Status   Glucose-Capillary 01/23/2024 111 (H)  70 - 99 mg/dL Final   Glucose reference range applies only to samples taken after fasting for at least  8 hours.   Sodium 01/23/2024 133 (L)  135 - 145 mmol/L Final   Potassium 01/23/2024 4.3  3.5 - 5.1 mmol/L Final   Chloride 01/23/2024 102  98 - 111 mmol/L Final   CO2 01/23/2024 23  22 - 32 mmol/L Final   Glucose, Bld 01/23/2024 100 (H)  70 - 99 mg/dL Final   Glucose reference range applies only to samples taken after fasting for at least 8 hours.   BUN 01/23/2024 8  6 - 20 mg/dL Final   Creatinine, Ser 01/23/2024 0.92  0.61 - 1.24 mg/dL Final   Calcium  01/23/2024 9.4  8.9 - 10.3 mg/dL Final   Total Protein 96/88/7974 7.4  6.5 - 8.1 g/dL Final   Albumin 96/88/7974 3.6  3.5 - 5.0 g/dL Final   AST 96/88/7974 125 (H)  15 - 41 U/L Final   ALT 01/23/2024 57 (H)  0 - 44 U/L Final   Alkaline Phosphatase 01/23/2024 85  38 - 126 U/L Final   Total Bilirubin 01/23/2024 0.7  0.0 - 1.2 mg/dL Final   GFR, Estimated 01/23/2024 >60  >60 mL/min Final   Comment: (NOTE) Calculated using the CKD-EPI Creatinine Equation (2021)    Anion gap 01/23/2024 8  5 - 15 Final   Performed at [facility], [address], [place], [place] [address]   WBC 01/23/2024 5.2  4.0 - 10.5 K/uL Final   RBC 01/23/2024 3.69 (L)  4.22 - 5.81 MIL/uL Final   Hemoglobin 01/23/2024 11.7 (L)  13.0 - 17.0 g/dL Final   HCT 96/88/7974 35.7 (L)  39.0 - 52.0 % Final   MCV 01/23/2024 96.7  80.0 - 100.0 fL Final   MCH 01/23/2024 31.7  26.0 - 34.0 pg Final   MCHC 01/23/2024 32.8  30.0 - 36.0 g/dL Final   RDW 96/88/7974 13.8  11.5 - 15.5 % Final   Platelets 01/23/2024 291  150 - 400 K/uL Final   nRBC 01/23/2024 0.0  0.0 - 0.2 % Final   Neutrophils Relative % 01/23/2024 65  % Final   Neutro Abs 01/23/2024 3.3  1.7 - 7.7 K/uL Final   Lymphocytes Relative 01/23/2024 16  %  Final   Lymphs Abs 01/23/2024 0.8  0.7 - 4.0 K/uL Final   Monocytes Relative 01/23/2024 15  % Final   Monocytes Absolute 01/23/2024 0.8  0.1 - 1.0 K/uL Final   Eosinophils Relative 01/23/2024 3  % Final   Eosinophils Absolute 01/23/2024 0.2  0.0 - 0.5 K/uL Final   Basophils Relative 01/23/2024 1  % Final   Basophils Absolute 01/23/2024 0.1  0.0 - 0.1 K/uL Final   Immature Granulocytes 01/23/2024 0  % Final   Abs Immature Granulocytes 01/23/2024 0.02  0.00 - 0.07 K/uL Final   Performed at [facility], [address] [place], [place] [address]   Magnesium  01/23/2024 2.1  1.7 - 2.4 mg/dL Final   Performed at [facility], [address] [place], [place] [address]   Alcohol, Ethyl (B) 01/23/2024 <10  <10 mg/dL Final   Comment: (NOTE) Lowest detectable limit for serum alcohol is 10 mg/dL.  For medical purposes only. Performed at [facility], [address] [place], [place] [address]    Opiates 01/23/2024 NONE DETECTED  NONE DETECTED Final   Cocaine 01/23/2024 NONE DETECTED  NONE DETECTED Final   Benzodiazepines 01/23/2024 POSITIVE (A)  NONE DETECTED Final   Amphetamines 01/23/2024 NONE DETECTED  NONE DETECTED Final   Tetrahydrocannabinol 01/23/2024  NONE DETECTED  NONE DETECTED Final   Barbiturates 01/23/2024 NONE DETECTED  NONE DETECTED Final   Comment: (NOTE) DRUG SCREEN FOR MEDICAL PURPOSES ONLY.  IF CONFIRMATION IS NEEDED FOR ANY PURPOSE, NOTIFY LAB WITHIN 5 DAYS.  LOWEST DETECTABLE LIMITS FOR URINE DRUG SCREEN Drug Class                     Cutoff (ng/mL) Amphetamine and metabolites    1000 Barbiturate and metabolites    200 Benzodiazepine                 200 Opiates and metabolites        300 Cocaine and metabolites        300 THC                            50 Performed at [facility], [address] [place], [place] [address]    Color, Urine 01/23/2024 YELLOW  YELLOW Final   APPearance 01/23/2024 CLEAR  CLEAR Final   Specific Gravity,  Urine 01/23/2024 1.011  1.005 - 1.030 Final   pH 01/23/2024 6.0  5.0 - 8.0 Final   Glucose, UA 01/23/2024 NEGATIVE  NEGATIVE mg/dL Final   Hgb urine dipstick 01/23/2024 NEGATIVE  NEGATIVE Final   Bilirubin Urine 01/23/2024 NEGATIVE  NEGATIVE Final   Ketones, ur 01/23/2024 NEGATIVE  NEGATIVE mg/dL Final   Protein, ur 96/88/7974 NEGATIVE  NEGATIVE mg/dL Final   Nitrite 96/88/7974 NEGATIVE  NEGATIVE Final   Leukocytes,Ua 01/23/2024 NEGATIVE  NEGATIVE Final   Performed at [facility], [address] [place], [place] [address]   Glucose-Capillary 01/23/2024 87  70 - 99 mg/dL Final   Glucose reference range applies only to samples taken after fasting for at least 8 hours.   Sodium 01/23/2024 133 (L)  135 - 145 mmol/L Final   Potassium 01/23/2024 4.3  3.5 - 5.1 mmol/L Final   Chloride 01/23/2024 101  98 - 111 mmol/L Final   BUN 01/23/2024 9  6 - 20 mg/dL Final   Creatinine, Ser 01/23/2024 0.90  0.61 - 1.24 mg/dL Final   Glucose, Bld 96/88/7974 93  70 - 99 mg/dL Final   Glucose reference range applies only to samples taken after fasting for at least 8 hours.   Calcium , Ion 01/23/2024 1.05 (L)  1.15 - 1.40 mmol/L Final   TCO2 01/23/2024 25  22 - 32 mmol/L Final   Hemoglobin 01/23/2024 12.2 (L)  13.0 - 17.0 g/dL Final   HCT 96/88/7974 36.0 (L)  39.0 - 52.0 % Final   HIV Screen 4th Generation wRfx 01/24/2024 Non Reactive  Non Reactive Final   Performed at [facility], [address], [place], [place] [address]   WBC 01/24/2024 4.5  4.0 - 10.5 K/uL Final   RBC 01/24/2024 3.75 (L)  4.22 - 5.81 MIL/uL Final   Hemoglobin 01/24/2024 11.8 (L)  13.0 - 17.0 g/dL Final   HCT 96/87/7974 35.2 (L)  39.0 - 52.0 % Final   MCV 01/24/2024 93.9  80.0 - 100.0 fL Final   MCH 01/24/2024 31.5  26.0 - 34.0 pg Final   MCHC 01/24/2024 33.5  30.0 - 36.0 g/dL Final   RDW 96/87/7974 13.8  11.5 - 15.5 % Final   Platelets 01/24/2024 301  150 - 400 K/uL Final   nRBC 01/24/2024 0.0  0.0 - 0.2 % Final    Performed at [facility], [address] [place], [place] [address]   Sodium 01/24/2024 135  135 - 145 mmol/L Final   Potassium 01/24/2024 3.9  3.5 - 5.1 mmol/L Final   Chloride 01/24/2024 104  98 - 111 mmol/L Final   CO2 01/24/2024 22  22 - 32 mmol/L Final   Glucose, Bld 01/24/2024 96  70 - 99 mg/dL Final   Glucose reference range applies only to samples taken after fasting for at least 8 hours.   BUN 01/24/2024 8  6 - 20 mg/dL Final   Creatinine, Ser 01/24/2024 0.96  0.61 - 1.24 mg/dL Final   Calcium  01/24/2024 9.5  8.9 - 10.3 mg/dL Final   Total Protein 96/87/7974 7.5  6.5 - 8.1 g/dL Final   Albumin 96/87/7974 3.6  3.5 - 5.0 g/dL Final   AST 96/87/7974 86 (H)  15 - 41 U/L Final   ALT 01/24/2024 51 (H)  0 - 44 U/L Final   Alkaline Phosphatase 01/24/2024 81  38 - 126 U/L Final   Total Bilirubin 01/24/2024 0.7  0.0 - 1.2 mg/dL Final   GFR, Estimated 01/24/2024 >60  >60 mL/min Final   Comment: (NOTE) Calculated using the CKD-EPI Creatinine Equation (2021)    Anion gap 01/24/2024 9  5 - 15 Final   Performed at [facility], [address], [place], [place] [address]   WBC 01/25/2024 4.6  4.0 - 10.5 K/uL Final   RBC 01/25/2024 3.72 (L)  4.22 - 5.81 MIL/uL Final   Hemoglobin 01/25/2024 11.7 (L)  13.0 - 17.0 g/dL Final   HCT 96/86/7974 35.1 (L)  39.0 - 52.0 % Final   MCV 01/25/2024 94.4  80.0 - 100.0 fL Final   MCH 01/25/2024 31.5  26.0 - 34.0 pg Final   MCHC 01/25/2024 33.3  30.0 - 36.0 g/dL Final   RDW 96/86/7974 13.8  11.5 - 15.5 % Final   Platelets 01/25/2024 292  150 - 400 K/uL Final   nRBC 01/25/2024 0.0  0.0 - 0.2 % Final   Performed at [facility], [address], [place], [place] [address]   Sodium 01/25/2024 138  135 - 145 mmol/L Final   Potassium 01/25/2024 4.3  3.5 - 5.1 mmol/L Final   Chloride 01/25/2024 104  98 - 111 mmol/L Final   CO2 01/25/2024 24  22 - 32 mmol/L Final   Glucose, Bld 01/25/2024 93  70 - 99 mg/dL Final   Glucose reference range  applies only to samples taken after fasting for at least 8 hours.   BUN 01/25/2024 8  6 - 20 mg/dL Final   Creatinine, Ser 01/25/2024 0.79  0.61 - 1.24 mg/dL Final   Calcium  01/25/2024 9.2  8.9 - 10.3 mg/dL Final   GFR, Estimated 01/25/2024 >60  >60 mL/min Final   Comment: (NOTE) Calculated using the CKD-EPI Creatinine Equation (2021)    Anion gap 01/25/2024 10  5 - 15 Final   Performed at [facility], [address], [place], [place] [address]  Admission on 01/18/2024, Discharged on 01/23/2024  Component Date Value Ref Range Status   POC Amphetamine UR 01/18/2024 None Detected  NONE DETECTED (Cut Off Level 1000 ng/mL) Final   POC Secobarbital (BAR) 01/18/2024 None Detected  NONE DETECTED (Cut Off Level 300 ng/mL) Final   POC Buprenorphine (BUP) 01/18/2024 None Detected  NONE DETECTED (Cut Off Level 10 ng/mL) Final   POC Oxazepam (BZO) 01/18/2024 Positive (A)  NONE DETECTED (Cut Off Level 300 ng/mL) Final  POC Cocaine UR 01/18/2024 None Detected  NONE DETECTED (Cut Off Level 300 ng/mL) Final   POC Methamphetamine UR 01/18/2024 None Detected  NONE DETECTED (Cut Off Level 1000 ng/mL) Final   POC Morphine  01/18/2024 None Detected  NONE DETECTED (Cut Off Level 300 ng/mL) Final   POC Methadone UR 01/18/2024 None Detected  NONE DETECTED (Cut Off Level 300 ng/mL) Final   POC Oxycodone  UR 01/18/2024 None Detected  NONE DETECTED (Cut Off Level 100 ng/mL) Final   POC Marijuana UR 01/18/2024 Positive (A)  NONE DETECTED (Cut Off Level 50 ng/mL) Final   Alcohol, Ethyl (B) 01/18/2024 <10  <10 mg/dL Final   Comment: (NOTE) Lowest detectable limit for serum alcohol is 10 mg/dL.  For medical purposes only. Performed at [facility], [address], [place], [place] [address]    Sodium 01/18/2024 138  135 - 145 mmol/L Final   Potassium 01/18/2024 3.7  3.5 - 5.1 mmol/L Final   Chloride 01/18/2024 95 (L)  98 - 111 mmol/L Final   CO2 01/18/2024 26  22 - 32 mmol/L Final   Glucose, Bld  01/18/2024 94  70 - 99 mg/dL Final   Glucose reference range applies only to samples taken after fasting for at least 8 hours.   BUN 01/18/2024 <5 (L)  6 - 20 mg/dL Final   Creatinine, Ser 01/18/2024 0.76  0.61 - 1.24 mg/dL Final   Calcium  01/18/2024 9.2  8.9 - 10.3 mg/dL Final   Total Protein 96/93/7974 7.9  6.5 - 8.1 g/dL Final   Albumin 96/93/7974 3.7  3.5 - 5.0 g/dL Final   AST 96/93/7974 68 (H)  15 - 41 U/L Final   ALT 01/18/2024 23  0 - 44 U/L Final   Alkaline Phosphatase 01/18/2024 111  38 - 126 U/L Final   Total Bilirubin 01/18/2024 1.4 (H)  0.0 - 1.2 mg/dL Final   GFR, Estimated 01/18/2024 >60  >60 mL/min Final   Comment: (NOTE) Calculated using the CKD-EPI Creatinine Equation (2021)    Anion gap 01/18/2024 17 (H)  5 - 15 Final   Performed at [facility], [address] [place], [place] [address]   Sodium 01/21/2024 134 (L)  135 - 145 mmol/L Final   Potassium 01/21/2024 4.1  3.5 - 5.1 mmol/L Final   Chloride 01/21/2024 99  98 - 111 mmol/L Final   CO2 01/21/2024 23  22 - 32 mmol/L Final   Glucose, Bld 01/21/2024 80  70 - 99 mg/dL Final   Glucose reference range applies only to samples taken after fasting for at least 8 hours.   BUN 01/21/2024 8  6 - 20 mg/dL Final   Creatinine, Ser 01/21/2024 0.69  0.61 - 1.24 mg/dL Final   Calcium  01/21/2024 9.9  8.9 - 10.3 mg/dL Final   Total Protein 96/90/7974 7.8  6.5 - 8.1 g/dL Final   Albumin 96/90/7974 3.8  3.5 - 5.0 g/dL Final   AST 96/90/7974 78 (H)  15 - 41 U/L Final   ALT 01/21/2024 33  0 - 44 U/L Final   Alkaline Phosphatase 01/21/2024 90  38 - 126 U/L Final   Total Bilirubin 01/21/2024 0.7  0.0 - 1.2 mg/dL Final   GFR, Estimated 01/21/2024 >60  >60 mL/min Final   Comment: (NOTE) Calculated using the CKD-EPI Creatinine Equation (2021)    Anion gap 01/21/2024 12  5 - 15 Final   Performed at [facility], [address], [place], [place] [address]  Prothrombin Time 01/21/2024 13.0  11.4 - 15.2 seconds Final    INR 01/21/2024 1.0  0.8 - 1.2 Final   Comment: (NOTE) INR goal varies based on device and disease states. Performed at [facility], [address], [place], [place] [address]    WBC 01/21/2024 5.5  4.0 - 10.5 K/uL Final   RBC 01/21/2024 3.75 (L)  4.22 - 5.81 MIL/uL Final   Hemoglobin 01/21/2024 11.9 (L)  13.0 - 17.0 g/dL Final   HCT 96/90/7974 36.1 (L)  39.0 - 52.0 % Final   MCV 01/21/2024 96.3  80.0 - 100.0 fL Final   MCH 01/21/2024 31.7  26.0 - 34.0 pg Final   MCHC 01/21/2024 33.0  30.0 - 36.0 g/dL Final   RDW 96/90/7974 14.3  11.5 - 15.5 % Final   Platelets 01/21/2024 261  150 - 400 K/uL Final   nRBC 01/21/2024 0.0  0.0 - 0.2 % Final   Performed at [facility], [address], [place], [place] [address]   Magnesium  01/21/2024 1.9  1.7 - 2.4 mg/dL Final   Performed at [facility], [address], [place], [place] [address]  Admission on 01/16/2024, Discharged on 01/17/2024  Component Date Value Ref Range Status   Sodium 01/17/2024 138  135 - 145 mmol/L Final   Potassium 01/17/2024 3.0 (L)  3.5 - 5.1 mmol/L Final   Chloride 01/17/2024 101  98 - 111 mmol/L Final   CO2 01/17/2024 23  22 - 32 mmol/L Final   Glucose, Bld 01/17/2024 102 (H)  70 - 99 mg/dL Final   Glucose reference range applies only to samples taken after fasting for at least 8 hours.   BUN 01/17/2024 5 (L)  6 - 20 mg/dL Final   Creatinine, Ser 01/17/2024 0.57 (L)  0.61 - 1.24 mg/dL Final   Calcium  01/17/2024 8.4 (L)  8.9 - 10.3 mg/dL Final   Total Protein 96/94/7974 7.8  6.5 - 8.1 g/dL Final   Albumin 96/94/7974 3.8  3.5 - 5.0 g/dL Final   AST 96/94/7974 129 (H)  15 - 41 U/L Final   ALT 01/17/2024 27  0 - 44 U/L Final   Alkaline Phosphatase 01/17/2024 108  38 - 126 U/L Final   Total Bilirubin 01/17/2024 0.6  0.0 - 1.2 mg/dL Final   GFR, Estimated 01/17/2024 >60  >60 mL/min Final   Comment: (NOTE) Calculated using the CKD-EPI Creatinine Equation (2021)    Anion gap 01/17/2024 14  5 - 15 Final    Performed at [facility], [address] [place], [place] [address]   WBC 01/17/2024 3.6 (L)  4.0 - 10.5 K/uL Final   RBC 01/17/2024 3.54 (L)  4.22 - 5.81 MIL/uL Final   Hemoglobin 01/17/2024 10.9 (L)  13.0 - 17.0 g/dL Final   HCT 96/94/7974 33.8 (L)  39.0 - 52.0 % Final   MCV 01/17/2024 95.5  80.0 - 100.0 fL Final   MCH 01/17/2024 30.8  26.0 - 34.0 pg Final   MCHC 01/17/2024 32.2  30.0 - 36.0 g/dL Final   RDW 96/94/7974 14.8  11.5 - 15.5 % Final   Platelets 01/17/2024 218  150 - 400 K/uL Final   nRBC 01/17/2024 0.0  0.0 - 0.2 % Final   Neutrophils Relative % 01/17/2024 58  % Final   Neutro Abs 01/17/2024 2.1  1.7 - 7.7 K/uL Final   Lymphocytes Relative 01/17/2024 29  % Final   Lymphs Abs 01/17/2024 1.0  0.7 -  4.0 K/uL Final   Monocytes Relative 01/17/2024 10  % Final   Monocytes Absolute 01/17/2024 0.4  0.1 - 1.0 K/uL Final   Eosinophils Relative 01/17/2024 2  % Final   Eosinophils Absolute 01/17/2024 0.1  0.0 - 0.5 K/uL Final   Basophils Relative 01/17/2024 1  % Final   Basophils Absolute 01/17/2024 0.1  0.0 - 0.1 K/uL Final   Immature Granulocytes 01/17/2024 0  % Final   Abs Immature Granulocytes 01/17/2024 0.01  0.00 - 0.07 K/uL Final   Performed at [facility], [address], [place], [place] [address]   Alcohol, Ethyl (B) 01/17/2024 499 (HH)  <10 mg/dL Final   Comment: CRITICAL RESULT CALLED TO, READ BACK BY AND VERIFIED WITH MAYWEATHER, S. RN AT 0132 ON 3.5.25. FA (NOTE) Lowest detectable limit for serum alcohol is 10 mg/dL.  For medical purposes only. Performed at [facility], [address], [place], [place] [address]   There may be more visits with results that are not included.    Blood Alcohol level:  Lab Results  Component Value Date   [facility] <15 05/27/2024   ETH <15 05/20/2024    Metabolic Disorder Labs: Lab Results  Component Value Date   HGBA1C 4.4 (L) 05/20/2024   MPG 79.58 05/20/2024   MPG  82.45 01/29/2024   No results found for: PROLACTIN Lab Results  Component Value Date   CHOL 208 (H) 05/20/2024   TRIG 168 (H) 05/20/2024   HDL 79 05/20/2024   CHOLHDL 2.6 05/20/2024   VLDL 34 05/20/2024   LDLCALC 95 05/20/2024   LDLCALC 74 04/04/2024    Therapeutic Lab Levels: No results found for: LITHIUM No results found for: VALPROATE No results found for: CBMZ  Physical Findings   AIMS    Flowsheet Row [facility]  AIMS Total Score 0   AUDIT    Flowsheet Row Office Visit from 04/22/2024 in [facility] Admission (Discharged) [facility] Admission (Discharged) from [facility] Admission (Discharged) [facility]  Alcohol Use Disorder Identification Test Final Score (AUDIT) 6 10 0 29   CAGE-AID    Flowsheet Row ED to Hosp-Admission (Discharged) from 06/22/2021 in [facility]  CAGE-AID Score 3   GAD-7    Flowsheet Row Office Visit from 04/22/2024 in [facility]  Total GAD-7 Score 0   Mini-Mental    Flowsheet Row Office Visit from 05/02/2024 in [facility]  Total Score (max 30 points ) 21   PHQ2-9    Flowsheet Row ED from 05/27/2024 in [facility] Office Visit from 04/22/2024 in [facility] Office Visit from 02/07/2024 in [facility] [place] ED from 01/18/2024 in [facility] Office Visit from 06/10/2022 in [facility] [place]  PHQ-2 Total Score 6 0 0 0 0  PHQ-9 Total Score 12 0 -- 3 --   Flowsheet Row ED from 05/27/2024 in [facility] Most recent reading at 05/27/2024 11:10 PM ED from 05/27/2024 in [facility] Most recent reading at 05/27/2024  6:22 PM ED to Hosp-Admission (Discharged) from 05/20/2024  in [facility] Most recent reading at 05/20/2024 10:08 AM  C-SSRS RISK CATEGORY No Risk No Risk No Risk     Musculoskeletal  Strength & Muscle Tone: within normal limits Gait & Station: normal Patient leans: N/A  Psychiatric Specialty Exam  Presentation  General Appearance:  Fairly Groomed  Eye Contact: Fair  Speech: Clear and Coherent  Speech Volume: Normal  Handedness: Right   Mood and Affect  Mood: Depressed; Anxious  Affect: Congruent   Thought Process  Thought Processes: Coherent  Descriptions of Associations:Intact  Orientation:Full (Time, Place and Person)  Thought Content:Logical  Diagnosis of Schizophrenia or Schizoaffective disorder in past: No    Hallucinations:Hallucinations: None  Ideas of Reference:None  Suicidal Thoughts:Suicidal Thoughts: No  Homicidal Thoughts:Homicidal Thoughts: No   Sensorium  Memory: Immediate Fair  Judgment: Fair  Insight: Fair   Art therapist  Concentration: Fair  Attention Span: Fair  Recall: Fair  Fund of Knowledge: Fair  Language: Fair   Psychomotor Activity  Psychomotor Activity:Psychomotor Activity: Normal   Assets  Assets: Resilience; Communication Skills; Social Support   Sleep  Sleep:Sleep: Good  Estimated Sleeping Duration (Last 24 Hours): 10.25-11.00 hours  No data recorded  Physical Exam  Physical Exam Constitutional:      Appearance: Normal appearance.  Musculoskeletal:        General: Normal range of motion.     Cervical back: Normal range of motion.  Neurological:     General: No focal deficit present.     Mental Status: He is alert and oriented to person, place, and time.    Review of Systems  Psychiatric/Behavioral:  Positive  for depression and substance abuse. Negative for hallucinations, memory loss and suicidal ideas. The patient is nervous/anxious and has insomnia.   All other systems reviewed and are negative.  Blood pressure 100/66, pulse 95, temperature 99 F (37.2 C), resp. rate 17, SpO2 98%. There is no height or weight on file to calculate BMI.  Treatment Plan Summary: Daily contact with patient to assess and evaluate symptoms and progress in treatment and Medication management   Safety and Monitoring: Voluntary admission to inpatient psychiatric unit for safety, stabilization and treatment Daily contact with patient to assess and evaluate symptoms and progress in treatment Patient's case to be discussed in multi-disciplinary team meeting Observation Level : q15 minute checks Vital signs: q12 hours Precautions: Safety   Long Term Goal(s): Improvement in symptoms so as ready for discharge   Short Term Goals: Ability to identify changes in lifestyle to reduce recurrence of condition will improve, Ability to verbalize feelings will improve, Ability to disclose and discuss suicidal ideas, Ability to demonstrate self-control will improve, Ability to identify and develop effective coping behaviors will improve, Ability to maintain clinical measurements within normal limits will improve, Compliance with prescribed medications will improve, and Ability to identify triggers associated with substance abuse/mental health issues will improve   Diagnoses Principal Problem:   Alcohol use disorder Active Problems:   History of seizure due to alcohol withdrawal   Gastroesophageal reflux disease without esophagitis   Hyperlipidemia   Medications: -Last dose of Ativan  taper was on 05/31/2024-As per attending Psychiatrist's note from yesterday, Given patient's complicated withdrawal history we will continue continue to monitor closely (including CIWA) for 48-72hrs. -Continue Cymbalta  60 mg daily for depressive  symptoms and GAD -Continue Seroquel  100 mg at nightly for mood stabilization -Continue gabapentin  600 mg 3 times daily for GAD and alcohol use. - Continue Lipitor 40 mg nightly  for hypercholesterolemia - Continue trazodone  100 mg nightly for sleep -Given one-time dose Mg Citrate for constipation on 7/19. -Continue Mg Oxide 500 mg BID for brain & bone health (Substituted since [place] does not carry Magnesium  Glycinate).   PRNS -Start Hydroxyzine  25 mg TID PRN for anxiety -Continue Tylenol  650 mg every 6 hours PRN for mild pain -Continue Maalox 30 mg every 4 hrs PRN for indigestion -Continue Milk of Magnesia as needed every 6 hrs for constipation -Continue Agitation protocol meds as per the MAR-See MAR for details   Labs: Discontinued CBGs-no current indication   Discharge Planning: Social work and case management to assist with discharge planning and identification of hospital follow-up needs prior to discharge Estimated LOS: 3-4 days Discharge Concerns: Need to establish a safety plan; Medication compliance and effectiveness Discharge Goals: Return home with outpatient referrals for mental health follow-up including medication management/psychotherapy   I certify that inpatient services furnished can reasonably be expected to improve the p  Donia Snell, NP 06/02/2024 5:01 PM

## 2024-06-02 NOTE — ED Notes (Signed)
 Awake and alert sitting in dayroom watching tv with peers.  No seizure activity noted.

## 2024-06-02 NOTE — Group Note (Signed)
 Group Topic: Recovery Basics  Group Date: 06/02/2024 Start Time: 1205 End Time: 1240 Facilitators: Stanly Stabile, RN  Department: [facility]  Number of Participants: 7  Group Focus: chemical dependency education, chemical dependency issues, clarity of thought, communication, and coping skills Treatment Modality:  Behavior Modification Therapy Interventions utilized were clarification, exploration, patient education, and problem solving Purpose: enhance coping skills, explore maladaptive thinking, express feelings, express irrational fears, improve communication skills, increase insight, regain self-worth, reinforce self-care, and relapse prevention strategies  Name: Xavier Owens Date of Birth: 31-Jan-1971  MR: [id number]    Level of Participation: minimal Quality of Participation: attentive Interactions with others: gave feedback Mood/Affect: appropriate Triggers (if applicable):   Cognition: coherent/clear Progress: Minimal Response:   Plan: follow-up needed  Patients Problems:  Patient Active Problem List   Diagnosis Date Noted   History of seizure due to alcohol withdrawal 05/30/2024   Gastroesophageal reflux disease without esophagitis 05/30/2024   Hyperlipidemia 05/30/2024   Schatzki's ring 05/23/2024   Encounter to discuss test results 05/22/2024   Non-cardiac chest pain 05/22/2024   Esophageal dysphagia 05/22/2024   Abnormal loss of weight 05/22/2024   Abnormal finding on GI tract imaging 05/22/2024   Chest pain 05/21/2024   Elevated troponin 05/21/2024   Hypotension 05/21/2024   Chest discomfort 05/20/2024   Electrolyte abnormality 05/20/2024   Alcohol withdrawal (HCC) 05/09/2024   AKI (acute kidney injury) (HCC) 05/09/2024   Leukocytosis 05/09/2024   Heat exhaustion, water deprivation 05/09/2024   Major depression, recurrent (HCC) 04/03/2024   Reactive depression 03/22/2024   Alcohol withdrawal syndrome with complication  (HCC) 03/22/2024   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   Dehydration 03/21/2024   Acute lactic acidosis 03/21/2024   Delirium tremens (HCC) 03/18/2024   Nausea & vomiting 03/16/2024   Alcohol use disorder 03/16/2024   Tremor 03/16/2024   Neuropathy 03/16/2024   Mood disorder (HCC) 03/16/2024   Memory impairment 02/08/2024   Right knee pain 02/08/2024   Major depressive disorder, recurrent severe without psychotic features (HCC) 01/25/2024   Alcohol withdrawal seizure (HCC) 01/23/2024   Alcohol use disorder, severe, dependence (HCC) 01/18/2024   Healthcare maintenance 06/10/2022   Nightmares 12/21/2021   Subclinical hypothyroidism 12/21/2021   Substance induced mood disorder (HCC) 12/14/2021   Alcohol use disorder, moderate, dependence (HCC) 06/23/2021   Seizures (HCC) 06/22/2021   Elevated liver enzymes 04/19/2016   Essential hypertension 04/19/2016

## 2024-06-02 NOTE — Group Note (Signed)
 Group Topic: Understanding Self  Group Date: 06/02/2024 Start Time: 8069 End Time: 2000 Facilitators: Anice Benton LABOR, NT  Department: [facility]  Number of Participants: 4  Group Focus: self-awareness Treatment Modality:  Cognitive Behavioral Therapy Interventions utilized were mental fitness Purpose: explore maladaptive thinking  Name: Xavier Owens Date of Birth: Mar 07, 1971  MR: [id number]    Level of Participation: active Quality of Participation: cooperative Interactions with others: gave feedback Mood/Affect: appropriate Triggers (if applicable): N/A Cognition: coherent/clear Progress: Moderate Response: Good Plan: follow-up needed  Patients Problems:  Patient Active Problem List   Diagnosis Date Noted   History of seizure due to alcohol withdrawal 05/30/2024   Gastroesophageal reflux disease without esophagitis 05/30/2024   Hyperlipidemia 05/30/2024   Schatzki's ring 05/23/2024   Encounter to discuss test results 05/22/2024   Non-cardiac chest pain 05/22/2024   Esophageal dysphagia 05/22/2024   Abnormal loss of weight 05/22/2024   Abnormal finding on GI tract imaging 05/22/2024   Chest pain 05/21/2024   Elevated troponin 05/21/2024   Hypotension 05/21/2024   Chest discomfort 05/20/2024   Electrolyte abnormality 05/20/2024   Alcohol withdrawal (HCC) 05/09/2024   AKI (acute kidney injury) (HCC) 05/09/2024   Leukocytosis 05/09/2024   Heat exhaustion, water deprivation 05/09/2024   Major depression, recurrent (HCC) 04/03/2024   Reactive depression 03/22/2024   Alcohol withdrawal syndrome with complication (HCC) 03/22/2024   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   Dehydration 03/21/2024   Acute lactic acidosis 03/21/2024   Delirium tremens (HCC) 03/18/2024   Nausea & vomiting 03/16/2024   Alcohol use disorder 03/16/2024   Tremor 03/16/2024   Neuropathy 03/16/2024   Mood disorder (HCC) 03/16/2024   Memory impairment 02/08/2024    Right knee pain 02/08/2024   Major depressive disorder, recurrent severe without psychotic features (HCC) 01/25/2024   Alcohol withdrawal seizure (HCC) 01/23/2024   Alcohol use disorder, severe, dependence (HCC) 01/18/2024   Healthcare maintenance 06/10/2022   Nightmares 12/21/2021   Subclinical hypothyroidism 12/21/2021   Substance induced mood disorder (HCC) 12/14/2021   Alcohol use disorder, moderate, dependence (HCC) 06/23/2021   Seizures (HCC) 06/22/2021   Elevated liver enzymes 04/19/2016   Essential hypertension 04/19/2016

## 2024-06-03 DIAGNOSIS — K219 Gastro-esophageal reflux disease without esophagitis: Secondary | ICD-10-CM | POA: Diagnosis not present

## 2024-06-03 DIAGNOSIS — F331 Major depressive disorder, recurrent, moderate: Secondary | ICD-10-CM | POA: Diagnosis not present

## 2024-06-03 DIAGNOSIS — E785 Hyperlipidemia, unspecified: Secondary | ICD-10-CM | POA: Diagnosis not present

## 2024-06-03 DIAGNOSIS — F1010 Alcohol abuse, uncomplicated: Secondary | ICD-10-CM | POA: Diagnosis not present

## 2024-06-03 MED ORDER — DULOXETINE HCL 60 MG PO CPEP: 60.0000 mg | ORAL_CAPSULE | Freq: Every day | ORAL | 0 refills | Status: DC

## 2024-06-03 MED ORDER — TRAZODONE HCL 100 MG PO TABS: 100.0000 mg | ORAL_TABLET | Freq: Every day | ORAL | 0 refills | Status: DC

## 2024-06-03 MED ORDER — PANTOPRAZOLE SODIUM 40 MG PO TBEC: 40.0000 mg | DELAYED_RELEASE_TABLET | Freq: Every day | ORAL | 0 refills | Status: DC

## 2024-06-03 MED ORDER — HYDROXYZINE HCL 25 MG PO TABS: 25.0000 mg | ORAL_TABLET | Freq: Three times a day (TID) | ORAL | 0 refills | Status: DC | PRN

## 2024-06-03 MED ORDER — MELATONIN 3 MG PO TABS: 3.0000 mg | ORAL_TABLET | Freq: Every day | ORAL | Status: DC

## 2024-06-03 MED ORDER — QUETIAPINE FUMARATE 100 MG PO TABS: 100.0000 mg | ORAL_TABLET | Freq: Every day | ORAL | 0 refills | Status: DC

## 2024-06-03 MED ORDER — NALTREXONE HCL 50 MG PO TABS: 50.0000 mg | ORAL_TABLET | Freq: Every day | ORAL | 0 refills | Status: DC

## 2024-06-03 MED ORDER — MAGNESIUM OXIDE -MG SUPPLEMENT 400 (240 MG) MG PO TABS: 400.0000 mg | ORAL_TABLET | Freq: Two times a day (BID) | ORAL | 0 refills | Status: DC

## 2024-06-03 MED ORDER — ATORVASTATIN CALCIUM 40 MG PO TABS: 40.0000 mg | ORAL_TABLET | Freq: Every day | ORAL | 0 refills | Status: DC

## 2024-06-03 MED ORDER — GABAPENTIN 300 MG PO CAPS: 600.0000 mg | ORAL_CAPSULE | Freq: Three times a day (TID) | ORAL | 0 refills | Status: DC

## 2024-06-03 NOTE — ED Notes (Signed)
 Patient is sleeping at this time no observation strange thing

## 2024-06-03 NOTE — ED Notes (Signed)
 Patient awake and alert this morning sitting in dayroom with peers eating breakfast.  Patient is calm and pleasant.  He reports not sleepong well last night.  Doris NP made aware.  No signs of seizure activity.  No withdrawal.  Will monitor.

## 2024-06-03 NOTE — Discharge Planning (Addendum)
 Patient is scheduled to DC home today with wife. SW arranged a follow up appointment with is PCP on Monday July 28 @ 3:30 for follow up. Patient was provided information on CDIOP and given information on walk in for outpatient in his AVS.  Per provider, patient will DC tomorrow.

## 2024-06-03 NOTE — ED Provider Notes (Signed)
 FBC/OBS ASAP Discharge Summary  Date and Time: 06/03/2024 11:58 AM  Name: Xavier Owens  MRN:  [id number]   Discharge Diagnoses:  Final diagnoses:  Hyperlipidemia, unspecified hyperlipidemia type  Gastroesophageal reflux disease without esophagitis  Alcohol use disorder  Moderate episode of recurrent major depressive disorder (HCC)  History of seizure due to alcohol withdrawal   HPI: Xavier Owens is a 53 y.o. male who [facility] [place] [facility] on [facility]/24 with worsening depressive symptoms & SI as well as alcohol withdrawals symptoms. Per triage:  Pt presents to [facility] unaccompanied. Pt states he is having withdrawls from alcohol starting this morning. Pt reports that he drank yesterday and all weekend. Pt mentions he had a fifth of alcohol yesterday. Pt mentions he is drinking every weekend. Patient has had > a dozen previous visits to the ER for alcohol intoxication related reasons as per chart review. Patient was also hospitalized at the [facility] on 01/25/2024. He has also had complications related to alcohol use in the past including withdrawal seizures.    Stay Summary: Patient was admitted to the Facility based crises center of the [facility], and placed on an Ativan  taper for detox from alcohol. Patient successfully completed detox without any complications. Patient was kept hospitalized for >48 hrs to ensure that the risk of alcohol withdrawal seizures was eradicated, prior to discharge.  -Last dose of Ativan  taper was on 05/31/2024. Medications at discharge are his home meds as follows: -Continue Cymbalta  60 mg daily for depressive symptoms and GAD -Continue Seroquel  100 mg at nightly for mood stabilization -Continue gabapentin  600 mg 3 times daily for GAD and alcohol use. - Continue Lipitor 40 mg nightly for hypercholesterolemia - Continue trazodone  100 mg nightly for sleep -Given one-time dose Mg Citrate for  constipation on 7/19. -Continue Mg Oxide 500 mg BID for brain & bone health (Substituted since [place] does not carry Magnesium  Glycinate).  Over the course of this hospitalization, We discussed changes to current medication regimen, since npt reported persistent depressive symptoms. However, patient is persistent that he would like to continue Seroquel  and Trazodone , does not want Cymbalta  discontinued while here either, also wanting the dose to stay the same at 60 mg. Declines, when offered a dose of 90 mg due to his continuous reports of depression. We will defer further medication management to outpatient psychiatric team.  Patient has been very focused on having hydroxyzine  during his stay at the [facility], and has been educated on the anticholinergic effects of Hydroxyzine , especially in the context of increased age, he verbalizes understanding.  CSW has coordinated discharge follow up appointments on the outpatient.  Patient assessed by writer prior to discharge and presents with a euthymic mood, attention to personal hygiene and grooming is fair, eye contact is good, speech is clear & coherent. Thought contents are organized and logical, and pt currently denies SI/HI/AVH or paranoia. There is no evidence of delusional thoughts.  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 He denies any intent or plan to harm himself or any one else.  [place] Suicide Risk assessment:  1. Do you wish to be dead? NO 2. Have you wished your dead or wished you could go to sleep and not wake up?  NO 3.  Have you actually had thoughts of killing yourself?   NO 4.  Have you been thinking about how  you might do this?   NO 5.  Have you had these thoughts and some intention of acting on them?  NO 6.  Have you started to work out or worked out the details to kill yourself?  NO 7.  Do you intend to carry out this plan?  NO 8. On a scale of 1-5 with 1 being the least severe and 5 being the most severe answer the following questions place for intensity of ideation. ZERO 9. How many times have you had these thoughts? NO 10. When you have the thoughts how long to the last?  NO 11. Control ability.  Could you or can you stop thinking about killing herself or wanting to die if you want to?  YES 12. Are there any things anyone or anything family religion pain of death that stop you from wanting to die or acting on thoughts of committing suicide?  FAMILY 13.  What sort of reason to do have to think about wanting to die or killing yourself? NONE  14. Have you done anything, started to do anything,  or prepared to do anything to end your life? Examples: Took pills, tried to shoot yourself, cut yourself, tried to hang yourself,  took out pills but didn't swallow any, held a gun but changed your mind or it was  grabbed from your hand, went to the roof but didn't jump, collected pills, obtained  a gun, gave away valuables, wrote a will or suicide note, etc. NO.  Suicide Risk: Minimal: No identifiable suicidal ideation.  Patients presenting with no risk factors but with morbid ruminations; may be classified as minimal risk based on the severity of the depressive symptoms.   Total Time spent with patient: 45 minutes  Tobacco Cessation:  N/A, patient does not currently use tobacco products  Current Medications:  Current Facility-Administered Medications  Medication Dose Route Frequency Provider Last Rate Last Admin   acetaminophen  (TYLENOL ) tablet 650 mg  650 mg Oral Q6H PRN Trudy Carwin, NP       alum & mag hydroxide-simeth (MAALOX/MYLANTA) 200-200-20 MG/5ML suspension 30 mL  30 mL Oral Q4H PRN Trudy Carwin, NP       atorvastatin  (LIPITOR) tablet 40 mg  40 mg Oral Q2000 Cole Kandi BROCKS, MD   40 mg at 06/02/24 2124   DULoxetine  (CYMBALTA ) DR capsule 60 mg  60 mg Oral Daily Jontay Maston, NP   60 mg at 06/03/24 0944    folic acid  (FOLVITE ) tablet 1 mg  1 mg Oral Daily Honora Searson, Donia, NP   1 mg at 06/03/24 0945   gabapentin  (NEURONTIN ) capsule 600 mg  600 mg Oral TID Tex Donia, NP   600 mg at 06/03/24 9055   hydrOXYzine  (ATARAX ) tablet 25 mg  25 mg Oral TID PRN Tex Donia, NP   25 mg at 06/03/24 0944   magnesium  citrate solution 1 Bottle  1 Bottle Oral Once Shoua Ulloa, NP       magnesium  hydroxide (MILK OF MAGNESIA) suspension 30 mL  30 mL Oral Daily PRN Trudy Carwin, NP       magnesium  oxide (MAG-OX) tablet 400 mg  400 mg Oral BID Tex Donia, NP   400 mg at 06/03/24 0944   melatonin tablet 3 mg  3 mg Oral QHS Bethea, Terrence C, MD   3 mg at 06/02/24 2125   multivitamin with minerals tablet 1 tablet  1 tablet Oral Daily Tex Donia, NP   1 tablet at 06/03/24 0944   naltrexone  (  DEPADE) tablet 50 mg  50 mg Oral Daily Tex Drilling, NP   50 mg at 06/03/24 0945   OLANZapine  (ZYPREXA ) injection 10 mg  10 mg Intramuscular TID PRN Trudy Carwin, NP       OLANZapine  (ZYPREXA ) injection 5 mg  5 mg Intramuscular TID PRN Trudy Carwin, NP       OLANZapine  zydis (ZYPREXA ) disintegrating tablet 5 mg  5 mg Oral TID PRN Trudy Carwin, NP       pantoprazole  (PROTONIX ) EC tablet 40 mg  40 mg Oral Daily Alyha Marines, Drilling, NP   40 mg at 06/03/24 0944   QUEtiapine  (SEROQUEL ) tablet 100 mg  100 mg Oral QHS Tex Drilling, NP   100 mg at 06/02/24 2123   thiamine  (VITAMIN B1) tablet 100 mg  100 mg Oral Daily Cole Salles C, MD   100 mg at 06/03/24 0945   traZODone  (DESYREL ) tablet 100 mg  100 mg Oral QHS Tex Drilling, NP   100 mg at 06/02/24 2125   Current Outpatient Medications  Medication Sig Dispense Refill   Cholecalciferol  (VITAMIN D -3 PO) Take 1 capsule by mouth daily.     folic acid  (FOLVITE ) 1 MG tablet Take 1 tablet (1 mg total) by mouth daily. 30 tablet 0   Multiple Vitamin (MULTIVITAMIN WITH MINERALS) TABS tablet Take 1 tablet by mouth daily.     atorvastatin  (LIPITOR) 40 MG tablet Take 1  tablet (40 mg total) by mouth daily. 30 tablet 0   [START ON 06/04/2024] DULoxetine  (CYMBALTA ) 60 MG capsule Take 1 capsule (60 mg total) by mouth daily. 30 capsule 0   gabapentin  (NEURONTIN ) 300 MG capsule Take 2 capsules (600 mg total) by mouth 3 (three) times daily. 180 capsule 0   hydrOXYzine  (ATARAX ) 25 MG tablet Take 1 tablet (25 mg total) by mouth 3 (three) times daily as needed for anxiety. 30 tablet 0   magnesium  oxide (MAG-OX) 400 (240 Mg) MG tablet Take 1 tablet (400 mg total) by mouth 2 (two) times daily. 60 tablet 0   melatonin 3 MG TABS tablet Take 1 tablet (3 mg total) by mouth at bedtime.     naltrexone  (DEPADE) 50 MG tablet Take 1 tablet (50 mg total) by mouth daily. 30 tablet 0   pantoprazole  (PROTONIX ) 40 MG tablet Take 1 tablet (40 mg total) by mouth daily. 30 tablet 0   QUEtiapine  (SEROQUEL ) 100 MG tablet Take 1 tablet (100 mg total) by mouth at bedtime. 30 tablet 0   traZODone  (DESYREL ) 100 MG tablet Take 1 tablet (100 mg total) by mouth at bedtime. 30 tablet 0    PTA Medications:  Facility Ordered Medications  Medication   acetaminophen  (TYLENOL ) tablet 650 mg   alum & mag hydroxide-simeth (MAALOX/MYLANTA) 200-200-20 MG/5ML suspension 30 mL   magnesium  hydroxide (MILK OF MAGNESIA) suspension 30 mL   OLANZapine  zydis (ZYPREXA ) disintegrating tablet 5 mg   OLANZapine  (ZYPREXA ) injection 5 mg   OLANZapine  (ZYPREXA ) injection 10 mg   [COMPLETED] thiamine  (VITAMIN B1) injection 100 mg   thiamine  (VITAMIN B1) tablet 100 mg   [EXPIRED] LORazepam  (ATIVAN ) tablet 1 mg   [EXPIRED] hydrOXYzine  (ATARAX ) tablet 25 mg   [EXPIRED] loperamide  (IMODIUM ) capsule 2-4 mg   [EXPIRED] ondansetron  (ZOFRAN -ODT) disintegrating tablet 4 mg   [EXPIRED] LORazepam  (ATIVAN ) tablet 1 mg   Followed by   [COMPLETED] LORazepam  (ATIVAN ) tablet 1 mg   Followed by   [COMPLETED] LORazepam  (ATIVAN ) tablet 1 mg   Followed by   [COMPLETED] LORazepam  (ATIVAN ) tablet  1 mg   [COMPLETED] LORazepam   (ATIVAN ) injection 2 mg   DULoxetine  (CYMBALTA ) DR capsule 60 mg   pantoprazole  (PROTONIX ) EC tablet 40 mg   folic acid  (FOLVITE ) tablet 1 mg   naltrexone  (DEPADE) tablet 50 mg   gabapentin  (NEURONTIN ) capsule 600 mg   multivitamin with minerals tablet 1 tablet   [COMPLETED] potassium chloride  SA (KLOR-CON  M) CR tablet 40 mEq   QUEtiapine  (SEROQUEL ) tablet 100 mg   traZODone  (DESYREL ) tablet 100 mg   melatonin tablet 3 mg   magnesium  citrate solution 1 Bottle   magnesium  oxide (MAG-OX) tablet 400 mg   hydrOXYzine  (ATARAX ) tablet 25 mg   [COMPLETED] hydrOXYzine  (ATARAX ) tablet 50 mg   atorvastatin  (LIPITOR) tablet 40 mg   PTA Medications  Medication Sig   Cholecalciferol  (VITAMIN D -3 PO) Take 1 capsule by mouth daily.   folic acid  (FOLVITE ) 1 MG tablet Take 1 tablet (1 mg total) by mouth daily.   Multiple Vitamin (MULTIVITAMIN WITH MINERALS) TABS tablet Take 1 tablet by mouth daily.   [START ON 06/04/2024] DULoxetine  (CYMBALTA ) 60 MG capsule Take 1 capsule (60 mg total) by mouth daily.   hydrOXYzine  (ATARAX ) 25 MG tablet Take 1 tablet (25 mg total) by mouth 3 (three) times daily as needed for anxiety.   melatonin 3 MG TABS tablet Take 1 tablet (3 mg total) by mouth at bedtime.   QUEtiapine  (SEROQUEL ) 100 MG tablet Take 1 tablet (100 mg total) by mouth at bedtime.   traZODone  (DESYREL ) 100 MG tablet Take 1 tablet (100 mg total) by mouth at bedtime.   magnesium  oxide (MAG-OX) 400 (240 Mg) MG tablet Take 1 tablet (400 mg total) by mouth 2 (two) times daily.   gabapentin  (NEURONTIN ) 300 MG capsule Take 2 capsules (600 mg total) by mouth 3 (three) times daily.   naltrexone  (DEPADE) 50 MG tablet Take 1 tablet (50 mg total) by mouth daily.   pantoprazole  (PROTONIX ) 40 MG tablet Take 1 tablet (40 mg total) by mouth daily.   atorvastatin  (LIPITOR) 40 MG tablet Take 1 tablet (40 mg total) by mouth daily.       06/03/2024   11:03 AM 05/27/2024   10:26 PM 04/22/2024    1:43 PM  Depression screen  PHQ 2/9  Decreased Interest 1 3 0  Down, Depressed, Hopeless 1 3 0  PHQ - 2 Score 2 6 0  Altered sleeping 1 0 0  Tired, decreased energy 1 0 0  Change in appetite 0 0 0  Feeling bad or failure about yourself  1 3 0  Trouble concentrating 0 3 0  Moving slowly or fidgety/restless 0 0 0  Suicidal thoughts 0 0 0  PHQ-9 Score 5 12 0  Difficult doing work/chores Somewhat difficult  Not difficult at all    Flowsheet Row ED from 05/27/2024 in [facility] Most recent reading at 05/27/2024 11:10 PM ED from 05/27/2024 in [facility] Most recent reading at 05/27/2024  6:22 PM ED to Hosp-Admission (Discharged) from 05/20/2024 in [place] [facility] Most recent reading at 05/20/2024 10:08 AM  C-SSRS RISK CATEGORY No Risk No Risk No Risk    Musculoskeletal  Strength & Muscle Tone: within normal limits Gait & Station: normal Patient leans: N/A  Psychiatric Specialty Exam  Presentation  General Appearance:  Appropriate for Environment; Fairly Groomed  Eye Contact: Fair  Speech: Clear and Coherent  Speech Volume: Normal  Handedness: Right   Mood and Affect  Mood:  Euthymic  Affect: Appropriate; Congruent   Thought Process  Thought Processes: Coherent  Descriptions of Associations:Intact  Orientation:Full (Time, Place and Person)  Thought Content:Logical  Diagnosis of Schizophrenia or Schizoaffective disorder in past: No    Hallucinations:Hallucinations: None  Ideas of Reference:None  Suicidal Thoughts:Suicidal Thoughts: No  Homicidal Thoughts:Homicidal Thoughts: No   Sensorium  Memory: Immediate Fair  Judgment: Good  Insight: Good   Executive Functions  Concentration: Fair  Attention Span: Good  Recall: Good  Fund of Knowledge: Fair  Language: Good   Psychomotor Activity  Psychomotor Activity: Psychomotor Activity: Normal   Assets  Assets: Desire for  Improvement   Sleep  Sleep: Sleep: Fair  Estimated Sleeping Duration (Last 24 Hours): 10.00 hours  No data recorded  Physical Exam  Physical Exam Vitals and nursing note reviewed.  Neurological:     General: No focal deficit present.     Mental Status: He is oriented to person, place, and time.    Review of Systems  Psychiatric/Behavioral:  Positive for depression (stable) and substance abuse (States that will not be going to rehab-not interested at this time). Negative for hallucinations, memory loss and suicidal ideas. The patient is nervous/anxious (stable) and has insomnia (stable).    Blood pressure 116/76, pulse 98, temperature 97.8 F (36.6 C), temperature source Oral, resp. rate 16, SpO2 96%. There is no height or weight on file to calculate BMI.  Demographic Factors:  Male  Loss Factors: NA  Historical Factors: Family history of mental illness or substance abuse  Risk Reduction Factors:   Sense of responsibility to family, Employed, and Positive social support  Continued Clinical Symptoms:  Alcohol/Substance Abuse/Dependencies  Cognitive Features That Contribute To Risk:  None    Plan Of Care/Follow-up recommendations:  You have a follow up appointment with your primary care provider at [facility] On Monday July 28, @ 3:30.   [facility] [address] [place], [place] [address]  [phone]  Based on the information that you have provided and the presenting issues outpatient services and resources for have been recommended.  It is imperative that you follow through with treatment recommendations within 5-7 days from the of discharge to mitigate further risk to your safety and mental well-being. A list of referrals has been provided below to get you started.  You are not limited to the list provided.  In case of an urgent crisis, you may contact the Mobile Crisis Unit with  [facility] at [phone].   Disposition: Discharged home, and states that wife will be picking him up.   Donia Snell, NP 06/03/2024, 11:58 AM

## 2024-06-03 NOTE — ED Notes (Signed)
 Patient reported getting better. He sleeps well. Eats adequate. Denied SI/H, tremor, and voice hearing or visual hallucination. He said, some times I still have N/V. Med compliant He is calm and quiet. Nothing strange noted at this time.

## 2024-06-03 NOTE — ED Notes (Signed)
 Kimarion is sleeping

## 2024-06-03 NOTE — Discharge Instructions (Addendum)
 You have a follow up appointment with your primary care provider at [facility] On Monday July 28, @ 3:30.   [facility] [address] [place], [place] [address]  [phone]  Based on the information that you have provided and the presenting issues outpatient services and resources for have been recommended.  It is imperative that you follow through with treatment recommendations within 5-7 days from the of discharge to mitigate further risk to your safety and mental well-being. A list of referrals has been provided below to get you started.  You are not limited to the list provided.  In case of an urgent crisis, you may contact the Mobile Crisis Unit with [facility] at [phone].                               Non-Emergent / Urgent   [facility] [address] SECOND FLOOR [place], [place] [address] [phone] OUTPATIENT Walk-in information: Please note, all walk-ins are first come & first serve, with limited number of availability.   Please note that to be eligible for services you must bring: ID or a piece of mail with your name [facility] address   Therapist for therapy:  Monday & Wednesdays: Please ARRIVE at 7:15 AM for registration Will START at 8:00 AM Every 1st & 2nd Friday of the month: Please ARRIVE at 10:15 AM for registration Will START at 1 PM - 5 PM   Psychiatrist for medication management: Monday - Friday:  Please ARRIVE at 7:15 AM for registration Will START at 8:00 AM   Regretfully, due to limited availability, please be aware that you may not been seen on the same day as walk-in. Please consider making an appoint or try again. Thank you for your patience and understanding. ________________________________________________________   [facility]:  [address] FIRST FLOOR.   [place], [place] [address].  [phone]

## 2024-06-10 ENCOUNTER — Inpatient Hospital Stay: Payer: Self-pay

## 2024-06-12 ENCOUNTER — Inpatient Hospital Stay: Payer: MEDICAID

## 2024-07-12 ENCOUNTER — Telehealth (HOSPITAL_COMMUNITY): Payer: MEDICAID | Admitting: Family

## 2024-07-16 ENCOUNTER — Other Ambulatory Visit: Payer: Self-pay

## 2024-07-16 ENCOUNTER — Ambulatory Visit: Payer: Self-pay

## 2024-07-16 NOTE — Telephone Encounter (Signed)
 Copied from CRM [id number]. Topic: Clinical - Medication Refill >> Jul 16, 2024  2:21 PM Fredrica W wrote: Medication:  traZODone  (DESYREL ) 100 MG tablet QUEtiapine  (SEROQUEL ) 100 MG tablet  Has the patient contacted their pharmacy? Yes (Agent: If no, request that the patient contact the pharmacy for the refill. If patient does not wish to contact the pharmacy document the reason why and proceed with request.) (Agent: If yes, when and what did the pharmacy advise?)  This is the patient's preferred pharmacy:  [id number] - [place] - [address]  ([place]) [place] [address] Phone: [phone] Fax: [phone]  Is this the correct pharmacy for this prescription? Yes If no, delete pharmacy and type the correct one.   Has the prescription been filled recently? Yes  Is the patient out of the medication? No  Has the patient been seen for an appointment in the last year OR does the patient have an upcoming appointment? Yes  Can we respond through MyChart? No  Agent: Please be advised that Rx refills may take up to 3 business days. We ask that you follow-up with your pharmacy.

## 2024-07-16 NOTE — Telephone Encounter (Signed)
 Called pt -who stated his depression meds are no longer working. Denied harming self or others. He's requesting an appt on a Friday d/t transportation-no available appts this Friday. Appt given 9/12 Friday w/Dr Charmayne. Instructed pt to go to the ER if his depression worsens; pt stated ok.

## 2024-07-16 NOTE — Telephone Encounter (Signed)
 FYI Only or Action Required?: Action required by provider: request for appointment.  Patient was last seen in primary care on 02/07/2024 by Gabino Boga, MD.  Called Nurse Triage reporting Depression.  Symptoms began several days ago.  Interventions attempted: Prescription medications: trazodone  and Seroquel .  Symptoms are: gradually worsening.  Triage Disposition: See Physician Within 24 Hours  Patient/caregiver understands and will follow disposition?: yes  Copied from CRM #[id number]. Topic: Clinical - Red Word Triage >> Jul 16, 2024  2:24 PM Fredrica W wrote: Red Word that prompted transfer to Nurse Triage: depression and anxiety been higher - medication not working    ----------------------------------------------------------------------- From previous Reason for Contact - Scheduling: Patient/patient representative is calling to schedule an appointment. Refer to attachments for appointment information. Reason for Disposition  [1] Depression AND [2] getting worse (e.g., sleeping poorly, less able to do activities of daily living)  Answer Assessment - Initial Assessment Questions 1. CONCERN: What happened that made you call today?     Depression and anxiety medicine is not working 2. DEPRESSION SYMPTOM SCREENING: How are you feeling overall? (e.g., decreased energy, increased sleeping or difficulty sleeping, difficulty concentrating, feelings of sadness, guilt, hopelessness, or worthlessness)     Cloudy head, hard time sleeping 3. RISK OF HARM - SUICIDAL IDEATION:  Do you ever have thoughts of hurting or killing yourself?  (e.g., yes, no, no but preoccupation with thoughts about death)     denies 4. RISK OF HARM - HOMICIDAL IDEATION:  Do you ever have thoughts of hurting or killing someone else?  (e.g., yes, no, no but preoccupation with thoughts about death)     denies 5. FUNCTIONAL IMPAIRMENT: How have things been going for you overall? Have you had more difficulty  than usual doing your normal daily activities?  (e.g., better, same, worse; self-care, school, work, interactions)     Functioning normally 6. SUPPORT: Who is with you now? Who do you live with? Do you have family or friends who you can talk to?      States good support at home 7. THERAPIST: Do you have a counselor or therapist? If Yes, ask: What is their name?     denies 8. STRESSORS: Has there been any new stress or recent changes in your life?     denies 9. ALCOHOL USE OR SUBSTANCE USE (DRUG USE): Do you drink alcohol or use any illegal drugs?     denies 10. OTHER: Do you have any other physical symptoms right now? (e.g., fever)       Denies Routing to clinic for appt scheduling.  Protocols used: Depression-A-AH

## 2024-07-19 ENCOUNTER — Other Ambulatory Visit: Payer: Self-pay

## 2024-07-21 MED ORDER — TRAZODONE HCL 100 MG PO TABS: 100.0000 mg | ORAL_TABLET | Freq: Every day | ORAL | 0 refills | Status: DC

## 2024-07-21 MED ORDER — QUETIAPINE FUMARATE 100 MG PO TABS: 100.0000 mg | ORAL_TABLET | Freq: Every day | ORAL | 0 refills | Status: DC

## 2024-07-22 ENCOUNTER — Ambulatory Visit (HOSPITAL_COMMUNITY): Payer: MEDICAID | Admitting: Psychiatry

## 2024-07-26 ENCOUNTER — Ambulatory Visit: Payer: MEDICAID

## 2024-07-26 ENCOUNTER — Other Ambulatory Visit: Payer: Self-pay

## 2024-07-26 VITALS — BP 108/65 | HR 78 | Temp 98.3°F | Ht 68.5 in | Wt 173.0 lb

## 2024-07-26 DIAGNOSIS — R1319 Other dysphagia: Secondary | ICD-10-CM | POA: Diagnosis not present

## 2024-07-26 DIAGNOSIS — F411 Generalized anxiety disorder: Secondary | ICD-10-CM | POA: Diagnosis not present

## 2024-07-26 DIAGNOSIS — G4700 Insomnia, unspecified: Secondary | ICD-10-CM | POA: Diagnosis not present

## 2024-07-26 DIAGNOSIS — Z79899 Other long term (current) drug therapy: Secondary | ICD-10-CM

## 2024-07-26 DIAGNOSIS — F1010 Alcohol abuse, uncomplicated: Secondary | ICD-10-CM

## 2024-07-26 DIAGNOSIS — Z1211 Encounter for screening for malignant neoplasm of colon: Secondary | ICD-10-CM

## 2024-07-26 DIAGNOSIS — F17210 Nicotine dependence, cigarettes, uncomplicated: Secondary | ICD-10-CM

## 2024-07-26 MED ORDER — TRAZODONE HCL 100 MG PO TABS: 100.0000 mg | ORAL_TABLET | Freq: Every day | ORAL | 0 refills | Status: DC

## 2024-07-26 MED ORDER — QUETIAPINE FUMARATE 50 MG PO TABS: ORAL_TABLET | ORAL | 0 refills | Status: DC

## 2024-07-26 MED ORDER — HYDROXYZINE HCL 25 MG PO TABS: 25.0000 mg | ORAL_TABLET | Freq: Three times a day (TID) | ORAL | 0 refills | Status: DC | PRN

## 2024-07-26 MED ORDER — DULOXETINE HCL 60 MG PO CPEP: 60.0000 mg | ORAL_CAPSULE | Freq: Every day | ORAL | 0 refills | Status: DC

## 2024-07-26 MED ORDER — PANTOPRAZOLE SODIUM 40 MG PO TBEC: 40.0000 mg | DELAYED_RELEASE_TABLET | Freq: Every day | ORAL | 0 refills | Status: DC

## 2024-07-26 NOTE — Patient Instructions (Addendum)
 It was wonderful seeing you today!   Things we discussed.SABRASABRA  1) Changing your Seroquel  dose, you can take 50 mg (1 tablet) in the morning as needed for daytime anxiousness. Take 150 mg (3 tablets) at night for sleep.   2) Please do not take more than 25 mg hydroxyzine  every 8 hours as needed for anxiety. Please let us  know if you feel like you need more before taking more.   3) Continue trazodone  100 mg at night.   4) Referral for colonoscopy and behavioral health have been placed. Be on the lookout for a phone call from both of them to have these appointments scheduled.   5) KEEP UP THE GOOD WORK!  And don't forget to get your flu shot before the end of October!  If you have any questions please feel free to the call the clinic at anytime at [phone].  Have a blessed day,  Dr. Charmayne

## 2024-07-26 NOTE — Assessment & Plan Note (Signed)
 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  Plan to continue pantoprazole  40 mg daily. Orders:   pantoprazole  (PROTONIX ) 40 MG tablet; Take 1 tablet (40 mg total) by mouth daily.

## 2024-07-26 NOTE — Progress Notes (Signed)
 Established Patient Office Visit  Subjective   Patient ID: Xavier Owens, male    DOB: 1971-02-16  Age: 53 y.o. MRN: [id number]  Patient here for a 42-month follow-up, he has been doing well and has seen psychiatry and neurology as well as been to the ED since we have last seen him.  He was last in the ED for alcohol withdrawal symptoms on July/14.  He was treated with Ativan .  Patient says he has abstained from alcohol since then and that he was in a dark place for a few months but has been doing really well.  He has a good job at [employer] and has a strict schedule that he sticks to which helps him not drink.  He gets up and drinks his coffee, watches the news, goes for a 4 to 6 mile walk, comes home eats breakfast, goes to work and gets home around 5.  He then eats dinner and watches TV to go to bed. It is very important for him to stick to his routine. He has been out of his trazodone  and has been really struggling to fall asleep.  He would like to increase his trazodone  and quetiapine .  He has been taking 50 mg of hydroxyzine  instead of 25.  Depression        Past medical history includes anxiety.   Hypertension Associated symptoms include anxiety.  Anxiety      Patient Active Problem List   Diagnosis Date Noted   History of seizure due to alcohol withdrawal 05/30/2024   Gastroesophageal reflux disease without esophagitis 05/30/2024   Hyperlipidemia 05/30/2024   Schatzki's ring 05/23/2024   Encounter to discuss test results 05/22/2024   Non-cardiac chest pain 05/22/2024   Esophageal dysphagia 05/22/2024   Abnormal loss of weight 05/22/2024   Abnormal finding on GI tract imaging 05/22/2024   Chest pain 05/21/2024   Elevated troponin 05/21/2024   Hypotension 05/21/2024   Chest discomfort 05/20/2024   Electrolyte abnormality 05/20/2024   Alcohol withdrawal (HCC) 05/09/2024   AKI (acute kidney injury) (HCC) 05/09/2024   Leukocytosis 05/09/2024   Heat exhaustion, water  deprivation 05/09/2024   Major depression, recurrent (HCC) 04/03/2024   Reactive depression 03/22/2024   Alcohol withdrawal syndrome with complication (HCC) 03/22/2024   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   Dehydration 03/21/2024   Acute lactic acidosis 03/21/2024   Delirium tremens (HCC) 03/18/2024   Nausea & vomiting 03/16/2024   Alcohol use disorder 03/16/2024   Tremor 03/16/2024   Neuropathy 03/16/2024   Mood disorder (HCC) 03/16/2024   Memory impairment 02/08/2024   Right knee pain 02/08/2024   Major depressive disorder, recurrent severe without psychotic features (HCC) 01/25/2024   Alcohol withdrawal seizure (HCC) 01/23/2024   Alcohol use disorder, severe, dependence (HCC) 01/18/2024   Healthcare maintenance 06/10/2022   Nightmares 12/21/2021   Subclinical hypothyroidism 12/21/2021   Substance induced mood disorder (HCC) 12/14/2021   Alcohol use disorder, moderate, dependence (HCC) 06/23/2021   Seizures (HCC) 06/22/2021   Elevated liver enzymes 04/19/2016   Essential hypertension 04/19/2016        Objective:     BP 108/65 (BP Location: Right Arm, Patient Position: Sitting)   Pulse 78   Temp 98.3 F (36.8 C) (Oral)   Ht 5' 8.5 (1.74 m)   Wt 173 lb (78.5 kg)   SpO2 100%   BMI 25.92 kg/m  BP Readings from Last 3 Encounters:  07/26/24 108/65  06/03/24 120/76  05/27/24 125/79   Wt Readings from Last 3 Encounters:  07/26/24 173 lb (78.5 kg)  05/20/24 158 lb (71.7 kg)  05/08/24 160 lb (72.6 kg)      Physical Exam Vitals reviewed.  Constitutional:      Appearance: Normal appearance.  HENT:     Nose: Nose normal.     Mouth/Throat:     Mouth: Mucous membranes are moist.     Pharynx: Oropharynx is clear.  Eyes:     Conjunctiva/sclera: Conjunctivae normal.  Cardiovascular:     Rate and Rhythm: Normal rate and regular rhythm.     Heart sounds: Normal heart sounds.  Pulmonary:     Effort: Pulmonary effort is normal.     Breath sounds: Normal breath  sounds.  Abdominal:     General: There is no distension.     Palpations: Abdomen is soft. There is no mass.     Tenderness: There is no abdominal tenderness. There is no guarding.     Hernia: No hernia is present.  Musculoskeletal:        General: No swelling.     Right lower leg: No edema.     Left lower leg: No edema.  Skin:    General: Skin is warm.  Neurological:     General: No focal deficit present.     Mental Status: He is alert and oriented to person, place, and time.  Psychiatric:        Mood and Affect: Mood normal.        Behavior: Behavior normal.      No results found for any visits on 07/26/24.  Last CBC Lab Results  Component Value Date   WBC 8.2 05/27/2024   HGB 13.9 05/27/2024   HCT 41.7 05/27/2024   MCV 85.8 05/27/2024   MCH 28.6 05/27/2024   RDW 14.3 05/27/2024   PLT 346 05/27/2024   Last metabolic panel Lab Results  Component Value Date   GLUCOSE 48 (L) 05/30/2024   NA 134 (L) 05/30/2024   K 3.6 05/30/2024   CL 95 (L) 05/30/2024   CO2 27 05/30/2024   BUN 8 05/30/2024   CREATININE 0.82 05/30/2024   GFRNONAA >60 05/30/2024   CALCIUM  9.6 05/30/2024   PHOS 4.9 (H) 05/24/2024   PROT 7.9 05/27/2024   ALBUMIN 4.2 05/27/2024   LABGLOB 3.2 06/10/2022   AGRATIO 1.4 06/10/2022   BILITOT 0.9 05/27/2024   ALKPHOS 120 05/27/2024   AST 47 (H) 05/27/2024   ALT 24 05/27/2024   ANIONGAP 12 05/30/2024   Last lipids Lab Results  Component Value Date   CHOL 208 (H) 05/20/2024   HDL 79 05/20/2024   LDLCALC 95 05/20/2024   TRIG 168 (H) 05/20/2024   CHOLHDL 2.6 05/20/2024      The 10-year ASCVD risk score (Arnett DK, et al., 2019) is: 7.1%    Assessment & Plan:   Assessment & Plan GAD (generalized anxiety disorder) His PHQ 9 score today is 17, his GAD-7 score today is 21.  Currently taking duloxetine  60 mg daily and hydroxyzine  50 mg 3 times daily.  Patient is pleasant and appears comfortable today on exam.  He does express anxiety in certain  situations, especially when he deviates from his schedule.  Staying on a strict schedule and exercising and going to work helps him avoid alcohol.  He says he gets anxious when his schedule is disrupted. He has been taking hydroxyzine  50 mg 3 times a day because this helps relieve his anxiety despite being prescribed 25 mg 3 times daily.  We  discussed that this is not a safe way to use hydroxyzine  and that there can be long-term consequences of taking too much of this medication for too long. Emphasized the importance of only taking 25 mg of hydroxyzine  every 3 hours as needed.  Instructions given to patient to let us  know if he feels like he needs to take more hydroxyzine  than prescribed.  Discussed the benefit of treating anxiety with medication in conjunction with therapy.  Patient would like to speak to someone and understands that having a behavioral health therapist will augment his medication therapy.  Plan to increase his quetiapine  to 200 mg in total for the day.  Instructions given to patient to take 50 mg as needed in the morning to help with his anxiety.  I hope that this helps him avoid needing as much hydroxyzine .  And 150 mg of quetiapine  at night to help him sleep (discussed under insomnia).  Continue hydroxyzine  25 mg every 8 hours as needed, emphasized to patient to take this as prescribed.  Plan to have patient meet with [facility] for [facility].  Continue duloxetine  60 mg daily. Orders:   Ambulatory referral to Behavioral Health   hydrOXYzine  (ATARAX ) 25 MG tablet; Take 1 tablet (25 mg total) by mouth 3 (three) times daily as needed for anxiety.   QUEtiapine  (SEROQUEL ) 50 MG tablet; Take 1 tablet (50 mg total) by mouth in the morning AND 3 tablets (150 mg total) at bedtime. The morning 50 mg dose can be as needed for days you feel anxiety.   DULoxetine  (CYMBALTA ) 60 MG capsule; Take 1 capsule (60 mg total) by mouth daily.  Insomnia, unspecified type Patient has been out of his trazodone  for about a  week and he has been staying up until 2 AM and really struggling to sleep.  He would like for us  to increase his trazodone  dose.  He gets home from work and eats dinner and immediately goes into his bed to watch TV, he will often wake up and the TV is still on.  We discussed good sleep hygiene such as eating a couple hours before going to bed, avoiding watching TV in bed, avoiding screens an hour before going to sleep, and sleeping in a cold dark room.  Patient does feel like he could make some changes with his sleep hygiene such as eating earlier and not watching TV in his bed and not watching TV to fall asleep.  Also discussed that trazodone  is not a benign drug and can have side effects.  Plan to refill current trazodone  100 mg at bedtime for sleep.  Will increase quetiapine  to 150 mg at night to help aid in sleep.  Patient instructed to take 50 mg of quetiapine  earlier in the day as needed for anxiety and to take 150 mg at night for sleep.  Patient understands. Orders:   traZODone  (DESYREL ) 100 MG tablet; Take 1 tablet (100 mg total) by mouth at bedtime.   QUEtiapine  (SEROQUEL ) 50 MG tablet; Take 1 tablet (50 mg total) by mouth in the morning AND 3 tablets (150 mg total) at bedtime. The morning 50 mg dose can be as needed for days you feel anxiety.  Colon cancer screening Per chart, it does not look like patient has ever had screening.  Patient would like to be scheduled. Orders:   Amb Referral to Colonoscopy  Esophageal dysphagia Patient had surgery on esophageal ring in early July, he states this went well.  Plan to continue pantoprazole  40 mg daily.  Orders:   pantoprazole  (PROTONIX ) 40 MG tablet; Take 1 tablet (40 mg total) by mouth daily.   Return in about 4 weeks (around 08/23/2024) for fu for sleep.    Viktoria King, DO

## 2024-08-01 NOTE — Progress Notes (Signed)
 Internal Medicine Clinic Attending  I was physically present during the key portions of the resident provided service and participated in the medical decision making of patient's management care. I reviewed pertinent patient test results.  The assessment, diagnosis, and plan were formulated together and I agree with the documentation in the resident's note.  Shawn Sick, MD  Complex Psych history, currently follows w/ Psych NP who has been managing his medications. His persistent severe anxiety was his main complaint today and as such, his Seroquel  was uptitrated both for sleep & anxiety. Recommend follow up w/ Psych NP for further titration as pt may need to change or add agents as he is approaching the upper limit of Seroquel  dosing for GAD.

## 2024-08-19 ENCOUNTER — Other Ambulatory Visit: Payer: Self-pay

## 2024-08-19 DIAGNOSIS — F411 Generalized anxiety disorder: Secondary | ICD-10-CM

## 2024-08-19 DIAGNOSIS — G4700 Insomnia, unspecified: Secondary | ICD-10-CM

## 2024-08-19 NOTE — Telephone Encounter (Unsigned)
 Copied from CRM #[id number]. Topic: Clinical - Medication Refill >> Aug 19, 2024 12:16 PM Susanna ORN wrote: Medication: QUEtiapine  (SEROQUEL ) 50 MG tablet & traZODone  (DESYREL ) 100 MG tablet Has the patient contacted their pharmacy? No (Agent: If no, request that the patient contact the pharmacy for the refill. If patient does not wish to contact the pharmacy document the reason why and proceed with request.) (Agent: If yes, when and what did the pharmacy advise?)  This is the patient's preferred pharmacy:  [id number] - [place] ([place]), [place] - [address] [place] ([place]) [place] [address] Phone: [phone] Fax: [phone]  Is this the correct pharmacy for this prescription? Yes If no, delete pharmacy and type the correct one.   Has the prescription been filled recently? Yes  Is the patient out of the medication? Yes  Has the patient been seen for an appointment in the last year OR does the patient have an upcoming appointment? Yes  Can we respond through MyChart? Yes  Agent: Please be advised that Rx refills may take up to 3 business days. We ask that you follow-up with your pharmacy.

## 2024-08-22 ENCOUNTER — Institutional Professional Consult (permissible substitution): Payer: MEDICAID | Admitting: Licensed Clinical Social Worker

## 2024-08-22 MED ORDER — QUETIAPINE FUMARATE 50 MG PO TABS: ORAL_TABLET | ORAL | 0 refills | Status: DC

## 2024-08-22 MED ORDER — TRAZODONE HCL 100 MG PO TABS: 100.0000 mg | ORAL_TABLET | Freq: Every day | ORAL | 0 refills | Status: DC

## 2024-08-29 ENCOUNTER — Institutional Professional Consult (permissible substitution): Payer: MEDICAID | Admitting: Licensed Clinical Social Worker

## 2024-10-18 ENCOUNTER — Encounter (HOSPITAL_COMMUNITY): Payer: Self-pay

## 2024-10-18 ENCOUNTER — Telehealth (HOSPITAL_COMMUNITY): Payer: MEDICAID | Admitting: Psychiatry

## 2024-11-15 ENCOUNTER — Other Ambulatory Visit: Payer: Self-pay

## 2024-11-15 DIAGNOSIS — R1319 Other dysphagia: Secondary | ICD-10-CM

## 2024-11-15 DIAGNOSIS — F411 Generalized anxiety disorder: Secondary | ICD-10-CM

## 2024-11-15 DIAGNOSIS — G4700 Insomnia, unspecified: Secondary | ICD-10-CM

## 2024-11-15 NOTE — Telephone Encounter (Signed)
 Cholecalciferol  (VITAMIN D -3 PO -- historical med

## 2024-11-15 NOTE — Telephone Encounter (Unsigned)
 Copied from CRM [id number]. Topic: Clinical - Medication Refill >> Nov 15, 2024  9:18 AM Miquel SAILOR wrote: Medication: atorvastatin  (LIPITOR) 40 MG tablet Cholecalciferol  (VITAMIN D -3 PO) DULoxetine  (CYMBALTA ) 60 MG capsule folic acid  (FOLVITE ) 1 MG tablet gabapentin  (NEURONTIN ) 300 MG capsule hydrOXYzine  (ATARAX ) 25 MG tablet magnesium  oxide (MAG-OX) 400 (240 Mg) MG tablet melatonin 3 MG TABS tablet Multiple Vitamin (MULTIVITAMIN WITH MINERALS) TABS tablet pantoprazole  (PROTONIX ) 40 MG tablet QUEtiapine  (SEROQUEL ) 50 MG tablet traZODone  (DESYREL ) 100 MG tablet    Has the patient contacted their pharmacy? Yes (Agent: If no, request that the patient contact the pharmacy for the refill. If patient does not wish to contact the pharmacy document the reason why and proceed with request.) (Agent: If yes, when and what did the pharmacy advise?)  This is the patient's preferred pharmacy:  [id number] - [place] ([place]), [place] - [address] [place] ([place]) [place] [address] Phone: [phone] Fax: [phone]  Is this the correct pharmacy for this prescription? Yes If no, delete pharmacy and type the correct one.   Has the prescription been filled recently? Yes  Is the patient out of the medication? Yes  Has the patient been seen for an appointment in the last year OR does the patient have an upcoming appointment? Yes  Can we respond through MyChart? Yes  Agent: Please be advised that Rx refills may take up to 3 business days. We ask that you follow-up with your pharmacy.

## 2024-11-18 MED ORDER — HYDROXYZINE HCL 25 MG PO TABS: 25.0000 mg | ORAL_TABLET | Freq: Three times a day (TID) | ORAL | 0 refills | Status: AC | PRN

## 2024-11-18 MED ORDER — ATORVASTATIN CALCIUM 40 MG PO TABS: 40.0000 mg | ORAL_TABLET | Freq: Every day | ORAL | 0 refills | Status: AC

## 2024-11-18 MED ORDER — FOLIC ACID 1 MG PO TABS: 1.0000 mg | ORAL_TABLET | Freq: Every day | ORAL | 0 refills | Status: AC

## 2024-11-18 MED ORDER — TRAZODONE HCL 100 MG PO TABS: 100.0000 mg | ORAL_TABLET | Freq: Every day | ORAL | 0 refills | Status: AC

## 2024-11-18 MED ORDER — MELATONIN 3 MG PO TABS: 3.0000 mg | ORAL_TABLET | Freq: Every day | ORAL | 0 refills | Status: AC

## 2024-11-18 MED ORDER — PANTOPRAZOLE SODIUM 40 MG PO TBEC: 40.0000 mg | DELAYED_RELEASE_TABLET | Freq: Every day | ORAL | 0 refills | Status: AC

## 2024-11-18 MED ORDER — DULOXETINE HCL 60 MG PO CPEP: 60.0000 mg | ORAL_CAPSULE | Freq: Every day | ORAL | 0 refills | Status: AC

## 2024-11-18 MED ORDER — ADULT MULTIVITAMIN W/MINERALS CH: 1.0000 | ORAL_TABLET | Freq: Every day | ORAL | 0 refills | Status: DC

## 2024-11-18 MED ORDER — GABAPENTIN 300 MG PO CAPS: 600.0000 mg | ORAL_CAPSULE | Freq: Three times a day (TID) | ORAL | 0 refills | Status: AC

## 2024-11-18 MED ORDER — MAGNESIUM OXIDE -MG SUPPLEMENT 400 (240 MG) MG PO TABS: 400.0000 mg | ORAL_TABLET | Freq: Two times a day (BID) | ORAL | 0 refills | Status: AC

## 2024-11-18 MED ORDER — ADULT MULTIVITAMIN W/MINERALS CH: 1.0000 | ORAL_TABLET | Freq: Every day | ORAL | 0 refills | Status: AC

## 2024-11-18 MED ORDER — QUETIAPINE FUMARATE 50 MG PO TABS: ORAL_TABLET | ORAL | 0 refills | Status: AC
